# Patient Record
Sex: Female | Born: 1935
Health system: Southern US, Community
[De-identification: ages and names within clinical notes are randomized; demographics above are authoritative.]

## PROBLEM LIST (undated history)

## (undated) DIAGNOSIS — Z86018 Personal history of other benign neoplasm: Secondary | ICD-10-CM

## (undated) DIAGNOSIS — E785 Hyperlipidemia, unspecified: Secondary | ICD-10-CM

## (undated) DIAGNOSIS — M898X9 Other specified disorders of bone, unspecified site: Secondary | ICD-10-CM

## (undated) DIAGNOSIS — M899 Disorder of bone, unspecified: Secondary | ICD-10-CM

## (undated) DIAGNOSIS — S32020A Wedge compression fracture of second lumbar vertebra, initial encounter for closed fracture: Secondary | ICD-10-CM

## (undated) DIAGNOSIS — E7800 Pure hypercholesterolemia, unspecified: Secondary | ICD-10-CM

## (undated) DIAGNOSIS — M1711 Unilateral primary osteoarthritis, right knee: Secondary | ICD-10-CM

## (undated) DIAGNOSIS — I2510 Atherosclerotic heart disease of native coronary artery without angina pectoris: Secondary | ICD-10-CM

## (undated) DIAGNOSIS — D649 Anemia, unspecified: Secondary | ICD-10-CM

## (undated) DIAGNOSIS — I10 Essential (primary) hypertension: Secondary | ICD-10-CM

## (undated) DIAGNOSIS — M810 Age-related osteoporosis without current pathological fracture: Secondary | ICD-10-CM

## (undated) DIAGNOSIS — H269 Unspecified cataract: Secondary | ICD-10-CM

## (undated) DIAGNOSIS — S32010A Wedge compression fracture of first lumbar vertebra, initial encounter for closed fracture: Secondary | ICD-10-CM

## (undated) DIAGNOSIS — E119 Type 2 diabetes mellitus without complications: Secondary | ICD-10-CM

## (undated) HISTORY — DX: Unspecified cataract: H26.9

## (undated) HISTORY — DX: Hypercalcemia: E83.52

## (undated) HISTORY — DX: Wedge compression fracture of second lumbar vertebra, initial encounter for closed fracture: S32.020A

## (undated) HISTORY — DX: Multiple myeloma not having achieved remission: C90.00

## (undated) HISTORY — PX: ECTOPIC PREGNANCY SURGERY: SHX613

## (undated) HISTORY — DX: Unilateral primary osteoarthritis, right knee: M17.11

## (undated) HISTORY — DX: Pure hypercholesterolemia, unspecified: E78.00

## (undated) HISTORY — DX: Disorder of bone, unspecified: M89.9

## (undated) HISTORY — DX: Age-related osteoporosis without current pathological fracture: M81.0

## (undated) HISTORY — DX: Other specified disorders of bone, unspecified site: M89.8X9

## (undated) HISTORY — DX: Anemia, unspecified: D64.9

## (undated) HISTORY — DX: Personal history of other benign neoplasm: Z86.018

## (undated) HISTORY — DX: Atherosclerotic heart disease of native coronary artery without angina pectoris: I25.10

## (undated) HISTORY — DX: Essential (primary) hypertension: I10

## (undated) HISTORY — DX: Hyperlipidemia, unspecified: E78.5

## (undated) HISTORY — DX: Type 2 diabetes mellitus without complications: E11.9

## (undated) HISTORY — DX: Wedge compression fracture of first lumbar vertebra, initial encounter for closed fracture: S32.010A

## (undated) MED FILL — Iron Sucrose Inj 20 MG/ML (Fe Equiv): INTRAVENOUS | Qty: 10 | Status: AC

---

## 2008-01-14 ENCOUNTER — Ambulatory Visit: Payer: Self-pay | Admitting: Family Medicine

## 2008-01-14 IMAGING — CR DG CHEST 2V
1 series · 2 of 2 positions shown · non-contrast
Comparison: none

REASON FOR EXAM: Pneumonia
COMMENTS:

[Series 1: view not recorded · 0.17mm/px · 2 of 2 slices shown]
[im 1/2]
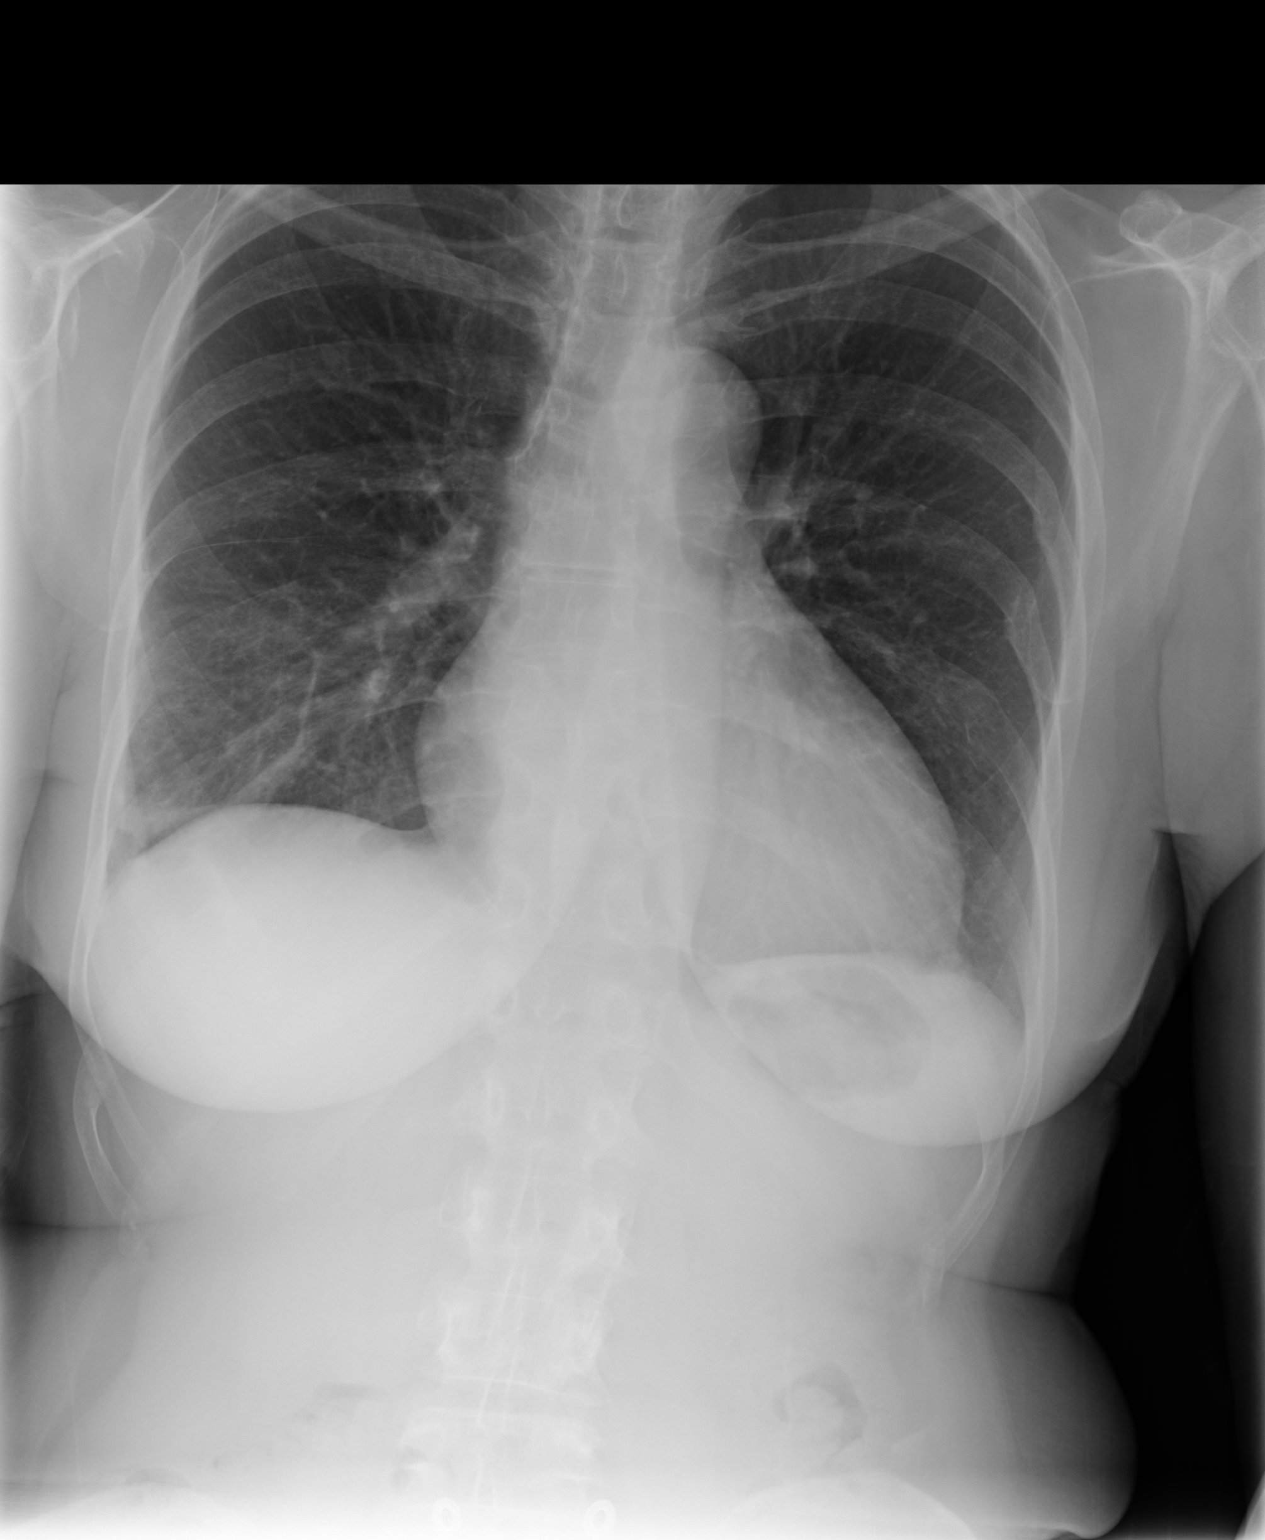
[im 2/2]
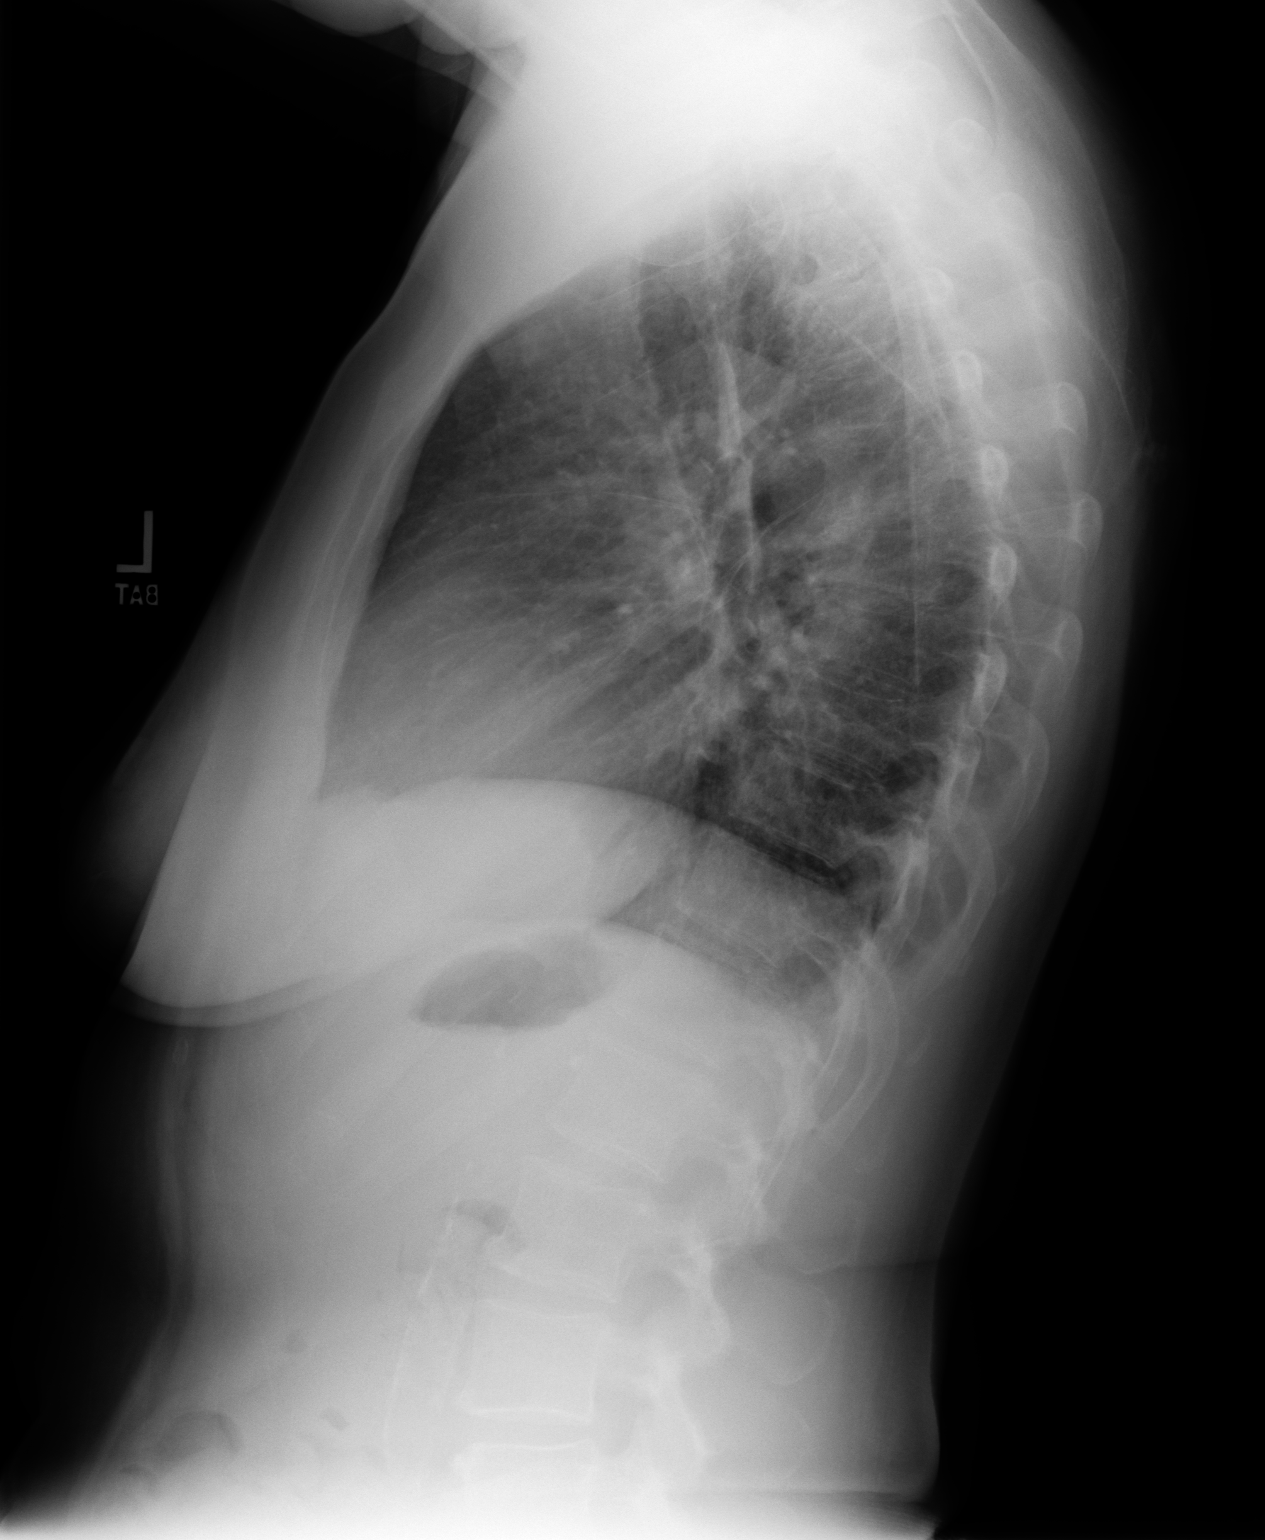

[2 of 2 positions shown; findings below may reference images not displayed]

PROCEDURE:     MDR - MDR CHEST PA(OR AP) AND LATERAL  - [DATE]  [DATE]

RESULT:     There is a minimal increase in density at the RIGHT base
compatible with atelectasis or minimal pneumonia. The LEFT lung field is
clear. The heart appears mildly enlarged. There is no interstitial or
pulmonary edema. Old appearing rib fracture deformities are noted on the
LEFT.
IMPRESSION: 1.  There is minimal atelectasis or infiltrate at the RIGHT base. The lung
fields otherwise are clear.
2.  Mild cardiomegaly.
3.  No pleural effusion or pulmonary edema is seen.
4.  There is deformity of the sixth and seventh, LEFT ribs compatible with
old rib fracture deformities.

## 2008-03-03 ENCOUNTER — Ambulatory Visit: Payer: Self-pay | Admitting: Family Medicine

## 2008-03-03 IMAGING — CR DG CHEST 2V
1 series · 2 of 2 positions shown · non-contrast
Comparison: none

REASON FOR EXAM: pneumonia
COMMENTS:

[Series 1: view not recorded · 0.17mm/px · 2 of 2 slices shown]
[im 1/2]
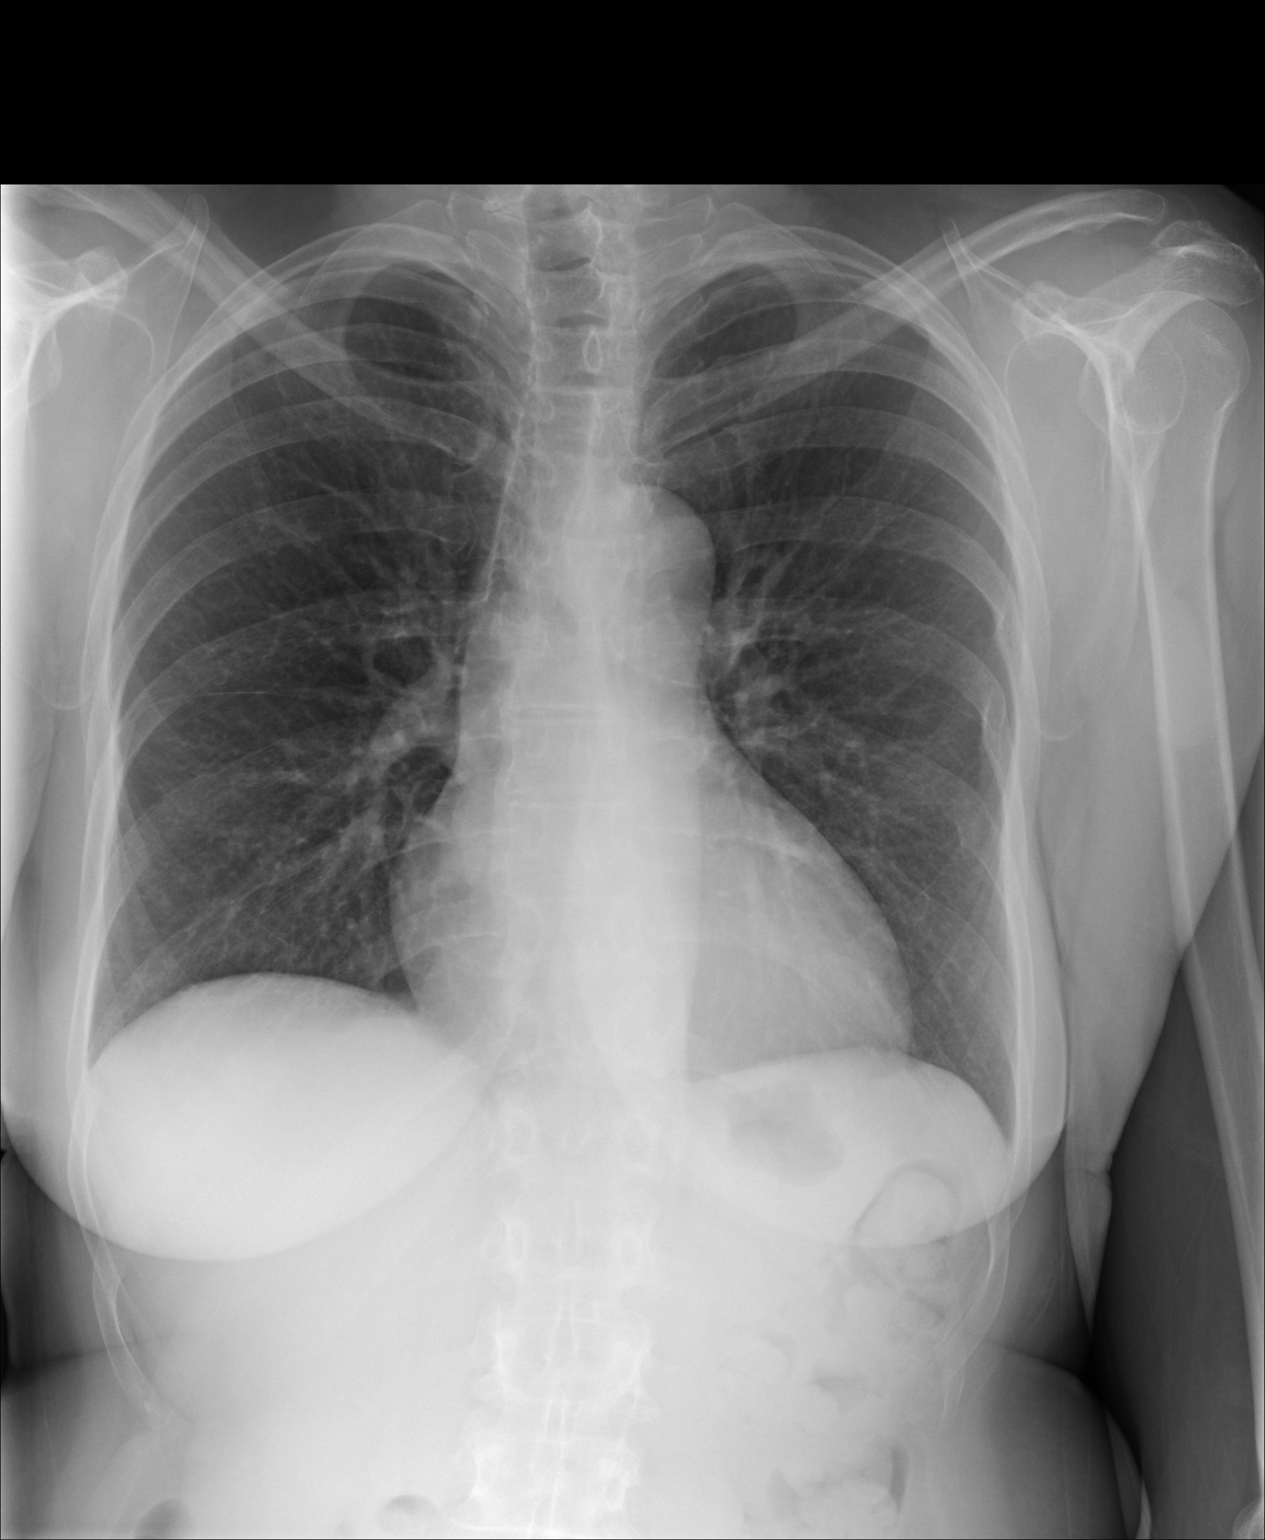
[im 2/2]
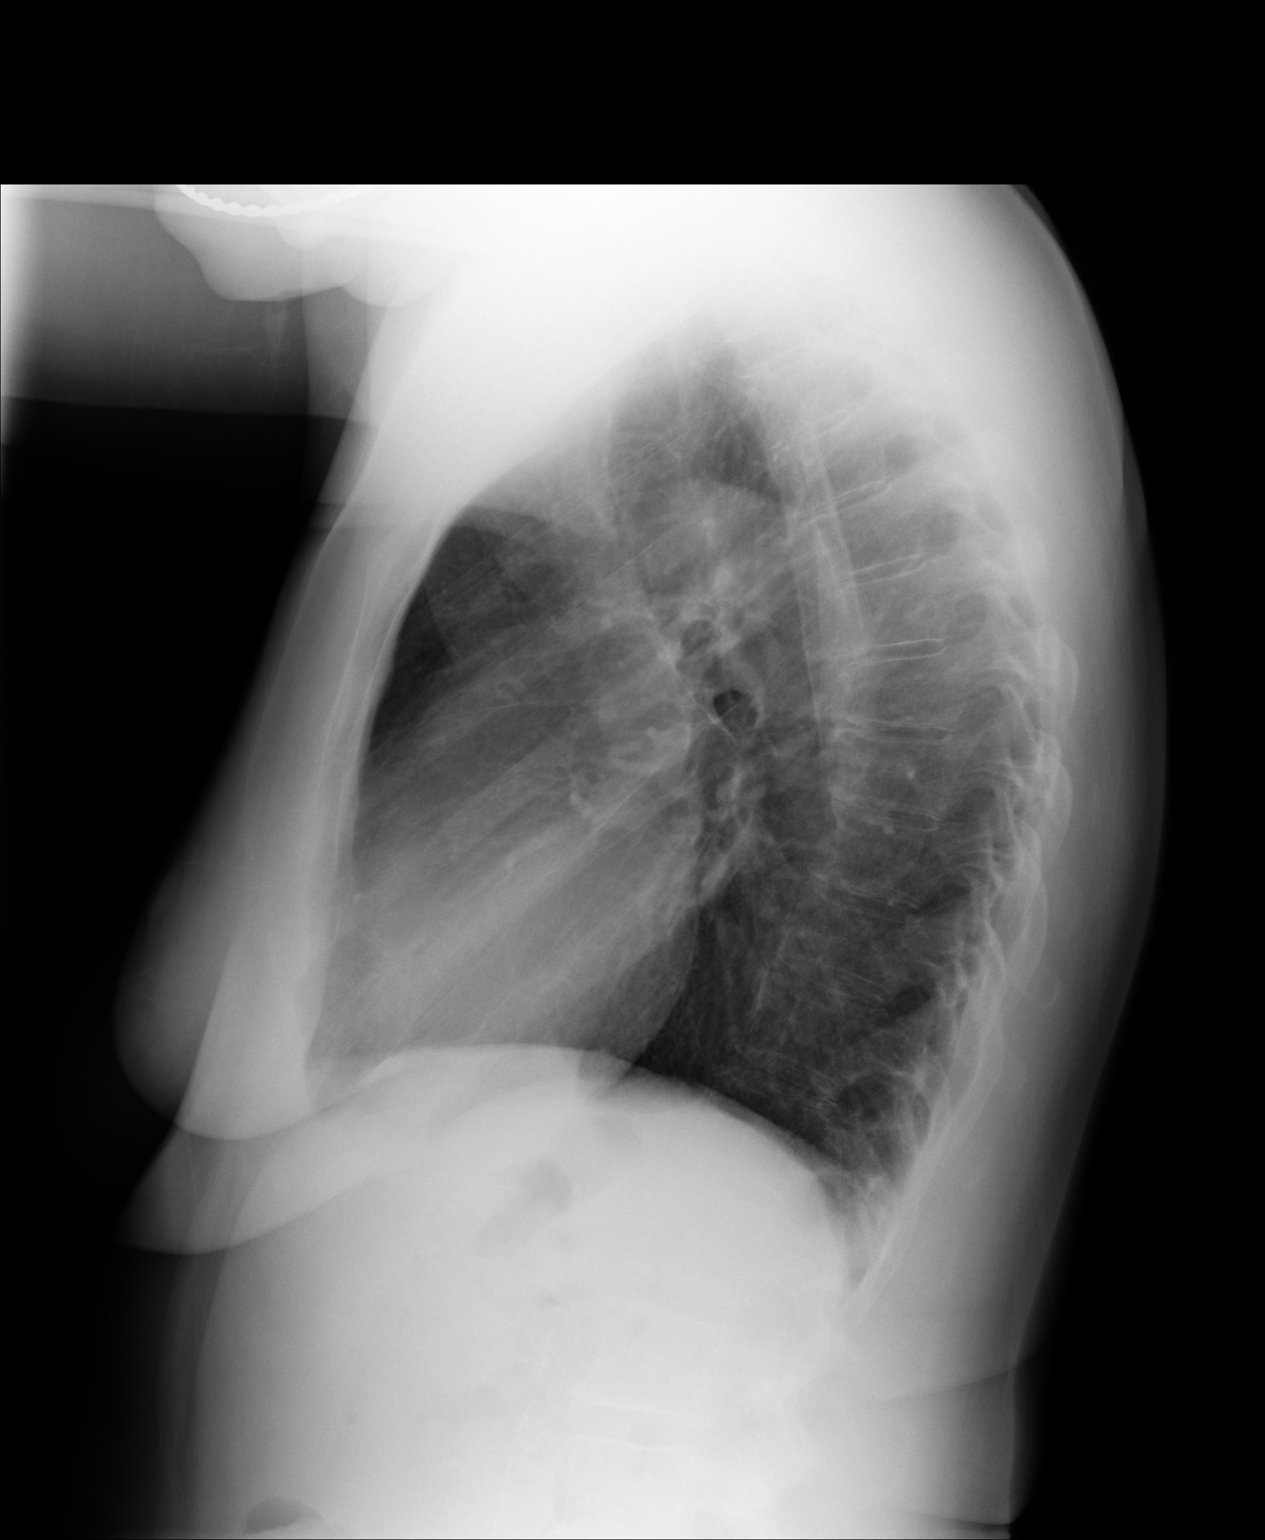

[2 of 2 positions shown; findings below may reference images not displayed]

PROCEDURE:     MDR - MDR CHEST PA(OR AP) AND LATERAL  - [DATE]  [DATE]

RESULT:     Comparison is made to the study [DATE].

The lungs are mildly hyperinflated. There is no focal infiltrate. Old rib
deformity on the LEFT is present. The heart is normal in size. The pulmonary
vascularity is not engorged.
IMPRESSION: I do not see evidence of pneumonia nor other acute cardiopulmonary
abnormality.

## 2008-03-17 DIAGNOSIS — Z8701 Personal history of pneumonia (recurrent): Secondary | ICD-10-CM

## 2008-03-17 HISTORY — DX: Personal history of pneumonia (recurrent): Z87.01

## 2009-03-17 HISTORY — PX: COLONOSCOPY: SHX174

## 2011-03-19 DIAGNOSIS — J209 Acute bronchitis, unspecified: Secondary | ICD-10-CM | POA: Diagnosis not present

## 2011-05-05 DIAGNOSIS — M7150 Other bursitis, not elsewhere classified, unspecified site: Secondary | ICD-10-CM | POA: Diagnosis not present

## 2011-06-25 DIAGNOSIS — M6281 Muscle weakness (generalized): Secondary | ICD-10-CM | POA: Diagnosis not present

## 2011-06-25 DIAGNOSIS — M25659 Stiffness of unspecified hip, not elsewhere classified: Secondary | ICD-10-CM | POA: Diagnosis not present

## 2011-06-25 DIAGNOSIS — M25559 Pain in unspecified hip: Secondary | ICD-10-CM | POA: Diagnosis not present

## 2011-06-25 DIAGNOSIS — M76899 Other specified enthesopathies of unspecified lower limb, excluding foot: Secondary | ICD-10-CM | POA: Diagnosis not present

## 2011-07-08 DIAGNOSIS — E785 Hyperlipidemia, unspecified: Secondary | ICD-10-CM | POA: Diagnosis not present

## 2011-07-08 DIAGNOSIS — I10 Essential (primary) hypertension: Secondary | ICD-10-CM | POA: Diagnosis not present

## 2011-07-08 DIAGNOSIS — E119 Type 2 diabetes mellitus without complications: Secondary | ICD-10-CM | POA: Diagnosis not present

## 2011-07-11 DIAGNOSIS — M6281 Muscle weakness (generalized): Secondary | ICD-10-CM | POA: Diagnosis not present

## 2011-07-11 DIAGNOSIS — M25659 Stiffness of unspecified hip, not elsewhere classified: Secondary | ICD-10-CM | POA: Diagnosis not present

## 2011-07-11 DIAGNOSIS — M76899 Other specified enthesopathies of unspecified lower limb, excluding foot: Secondary | ICD-10-CM | POA: Diagnosis not present

## 2011-07-11 DIAGNOSIS — M25559 Pain in unspecified hip: Secondary | ICD-10-CM | POA: Diagnosis not present

## 2011-07-14 DIAGNOSIS — M76899 Other specified enthesopathies of unspecified lower limb, excluding foot: Secondary | ICD-10-CM | POA: Diagnosis not present

## 2011-07-14 DIAGNOSIS — M25559 Pain in unspecified hip: Secondary | ICD-10-CM | POA: Diagnosis not present

## 2011-07-14 DIAGNOSIS — M6281 Muscle weakness (generalized): Secondary | ICD-10-CM | POA: Diagnosis not present

## 2011-07-14 DIAGNOSIS — M25659 Stiffness of unspecified hip, not elsewhere classified: Secondary | ICD-10-CM | POA: Diagnosis not present

## 2011-07-17 DIAGNOSIS — M25659 Stiffness of unspecified hip, not elsewhere classified: Secondary | ICD-10-CM | POA: Diagnosis not present

## 2011-07-17 DIAGNOSIS — M76899 Other specified enthesopathies of unspecified lower limb, excluding foot: Secondary | ICD-10-CM | POA: Diagnosis not present

## 2011-07-17 DIAGNOSIS — M6281 Muscle weakness (generalized): Secondary | ICD-10-CM | POA: Diagnosis not present

## 2011-07-21 DIAGNOSIS — M6281 Muscle weakness (generalized): Secondary | ICD-10-CM | POA: Diagnosis not present

## 2011-07-21 DIAGNOSIS — M76899 Other specified enthesopathies of unspecified lower limb, excluding foot: Secondary | ICD-10-CM | POA: Diagnosis not present

## 2011-07-21 DIAGNOSIS — M25659 Stiffness of unspecified hip, not elsewhere classified: Secondary | ICD-10-CM | POA: Diagnosis not present

## 2011-07-24 DIAGNOSIS — M25659 Stiffness of unspecified hip, not elsewhere classified: Secondary | ICD-10-CM | POA: Diagnosis not present

## 2011-07-24 DIAGNOSIS — M25559 Pain in unspecified hip: Secondary | ICD-10-CM | POA: Diagnosis not present

## 2011-07-24 DIAGNOSIS — M6281 Muscle weakness (generalized): Secondary | ICD-10-CM | POA: Diagnosis not present

## 2011-07-24 DIAGNOSIS — M76899 Other specified enthesopathies of unspecified lower limb, excluding foot: Secondary | ICD-10-CM | POA: Diagnosis not present

## 2011-07-29 DIAGNOSIS — M6281 Muscle weakness (generalized): Secondary | ICD-10-CM | POA: Diagnosis not present

## 2011-07-29 DIAGNOSIS — M25659 Stiffness of unspecified hip, not elsewhere classified: Secondary | ICD-10-CM | POA: Diagnosis not present

## 2011-07-29 DIAGNOSIS — M76899 Other specified enthesopathies of unspecified lower limb, excluding foot: Secondary | ICD-10-CM | POA: Diagnosis not present

## 2011-07-29 DIAGNOSIS — M25559 Pain in unspecified hip: Secondary | ICD-10-CM | POA: Diagnosis not present

## 2011-08-01 DIAGNOSIS — M6281 Muscle weakness (generalized): Secondary | ICD-10-CM | POA: Diagnosis not present

## 2011-08-01 DIAGNOSIS — M25559 Pain in unspecified hip: Secondary | ICD-10-CM | POA: Diagnosis not present

## 2011-08-01 DIAGNOSIS — M76899 Other specified enthesopathies of unspecified lower limb, excluding foot: Secondary | ICD-10-CM | POA: Diagnosis not present

## 2011-08-01 DIAGNOSIS — M25659 Stiffness of unspecified hip, not elsewhere classified: Secondary | ICD-10-CM | POA: Diagnosis not present

## 2011-08-06 DIAGNOSIS — M76899 Other specified enthesopathies of unspecified lower limb, excluding foot: Secondary | ICD-10-CM | POA: Diagnosis not present

## 2011-08-06 DIAGNOSIS — M6281 Muscle weakness (generalized): Secondary | ICD-10-CM | POA: Diagnosis not present

## 2012-02-19 DIAGNOSIS — Z1231 Encounter for screening mammogram for malignant neoplasm of breast: Secondary | ICD-10-CM | POA: Diagnosis not present

## 2012-02-19 DIAGNOSIS — Z803 Family history of malignant neoplasm of breast: Secondary | ICD-10-CM | POA: Diagnosis not present

## 2012-03-05 DIAGNOSIS — E785 Hyperlipidemia, unspecified: Secondary | ICD-10-CM | POA: Diagnosis not present

## 2012-03-05 DIAGNOSIS — E119 Type 2 diabetes mellitus without complications: Secondary | ICD-10-CM | POA: Diagnosis not present

## 2012-03-05 DIAGNOSIS — I10 Essential (primary) hypertension: Secondary | ICD-10-CM | POA: Diagnosis not present

## 2012-03-17 HISTORY — PX: CATARACT EXTRACTION: SUR2

## 2012-06-21 DIAGNOSIS — J209 Acute bronchitis, unspecified: Secondary | ICD-10-CM | POA: Diagnosis not present

## 2012-06-21 DIAGNOSIS — J329 Chronic sinusitis, unspecified: Secondary | ICD-10-CM | POA: Diagnosis not present

## 2012-08-25 DIAGNOSIS — I10 Essential (primary) hypertension: Secondary | ICD-10-CM | POA: Diagnosis not present

## 2012-08-25 DIAGNOSIS — E119 Type 2 diabetes mellitus without complications: Secondary | ICD-10-CM | POA: Diagnosis not present

## 2012-08-25 DIAGNOSIS — E785 Hyperlipidemia, unspecified: Secondary | ICD-10-CM | POA: Diagnosis not present

## 2012-08-25 DIAGNOSIS — R635 Abnormal weight gain: Secondary | ICD-10-CM | POA: Diagnosis not present

## 2013-04-14 DIAGNOSIS — E785 Hyperlipidemia, unspecified: Secondary | ICD-10-CM | POA: Diagnosis not present

## 2013-04-14 DIAGNOSIS — I10 Essential (primary) hypertension: Secondary | ICD-10-CM | POA: Diagnosis not present

## 2013-04-14 DIAGNOSIS — E119 Type 2 diabetes mellitus without complications: Secondary | ICD-10-CM | POA: Diagnosis not present

## 2013-04-18 DIAGNOSIS — E11329 Type 2 diabetes mellitus with mild nonproliferative diabetic retinopathy without macular edema: Secondary | ICD-10-CM | POA: Diagnosis not present

## 2013-04-18 DIAGNOSIS — IMO0001 Reserved for inherently not codable concepts without codable children: Secondary | ICD-10-CM | POA: Diagnosis not present

## 2013-07-22 DIAGNOSIS — H2510 Age-related nuclear cataract, unspecified eye: Secondary | ICD-10-CM | POA: Diagnosis not present

## 2013-07-22 DIAGNOSIS — H269 Unspecified cataract: Secondary | ICD-10-CM | POA: Diagnosis not present

## 2013-12-28 DIAGNOSIS — E119 Type 2 diabetes mellitus without complications: Secondary | ICD-10-CM | POA: Diagnosis not present

## 2013-12-28 DIAGNOSIS — I10 Essential (primary) hypertension: Secondary | ICD-10-CM | POA: Diagnosis not present

## 2013-12-28 DIAGNOSIS — E785 Hyperlipidemia, unspecified: Secondary | ICD-10-CM | POA: Diagnosis not present

## 2013-12-28 DIAGNOSIS — E784 Other hyperlipidemia: Secondary | ICD-10-CM | POA: Diagnosis not present

## 2013-12-28 DIAGNOSIS — Z23 Encounter for immunization: Secondary | ICD-10-CM | POA: Diagnosis not present

## 2014-03-20 DIAGNOSIS — I10 Essential (primary) hypertension: Secondary | ICD-10-CM | POA: Diagnosis not present

## 2014-03-20 DIAGNOSIS — E7800 Pure hypercholesterolemia, unspecified: Secondary | ICD-10-CM | POA: Insufficient documentation

## 2014-03-20 DIAGNOSIS — R079 Chest pain, unspecified: Secondary | ICD-10-CM | POA: Insufficient documentation

## 2014-03-20 DIAGNOSIS — Z7984 Long term (current) use of oral hypoglycemic drugs: Secondary | ICD-10-CM | POA: Insufficient documentation

## 2014-03-20 DIAGNOSIS — Z79899 Other long term (current) drug therapy: Secondary | ICD-10-CM | POA: Diagnosis not present

## 2014-03-20 DIAGNOSIS — E119 Type 2 diabetes mellitus without complications: Secondary | ICD-10-CM | POA: Diagnosis not present

## 2014-03-20 DIAGNOSIS — Z880 Allergy status to penicillin: Secondary | ICD-10-CM | POA: Diagnosis not present

## 2014-03-20 DIAGNOSIS — Z7982 Long term (current) use of aspirin: Secondary | ICD-10-CM | POA: Diagnosis not present

## 2014-03-21 DIAGNOSIS — R079 Chest pain, unspecified: Secondary | ICD-10-CM | POA: Diagnosis not present

## 2014-03-21 DIAGNOSIS — E119 Type 2 diabetes mellitus without complications: Secondary | ICD-10-CM | POA: Diagnosis not present

## 2014-03-21 DIAGNOSIS — I10 Essential (primary) hypertension: Secondary | ICD-10-CM | POA: Diagnosis not present

## 2014-03-21 DIAGNOSIS — E780 Pure hypercholesterolemia: Secondary | ICD-10-CM | POA: Diagnosis not present

## 2014-03-22 DIAGNOSIS — I200 Unstable angina: Secondary | ICD-10-CM | POA: Diagnosis not present

## 2014-03-22 DIAGNOSIS — E119 Type 2 diabetes mellitus without complications: Secondary | ICD-10-CM | POA: Diagnosis not present

## 2014-03-22 DIAGNOSIS — E780 Pure hypercholesterolemia: Secondary | ICD-10-CM | POA: Diagnosis not present

## 2014-03-22 DIAGNOSIS — E785 Hyperlipidemia, unspecified: Secondary | ICD-10-CM | POA: Diagnosis not present

## 2014-03-22 DIAGNOSIS — R079 Chest pain, unspecified: Secondary | ICD-10-CM | POA: Diagnosis not present

## 2014-03-22 DIAGNOSIS — Z8249 Family history of ischemic heart disease and other diseases of the circulatory system: Secondary | ICD-10-CM | POA: Diagnosis not present

## 2014-03-22 DIAGNOSIS — I10 Essential (primary) hypertension: Secondary | ICD-10-CM | POA: Diagnosis not present

## 2014-03-30 DIAGNOSIS — E784 Other hyperlipidemia: Secondary | ICD-10-CM | POA: Diagnosis not present

## 2014-03-30 DIAGNOSIS — E119 Type 2 diabetes mellitus without complications: Secondary | ICD-10-CM | POA: Diagnosis not present

## 2014-03-30 DIAGNOSIS — I10 Essential (primary) hypertension: Secondary | ICD-10-CM | POA: Diagnosis not present

## 2014-04-06 DIAGNOSIS — Z09 Encounter for follow-up examination after completed treatment for conditions other than malignant neoplasm: Secondary | ICD-10-CM | POA: Diagnosis not present

## 2014-11-06 ENCOUNTER — Ambulatory Visit: Payer: Self-pay | Admitting: Family Medicine

## 2014-12-30 ENCOUNTER — Other Ambulatory Visit: Payer: Self-pay | Admitting: Family Medicine

## 2015-01-03 ENCOUNTER — Ambulatory Visit (INDEPENDENT_AMBULATORY_CARE_PROVIDER_SITE_OTHER): Payer: Medicare Other | Admitting: Family Medicine

## 2015-01-03 ENCOUNTER — Encounter: Payer: Self-pay | Admitting: Family Medicine

## 2015-01-03 VITALS — BP 120/70 | HR 70 | Ht 66.0 in | Wt 160.0 lb

## 2015-01-03 DIAGNOSIS — I10 Essential (primary) hypertension: Secondary | ICD-10-CM | POA: Diagnosis not present

## 2015-01-03 DIAGNOSIS — E119 Type 2 diabetes mellitus without complications: Secondary | ICD-10-CM | POA: Diagnosis not present

## 2015-01-03 DIAGNOSIS — Z23 Encounter for immunization: Secondary | ICD-10-CM | POA: Diagnosis not present

## 2015-01-03 DIAGNOSIS — E785 Hyperlipidemia, unspecified: Secondary | ICD-10-CM | POA: Diagnosis not present

## 2015-01-03 MED ORDER — METFORMIN HCL 1000 MG PO TABS: 1000.0000 mg | ORAL_TABLET | Freq: Two times a day (BID) | ORAL | Status: DC

## 2015-01-03 MED ORDER — LISINOPRIL 5 MG PO TABS: 5.0000 mg | ORAL_TABLET | Freq: Every day | ORAL | Status: DC

## 2015-01-03 MED ORDER — LOVASTATIN 40 MG PO TABS
40.0000 mg | ORAL_TABLET | Freq: Every day | ORAL | Status: DC
Start: 2015-01-03 — End: 2015-08-09

## 2015-01-03 MED ORDER — GLIPIZIDE ER 5 MG PO TB24: 5.0000 mg | ORAL_TABLET | Freq: Every day | ORAL | Status: DC

## 2015-01-03 NOTE — Progress Notes (Signed)
Name: Cristina Morgan   MRN: [id number]    DOB: 07/14/1934   Date:01/03/2015       Progress Note  Subjective  Chief Complaint  Chief Complaint  Patient presents with  . Diabetes  . Hypertension  . Hyperlipidemia    Diabetes She presents for her follow-up diabetic visit. She has type 2 diabetes mellitus. Pertinent negatives for hypoglycemia include no confusion, dizziness, headaches, mood changes, nervousness/anxiousness, pallor, seizures, sleepiness, speech difficulty, sweats or tremors. Pertinent negatives for diabetes include no blurred vision, no chest pain, no fatigue, no foot paresthesias, no foot ulcerations, no polydipsia, no polyphagia, no polyuria, no visual change, no weakness and no weight loss. There are no hypoglycemic complications. Symptoms are stable. Pertinent negatives for diabetic complications include no autonomic neuropathy, CVA, heart disease, impotence, nephropathy, peripheral neuropathy, PVD or retinopathy. Risk factors for coronary artery disease include hypertension, dyslipidemia, diabetes mellitus and post-menopausal. Current diabetic treatment includes oral agent (dual therapy). She is compliant with treatment all of the time. Her weight is stable. She is following a generally healthy diet. She participates in exercise three times a week. Her breakfast blood glucose is taken between 8-9 am. Her breakfast blood glucose range is generally 110-130 mg/dl. An ACE inhibitor/angiotensin II receptor blocker is being taken. She does not see a podiatrist.Eye exam is current.  Hypertension This is a chronic problem. The current episode started more than 1 year ago. The problem is controlled. Pertinent negatives include no anxiety, blurred vision, chest pain, headaches, malaise/fatigue, neck pain, orthopnea, palpitations, peripheral edema, PND, shortness of breath or sweats. There are no associated agents to hypertension. Risk factors for coronary artery disease include dyslipidemia  and diabetes mellitus. Past treatments include ACE inhibitors. The current treatment provides mild improvement. There are no compliance problems.  There is no history of angina, kidney disease, CAD/MI, CVA, heart failure, left ventricular hypertrophy, PVD, renovascular disease or retinopathy. There is no history of chronic renal disease.  Hyperlipidemia This is a chronic problem. The problem is controlled. Recent lipid tests were reviewed and are normal. She has no history of chronic renal disease, diabetes, hypothyroidism, liver disease, obesity or nephrotic syndrome. There are no known factors aggravating her hyperlipidemia. Pertinent negatives include no chest pain or shortness of breath. Current antihyperlipidemic treatment includes statins. The current treatment provides mild improvement of lipids. There are no compliance problems.  Risk factors for coronary artery disease include hypertension.    No problem-specific assessment & plan notes found for this encounter.   Past Medical History  Diagnosis Date  . Diabetes mellitus without complication ([place])   . Hyperlipidemia   . Hypertension     Past Surgical History  Procedure Laterality Date  . Ectopic pregnancy surgery    . Colonoscopy  2011    cleared for 5 yrs- [facility]  . Cataract extraction Bilateral 2014    Family History  Problem Relation Age of Onset  . Cancer Mother   . Diabetes Father   . Heart disease Father     Social History   Social History  . Marital Status: Married    Spouse Name: N/A  . Number of Children: N/A  . Years of Education: N/A   Occupational History  . Not on file.   Social History Main Topics  . Smoking status: Never Smoker   . Smokeless tobacco: Not on file  . Alcohol Use: No  . Drug Use: No  . Sexual Activity: No   Other Topics Concern  . Not on  file   Social History Narrative  . No narrative on file    Allergies  Allergen Reactions  . Penicillins Other (See Comments)     Review  of Systems  Unable to perform ROS Constitutional: Negative for weight loss, malaise/fatigue and fatigue.  Eyes: Negative for blurred vision.  Respiratory: Negative for shortness of breath.   Cardiovascular: Negative for chest pain, palpitations, orthopnea and PND.  Genitourinary: Negative for impotence.  Musculoskeletal: Positive for joint pain. Negative for neck pain.  Skin: Negative for pallor.  Neurological: Negative for dizziness, tremors, seizures, speech difficulty, weakness and headaches.  Endo/Heme/Allergies: Negative for polydipsia and polyphagia.  Psychiatric/Behavioral: Negative for confusion. The patient is not nervous/anxious.   All other systems reviewed and are negative.    Objective  Filed Vitals:   01/03/15 0847  BP: 120/70  Pulse: 70  Height: 5\' 6"  (1.676 m)  Weight: 160 lb (72.576 kg)    Physical Exam  Constitutional: She is well-developed, well-nourished, and in no distress. No distress.  HENT:  Head: Normocephalic and atraumatic.  Right Ear: External ear normal.  Left Ear: External ear normal.  Nose: Nose normal.  Mouth/Throat: Oropharynx is clear and moist.  Eyes: Conjunctivae and EOM are normal. Pupils are equal, round, and reactive to light. Right eye exhibits no discharge. Left eye exhibits no discharge.  Neck: Normal range of motion. Neck supple. No JVD present. No thyromegaly present.  Cardiovascular: Normal rate, regular rhythm, normal heart sounds and intact distal pulses.  Exam reveals no gallop and no friction rub.   No murmur heard. Pulmonary/Chest: Effort normal and breath sounds normal.  Abdominal: Soft. Bowel sounds are normal. She exhibits no mass. There is no tenderness. There is no guarding.  Musculoskeletal: Normal range of motion. She exhibits no edema.  Lymphadenopathy:    She has no cervical adenopathy.  Neurological: She is alert. She has normal reflexes.  Skin: Skin is warm and dry. She is not diaphoretic.  Psychiatric: Mood and  affect normal.      Assessment & Plan  Problem List Items Addressed This Visit    None    Visit Diagnoses    Essential hypertension    -  Primary    Relevant Medications    aspirin 81 MG chewable tablet    lovastatin (MEVACOR) 40 MG tablet    lisinopril (PRINIVIL,ZESTRIL) 5 MG tablet    Other Relevant Orders    Renal Function Panel    Type 2 diabetes mellitus without complication, without long-term current use of insulin (HCC)        Relevant Medications    aspirin 81 MG chewable tablet    metFORMIN (GLUCOPHAGE) 1000 MG tablet    lovastatin (MEVACOR) 40 MG tablet    lisinopril (PRINIVIL,ZESTRIL) 5 MG tablet    glipiZIDE (GLUCOTROL XL) 5 MG 24 hr tablet    Other Relevant Orders    HgB A1c    Hyperlipidemia        Relevant Medications    aspirin 81 MG chewable tablet    lovastatin (MEVACOR) 40 MG tablet    lisinopril (PRINIVIL,ZESTRIL) 5 MG tablet    Other Relevant Orders    Lipid Profile    Hepatic function panel    Need for influenza vaccination        Relevant Orders    Flu Vaccine QUAD 36+ mos PF IM (Fluarix & Fluzone Quad PF) (Completed)         Dr. [facility]  01/03/2015

## 2015-01-04 LAB — RENAL FUNCTION PANEL
ALBUMIN: 4.1 g/dL (ref 3.5–4.8)
BUN/Creatinine Ratio: 18 (ref 11–26)
BUN: 13 mg/dL (ref 8–27)
CALCIUM: 9.3 mg/dL (ref 8.7–10.3)
CHLORIDE: 100 mmol/L (ref 97–106)
CO2: 25 mmol/L (ref 18–29)
Creatinine, Ser: 0.72 mg/dL (ref 0.57–1.00)
GFR calc non Af Amer: 80 mL/min/{1.73_m2} (ref >59)
GFR, EST AFRICAN AMERICAN: 93 mL/min/{1.73_m2} (ref >59)
GLUCOSE: 157 mg/dL — AB (ref 65–99)
PHOSPHORUS: 3.5 mg/dL (ref 2.5–4.5)
POTASSIUM: 4.6 mmol/L (ref 3.5–5.2)
Sodium: 139 mmol/L (ref 136–144)

## 2015-01-04 LAB — HEMOGLOBIN A1C
ESTIMATED AVERAGE GLUCOSE: 166 mg/dL
HEMOGLOBIN A1C: 7.4 % — AB (ref 4.8–5.6)

## 2015-01-04 LAB — LIPID PANEL
CHOLESTEROL TOTAL: 185 mg/dL (ref 100–199)
Chol/HDL Ratio: 2.5 ratio units (ref 0.0–4.4)
HDL: 73 mg/dL (ref >39)
LDL Calculated: 100 mg/dL — ABNORMAL HIGH (ref 0–99)
Triglycerides: 62 mg/dL (ref 0–149)
VLDL CHOLESTEROL CAL: 12 mg/dL (ref 5–40)

## 2015-01-04 LAB — HEPATIC FUNCTION PANEL
ALK PHOS: 59 IU/L (ref 39–117)
ALT: 9 IU/L (ref 0–32)
AST: 17 IU/L (ref 0–40)
BILIRUBIN TOTAL: 0.4 mg/dL (ref 0.0–1.2)
BILIRUBIN, DIRECT: 0.14 mg/dL (ref 0.00–0.40)
TOTAL PROTEIN: 6.9 g/dL (ref 6.0–8.5)

## 2015-03-21 ENCOUNTER — Ambulatory Visit (INDEPENDENT_AMBULATORY_CARE_PROVIDER_SITE_OTHER): Payer: Medicare Other | Admitting: Family Medicine

## 2015-03-21 ENCOUNTER — Encounter: Payer: Self-pay | Admitting: Family Medicine

## 2015-03-21 VITALS — BP 120/64 | HR 80 | Temp 98.2°F | Ht 66.0 in | Wt 166.0 lb

## 2015-03-21 DIAGNOSIS — J0100 Acute maxillary sinusitis, unspecified: Secondary | ICD-10-CM | POA: Diagnosis not present

## 2015-03-21 DIAGNOSIS — J40 Bronchitis, not specified as acute or chronic: Secondary | ICD-10-CM | POA: Diagnosis not present

## 2015-03-21 MED ORDER — GUAIFENESIN-CODEINE 100-10 MG/5ML PO SOLN: 5.0000 mL | Freq: Three times a day (TID) | ORAL | Status: DC | PRN

## 2015-03-21 MED ORDER — DOXYCYCLINE HYCLATE 100 MG PO TABS: 100.0000 mg | ORAL_TABLET | Freq: Two times a day (BID) | ORAL | Status: DC

## 2015-03-21 NOTE — Progress Notes (Signed)
Name: Cristina Morgan   MRN: [id number]    DOB: 12-06-1934   Date:03/21/2015       Progress Note  Subjective  Chief Complaint  Chief Complaint  Patient presents with  . Sinusitis    cough and cong, runny nose, draiange    Sinusitis This is a new problem. The current episode started in the past 7 days. The problem has been gradually worsening since onset. There has been no fever. Associated symptoms include congestion, coughing and sinus pressure. Pertinent negatives include no chills, diaphoresis, ear pain, headaches, hoarse voice, neck pain, shortness of breath, sneezing, sore throat or swollen glands. Past treatments include nothing. The treatment provided no relief.    No problem-specific assessment & plan notes found for this encounter.   Past Medical History  Diagnosis Date  . Diabetes mellitus without complication ([place])   . Hyperlipidemia   . Hypertension     Past Surgical History  Procedure Laterality Date  . Ectopic pregnancy surgery    . Colonoscopy  2011    cleared for 5 yrs- [facility]  . Cataract extraction Bilateral 2014    Family History  Problem Relation Age of Onset  . Cancer Mother   . Diabetes Father   . Heart disease Father     Social History   Social History  . Marital Status: Married    Spouse Name: N/A  . Number of Children: N/A  . Years of Education: N/A   Occupational History  . Not on file.   Social History Main Topics  . Smoking status: Never Smoker   . Smokeless tobacco: Not on file  . Alcohol Use: No  . Drug Use: No  . Sexual Activity: No   Other Topics Concern  . Not on file   Social History Narrative    Allergies  Allergen Reactions  . Penicillins Other (See Comments)     Review of Systems  Constitutional: Negative for fever, chills, weight loss, malaise/fatigue and diaphoresis.  HENT: Positive for congestion and sinus pressure. Negative for ear discharge, ear pain, hoarse voice, sneezing and sore throat.   Eyes: Negative  for blurred vision.  Respiratory: Positive for cough. Negative for sputum production, shortness of breath and wheezing.   Cardiovascular: Negative for chest pain, palpitations and leg swelling.  Gastrointestinal: Negative for heartburn, nausea, abdominal pain, diarrhea, constipation, blood in stool and melena.  Genitourinary: Negative for dysuria, urgency, frequency and hematuria.  Musculoskeletal: Negative for myalgias, back pain, joint pain and neck pain.  Skin: Negative for rash.  Neurological: Negative for dizziness, tingling, sensory change, focal weakness and headaches.  Endo/Heme/Allergies: Negative for environmental allergies and polydipsia. Does not bruise/bleed easily.  Psychiatric/Behavioral: Negative for depression and suicidal ideas. The patient is not nervous/anxious and does not have insomnia.      Objective  Filed Vitals:   03/21/15 1104  BP: 120/64  Pulse: 80  Temp: 98.2 F (36.8 C)  TempSrc: Oral  Height: 5\' 6"  (1.676 m)  Weight: 166 lb (75.297 kg)    Physical Exam  Constitutional: She is well-developed, well-nourished, and in no distress. No distress.  HENT:  Head: Normocephalic and atraumatic.  Right Ear: External ear normal.  Left Ear: External ear normal.  Nose: Nose normal.  Mouth/Throat: Oropharynx is clear and moist.  Eyes: Conjunctivae and EOM are normal. Pupils are equal, round, and reactive to light. Right eye exhibits no discharge. Left eye exhibits no discharge.  Neck: Normal range of motion. Neck supple. No JVD present. No  thyromegaly present.  Cardiovascular: Normal rate, regular rhythm, normal heart sounds and intact distal pulses.  Exam reveals no gallop and no friction rub.   No murmur heard. Pulmonary/Chest: Effort normal and breath sounds normal.  Abdominal: Soft. Bowel sounds are normal. She exhibits no mass. There is no tenderness. There is no guarding.  Musculoskeletal: Normal range of motion. She exhibits no edema.  Lymphadenopathy:     She has no cervical adenopathy.  Neurological: She is alert.  Skin: Skin is warm and dry. She is not diaphoretic.  Psychiatric: Mood and affect normal.  Nursing note and vitals reviewed.     Assessment & Plan  Problem List Items Addressed This Visit    None    Visit Diagnoses    Acute maxillary sinusitis, recurrence not specified    -  Primary    Relevant Medications    doxycycline (VIBRA-TABS) 100 MG tablet    guaiFENesin-codeine 100-10 MG/5ML syrup    Bronchitis        Relevant Medications    doxycycline (VIBRA-TABS) 100 MG tablet    guaiFENesin-codeine 100-10 MG/5ML syrup         Dr. Kili Gr[facility]  03/21/2015

## 2015-03-22 ENCOUNTER — Ambulatory Visit: Payer: Self-pay | Admitting: Family Medicine

## 2015-07-05 ENCOUNTER — Other Ambulatory Visit: Payer: Self-pay | Admitting: Family Medicine

## 2015-07-09 ENCOUNTER — Ambulatory Visit (INDEPENDENT_AMBULATORY_CARE_PROVIDER_SITE_OTHER): Payer: Medicare Other | Admitting: Family Medicine

## 2015-07-09 ENCOUNTER — Encounter: Payer: Self-pay | Admitting: Family Medicine

## 2015-07-09 VITALS — BP 138/70 | HR 80 | Ht 66.0 in | Wt 161.0 lb

## 2015-07-09 DIAGNOSIS — S8001XA Contusion of right knee, initial encounter: Secondary | ICD-10-CM

## 2015-07-09 DIAGNOSIS — J189 Pneumonia, unspecified organism: Secondary | ICD-10-CM | POA: Insufficient documentation

## 2015-07-09 NOTE — Progress Notes (Signed)
Name: Cristina Morgan   MRN: [id number]    DOB: Aug 02, 1934   Date:07/09/2015       Progress Note  Subjective  Chief Complaint  Chief Complaint  Patient presents with  . Leg Pain    soreness around R) knee s/p fall- "feels better but still sore"    Leg Pain  The incident occurred 2 days ago. Incident location: at daughter's house. The injury mechanism was a fall. The pain is present in the right knee. The quality of the pain is described as aching. The pain is at a severity of 0/10. The pain has been intermittent since onset. Pertinent negatives include no inability to bear weight, loss of motion, loss of sensation, muscle weakness, numbness or tingling. The symptoms are aggravated by movement. She has tried acetaminophen for the symptoms. The treatment provided moderate relief.    No problem-specific assessment & plan notes found for this encounter.   Past Medical History  Diagnosis Date  . Diabetes mellitus without complication ([place])   . Hyperlipidemia   . Hypertension     Past Surgical History  Procedure Laterality Date  . Ectopic pregnancy surgery    . Colonoscopy  2011    cleared for 5 yrs- [facility]  . Cataract extraction Bilateral 2014    Family History  Problem Relation Age of Onset  . Cancer Mother   . Diabetes Father   . Heart disease Father     Social History   Social History  . Marital Status: Married    Spouse Name: N/A  . Number of Children: N/A  . Years of Education: N/A   Occupational History  . Not on file.   Social History Main Topics  . Smoking status: Never Smoker   . Smokeless tobacco: Not on file  . Alcohol Use: No  . Drug Use: No  . Sexual Activity: No   Other Topics Concern  . Not on file   Social History Narrative    Allergies  Allergen Reactions  . Penicillins Other (See Comments)     Review of Systems  Constitutional: Negative for fever, chills, weight loss and malaise/fatigue.  HENT: Negative for ear discharge, ear pain and  sore throat.   Eyes: Negative for blurred vision.  Respiratory: Negative for cough, sputum production, shortness of breath and wheezing.   Cardiovascular: Negative for chest pain, palpitations and leg swelling.  Gastrointestinal: Negative for heartburn, nausea, abdominal pain, diarrhea, constipation, blood in stool and melena.  Genitourinary: Negative for dysuria, urgency, frequency and hematuria.  Musculoskeletal: Negative for myalgias, back pain, joint pain and neck pain.  Skin: Negative for rash.  Neurological: Negative for dizziness, tingling, sensory change, focal weakness, numbness and headaches.  Endo/Heme/Allergies: Negative for environmental allergies and polydipsia. Does not bruise/bleed easily.  Psychiatric/Behavioral: Negative for depression and suicidal ideas. The patient is not nervous/anxious and does not have insomnia.      Objective  Filed Vitals:   07/09/15 0944  BP: 138/70  Pulse: 80  Height: 5\' 6"  (1.676 m)  Weight: 161 lb (73.029 kg)    Physical Exam  Constitutional: She is well-developed, well-nourished, and in no distress. No distress.  HENT:  Head: Normocephalic and atraumatic.  Right Ear: External ear normal.  Left Ear: External ear normal.  Nose: Nose normal.  Mouth/Throat: Oropharynx is clear and moist.  Eyes: Conjunctivae and EOM are normal. Pupils are equal, round, and reactive to light. Right eye exhibits no discharge. Left eye exhibits no discharge.  Neck: Normal range  of motion. Neck supple. No JVD present. No thyromegaly present.  Cardiovascular: Normal rate, regular rhythm, normal heart sounds and intact distal pulses.  Exam reveals no gallop and no friction rub.   No murmur heard. Pulmonary/Chest: Effort normal and breath sounds normal.  Abdominal: Soft. Bowel sounds are normal. She exhibits no mass. There is no tenderness. There is no guarding.  Musculoskeletal: Normal range of motion. She exhibits no edema.       Right knee: She exhibits  normal range of motion, no swelling, no effusion, no ecchymosis and no deformity. No tenderness found. No medial joint line, no lateral joint line, no MCL, no LCL and no patellar tendon tenderness noted.  Lymphadenopathy:    She has no cervical adenopathy.  Neurological: She is alert. She has normal reflexes.  Skin: Skin is warm and dry. She is not diaphoretic.  Psychiatric: Mood and affect normal.  Nursing note and vitals reviewed.     Assessment & Plan  Problem List Items Addressed This Visit    None    Visit Diagnoses    Contusion of right patella, initial encounter    -  Primary    tylenol prn         Dr. Otilio Miu [facility] [place]  07/09/2015

## 2015-08-09 ENCOUNTER — Other Ambulatory Visit: Payer: Self-pay | Admitting: Family Medicine

## 2015-09-07 ENCOUNTER — Other Ambulatory Visit: Payer: Self-pay | Admitting: Family Medicine

## 2015-10-07 ENCOUNTER — Other Ambulatory Visit: Payer: Self-pay | Admitting: Family Medicine

## 2015-10-11 ENCOUNTER — Encounter: Payer: Self-pay | Admitting: Family Medicine

## 2015-10-11 ENCOUNTER — Ambulatory Visit (INDEPENDENT_AMBULATORY_CARE_PROVIDER_SITE_OTHER): Payer: Medicare Other | Admitting: Family Medicine

## 2015-10-11 VITALS — BP 120/70 | HR 70 | Ht 66.0 in | Wt 158.0 lb

## 2015-10-11 DIAGNOSIS — E7800 Pure hypercholesterolemia, unspecified: Secondary | ICD-10-CM | POA: Diagnosis not present

## 2015-10-11 DIAGNOSIS — E119 Type 2 diabetes mellitus without complications: Secondary | ICD-10-CM | POA: Diagnosis not present

## 2015-10-11 DIAGNOSIS — I10 Essential (primary) hypertension: Secondary | ICD-10-CM | POA: Diagnosis not present

## 2015-10-11 MED ORDER — LOVASTATIN 40 MG PO TABS: 40.0000 mg | ORAL_TABLET | Freq: Every day | ORAL | 6 refills | Status: DC

## 2015-10-11 MED ORDER — GLIPIZIDE ER 5 MG PO TB24: 5.0000 mg | ORAL_TABLET | Freq: Every day | ORAL | 6 refills | Status: DC

## 2015-10-11 MED ORDER — LISINOPRIL 5 MG PO TABS: 5.0000 mg | ORAL_TABLET | Freq: Every day | ORAL | 6 refills | Status: DC

## 2015-10-11 MED ORDER — FISH OIL 1000 MG PO CAPS: 1.0000 | ORAL_CAPSULE | Freq: Every day | ORAL | 3 refills | Status: DC

## 2015-10-11 MED ORDER — ASPIRIN 81 MG PO CHEW: 81.0000 mg | CHEWABLE_TABLET | Freq: Every day | ORAL | 3 refills | Status: DC

## 2015-10-11 MED ORDER — METFORMIN HCL 1000 MG PO TABS: 1000.0000 mg | ORAL_TABLET | Freq: Two times a day (BID) | ORAL | 6 refills | Status: DC

## 2015-10-11 NOTE — Progress Notes (Signed)
Name: Cristina Morgan   MRN: [id number]    DOB: 1934/03/28   Date:10/11/2015       Progress Note  Subjective  Chief Complaint  Chief Complaint  Patient presents with  . Diabetes  . Hypertension  . Hyperlipidemia    Diabetes  She presents for her follow-up diabetic visit. She has type 2 diabetes mellitus. Her disease course has been stable. Pertinent negatives for hypoglycemia include no confusion, dizziness, headaches, hunger, mood changes, nervousness/anxiousness, pallor, seizures, sleepiness, speech difficulty, sweats or tremors. Pertinent negatives for diabetes include no blurred vision, no chest pain, no fatigue, no foot paresthesias, no foot ulcerations, no polydipsia, no polyphagia, no polyuria, no visual change, no weakness and no weight loss. There are no hypoglycemic complications. Symptoms are improving. There are no diabetic complications. Pertinent negatives for diabetic complications include no autonomic neuropathy, CVA, heart disease, impotence, nephropathy, peripheral neuropathy, PVD or retinopathy. There are no known risk factors for coronary artery disease. Current diabetic treatment includes oral agent (dual therapy). She is compliant with treatment all of the time. She is following a generally healthy diet. When asked about meal planning, she reported none. She has not had a previous visit with a dietitian. She participates in exercise daily. There is no change in her home blood glucose trend. Her breakfast blood glucose is taken between 8-9 am. Her breakfast blood glucose range is generally 130-140 mg/dl. She does not see a podiatrist.Eye exam is not current.  Hypertension  This is a chronic problem. The current episode started more than 1 year ago. The problem has been gradually improving since onset. The problem is controlled. Pertinent negatives include no anxiety, blurred vision, chest pain, headaches, malaise/fatigue, neck pain, orthopnea, palpitations, peripheral edema, PND,  shortness of breath or sweats. There are no associated agents to hypertension. There are no known risk factors for coronary artery disease. Past treatments include ACE inhibitors. The current treatment provides moderate improvement. There are no compliance problems.  There is no history of angina, kidney disease, CAD/MI, CVA, heart failure, left ventricular hypertrophy, PVD, renovascular disease or retinopathy. There is no history of chronic renal disease or a hypertension causing med.  Hyperlipidemia  This is a chronic problem. The current episode started more than 1 year ago. The problem is controlled. She has no history of chronic renal disease, hypothyroidism or obesity. There are no known factors aggravating her hyperlipidemia. Pertinent negatives include no chest pain or shortness of breath. Current antihyperlipidemic treatment includes statins (omega 3). There are no compliance problems.     No problem-specific Assessment & Plan notes found for this encounter.   Past Medical History:  Diagnosis Date  . Diabetes mellitus without complication ([place])   . Hyperlipidemia   . Hypertension     Past Surgical History:  Procedure Laterality Date  . CATARACT EXTRACTION Bilateral 2014  . COLONOSCOPY  2011   cleared for 5 yrs- [facility]  . ECTOPIC PREGNANCY SURGERY      Family History  Problem Relation Age of Onset  . Cancer Mother   . Diabetes Father   . Heart disease Father     Social History   Social History  . Marital status: Married    Spouse name: N/A  . Number of children: N/A  . Years of education: N/A   Occupational History  . Not on file.   Social History Main Topics  . Smoking status: Never Smoker  . Smokeless tobacco: Never Used  . Alcohol use No  . Drug  use: No  . Sexual activity: No   Other Topics Concern  . Not on file   Social History Narrative  . No narrative on file    Allergies  Allergen Reactions  . Penicillins Other (See Comments)     Review of  Systems  Constitutional: Negative for fatigue, malaise/fatigue and weight loss.  Eyes: Negative for blurred vision.  Respiratory: Negative for shortness of breath.   Cardiovascular: Negative for chest pain, palpitations, orthopnea and PND.  Genitourinary: Negative for impotence.  Musculoskeletal: Negative for neck pain.  Skin: Negative for pallor.  Neurological: Negative for dizziness, tremors, seizures, speech difficulty, weakness and headaches.  Endo/Heme/Allergies: Negative for polydipsia and polyphagia.  Psychiatric/Behavioral: Negative for confusion. The patient is not nervous/anxious.      Objective  Vitals:   10/11/15 1033  BP: 120/70  Pulse: 70  Weight: 158 lb (71.7 kg)  Height: 5\' 6"  (1.676 m)    Physical Exam  Constitutional: She is well-developed, well-nourished, and in no distress. No distress.  HENT:  Head: Normocephalic and atraumatic.  Right Ear: External ear normal.  Left Ear: External ear normal.  Nose: Nose normal.  Mouth/Throat: Oropharynx is clear and moist.  Eyes: Conjunctivae and EOM are normal. Pupils are equal, round, and reactive to light. Right eye exhibits no discharge. Left eye exhibits no discharge.  Neck: Normal range of motion. Neck supple. No JVD present. No thyromegaly present.  Cardiovascular: Normal rate, regular rhythm, normal heart sounds and intact distal pulses.  Exam reveals no gallop and no friction rub.   No murmur heard. Pulmonary/Chest: Effort normal and breath sounds normal.  Abdominal: Soft. Bowel sounds are normal. She exhibits no mass. There is no tenderness. There is no guarding.  Musculoskeletal: Normal range of motion. She exhibits no edema.  Lymphadenopathy:    She has no cervical adenopathy.  Neurological: She is alert. She has normal reflexes.  Skin: Skin is warm and dry. She is not diaphoretic.  Psychiatric: Mood and affect normal.  Nursing note and vitals reviewed.     Assessment & Plan  Problem List Items  Addressed This Visit      Cardiovascular and Mediastinum   BP (high blood pressure) - Primary   Relevant Medications   lisinopril (PRINIVIL,ZESTRIL) 5 MG tablet   lovastatin (MEVACOR) 40 MG tablet   aspirin 81 MG chewable tablet   Other Relevant Orders   Renal Function Panel     Endocrine   Type 2 diabetes mellitus (HCC)   Relevant Medications   glipiZIDE (GLUCOTROL XL) 5 MG 24 hr tablet   lisinopril (PRINIVIL,ZESTRIL) 5 MG tablet   lovastatin (MEVACOR) 40 MG tablet   metFORMIN (GLUCOPHAGE) 1000 MG tablet   aspirin 81 MG chewable tablet   Other Relevant Orders   Hemoglobin A1c     Other   Hypercholesterolemia   Relevant Medications   lisinopril (PRINIVIL,ZESTRIL) 5 MG tablet   lovastatin (MEVACOR) 40 MG tablet   metFORMIN (GLUCOPHAGE) 1000 MG tablet   aspirin 81 MG chewable tablet   Other Relevant Orders   Lipid Profile    Other Visit Diagnoses   None.       Dr. Macon [facility]  10/11/15

## 2015-10-12 LAB — RENAL FUNCTION PANEL
Albumin: 4.3 g/dL (ref 3.5–4.8)
BUN/Creatinine Ratio: 18 (ref 12–28)
BUN: 12 mg/dL (ref 8–27)
CO2: 28 mmol/L (ref 18–29)
CREATININE: 0.67 mg/dL (ref 0.57–1.00)
Calcium: 9.8 mg/dL (ref 8.7–10.3)
Chloride: 101 mmol/L (ref 96–106)
GFR, EST AFRICAN AMERICAN: 97 mL/min/{1.73_m2} (ref >59)
GFR, EST NON AFRICAN AMERICAN: 84 mL/min/{1.73_m2} (ref >59)
GLUCOSE: 154 mg/dL — AB (ref 65–99)
PHOSPHORUS: 3.5 mg/dL (ref 2.5–4.5)
POTASSIUM: 4.8 mmol/L (ref 3.5–5.2)
SODIUM: 140 mmol/L (ref 134–144)

## 2015-10-12 LAB — LIPID PANEL
CHOL/HDL RATIO: 2.4 ratio (ref 0.0–4.4)
Cholesterol, Total: 173 mg/dL (ref 100–199)
HDL: 73 mg/dL (ref >39)
LDL CALC: 87 mg/dL (ref 0–99)
TRIGLYCERIDES: 63 mg/dL (ref 0–149)
VLDL Cholesterol Cal: 13 mg/dL (ref 5–40)

## 2015-10-12 LAB — HEMOGLOBIN A1C
Est. average glucose Bld gHb Est-mCnc: 163 mg/dL
HEMOGLOBIN A1C: 7.3 % — AB (ref 4.8–5.6)

## 2015-12-27 ENCOUNTER — Encounter: Payer: Self-pay | Admitting: Family Medicine

## 2015-12-27 ENCOUNTER — Ambulatory Visit (INDEPENDENT_AMBULATORY_CARE_PROVIDER_SITE_OTHER): Payer: Medicare Other | Admitting: Family Medicine

## 2015-12-27 VITALS — BP 130/72 | HR 71 | Ht 66.0 in | Wt 156.0 lb

## 2015-12-27 DIAGNOSIS — E7800 Pure hypercholesterolemia, unspecified: Secondary | ICD-10-CM | POA: Diagnosis not present

## 2015-12-27 DIAGNOSIS — Z23 Encounter for immunization: Secondary | ICD-10-CM

## 2015-12-27 DIAGNOSIS — E119 Type 2 diabetes mellitus without complications: Secondary | ICD-10-CM

## 2015-12-27 DIAGNOSIS — I10 Essential (primary) hypertension: Secondary | ICD-10-CM

## 2015-12-27 MED ORDER — FISH OIL 1000 MG PO CAPS: 1.0000 | ORAL_CAPSULE | Freq: Every day | ORAL | 3 refills | Status: DC

## 2015-12-27 MED ORDER — LISINOPRIL 5 MG PO TABS: 5.0000 mg | ORAL_TABLET | Freq: Every day | ORAL | 6 refills | Status: DC

## 2015-12-27 MED ORDER — LOVASTATIN 40 MG PO TABS: 40.0000 mg | ORAL_TABLET | Freq: Every day | ORAL | 6 refills | Status: DC

## 2015-12-27 MED ORDER — GLIPIZIDE ER 5 MG PO TB24
5.0000 mg | ORAL_TABLET | Freq: Every day | ORAL | 6 refills | Status: DC
Start: 2015-12-27 — End: 2016-11-06

## 2015-12-27 MED ORDER — METFORMIN HCL 1000 MG PO TABS: 1000.0000 mg | ORAL_TABLET | Freq: Two times a day (BID) | ORAL | 6 refills | Status: DC

## 2015-12-27 NOTE — Progress Notes (Signed)
Name: Cristina Morgan   MRN: [id number]    DOB: [birth year]   Date:12/27/2015       Progress Note  Subjective  Chief Complaint  Chief Complaint  Patient presents with  . Annual Exam    no pap- questionable mammo  . Hyperlipidemia  . Diabetes  . Hypertension    Hyperlipidemia  This is a chronic problem. The current episode started more than 1 year ago. The problem is controlled. Recent lipid tests were reviewed and are normal. Exacerbating diseases include diabetes. She has no history of chronic renal disease, hypothyroidism, liver disease, obesity or nephrotic syndrome. There are no known factors aggravating her hyperlipidemia. Pertinent negatives include no chest pain, focal sensory loss, focal weakness, leg pain, myalgias or shortness of breath. Current antihyperlipidemic treatment includes statins (omega 3). The current treatment provides mild improvement of lipids. There are no compliance problems.  Risk factors for coronary artery disease include dyslipidemia, diabetes mellitus and hypertension.  Diabetes  She presents for her follow-up diabetic visit. She has type 2 diabetes mellitus. Her disease course has been stable. Pertinent negatives for hypoglycemia include no confusion, dizziness, headaches, hunger, mood changes, nervousness/anxiousness, pallor, seizures, sleepiness, speech difficulty, sweats or tremors. Pertinent negatives for diabetes include no blurred vision, no chest pain, no fatigue, no foot paresthesias, no foot ulcerations, no polydipsia, no polyphagia, no polyuria, no visual change, no weakness and no weight loss. There are no hypoglycemic complications. Symptoms are stable. There are no diabetic complications. Pertinent negatives for diabetic complications include no autonomic neuropathy, CVA, heart disease, nephropathy, peripheral neuropathy, PVD or retinopathy. Current diabetic treatment includes oral agent (dual therapy). She is compliant with treatment all of the time.  Her weight is stable. She is following a generally healthy diet. She participates in exercise weekly. Her breakfast blood glucose is taken between 8-9 am. Her breakfast blood glucose range is generally 110-130 mg/dl. An ACE inhibitor/angiotensin II receptor blocker is being taken. She does not see a podiatrist.Eye exam is not current.  Hypertension  This is a recurrent problem. The current episode started in the past 7 days. The problem has been gradually improving since onset. The problem is controlled. Pertinent negatives include no anxiety, blurred vision, chest pain, headaches, malaise/fatigue, neck pain, orthopnea, palpitations, peripheral edema, PND, shortness of breath or sweats. There are no associated agents to hypertension. There are no known risk factors for coronary artery disease. Past treatments include ACE inhibitors. The current treatment provides mild improvement. There are no compliance problems.  There is no history of angina, kidney disease, CAD/MI, CVA, heart failure, left ventricular hypertrophy, PVD, renovascular disease or retinopathy. There is no history of chronic renal disease or a hypertension causing med.    No problem-specific Assessment & Plan notes found for this encounter.   Past Medical History:  Diagnosis Date  . Diabetes mellitus without complication ([place])   . Hyperlipidemia   . Hypertension     Past Surgical History:  Procedure Laterality Date  . CATARACT EXTRACTION Bilateral 2014  . COLONOSCOPY  2011   cleared for 5 yrs- [facility]  . ECTOPIC PREGNANCY SURGERY      Family History  Problem Relation Age of Onset  . Cancer Mother   . Diabetes Father   . Heart disease Father     Social History   Social History  . Marital status: Married    Spouse name: N/A  . Number of children: N/A  . Years of education: N/A   Occupational History  .  Not on file.   Social History Main Topics  . Smoking status: Never Smoker  . Smokeless tobacco: Never Used  .  Alcohol use No  . Drug use: No  . Sexual activity: No   Other Topics Concern  . Not on file   Social History Narrative  . No narrative on file    Allergies  Allergen Reactions  . Penicillins Other (See Comments)     Review of Systems  Constitutional: Negative for chills, fatigue, fever, malaise/fatigue and weight loss.  HENT: Negative for ear discharge, ear pain and sore throat.   Eyes: Negative for blurred vision.  Respiratory: Negative for cough, sputum production, shortness of breath and wheezing.   Cardiovascular: Negative for chest pain, palpitations, orthopnea, leg swelling and PND.  Gastrointestinal: Negative for abdominal pain, blood in stool, constipation, diarrhea, heartburn, melena and nausea.  Genitourinary: Negative for dysuria, frequency, hematuria and urgency.  Musculoskeletal: Negative for back pain, joint pain, myalgias and neck pain.  Skin: Negative for pallor and rash.  Neurological: Negative for dizziness, tingling, tremors, sensory change, focal weakness, seizures, speech difficulty, weakness and headaches.  Endo/Heme/Allergies: Negative for environmental allergies, polydipsia and polyphagia. Does not bruise/bleed easily.  Psychiatric/Behavioral: Negative for confusion, depression and suicidal ideas. The patient is not nervous/anxious and does not have insomnia.      Objective  Vitals:   12/27/15 0934  BP: 130/72  Pulse: 71  Weight: 156 lb (70.8 kg)  Height: 5\' 6"  (1.676 m)    Physical Exam  Constitutional: She is well-developed, well-nourished, and in no distress. No distress.  HENT:  Head: Normocephalic and atraumatic.  Right Ear: External ear normal.  Left Ear: External ear normal.  Nose: Nose normal.  Mouth/Throat: Oropharynx is clear and moist.  Eyes: Conjunctivae and EOM are normal. Pupils are equal, round, and reactive to light. Right eye exhibits no discharge. Left eye exhibits no discharge.  Neck: Normal range of motion. Neck supple.  No JVD present. No thyromegaly present.  Cardiovascular: Normal rate, regular rhythm, normal heart sounds and intact distal pulses.  Exam reveals no gallop and no friction rub.   No murmur heard. Pulmonary/Chest: Effort normal and breath sounds normal.  Abdominal: Soft. Bowel sounds are normal. She exhibits no mass. There is no tenderness. There is no guarding.  Musculoskeletal: Normal range of motion. She exhibits no edema.  Lymphadenopathy:    She has no cervical adenopathy.  Neurological: She is alert. She has normal reflexes.  Skin: Skin is warm and dry. She is not diaphoretic.  Psychiatric: Mood and affect normal.  Nursing note and vitals reviewed.     Assessment & Plan  Problem List Items Addressed This Visit      Cardiovascular and Mediastinum   Essential hypertension - Primary   Relevant Medications   lisinopril (PRINIVIL,ZESTRIL) 5 MG tablet   lovastatin (MEVACOR) 40 MG tablet   Other Relevant Orders   Renal Function Panel     Endocrine   Type 2 diabetes mellitus (HCC)   Relevant Medications   lisinopril (PRINIVIL,ZESTRIL) 5 MG tablet   lovastatin (MEVACOR) 40 MG tablet   metFORMIN (GLUCOPHAGE) 1000 MG tablet   glipiZIDE (GLUCOTROL XL) 5 MG 24 hr tablet   Other Relevant Orders   Hemoglobin A1c   Renal Function Panel     Other   Hypercholesterolemia   Relevant Medications   lisinopril (PRINIVIL,ZESTRIL) 5 MG tablet   lovastatin (MEVACOR) 40 MG tablet   metFORMIN (GLUCOPHAGE) 1000 MG tablet   Omega-3 Fatty  Acids (FISH OIL) 1000 MG CAPS   Other Relevant Orders   Lipid Profile    Other Visit Diagnoses    Immunization due       Relevant Orders   Flu Vaccine QUAD 36+ mos PF IM (Fluarix & Fluzone Quad PF) (Completed)        Dr. [facility]  12/27/15

## 2015-12-28 LAB — RENAL FUNCTION PANEL
Albumin: 3.8 g/dL (ref 3.5–4.8)
BUN / CREAT RATIO: 21 (ref 12–28)
BUN: 13 mg/dL (ref 8–27)
CHLORIDE: 101 mmol/L (ref 96–106)
CO2: 27 mmol/L (ref 18–29)
Calcium: 9.5 mg/dL (ref 8.7–10.3)
Creatinine, Ser: 0.63 mg/dL (ref 0.57–1.00)
GFR, EST AFRICAN AMERICAN: 99 mL/min/{1.73_m2} (ref >59)
GFR, EST NON AFRICAN AMERICAN: 86 mL/min/{1.73_m2} (ref >59)
GLUCOSE: 140 mg/dL — AB (ref 65–99)
POTASSIUM: 4.2 mmol/L (ref 3.5–5.2)
Phosphorus: 3.2 mg/dL (ref 2.5–4.5)
SODIUM: 141 mmol/L (ref 134–144)

## 2015-12-28 LAB — HEMOGLOBIN A1C
ESTIMATED AVERAGE GLUCOSE: 157 mg/dL
Hgb A1c MFr Bld: 7.1 % — ABNORMAL HIGH (ref 4.8–5.6)

## 2015-12-28 LAB — LIPID PANEL
CHOL/HDL RATIO: 2.7 ratio (ref 0.0–4.4)
Cholesterol, Total: 133 mg/dL (ref 100–199)
HDL: 49 mg/dL (ref >39)
LDL CALC: 74 mg/dL (ref 0–99)
Triglycerides: 52 mg/dL (ref 0–149)
VLDL CHOLESTEROL CAL: 10 mg/dL (ref 5–40)

## 2016-05-05 ENCOUNTER — Encounter: Payer: Self-pay | Admitting: Family Medicine

## 2016-05-05 ENCOUNTER — Ambulatory Visit (INDEPENDENT_AMBULATORY_CARE_PROVIDER_SITE_OTHER): Payer: Medicare Other | Admitting: Family Medicine

## 2016-05-05 VITALS — BP 110/80 | HR 80 | Ht 66.0 in | Wt 152.0 lb

## 2016-05-05 DIAGNOSIS — J0100 Acute maxillary sinusitis, unspecified: Secondary | ICD-10-CM | POA: Diagnosis not present

## 2016-05-05 DIAGNOSIS — N3090 Cystitis, unspecified without hematuria: Secondary | ICD-10-CM

## 2016-05-05 LAB — POCT URINALYSIS DIPSTICK
Bilirubin, UA: NEGATIVE
Glucose, UA: NEGATIVE
KETONES UA: NEGATIVE
Nitrite, UA: POSITIVE
PH UA: 5
SPEC GRAV UA: 1.02
UROBILINOGEN UA: 0.2

## 2016-05-05 MED ORDER — SULFAMETHOXAZOLE-TRIMETHOPRIM 800-160 MG PO TABS: 1.0000 | ORAL_TABLET | Freq: Two times a day (BID) | ORAL | 0 refills | Status: DC

## 2016-05-05 NOTE — Progress Notes (Signed)
Name: Cristina Morgan   MRN: [id number]    DOB: August 30, 1934   Date:05/05/2016       Progress Note  Subjective  Chief Complaint  Chief Complaint  Patient presents with  . Urinary Tract Infection    was recently "sick"- every time "I get sick, I have a bladder problem"    Urinary Tract Infection   This is a new problem. The current episode started in the past 7 days. The problem occurs every urination. The problem has been waxing and waning. The quality of the pain is described as burning. The pain is at a severity of 2/10. The pain is mild. There has been no fever. There is no history of pyelonephritis. Associated symptoms include frequency, hematuria and urgency. Pertinent negatives include no chills, discharge, flank pain, hesitancy, nausea, sweats or vomiting. She has tried nothing for the symptoms. There is no history of catheterization, kidney stones, recurrent UTIs, a single kidney, urinary stasis or a urological procedure.  Sinusitis  This is a chronic problem. The current episode started in the past 7 days. The problem has been waxing and waning since onset. There has been no fever. She is experiencing no pain. Associated symptoms include congestion, coughing and sinus pressure. Pertinent negatives include no chills, diaphoresis, ear pain, headaches, neck pain, shortness of breath or sore throat. Past treatments include oral decongestants. The treatment provided mild relief.    No problem-specific Assessment & Plan notes found for this encounter.   Past Medical History:  Diagnosis Date  . Diabetes mellitus without complication ([place])   . Hyperlipidemia   . Hypertension     Past Surgical History:  Procedure Laterality Date  . CATARACT EXTRACTION Bilateral 2014  . COLONOSCOPY  2011   cleared for 5 yrs- [facility]  . ECTOPIC PREGNANCY SURGERY      Family History  Problem Relation Age of Onset  . Cancer Mother   . Diabetes Father   . Heart disease Father     Social History    Social History  . Marital status: Married    Spouse name: N/A  . Number of children: N/A  . Years of education: N/A   Occupational History  . Not on file.   Social History Main Topics  . Smoking status: Never Smoker  . Smokeless tobacco: Never Used  . Alcohol use No  . Drug use: No  . Sexual activity: No   Other Topics Concern  . Not on file   Social History Narrative  . No narrative on file    Allergies  Allergen Reactions  . Penicillins Other (See Comments)     Review of Systems  Constitutional: Negative for chills, diaphoresis, fever, malaise/fatigue and weight loss.  HENT: Positive for congestion and sinus pressure. Negative for ear discharge, ear pain and sore throat.   Eyes: Negative for blurred vision.  Respiratory: Positive for cough. Negative for sputum production, shortness of breath and wheezing.   Cardiovascular: Negative for chest pain, palpitations and leg swelling.  Gastrointestinal: Negative for abdominal pain, blood in stool, constipation, diarrhea, heartburn, melena, nausea and vomiting.  Genitourinary: Positive for frequency, hematuria and urgency. Negative for dysuria, flank pain and hesitancy.  Musculoskeletal: Negative for back pain, joint pain, myalgias and neck pain.  Skin: Negative for rash.  Neurological: Negative for dizziness, tingling, sensory change, focal weakness and headaches.  Endo/Heme/Allergies: Negative for environmental allergies and polydipsia. Does not bruise/bleed easily.  Psychiatric/Behavioral: Negative for depression and suicidal ideas. The patient is not nervous/anxious  and does not have insomnia.      Objective  Vitals:   05/05/16 0917  BP: 110/80  Pulse: 80  Weight: 152 lb (68.9 kg)  Height: 5\' 6"  (1.676 m)    Physical Exam  Constitutional: She is well-developed, well-nourished, and in no distress. No distress.  HENT:  Head: Normocephalic and atraumatic.  Right Ear: External ear normal.  Left Ear: External  ear normal.  Nose: Nose normal.  Mouth/Throat: Oropharynx is clear and moist.  Eyes: Conjunctivae and EOM are normal. Pupils are equal, round, and reactive to light. Right eye exhibits no discharge. Left eye exhibits no discharge.  Neck: Normal range of motion. Neck supple. No JVD present. No thyromegaly present.  Cardiovascular: Normal rate, regular rhythm, normal heart sounds and intact distal pulses.  Exam reveals no gallop and no friction rub.   No murmur heard. Pulmonary/Chest: Effort normal and breath sounds normal. No respiratory distress. She has no wheezes. She has no rales. She exhibits no tenderness.  Abdominal: Soft. Bowel sounds are normal. She exhibits no mass. There is no tenderness. There is no guarding.  Musculoskeletal: Normal range of motion. She exhibits no edema.  Lymphadenopathy:    She has no cervical adenopathy.  Neurological: She is alert. She has normal reflexes.  Skin: Skin is warm and dry. She is not diaphoretic.  Psychiatric: Mood and affect normal.  Nursing note and vitals reviewed.     Assessment & Plan  Problem List Items Addressed This Visit    None    Visit Diagnoses    Cystitis    -  Primary   Relevant Medications   sulfamethoxazole-trimethoprim (BACTRIM DS,SEPTRA DS) 800-160 MG tablet   Other Relevant Orders   POCT urinalysis dipstick (Completed)   Acute maxillary sinusitis, recurrence not specified       Relevant Medications   sulfamethoxazole-trimethoprim (BACTRIM DS,SEPTRA DS) 800-160 MG tablet        Dr. Deanna Jones [facility]  05/05/16

## 2016-05-19 DIAGNOSIS — E113393 Type 2 diabetes mellitus with moderate nonproliferative diabetic retinopathy without macular edema, bilateral: Secondary | ICD-10-CM | POA: Diagnosis not present

## 2016-06-04 ENCOUNTER — Other Ambulatory Visit: Payer: Self-pay | Admitting: Family Medicine

## 2016-06-04 DIAGNOSIS — E119 Type 2 diabetes mellitus without complications: Secondary | ICD-10-CM

## 2016-06-04 DIAGNOSIS — I10 Essential (primary) hypertension: Secondary | ICD-10-CM

## 2016-06-04 DIAGNOSIS — E7800 Pure hypercholesterolemia, unspecified: Secondary | ICD-10-CM

## 2016-06-26 ENCOUNTER — Ambulatory Visit: Payer: Medicare Other | Admitting: Family Medicine

## 2016-07-07 ENCOUNTER — Other Ambulatory Visit: Payer: Self-pay | Admitting: Family Medicine

## 2016-07-07 DIAGNOSIS — E119 Type 2 diabetes mellitus without complications: Secondary | ICD-10-CM

## 2016-07-07 DIAGNOSIS — E7800 Pure hypercholesterolemia, unspecified: Secondary | ICD-10-CM

## 2016-07-07 DIAGNOSIS — I10 Essential (primary) hypertension: Secondary | ICD-10-CM

## 2016-08-06 ENCOUNTER — Other Ambulatory Visit: Payer: Self-pay | Admitting: Family Medicine

## 2016-08-06 DIAGNOSIS — I10 Essential (primary) hypertension: Secondary | ICD-10-CM

## 2016-08-06 DIAGNOSIS — E119 Type 2 diabetes mellitus without complications: Secondary | ICD-10-CM

## 2016-08-06 DIAGNOSIS — E7800 Pure hypercholesterolemia, unspecified: Secondary | ICD-10-CM

## 2016-08-18 ENCOUNTER — Encounter: Payer: Self-pay | Admitting: Family Medicine

## 2016-08-18 ENCOUNTER — Ambulatory Visit (INDEPENDENT_AMBULATORY_CARE_PROVIDER_SITE_OTHER): Payer: Medicare Other | Admitting: Family Medicine

## 2016-08-18 ENCOUNTER — Ambulatory Visit
Admission: RE | Admit: 2016-08-18 | Discharge: 2016-08-18 | Disposition: A | Discharge status: Home / Self Care | Payer: Medicare Other | Source: Ambulatory Visit | Attending: Family Medicine | Admitting: Family Medicine

## 2016-08-18 VITALS — BP 120/70 | HR 64 | Ht 66.0 in | Wt 155.0 lb

## 2016-08-18 DIAGNOSIS — S9781XA Crushing injury of right foot, initial encounter: Secondary | ICD-10-CM | POA: Insufficient documentation

## 2016-08-18 DIAGNOSIS — X58XXXA Exposure to other specified factors, initial encounter: Secondary | ICD-10-CM | POA: Insufficient documentation

## 2016-08-18 DIAGNOSIS — M7989 Other specified soft tissue disorders: Secondary | ICD-10-CM | POA: Diagnosis not present
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
## 2016-08-18 MED ORDER — TRAMADOL HCL 50 MG PO TABS: 50.0000 mg | ORAL_TABLET | Freq: Three times a day (TID) | ORAL | 0 refills | Status: DC | PRN

## 2016-08-18 NOTE — Progress Notes (Signed)
Name: Cristina Morgan   MRN: [id number]    DOB: 1935-01-06   Date:08/18/2016       Progress Note  Subjective  Chief Complaint  Chief Complaint  Patient presents with  . Foot Pain    R) foot- dropped a tray of wood of foot x 1 week ago- swelling, pain on top of foot    Foot Pain  This is a new problem. The current episode started 1 to 4 weeks ago (10 days ago). The problem occurs constantly. The problem has been waxing and waning. Associated symptoms include arthralgias, joint swelling and myalgias. Pertinent negatives include no abdominal pain, anorexia, change in bowel habit, chest pain, chills, congestion, coughing, diaphoresis, fatigue, fever, headaches, nausea, neck pain, numbness, rash, sore throat, swollen glands, urinary symptoms, vertigo, visual change, vomiting or weakness. The symptoms are aggravated by standing and walking. She has tried acetaminophen for the symptoms. The treatment provided moderate relief.    No problem-specific Assessment & Plan notes found for this encounter.   Past Medical History:  Diagnosis Date  . Diabetes mellitus without complication ([place])   . Hyperlipidemia   . Hypertension     Past Surgical History:  Procedure Laterality Date  . CATARACT EXTRACTION Bilateral 2014  . COLONOSCOPY  2011   cleared for 5 yrs- [facility]  . ECTOPIC PREGNANCY SURGERY      Family History  Problem Relation Age of Onset  . Cancer Mother   . Diabetes Father   . Heart disease Father     Social History   Social History  . Marital status: Married    Spouse name: N/A  . Number of children: N/A  . Years of education: N/A   Occupational History  . Not on file.   Social History Main Topics  . Smoking status: Never Smoker  . Smokeless tobacco: Never Used  . Alcohol use No  . Drug use: No  . Sexual activity: No   Other Topics Concern  . Not on file   Social History Narrative  . No narrative on file    Allergies  Allergen Reactions  . Penicillins Other  (See Comments)    Outpatient Medications Prior to Visit  Medication Sig Dispense Refill  . aspirin 81 MG chewable tablet Chew 1 tablet (81 mg total) by mouth daily. 100 tablet 3  . glipiZIDE (GLUCOTROL XL) 5 MG 24 hr tablet Take 1 tablet (5 mg total) by mouth daily. 30 tablet 6  . lisinopril (PRINIVIL,ZESTRIL) 5 MG tablet Take 1 tablet (5 mg total) by mouth daily. 30 tablet 6  . lovastatin (MEVACOR) 40 MG tablet Take 1 tablet (40 mg total) by mouth daily. 30 tablet 6  . metFORMIN (GLUCOPHAGE) 1000 MG tablet Take 1 tablet (1,000 mg total) by mouth 2 (two) times daily. 60 tablet 6  . Omega-3 Fatty Acids (FISH OIL) 1000 MG CAPS Take 1 capsule (1,000 mg total) by mouth daily. 100 capsule 3  . GLIPIZIDE XL 5 MG 24 hr tablet TAKE 1 TABLET BY MOUTH ONCE DAILY 30 tablet 0  . lisinopril (PRINIVIL,ZESTRIL) 5 MG tablet TAKE 1 TABLET BY MOUTH ONCE DAILY 30 tablet 0  . lovastatin (MEVACOR) 40 MG tablet TAKE 1 TABLET BY MOUTH ONCE DAILY NEEDS APPOINTMENT 30 tablet 0  . sulfamethoxazole-trimethoprim (BACTRIM DS,SEPTRA DS) 800-160 MG tablet Take 1 tablet by mouth 2 (two) times daily. 14 tablet 0   No facility-administered medications prior to visit.     Review of Systems  Constitutional: Negative for  chills, diaphoresis, fatigue, fever, malaise/fatigue and weight loss.  HENT: Negative for congestion, ear discharge, ear pain and sore throat.   Eyes: Negative for blurred vision.  Respiratory: Negative for cough, sputum production, shortness of breath and wheezing.   Cardiovascular: Negative for chest pain, palpitations and leg swelling.  Gastrointestinal: Negative for abdominal pain, anorexia, blood in stool, change in bowel habit, constipation, diarrhea, heartburn, melena, nausea and vomiting.  Genitourinary: Negative for dysuria, frequency, hematuria and urgency.  Musculoskeletal: Positive for arthralgias, joint pain, joint swelling and myalgias. Negative for back pain and neck pain.  Skin: Negative for  rash.  Neurological: Negative for dizziness, vertigo, tingling, sensory change, focal weakness, weakness, numbness and headaches.  Endo/Heme/Allergies: Negative for environmental allergies and polydipsia. Does not bruise/bleed easily.  Psychiatric/Behavioral: Negative for depression and suicidal ideas. The patient is not nervous/anxious and does not have insomnia.      Objective  Vitals:   08/18/16 1041  BP: 120/70  Pulse: 64  Weight: 155 lb (70.3 kg)  Height: 5\' 6"  (1.676 m)    Physical Exam  Constitutional: She is well-developed, well-nourished, and in no distress. No distress.  HENT:  Head: Normocephalic and atraumatic.  Right Ear: External ear normal.  Left Ear: External ear normal.  Nose: Nose normal.  Mouth/Throat: Oropharynx is clear and moist.  Eyes: Conjunctivae and EOM are normal. Pupils are equal, round, and reactive to light. Right eye exhibits no discharge. Left eye exhibits no discharge.  Neck: Normal range of motion. Neck supple. No JVD present. No thyromegaly present.  Cardiovascular: Normal rate, regular rhythm, normal heart sounds and intact distal pulses.  Exam reveals no gallop and no friction rub.   No murmur heard. Pulmonary/Chest: Effort normal and breath sounds normal. She has no wheezes. She has no rales.  Abdominal: Soft. Bowel sounds are normal. She exhibits no mass. There is no tenderness. There is no guarding.  Musculoskeletal: Normal range of motion. She exhibits no edema.       Right foot: There is tenderness, bony tenderness and swelling.  Ecchymosis right foot/dorsem/tender along 4th right metatarsal  Lymphadenopathy:    She has no cervical adenopathy.  Neurological: She is alert.  Skin: Skin is warm and dry. She is not diaphoretic.  Psychiatric: Mood and affect normal.  Nursing note and vitals reviewed.     Assessment & Plan  Problem List Items Addressed This Visit    None    Visit Diagnoses    Crushing injury of right foot, initial  encounter    -  Primary   Relevant Medications   traMADol (ULTRAM) 50 MG tablet   Other Relevant Orders   DG Foot Complete Right (Completed)      Meds ordered this encounter  Medications  . traMADol (ULTRAM) 50 MG tablet    Sig: Take 1 tablet (50 mg total) by mouth every 8 (eight) hours as needed.    Dispense:  30 tablet    Refill:  0      Dr. Florabel Faulks [facility]  08/18/16

## 2016-09-08 ENCOUNTER — Other Ambulatory Visit: Payer: Self-pay | Admitting: Family Medicine

## 2016-09-08 DIAGNOSIS — E119 Type 2 diabetes mellitus without complications: Secondary | ICD-10-CM

## 2016-09-08 DIAGNOSIS — E7800 Pure hypercholesterolemia, unspecified: Secondary | ICD-10-CM

## 2016-09-08 DIAGNOSIS — I10 Essential (primary) hypertension: Secondary | ICD-10-CM

## 2016-09-29 ENCOUNTER — Other Ambulatory Visit: Payer: Self-pay

## 2016-10-09 ENCOUNTER — Other Ambulatory Visit: Payer: Self-pay | Admitting: Family Medicine

## 2016-10-09 DIAGNOSIS — I10 Essential (primary) hypertension: Secondary | ICD-10-CM

## 2016-10-09 DIAGNOSIS — E119 Type 2 diabetes mellitus without complications: Secondary | ICD-10-CM

## 2016-10-09 DIAGNOSIS — E7800 Pure hypercholesterolemia, unspecified: Secondary | ICD-10-CM

## 2016-10-13 ENCOUNTER — Other Ambulatory Visit: Payer: Self-pay

## 2016-11-05 DIAGNOSIS — S8391XA Sprain of unspecified site of right knee, initial encounter: Secondary | ICD-10-CM | POA: Diagnosis not present

## 2016-11-05 DIAGNOSIS — H6123 Impacted cerumen, bilateral: Secondary | ICD-10-CM | POA: Diagnosis not present

## 2016-11-05 DIAGNOSIS — E113393 Type 2 diabetes mellitus with moderate nonproliferative diabetic retinopathy without macular edema, bilateral: Secondary | ICD-10-CM | POA: Diagnosis not present

## 2016-11-06 ENCOUNTER — Other Ambulatory Visit: Payer: Self-pay | Admitting: Family Medicine

## 2016-11-06 DIAGNOSIS — I10 Essential (primary) hypertension: Secondary | ICD-10-CM

## 2016-11-06 DIAGNOSIS — E119 Type 2 diabetes mellitus without complications: Secondary | ICD-10-CM

## 2016-11-06 DIAGNOSIS — E7800 Pure hypercholesterolemia, unspecified: Secondary | ICD-10-CM

## 2016-11-11 DIAGNOSIS — M1711 Unilateral primary osteoarthritis, right knee: Secondary | ICD-10-CM | POA: Diagnosis not present

## 2016-11-23 ENCOUNTER — Other Ambulatory Visit: Payer: Self-pay | Admitting: Family Medicine

## 2016-11-23 DIAGNOSIS — E119 Type 2 diabetes mellitus without complications: Secondary | ICD-10-CM

## 2016-11-23 DIAGNOSIS — I10 Essential (primary) hypertension: Secondary | ICD-10-CM

## 2016-12-08 DIAGNOSIS — M1711 Unilateral primary osteoarthritis, right knee: Secondary | ICD-10-CM | POA: Diagnosis not present

## 2016-12-23 ENCOUNTER — Other Ambulatory Visit: Payer: Self-pay | Admitting: Family Medicine

## 2016-12-23 DIAGNOSIS — E7800 Pure hypercholesterolemia, unspecified: Secondary | ICD-10-CM

## 2016-12-30 ENCOUNTER — Other Ambulatory Visit: Payer: Self-pay | Admitting: Family Medicine

## 2016-12-30 DIAGNOSIS — E7800 Pure hypercholesterolemia, unspecified: Secondary | ICD-10-CM

## 2017-01-07 ENCOUNTER — Ambulatory Visit (INDEPENDENT_AMBULATORY_CARE_PROVIDER_SITE_OTHER): Payer: Medicare Other | Admitting: Family Medicine

## 2017-01-07 ENCOUNTER — Encounter: Payer: Self-pay | Admitting: Family Medicine

## 2017-01-07 VITALS — BP 130/80 | HR 80 | Ht 66.0 in | Wt 157.0 lb

## 2017-01-07 DIAGNOSIS — I10 Essential (primary) hypertension: Secondary | ICD-10-CM

## 2017-01-07 DIAGNOSIS — E119 Type 2 diabetes mellitus without complications: Secondary | ICD-10-CM | POA: Diagnosis not present

## 2017-01-07 DIAGNOSIS — E7800 Pure hypercholesterolemia, unspecified: Secondary | ICD-10-CM | POA: Diagnosis not present

## 2017-01-07 DIAGNOSIS — Z23 Encounter for immunization: Secondary | ICD-10-CM | POA: Insufficient documentation

## 2017-01-07 MED ORDER — LOVASTATIN 40 MG PO TABS: 40.0000 mg | ORAL_TABLET | Freq: Every day | ORAL | 1 refills | Status: DC

## 2017-01-07 MED ORDER — LISINOPRIL 5 MG PO TABS: 5.0000 mg | ORAL_TABLET | Freq: Every day | ORAL | 1 refills | Status: DC

## 2017-01-07 MED ORDER — METFORMIN HCL 1000 MG PO TABS: 1000.0000 mg | ORAL_TABLET | Freq: Two times a day (BID) | ORAL | 1 refills | Status: DC

## 2017-01-07 MED ORDER — GLIPIZIDE ER 5 MG PO TB24: 5.0000 mg | ORAL_TABLET | Freq: Every day | ORAL | 1 refills | Status: DC

## 2017-01-07 NOTE — Progress Notes (Signed)
Name: Cristina Morgan   MRN: [id number]    DOB: 07-Jun-1934   Date:01/07/2017       Progress Note  Subjective  Chief Complaint  Chief Complaint  Patient presents with  . Hyperlipidemia  . Hypertension  . Diabetes    Hyperlipidemia  This is a chronic problem. The problem is controlled. Recent lipid tests were reviewed and are normal. Exacerbating diseases include diabetes. She has no history of chronic renal disease. There are no known factors aggravating her hyperlipidemia. Pertinent negatives include no chest pain, focal sensory loss, focal weakness, leg pain, myalgias or shortness of breath. Current antihyperlipidemic treatment includes statins. The current treatment provides moderate improvement of lipids. There are no compliance problems.  Risk factors for coronary artery disease include dyslipidemia.  Hypertension  This is a chronic problem. The current episode started more than 1 year ago. The problem is controlled. Pertinent negatives include no anxiety, blurred vision, chest pain, headaches, malaise/fatigue, neck pain, orthopnea, palpitations, peripheral edema, PND, shortness of breath or sweats. There are no associated agents to hypertension. Risk factors for coronary artery disease include diabetes mellitus and dyslipidemia. Past treatments include ACE inhibitors. The current treatment provides moderate improvement. There are no compliance problems.  There is no history of angina, kidney disease, CAD/MI, CVA, heart failure, left ventricular hypertrophy, PVD or retinopathy. There is no history of chronic renal disease, a hypertension causing med or renovascular disease.  Diabetes  She presents for her follow-up diabetic visit. She has type 2 diabetes mellitus. Her disease course has been stable. Pertinent negatives for hypoglycemia include no confusion, dizziness, headaches, nervousness/anxiousness, pallor or sweats. Pertinent negatives for diabetes include no blurred vision, no chest pain,  no fatigue, no foot paresthesias, no foot ulcerations, no polydipsia, no polyphagia, no polyuria, no visual change, no weakness and no weight loss. There are no hypoglycemic complications. Symptoms are stable. Pertinent negatives for diabetic complications include no CVA, nephropathy, peripheral neuropathy, PVD or retinopathy. Risk factors for coronary artery disease include diabetes mellitus. She is following a generally healthy diet. She participates in exercise intermittently. There is no change in her home blood glucose trend. Her breakfast blood glucose is taken between 8-9 am. Her breakfast blood glucose range is generally 90-110 mg/dl. An ACE inhibitor/angiotensin II receptor blocker is being taken. She does not see a podiatrist.Eye exam is current.    No problem-specific Assessment & Plan notes found for this encounter.   Past Medical History:  Diagnosis Date  . Diabetes mellitus without complication ([place])   . Hyperlipidemia   . Hypertension     Past Surgical History:  Procedure Laterality Date  . CATARACT EXTRACTION Bilateral 2014  . COLONOSCOPY  2011   cleared for 5 yrs- [facility]  . ECTOPIC PREGNANCY SURGERY      Family History  Problem Relation Age of Onset  . Cancer Mother   . Diabetes Father   . Heart disease Father     Social History   Social History  . Marital status: Married    Spouse name: N/A  . Number of children: N/A  . Years of education: N/A   Occupational History  . Not on file.   Social History Main Topics  . Smoking status: Never Smoker  . Smokeless tobacco: Never Used  . Alcohol use No  . Drug use: No  . Sexual activity: No   Other Topics Concern  . Not on file   Social History Narrative  . No narrative on file  Allergies  Allergen Reactions  . Penicillins Other (See Comments)    Outpatient Medications Prior to Visit  Medication Sig Dispense Refill  . aspirin 81 MG chewable tablet Chew 1 tablet (81 mg total) by mouth daily. 100  tablet 3  . Omega-3 Fatty Acids (FISH OIL) 1000 MG CAPS Take 1 capsule (1,000 mg total) by mouth daily. 100 capsule 3  . GLIPIZIDE XL 5 MG 24 hr tablet TAKE 1 TABLET BY MOUTH ONCE DAILY 90 tablet 0  . lisinopril (PRINIVIL,ZESTRIL) 5 MG tablet Take 1 tablet (5 mg total) by mouth daily. 30 tablet 6  . lovastatin (MEVACOR) 40 MG tablet Take 1 tablet (40 mg total) by mouth daily. 30 tablet 6  . metFORMIN (GLUCOPHAGE) 1000 MG tablet Take 1 tablet (1,000 mg total) by mouth 2 (two) times daily. 60 tablet 6  . GLIPIZIDE XL 5 MG 24 hr tablet TAKE 1 TABLET BY MOUTH ONCE DAILY 30 tablet 0  . lisinopril (PRINIVIL,ZESTRIL) 5 MG tablet TAKE 1 TABLET BY MOUTH ONCE DAILY 90 tablet 0  . lovastatin (MEVACOR) 40 MG tablet TAKE 1 TABLET BY MOUTH ONCE DAILY 30 tablet 0  . metFORMIN (GLUCOPHAGE) 1000 MG tablet TAKE 1 TABLET BY MOUTH TWICE DAILY SCHEDULE  APPT 14 tablet 0  . traMADol (ULTRAM) 50 MG tablet Take 1 tablet (50 mg total) by mouth every 8 (eight) hours as needed. 30 tablet 0   No facility-administered medications prior to visit.     Review of Systems  Constitutional: Negative for chills, fatigue, fever, malaise/fatigue and weight loss.  HENT: Negative for ear discharge, ear pain and sore throat.   Eyes: Negative for blurred vision.  Respiratory: Negative for cough, sputum production, shortness of breath and wheezing.   Cardiovascular: Negative for chest pain, palpitations, orthopnea, leg swelling and PND.  Gastrointestinal: Negative for abdominal pain, blood in stool, constipation, diarrhea, heartburn, melena and nausea.  Genitourinary: Negative for dysuria, frequency, hematuria and urgency.  Musculoskeletal: Negative for back pain, joint pain, myalgias and neck pain.  Skin: Negative for pallor and rash.  Neurological: Negative for dizziness, tingling, sensory change, focal weakness, weakness and headaches.  Endo/Heme/Allergies: Negative for environmental allergies, polydipsia and polyphagia. Does not  bruise/bleed easily.  Psychiatric/Behavioral: Negative for confusion, depression and suicidal ideas. The patient is not nervous/anxious and does not have insomnia.      Objective  Vitals:   01/07/17 1028  BP: 130/80  Pulse: 80  Weight: 157 lb (71.2 kg)  Height: 5\' 6"  (1.676 m)    Physical Exam  Constitutional: She is well-developed, well-nourished, and in no distress. No distress.  HENT:  Head: Normocephalic and atraumatic.  Right Ear: Tympanic membrane and external ear normal.  Left Ear: Tympanic membrane and external ear normal.  Nose: Nose normal.  Mouth/Throat: Oropharynx is clear and moist.  Eyes: Pupils are equal, round, and reactive to light. Conjunctivae and EOM are normal. Right eye exhibits no discharge. Left eye exhibits no discharge.  Neck: Normal range of motion. Neck supple. No JVD present. No thyromegaly present.  Cardiovascular: Normal rate, regular rhythm, S1 normal, S2 normal, normal heart sounds and intact distal pulses.  Exam reveals no gallop, no S3, no S4 and no friction rub.   No murmur heard. Pulmonary/Chest: Effort normal and breath sounds normal. She has no wheezes. She has no rales.  Abdominal: Soft. Bowel sounds are normal. She exhibits no mass. There is no hepatosplenomegaly. There is no tenderness. There is no guarding and no CVA tenderness.  Musculoskeletal:  Normal range of motion. She exhibits no edema.  Lymphadenopathy:    She has no cervical adenopathy.  Neurological: She is alert. She has normal reflexes.  Skin: Skin is warm and dry. She is not diaphoretic.  Psychiatric: Mood and affect normal.  Nursing note and vitals reviewed.     Assessment & Plan  Problem List Items Addressed This Visit      Cardiovascular and Mediastinum   Essential hypertension   Relevant Medications   lisinopril (PRINIVIL,ZESTRIL) 5 MG tablet   lovastatin (MEVACOR) 40 MG tablet   Other Relevant Orders   Renal Function Panel     Endocrine   Type 2 diabetes  mellitus (HCC) - Primary   Relevant Medications   lisinopril (PRINIVIL,ZESTRIL) 5 MG tablet   lovastatin (MEVACOR) 40 MG tablet   metFORMIN (GLUCOPHAGE) 1000 MG tablet   glipiZIDE (GLIPIZIDE XL) 5 MG 24 hr tablet   Other Relevant Orders   Renal Function Panel   Hemoglobin A1c   Microalbumin / creatinine urine ratio     Other   Hypercholesterolemia   Relevant Medications   lisinopril (PRINIVIL,ZESTRIL) 5 MG tablet   lovastatin (MEVACOR) 40 MG tablet   metFORMIN (GLUCOPHAGE) 1000 MG tablet   Other Relevant Orders   Lipid Profile   Need for vaccination against Streptococcus pneumoniae using pneumococcal conjugate vaccine 13    Other Visit Diagnoses    Influenza vaccine needed       Relevant Orders   Flu vaccine HIGH DOSE PF (Completed)      Meds ordered this encounter  Medications  . lisinopril (PRINIVIL,ZESTRIL) 5 MG tablet    Sig: Take 1 tablet (5 mg total) by mouth daily.    Dispense:  90 tablet    Refill:  1  . lovastatin (MEVACOR) 40 MG tablet    Sig: Take 1 tablet (40 mg total) by mouth daily.    Dispense:  90 tablet    Refill:  1  . metFORMIN (GLUCOPHAGE) 1000 MG tablet    Sig: Take 1 tablet (1,000 mg total) by mouth 2 (two) times daily.    Dispense:  180 tablet    Refill:  1    sched appt  . glipiZIDE (GLIPIZIDE XL) 5 MG 24 hr tablet    Sig: Take 1 tablet (5 mg total) by mouth daily.    Dispense:  90 tablet    Refill:  1    Please consider 90 day supplies to promote better adherence      Dr. Otilio Miu [facility]  01/07/17

## 2017-05-06 ENCOUNTER — Encounter: Payer: Self-pay | Admitting: Family Medicine

## 2017-05-06 ENCOUNTER — Ambulatory Visit (INDEPENDENT_AMBULATORY_CARE_PROVIDER_SITE_OTHER): Payer: Medicare Other | Admitting: Family Medicine

## 2017-05-06 VITALS — BP 120/62 | HR 64 | Ht 67.0 in | Wt 153.0 lb

## 2017-05-06 DIAGNOSIS — J40 Bronchitis, not specified as acute or chronic: Secondary | ICD-10-CM

## 2017-05-06 MED ORDER — AZITHROMYCIN 250 MG PO TABS: ORAL_TABLET | ORAL | 0 refills | Status: DC

## 2017-05-06 NOTE — Progress Notes (Signed)
Name: Cristina Morgan   MRN: [id number]    DOB: 09-Sep-1934   Date:05/06/2017       Progress Note  Subjective  Chief Complaint  Chief Complaint  Patient presents with  . Sinusitis    cough- little production/ gets worse at night. R) ear pops when she coughs    Sinusitis  This is a new problem. The current episode started 1 to 4 weeks ago (2 wks). The problem has been gradually worsening since onset. There has been no fever. The pain is mild. Associated symptoms include congestion, coughing, headaches and sinus pressure. Pertinent negatives include no chills, diaphoresis, ear pain, hoarse voice, neck pain, shortness of breath or sore throat. (Frontal headache) Treatments tried: garlic/corcedin. The treatment provided mild relief.  Cough  This is a new problem. The current episode started 1 to 4 weeks ago (2weeks). The problem has been gradually worsening. The problem occurs every few minutes. The cough is non-productive. Associated symptoms include headaches and nasal congestion. Pertinent negatives include no chest pain, chills, ear congestion, ear pain, fever, heartburn, myalgias, postnasal drip, rash, sore throat, shortness of breath, weight loss or wheezing. She has tried OTC cough suppressant for the symptoms. The treatment provided no relief. There is no history of asthma, bronchiectasis, bronchitis, COPD, emphysema, environmental allergies or pneumonia.    No problem-specific Assessment & Plan notes found for this encounter.   Past Medical History:  Diagnosis Date  . Diabetes mellitus without complication ([place])   . Hyperlipidemia   . Hypertension     Past Surgical History:  Procedure Laterality Date  . CATARACT EXTRACTION Bilateral 2014  . COLONOSCOPY  2011   cleared for 5 yrs- [facility]  . ECTOPIC PREGNANCY SURGERY      Family History  Problem Relation Age of Onset  . Cancer Mother   . Diabetes Father   . Heart disease Father     Social History   Socioeconomic History  .  Marital status: Married    Spouse name: Not on file  . Number of children: Not on file  . Years of education: Not on file  . Highest education level: Not on file  Social Needs  . Financial resource strain: Not on file  . Food insecurity - worry: Not on file  . Food insecurity - inability: Not on file  . Transportation needs - medical: Not on file  . Transportation needs - non-medical: Not on file  Occupational History  . Not on file  Tobacco Use  . Smoking status: Never Smoker  . Smokeless tobacco: Never Used  Substance and Sexual Activity  . Alcohol use: No    Alcohol/week: 0.0 oz  . Drug use: No  . Sexual activity: No  Other Topics Concern  . Not on file  Social History Narrative  . Not on file    Allergies  Allergen Reactions  . Penicillins Other (See Comments)    Outpatient Medications Prior to Visit  Medication Sig Dispense Refill  . aspirin 81 MG chewable tablet Chew 1 tablet (81 mg total) by mouth daily. 100 tablet 3  . glipiZIDE (GLIPIZIDE XL) 5 MG 24 hr tablet Take 1 tablet (5 mg total) by mouth daily. 90 tablet 1  . lisinopril (PRINIVIL,ZESTRIL) 5 MG tablet Take 1 tablet (5 mg total) by mouth daily. 90 tablet 1  . lovastatin (MEVACOR) 40 MG tablet Take 1 tablet (40 mg total) by mouth daily. 90 tablet 1  . metFORMIN (GLUCOPHAGE) 1000 MG tablet Take 1 tablet (  1,000 mg total) by mouth 2 (two) times daily. 180 tablet 1  . Omega-3 Fatty Acids (FISH OIL) 1000 MG CAPS Take 1 capsule (1,000 mg total) by mouth daily. 100 capsule 3   No facility-administered medications prior to visit.     Review of Systems  Constitutional: Negative for chills, diaphoresis, fever, malaise/fatigue and weight loss.  HENT: Positive for congestion and sinus pressure. Negative for ear discharge, ear pain, hoarse voice, postnasal drip and sore throat.   Eyes: Negative for blurred vision.  Respiratory: Positive for cough. Negative for sputum production, shortness of breath and wheezing.    Cardiovascular: Negative for chest pain, palpitations and leg swelling.  Gastrointestinal: Negative for abdominal pain, blood in stool, constipation, diarrhea, heartburn, melena and nausea.  Genitourinary: Negative for dysuria, frequency, hematuria and urgency.  Musculoskeletal: Negative for back pain, joint pain, myalgias and neck pain.  Skin: Negative for rash.  Neurological: Positive for headaches. Negative for dizziness, tingling, sensory change and focal weakness.  Endo/Heme/Allergies: Negative for environmental allergies and polydipsia. Does not bruise/bleed easily.  Psychiatric/Behavioral: Negative for depression and suicidal ideas. The patient is not nervous/anxious and does not have insomnia.      Objective  Vitals:   05/06/17 1041  BP: 120/62  Pulse: 64  Weight: 153 lb (69.4 kg)  Height: 5\' 7"  (1.702 m)    Physical Exam  Constitutional: She is well-developed, well-nourished, and in no distress. No distress.  HENT:  Head: Normocephalic and atraumatic.  Right Ear: External ear normal.  Left Ear: External ear normal.  Nose: Nose normal. Right sinus exhibits no frontal sinus tenderness. Left sinus exhibits no maxillary sinus tenderness and no frontal sinus tenderness.  Mouth/Throat: Posterior oropharyngeal erythema present. No oropharyngeal exudate or posterior oropharyngeal edema.  Eyes: Conjunctivae and EOM are normal. Pupils are equal, round, and reactive to light. Right eye exhibits no discharge. Left eye exhibits no discharge.  Neck: Normal range of motion. Neck supple. No JVD present. No thyromegaly present.  Cardiovascular: Normal rate, regular rhythm, normal heart sounds and intact distal pulses. Exam reveals no gallop and no friction rub.  No murmur heard. Pulmonary/Chest: Effort normal. She has wheezes. She has no rales.  Abdominal: Soft. Bowel sounds are normal. She exhibits no mass. There is no tenderness. There is no guarding.  Musculoskeletal: Normal range of  motion. She exhibits no edema.  Lymphadenopathy:       Head (left side): Submandibular adenopathy present.    She has no cervical adenopathy.  Neurological: She is alert. She has normal reflexes.  Skin: Skin is warm and dry. She is not diaphoretic.  Psychiatric: Mood and affect normal.  Nursing note and vitals reviewed.     Assessment & Plan  Problem List Items Addressed This Visit    None    Visit Diagnoses    Bronchitis    -  Primary   Relevant Medications   azithromycin (ZITHROMAX) 250 MG tablet      Meds ordered this encounter  Medications  . azithromycin (ZITHROMAX) 250 MG tablet    Sig: 2 today then 1 a day for 4 days    Dispense:  6 tablet    Refill:  0      Dr. [facility]  05/06/17

## 2017-06-23 ENCOUNTER — Other Ambulatory Visit: Payer: Self-pay | Admitting: Family Medicine

## 2017-06-23 DIAGNOSIS — I10 Essential (primary) hypertension: Secondary | ICD-10-CM

## 2017-06-24 DIAGNOSIS — E113393 Type 2 diabetes mellitus with moderate nonproliferative diabetic retinopathy without macular edema, bilateral: Secondary | ICD-10-CM | POA: Diagnosis not present

## 2017-07-11 ENCOUNTER — Other Ambulatory Visit: Payer: Self-pay | Admitting: Family Medicine

## 2017-07-11 DIAGNOSIS — E7800 Pure hypercholesterolemia, unspecified: Secondary | ICD-10-CM

## 2017-07-11 DIAGNOSIS — E119 Type 2 diabetes mellitus without complications: Secondary | ICD-10-CM

## 2017-07-15 LAB — HM DIABETES EYE EXAM

## 2017-07-19 ENCOUNTER — Other Ambulatory Visit: Payer: Self-pay | Admitting: Family Medicine

## 2017-07-19 DIAGNOSIS — E7800 Pure hypercholesterolemia, unspecified: Secondary | ICD-10-CM

## 2017-08-24 ENCOUNTER — Encounter: Payer: Self-pay | Admitting: Family Medicine

## 2017-08-24 ENCOUNTER — Ambulatory Visit (INDEPENDENT_AMBULATORY_CARE_PROVIDER_SITE_OTHER): Payer: Medicare Other | Admitting: Family Medicine

## 2017-08-24 VITALS — BP 120/70 | HR 76 | Ht 67.0 in | Wt 156.0 lb

## 2017-08-24 DIAGNOSIS — T464X5A Adverse effect of angiotensin-converting-enzyme inhibitors, initial encounter: Secondary | ICD-10-CM

## 2017-08-24 DIAGNOSIS — Z780 Asymptomatic menopausal state: Secondary | ICD-10-CM | POA: Diagnosis not present

## 2017-08-24 DIAGNOSIS — R05 Cough: Secondary | ICD-10-CM

## 2017-08-24 DIAGNOSIS — E119 Type 2 diabetes mellitus without complications: Secondary | ICD-10-CM | POA: Diagnosis not present

## 2017-08-24 DIAGNOSIS — E7800 Pure hypercholesterolemia, unspecified: Secondary | ICD-10-CM

## 2017-08-24 DIAGNOSIS — I10 Essential (primary) hypertension: Secondary | ICD-10-CM

## 2017-08-24 DIAGNOSIS — R058 Other specified cough: Secondary | ICD-10-CM

## 2017-08-24 MED ORDER — LOVASTATIN 40 MG PO TABS: 40.0000 mg | ORAL_TABLET | Freq: Every day | ORAL | 1 refills | Status: DC

## 2017-08-24 MED ORDER — FISH OIL 1000 MG PO CAPS: 1.0000 | ORAL_CAPSULE | Freq: Every day | ORAL | 3 refills | Status: DC

## 2017-08-24 MED ORDER — METFORMIN HCL 1000 MG PO TABS: 1000.0000 mg | ORAL_TABLET | Freq: Two times a day (BID) | ORAL | 1 refills | Status: DC

## 2017-08-24 MED ORDER — GLIPIZIDE ER 5 MG PO TB24: 5.0000 mg | ORAL_TABLET | Freq: Every day | ORAL | 1 refills | Status: DC

## 2017-08-24 MED ORDER — LOSARTAN POTASSIUM 25 MG PO TABS: 25.0000 mg | ORAL_TABLET | Freq: Every day | ORAL | 1 refills | Status: DC

## 2017-08-24 NOTE — Assessment & Plan Note (Signed)
No changes in medications glipizide 5 mg and metformen 1 gm bid.  will check A1C and renal panel

## 2017-08-24 NOTE — Progress Notes (Signed)
Name: Cristina Morgan   MRN: [id number]    DOB: May 16, 1934   Date:08/24/2017       Progress Note  Subjective  Chief Complaint  Chief Complaint  Patient presents with  . Diabetes  . Hyperlipidemia  . Hypertension    dry cough/ not productive    Diabetes  She presents for her follow-up diabetic visit. She has type 2 diabetes mellitus. Her disease course has been stable. There are no hypoglycemic associated symptoms. Pertinent negatives for hypoglycemia include no confusion, dizziness, headaches, nervousness/anxiousness, sleepiness or sweats. There are no diabetic associated symptoms. Pertinent negatives for diabetes include no blurred vision, no chest pain, no fatigue, no foot paresthesias, no foot ulcerations, no polydipsia, no polyphagia, no polyuria, no visual change, no weakness and no weight loss. There are no hypoglycemic complications. Symptoms are stable. Pertinent negatives for diabetic complications include no autonomic neuropathy, CVA, heart disease, impotence, nephropathy, peripheral neuropathy, PVD or retinopathy. Risk factors for coronary artery disease include dyslipidemia, diabetes mellitus, hypertension and female sex. Current diabetic treatment includes oral agent (dual therapy) (metformin and glipizide). She is compliant with treatment all of the time. She is following a generally healthy diet. Her home blood glucose trend is fluctuating minimally. Her breakfast blood glucose is taken between 8-9 am. Her breakfast blood glucose range is generally 110-130 mg/dl. An ACE inhibitor/angiotensin II receptor blocker is being taken. She does not see a podiatrist.Eye exam is current.  Hyperlipidemia  This is a chronic problem. The current episode started more than 1 year ago. The problem is controlled. Recent lipid tests were reviewed and are normal. She has no history of chronic renal disease, diabetes, hypothyroidism, liver disease, obesity or nephrotic syndrome. Pertinent negatives include  no chest pain, focal sensory loss, focal weakness, leg pain, myalgias or shortness of breath. Current antihyperlipidemic treatment includes statins (omega 3). The current treatment provides moderate improvement of lipids. There are no compliance problems.  Risk factors for coronary artery disease include diabetes mellitus, dyslipidemia and hypertension.  Hypertension  This is a chronic problem. The current episode started more than 1 year ago. The problem is controlled. Pertinent negatives include no anxiety, blurred vision, chest pain, headaches, malaise/fatigue, neck pain, orthopnea, palpitations, peripheral edema, PND, shortness of breath or sweats. Agents associated with hypertension include steroids. Risk factors for coronary artery disease include dyslipidemia, diabetes mellitus and post-menopausal state. Past treatments include ACE inhibitors. The current treatment provides moderate improvement. There are no compliance problems.  There is no history of angina, kidney disease, CAD/MI, CVA, heart failure, left ventricular hypertrophy, PVD or retinopathy. There is no history of chronic renal disease, a hypertension causing med or renovascular disease.  Cough  This is a recurrent problem. The current episode started more than 1 month ago. Progression since onset: intermitant. The problem occurs hourly. The cough is non-productive. Pertinent negatives include no chest pain, chills, ear congestion, ear pain, fever, headaches, heartburn, myalgias, nasal congestion, postnasal drip, rash, rhinorrhea, sore throat, shortness of breath, sweats, weight loss or wheezing. Associated symptoms comments: Did not have cough when out of lisinopril. Exacerbated by: ?ACE. She has tried nothing for the symptoms. There is no history of environmental allergies.    Hypercholesterolemia Controlled on Lovaststin 40 mg. Will check lipid panel.  Type 2 diabetes mellitus (HCC) No changes in medications glipizide 5 mg and  metformen 1 gm bid.  will check A1C and renal panel   Past Medical History:  Diagnosis Date  . Diabetes mellitus without complication ([place])   .  Hyperlipidemia   . Hypertension     Past Surgical History:  Procedure Laterality Date  . CATARACT EXTRACTION Bilateral 2014  . COLONOSCOPY  2011   cleared for 5 yrs- [facility]  . ECTOPIC PREGNANCY SURGERY      Family History  Problem Relation Age of Onset  . Cancer Mother   . Diabetes Father   . Heart disease Father     Social History   Socioeconomic History  . Marital status: Married    Spouse name: Not on file  . Number of children: Not on file  . Years of education: Not on file  . Highest education level: Not on file  Occupational History  . Not on file  Social Needs  . Financial resource strain: Not on file  . Food insecurity:    Worry: Not on file    Inability: Not on file  . Transportation needs:    Medical: Not on file    Non-medical: Not on file  Tobacco Use  . Smoking status: Never Smoker  . Smokeless tobacco: Never Used  Substance and Sexual Activity  . Alcohol use: No    Alcohol/week: 0.0 oz  . Drug use: No  . Sexual activity: Never  Lifestyle  . Physical activity:    Days per week: Not on file    Minutes per session: Not on file  . Stress: Not on file  Relationships  . Social connections:    Talks on phone: Not on file    Gets together: Not on file    Attends religious service: Not on file    Active member of club or organization: Not on file    Attends meetings of clubs or organizations: Not on file    Relationship status: Not on file  . Intimate partner violence:    Fear of current or ex partner: Not on file    Emotionally abused: Not on file    Physically abused: Not on file    Forced sexual activity: Not on file  Other Topics Concern  . Not on file  Social History Narrative  . Not on file    Allergies  Allergen Reactions  . Penicillins Other (See Comments)    Outpatient Medications  Prior to Visit  Medication Sig Dispense Refill  . aspirin 81 MG chewable tablet Chew 1 tablet (81 mg total) by mouth daily. 100 tablet 3  . glipiZIDE (GLIPIZIDE XL) 5 MG 24 hr tablet Take 1 tablet (5 mg total) by mouth daily. 90 tablet 1  . lisinopril (PRINIVIL,ZESTRIL) 5 MG tablet TAKE 1 TABLET BY MOUTH ONCE DAILY 90 tablet 0  . lovastatin (MEVACOR) 40 MG tablet TAKE 1 TABLET BY MOUTH ONCE DAILY 30 tablet 0  . metFORMIN (GLUCOPHAGE) 1000 MG tablet TAKE 1 TABLET BY MOUTH TWICE DAILY 180 tablet 1  . Omega-3 Fatty Acids (FISH OIL) 1000 MG CAPS Take 1 capsule (1,000 mg total) by mouth daily. 100 capsule 3  . azithromycin (ZITHROMAX) 250 MG tablet 2 today then 1 a day for 4 days 6 tablet 0   No facility-administered medications prior to visit.     Review of Systems  Constitutional: Negative for chills, fatigue, fever, malaise/fatigue and weight loss.  HENT: Negative for ear discharge, ear pain, postnasal drip, rhinorrhea and sore throat.   Eyes: Negative for blurred vision.  Respiratory: Positive for cough. Negative for sputum production, shortness of breath and wheezing.   Cardiovascular: Negative for chest pain, palpitations, orthopnea, leg swelling and PND.  Gastrointestinal:  Negative for abdominal pain, blood in stool, constipation, diarrhea, heartburn, melena and nausea.  Genitourinary: Negative for dysuria, frequency, hematuria, impotence and urgency.  Musculoskeletal: Negative for back pain, joint pain, myalgias and neck pain.  Skin: Negative for rash.  Neurological: Negative for dizziness, tingling, sensory change, focal weakness, weakness and headaches.  Endo/Heme/Allergies: Negative for environmental allergies, polydipsia and polyphagia. Does not bruise/bleed easily.  Psychiatric/Behavioral: Negative for confusion, depression and suicidal ideas. The patient is not nervous/anxious and does not have insomnia.      Objective  Vitals:   08/24/17 0925  BP: 120/70  Pulse: 76   Weight: 156 lb (70.8 kg)  Height: 5\' 7"  (1.702 m)    Physical Exam  Constitutional: No distress.  HENT:  Head: Normocephalic and atraumatic.  Right Ear: Hearing, tympanic membrane, external ear and ear canal normal.  Left Ear: Hearing, tympanic membrane, external ear and ear canal normal.  Nose: Nose normal. No mucosal edema.  Mouth/Throat: Uvula is midline and oropharynx is clear and moist. No oropharyngeal exudate, posterior oropharyngeal edema or posterior oropharyngeal erythema.  Eyes: Pupils are equal, round, and reactive to light. Conjunctivae, EOM and lids are normal. Right eye exhibits no discharge. Left eye exhibits no discharge.  Neck: Trachea normal and normal range of motion. Neck supple. Normal carotid pulses, no hepatojugular reflux and no JVD present. Carotid bruit is not present. No thyroid mass and no thyromegaly present.  Cardiovascular: Normal rate, regular rhythm, S1 normal, S2 normal, normal heart sounds and intact distal pulses. Exam reveals no gallop, no S3, no S4 and no friction rub.  No murmur heard.  No systolic murmur is present.  No diastolic murmur is present. Pulses:      Carotid pulses are 2+ on the right side, and 2+ on the left side.      Radial pulses are 2+ on the right side, and 2+ on the left side.       Femoral pulses are 2+ on the right side, and 2+ on the left side.      Popliteal pulses are 2+ on the right side, and 2+ on the left side.       Dorsalis pedis pulses are 2+ on the right side, and 2+ on the left side.       Posterior tibial pulses are 2+ on the right side, and 2+ on the left side.  Pulmonary/Chest: Effort normal and breath sounds normal. She has no decreased breath sounds.  Abdominal: Soft. Bowel sounds are normal. She exhibits no mass. There is no hepatosplenomegaly. There is no tenderness. There is no guarding.  Musculoskeletal: Normal range of motion. She exhibits no edema.  Lymphadenopathy:       Head (right side): No  submandibular adenopathy present.       Head (left side): No submandibular adenopathy present.    She has no cervical adenopathy.  Neurological: She is alert. She has normal reflexes.  Skin: Skin is warm and dry. She is not diaphoretic.  Nursing note and vitals reviewed.     Assessment & Plan  Problem List Items Addressed This Visit      Cardiovascular and Mediastinum   Essential hypertension   Relevant Medications   lovastatin (MEVACOR) 40 MG tablet   losartan (COZAAR) 25 MG tablet   Other Relevant Orders   Renal Function Panel     Endocrine   Type 2 diabetes mellitus ([place]) - Primary    No changes in medications glipizide 5 mg and metformen 1 gm  bid.  will check A1C and renal panel      Relevant Medications   lovastatin (MEVACOR) 40 MG tablet   metFORMIN (GLUCOPHAGE) 1000 MG tablet   glipiZIDE (GLIPIZIDE XL) 5 MG 24 hr tablet   losartan (COZAAR) 25 MG tablet   Other Relevant Orders   Renal Function Panel   Hemoglobin A1c     Other   Hypercholesterolemia    Controlled on Lovaststin 40 mg. Will check lipid panel.      Relevant Medications   lovastatin (MEVACOR) 40 MG tablet   Omega-3 Fatty Acids (FISH OIL) 1000 MG CAPS   metFORMIN (GLUCOPHAGE) 1000 MG tablet   losartan (COZAAR) 25 MG tablet   Other Relevant Orders   Lipid panel    Other Visit Diagnoses    Cough due to ACE inhibitor       will discontinue lisinopril and replace with losartin 25mg  daily   Relevant Medications   losartan (COZAAR) 25 MG tablet   Menopause       Will check Dexascan to evaluate bone density status.   Relevant Orders   DG Bone Density      Meds ordered this encounter  Medications  . lovastatin (MEVACOR) 40 MG tablet    Sig: Take 1 tablet (40 mg total) by mouth daily.    Dispense:  90 tablet    Refill:  1    Needs appt  . Omega-3 Fatty Acids (FISH OIL) 1000 MG CAPS    Sig: Take 1 capsule (1,000 mg total) by mouth daily.    Dispense:  100 capsule    Refill:  3  .  metFORMIN (GLUCOPHAGE) 1000 MG tablet    Sig: Take 1 tablet (1,000 mg total) by mouth 2 (two) times daily.    Dispense:  180 tablet    Refill:  1  . glipiZIDE (GLIPIZIDE XL) 5 MG 24 hr tablet    Sig: Take 1 tablet (5 mg total) by mouth daily.    Dispense:  90 tablet    Refill:  1    Please consider 90 day supplies to promote better adherence  . losartan (COZAAR) 25 MG tablet    Sig: Take 1 tablet (25 mg total) by mouth daily.    Dispense:  90 tablet    Refill:  1      Dr. Otilio Miu [facility]  08/24/17

## 2017-08-24 NOTE — Assessment & Plan Note (Signed)
Controlled on Lovaststin 40 mg. Will check lipid panel.

## 2017-08-25 LAB — RENAL FUNCTION PANEL
ALBUMIN: 4.5 g/dL (ref 3.5–4.7)
BUN / CREAT RATIO: 20 (ref 12–28)
BUN: 14 mg/dL (ref 8–27)
CHLORIDE: 102 mmol/L (ref 96–106)
CO2: 23 mmol/L (ref 20–29)
Calcium: 9.7 mg/dL (ref 8.7–10.3)
Creatinine, Ser: 0.71 mg/dL (ref 0.57–1.00)
GFR, EST AFRICAN AMERICAN: 92 mL/min/{1.73_m2} (ref >59)
GFR, EST NON AFRICAN AMERICAN: 80 mL/min/{1.73_m2} (ref >59)
GLUCOSE: 126 mg/dL — AB (ref 65–99)
POTASSIUM: 4.3 mmol/L (ref 3.5–5.2)
Phosphorus: 3.2 mg/dL (ref 2.5–4.5)
SODIUM: 140 mmol/L (ref 134–144)

## 2017-08-25 LAB — HEMOGLOBIN A1C
Est. average glucose Bld gHb Est-mCnc: 157 mg/dL
Hgb A1c MFr Bld: 7.1 % — ABNORMAL HIGH (ref 4.8–5.6)

## 2017-08-25 LAB — LIPID PANEL
CHOL/HDL RATIO: 2.4 ratio (ref 0.0–4.4)
Cholesterol, Total: 192 mg/dL (ref 100–199)
HDL: 80 mg/dL (ref >39)
LDL Calculated: 101 mg/dL — ABNORMAL HIGH (ref 0–99)
Triglycerides: 55 mg/dL (ref 0–149)
VLDL CHOLESTEROL CAL: 11 mg/dL (ref 5–40)

## 2017-08-31 ENCOUNTER — Inpatient Hospital Stay: Admission: RE | Admit: 2017-08-31 | Payer: 59 | Source: Ambulatory Visit

## 2017-09-07 ENCOUNTER — Ambulatory Visit (INDEPENDENT_AMBULATORY_CARE_PROVIDER_SITE_OTHER): Payer: Medicare Other

## 2017-09-07 VITALS — BP 130/72 | HR 67 | Temp 97.9°F | Resp 12 | Ht 67.0 in | Wt 156.4 lb

## 2017-09-07 DIAGNOSIS — Z0000 Encounter for general adult medical examination without abnormal findings: Secondary | ICD-10-CM | POA: Diagnosis not present

## 2017-09-07 NOTE — Progress Notes (Signed)
Subjective:   Cristina Morgan is a 83 y.o. female who presents for an Initial Medicare Annual Wellness Visit.  Review of Systems    N/A  Cardiac Risk Factors include: advanced age (>30men, >23 women);diabetes mellitus;dyslipidemia;hypertension;sedentary lifestyle;family history of premature cardiovascular disease     Objective:    Today's Vitals   09/07/17 0906  BP: 130/72  Pulse: 67  Resp: 12  Temp: 97.9 F (36.6 C)  TempSrc: Oral  SpO2: 95%  Weight: 156 lb 6.4 oz (70.9 kg)  Height: 5\' 7"  (1.702 m)   Body mass index is 24.5 kg/m.  Advanced Directives 09/07/2017 01/03/2015  Does Patient Have a Medical Advance Directive? No Yes  Type of Advance Directive - [place] in Chart? - No - copy requested  Would patient like information on creating a medical advance directive? Yes (MAU/Ambulatory/Procedural Areas - Information given) -    Current Medications (verified) Outpatient Encounter Medications as of 09/07/2017  Medication Sig  . aspirin 81 MG chewable tablet Chew 1 tablet (81 mg total) by mouth daily.  Marland Kitchen glipiZIDE (GLIPIZIDE XL) 5 MG 24 hr tablet Take 1 tablet (5 mg total) by mouth daily.  Marland Kitchen losartan (COZAAR) 25 MG tablet Take 1 tablet (25 mg total) by mouth daily.  Marland Kitchen lovastatin (MEVACOR) 40 MG tablet Take 1 tablet (40 mg total) by mouth daily.  . metFORMIN (GLUCOPHAGE) 1000 MG tablet Take 1 tablet (1,000 mg total) by mouth 2 (two) times daily.  . Omega-3 Fatty Acids (FISH OIL) 1000 MG CAPS Take 1 capsule (1,000 mg total) by mouth daily.   No facility-administered encounter medications on file as of 09/07/2017.     Allergies (verified) Penicillins   History: Past Medical History:  Diagnosis Date  . Diabetes mellitus without complication ([place])   . Hyperlipidemia   . Hypertension    Past Surgical History:  Procedure Laterality Date  . CATARACT EXTRACTION Bilateral 2014  . COLONOSCOPY  2011   cleared for  5 yrs- [facility]  . ECTOPIC PREGNANCY SURGERY     Family History  Problem Relation Age of Onset  . Cancer Mother        breast  . Heart disease Mother   . Diabetes Father   . Heart disease Father   . Heart attack Father   . Heart attack Brother   . Diabetes Brother    Social History   Socioeconomic History  . Marital status: Married    Spouse name: Not on file  . Number of children: 2  . Years of education: Not on file  . Highest education level: 12th grade  Occupational History  . Occupation: Retired  Scientific laboratory technician  . Financial resource strain: Not hard at all  . Food insecurity:    Worry: Never true    Inability: Never true  . Transportation needs:    Medical: No    Non-medical: No  Tobacco Use  . Smoking status: Never Smoker  . Smokeless tobacco: Never Used  . Tobacco comment: smoking cessation materials not required  Substance and Sexual Activity  . Alcohol use: No    Alcohol/week: 0.0 oz  . Drug use: No  . Sexual activity: Never  Lifestyle  . Physical activity:    Days per week: 0 days    Minutes per session: 0 min  . Stress: Not at all  Relationships  . Social connections:    Talks on phone: Patient refused  Gets together: Patient refused    Attends religious service: Patient refused    Active member of club or organization: Patient refused    Attends meetings of clubs or organizations: Patient refused    Relationship status: Married  Other Topics Concern  . Not on file  Social History Narrative  . Not on file    Tobacco Counseling Counseling given: No Comment: smoking cessation materials not required  Clinical Intake:  Pre-visit preparation completed: Yes  Pain : No/denies pain   BMI - recorded: 24.5 Nutritional Status: BMI of 19-24  Normal Nutritional Risks: None  Nutrition Risk Assessment: Has the patient had any N/V/D within the last 2 months?  No Does the patient have any non-healing wounds?  No Has the patient had any unintentional  weight loss or weight gain?  No  Is the patient diabetic?  Yes If diabetic, was a CBG obtained today?  No Did the patient bring in their glucometer from home?  No Comments: Pt monitors CBG's once per week. Denies any financial strains with the device or supplies.  Diabetic Exams: Diabetic Eye Exam: Completed 07/15/17. Diabetic Foot Exam: Completed 01/07/17.  How often do you need to have someone help you when you read instructions, pamphlets, or other written materials from your doctor or pharmacy?: 1 - Never  Interpreter Needed?: No  Information entered by :: AEversole, LPN  Activities of Daily Living In your present state of health, do you have any difficulty performing the following activities: 09/07/2017  Hearing? N  Comment Denies hearing aids  Vision? N  Comment wears eyeglasses  Difficulty concentrating or making decisions? N  Walking or climbing stairs? N  Dressing or bathing? N  Doing errands, shopping? N  Preparing Food and eating ? N  Comment full set upper and lower dentures  Using the Toilet? N  In the past six months, have you accidently leaked urine? N  Do you have problems with loss of bowel control? N  Managing your Medications? N  Managing your Finances? N  Housekeeping or managing your Housekeeping? N  Some recent data might be hidden     Immunizations and Health Maintenance Immunization History  Administered Date(s) Administered  . Influenza, High Dose Seasonal PF 01/07/2017  . Influenza,inj,Quad PF,6+ Mos 01/03/2015, 12/27/2015  . Pneumococcal Conjugate-13 01/07/2017   Health Maintenance Due  Topic Date Due  . DEXA SCAN  04/05/2001    Patient Care Team: Juline Patch, MD as PCP - General (Family Medicine) Golden Hurter, MD as Consulting Physician (Obstetrics and Gynecology)  Indicate any recent Medical Services you may have received from other than [facility] providers in the past year (date may be approximate).     Assessment:   This is a  routine wellness examination for Cristina Morgan.  Hearing/Vision screen Vision Screening Comments: Sees Dr. Gloriann Loan for annual eye exams  Dietary issues and exercise activities discussed: Current Exercise Habits: The patient does not participate in regular exercise at present, Exercise limited by: None identified  Goals    . DIET - INCREASE WATER INTAKE     Recommend to drink at least 6-8 8oz glasses of water per day.      Depression Screen PHQ 2/9 Scores 09/07/2017 08/24/2017 01/07/2017 05/05/2016 03/21/2015 01/03/2015  PHQ - 2 Score 0 0 0 0 0 0  PHQ- 9 Score 0 1 2 - - -    Fall Risk Fall Risk  09/07/2017 01/07/2017 05/05/2016 03/21/2015 01/03/2015  Falls in the past year? No No No No  Yes  Number falls in past yr: - - - - 1  Injury with Fall? - - - - No  Risk for fall due to : Impaired vision - - - -  Risk for fall due to: Comment wears eyeglasses - - - -  Follow up - - - - Falls evaluation completed    [place]: Is your home free of loose throw rugs in walkways, pet beds, electrical cords, etc? Yes Is there adequate lighting in your home to reduce risk of falls?  Yes Are there stairs in or around your home WITH handrails? Yes  ASSISTIVE DEVICES UTILIZED TO PREVENT FALLS: Use of a cane, walker or w/c? No Grab bars in the bathroom? No  Shower chair or a place to sit while bathing? No An elevated toilet seat or a handicapped toilet? Yes  Timed Get Up and Go Performed: Yes. Pt ambulated 10 feet within 8 sec. Gait stead-fast and without the use of an assistive device. No intervention required at this time. Fall risk prevention has been discussed.  Community Resource Referral:  Pt declined my offer to send [employer] Referral to Care Guide for installation of grab bars in the shower or a shower chair.  Cognitive Function:     6CIT Screen 09/07/2017  What Year? 0 points  What month? 0 points  What time? 0 points  Count back from 20 0 points  Months  in reverse 0 points  Repeat phrase 4 points  Total Score 4    Screening Tests Health Maintenance  Topic Date Due  . DEXA SCAN  04/05/2001  . TETANUS/TDAP  08/25/2018 (Originally 04/06/1953)  . INFLUENZA VACCINE  10/15/2017  . FOOT EXAM  01/07/2018  . PNA vac Low Risk Adult (2 of 2 - PPSV23) 01/07/2018  . HEMOGLOBIN A1C  02/23/2018  . OPHTHALMOLOGY EXAM  07/16/2018    Qualifies for Shingles Vaccine? Yes. Due for Shingrix. Education has been provided regarding the importance of this vaccine. Pt has been advised to call her insurance company to determine her out of pocket expense. Advised she may also receive this vaccine at her local pharmacy or Health Dept. Verbalized acceptance and understanding.  Due for Tdap vaccine. Education has been provided regarding the importance of this vaccine. Pt has been advised she may receive this vaccine at her local pharmacy or Health Dept. Also advised to provide a copy of her vaccination record if she chooses to receive this vaccine at her local pharmacy. Verbalized acceptance and understanding.  Cancer Screenings: Lung: Low Dose CT Chest recommended if Age 59-80 years, 30 pack-year currently smoking OR have quit w/in 15years. Patient does not qualify. Breast Screening: No longer required   Bone Density/Dexa: Ordered 08/24/17. Message sent to referral coordinator for scheduling purposes. Colorectal: No longer required  Additional Screenings: Hepatitis C Screening: Does not qualify   Plan:  I have personally reviewed and addressed the Medicare Annual Wellness questionnaire and have noted the following in the patient's chart:  A. Medical and social history B. Use of alcohol, tobacco or illicit drugs  C. Current medications and supplements D. Functional ability and status E.  Nutritional status F.  Physical activity G. Advance directives H. List of other physicians I.  Hospitalizations, surgeries, and ER visits in previous 12 months J.   [place] such as hearing and vision if needed, cognitive and depression L. Referrals and appointments  In addition, I have reviewed and discussed with patient certain preventive  protocols, quality metrics, and best practice recommendations. A written personalized care plan for preventive services as well as general preventive health recommendations were provided to patient.  Signed,  Aleatha Borer, LPN Nurse Health Advisor  MD Recommendations: Due for Shingrix. Education has been provided regarding the importance of this vaccine. Pt has been advised to call her insurance company to determine her out of pocket expense. Advised she may also receive this vaccine at her local pharmacy or Health Dept. Verbalized acceptance and understanding.  Due for Tdap vaccine. Education has been provided regarding the importance of this vaccine. Pt has been advised she may receive this vaccine at her local pharmacy or Health Dept. Also advised to provide a copy of her vaccination record if she chooses to receive this vaccine at her local pharmacy. Verbalized acceptance and understanding.  Bone Density/Dexa: Ordered 08/24/17. Message sent to referral coordinator for scheduling purposes.

## 2017-09-07 NOTE — Patient Instructions (Signed)
Cristina Morgan , Thank you for taking time to come for your Medicare Wellness Visit. I appreciate your ongoing commitment to your health goals. Please review the following plan we discussed and let me know if I can assist you in the future.   Screening recommendations/referrals: Colorectal Screening: No longer required Mammogram: No longer required Bone Density: You will receive a call from our office regarding your appointment  Vision and Dental Exams: Recommended annual ophthalmology exams for early detection of glaucoma and other disorders of the eye Recommended annual dental exams for proper oral hygiene  Diabetic Exams: Recommended annual diabetic eye exams for early detection of retinopathy Recommended annual diabetic foot exams for early detection of peripheral neuropathy.  Diabetic Eye Exam: Up to date Diabetic Foot Exam: Up to date  Vaccinations: Influenza vaccine: Up to date Pneumococcal vaccine: Up to date Tdap vaccine: Declined. Please call your insurance company to determine your out of pocket expense. You may also receive this vaccine at your local pharmacy or Health Dept. Shingles vaccine: Please call your insurance company to determine your out of pocket expense for the Shingrix vaccine. You may also receive this vaccine at your local pharmacy or Health Dept.  Advanced directives: Please bring a copy of your POA (Power of Attorney) and/or Living Will to your next appointment.  Goals: Recommend to drink at least 6-8 8oz glasses of water per day.  Next appointment: Please schedule your Annual Wellness Visit with your Nurse Health Advisor in one year.  Preventive Care 1 Years and Older, Female Preventive care refers to lifestyle choices and visits with your health care provider that can promote health and wellness. What does preventive care include?  A yearly physical exam. This is also called an annual well check.  Dental exams once or twice a year.  Routine eye exams.  Ask your health care provider how often you should have your eyes checked.  Personal lifestyle choices, including:  Daily care of your teeth and gums.  Regular physical activity.  Eating a healthy diet.  Avoiding tobacco and drug use.  Limiting alcohol use.  Practicing safe sex.  Taking low-dose aspirin every day.  Taking vitamin and mineral supplements as recommended by your health care provider. What happens during an annual well check? The services and screenings done by your health care provider during your annual well check will depend on your age, overall health, lifestyle risk factors, and family history of disease. Counseling  Your health care provider may ask you questions about your:  Alcohol use.  Tobacco use.  Drug use.  Emotional well-being.  Home and relationship well-being.  Sexual activity.  Eating habits.  History of falls.  Memory and ability to understand (cognition).  Work and work Statistician.  Reproductive health. Screening  You may have the following tests or measurements:  Height, weight, and BMI.  Blood pressure.  Lipid and cholesterol levels. These may be checked every 5 years, or more frequently if you are over 75 years old.  Skin check.  Lung cancer screening. You may have this screening every year starting at age 53 if you have a 30-pack-year history of smoking and currently smoke or have quit within the past 15 years.  Fecal occult blood test (FOBT) of the stool. You may have this test every year starting at age 74.  Flexible sigmoidoscopy or colonoscopy. You may have a sigmoidoscopy every 5 years or a colonoscopy every 10 years starting at age 23.  Hepatitis C blood test.  Hepatitis  B blood test.  Sexually transmitted disease (STD) testing.  Diabetes screening. This is done by checking your blood sugar (glucose) after you have not eaten for a while (fasting). You may have this done every 1-3 years.  Bone density  scan. This is done to screen for osteoporosis. You may have this done starting at age 32.  Mammogram. This may be done every 1-2 years. Talk to your health care provider about how often you should have regular mammograms. Talk with your health care provider about your test results, treatment options, and if necessary, the need for more tests. Vaccines  Your health care provider may recommend certain vaccines, such as:  Influenza vaccine. This is recommended every year.  Tetanus, diphtheria, and acellular pertussis (Tdap, Td) vaccine. You may need a Td booster every 10 years.  Zoster vaccine. You may need this after age 13.  Pneumococcal 13-valent conjugate (PCV13) vaccine. One dose is recommended after age 69.  Pneumococcal polysaccharide (PPSV23) vaccine. One dose is recommended after age 61. Talk to your health care provider about which screenings and vaccines you need and how often you need them. This information is not intended to replace advice given to you by your health care provider. Make sure you discuss any questions you have with your health care provider. Document Released: 03/30/2015 Document Revised: 11/21/2015 Document Reviewed: 01/02/2015 Elsevier Interactive Patient Education  2017 [place] Prevention in the Home Falls can cause injuries. They can happen to people of all ages. There are many things you can do to make your home safe and to help prevent falls. What can I do on the outside of my home?  Regularly fix the edges of walkways and driveways and fix any cracks.  Remove anything that might make you trip as you walk through a door, such as a raised step or threshold.  Trim any bushes or trees on the path to your home.  Use bright outdoor lighting.  Clear any walking paths of anything that might make someone trip, such as rocks or tools.  Regularly check to see if handrails are loose or broken. Make sure that both sides of any steps have  handrails.  Any raised decks and porches should have guardrails on the edges.  Have any leaves, snow, or ice cleared regularly.  Use sand or salt on walking paths during winter.  Clean up any spills in your garage right away. This includes oil or grease spills. What can I do in the bathroom?  Use night lights.  Install grab bars by the toilet and in the tub and shower. Do not use towel bars as grab bars.  Use non-skid mats or decals in the tub or shower.  If you need to sit down in the shower, use a plastic, non-slip stool.  Keep the floor dry. Clean up any water that spills on the floor as soon as it happens.  Remove soap buildup in the tub or shower regularly.  Attach bath mats securely with double-sided non-slip rug tape.  Do not have throw rugs and other things on the floor that can make you trip. What can I do in the bedroom?  Use night lights.  Make sure that you have a light by your bed that is easy to reach.  Do not use any sheets or blankets that are too big for your bed. They should not hang down onto the floor.  Have a firm chair that has side arms. You can use this for  support while you get dressed.  Do not have throw rugs and other things on the floor that can make you trip. What can I do in the kitchen?  Clean up any spills right away.  Avoid walking on wet floors.  Keep items that you use a lot in easy-to-reach places.  If you need to reach something above you, use a strong step stool that has a grab bar.  Keep electrical cords out of the way.  Do not use floor polish or wax that makes floors slippery. If you must use wax, use non-skid floor wax.  Do not have throw rugs and other things on the floor that can make you trip. What can I do with my stairs?  Do not leave any items on the stairs.  Make sure that there are handrails on both sides of the stairs and use them. Fix handrails that are broken or loose. Make sure that handrails are as long as  the stairways.  Check any carpeting to make sure that it is firmly attached to the stairs. Fix any carpet that is loose or worn.  Avoid having throw rugs at the top or bottom of the stairs. If you do have throw rugs, attach them to the floor with carpet tape.  Make sure that you have a light switch at the top of the stairs and the bottom of the stairs. If you do not have them, ask someone to add them for you. What else can I do to help prevent falls?  Wear shoes that:  Do not have high heels.  Have rubber bottoms.  Are comfortable and fit you well.  Are closed at the toe. Do not wear sandals.  If you use a stepladder:  Make sure that it is fully opened. Do not climb a closed stepladder.  Make sure that both sides of the stepladder are locked into place.  Ask someone to hold it for you, if possible.  Clearly mark and make sure that you can see:  Any grab bars or handrails.  First and last steps.  Where the edge of each step is.  Use tools that help you move around (mobility aids) if they are needed. These include:  Canes.  Walkers.  Scooters.  Crutches.  Turn on the lights when you go into a dark area. Replace any light bulbs as soon as they burn out.  Set up your furniture so you have a clear path. Avoid moving your furniture around.  If any of your floors are uneven, fix them.  If there are any pets around you, be aware of where they are.  Review your medicines with your doctor. Some medicines can make you feel dizzy. This can increase your chance of falling. Ask your doctor what other things that you can do to help prevent falls. This information is not intended to replace advice given to you by your health care provider. Make sure you discuss any questions you have with your health care provider. Document Released: 12/28/2008 Document Revised: 08/09/2015 Document Reviewed: 04/07/2014 Elsevier Interactive Patient Education  2017 [employer].

## 2017-09-16 ENCOUNTER — Other Ambulatory Visit: Payer: 59

## 2017-09-23 ENCOUNTER — Ambulatory Visit: Payer: Self-pay

## 2017-09-24 ENCOUNTER — Ambulatory Visit
Admission: RE | Admit: 2017-09-24 | Discharge: 2017-09-24 | Disposition: A | Discharge status: Home / Self Care | Payer: Medicare Other | Source: Ambulatory Visit | Attending: Family Medicine | Admitting: Family Medicine

## 2017-09-24 DIAGNOSIS — Z780 Asymptomatic menopausal state: Secondary | ICD-10-CM | POA: Diagnosis not present

## 2017-09-24 DIAGNOSIS — M8589 Other specified disorders of bone density and structure, multiple sites: Secondary | ICD-10-CM | POA: Diagnosis not present

## 2017-12-22 DIAGNOSIS — E113393 Type 2 diabetes mellitus with moderate nonproliferative diabetic retinopathy without macular edema, bilateral: Secondary | ICD-10-CM | POA: Diagnosis not present

## 2018-01-25 ENCOUNTER — Encounter: Payer: Self-pay | Admitting: Family Medicine

## 2018-01-25 ENCOUNTER — Ambulatory Visit (INDEPENDENT_AMBULATORY_CARE_PROVIDER_SITE_OTHER): Payer: Medicare Other | Admitting: Family Medicine

## 2018-01-25 VITALS — BP 138/70 | HR 84 | Ht 67.0 in | Wt 157.0 lb

## 2018-01-25 DIAGNOSIS — Z23 Encounter for immunization: Secondary | ICD-10-CM | POA: Diagnosis not present

## 2018-01-25 DIAGNOSIS — M7542 Impingement syndrome of left shoulder: Secondary | ICD-10-CM | POA: Diagnosis not present

## 2018-01-25 MED ORDER — MELOXICAM 15 MG PO TABS: 15.0000 mg | ORAL_TABLET | Freq: Every day | ORAL | 0 refills | Status: DC

## 2018-01-25 NOTE — Progress Notes (Signed)
Date:  01/25/2018   Name:  Cristina Morgan   DOB:  1935-02-23   MRN:  [id number]   Chief Complaint: Arm Pain (shoulder on L) side hurt/ limited ROM. started on Sat after lifting flower pots) and Flu Vaccine Arm Pain   The incident occurred 2 days ago. The incident occurred at home. Injury mechanism: lifting. The pain is present in the left shoulder. The quality of the pain is described as aching. The pain does not radiate. The pain is at a severity of 2/10. The pain is moderate. The pain has been fluctuating since the incident. Pertinent negatives include no chest pain, muscle weakness, numbness or tingling. Nothing aggravates the symptoms. She has tried acetaminophen for the symptoms. The treatment provided mild relief.     Review of Systems  Constitutional: Negative.  Negative for chills, fatigue, fever and unexpected weight change.  HENT: Negative for congestion, ear discharge, ear pain, rhinorrhea, sinus pressure, sneezing and sore throat.   Eyes: Negative for photophobia, pain, discharge, redness and itching.  Respiratory: Negative for cough, shortness of breath, wheezing and stridor.   Cardiovascular: Negative for chest pain.  Gastrointestinal: Negative for abdominal pain, blood in stool, constipation, diarrhea, nausea and vomiting.  Endocrine: Negative for cold intolerance, heat intolerance, polydipsia, polyphagia and polyuria.  Genitourinary: Negative for dysuria, flank pain, frequency, hematuria, menstrual problem, pelvic pain, urgency, vaginal bleeding and vaginal discharge.  Musculoskeletal: Negative for arthralgias, back pain and myalgias.  Skin: Negative for rash.  Allergic/Immunologic: Negative for environmental allergies and food allergies.  Neurological: Negative for dizziness, tingling, weakness, light-headedness, numbness and headaches.  Hematological: Negative for adenopathy. Does not bruise/bleed easily.  Psychiatric/Behavioral: Negative for dysphoric mood. The  patient is not nervous/anxious.     Patient Active Problem List   Diagnosis Date Noted  . Need for vaccination against Streptococcus pneumoniae using pneumococcal conjugate vaccine 13 01/07/2017  . PNA (pneumonia) 07/09/2015  . Acute chest pain 03/20/2014  . Type 2 diabetes mellitus ([place]) 03/20/2014  . Hypercholesterolemia 03/20/2014  . Essential hypertension 03/20/2014    Allergies  Allergen Reactions  . Penicillins Other (See Comments)    Past Surgical History:  Procedure Laterality Date  . CATARACT EXTRACTION Bilateral 2014  . COLONOSCOPY  2011   cleared for 5 yrs- [facility]  . ECTOPIC PREGNANCY SURGERY      Social History   Tobacco Use  . Smoking status: Never Smoker  . Smokeless tobacco: Never Used  . Tobacco comment: smoking cessation materials not required  Substance Use Topics  . Alcohol use: No    Alcohol/week: 0.0 standard drinks  . Drug use: No     Medication list has been reviewed and updated.  No outpatient medications have been marked as taking for the 01/25/18 encounter (Office Visit) with Juline Patch, MD.    [facility] 2/9 Scores 01/25/2018 09/07/2017 08/24/2017 01/07/2017  PHQ - 2 Score 0 0 0 0  PHQ- 9 Score 0 0 1 2    Physical Exam  Constitutional: She is oriented to person, place, and time. She appears well-developed and well-nourished.  HENT:  Head: Normocephalic.  Right Ear: External ear normal.  Left Ear: External ear normal.  Mouth/Throat: Oropharynx is clear and moist.  Eyes: Pupils are equal, round, and reactive to light. Conjunctivae and EOM are normal. Lids are everted and swept, no foreign bodies found. Left eye exhibits no hordeolum. No foreign body present in the left eye. Right conjunctiva is not injected. Left conjunctiva is not injected.  No scleral icterus.  Neck: Normal range of motion. Neck supple. No JVD present. No tracheal deviation present. No thyromegaly present.  Cardiovascular: Normal rate, regular rhythm, normal heart sounds  and intact distal pulses. Exam reveals no gallop and no friction rub.  No murmur heard. Pulmonary/Chest: Effort normal and breath sounds normal. No respiratory distress. She has no wheezes. She has no rales.  Abdominal: Soft. Bowel sounds are normal. She exhibits no mass. There is no hepatosplenomegaly. There is no tenderness. There is no rebound and no guarding.  Musculoskeletal: She exhibits no edema.       Left shoulder: She exhibits decreased range of motion, tenderness, effusion and pain. She exhibits no swelling and no deformity.  Lymphadenopathy:    She has no cervical adenopathy.  Neurological: She is alert and oriented to person, place, and time. She has normal strength. She displays normal reflexes. No cranial nerve deficit.  Skin: Skin is warm. No rash noted.  Psychiatric: She has a normal mood and affect. Her mood appears not anxious. She does not exhibit a depressed mood.  Nursing note and vitals reviewed.   BP 138/70   Pulse 84   Ht 5\' 7"  (1.702 m)   Wt 157 lb (71.2 kg)   BMI 24.59 kg/m   Assessment and Plan:  1. Impingement syndrome of left shoulder Acute Initiate meloxicam 15 mg daily. Next step if unresolved ortho consult.  - meloxicam (MOBIC) 15 MG tablet; Take 1 tablet (15 mg total) by mouth daily.  Dispense: 30 tablet; Refill: 0  2. Influenza vaccine needed Discussed and administered. - Flu vaccine HIGH DOSE PF   Dr. Otilio Miu [facility]  01/25/2018

## 2018-02-23 ENCOUNTER — Ambulatory Visit: Payer: Self-pay | Admitting: Family Medicine

## 2018-02-24 ENCOUNTER — Other Ambulatory Visit: Payer: Self-pay | Admitting: Family Medicine

## 2018-02-24 DIAGNOSIS — E119 Type 2 diabetes mellitus without complications: Secondary | ICD-10-CM

## 2018-03-01 ENCOUNTER — Ambulatory Visit (INDEPENDENT_AMBULATORY_CARE_PROVIDER_SITE_OTHER): Payer: Medicare Other | Admitting: Family Medicine

## 2018-03-01 ENCOUNTER — Ambulatory Visit: Payer: Self-pay | Admitting: Family Medicine

## 2018-03-01 ENCOUNTER — Ambulatory Visit
Admission: RE | Admit: 2018-03-01 | Discharge: 2018-03-01 | Disposition: A | Discharge status: Home / Self Care | Payer: Medicare Other | Attending: Family Medicine | Admitting: Family Medicine

## 2018-03-01 ENCOUNTER — Encounter: Payer: Self-pay | Admitting: Family Medicine

## 2018-03-01 ENCOUNTER — Ambulatory Visit
Admission: RE | Admit: 2018-03-01 | Discharge: 2018-03-01 | Disposition: A | Discharge status: Home / Self Care | Payer: Medicare Other | Source: Ambulatory Visit | Attending: Family Medicine | Admitting: Family Medicine

## 2018-03-01 VITALS — BP 138/82 | HR 60 | Ht 67.0 in | Wt 157.0 lb

## 2018-03-01 DIAGNOSIS — I10 Essential (primary) hypertension: Secondary | ICD-10-CM | POA: Diagnosis not present

## 2018-03-01 DIAGNOSIS — M79672 Pain in left foot: Secondary | ICD-10-CM | POA: Diagnosis not present

## 2018-03-01 DIAGNOSIS — E119 Type 2 diabetes mellitus without complications: Secondary | ICD-10-CM | POA: Diagnosis not present

## 2018-03-01 DIAGNOSIS — M7542 Impingement syndrome of left shoulder: Secondary | ICD-10-CM | POA: Diagnosis not present

## 2018-03-01 DIAGNOSIS — S99922D Unspecified injury of left foot, subsequent encounter: Secondary | ICD-10-CM

## 2018-03-01 DIAGNOSIS — R05 Cough: Secondary | ICD-10-CM

## 2018-03-01 DIAGNOSIS — T464X5A Adverse effect of angiotensin-converting-enzyme inhibitors, initial encounter: Secondary | ICD-10-CM

## 2018-03-01 DIAGNOSIS — E7800 Pure hypercholesterolemia, unspecified: Secondary | ICD-10-CM

## 2018-03-01 DIAGNOSIS — S46812D Strain of other muscles, fascia and tendons at shoulder and upper arm level, left arm, subsequent encounter: Secondary | ICD-10-CM | POA: Diagnosis not present

## 2018-03-01 IMAGING — CR DG FOOT COMPLETE 3+V*L*
3 series · 3 of 3 positions shown · non-contrast
Comparison: None.

CLINICAL DATA: 83 y/o F; blunt trauma to the foot 6-8 months ago.
Recurrent pain 2 months ago.

EXAM:
LEFT FOOT - COMPLETE 3+ VIEW

[foot ap]
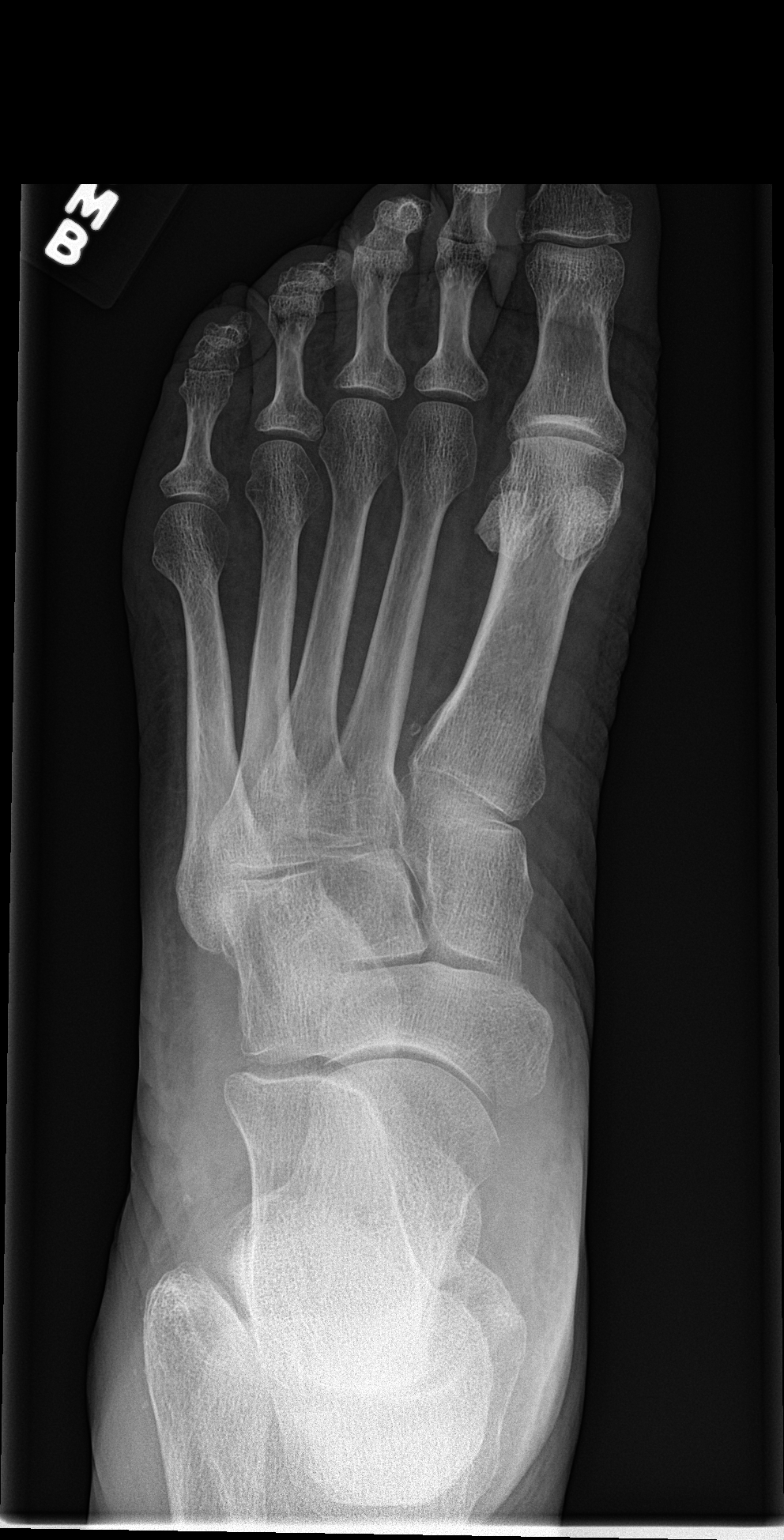

[foot obl]
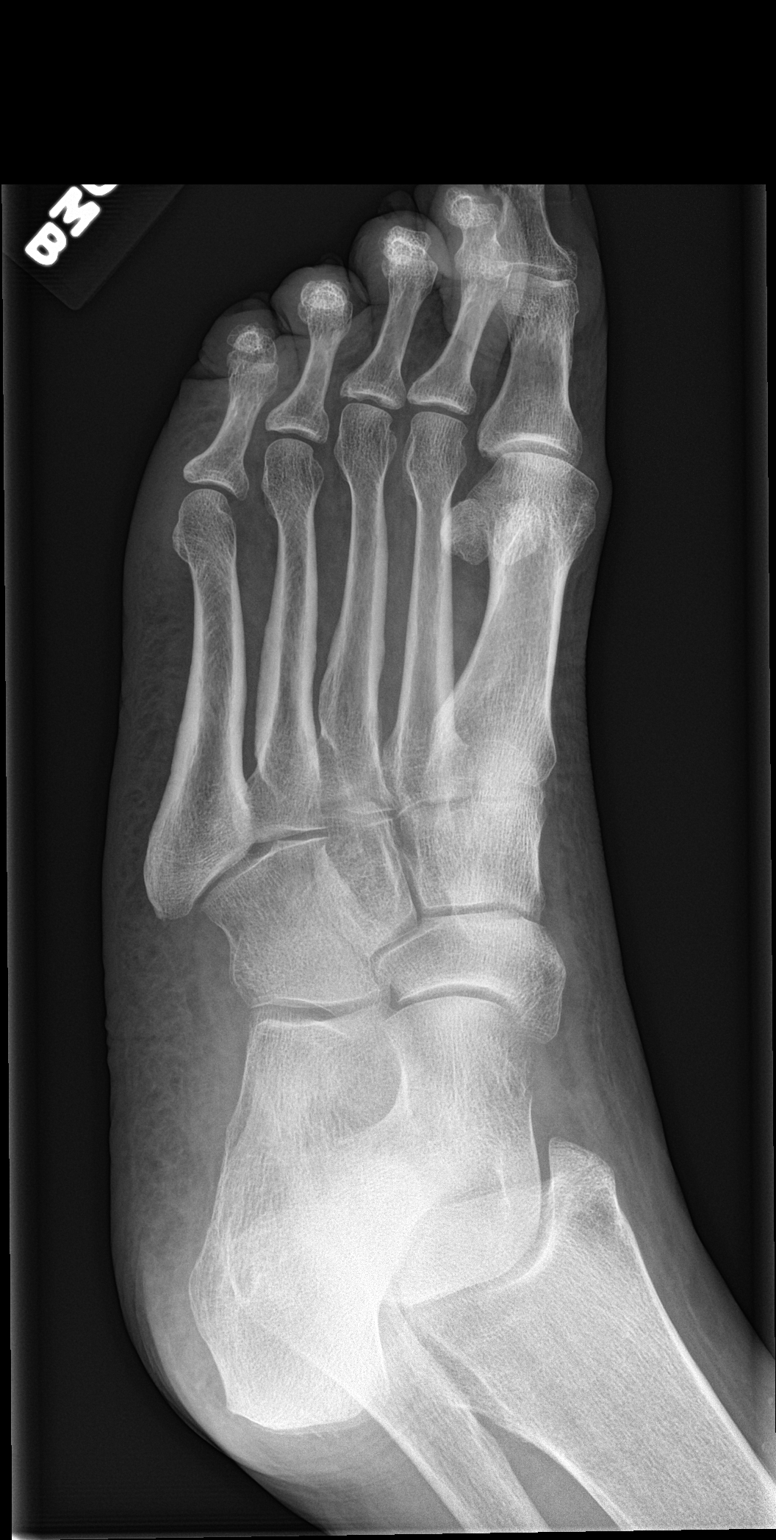

[foot lat]
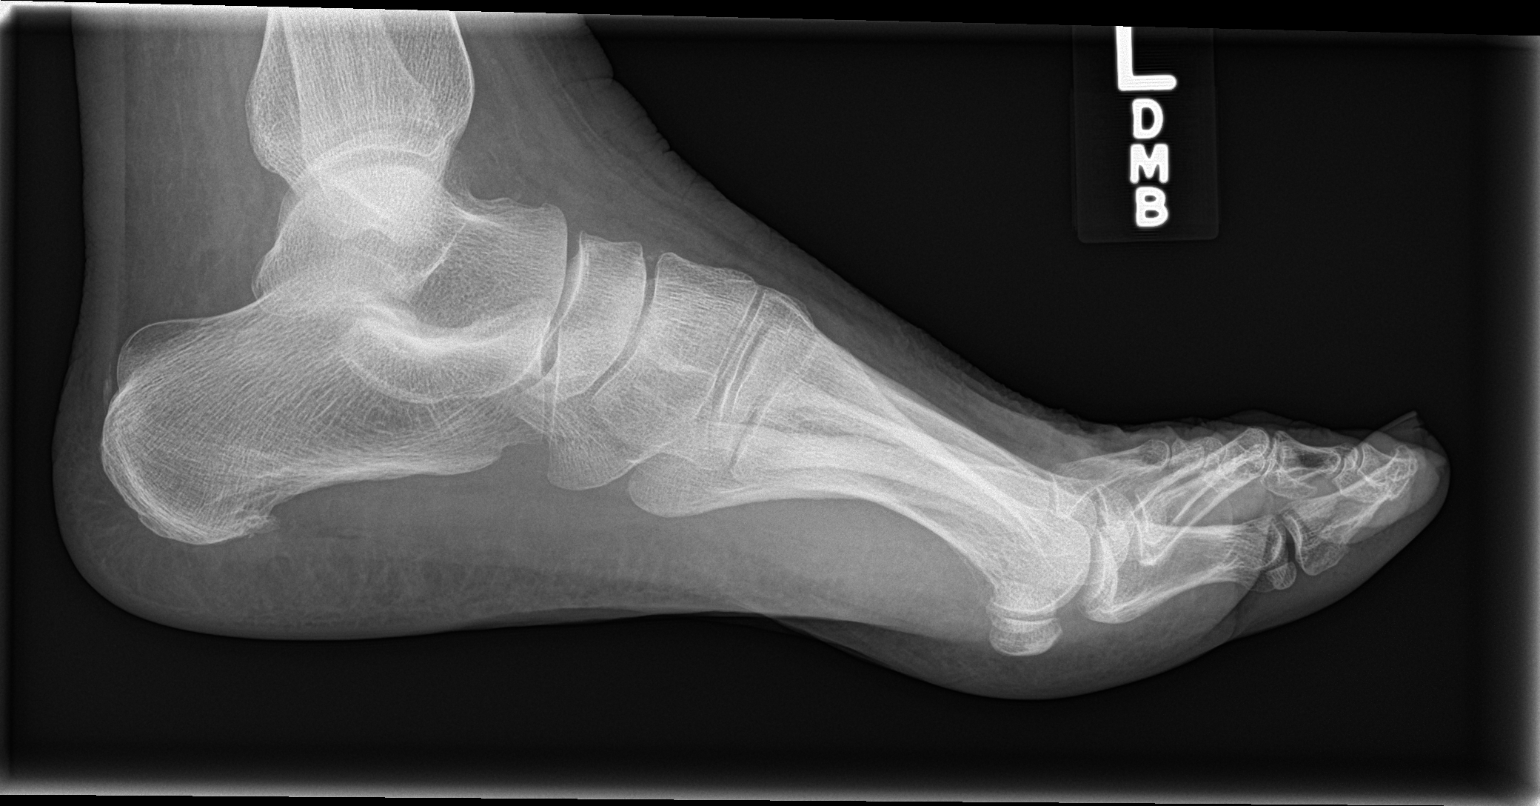

[3 of 3 positions shown; findings below may reference images not displayed]

FINDINGS: There is no evidence of fracture or dislocation. There is no
evidence of arthropathy or other focal bone abnormality. Soft
tissues are unremarkable. Small plantar calcaneal bone spur.
IMPRESSION: Negative.

## 2018-03-01 MED ORDER — GLIPIZIDE ER 5 MG PO TB24: 5.0000 mg | ORAL_TABLET | Freq: Every day | ORAL | 1 refills | Status: DC

## 2018-03-01 MED ORDER — LOSARTAN POTASSIUM 25 MG PO TABS: 25.0000 mg | ORAL_TABLET | Freq: Every day | ORAL | 1 refills | Status: DC

## 2018-03-01 MED ORDER — MELOXICAM 15 MG PO TABS: 15.0000 mg | ORAL_TABLET | Freq: Every day | ORAL | 3 refills | Status: DC

## 2018-03-01 MED ORDER — METFORMIN HCL 1000 MG PO TABS: 1000.0000 mg | ORAL_TABLET | Freq: Two times a day (BID) | ORAL | 1 refills | Status: DC

## 2018-03-01 MED ORDER — LOVASTATIN 40 MG PO TABS: 40.0000 mg | ORAL_TABLET | Freq: Every day | ORAL | 1 refills | Status: DC

## 2018-03-01 MED ORDER — FISH OIL 1000 MG PO CAPS: 1.0000 | ORAL_CAPSULE | Freq: Every day | ORAL | 3 refills | Status: DC

## 2018-03-01 NOTE — Progress Notes (Signed)
Date:  03/01/2018   Name:  Cristina Morgan   DOB:  10-08-1934   MRN:  [id number]   Chief Complaint: Diabetes (needs foot exam); Hypertension; Hyperlipidemia; Shoulder Pain (has tried meloxicam- wants to go to ortho for poss injection); and pneum vacc (pneum 23 needed)  Diabetes  She presents for her follow-up diabetic visit. She has type 2 diabetes mellitus. Her disease course has been stable. There are no hypoglycemic associated symptoms. Pertinent negatives for hypoglycemia include no dizziness, headaches, nervousness/anxiousness or sweats. Pertinent negatives for diabetes include no blurred vision, no chest pain, no fatigue, no foot paresthesias, no foot ulcerations, no polydipsia, no polyphagia, no polyuria, no visual change, no weakness and no weight loss. There are no hypoglycemic complications. Symptoms are stable. There are no diabetic complications. Pertinent negatives for diabetic complications include no CVA, PVD or retinopathy. There are no known risk factors for coronary artery disease. Current diabetic treatment includes oral agent (dual therapy). She is compliant with treatment all of the time. She is following a generally healthy diet. Meal planning includes avoidance of concentrated sweets. Her breakfast blood glucose is taken between 8-9 am. Her lunch blood glucose range is generally 110-130 mg/dl. An ACE inhibitor/angiotensin II receptor blocker is being taken. She does not see a podiatrist.Eye exam is current.  Hypertension  This is a chronic problem. The current episode started more than 1 year ago. The problem has been gradually improving since onset. The problem is controlled. Pertinent negatives include no anxiety, blurred vision, chest pain, headaches, malaise/fatigue, neck pain, orthopnea, palpitations, peripheral edema, PND, shortness of breath or sweats. There are no associated agents to hypertension. Risk factors for coronary artery disease include diabetes mellitus and  dyslipidemia. Past treatments include angiotensin blockers. There are no compliance problems.  There is no history of angina, kidney disease, CAD/MI, CVA, heart failure, left ventricular hypertrophy, PVD or retinopathy. There is no history of chronic renal disease, a hypertension causing med or renovascular disease.  Hyperlipidemia  This is a chronic problem. The current episode started more than 1 year ago. The problem is controlled. Recent lipid tests were reviewed and are normal. She has no history of chronic renal disease, diabetes, hypothyroidism, liver disease or obesity. Pertinent negatives include no chest pain, myalgias or shortness of breath. The current treatment provides moderate improvement of lipids. There are no compliance problems.  Risk factors for coronary artery disease include diabetes mellitus, dyslipidemia, hypertension and female sex.  Shoulder Pain   The pain is present in the left shoulder. This is a recurrent problem. The current episode started more than 1 year ago. The problem has been waxing and waning. The pain is moderate. Associated symptoms include a limited range of motion. Pertinent negatives include no fever, inability to bear weight or numbness. The treatment provided mild relief. There is no history of diabetes.  URI   This is a new problem. The current episode started in the past 7 days. The problem has been gradually improving. There has been no fever. Associated symptoms include congestion, a plugged ear sensation and rhinorrhea. Pertinent negatives include no abdominal pain, chest pain, coughing, diarrhea, dysuria, ear pain, headaches, nausea, neck pain, rash, sinus pain, sneezing, sore throat, vomiting or wheezing. She has tried antihistamine (corcedin) for the symptoms. The treatment provided mild relief.  Foot Injury   Incident onset: 1 year ago. The incident occurred at home. The injury mechanism was a direct blow (dropped a tray). The pain is present in the left  foot. The quality of the pain is described as aching. The pain is mild. The pain has been fluctuating since onset. Pertinent negatives include no inability to bear weight, loss of sensation or numbness.    Review of Systems  Constitutional: Negative.  Negative for chills, fatigue, fever, malaise/fatigue, unexpected weight change and weight loss.  HENT: Positive for congestion and rhinorrhea. Negative for ear discharge, ear pain, sinus pressure, sinus pain, sneezing and sore throat.   Eyes: Negative for blurred vision, photophobia, pain, discharge, redness and itching.  Respiratory: Negative for cough, shortness of breath, wheezing and stridor.   Cardiovascular: Negative for chest pain, palpitations, orthopnea and PND.  Gastrointestinal: Negative for abdominal pain, blood in stool, constipation, diarrhea, nausea and vomiting.  Endocrine: Negative for cold intolerance, heat intolerance, polydipsia, polyphagia and polyuria.  Genitourinary: Negative for dysuria, flank pain, frequency, hematuria, menstrual problem, pelvic pain, urgency, vaginal bleeding and vaginal discharge.  Musculoskeletal: Negative for arthralgias, back pain, myalgias and neck pain.  Skin: Negative for rash.  Allergic/Immunologic: Negative for environmental allergies and food allergies.  Neurological: Negative for dizziness, weakness, light-headedness, numbness and headaches.  Hematological: Negative for adenopathy. Does not bruise/bleed easily.  Psychiatric/Behavioral: Negative for dysphoric mood. The patient is not nervous/anxious.     Patient Active Problem List   Diagnosis Date Noted  . Need for vaccination against Streptococcus pneumoniae using pneumococcal conjugate vaccine 13 01/07/2017  . PNA (pneumonia) 07/09/2015  . Acute chest pain 03/20/2014  . Type 2 diabetes mellitus ([place]) 03/20/2014  . Hypercholesterolemia 03/20/2014  . Essential hypertension 03/20/2014    Allergies  Allergen Reactions  . Penicillins  Other (See Comments)    Past Surgical History:  Procedure Laterality Date  . CATARACT EXTRACTION Bilateral 2014  . COLONOSCOPY  2011   cleared for 5 yrs- [facility]  . ECTOPIC PREGNANCY SURGERY      Social History   Tobacco Use  . Smoking status: Never Smoker  . Smokeless tobacco: Never Used  . Tobacco comment: smoking cessation materials not required  Substance Use Topics  . Alcohol use: No    Alcohol/week: 0.0 standard drinks  . Drug use: No     Medication list has been reviewed and updated.  Current Meds  Medication Sig  . aspirin 81 MG chewable tablet Chew 1 tablet (81 mg total) by mouth daily.  Marland Kitchen glipiZIDE (GLUCOTROL XL) 5 MG 24 hr tablet TAKE 1 TABLET BY MOUTH ONCE DAILY  . losartan (COZAAR) 25 MG tablet Take 1 tablet (25 mg total) by mouth daily.  Marland Kitchen lovastatin (MEVACOR) 40 MG tablet Take 1 tablet (40 mg total) by mouth daily.  . meloxicam (MOBIC) 15 MG tablet Take 1 tablet (15 mg total) by mouth daily.  . metFORMIN (GLUCOPHAGE) 1000 MG tablet Take 1 tablet (1,000 mg total) by mouth 2 (two) times daily.  . Omega-3 Fatty Acids (FISH OIL) 1000 MG CAPS Take 1 capsule (1,000 mg total) by mouth daily.    PHQ 2/9 Scores 01/25/2018 09/07/2017 08/24/2017 01/07/2017  PHQ - 2 Score 0 0 0 0  PHQ- 9 Score 0 0 1 2    Physical Exam Vitals signs and nursing note reviewed.  Constitutional:      General: She is not in acute distress.    Appearance: She is not diaphoretic.  HENT:     Head: Normocephalic and atraumatic.     Right Ear: Hearing, tympanic membrane, ear canal and external ear normal.     Left Ear: Hearing, tympanic membrane, ear canal and  external ear normal.     Nose: Nose normal.     Right Nostril: No epistaxis.     Right Sinus: No maxillary sinus tenderness or frontal sinus tenderness.     Left Sinus: No maxillary sinus tenderness or frontal sinus tenderness.     Mouth/Throat:     Palate: No mass.  Eyes:     General:        Right eye: No discharge.        Left  eye: No discharge.     Conjunctiva/sclera: Conjunctivae normal.     Pupils: Pupils are equal, round, and reactive to light.  Neck:     Musculoskeletal: Normal range of motion and neck supple.     Thyroid: No thyromegaly.     Vascular: No carotid bruit or JVD.  Cardiovascular:     Rate and Rhythm: Normal rate and regular rhythm.     Pulses:          Dorsalis pedis pulses are 2+ on the right side and 2+ on the left side.       Posterior tibial pulses are 2+ on the right side and 2+ on the left side.     Heart sounds: S1 normal and S2 normal. Murmur present. Systolic murmur present with a grade of 1/6. No friction rub. No gallop. No S3 or S4 sounds.   Pulmonary:     Effort: Pulmonary effort is normal.     Breath sounds: Normal breath sounds.  Abdominal:     General: Bowel sounds are normal.     Palpations: Abdomen is soft. There is no mass.     Tenderness: There is no abdominal tenderness. There is no guarding.  Musculoskeletal:     Left shoulder: She exhibits decreased range of motion and tenderness.     Right lower leg: No edema.     Left lower leg: No edema.     Right foot: Normal range of motion. No deformity, bunion or foot drop.     Left foot: Normal range of motion. No deformity, bunion or foot drop.  Feet:     Right foot:     Protective Sensation: 10 sites tested. 10 sites sensed.     Skin integrity: Skin integrity normal. No ulcer, blister, skin breakdown, erythema, warmth, callus or dry skin.     Left foot:     Protective Sensation: 10 sites tested. 10 sites sensed.     Skin integrity: Skin integrity normal. No ulcer, blister, skin breakdown, erythema, warmth, callus or dry skin.  Lymphadenopathy:     Cervical: No cervical adenopathy.  Skin:    General: Skin is warm and dry.  Neurological:     Mental Status: She is alert.     Deep Tendon Reflexes: Reflexes are normal and symmetric.     BP 138/82   Pulse 60   Ht 5\' 7"  (1.702 m)   Wt 157 lb (71.2 kg)   BMI 24.59  kg/m   Assessment and Plan: 1. Type 2 diabetes mellitus without complication, without long-term current use of insulin (HCC) Chronic, controlled. Refill losartan, metformin, and glipizide/ draw A1C - losartan (COZAAR) 25 MG tablet; Take 1 tablet (25 mg total) by mouth daily.  Dispense: 90 tablet; Refill: 1 - metFORMIN (GLUCOPHAGE) 1000 MG tablet; Take 1 tablet (1,000 mg total) by mouth 2 (two) times daily.  Dispense: 180 tablet; Refill: 1 - glipiZIDE (GLUCOTROL XL) 5 MG 24 hr tablet; Take 1 tablet (5 mg total) by mouth  daily.  Dispense: 90 tablet; Refill: 1 - Hemoglobin A1c  2. Essential hypertension Chronic, controlled- refill losartan/ draw A1C and renal panel - losartan (COZAAR) 25 MG tablet; Take 1 tablet (25 mg total) by mouth daily.  Dispense: 90 tablet; Refill: 1 - Hemoglobin A1c - Renal Function Panel  3. Cough due to ACE inhibitor Discussed allergy to med/ will continue to monitor - losartan (COZAAR) 25 MG tablet; Take 1 tablet (25 mg total) by mouth daily.  Dispense: 90 tablet; Refill: 1  4. Hypercholesterolemia Chronic, controlled- refill lovastatin, cont omega 3 otc/ draw lipid panel - lovastatin (MEVACOR) 40 MG tablet; Take 1 tablet (40 mg total) by mouth daily.  Dispense: 90 tablet; Refill: 1 - Omega-3 Fatty Acids (FISH OIL) 1000 MG CAPS; Take 1 capsule (1,000 mg total) by mouth daily.  Dispense: 100 capsule; Refill: 3 - metFORMIN (GLUCOPHAGE) 1000 MG tablet; Take 1 tablet (1,000 mg total) by mouth 2 (two) times daily.  Dispense: 180 tablet; Refill: 1 - Lipid panel  5. Impingement syndrome of left shoulder Refill meloxicam and refer to ortho for further eval and possible injection - meloxicam (MOBIC) 15 MG tablet; Take 1 tablet (15 mg total) by mouth daily.  Dispense: 30 tablet; Refill: 3  6. Type 2 diabetes mellitus without complication, without long-term current use of insulin (HCC) Chronic, controlled- refill meds/ draw A1C - losartan (COZAAR) 25 MG tablet; Take 1  tablet (25 mg total) by mouth daily.  Dispense: 90 tablet; Refill: 1 - metFORMIN (GLUCOPHAGE) 1000 MG tablet; Take 1 tablet (1,000 mg total) by mouth 2 (two) times daily.  Dispense: 180 tablet; Refill: 1 - glipiZIDE (GLUCOTROL XL) 5 MG 24 hr tablet; Take 1 tablet (5 mg total) by mouth daily.  Dispense: 90 tablet; Refill: 1 - Hemoglobin A1c  7. Strain of supraspinatus muscle or tendon, left, subsequent encounter Referral to ortho for further eval and treatment/ possible injection - Ambulatory referral to Orthopedic Surgery  8. Foot injury, left, subsequent encounter Xray of foot - DG Foot Complete Left; Future

## 2018-03-02 LAB — LIPID PANEL
Chol/HDL Ratio: 2.7 ratio (ref 0.0–4.4)
Cholesterol, Total: 164 mg/dL (ref 100–199)
HDL: 61 mg/dL (ref >39)
LDL Calculated: 90 mg/dL (ref 0–99)
Triglycerides: 67 mg/dL (ref 0–149)
VLDL Cholesterol Cal: 13 mg/dL (ref 5–40)

## 2018-03-02 LAB — RENAL FUNCTION PANEL
ALBUMIN: 4.4 g/dL (ref 3.5–4.7)
BUN/Creatinine Ratio: 14 (ref 12–28)
BUN: 10 mg/dL (ref 8–27)
CHLORIDE: 101 mmol/L (ref 96–106)
CO2: 23 mmol/L (ref 20–29)
Calcium: 9.5 mg/dL (ref 8.7–10.3)
Creatinine, Ser: 0.71 mg/dL (ref 0.57–1.00)
GFR calc Af Amer: 92 mL/min/{1.73_m2} (ref >59)
GFR calc non Af Amer: 80 mL/min/{1.73_m2} (ref >59)
Glucose: 147 mg/dL — ABNORMAL HIGH (ref 65–99)
Phosphorus: 2.8 mg/dL (ref 2.5–4.5)
Potassium: 3.7 mmol/L (ref 3.5–5.2)
Sodium: 138 mmol/L (ref 134–144)

## 2018-03-02 LAB — HEMOGLOBIN A1C
Est. average glucose Bld gHb Est-mCnc: 163 mg/dL
Hgb A1c MFr Bld: 7.3 % — ABNORMAL HIGH (ref 4.8–5.6)

## 2018-03-23 DIAGNOSIS — M25512 Pain in left shoulder: Secondary | ICD-10-CM | POA: Diagnosis not present

## 2018-07-01 ENCOUNTER — Ambulatory Visit: Payer: Self-pay | Admitting: Family Medicine

## 2018-07-06 ENCOUNTER — Other Ambulatory Visit: Payer: Self-pay

## 2018-07-06 ENCOUNTER — Encounter: Payer: Self-pay | Admitting: Family Medicine

## 2018-07-06 ENCOUNTER — Ambulatory Visit (INDEPENDENT_AMBULATORY_CARE_PROVIDER_SITE_OTHER): Payer: Medicare Other | Admitting: Family Medicine

## 2018-07-06 VITALS — BP 130/64 | HR 72 | Ht 67.0 in | Wt 152.0 lb

## 2018-07-06 DIAGNOSIS — F5102 Adjustment insomnia: Secondary | ICD-10-CM | POA: Diagnosis not present

## 2018-07-06 DIAGNOSIS — E7800 Pure hypercholesterolemia, unspecified: Secondary | ICD-10-CM | POA: Diagnosis not present

## 2018-07-06 DIAGNOSIS — E119 Type 2 diabetes mellitus without complications: Secondary | ICD-10-CM | POA: Diagnosis not present

## 2018-07-06 DIAGNOSIS — I10 Essential (primary) hypertension: Secondary | ICD-10-CM | POA: Diagnosis not present

## 2018-07-06 DIAGNOSIS — R69 Illness, unspecified: Secondary | ICD-10-CM

## 2018-07-06 MED ORDER — LOSARTAN POTASSIUM 25 MG PO TABS: 25.0000 mg | ORAL_TABLET | Freq: Every day | ORAL | 1 refills | Status: DC

## 2018-07-06 MED ORDER — MELOXICAM 15 MG PO TABS: 15.0000 mg | ORAL_TABLET | Freq: Every day | ORAL | 3 refills | Status: DC

## 2018-07-06 MED ORDER — HYDROXYZINE HCL 10 MG PO TABS: 10.0000 mg | ORAL_TABLET | Freq: Every evening | ORAL | 5 refills | Status: DC | PRN

## 2018-07-06 MED ORDER — GLIPIZIDE ER 5 MG PO TB24: 5.0000 mg | ORAL_TABLET | Freq: Every day | ORAL | 1 refills | Status: DC

## 2018-07-06 MED ORDER — METFORMIN HCL 1000 MG PO TABS: 1000.0000 mg | ORAL_TABLET | Freq: Two times a day (BID) | ORAL | 1 refills | Status: DC

## 2018-07-06 MED ORDER — FISH OIL 1000 MG PO CAPS: 1.0000 | ORAL_CAPSULE | Freq: Every day | ORAL | 3 refills | Status: DC

## 2018-07-06 MED ORDER — ASPIRIN 81 MG PO CHEW: 81.0000 mg | CHEWABLE_TABLET | Freq: Every day | ORAL | 3 refills | Status: DC

## 2018-07-06 MED ORDER — LOVASTATIN 40 MG PO TABS: 40.0000 mg | ORAL_TABLET | Freq: Every day | ORAL | 1 refills | Status: DC

## 2018-07-06 NOTE — Progress Notes (Signed)
Date:  07/06/2018   Name:  Cristina Morgan   DOB:  Aug 09, 1934   MRN:  [id number]   Chief Complaint: Diabetes; Hypertension; Hyperlipidemia; and Depression (PHQ9=6 and GAD7=2/ brother died unexpectedly and nephew hung himself)  Diabetes  She presents for her follow-up diabetic visit. She has type 2 diabetes mellitus. Her disease course has been stable. There are no hypoglycemic associated symptoms. Pertinent negatives for hypoglycemia include no dizziness, headaches, nervousness/anxiousness or sweats. There are no diabetic associated symptoms. Pertinent negatives for diabetes include no blurred vision, no chest pain, no fatigue, no foot paresthesias, no foot ulcerations, no polydipsia, no polyphagia, no polyuria, no visual change, no weakness and no weight loss. There are no hypoglycemic complications. Symptoms are stable. There are no diabetic complications. Pertinent negatives for diabetic complications include no autonomic neuropathy, CVA, heart disease, impotence, nephropathy, peripheral neuropathy, PVD or retinopathy. There are no known risk factors for coronary artery disease. Current diabetic treatment includes oral agent (dual therapy). She is compliant with treatment all of the time. She is following a generally healthy diet. She participates in exercise intermittently. Her home blood glucose trend is fluctuating minimally. Her breakfast blood glucose is taken between 8-9 am. Her breakfast blood glucose range is generally 110-130 mg/dl. An ACE inhibitor/angiotensin II receptor blocker is being taken. Eye exam is current.  Hypertension  This is a chronic problem. The current episode started more than 1 year ago. The problem has been waxing and waning since onset. The problem is controlled. Pertinent negatives include no anxiety, blurred vision, chest pain, headaches, malaise/fatigue, neck pain, orthopnea, palpitations, peripheral edema, PND, shortness of breath or sweats. There are no associated  agents to hypertension. Risk factors for coronary artery disease include stress, post-menopausal state, diabetes mellitus and dyslipidemia. Past treatments include angiotensin blockers. There is no history of CVA, PVD or retinopathy. There is no history of chronic renal disease, a hypertension causing med or renovascular disease.  Hyperlipidemia  This is a chronic problem. The current episode started more than 1 year ago. The problem is controlled. Recent lipid tests were reviewed and are normal. She has no history of chronic renal disease, diabetes, hypothyroidism, liver disease, obesity or nephrotic syndrome. Pertinent negatives include no chest pain, focal sensory loss, focal weakness, leg pain, myalgias or shortness of breath. Current antihyperlipidemic treatment includes statins. The current treatment provides moderate improvement of lipids. There are no compliance problems.  Risk factors for coronary artery disease include diabetes mellitus, hypertension, post-menopausal and dyslipidemia.  Insomnia  Primary symptoms: fragmented sleep, no difficulty falling asleep, no malaise/fatigue.  The onset quality is gradual. The problem occurs nightly. PMH includes: depression.    Review of Systems  Constitutional: Negative.  Negative for chills, fatigue, fever, malaise/fatigue, unexpected weight change and weight loss.  HENT: Negative for congestion, ear discharge, ear pain, rhinorrhea, sinus pressure, sneezing and sore throat.   Eyes: Negative for blurred vision, photophobia, pain, discharge, redness and itching.  Respiratory: Negative for cough, shortness of breath, wheezing and stridor.   Cardiovascular: Negative for chest pain, palpitations, orthopnea and PND.  Gastrointestinal: Negative for abdominal pain, blood in stool, constipation, diarrhea, nausea and vomiting.  Endocrine: Negative for cold intolerance, heat intolerance, polydipsia, polyphagia and polyuria.  Genitourinary: Negative for dysuria,  flank pain, frequency, hematuria, impotence, menstrual problem, pelvic pain, urgency, vaginal bleeding and vaginal discharge.  Musculoskeletal: Negative for arthralgias, back pain, myalgias and neck pain.  Skin: Negative for rash.  Allergic/Immunologic: Negative for environmental allergies and food allergies.  Neurological: Negative for dizziness, focal weakness, weakness, light-headedness, numbness and headaches.  Hematological: Negative for adenopathy. Does not bruise/bleed easily.  Psychiatric/Behavioral: Positive for depression. Negative for dysphoric mood. The patient has insomnia. The patient is not nervous/anxious.     Patient Active Problem List   Diagnosis Date Noted  . Need for vaccination against Streptococcus pneumoniae using pneumococcal conjugate vaccine 13 01/07/2017  . PNA (pneumonia) 07/09/2015  . Acute chest pain 03/20/2014  . Type 2 diabetes mellitus ([place]) 03/20/2014  . Hypercholesterolemia 03/20/2014  . Essential hypertension 03/20/2014    Allergies  Allergen Reactions  . Penicillins Other (See Comments)    Past Surgical History:  Procedure Laterality Date  . CATARACT EXTRACTION Bilateral 2014  . COLONOSCOPY  2011   cleared for 5 yrs- [facility]  . ECTOPIC PREGNANCY SURGERY      Social History   Tobacco Use  . Smoking status: Never Smoker  . Smokeless tobacco: Never Used  . Tobacco comment: smoking cessation materials not required  Substance Use Topics  . Alcohol use: No    Alcohol/week: 0.0 standard drinks  . Drug use: No     Medication list has been reviewed and updated.  Current Meds  Medication Sig  . aspirin 81 MG chewable tablet Chew 1 tablet (81 mg total) by mouth daily.  Marland Kitchen glipiZIDE (GLUCOTROL XL) 5 MG 24 hr tablet Take 1 tablet (5 mg total) by mouth daily.  Marland Kitchen losartan (COZAAR) 25 MG tablet Take 1 tablet (25 mg total) by mouth daily.  Marland Kitchen lovastatin (MEVACOR) 40 MG tablet Take 1 tablet (40 mg total) by mouth daily.  . metFORMIN (GLUCOPHAGE)  1000 MG tablet Take 1 tablet (1,000 mg total) by mouth 2 (two) times daily.  . Omega-3 Fatty Acids (FISH OIL) 1000 MG CAPS Take 1 capsule (1,000 mg total) by mouth daily.    PHQ 2/9 Scores 07/06/2018 01/25/2018 09/07/2017 08/24/2017  PHQ - 2 Score 1 0 0 0  PHQ- 9 Score 6 0 0 1    BP Readings from Last 3 Encounters:  07/06/18 130/64  03/01/18 138/82  01/25/18 138/70    Physical Exam Vitals signs and nursing note reviewed.  Constitutional:      General: She is not in acute distress.    Appearance: She is not diaphoretic.  HENT:     Head: Normocephalic and atraumatic.     Right Ear: Tympanic membrane, ear canal and external ear normal.     Left Ear: Tympanic membrane, ear canal and external ear normal.     Nose: Nose normal.  Eyes:     General:        Right eye: No discharge.        Left eye: No discharge.     Conjunctiva/sclera: Conjunctivae normal.     Pupils: Pupils are equal, round, and reactive to light.  Neck:     Musculoskeletal: Normal range of motion and neck supple. No neck rigidity or muscular tenderness.     Thyroid: No thyromegaly.     Vascular: No carotid bruit or JVD.  Cardiovascular:     Rate and Rhythm: Normal rate and regular rhythm.     Pulses:          Dorsalis pedis pulses are 2+ on the right side and 2+ on the left side.       Posterior tibial pulses are 2+ on the right side and 2+ on the left side.     Heart sounds: Normal heart sounds. No murmur. No  friction rub. No gallop.   Pulmonary:     Effort: Pulmonary effort is normal.     Breath sounds: Normal breath sounds.  Abdominal:     General: Bowel sounds are normal.     Palpations: Abdomen is soft. There is no mass.     Tenderness: There is no abdominal tenderness. There is no guarding.  Musculoskeletal: Normal range of motion.  Feet:     Right foot:     Protective Sensation: 10 sites tested. 10 sites sensed.     Skin integrity: Dry skin present. No ulcer, blister, skin breakdown, erythema, warmth,  callus or fissure.     Toenail Condition: Right toenails are normal.     Left foot:     Protective Sensation: 10 sites tested. 10 sites sensed.     Skin integrity: Dry skin present. No ulcer, blister, skin breakdown, erythema, warmth, callus or fissure.     Toenail Condition: Left toenails are normal.  Lymphadenopathy:     Cervical: No cervical adenopathy.  Skin:    General: Skin is warm and dry.  Neurological:     Mental Status: She is alert.     Deep Tendon Reflexes: Reflexes are normal and symmetric.     Wt Readings from Last 3 Encounters:  07/06/18 152 lb (68.9 kg)  03/01/18 157 lb (71.2 kg)  01/25/18 157 lb (71.2 kg)    BP 130/64   Pulse 72   Ht 5\' 7"  (1.702 m)   Wt 152 lb (68.9 kg)   BMI 23.81 kg/m   Assessment and Plan:   1. Type 2 diabetes mellitus without complication, without long-term current use of insulin (HCC) Chronic.  Controlled.  Continue metformin 1 g twice a day and glipizide XL 5 mg once a day.  Will check A1c and microalbuminuria.  Foot exam was noted to be unremarkable. - Microalbumin, urine - HgB A1c - losartan (COZAAR) 25 MG tablet; Take 1 tablet (25 mg total) by mouth daily.  Dispense: 90 tablet; Refill: 1 - metFORMIN (GLUCOPHAGE) 1000 MG tablet; Take 1 tablet (1,000 mg total) by mouth 2 (two) times daily.  Dispense: 180 tablet; Refill: 1 - aspirin 81 MG chewable tablet; Chew 1 tablet (81 mg total) by mouth daily.  Dispense: 100 tablet; Refill: 3 - glipiZIDE (GLUCOTROL XL) 5 MG 24 hr tablet; Take 1 tablet (5 mg total) by mouth daily.  Dispense: 90 tablet; Refill: 1  2. Essential hypertension Chronic.  Controlled.  Continue losartan 25 mg once a day. - losartan (COZAAR) 25 MG tablet; Take 1 tablet (25 mg total) by mouth daily.  Dispense: 90 tablet; Refill: 1  3. Hypercholesterolemia Chronic.  Controlled.  Continue lovastatin 40 mg once a day as well as over-the-counter omega-3.  Repeat panel was reviewed. - lovastatin (MEVACOR) 40 MG tablet; Take  1 tablet (40 mg total) by mouth daily.  Dispense: 90 tablet; Refill: 1 - Omega-3 Fatty Acids (FISH OIL) 1000 MG CAPS; Take 1 capsule (1,000 mg total) by mouth daily.  Dispense: 100 capsule; Refill: 3 - metFORMIN (GLUCOPHAGE) 1000 MG tablet; Take 1 tablet (1,000 mg total) by mouth 2 (two) times daily.  Dispense: 180 tablet; Refill: 1  4. Taking medication for chronic disease Patient will have hepatic function panel to evaluate for hepatotoxicity while on statin. - Hepatic function panel  5. Adjustment insomnia Patient has recent death in the family for which she is having difficulty maintaining sleep will initiate hydroxyzine 10 mg nightly. - hydrOXYzine (ATARAX/VISTARIL) 10 MG tablet;  Take 1 tablet (10 mg total) by mouth at bedtime as needed.  Dispense: 30 tablet; Refill: 5

## 2018-07-07 LAB — HEPATIC FUNCTION PANEL
ALT: 9 IU/L (ref 0–32)
AST: 19 IU/L (ref 0–40)
Albumin: 4.3 g/dL (ref 3.6–4.6)
Alkaline Phosphatase: 56 IU/L (ref 39–117)
Bilirubin Total: 0.4 mg/dL (ref 0.0–1.2)
Bilirubin, Direct: 0.14 mg/dL (ref 0.00–0.40)
Total Protein: 7.1 g/dL (ref 6.0–8.5)

## 2018-07-07 LAB — HEMOGLOBIN A1C
Est. average glucose Bld gHb Est-mCnc: 154 mg/dL
Hgb A1c MFr Bld: 7 % — ABNORMAL HIGH (ref 4.8–5.6)

## 2018-07-07 LAB — MICROALBUMIN, URINE: Microalbumin, Urine: 4.6 ug/mL

## 2018-09-28 DIAGNOSIS — E113393 Type 2 diabetes mellitus with moderate nonproliferative diabetic retinopathy without macular edema, bilateral: Secondary | ICD-10-CM | POA: Diagnosis not present

## 2018-11-05 ENCOUNTER — Ambulatory Visit: Payer: Self-pay | Admitting: Family Medicine

## 2019-01-25 ENCOUNTER — Ambulatory Visit (INDEPENDENT_AMBULATORY_CARE_PROVIDER_SITE_OTHER): Payer: Medicare Other | Admitting: Family Medicine

## 2019-01-25 ENCOUNTER — Encounter: Payer: Self-pay | Admitting: Family Medicine

## 2019-01-25 ENCOUNTER — Other Ambulatory Visit: Payer: Self-pay

## 2019-01-25 VITALS — BP 130/62 | HR 68 | Ht 67.0 in | Wt 147.0 lb

## 2019-01-25 DIAGNOSIS — Z23 Encounter for immunization: Secondary | ICD-10-CM | POA: Diagnosis not present

## 2019-01-25 DIAGNOSIS — F329 Major depressive disorder, single episode, unspecified: Secondary | ICD-10-CM

## 2019-01-25 DIAGNOSIS — E119 Type 2 diabetes mellitus without complications: Secondary | ICD-10-CM | POA: Diagnosis not present

## 2019-01-25 DIAGNOSIS — I10 Essential (primary) hypertension: Secondary | ICD-10-CM

## 2019-01-25 DIAGNOSIS — F5102 Adjustment insomnia: Secondary | ICD-10-CM

## 2019-01-25 DIAGNOSIS — E7800 Pure hypercholesterolemia, unspecified: Secondary | ICD-10-CM | POA: Diagnosis not present

## 2019-01-25 MED ORDER — HYDROXYZINE HCL 10 MG PO TABS: 10.0000 mg | ORAL_TABLET | Freq: Every evening | ORAL | 5 refills | Status: DC | PRN

## 2019-01-25 MED ORDER — GLIPIZIDE ER 5 MG PO TB24: 5.0000 mg | ORAL_TABLET | Freq: Every day | ORAL | 1 refills | Status: DC

## 2019-01-25 MED ORDER — METFORMIN HCL 1000 MG PO TABS: 1000.0000 mg | ORAL_TABLET | Freq: Two times a day (BID) | ORAL | 1 refills | Status: DC

## 2019-01-25 MED ORDER — FISH OIL 1000 MG PO CAPS: 1.0000 | ORAL_CAPSULE | Freq: Every day | ORAL | 3 refills | Status: DC

## 2019-01-25 MED ORDER — LOVASTATIN 40 MG PO TABS: 40.0000 mg | ORAL_TABLET | Freq: Every day | ORAL | 1 refills | Status: DC

## 2019-01-25 MED ORDER — LOSARTAN POTASSIUM 25 MG PO TABS: 25.0000 mg | ORAL_TABLET | Freq: Every day | ORAL | 1 refills | Status: DC

## 2019-01-25 MED ORDER — SERTRALINE HCL 25 MG PO TABS: 25.0000 mg | ORAL_TABLET | Freq: Every day | ORAL | 1 refills | Status: DC

## 2019-01-25 NOTE — Progress Notes (Signed)
Date:  01/25/2019   Name:  Cristina Morgan   DOB:  March 12, 1935   MRN:  [id number]   Chief Complaint: Diabetes, Depression (PHQ9=2 and GAD7=1), Hypertension, Hyperlipidemia, Flu Vaccine, and pneum vacc need (23)  Diabetes She presents for her follow-up diabetic visit. She has type 2 diabetes mellitus. Her disease course has been stable. There are no hypoglycemic associated symptoms. Pertinent negatives for hypoglycemia include no dizziness, headaches, nervousness/anxiousness or sweats. Pertinent negatives for diabetes include no blurred vision, no chest pain, no fatigue, no foot paresthesias, no foot ulcerations, no polydipsia, no polyphagia, no polyuria, no visual change, no weakness and no weight loss. There are no hypoglycemic complications. Symptoms are stable. There are no diabetic complications. Pertinent negatives for diabetic complications include no autonomic neuropathy, CVA, heart disease, impotence, nephropathy, peripheral neuropathy, PVD or retinopathy. Current diabetic treatment includes oral agent (dual therapy). Her breakfast blood glucose is taken between 8-9 am. Her breakfast blood glucose range is generally 130-140 mg/dl. An ACE inhibitor/angiotensin II receptor blocker is being taken.  Depression        This is a new problem.  The current episode started more than 1 month ago.   The onset quality is gradual.   The problem occurs daily.  The problem has been waxing and waning since onset.  Associated symptoms include insomnia and sad.  Associated symptoms include no decreased concentration, no fatigue, no helplessness, no hopelessness, not irritable, no restlessness, no decreased interest, no appetite change, no body aches, no myalgias, no headaches, no indigestion and no suicidal ideas.  Compliance with treatment is good.   Pertinent negatives include no hypothyroidism and no anxiety. Hypertension This is a chronic problem. The current episode started more than 1 year ago. The  problem is unchanged. The problem is controlled. Pertinent negatives include no anxiety, blurred vision, chest pain, headaches, malaise/fatigue, neck pain, orthopnea, palpitations, peripheral edema, PND, shortness of breath or sweats. There are no associated agents to hypertension. There are no known risk factors for coronary artery disease. Past treatments include angiotensin blockers. The current treatment provides moderate improvement. There are no compliance problems.  There is no history of angina, kidney disease, CAD/MI, CVA, heart failure, left ventricular hypertrophy, PVD or retinopathy. There is no history of chronic renal disease, a hypertension causing med or renovascular disease.  Hyperlipidemia This is a chronic problem. The current episode started more than 1 year ago. The problem is controlled. Recent lipid tests were reviewed and are normal. She has no history of chronic renal disease, diabetes, hypothyroidism, liver disease, obesity or nephrotic syndrome. Factors aggravating her hyperlipidemia include thiazides. Pertinent negatives include no chest pain, focal sensory loss, focal weakness, leg pain, myalgias or shortness of breath. Current antihyperlipidemic treatment includes statins. The current treatment provides moderate improvement of lipids. There are no compliance problems.  Risk factors for coronary artery disease include dyslipidemia and hypertension.    Lab Results  Component Value Date   CREATININE 0.71 03/01/2018   BUN 10 03/01/2018   NA 138 03/01/2018   K 3.7 03/01/2018   CL 101 03/01/2018   CO2 23 03/01/2018   Lab Results  Component Value Date   CHOL 164 03/01/2018   HDL 61 03/01/2018   LDLCALC 90 03/01/2018   TRIG 67 03/01/2018   CHOLHDL 2.7 03/01/2018   No results found for: TSH Lab Results  Component Value Date   HGBA1C 7.0 (H) 07/06/2018     Review of Systems  Constitutional: Negative for appetite change,  chills, fatigue, fever, malaise/fatigue and  weight loss.  HENT: Negative for drooling, ear discharge, ear pain and sore throat.   Eyes: Negative for blurred vision.  Respiratory: Negative for cough, shortness of breath and wheezing.   Cardiovascular: Negative for chest pain, palpitations, orthopnea, leg swelling and PND.  Gastrointestinal: Negative for abdominal pain, blood in stool, constipation, diarrhea and nausea.  Endocrine: Negative for polydipsia, polyphagia and polyuria.  Genitourinary: Negative for dysuria, frequency, hematuria, impotence and urgency.  Musculoskeletal: Negative for back pain, myalgias and neck pain.  Skin: Negative for rash.  Allergic/Immunologic: Negative for environmental allergies.  Neurological: Negative for dizziness, focal weakness, weakness and headaches.  Hematological: Does not bruise/bleed easily.  Psychiatric/Behavioral: Positive for depression. Negative for decreased concentration and suicidal ideas. The patient has insomnia. The patient is not nervous/anxious.     Patient Active Problem List   Diagnosis Date Noted  . Need for vaccination against Streptococcus pneumoniae using pneumococcal conjugate vaccine 13 01/07/2017  . PNA (pneumonia) 07/09/2015  . Acute chest pain 03/20/2014  . Type 2 diabetes mellitus ([place]) 03/20/2014  . Hypercholesterolemia 03/20/2014  . Essential hypertension 03/20/2014    Allergies  Allergen Reactions  . Penicillins Other (See Comments)    Past Surgical History:  Procedure Laterality Date  . CATARACT EXTRACTION Bilateral 2014  . COLONOSCOPY  2011   cleared for 5 yrs- [facility]  . ECTOPIC PREGNANCY SURGERY      Social History   Tobacco Use  . Smoking status: Never Smoker  . Smokeless tobacco: Never Used  . Tobacco comment: smoking cessation materials not required  Substance Use Topics  . Alcohol use: No    Alcohol/week: 0.0 standard drinks  . Drug use: No     Medication list has been reviewed and updated.  Current Meds  Medication Sig  . aspirin  81 MG chewable tablet Chew 1 tablet (81 mg total) by mouth daily.  Marland Kitchen glipiZIDE (GLUCOTROL XL) 5 MG 24 hr tablet Take 1 tablet (5 mg total) by mouth daily.  . hydrOXYzine (ATARAX/VISTARIL) 10 MG tablet Take 1 tablet (10 mg total) by mouth at bedtime as needed.  Marland Kitchen losartan (COZAAR) 25 MG tablet Take 1 tablet (25 mg total) by mouth daily.  Marland Kitchen lovastatin (MEVACOR) 40 MG tablet Take 1 tablet (40 mg total) by mouth daily.  . meloxicam (MOBIC) 15 MG tablet Take 1 tablet (15 mg total) by mouth daily.  . metFORMIN (GLUCOPHAGE) 1000 MG tablet Take 1 tablet (1,000 mg total) by mouth 2 (two) times daily.  . Omega-3 Fatty Acids (FISH OIL) 1000 MG CAPS Take 1 capsule (1,000 mg total) by mouth daily.    PHQ 2/9 Scores 01/25/2019 07/06/2018 01/25/2018 09/07/2017  PHQ - 2 Score 0 1 0 0  PHQ- 9 Score 2 6 0 0    BP Readings from Last 3 Encounters:  01/25/19 130/62  07/06/18 130/64  03/01/18 138/82    Physical Exam Vitals signs and nursing note reviewed.  Constitutional:      General: She is not irritable.    Appearance: She is well-developed.  HENT:     Head: Normocephalic.     Right Ear: Tympanic membrane and external ear normal.     Left Ear: Tympanic membrane and external ear normal.     Nose: Nose normal.  Eyes:     General: Lids are everted, no foreign bodies appreciated. No scleral icterus.       Left eye: No foreign body or hordeolum.     Conjunctiva/sclera:  Conjunctivae normal.     Right eye: Right conjunctiva is not injected.     Left eye: Left conjunctiva is not injected.     Pupils: Pupils are equal, round, and reactive to light.  Neck:     Musculoskeletal: Normal range of motion and neck supple.     Thyroid: No thyromegaly.     Vascular: No JVD.     Trachea: No tracheal deviation.  Cardiovascular:     Rate and Rhythm: Normal rate and regular rhythm.     Heart sounds: Normal heart sounds. No murmur. No friction rub. No gallop.   Pulmonary:     Effort: Pulmonary effort is normal.  No respiratory distress.     Breath sounds: Normal breath sounds. No wheezing or rales.  Abdominal:     General: Bowel sounds are normal.     Palpations: Abdomen is soft. There is no mass.     Tenderness: There is no abdominal tenderness. There is no guarding or rebound.  Musculoskeletal: Normal range of motion.        General: No tenderness.  Lymphadenopathy:     Cervical: No cervical adenopathy.  Skin:    General: Skin is warm.     Findings: No rash.  Neurological:     Mental Status: She is alert and oriented to person, place, and time.     Cranial Nerves: No cranial nerve deficit.     Deep Tendon Reflexes: Reflexes normal.  Psychiatric:        Mood and Affect: Mood is not anxious or depressed.     Wt Readings from Last 3 Encounters:  01/25/19 147 lb (66.7 kg)  07/06/18 152 lb (68.9 kg)  03/01/18 157 lb (71.2 kg)    BP 130/62   Pulse 68   Ht 5\' 7"  (1.702 m)   Wt 147 lb (66.7 kg)   BMI 23.02 kg/m   Assessment and Plan: 1. Adjustment insomnia Patient has difficulty initiating sleep.  We will continue on hydroxyzine 10 mg once a day and we have also started sertraline 25 mg because of recent life events that have caused a reactive depression. - hydrOXYzine (ATARAX/VISTARIL) 10 MG tablet; Take 1 tablet (10 mg total) by mouth at bedtime as needed.  Dispense: 30 tablet; Refill: 5 - sertraline (ZOLOFT) 25 MG tablet; Take 1 tablet (25 mg total) by mouth daily.  Dispense: 30 tablet; Refill: 1  2. Type 2 diabetes mellitus without complication, without long-term current use of insulin (HCC) Chronic.  Controlled.  Continue Metformin 1 g twice a day and glipizide XL 5 mg once a day.  Will check A1c and complete metabolic panel. - HgB 123456 - COMPLETE METABOLIC PANEL WITH GFR - losartan (COZAAR) 25 MG tablet; Take 1 tablet (25 mg total) by mouth daily.  Dispense: 90 tablet; Refill: 1 - metFORMIN (GLUCOPHAGE) 1000 MG tablet; Take 1 tablet (1,000 mg total) by mouth 2 (two) times daily.   Dispense: 180 tablet; Refill: 1 - glipiZIDE (GLUCOTROL XL) 5 MG 24 hr tablet; Take 1 tablet (5 mg total) by mouth daily.  Dispense: 90 tablet; Refill: 1  3. Essential hypertension Chronic.  Controlled.  Stable.  Continue Cozaar 25 mg once a day.  Will check complete metabolic panel.  Will recheck in 6 months. - COMPLETE METABOLIC PANEL WITH GFR - losartan (COZAAR) 25 MG tablet; Take 1 tablet (25 mg total) by mouth daily.  Dispense: 90 tablet; Refill: 1  4. Hypercholesterolemia Chronic.  Controlled.  Stable.  Continue lovastatin  40 mg once a day.  Will check lipid panel.  Recheck patient in 6 months. - lovastatin (MEVACOR) 40 MG tablet; Take 1 tablet (40 mg total) by mouth daily.  Dispense: 90 tablet; Refill: 1 - Omega-3 Fatty Acids (FISH OIL) 1000 MG CAPS; Take 1 capsule (1,000 mg total) by mouth daily.  Dispense: 100 capsule; Refill: 3 - metFORMIN (GLUCOPHAGE) 1000 MG tablet; Take 1 tablet (1,000 mg total) by mouth 2 (two) times daily.  Dispense: 180 tablet; Refill: 1  5. Reactive depression Gad score is was 1 with a PHQ of 3 although I suspect that is prior higher than this patient is having some despondency due to some life events and recent deaths in the family.  We will initiate some sertraline 25 mg once a day and will recheck in 6 weeks. - sertraline (ZOLOFT) 25 MG tablet; Take 1 tablet (25 mg total) by mouth daily.  Dispense: 30 tablet; Refill: 1  6. Need for pneumococcal vaccination Discussed and administered - Pneumococcal polysaccharide vaccine 23-valent greater than or equal to 2yo subcutaneous/IM  7. Influenza vaccine needed Discussed and administered - Flu Vaccine QUAD High Dose(Fluad)

## 2019-01-26 LAB — CMP14+EGFR
ALT: 12 IU/L (ref 0–32)
AST: 21 IU/L (ref 0–40)
Albumin/Globulin Ratio: 1.6 (ref 1.2–2.2)
Albumin: 4.5 g/dL (ref 3.6–4.6)
Alkaline Phosphatase: 74 IU/L (ref 39–117)
BUN/Creatinine Ratio: 26 (ref 12–28)
BUN: 18 mg/dL (ref 8–27)
Bilirubin Total: 0.5 mg/dL (ref 0.0–1.2)
CO2: 24 mmol/L (ref 20–29)
Calcium: 9.7 mg/dL (ref 8.7–10.3)
Chloride: 102 mmol/L (ref 96–106)
Creatinine, Ser: 0.7 mg/dL (ref 0.57–1.00)
GFR calc Af Amer: 93 mL/min/{1.73_m2} (ref >59)
GFR calc non Af Amer: 81 mL/min/{1.73_m2} (ref >59)
Globulin, Total: 2.9 g/dL (ref 1.5–4.5)
Glucose: 136 mg/dL — ABNORMAL HIGH (ref 65–99)
Potassium: 4.4 mmol/L (ref 3.5–5.2)
Sodium: 138 mmol/L (ref 134–144)
Total Protein: 7.4 g/dL (ref 6.0–8.5)

## 2019-01-26 LAB — HEMOGLOBIN A1C
Est. average glucose Bld gHb Est-mCnc: 146 mg/dL
Hgb A1c MFr Bld: 6.7 % — ABNORMAL HIGH (ref 4.8–5.6)

## 2019-04-19 ENCOUNTER — Ambulatory Visit (INDEPENDENT_AMBULATORY_CARE_PROVIDER_SITE_OTHER): Payer: Medicare Other | Admitting: Family Medicine

## 2019-04-19 ENCOUNTER — Other Ambulatory Visit: Payer: Self-pay

## 2019-04-19 ENCOUNTER — Encounter: Payer: Self-pay | Admitting: Family Medicine

## 2019-04-19 DIAGNOSIS — E119 Type 2 diabetes mellitus without complications: Secondary | ICD-10-CM

## 2019-04-19 DIAGNOSIS — E7800 Pure hypercholesterolemia, unspecified: Secondary | ICD-10-CM

## 2019-04-19 DIAGNOSIS — I10 Essential (primary) hypertension: Secondary | ICD-10-CM | POA: Diagnosis not present

## 2019-04-19 DIAGNOSIS — F329 Major depressive disorder, single episode, unspecified: Secondary | ICD-10-CM

## 2019-04-19 DIAGNOSIS — F5102 Adjustment insomnia: Secondary | ICD-10-CM

## 2019-04-19 MED ORDER — LOVASTATIN 40 MG PO TABS: 40.0000 mg | ORAL_TABLET | Freq: Every day | ORAL | 1 refills | Status: DC

## 2019-04-19 MED ORDER — METFORMIN HCL 1000 MG PO TABS: 1000.0000 mg | ORAL_TABLET | Freq: Two times a day (BID) | ORAL | 1 refills | Status: DC

## 2019-04-19 MED ORDER — HYDROXYZINE HCL 10 MG PO TABS: 10.0000 mg | ORAL_TABLET | Freq: Every evening | ORAL | 1 refills | Status: DC | PRN

## 2019-04-19 MED ORDER — SERTRALINE HCL 25 MG PO TABS: 25.0000 mg | ORAL_TABLET | Freq: Every day | ORAL | 1 refills | Status: DC

## 2019-04-19 MED ORDER — GLIPIZIDE ER 5 MG PO TB24: 5.0000 mg | ORAL_TABLET | Freq: Every day | ORAL | 1 refills | Status: DC

## 2019-04-19 MED ORDER — LOSARTAN POTASSIUM 25 MG PO TABS: 25.0000 mg | ORAL_TABLET | Freq: Every day | ORAL | 1 refills | Status: DC

## 2019-04-19 MED ORDER — FISH OIL 1000 MG PO CAPS: 1.0000 | ORAL_CAPSULE | Freq: Every day | ORAL | 3 refills | Status: DC

## 2019-04-19 NOTE — Progress Notes (Signed)
Date:  04/19/2019   Name:  Cristina Morgan   DOB:  1934-07-29   MRN:  [id number]   Chief Complaint: Diabetes, Hyperlipidemia, Hypertension, and Depression  Diabetes She presents for her follow-up diabetic visit. She has type 2 diabetes mellitus. Her disease course has been stable. There are no hypoglycemic associated symptoms. Pertinent negatives for hypoglycemia include no dizziness, headaches or nervousness/anxiousness. Associated symptoms include fatigue. Pertinent negatives for diabetes include no chest pain, no polydipsia, no polyphagia, no polyuria and no weakness. There are no hypoglycemic complications. Symptoms are stable. There are no diabetic complications. Pertinent negatives for diabetic complications include no CVA, PVD or retinopathy. Risk factors for coronary artery disease include diabetes mellitus, dyslipidemia and hypertension. Current diabetic treatment includes oral agent (dual therapy). She is compliant with treatment all of the time. Her weight is stable. She is following a generally healthy diet. Meal planning includes avoidance of concentrated sweets and carbohydrate counting. Her home blood glucose trend is decreasing steadily. Her breakfast blood glucose is taken between 8-9 am. Her breakfast blood glucose range is generally 110-130 mg/dl. An ACE inhibitor/angiotensin II receptor blocker is being taken.  Hyperlipidemia This is a chronic problem. The current episode started more than 1 year ago. The problem is controlled. Recent lipid tests were reviewed and are normal. She has no history of chronic renal disease, diabetes, hypothyroidism, liver disease, obesity or nephrotic syndrome. Pertinent negatives include no chest pain, focal sensory loss, focal weakness, leg pain, myalgias or shortness of breath. Current antihyperlipidemic treatment includes statins. The current treatment provides moderate improvement of lipids. Risk factors for coronary artery disease include  dyslipidemia.  Hypertension This is a chronic problem. The current episode started more than 1 year ago. The problem has been gradually improving since onset. The problem is controlled. Pertinent negatives include no chest pain, headaches, malaise/fatigue, orthopnea, palpitations, PND or shortness of breath. There are no associated agents to hypertension. Risk factors for coronary artery disease include dyslipidemia and diabetes mellitus. Past treatments include angiotensin blockers. The current treatment provides moderate improvement. Hypertensive end-organ damage includes angina. There is no history of kidney disease, CAD/MI, CVA, heart failure, left ventricular hypertrophy, PVD or retinopathy. There is no history of chronic renal disease, a hypertension causing med or renovascular disease.  Depression        This is a chronic problem.  The problem occurs intermittently.  The problem has been gradually improving since onset.  Associated symptoms include fatigue, restlessness and sad.  Associated symptoms include no decreased interest, no myalgias, no headaches and no suicidal ideas.  Past treatments include SSRIs - Selective serotonin reuptake inhibitors.   Pertinent negatives include no hypothyroidism.   Lab Results  Component Value Date   CREATININE 0.70 01/25/2019   BUN 18 01/25/2019   NA 138 01/25/2019   K 4.4 01/25/2019   CL 102 01/25/2019   CO2 24 01/25/2019   Lab Results  Component Value Date   CHOL 164 03/01/2018   HDL 61 03/01/2018   LDLCALC 90 03/01/2018   TRIG 67 03/01/2018   CHOLHDL 2.7 03/01/2018   No results found for: TSH Lab Results  Component Value Date   HGBA1C 6.7 (H) 01/25/2019     Review of Systems  Constitutional: Positive for fatigue. Negative for chills, fever, malaise/fatigue and unexpected weight change.  HENT: Negative for congestion, ear discharge, ear pain, rhinorrhea, sinus pressure, sneezing and sore throat.   Eyes: Negative for photophobia, pain,  discharge, redness and itching.  Respiratory:  Negative for cough, shortness of breath, wheezing and stridor.   Cardiovascular: Negative for chest pain, palpitations, orthopnea and PND.  Gastrointestinal: Negative for abdominal pain, blood in stool, constipation, diarrhea, nausea and vomiting.  Endocrine: Negative for cold intolerance, heat intolerance, polydipsia, polyphagia and polyuria.  Genitourinary: Negative for dysuria, flank pain, frequency, hematuria, menstrual problem, pelvic pain, urgency, vaginal bleeding and vaginal discharge.  Musculoskeletal: Negative for arthralgias, back pain and myalgias.  Skin: Negative for rash.  Allergic/Immunologic: Negative for environmental allergies and food allergies.  Neurological: Negative for dizziness, focal weakness, weakness, light-headedness, numbness and headaches.  Hematological: Negative for adenopathy. Does not bruise/bleed easily.  Psychiatric/Behavioral: Positive for depression. Negative for dysphoric mood and suicidal ideas. The patient is not nervous/anxious.     Patient Active Problem List   Diagnosis Date Noted  . Need for vaccination against Streptococcus pneumoniae using pneumococcal conjugate vaccine 13 01/07/2017  . PNA (pneumonia) 07/09/2015  . Acute chest pain 03/20/2014  . Type 2 diabetes mellitus ([place]) 03/20/2014  . Hypercholesterolemia 03/20/2014  . Essential hypertension 03/20/2014    Allergies  Allergen Reactions  . Penicillins Other (See Comments)    Past Surgical History:  Procedure Laterality Date  . CATARACT EXTRACTION Bilateral 2014  . COLONOSCOPY  2011   cleared for 5 yrs- [facility]  . ECTOPIC PREGNANCY SURGERY      Social History   Tobacco Use  . Smoking status: Never Smoker  . Smokeless tobacco: Never Used  . Tobacco comment: smoking cessation materials not required  Substance Use Topics  . Alcohol use: No    Alcohol/week: 0.0 standard drinks  . Drug use: No     Medication list has been reviewed  and updated.  Current Meds  Medication Sig  . aspirin 81 MG chewable tablet Chew 1 tablet (81 mg total) by mouth daily.  Marland Kitchen glipiZIDE (GLUCOTROL XL) 5 MG 24 hr tablet Take 1 tablet (5 mg total) by mouth daily.  . hydrOXYzine (ATARAX/VISTARIL) 10 MG tablet Take 1 tablet (10 mg total) by mouth at bedtime as needed.  Marland Kitchen losartan (COZAAR) 25 MG tablet Take 1 tablet (25 mg total) by mouth daily.  Marland Kitchen lovastatin (MEVACOR) 40 MG tablet Take 1 tablet (40 mg total) by mouth daily.  . meloxicam (MOBIC) 15 MG tablet Take 1 tablet (15 mg total) by mouth daily.  . metFORMIN (GLUCOPHAGE) 1000 MG tablet Take 1 tablet (1,000 mg total) by mouth 2 (two) times daily.  . Omega-3 Fatty Acids (FISH OIL) 1000 MG CAPS Take 1 capsule (1,000 mg total) by mouth daily.  . sertraline (ZOLOFT) 25 MG tablet Take 1 tablet (25 mg total) by mouth daily.    PHQ 2/9 Scores 04/19/2019 01/25/2019 07/06/2018 01/25/2018  PHQ - 2 Score 0 0 1 0  PHQ- 9 Score 3 2 6  0    BP Readings from Last 3 Encounters:  04/19/19 130/60  01/25/19 130/62  07/06/18 130/64    Physical Exam Vitals and nursing note reviewed.  Constitutional:      General: She is not in acute distress.    Appearance: She is not diaphoretic.  HENT:     Head: Normocephalic and atraumatic.     Right Ear: External ear normal.     Left Ear: External ear normal.     Nose: Nose normal.  Eyes:     General:        Right eye: No discharge.        Left eye: No discharge.     Conjunctiva/sclera:  Conjunctivae normal.     Pupils: Pupils are equal, round, and reactive to light.  Neck:     Thyroid: No thyromegaly.     Vascular: No JVD.  Cardiovascular:     Rate and Rhythm: Normal rate and regular rhythm.     Heart sounds: Normal heart sounds. No murmur. No friction rub. No gallop.   Pulmonary:     Effort: Pulmonary effort is normal.     Breath sounds: Normal breath sounds.  Abdominal:     General: Bowel sounds are normal.     Palpations: Abdomen is soft. There is no  mass.     Tenderness: There is no abdominal tenderness. There is no guarding.  Musculoskeletal:        General: Normal range of motion.     Cervical back: Normal range of motion and neck supple.  Lymphadenopathy:     Cervical: No cervical adenopathy.  Skin:    General: Skin is warm and dry.  Neurological:     Mental Status: She is alert.     Deep Tendon Reflexes: Reflexes are normal and symmetric.     Wt Readings from Last 3 Encounters:  04/19/19 148 lb (67.1 kg)  01/25/19 147 lb (66.7 kg)  07/06/18 152 lb (68.9 kg)    BP 130/60   Pulse 60   Ht 5\' 7"  (1.702 m)   Wt 148 lb (67.1 kg)   BMI 23.18 kg/m   Assessment and Plan: 1. Adjustment insomnia Chronic.  Persistent.  Controlled.  Continue Atarax/hydroxyzine 10 mg nightly.  Patient will also continue sertraline 25 mg daily as well. - hydrOXYzine (ATARAX/VISTARIL) 10 MG tablet; Take 1 tablet (10 mg total) by mouth at bedtime as needed.  Dispense: 90 tablet; Refill: 1 - sertraline (ZOLOFT) 25 MG tablet; Take 1 tablet (25 mg total) by mouth daily.  Dispense: 90 tablet; Refill: 1  2. Reactive depression Chronic.  Controlled.  Stable.  PHQ 3.  This is an improvement.  We will continue sertraline at 25 mg once a day.  Given age we will not go up to 24 yet but that is an option. - sertraline (ZOLOFT) 25 MG tablet; Take 1 tablet (25 mg total) by mouth daily.  Dispense: 90 tablet; Refill: 1  3. Type 2 diabetes mellitus without complication, without long-term current use of insulin (HCC) Chronic.  Controlled.  Stable.   We will also continue Metformin 1 g twice a day and glipizide XL 5 mg once a day. - losartan (COZAAR) 25 MG tablet; Take 1 tablet (25 mg total) by mouth daily.  Dispense: 90 tablet; Refill: 1 - metFORMIN (GLUCOPHAGE) 1000 MG tablet; Take 1 tablet (1,000 mg total) by mouth 2 (two) times daily.  Dispense: 180 tablet; Refill: 1 - glipiZIDE (GLUCOTROL XL) 5 MG 24 hr tablet; Take 1 tablet (5 mg total) by mouth daily.   Dispense: 90 tablet; Refill: 1  4. Essential hypertension Chronic.  Controlled.  Stable.  Continue losartan 25 mg once a day. - losartan (COZAAR) 25 MG tablet; Take 1 tablet (25 mg total) by mouth daily.  Dispense: 90 tablet; Refill: 1  5. Hypercholesterolemia Chronic.  Controlled.  Stable.  Patient will continue lovastatin 40 mg once a day.  As well as continue over-the-counter omega-3 1 g daily. - Omega-3 Fatty Acids (FISH OIL) 1000 MG CAPS; Take 1 capsule (1,000 mg total) by mouth daily.  Dispense: 100 capsule; Refill: 3 - lovastatin (MEVACOR) 40 MG tablet; Take 1 tablet (40 mg total) by  mouth daily.  Dispense: 90 tablet; Refill: 1 - metFORMIN (GLUCOPHAGE) 1000 MG tablet; Take 1 tablet (1,000 mg total) by mouth 2 (two) times daily.  Dispense: 180 tablet; Refill: 1

## 2019-04-28 DIAGNOSIS — E113393 Type 2 diabetes mellitus with moderate nonproliferative diabetic retinopathy without macular edema, bilateral: Secondary | ICD-10-CM | POA: Diagnosis not present

## 2019-05-16 LAB — HM DIABETES EYE EXAM

## 2019-07-29 DIAGNOSIS — H6123 Impacted cerumen, bilateral: Secondary | ICD-10-CM | POA: Diagnosis not present

## 2019-08-01 ENCOUNTER — Other Ambulatory Visit: Payer: Self-pay | Admitting: Family Medicine

## 2019-08-01 DIAGNOSIS — H6123 Impacted cerumen, bilateral: Secondary | ICD-10-CM | POA: Diagnosis not present

## 2019-08-01 NOTE — Telephone Encounter (Signed)
Requested medication (s) are due for refill today:Yes  Requested medication (s) are on the active medication list:Yes  Last refill: 07/06/18  Future visit scheduled:Yes  Notes to clinic:  Expired Rx, unable to refill per protocol     Requested Prescriptions  Pending Prescriptions Disp Refills   meloxicam (MOBIC) 15 MG tablet [Pharmacy Med Name: Meloxicam 15 MG Oral Tablet] 30 tablet 0    Sig: Take 1 tablet by mouth once daily      Analgesics:  COX2 Inhibitors Failed - 08/01/2019 10:44 AM      Failed - HGB in normal range and within 360 days    No results found for: HGB, HGBKUC, HGBPOCKUC, HGBOTHER, TOTHGB, HGBPLASMA        Passed - Cr in normal range and within 360 days    Creatinine, Ser  Date Value Ref Range Status  01/25/2019 0.70 0.57 - 1.00 mg/dL Final          Passed - Patient is not pregnant      Passed - Valid encounter within last 12 months    Recent Outpatient Visits           3 months ago Adjustment insomnia   [facility] Juline Patch, MD   6 months ago Type 2 diabetes mellitus without complication, without long-term current use of insulin ([place])   [facility] Juline Patch, MD   1 year ago Type 2 diabetes mellitus without complication, without long-term current use of insulin ([place])   [facility] Juline Patch, MD   1 year ago Strain of supraspinatus muscle or tendon, left, subsequent encounter   [facility] Juline Patch, MD   1 year ago Impingement syndrome of left shoulder   [facility] Juline Patch, MD

## 2019-08-03 ENCOUNTER — Ambulatory Visit (INDEPENDENT_AMBULATORY_CARE_PROVIDER_SITE_OTHER): Payer: Medicare Other

## 2019-08-03 VITALS — Ht 67.0 in | Wt 145.0 lb

## 2019-08-03 DIAGNOSIS — Z0000 Encounter for general adult medical examination without abnormal findings: Secondary | ICD-10-CM | POA: Diagnosis not present

## 2019-08-03 NOTE — Patient Instructions (Signed)
Cristina Morgan , Thank you for taking time to come for your Medicare Wellness Visit. I appreciate your ongoing commitment to your health goals. Please review the following plan we discussed and let me know if I can assist you in the future.   Screening recommendations/referrals: Colonoscopy: no longer required Mammogram: no longer require Bone Density: done 09/24/17 Recommended yearly ophthalmology/optometry visit for glaucoma screening and checkup Recommended yearly dental visit for hygiene and checkup  Vaccinations: Influenza vaccine: done 01/25/19 Pneumococcal vaccine: done 01/25/19 Tdap vaccine: due Shingles vaccine: Shingrix discussed. Please contact your pharmacy for coverage information.  Covid-19: done 06/04/19 & 07/05/19  Advanced directives: Advance directive discussed with you today. I have provided a copy for you to complete at home and have notarized. Once this is complete please bring a copy in to our office so we can scan it into your chart.  Conditions/risks identified: Recommend increasing physical activity  Next appointment: Please follow up in one year for your Medicare Annual Wellness visit.     Preventive Care 60 Years and Older, Female Preventive care refers to lifestyle choices and visits with your health care provider that can promote health and wellness. What does preventive care include?  A yearly physical exam. This is also called an annual well check.  Dental exams once or twice a year.  Routine eye exams. Ask your health care provider how often you should have your eyes checked.  Personal lifestyle choices, including:  Daily care of your teeth and gums.  Regular physical activity.  Eating a healthy diet.  Avoiding tobacco and drug use.  Limiting alcohol use.  Practicing safe sex.  Taking low-dose aspirin every day.  Taking vitamin and mineral supplements as recommended by your health care provider. What happens during an annual well check? The  services and screenings done by your health care provider during your annual well check will depend on your age, overall health, lifestyle risk factors, and family history of disease. Counseling  Your health care provider may ask you questions about your:  Alcohol use.  Tobacco use.  Drug use.  Emotional well-being.  Home and relationship well-being.  Sexual activity.  Eating habits.  History of falls.  Memory and ability to understand (cognition).  Work and work Statistician.  Reproductive health. Screening  You may have the following tests or measurements:  Height, weight, and BMI.  Blood pressure.  Lipid and cholesterol levels. These may be checked every 5 years, or more frequently if you are over 36 years old.  Skin check.  Lung cancer screening. You may have this screening every year starting at age 42 if you have a 30-pack-year history of smoking and currently smoke or have quit within the past 15 years.  Fecal occult blood test (FOBT) of the stool. You may have this test every year starting at age 50.  Flexible sigmoidoscopy or colonoscopy. You may have a sigmoidoscopy every 5 years or a colonoscopy every 10 years starting at age 74.  Hepatitis C blood test.  Hepatitis B blood test.  Sexually transmitted disease (STD) testing.  Diabetes screening. This is done by checking your blood sugar (glucose) after you have not eaten for a while (fasting). You may have this done every 1-3 years.  Bone density scan. This is done to screen for osteoporosis. You may have this done starting at age 41.  Mammogram. This may be done every 1-2 years. Talk to your health care provider about how often you should have regular mammograms. Talk  with your health care provider about your test results, treatment options, and if necessary, the need for more tests. Vaccines  Your health care provider may recommend certain vaccines, such as:  Influenza vaccine. This is recommended  every year.  Tetanus, diphtheria, and acellular pertussis (Tdap, Td) vaccine. You may need a Td booster every 10 years.  Zoster vaccine. You may need this after age 11.  Pneumococcal 13-valent conjugate (PCV13) vaccine. One dose is recommended after age 34.  Pneumococcal polysaccharide (PPSV23) vaccine. One dose is recommended after age 83. Talk to your health care provider about which screenings and vaccines you need and how often you need them. This information is not intended to replace advice given to you by your health care provider. Make sure you discuss any questions you have with your health care provider. Document Released: 03/30/2015 Document Revised: 11/21/2015 Document Reviewed: 01/02/2015 Elsevier Interactive Patient Education  2017 [place] Prevention in the Home Falls can cause injuries. They can happen to people of all ages. There are many things you can do to make your home safe and to help prevent falls. What can I do on the outside of my home?  Regularly fix the edges of walkways and driveways and fix any cracks.  Remove anything that might make you trip as you walk through a door, such as a raised step or threshold.  Trim any bushes or trees on the path to your home.  Use bright outdoor lighting.  Clear any walking paths of anything that might make someone trip, such as rocks or tools.  Regularly check to see if handrails are loose or broken. Make sure that both sides of any steps have handrails.  Any raised decks and porches should have guardrails on the edges.  Have any leaves, snow, or ice cleared regularly.  Use sand or salt on walking paths during winter.  Clean up any spills in your garage right away. This includes oil or grease spills. What can I do in the bathroom?  Use night lights.  Install grab bars by the toilet and in the tub and shower. Do not use towel bars as grab bars.  Use non-skid mats or decals in the tub or shower.  If  you need to sit down in the shower, use a plastic, non-slip stool.  Keep the floor dry. Clean up any water that spills on the floor as soon as it happens.  Remove soap buildup in the tub or shower regularly.  Attach bath mats securely with double-sided non-slip rug tape.  Do not have throw rugs and other things on the floor that can make you trip. What can I do in the bedroom?  Use night lights.  Make sure that you have a light by your bed that is easy to reach.  Do not use any sheets or blankets that are too big for your bed. They should not hang down onto the floor.  Have a firm chair that has side arms. You can use this for support while you get dressed.  Do not have throw rugs and other things on the floor that can make you trip. What can I do in the kitchen?  Clean up any spills right away.  Avoid walking on wet floors.  Keep items that you use a lot in easy-to-reach places.  If you need to reach something above you, use a strong step stool that has a grab bar.  Keep electrical cords out of the way.  Do  not use floor polish or wax that makes floors slippery. If you must use wax, use non-skid floor wax.  Do not have throw rugs and other things on the floor that can make you trip. What can I do with my stairs?  Do not leave any items on the stairs.  Make sure that there are handrails on both sides of the stairs and use them. Fix handrails that are broken or loose. Make sure that handrails are as long as the stairways.  Check any carpeting to make sure that it is firmly attached to the stairs. Fix any carpet that is loose or worn.  Avoid having throw rugs at the top or bottom of the stairs. If you do have throw rugs, attach them to the floor with carpet tape.  Make sure that you have a light switch at the top of the stairs and the bottom of the stairs. If you do not have them, ask someone to add them for you. What else can I do to help prevent falls?  Wear shoes  that:  Do not have high heels.  Have rubber bottoms.  Are comfortable and fit you well.  Are closed at the toe. Do not wear sandals.  If you use a stepladder:  Make sure that it is fully opened. Do not climb a closed stepladder.  Make sure that both sides of the stepladder are locked into place.  Ask someone to hold it for you, if possible.  Clearly mark and make sure that you can see:  Any grab bars or handrails.  First and last steps.  Where the edge of each step is.  Use tools that help you move around (mobility aids) if they are needed. These include:  Canes.  Walkers.  Scooters.  Crutches.  Turn on the lights when you go into a dark area. Replace any light bulbs as soon as they burn out.  Set up your furniture so you have a clear path. Avoid moving your furniture around.  If any of your floors are uneven, fix them.  If there are any pets around you, be aware of where they are.  Review your medicines with your doctor. Some medicines can make you feel dizzy. This can increase your chance of falling. Ask your doctor what other things that you can do to help prevent falls. This information is not intended to replace advice given to you by your health care provider. Make sure you discuss any questions you have with your health care provider. Document Released: 12/28/2008 Document Revised: 08/09/2015 Document Reviewed: 04/07/2014 Elsevier Interactive Patient Education  2017 [employer].

## 2019-08-03 NOTE — Progress Notes (Signed)
Subjective:   Cristina Morgan is a 85 y.o. female who presents for Medicare Annual (Subsequent) preventive examination.  Virtual Visit via Telephone Note  I connected with  Cristina Morgan on 08/03/19 at  2:40 PM EDT by telephone and verified that I am speaking with the correct person using two identifiers.  Medicare Annual Wellness visit completed telephonically due to Covid-19 pandemic.   Location: Patient: home Provider: office   I discussed the limitations, risks, security and privacy concerns of performing an evaluation and management service by telephone and the availability of in person appointments. The patient expressed understanding and agreed to proceed.  Unable to perform video visit due to patient does not have video capability.   Some vital signs may be absent or patient reported.   Cristina Marker, LPN    Review of Systems:   Cardiac Risk Factors include: advanced age (>55men, >81 women);diabetes mellitus;dyslipidemia;hypertension;sedentary lifestyle;family history of premature cardiovascular disease     Objective:     Vitals: Ht 5\' 7"  (1.702 m)   Wt 145 lb (65.8 kg)   BMI 22.71 kg/m   Body mass index is 22.71 kg/m.  Advanced Directives 08/03/2019 09/07/2017 01/03/2015  Does Patient Have a Medical Advance Directive? No No Yes  Type of Advance Directive - - [place]  Does patient want to make changes to medical advance directive? Yes (MAU/Ambulatory/Procedural Areas - Information given) - -  Copy of [place] in Chart? - - No - copy requested  Would patient like information on creating a medical advance directive? - Yes (MAU/Ambulatory/Procedural Areas - Information given) -    Tobacco Social History   Tobacco Use  Smoking Status Never Smoker  Smokeless Tobacco Never Used  Tobacco Comment   smoking cessation materials not required     Counseling given: Not Answered Comment: smoking cessation materials not  required   Clinical Intake:  Pre-visit preparation completed: Yes  Pain : No/denies pain     BMI - recorded: 22.71 Nutritional Status: BMI of 19-24  Normal Nutritional Risks: None Diabetes: Yes CBG done?: No Did pt. bring in CBG monitor from home?: No   Nutrition Risk Assessment:  Has the patient had any N/V/D within the last 2 months?  No  Does the patient have any non-healing wounds?  No  Has the patient had any unintentional weight loss or weight gain?  No   Diabetes:  Is the patient diabetic?  Yes  If diabetic, was a CBG obtained today?  No  Did the patient bring in their glucometer from home?  No  How often do you monitor your CBG's? Every so often per patient.   Financial Strains and Diabetes Management:  Are you having any financial strains with the device, your supplies or your medication? No .  Does the patient want to be seen by Chronic Care Management for management of their diabetes?  No  Would the patient like to be referred to a Nutritionist or for Diabetic Management?  No   Diabetic Exams:  Diabetic Eye Exam: Completed 09/28/18.   Diabetic Foot Exam: Completed 07/06/18. Pt has been advised about the importance in completing this exam.   How often do you need to have someone help you when you read instructions, pamphlets, or other written materials from your doctor or pharmacy?: 1 - Never  Interpreter Needed?: No  Information entered by :: Cristina Marker LPN  Past Medical History:  Diagnosis Date  . Diabetes mellitus without complication (  [place])   . Hyperlipidemia   . Hypertension    Past Surgical History:  Procedure Laterality Date  . CATARACT EXTRACTION Bilateral 2014  . COLONOSCOPY  2011   cleared for 5 yrs- [facility]  . ECTOPIC PREGNANCY SURGERY     Family History  Problem Relation Age of Onset  . Cancer Mother        breast  . Heart disease Mother   . Diabetes Father   . Heart disease Father   . Heart attack Father   . Heart attack Brother    . Diabetes Brother    Social History   Socioeconomic History  . Marital status: Married    Spouse name: Not on file  . Number of children: 2  . Years of education: Not on file  . Highest education level: 12th grade  Occupational History  . Occupation: Retired  Tobacco Use  . Smoking status: Never Smoker  . Smokeless tobacco: Never Used  . Tobacco comment: smoking cessation materials not required  Substance and Sexual Activity  . Alcohol use: No    Alcohol/week: 0.0 standard drinks  . Drug use: No  . Sexual activity: Never  Other Topics Concern  . Not on file  Social History Narrative  . Not on file   Social Determinants of Health   Financial Resource Strain: Low Risk   . Difficulty of Paying Living Expenses: Not hard at all  Food Insecurity: No Food Insecurity  . Worried About Charity fundraiser in the Last Year: Never true  . Ran Out of Food in the Last Year: Never true  Transportation Needs: No Transportation Needs  . Lack of Transportation (Medical): No  . Lack of Transportation (Non-Medical): No  Physical Activity: Inactive  . Days of Exercise per Week: 0 days  . Minutes of Exercise per Session: 0 min  Stress: No Stress Concern Present  . Feeling of Stress : Not at all  Social Connections: Unknown  . Frequency of Communication with Friends and Family: Patient refused  . Frequency of Social Gatherings with Friends and Family: Patient refused  . Attends Religious Services: Patient refused  . Active Member of Clubs or Organizations: Patient refused  . Attends Archivist Meetings: Patient refused  . Marital Status: Married    Outpatient Encounter Medications as of 08/03/2019  Medication Sig  . aspirin 81 MG chewable tablet Chew 1 tablet (81 mg total) by mouth daily.  Marland Kitchen glipiZIDE (GLUCOTROL XL) 5 MG 24 hr tablet Take 1 tablet (5 mg total) by mouth daily.  . hydrOXYzine (ATARAX/VISTARIL) 10 MG tablet Take 1 tablet (10 mg total) by mouth at bedtime as  needed.  Marland Kitchen losartan (COZAAR) 25 MG tablet Take 1 tablet (25 mg total) by mouth daily.  Marland Kitchen lovastatin (MEVACOR) 40 MG tablet Take 1 tablet (40 mg total) by mouth daily.  . metFORMIN (GLUCOPHAGE) 1000 MG tablet Take 1 tablet (1,000 mg total) by mouth 2 (two) times daily.  . Multiple Vitamin (MULTIVITAMIN ADULT PO) Take by mouth.  . Omega-3 Fatty Acids (FISH OIL) 1000 MG CAPS Take 1 capsule (1,000 mg total) by mouth daily.  . sertraline (ZOLOFT) 25 MG tablet Take 1 tablet (25 mg total) by mouth daily.  . meloxicam (MOBIC) 15 MG tablet Take 1 tablet by mouth once daily (Patient not taking: Reported on 08/03/2019)   No facility-administered encounter medications on file as of 08/03/2019.    Activities of Daily Living In your present state of health, do you  have any difficulty performing the following activities: 08/03/2019  Hearing? N  Comment declines hearing aids  Vision? N  Difficulty concentrating or making decisions? N  Walking or climbing stairs? N  Dressing or bathing? N  Doing errands, shopping? N  Preparing Food and eating ? N  Using the Toilet? N  In the past six months, have you accidently leaked urine? N  Do you have problems with loss of bowel control? N  Managing your Medications? N  Managing your Finances? N  Housekeeping or managing your Housekeeping? N  Some recent data might be hidden    Patient Care Team: Juline Patch, MD as PCP - General (Family Medicine) Golden Hurter, MD as Consulting Physician (Obstetrics and Gynecology)    Assessment:   This is a routine wellness examination for Kaybree.  Exercise Activities and Dietary recommendations Current Exercise Habits: The patient does not participate in regular exercise at present, Exercise limited by: None identified  Goals    . DIET - INCREASE WATER INTAKE     Recommend to drink at least 6-8 8oz glasses of water per day.       Fall Risk Fall Risk  08/03/2019 04/19/2019 07/06/2018 01/25/2018 09/07/2017    Falls in the past year? 0 0 0 0 No  Number falls in past yr: 0 - - - -  Injury with Fall? 0 - - - -  Risk for fall due to : No Fall Risks - - - Impaired vision  Risk for fall due to: Comment - - - - wears eyeglasses  Follow up Falls prevention discussed Falls evaluation completed - Falls evaluation completed -   FALL RISK PREVENTION PERTAINING TO THE HOME:  Any stairs in or around the home? Yes  If so, do they handrails? Yes   Home free of loose throw rugs in walkways, pet beds, electrical cords, etc? Yes  Adequate lighting in your home to reduce risk of falls? Yes   ASSISTIVE DEVICES UTILIZED TO PREVENT FALLS:  Life alert? No  Use of a cane, walker or w/c? No  Grab bars in the bathroom? No  Shower chair or bench in shower? No  Elevated toilet seat or a handicapped toilet? No   DME ORDERS:  DME order needed?  No   TIMED UP AND GO:  Was the test performed? No . Telephonic visit.   Education: Fall risk prevention has been discussed.  Intervention(s) required? No    Depression Screen PHQ 2/9 Scores 08/03/2019 04/19/2019 01/25/2019 07/06/2018  PHQ - 2 Score 0 0 0 1  PHQ- 9 Score - 3 2 6      Cognitive Function     6CIT Screen 08/03/2019 09/07/2017  What Year? 0 points 0 points  What month? 0 points 0 points  What time? 0 points 0 points  Count back from 20 0 points 0 points  Months in reverse 0 points 0 points  Repeat phrase 4 points 4 points  Total Score 4 4    Immunization History  Administered Date(s) Administered  . Fluad Quad(high Dose 65+) 01/25/2019  . Influenza, High Dose Seasonal PF 01/07/2017, 01/25/2018  . Influenza,inj,Quad PF,6+ Mos 01/03/2015, 12/27/2015  . Moderna SARS-COVID-2 Vaccination 06/04/2019, 07/05/2019  . Pneumococcal Conjugate-13 01/07/2017  . Pneumococcal Polysaccharide-23 01/25/2019    Qualifies for Shingles Vaccine? Yes . Due for Shingrix. Education has been provided regarding the importance of this vaccine. Pt has been advised to  call insurance company to determine out of pocket expense. Advised may also  receive vaccine at local pharmacy or Health Dept. Verbalized acceptance and understanding.  Tdap: Although this vaccine is not a covered service during a Wellness Exam, does the patient still wish to receive this vaccine today?  No .  Education has been provided regarding the importance of this vaccine. Advised may receive this vaccine at local pharmacy or Health Dept. Aware to provide a copy of the vaccination record if obtained from local pharmacy or Health Dept. Verbalized acceptance and understanding.  Flu Vaccine: Up to date  Pneumococcal Vaccine: Up to date  Covid-19 Vaccine: Up to date   Screening Tests Health Maintenance  Topic Date Due  . FOOT EXAM  07/06/2019  . HEMOGLOBIN A1C  07/25/2019  . TETANUS/TDAP  01/25/2020 (Originally 04/06/1953)  . OPHTHALMOLOGY EXAM  09/28/2019  . INFLUENZA VACCINE  10/16/2019  . DEXA SCAN  Completed  . COVID-19 Vaccine  Completed  . PNA vac Low Risk Adult  Completed    Cancer Screenings:  Colorectal Screening:  No longer required.   Mammogram:  No longer required.   Bone Density: Completed 09/24/17. Results reflect  OSTEOPENIA. Repeat every 2 years. Pt declines repeat screening at this time.   Lung Cancer Screening: (Low Dose CT Chest recommended if Age 63-80 years, 30 pack-year currently smoking OR have quit w/in 15years.) does not qualify.    Additional Screening:  Hepatitis C Screening: no longer required  Vision Screening: Recommended annual ophthalmology exams for early detection of glaucoma and other disorders of the eye. Is the patient up to date with their annual eye exam?  Yes  Who is the provider or what is the name of the office in which the pt attends annual eye exams? Dr. Gloriann Loan  Dental Screening: Recommended annual dental exams for proper oral hygiene  Community Resource Referral:  CRR required this visit?  No      Plan:    I have  personally reviewed and addressed the Medicare Annual Wellness questionnaire and have noted the following in the patient's chart:  A. Medical and social history B. Use of alcohol, tobacco or illicit drugs  C. Current medications and supplements D. Functional ability and status E.  Nutritional status F.  Physical activity G. Advance directives H. List of other physicians I.  Hospitalizations, surgeries, and ER visits in previous 12 months J.  [place] such as hearing and vision if needed, cognitive and depression L. Referrals and appointments   In addition, I have reviewed and discussed with patient certain preventive protocols, quality metrics, and best practice recommendations. A written personalized care plan for preventive services as well as general preventive health recommendations were provided to patient.   Signed,  Cristina Marker, LPN Nurse Health Advisor   Nurse Notes: pt advised due to schedule 4 month follow up

## 2019-08-08 DIAGNOSIS — H6121 Impacted cerumen, right ear: Secondary | ICD-10-CM | POA: Diagnosis not present

## 2019-10-05 DIAGNOSIS — E113393 Type 2 diabetes mellitus with moderate nonproliferative diabetic retinopathy without macular edema, bilateral: Secondary | ICD-10-CM | POA: Diagnosis not present

## 2019-11-27 ENCOUNTER — Other Ambulatory Visit: Payer: Self-pay | Admitting: Family Medicine

## 2019-11-27 DIAGNOSIS — E119 Type 2 diabetes mellitus without complications: Secondary | ICD-10-CM

## 2019-11-27 DIAGNOSIS — E7800 Pure hypercholesterolemia, unspecified: Secondary | ICD-10-CM

## 2019-11-27 DIAGNOSIS — I10 Essential (primary) hypertension: Secondary | ICD-10-CM

## 2019-11-27 NOTE — Telephone Encounter (Signed)
Requested medication (s) are due for refill today: yes  Requested medication (s) are on the active medication list: yes  Last refill:  04/19/19  Future visit scheduled: no  Notes to clinic:  Called pt and LM on VM to call office to make f/u appt- overdue blood work   Requested Prescriptions  Pending Prescriptions Disp Refills   lovastatin (MEVACOR) 40 MG tablet [Pharmacy Med Name: Lovastatin 40 MG Oral Tablet] 90 tablet     Sig: Take 1 tablet by mouth once daily      Cardiovascular:  Antilipid - Statins Failed - 11/27/2019  1:27 PM      Failed - Total Cholesterol in normal range and within 360 days    Cholesterol, Total  Date Value Ref Range Status  03/01/2018 164 100 - 199 mg/dL Final          Failed - LDL in normal range and within 360 days    LDL Calculated  Date Value Ref Range Status  03/01/2018 90 0 - 99 mg/dL Final          Failed - HDL in normal range and within 360 days    HDL  Date Value Ref Range Status  03/01/2018 61 >39 mg/dL Final          Failed - Triglycerides in normal range and within 360 days    Triglycerides  Date Value Ref Range Status  03/01/2018 67 0 - 149 mg/dL Final          Passed - Patient is not pregnant      Passed - Valid encounter within last 12 months    Recent Outpatient Visits           7 months ago Adjustment insomnia   [facility] Juline Patch, MD   10 months ago Type 2 diabetes mellitus without complication, without long-term current use of insulin ([place])   [facility] Juline Patch, MD   1 year ago Type 2 diabetes mellitus without complication, without long-term current use of insulin ([place])   [facility] Juline Patch, MD   1 year ago Strain of supraspinatus muscle or tendon, left, subsequent encounter   [facility] Juline Patch, MD   1 year ago Impingement syndrome of left shoulder   [facility] Juline Patch, MD               Signed Prescriptions Disp  Refills   losartan (COZAAR) 25 MG tablet 30 tablet 0    Sig: Take 1 tablet by mouth once daily      Cardiovascular:  Angiotensin Receptor Blockers Failed - 11/27/2019  1:27 PM      Failed - Cr in normal range and within 180 days    Creatinine, Ser  Date Value Ref Range Status  01/25/2019 0.70 0.57 - 1.00 mg/dL Final          Failed - K in normal range and within 180 days    Potassium  Date Value Ref Range Status  01/25/2019 4.4 3.5 - 5.2 mmol/L Final          Failed - Valid encounter within last 6 months    Recent Outpatient Visits           7 months ago Adjustment insomnia   [facility] Juline Patch, MD   10 months ago Type 2 diabetes mellitus without complication, without long-term current use of insulin ([place])   [facility] Jul[facility], [facility]   1 year ago Type 2 diabetes mellitus without complication, without long-term current use of insulin ([place])   [facility] Juline Patch, MD   1 year ago Strain of supraspinatus muscle or tendon, left, subsequent encounter   [facility], MD   1 year ago Impingement syndrome of left shoulder   [facility] Juline Patch, MD              Passed - Patient is not pregnant      Passed - Last BP in normal range    BP Readings from Last 1 Encounters:  04/19/19 130/60

## 2019-11-27 NOTE — Telephone Encounter (Signed)
Requested Prescriptions  Pending Prescriptions Disp Refills  . lovastatin (MEVACOR) 40 MG tablet [Pharmacy Med Name: Lovastatin 40 MG Oral Tablet] 90 tablet     Sig: Take 1 tablet by mouth once daily     Cardiovascular:  Antilipid - Statins Failed - 11/27/2019  1:27 PM      Failed - Total Cholesterol in normal range and within 360 days    Cholesterol, Total  Date Value Ref Range Status  03/01/2018 164 100 - 199 mg/dL Final         Failed - LDL in normal range and within 360 days    LDL Calculated  Date Value Ref Range Status  03/01/2018 90 0 - 99 mg/dL Final         Failed - HDL in normal range and within 360 days    HDL  Date Value Ref Range Status  03/01/2018 61 >39 mg/dL Final         Failed - Triglycerides in normal range and within 360 days    Triglycerides  Date Value Ref Range Status  03/01/2018 67 0 - 149 mg/dL Final         Passed - Patient is not pregnant      Passed - Valid encounter within last 12 months    Recent Outpatient Visits          7 months ago Adjustment insomnia   [facility] Jul[facility] Patch, MD   10 months ago Type 2 diabetes mellitus without complication, without long-term current use of insulin ([place])   [facility] Juline Patch, MD   1 year ago Type 2 diabetes mellitus without complication, without long-term current use of insulin ([place])   [facility] Juline Patch, MD   1 year ago Strain of supraspinatus muscle or tendon, left, subsequent encounter   [facility] Juline Patch, MD   1 year ago Impingement syndrome of left shoulder   [facility] Juline Patch, MD             . losartan (COZAAR) 25 MG tablet [Pharmacy Med Name: Losartan Potassium 25 MG Oral Tablet] 30 tablet 0    Sig: Take 1 tablet by mouth once daily     Cardiovascular:  Angiotensin Receptor Blockers Failed - 11/27/2019  1:27 PM      Failed - Cr in normal range and within 180 days    Creatinine, Ser  Date Value  Ref Range Status  01/25/2019 0.70 0.57 - 1.00 mg/dL Final         Failed - K in normal range and within 180 days    Potassium  Date Value Ref Range Status  01/25/2019 4.4 3.5 - 5.2 mmol/L Final         Failed - Valid encounter within last 6 months    Recent Outpatient Visits          7 months ago Adjustment insomnia   [facility] Juline Patch, MD   10 months ago Type 2 diabetes mellitus without complication, without long-term current use of insulin ([place])   [facility] Jul[facility] Patch, MD   1 year ago Type 2 diabetes mellitus without complication, without long-term current use of insulin ([place])   [facility] Jul[facility] Patch, MD   1 year ago Strain of supraspinatus muscle or tendon, left, subsequent encounter   [facility] Jul[facility], [facility]   1 year ago Impingement  syndrome of left shoulder   [facility] Jul[facility], MD             Passed - Patient is not pregnant      Passed - Last BP in normal range    BP Readings from Last 1 Encounters:  04/19/19 130/60         '

## 2019-12-02 ENCOUNTER — Ambulatory Visit (INDEPENDENT_AMBULATORY_CARE_PROVIDER_SITE_OTHER): Payer: Medicare Other | Admitting: Family Medicine

## 2019-12-02 ENCOUNTER — Other Ambulatory Visit: Payer: Self-pay

## 2019-12-02 ENCOUNTER — Encounter: Payer: Self-pay | Admitting: Family Medicine

## 2019-12-02 VITALS — BP 130/80 | HR 72 | Ht 67.0 in | Wt 149.0 lb

## 2019-12-02 DIAGNOSIS — E119 Type 2 diabetes mellitus without complications: Secondary | ICD-10-CM | POA: Diagnosis not present

## 2019-12-02 DIAGNOSIS — F5102 Adjustment insomnia: Secondary | ICD-10-CM | POA: Diagnosis not present

## 2019-12-02 DIAGNOSIS — Z23 Encounter for immunization: Secondary | ICD-10-CM

## 2019-12-02 DIAGNOSIS — I10 Essential (primary) hypertension: Secondary | ICD-10-CM | POA: Diagnosis not present

## 2019-12-02 DIAGNOSIS — F329 Major depressive disorder, single episode, unspecified: Secondary | ICD-10-CM

## 2019-12-02 DIAGNOSIS — E7800 Pure hypercholesterolemia, unspecified: Secondary | ICD-10-CM

## 2019-12-02 MED ORDER — METFORMIN HCL 1000 MG PO TABS: 1000.0000 mg | ORAL_TABLET | Freq: Two times a day (BID) | ORAL | 1 refills | Status: DC

## 2019-12-02 MED ORDER — HYDROXYZINE HCL 10 MG PO TABS: 10.0000 mg | ORAL_TABLET | Freq: Every evening | ORAL | 1 refills | Status: DC | PRN

## 2019-12-02 MED ORDER — LOVASTATIN 40 MG PO TABS: 40.0000 mg | ORAL_TABLET | Freq: Every day | ORAL | 1 refills | Status: DC

## 2019-12-02 MED ORDER — SERTRALINE HCL 25 MG PO TABS: 25.0000 mg | ORAL_TABLET | Freq: Every day | ORAL | 1 refills | Status: DC

## 2019-12-02 MED ORDER — GLIPIZIDE ER 5 MG PO TB24: 5.0000 mg | ORAL_TABLET | Freq: Every day | ORAL | 1 refills | Status: DC

## 2019-12-02 MED ORDER — LOSARTAN POTASSIUM 25 MG PO TABS: 25.0000 mg | ORAL_TABLET | Freq: Every day | ORAL | 1 refills | Status: DC

## 2019-12-02 NOTE — Progress Notes (Signed)
Date:  12/02/2019   Name:  Cristina Morgan   DOB:  Jun 25, 1934   MRN:  [id number]   Chief Complaint: Diabetes, Hyperlipidemia, Hypertension, Depression, Knee Pain (refill meloxicam), Insomnia (hydroxyzine refill), and Flu Vaccine  Diabetes She presents for her follow-up diabetic visit. She has type 2 diabetes mellitus. Her disease course has been stable. Pertinent negatives for hypoglycemia include no dizziness, headaches, nervousness/anxiousness or sweats. Pertinent negatives for diabetes include no blurred vision, no chest pain, no fatigue, no foot paresthesias, no foot ulcerations, no polydipsia, no polyphagia, no polyuria, no visual change, no weakness and no weight loss. There are no hypoglycemic complications. Symptoms are stable. There are no diabetic complications. Risk factors for coronary artery disease include dyslipidemia, diabetes mellitus and hypertension. Current diabetic treatment includes oral agent (dual therapy). She is compliant with treatment all of the time. She is following a generally healthy diet. She participates in exercise weekly. Her breakfast blood glucose is taken between 8-9 am. Her breakfast blood glucose range is generally 110-130 mg/dl. An ACE inhibitor/angiotensin II receptor blocker is being taken.  Hyperlipidemia This is a chronic problem. The current episode started more than 1 year ago. The problem is controlled. Recent lipid tests were reviewed and are normal. Exacerbating diseases include diabetes. She has no history of chronic renal disease. Pertinent negatives include no chest pain, myalgias or shortness of breath. Current antihyperlipidemic treatment includes statins (omega 3). The current treatment provides moderate improvement of lipids. There are no compliance problems.  Risk factors for coronary artery disease include hypertension, diabetes mellitus, female sex, dyslipidemia and post-menopausal.  Hypertension This is a chronic problem. The current episode  started more than 1 year ago. The problem has been gradually improving since onset. The problem is controlled. Associated symptoms include anxiety. Pertinent negatives include no blurred vision, chest pain, headaches, malaise/fatigue, neck pain, orthopnea, palpitations, peripheral edema, PND, shortness of breath or sweats. There are no associated agents to hypertension. Risk factors for coronary artery disease include dyslipidemia. Past treatments include angiotensin blockers. The current treatment provides moderate improvement. There is no history of chronic renal disease.  Depression        This is a chronic problem.  The current episode started more than 1 year ago.   Associated symptoms include insomnia.  Associated symptoms include no decreased concentration, no fatigue, no helplessness, no hopelessness, not irritable, no restlessness, no decreased interest, no appetite change, no body aches, no myalgias, no headaches, no indigestion, not sad and no suicidal ideas.     The symptoms are aggravated by nothing.  Past medical history includes anxiety.   Knee Pain  The pain is present in the right knee and left knee. The pain has been intermittent since onset. Pertinent negatives include no inability to bear weight or numbness. She has tried NSAIDs for the symptoms.  Insomnia Primary symptoms: no malaise/fatigue.  The problem occurs intermittently. The problem has been gradually improving since onset. Past treatments include medication (hydroxyzine). PMH includes: depression.    Lab Results  Component Value Date   CREATININE 0.70 01/25/2019   BUN 18 01/25/2019   NA 138 01/25/2019   K 4.4 01/25/2019   CL 102 01/25/2019   CO2 24 01/25/2019   Lab Results  Component Value Date   CHOL 164 03/01/2018   HDL 61 03/01/2018   LDLCALC 90 03/01/2018   TRIG 67 03/01/2018   CHOLHDL 2.7 03/01/2018   No results found for: TSH Lab Results  Component Value Date  HGBA1C 6.7 (H) 01/25/2019   No results  found for: WBC, HGB, HCT, MCV, PLT Lab Results  Component Value Date   ALT 12 01/25/2019   AST 21 01/25/2019   ALKPHOS 74 01/25/2019   BILITOT 0.5 01/25/2019     Review of Systems  Constitutional: Negative.  Negative for appetite change, chills, fatigue, fever, malaise/fatigue, unexpected weight change and weight loss.  HENT: Negative for congestion, ear discharge, ear pain, rhinorrhea, sinus pressure, sneezing and sore throat.   Eyes: Negative for blurred vision, photophobia, pain, discharge, redness and itching.  Respiratory: Negative for cough, shortness of breath, wheezing and stridor.   Cardiovascular: Negative for chest pain, palpitations, orthopnea and PND.  Gastrointestinal: Negative for abdominal pain, blood in stool, constipation, diarrhea, nausea and vomiting.  Endocrine: Negative for cold intolerance, heat intolerance, polydipsia, polyphagia and polyuria.  Genitourinary: Negative for dysuria, flank pain, frequency, hematuria, menstrual problem, pelvic pain, urgency, vaginal bleeding and vaginal discharge.  Musculoskeletal: Negative for arthralgias, back pain, myalgias and neck pain.  Skin: Negative for rash.  Allergic/Immunologic: Negative for environmental allergies and food allergies.  Neurological: Negative for dizziness, weakness, light-headedness, numbness and headaches.  Hematological: Negative for adenopathy. Does not bruise/bleed easily.  Psychiatric/Behavioral: Positive for depression. Negative for decreased concentration, dysphoric mood and suicidal ideas. The patient has insomnia. The patient is not nervous/anxious.     Patient Active Problem List   Diagnosis Date Noted  . Need for vaccination against Streptococcus pneumoniae using pneumococcal conjugate vaccine 13 01/07/2017  . Primary osteoarthritis of right knee 12/08/2016  . PNA (pneumonia) 07/09/2015  . Acute chest pain 03/20/2014  . Type 2 diabetes mellitus ([place]) 03/20/2014  . Hypercholesterolemia  03/20/2014  . Essential hypertension 03/20/2014    Allergies  Allergen Reactions  . Penicillins Other (See Comments)    Past Surgical History:  Procedure Laterality Date  . CATARACT EXTRACTION Bilateral 2014  . COLONOSCOPY  2011   cleared for 5 yrs- [facility]  . ECTOPIC PREGNANCY SURGERY      Social History   Tobacco Use  . Smoking status: Never Smoker  . Smokeless tobacco: Never Used  . Tobacco comment: smoking cessation materials not required  Vaping Use  . Vaping Use: Never used  Substance Use Topics  . Alcohol use: No    Alcohol/week: 0.0 standard drinks  . Drug use: No     Medication list has been reviewed and updated.  Current Meds  Medication Sig  . aspirin 81 MG chewable tablet Chew 1 tablet (81 mg total) by mouth daily.  Marland Kitchen glipiZIDE (GLUCOTROL XL) 5 MG 24 hr tablet Take 1 tablet (5 mg total) by mouth daily.  . hydrOXYzine (ATARAX/VISTARIL) 10 MG tablet Take 1 tablet (10 mg total) by mouth at bedtime as needed.  Marland Kitchen losartan (COZAAR) 25 MG tablet Take 1 tablet by mouth once daily  . lovastatin (MEVACOR) 40 MG tablet Take 1 tablet by mouth once daily  . meloxicam (MOBIC) 15 MG tablet Take 1 tablet by mouth once daily  . metFORMIN (GLUCOPHAGE) 1000 MG tablet Take 1 tablet (1,000 mg total) by mouth 2 (two) times daily.  . Multiple Vitamin (MULTIVITAMIN ADULT PO) Take by mouth.  . Omega-3 Fatty Acids (FISH OIL) 1000 MG CAPS Take 1 capsule (1,000 mg total) by mouth daily.  . sertraline (ZOLOFT) 25 MG tablet Take 1 tablet (25 mg total) by mouth daily.    PHQ 2/9 Scores 12/02/2019 08/03/2019 04/19/2019 01/25/2019  PHQ - 2 Score 0 0 0 0  PHQ-  9 Score 2 - 3 2    GAD 7 : Generalized Anxiety Score 12/02/2019 04/19/2019 01/25/2019 07/06/2018  Nervous, Anxious, on Edge 0 0 0 0  Control/stop worrying 0 0 0 0  Worry too much - different things 0 0 0 2  Trouble relaxing 0 0 0 0  Restless 0 0 0 0  Easily annoyed or irritable 0 0 0 0  Afraid - awful might happen 0 0 1 0  Total GAD  7 Score 0 0 1 2  Anxiety Difficulty - Not difficult at all Not difficult at all Not difficult at all    BP Readings from Last 3 Encounters:  12/02/19 130/80  04/19/19 130/60  01/25/19 130/62    Physical Exam Vitals and nursing note reviewed.  Constitutional:      General: She is not irritable.    Appearance: She is well-developed.  HENT:     Head: Normocephalic.     Right Ear: External ear normal. There is impacted cerumen.     Left Ear: Tympanic membrane and external ear normal.     Nose: Nose normal.     Mouth/Throat:     Mouth: Mucous membranes are moist.  Eyes:     General: Lids are everted, no foreign bodies appreciated. No scleral icterus.       Left eye: No foreign body or hordeolum.     Conjunctiva/sclera: Conjunctivae normal.     Right eye: Right conjunctiva is not injected.     Left eye: Left conjunctiva is not injected.     Pupils: Pupils are equal, round, and reactive to light.  Neck:     Thyroid: No thyromegaly.     Vascular: No JVD.     Trachea: No tracheal deviation.  Cardiovascular:     Rate and Rhythm: Normal rate and regular rhythm.     Pulses:          Dorsalis pedis pulses are 2+ on the right side and 2+ on the left side.       Posterior tibial pulses are 2+ on the right side and 2+ on the left side.     Heart sounds: Normal heart sounds, S1 normal and S2 normal. No murmur heard.  No systolic murmur is present.  No diastolic murmur is present.  No friction rub. No gallop. No S3 or S4 sounds.   Pulmonary:     Effort: Pulmonary effort is normal. No respiratory distress.     Breath sounds: Normal breath sounds. No wheezing, rhonchi or rales.  Abdominal:     General: Bowel sounds are normal.     Palpations: Abdomen is soft. There is no mass.     Tenderness: There is no abdominal tenderness. There is no guarding or rebound.  Musculoskeletal:        General: No tenderness. Normal range of motion.     Cervical back: Normal range of motion and neck  supple.     Right lower leg: No edema.     Left lower leg: No edema.     Right foot: Normal range of motion. No deformity.     Left foot: Normal range of motion. No deformity or Charcot foot.  Feet:     Right foot:     Skin integrity: Dry skin present. No ulcer, blister, skin breakdown, erythema, warmth or callus.     Toenail Condition: Right toenails are normal.     Left foot:     Skin integrity: Dry skin present. No  ulcer, blister, skin breakdown, erythema, warmth or callus.     Toenail Condition: Left toenails are normal.  Lymphadenopathy:     Cervical: No cervical adenopathy.  Skin:    General: Skin is warm.     Findings: No rash.  Neurological:     Mental Status: She is alert and oriented to person, place, and time.     Cranial Nerves: No cranial nerve deficit.     Deep Tendon Reflexes: Reflexes normal.  Psychiatric:        Mood and Affect: Mood is not anxious or depressed.     Wt Readings from Last 3 Encounters:  12/02/19 149 lb (67.6 kg)  08/03/19 145 lb (65.8 kg)  04/19/19 148 lb (67.1 kg)    BP 130/80   Pulse 72   Ht 5\' 7"  (1.702 m)   Wt 149 lb (67.6 kg)   BMI 23.34 kg/m   Assessment and Plan: 1. Type 2 diabetes mellitus without complication, without long-term current use of insulin (HCC) Chronic.  Controlled.  Stable.  Continue glipizide XL 5 mg 1 a day and Metformin 1 g twice a day.  Will check CMP for electrolytes and GFR and A1c and microalbuminuria. - Comprehensive Metabolic Panel (CMET) - HgB A1c - Microalbumin, urine - glipiZIDE (GLUCOTROL XL) 5 MG 24 hr tablet; Take 1 tablet (5 mg total) by mouth daily.  Dispense: 90 tablet; Refill: 1 - losartan (COZAAR) 25 MG tablet; Take 1 tablet (25 mg total) by mouth daily.  Dispense: 90 tablet; Refill: 1 - metFORMIN (GLUCOPHAGE) 1000 MG tablet; Take 1 tablet (1,000 mg total) by mouth 2 (two) times daily.  Dispense: 180 tablet; Refill: 1 - Lipid Panel With LDL/HDL Ratio  2. Adjustment insomnia .  Controlled.   Stable.  Continue hydroxyzine 10 mg nightly as well as continuance of sertraline 25 mg daily. - hydrOXYzine (ATARAX/VISTARIL) 10 MG tablet; Take 1 tablet (10 mg total) by mouth at bedtime as needed.  Dispense: 90 tablet; Refill: 1 - sertraline (ZOLOFT) 25 MG tablet; Take 1 tablet (25 mg total) by mouth daily.  Dispense: 90 tablet; Refill: 1  3. Essential hypertension Chronic.  Controlled.  Stable.  Blood pressure 130/80 continue losartan 25 mg once a day.  Will check CMP for GFR. - Comprehensive Metabolic Panel (CMET) - losartan (COZAAR) 25 MG tablet; Take 1 tablet (25 mg total) by mouth daily.  Dispense: 90 tablet; Refill: 1  4. Hypercholesterolemia Panic.  Controlled.  Stable.  Continue lovastatin 40 mg once a day.  Will check lipid panel. - lovastatin (MEVACOR) 40 MG tablet; Take 1 tablet (40 mg total) by mouth daily.  Dispense: 90 tablet; Refill: 1 - metFORMIN (GLUCOPHAGE) 1000 MG tablet; Take 1 tablet (1,000 mg total) by mouth 2 (two) times daily.  Dispense: 180 tablet; Refill: 1 - Lipid Panel With LDL/HDL Ratio  5. Reactive depression Chronic.  Controlled.  Stable.  PHQ 2.  We will continue sertraline 25 mg once a day. - sertraline (ZOLOFT) 25 MG tablet; Take 1 tablet (25 mg total) by mouth daily.  Dispense: 90 tablet; Refill: 1  6. Need for immunization against influenza Discussed and administered - Flu Vaccine QUAD High Dose(Fluad)

## 2019-12-03 LAB — COMPREHENSIVE METABOLIC PANEL
ALT: 11 IU/L (ref 0–32)
AST: 18 IU/L (ref 0–40)
Albumin/Globulin Ratio: 1.4 (ref 1.2–2.2)
Albumin: 4.6 g/dL (ref 3.6–4.6)
Alkaline Phosphatase: 69 IU/L (ref 44–121)
BUN/Creatinine Ratio: 21 (ref 12–28)
BUN: 15 mg/dL (ref 8–27)
Bilirubin Total: 0.6 mg/dL (ref 0.0–1.2)
CO2: 26 mmol/L (ref 20–29)
Calcium: 9.9 mg/dL (ref 8.7–10.3)
Chloride: 101 mmol/L (ref 96–106)
Creatinine, Ser: 0.72 mg/dL (ref 0.57–1.00)
GFR calc Af Amer: 90 mL/min/{1.73_m2} (ref >59)
GFR calc non Af Amer: 78 mL/min/{1.73_m2} (ref >59)
Globulin, Total: 3.2 g/dL (ref 1.5–4.5)
Glucose: 163 mg/dL — ABNORMAL HIGH (ref 65–99)
Potassium: 4.6 mmol/L (ref 3.5–5.2)
Sodium: 137 mmol/L (ref 134–144)
Total Protein: 7.8 g/dL (ref 6.0–8.5)

## 2019-12-03 LAB — HEMOGLOBIN A1C
Est. average glucose Bld gHb Est-mCnc: 174 mg/dL
Hgb A1c MFr Bld: 7.7 % — ABNORMAL HIGH (ref 4.8–5.6)

## 2019-12-03 LAB — LIPID PANEL WITH LDL/HDL RATIO
Cholesterol, Total: 207 mg/dL — ABNORMAL HIGH (ref 100–199)
HDL: 73 mg/dL (ref >39)
LDL Chol Calc (NIH): 121 mg/dL — ABNORMAL HIGH (ref 0–99)
LDL/HDL Ratio: 1.7 ratio (ref 0.0–3.2)
Triglycerides: 71 mg/dL (ref 0–149)
VLDL Cholesterol Cal: 13 mg/dL (ref 5–40)

## 2019-12-03 LAB — MICROALBUMIN, URINE: Microalbumin, Urine: 4 ug/mL

## 2019-12-05 ENCOUNTER — Other Ambulatory Visit: Payer: Self-pay

## 2019-12-05 DIAGNOSIS — E119 Type 2 diabetes mellitus without complications: Secondary | ICD-10-CM

## 2019-12-05 MED ORDER — GLIPIZIDE ER 5 MG PO TB24: 10.0000 mg | ORAL_TABLET | Freq: Every day | ORAL | 1 refills | Status: DC

## 2019-12-07 ENCOUNTER — Telehealth: Payer: Self-pay

## 2019-12-07 NOTE — Chronic Care Management (AMB) (Signed)
  Chronic Care Management   Outreach Note  12/07/2019 Name: [name] MRN: [id number] DOB: 30-Aug-1934  Cristina Morgan is a 85 y.o. year old female who is a primary care patient of Juline Patch, MD. I reached out to [name] by phone today in response to a referral sent by Ms. Ranae Plumber Servello's health plan.     An unsuccessful telephone outreach was attempted today. The patient was referred to the case management team for assistance with care management and care coordination.   Follow Up Plan: The care management team will reach out to the patient again over the next 7 days.  If patient returns call to provider office, please advise to call [facility]  at [place], [place], [place], [place] [address] Direct Dial: [phone] [email or web address]: [email or web address]

## 2019-12-12 NOTE — Chronic Care Management (AMB) (Signed)
  Chronic Care Management   Note  12/12/2019 Name: VICKEY BOAK MRN: [id number] DOB: 02/27/1935  KAYTELYN GLORE is a 85 y.o. year old female who is a primary care patient of Juline Patch, MD. I reached out to [name] by phone today in response to a referral sent by Ms. Ranae Plumber Grabe's health plan.     Ms. Halpin was given information about Chronic Care Management services today including:  1. CCM service includes personalized support from designated clinical staff supervised by her physician, including individualized plan of care and coordination with other care providers 2. 24/7 contact phone numbers for assistance for urgent and routine care needs. 3. Service will only be billed when office clinical staff spend 20 minutes or more in a month to coordinate care. 4. Only one practitioner may furnish and bill the service in a calendar month. 5. The patient may stop CCM services at any time (effective at the end of the month) by phone call to the office staff. 6. The patient will be responsible for cost sharing (co-pay) of up to 20% of the service fee (after annual deductible is met).  Patient agreed to services and verbal consent obtained.   Follow up plan: Telephone appointment with care management team member scheduled for:12/15/2019  Noreene Larsson, [place], [place], [place] [address] Direct Dial: [phone] [email or web address]

## 2019-12-15 ENCOUNTER — Telehealth: Payer: Self-pay | Admitting: Family Medicine

## 2019-12-15 ENCOUNTER — Ambulatory Visit: Payer: Medicare Other | Admitting: Pharmacist

## 2019-12-15 DIAGNOSIS — E119 Type 2 diabetes mellitus without complications: Secondary | ICD-10-CM

## 2019-12-15 DIAGNOSIS — E7800 Pure hypercholesterolemia, unspecified: Secondary | ICD-10-CM

## 2019-12-15 NOTE — Telephone Encounter (Signed)
CPA called Cristina Morgan and rescheduled to tomorrow to discuss pharmacy packaging and delivery options to assist with adherence.

## 2019-12-15 NOTE — Telephone Encounter (Signed)
Copied from [place] [phone]. Topic: General - Other >> Dec 15, 2019  3:09 PM Keene Breath wrote: Reason for CRM: Patient' daughter [name]) called to reschedule a call with the pharmacy which she said was scheduled for today at 3:30.  Did not see it on schedule.  Please call to reschedule and discuss at [phone]

## 2019-12-15 NOTE — Patient Instructions (Addendum)
Visit Information  It was a pleasure speaking with you today! Thank you for letting me be a part of your care team. Please call with any questions or concerns.  Goals Addressed            This Visit's Progress   . PharmD       CARE PLAN ENTRY (see longitudinal plan of care for additional care plan information)  Current Barriers:  . Chronic Disease Management support, education, and care coordination needs related to Hypertension, Hyperlipidemia, and Diabetes   Hypertension BP Readings from Last 3 Encounters:  12/02/19 130/80  04/19/19 130/60  01/25/19 130/62   . Pharmacist Clinical Goal(s): o Over the next 120 days, patient will work with PharmD and providers to maintain BP goal <130/80 . Current regimen:  o Losartan 25 mg daily . Interventions: . Comprehensive medication review performed, medication list updated in electronic medical record . Inter-disciplinary care team collaboration (see longitudinal plan of care)  . Patient self care activities - Over the next 120 days, patient will: o Check BP 3 times weekly document, and provide at future appointments o Ensure daily salt intake < 2300 mg/day o Resume walking regimen with goal of 30 minutes/day 5 days/week.  Hyperlipidemia Lab Results  Component Value Date/Time   LDLCALC 121 (H) 12/02/2019 09:54 AM   . Pharmacist Clinical Goal(s): o Over the next 120 days, patient will work with PharmD and providers to achieve LDL goal < 100 . Current regimen:  . Lovastatin 40 mg q pm . Omega 3 2000 mg daily . Aspirin 81 mg daily . Interventions: . Comprehensive medication review performed, medication list updated in electronic medical record . Inter-disciplinary care team collaboration (see longitudinal plan of care) o Will discuss risk/benefit of aspirin therapy with PCP given patient age >73 and reflux-like symptoms. . Patient self care activities - Over the next 120 days, patient will: o Maintain healthy diet  o Resume  walking regimen with goal of 30 minutes/day 5 days/week.    Diabetes Lab Results  Component Value Date/Time   HGBA1C 7.7 (H) 12/02/2019 09:54 AM   HGBA1C 6.7 (H) 01/25/2019 10:06 AM   . Pharmacist Clinical Goal(s): o Over the next 120 days, patient will work with PharmD and providers to achieve A1c goal <7% . Current regimen:  Marland Kitchen Glipizide xl 10 mg qd (recent increase) . Metformin 1000mg  bid . Interventions: . Comprehensive medication review performed, medication list updated in electronic medical record . Inter-disciplinary care team collaboration (see longitudinal plan of care) o Discuss checking B12 level with PCP . Patient self care activities - Over the next 120 days, patient will: o Check blood sugar once daily, document, and provide at future appointments o Contact provider with any episodes of hypoglycemia o Report for follow up A1C  Medication management . Pharmacist Clinical Goal(s): o Over the next 120 days, patient will work with PharmD and providers to achieve optimal medication adherence . Current pharmacy: [facility] . Interventions o Comprehensive medication review performed. o Utilize [facility] for medication synchronization, packaging and delivery . Patient self care activities - Over the next 120 days, patient will: o Focus on medication adherence by utilizing packaging and delivery o Take medications as prescribed o Report any questions or concerns to PharmD and/or provider(s)  Initial goal documentation        Cristina Morgan was given information about Chronic Care Management services today including:  1. CCM service includes personalized support from designated clinical staff supervised by her physician,  including individualized plan of care and coordination with other care providers 2. 24/7 contact phone numbers for assistance for urgent and routine care needs. 3. Standard insurance, coinsurance, copays and deductibles apply for chronic care  management only during months in which we provide at least 20 minutes of these services. Most insurances cover these services at 100%, however patients may be responsible for any copay, coinsurance and/or deductible if applicable. This service may help you avoid the need for more expensive face-to-face services. 4. Only one practitioner may furnish and bill the service in a calendar month. 5. The patient may stop CCM services at any time (effective at the end of the month) by phone call to the office staff.  Patient agreed to services and verbal consent obtained.   The patient verbalized understanding of instructions provided today and agreed to receive a mailed copy of patient instruction and/or educational materials. Telephone follow up appointment with pharmacy team member scheduled for:  Junita Push. Carnella Fryman PharmD, BCPS Clinical Pharmacist [phone]  DASH Eating Plan DASH stands for "Dietary Approaches to Stop Hypertension." The DASH eating plan is a healthy eating plan that has been shown to reduce high blood pressure (hypertension). It may also reduce your risk for type 2 diabetes, heart disease, and stroke. The DASH eating plan may also help with weight loss. What are tips for following this plan?  General guidelines  Avoid eating more than 2,300 mg (milligrams) of salt (sodium) a day. If you have hypertension, you may need to reduce your sodium intake to 1,500 mg a day.  Limit alcohol intake to no more than 1 drink a day for nonpregnant women and 2 drinks a day for men. One drink equals 12 oz of beer, 5 oz of wine, or 1 oz of hard liquor.  Work with your health care provider to maintain a healthy body weight or to lose weight. Ask what an ideal weight is for you.  Get at least 30 minutes of exercise that causes your heart to beat faster (aerobic exercise) most days of the week. Activities may include walking, swimming, or biking.  Work with your health care provider or diet and  nutrition specialist (dietitian) to adjust your eating plan to your individual calorie needs. Reading food labels   Check food labels for the amount of sodium per serving. Choose foods with less than 5 percent of the Daily Value of sodium. Generally, foods with less than 300 mg of sodium per serving fit into this eating plan.  To find whole grains, look for the word "whole" as the first word in the ingredient list. Shopping  Buy products labeled as "low-sodium" or "no salt added."  Buy fresh foods. Avoid canned foods and premade or frozen meals. Cooking  Avoid adding salt when cooking. Use salt-free seasonings or herbs instead of table salt or sea salt. Check with your health care provider or pharmacist before using salt substitutes.  Do not fry foods. Cook foods using healthy methods such as baking, boiling, grilling, and broiling instead.  Cook with heart-healthy oils, such as olive, canola, soybean, or sunflower oil. Meal planning  Eat a balanced diet that includes: ? 5 or more servings of fruits and vegetables each day. At each meal, try to fill half of your plate with fruits and vegetables. ? Up to 6-8 servings of whole grains each day. ? Less than 6 oz of lean meat, poultry, or fish each day. A 3-oz serving of meat is about the same size as  a deck of cards. One egg equals 1 oz. ? 2 servings of low-fat dairy each day. ? A serving of nuts, seeds, or beans 5 times each week. ? Heart-healthy fats. Healthy fats called Omega-3 fatty acids are found in foods such as flaxseeds and coldwater fish, like sardines, salmon, and mackerel.  Limit how much you eat of the following: ? Canned or prepackaged foods. ? Food that is high in trans fat, such as fried foods. ? Food that is high in saturated fat, such as fatty meat. ? Sweets, desserts, sugary drinks, and other foods with added sugar. ? Full-fat dairy products.  Do not salt foods before eating.  Try to eat at least 2 vegetarian  meals each week.  Eat more home-cooked food and less restaurant, buffet, and fast food.  When eating at a restaurant, ask that your food be prepared with less salt or no salt, if possible. What foods are recommended? The items listed may not be a complete list. Talk with your dietitian about what dietary choices are best for you. Grains Whole-grain or whole-wheat bread. Whole-grain or whole-wheat pasta. Robak rice. [place]row. Bulgur. Whole-grain and low-sodium cereals. Pita bread. Low-fat, low-sodium crackers. Whole-wheat flour tortillas. Vegetables Fresh or frozen vegetables (raw, steamed, roasted, or grilled). Low-sodium or reduced-sodium tomato and vegetable juice. Low-sodium or reduced-sodium tomato sauce and tomato paste. Low-sodium or reduced-sodium canned vegetables. Fruits All fresh, dried, or frozen fruit. Canned fruit in natural juice (without added sugar). Meat and other protein foods Skinless chicken or [place]. Ground chicken or [place]. Pork with fat trimmed off. Fish and seafood. Egg whites. Dried beans, peas, or lentils. Unsalted nuts, nut butters, and seeds. Unsalted canned beans. Lean cuts of beef with fat trimmed off. Low-sodium, lean deli meat. Dairy Low-fat (1%) or fat-free (skim) milk. Fat-free, low-fat, or reduced-fat cheeses. Nonfat, low-sodium ricotta or cottage cheese. Low-fat or nonfat yogurt. Low-fat, low-sodium cheese. Fats and oils Soft margarine without trans fats. Vegetable oil. Low-fat, reduced-fat, or light mayonnaise and salad dressings (reduced-sodium). Canola, safflower, olive, soybean, and sunflower oils. Avocado. Seasoning and other foods Herbs. Spices. Seasoning mixes without salt. Unsalted popcorn and pretzels. Fat-free sweets. What foods are not recommended? The items listed may not be a complete list. Talk with your dietitian about what dietary choices are best for you. Grains Baked goods made with fat, such as croissants, muffins, or some  breads. Dry pasta or rice meal packs. Vegetables Creamed or fried vegetables. Vegetables in a cheese sauce. Regular canned vegetables (not low-sodium or reduced-sodium). Regular canned tomato sauce and paste (not low-sodium or reduced-sodium). Regular tomato and vegetable juice (not low-sodium or reduced-sodium). Angie Fava. Olives. Fruits Canned fruit in a light or heavy syrup. Fried fruit. Fruit in cream or butter sauce. Meat and other protein foods Fatty cuts of meat. Ribs. Fried meat. Berniece Salines. Sausage. Bologna and other processed lunch meats. Salami. Fatback. Hotdogs. Bratwurst. Salted nuts and seeds. Canned beans with added salt. Canned or smoked fish. Whole eggs or egg yolks. Chicken or [place] with skin. Dairy Whole or 2% milk, cream, and half-and-half. Whole or full-fat cream cheese. Whole-fat or sweetened yogurt. Full-fat cheese. Nondairy creamers. Whipped toppings. Processed cheese and cheese spreads. Fats and oils Butter. Stick margarine. Lard. Shortening. Ghee. Bacon fat. Tropical oils, such as coconut, palm kernel, or palm oil. Seasoning and other foods Salted popcorn and pretzels. Onion salt, garlic salt, seasoned salt, table salt, and sea salt. Worcestershire sauce. Tartar sauce. Barbecue sauce. Teriyaki sauce. Soy sauce, including reduced-sodium. Steak sauce.  Canned and packaged gravies. Fish sauce. Oyster sauce. Cocktail sauce. Horseradish that you find on the shelf. Ketchup. Mustard. Meat flavorings and tenderizers. Bouillon cubes. Hot sauce and Tabasco sauce. Premade or packaged marinades. Premade or packaged taco seasonings. Relishes. Regular salad dressings. Where to find more information:  National Heart, Lung, and [place]: https://wilson-eaton.com/  American Heart Association: www.heart.org Summary  The DASH eating plan is a healthy eating plan that has been shown to reduce high blood pressure (hypertension). It may also reduce your risk for type 2 diabetes, heart disease, and  stroke.  With the DASH eating plan, you should limit salt (sodium) intake to 2,300 mg a day. If you have hypertension, you may need to reduce your sodium intake to 1,500 mg a day.  When on the DASH eating plan, aim to eat more fresh fruits and vegetables, whole grains, lean proteins, low-fat dairy, and heart-healthy fats.  Work with your health care provider or diet and nutrition specialist (dietitian) to adjust your eating plan to your individual calorie needs. This information is not intended to replace advice given to you by your health care provider. Make sure you discuss any questions you have with your health care provider. Document Revised: 02/13/2017 Document Reviewed: 02/25/2016 Elsevier Patient Education  2020 [employer].

## 2019-12-15 NOTE — Chronic Care Management (AMB) (Signed)
Chronic Care Management Pharmacy  Name: Cristina Morgan  MRN: [id number] DOB: Jan 23, 1935   Chief Complaint/ HPI  Cristina Morgan,  85 y.o. , female presents for their Initial CCM visit with the clinical pharmacist via telephone due to COVID-19 Pandemic.  PCP : Juline Patch, MD Patient Care Team: Juline Patch, MD as PCP - General (Family Medicine) Golden Hurter, MD as Consulting Physician (Obstetrics and Gynecology) Vladimir Faster, [facility] (Pharmacist)  Their chronic conditions include: Hypertension, Hyperlipidemia, Diabetes and Depression   Office Visits: 12/02/19- Dr. Ronnald Ramp - blood work, increase glipizide xl to 10 mg     Allergies  Allergen Reactions  . Penicillins Other (See Comments)    Medications: Outpatient Encounter Medications as of 12/15/2019  Medication Sig  . acetaminophen (TYLENOL) 325 MG tablet Take 650 mg by mouth every 6 (six) hours as needed.  Marland Kitchen aspirin 81 MG chewable tablet Chew 1 tablet (81 mg total) by mouth daily.  . famotidine (PEPCID) 20 MG tablet Take 20 mg by mouth daily as needed for heartburn or indigestion.  Marland Kitchen glipiZIDE (GLUCOTROL XL) 5 MG 24 hr tablet Take 2 tablets (10 mg total) by mouth daily.  . hydrOXYzine (ATARAX/VISTARIL) 10 MG tablet Take 1 tablet (10 mg total) by mouth at bedtime as needed.  Marland Kitchen losartan (COZAAR) 25 MG tablet Take 1 tablet (25 mg total) by mouth daily. (Patient taking differently: Take 25 mg by mouth every evening. )  . lovastatin (MEVACOR) 40 MG tablet Take 1 tablet (40 mg total) by mouth daily. (Patient taking differently: Take 40 mg by mouth every evening. )  . metFORMIN (GLUCOPHAGE) 1000 MG tablet Take 1 tablet (1,000 mg total) by mouth 2 (two) times daily.  . Multiple Vitamin (MULTIVITAMIN ADULT PO) Take by mouth.  . Omega-3 Fatty Acids (FISH OIL) 1000 MG CAPS Take 1 capsule (1,000 mg total) by mouth daily.  . sertraline (ZOLOFT) 25 MG tablet Take 1 tablet (25 mg total) by mouth daily.  . meloxicam (MOBIC) 15  MG tablet Take 1 tablet by mouth once daily (Patient not taking: Reported on 12/15/2019)   No facility-administered encounter medications on file as of 12/15/2019.    Wt Readings from Last 3 Encounters:  12/02/19 149 lb (67.6 kg)  08/03/19 145 lb (65.8 kg)  04/19/19 148 lb (67.1 kg)    Current Diagnosis/Assessment:    Goals Addressed            This Visit's Progress   . PharmD       CARE PLAN ENTRY (see longitudinal plan of care for additional care plan information)  Current Barriers:  . Chronic Disease Management support, education, and care coordination needs related to Hypertension, Hyperlipidemia, and Diabetes   Hypertension BP Readings from Last 3 Encounters:  12/02/19 130/80  04/19/19 130/60  01/25/19 130/62   . Pharmacist Clinical Goal(s): o Over the next 120 days, patient will work with PharmD and providers to maintain BP goal <130/80 . Current regimen:  o Losartan 25 mg daily . Interventions: . Comprehensive medication review performed, medication list updated in electronic medical record . Inter-disciplinary care team collaboration (see longitudinal plan of care)  . Patient self care activities - Over the next 120 days, patient will: o Check BP 3 times weekly document, and provide at future appointments o Ensure daily salt intake < 2300 mg/day o Resume walking regimen with goal of 30 minutes/day 5 days/week.  Hyperlipidemia Lab Results  Component Value Date/Time   LDLCALC 121 (H)  12/02/2019 09:54 AM   . Pharmacist Clinical Goal(s): o Over the next 120 days, patient will work with PharmD and providers to achieve LDL goal < 100 . Current regimen:  . Lovastatin 40 mg q pm . Omega 3 2000 mg daily . Aspirin 81 mg daily . Interventions: . Comprehensive medication review performed, medication list updated in electronic medical record . Inter-disciplinary care team collaboration (see longitudinal plan of care) o Will discuss risk/benefit of aspirin therapy  with PCP given patient age >74 and reflux-like symptoms. . Patient self care activities - Over the next 120 days, patient will: o Maintain healthy diet  o Resume walking regimen with goal of 30 minutes/day 5 days/week.    Diabetes Lab Results  Component Value Date/Time   HGBA1C 7.7 (H) 12/02/2019 09:54 AM   HGBA1C 6.7 (H) 01/25/2019 10:06 AM   . Pharmacist Clinical Goal(s): o Over the next 120 days, patient will work with PharmD and providers to achieve A1c goal <7% . Current regimen:  Marland Kitchen Glipizide xl 10 mg qd (recent increase) . Metformin 1067m bid . Interventions: . Comprehensive medication review performed, medication list updated in electronic medical record . Inter-disciplinary care team collaboration (see longitudinal plan of care) o Discuss checking B12 level with PCP . Patient self care activities - Over the next 120 days, patient will: o Check blood sugar once daily, document, and provide at future appointments o Contact provider with any episodes of hypoglycemia o Report for follow up A1C  Medication management . Pharmacist Clinical Goal(s): o Over the next 120 days, patient will work with PharmD and providers to achieve optimal medication adherence . Current pharmacy: [facility] . Interventions o Comprehensive medication review performed. o Utilize [facility] for medication synchronization, packaging and delivery . Patient self care activities - Over the next 120 days, patient will: o Focus on medication adherence by utilizing packaging and delivery o Take medications as prescribed o Report any questions or concerns to PharmD and/or provider(s)  Initial goal documentation           Diabetes   A1c goal <7%  Recent Relevant Labs: Lab Results  Component Value Date/Time   HGBA1C 7.7 (H) 12/02/2019 09:54 AM   HGBA1C 6.7 (H) 01/25/2019 10:06 AM    Last diabetic Eye exam:  Lab Results  Component Value Date/Time   HMDIABEYEEXA No Retinopathy  07/15/2017 12:00 AM   BMP Latest Ref Rng & Units 12/02/2019 01/25/2019 03/01/2018  Glucose 65 - 99 mg/dL 163(H) 136(H) 147(H)  BUN 8 - 27 mg/dL _0 Creatinine 0.57 - 1.00 mg/dL 0.72 0.70 0.71  BUN/Creat Ratio 12 - _1 Sodium 134 - 144 mmol/L 137 138 138  Potassium 3.5 - 5.2 mmol/L 4.6 4.4 3.7  Chloride 96 - 106 mmol/L 101 102 101  CO2 20 - 29 mmol/L _2 Calcium 8.7 - 10.3 mg/dL 9.9 9.7 9.5     Last diabetic Foot exam: No results found for: HMDIABFOOTEX   Checking BG: Daily, each morning before breakfast  Recent FBG Readings: 120s-130s  Patient has failed these meds in past: N/A Patient is currently uncontrolled on the following medications: .Marland KitchenGlipizide xl 10 mg qd (recent increase) . Metformin 10042mbid  We discussed: diet and exercise extensively. Patient had been drinking one - two Ensure daily  prior to last lab work.  She now understands these high sugar/carbohydrate count and no longer drinks. She is very active looking after 3 69ear old twin  grandchildren daily. She cooks daily. Last night she made a pot roast with vegetables for her family. She often eats salad. She denies any symptoms of hypoglycemia with increased glipizide dose. She will resume walking for exercise 2- 3 times weekly.  Plan Consider checking  B12 level with next lab work given Metformin therapy,  Continue current medications. Could consider changing glipizide to GLP-RA or SGLT2 in the future to decrease risk of hypoglycemia   Hypertension   BP goal is:  <130/80  Office blood pressures are  BP Readings from Last 3 Encounters:  12/02/19 130/80  04/19/19 130/60  01/25/19 130/62   Patient checks BP at home 3-5x per week  Patient home BP readings are ranging: <130/80  Patient has failed these meds in the past: lisinopril Patient is currently controlled on the following medications:   Cozaar 25 mg qd  We discussed diet and exercise extensively. Patient's husband encourages her  to keep a check on her BP as he checks his regularly. She does not add salt to food.  Plan  Continue current medications     Hyperlipidemia   LDL goal < 100  Lipid Panel     Component Value Date/Time   CHOL 207 (H) 12/02/2019 0954   TRIG 71 12/02/2019 0954   HDL 73 12/02/2019 0954   LDLCALC 121 (H) 12/02/2019 0954    Hepatic Function Latest Ref Rng & Units 12/02/2019 01/25/2019 07/06/2018  Total Protein 6.0 - 8.5 g/dL 7.8 7.4 7.1  Albumin 3.6 - 4.6 g/dL 4.6 4.5 4.3  AST 0 - 40 IU/L _0 ALT 0 - 32 IU/L _1 Alk Phosphatase 44 - 121 IU/L 69 74 56  Total Bilirubin 0.0 - 1.2 mg/dL 0.6 0.5 0.4  Bilirubin, Direct 0.00 - 0.40 mg/dL - - 0.14     The ASCVD Risk score ([place]., et al., 2013) failed to calculate for the following reasons:   The 2013 ASCVD risk score is only valid for ages 34 to 37   Patient has failed these meds in past: unavailable in chart Patient is currently uncontrolled on the following medications:  . Lovastatin 40 mg q pm . Omega 3 2000 mg daily . Aspirin 81 mg daily  We discussed:  Cholesterol slightly elevated. We reviewed the difference between HDL and LDL. Mrs. Winebarger will start walking regularly with daughter.   Plan  Recommend discontinuing aspirin for primary prevention given patient age > 42 and addition of famotidine otc by patient for reflux symptoms and  nausea/vomiting after eating. I advised patient to  discuss these symptoms with Dr. Ronnald Ramp at next appointment or call if symptoms recur or worsen. Consider rechecking lipid panel at next appointment.  Depression / Anxiety/Insomnia   PHQ9 Score:  PHQ9 SCORE ONLY 12/02/2019 08/03/2019 04/19/2019  PHQ-9 Total Score 2 0 3   GAD7 Score: GAD 7 : Generalized Anxiety Score 12/02/2019 04/19/2019 01/25/2019 07/06/2018  Nervous, Anxious, on Edge 0 0 0 0  Control/stop worrying 0 0 0 0  Worry too much - different things 0 0 0 2  Trouble relaxing 0 0 0 0  Restless 0 0 0 0  Easily annoyed or  irritable 0 0 0 0  Afraid - awful might happen 0 0 1 0  Total GAD 7 Score 0 0 1 2  Anxiety Difficulty - Not difficult at all Not difficult at all Not difficult at all    Patient has failed these meds in past: N/A Patient  is currently controlled on the following medications:  . Sertraline 25 mg qd . Hydroxyzine 10 mg qhs prn   We discussed:  Patient states she has not had trouble sleeping recently and does not take hydroxyzine every night.She is satisfied with this regimen and states her mood and restlessness have improved.  S  Plan  Continue current medications   Osteopenia ?   Last DEXA Scan: 09/24/17  T-Score femoral neck: -1.5  T-Score lumbar spine: excluded due to degenerative changes  T-Score forearm radius: -1.2  10-year probability of major osteoporotic fracture: 9.2%  10-year probability of hip fracture: 2 %  No results found for: VD25OH    Patient has failed these meds in past: N/A Patient is currently uncontrolled on the following medications:  . Patient is not taking calcium or vitamin d  We discussed:  Recommend [phone] units of vitamin D daily. Recommend 1200 mg of calcium daily from dietary and supplemental sources. Recommend weight-bearing and muscle strengthening exercises for building and maintaining bone density.   Note from Dr. Ronnald Ramp in 2019 recommending Vitd 800iu/Calcium 1263m daily. Patient does not recall when she stopped taking it but did not have a bottle with the rest of her meds. She questions if her Centrum Silver could be replacing this? I advised her that Centrum Silver only has approximately ~ 300 mg of calcium but does have adequate Vitamin D in the Womens 50+ formula.   Plan Recommend patient resume calcium supplementation 500-600 mg bid if not discontinued for medical reasons.  Health Maintenance   Patient is currently taking:  . Centrum Silver MVI daily . Famotidine 20 mg qd prn  . Acetaminophen 650 mg daily prn pain  We discussed:   Patient started taking famotidine OTC due to some reports of "when I would eat it would come back up".  She feels this was a "reflux". Reports she did not discuss with Dr.Jones at the time and symptoms have improved with dietary adjustment and occassional famotidine (less than once weekly). Encouraged her discuss with PCP at next appointment.    Plan  Continue current regimen.  Vaccines   Reviewed and discussed patient's vaccination history.    Immunization History  Administered Date(s) Administered  . Fluad Quad(high Dose 65+) 01/25/2019, 12/02/2019  . Influenza, High Dose Seasonal PF 01/07/2017, 01/25/2018  . Influenza,inj,Quad PF,6+ Mos 01/03/2015, 12/27/2015  . Moderna SARS-COVID-2 Vaccination 06/08/2019, 07/05/2019  . Pneumococcal Conjugate-13 01/07/2017  . Pneumococcal Polysaccharide-23 01/25/2019    Plan  Recommended patient receive Shingrix vaccine if not cost prohibitive.  Medication Management   [facility] Uses pill box? Yes. Daughter, CVito Backersfills box weekly and picks up meds   We discussed: Discussed benefits of medication synchronization, packaging and delivery as well as enhanced pharmacist oversight with [facility].  Will discuss with Cassandra as well as she manages patient medications.  Plan  Utilize [facility] pharmacy for medication synchronization, packaging and delivery    Follow up: 3 month phone visit  JJunita Push HKenton KingfisherPharmD, [phone]

## 2019-12-23 ENCOUNTER — Telehealth: Payer: Self-pay | Admitting: Pharmacist

## 2019-12-23 NOTE — Chronic Care Management (AMB) (Signed)
° ° °  Chronic Care Management Pharmacy Assistant   Name: Cristina Morgan  MRN: [id number] DOB: 1935-01-28  Reason for Encounter: [place]     PCP : Cristina Patch, MD  Allergies:   Allergies  Allergen Reactions   Penicillins Other (See Comments)    Medications: Outpatient Encounter Medications as of 12/23/2019  Medication Sig   acetaminophen (TYLENOL) 325 MG tablet Take 650 mg by mouth every 6 (six) hours as needed.   aspirin 81 MG chewable tablet Chew 1 tablet (81 mg total) by mouth daily.   famotidine (PEPCID) 20 MG tablet Take 20 mg by mouth daily as needed for heartburn or indigestion.   glipiZIDE (GLUCOTROL XL) 5 MG 24 hr tablet Take 2 tablets (10 mg total) by mouth daily.   hydrOXYzine (ATARAX/VISTARIL) 10 MG tablet Take 1 tablet (10 mg total) by mouth at bedtime as needed.   losartan (COZAAR) 25 MG tablet Take 1 tablet (25 mg total) by mouth daily. (Patient taking differently: Take 25 mg by mouth every evening. )   lovastatin (MEVACOR) 40 MG tablet Take 1 tablet (40 mg total) by mouth daily. (Patient taking differently: Take 40 mg by mouth every evening. )   meloxicam (MOBIC) 15 MG tablet Take 1 tablet by mouth once daily (Patient not taking: Reported on 12/15/2019)   metFORMIN (GLUCOPHAGE) 1000 MG tablet Take 1 tablet (1,000 mg total) by mouth 2 (two) times daily.   Multiple Vitamin (MULTIVITAMIN ADULT PO) Take by mouth.   Omega-3 Fatty Acids (FISH OIL) 1000 MG CAPS Take 1 capsule (1,000 mg total) by mouth daily.   sertraline (ZOLOFT) 25 MG tablet Take 1 tablet (25 mg total) by mouth daily.   No facility-administered encounter medications on file as of 12/23/2019.    Current Diagnosis: Patient Active Problem List   Diagnosis Date Noted   Need for vaccination against Streptococcus pneumoniae using pneumococcal conjugate vaccine 13 01/07/2017   Primary osteoarthritis of right knee 12/08/2016   PNA (pneumonia)  07/09/2015   Acute chest pain 03/20/2014   Type 2 diabetes mellitus ([place]) 03/20/2014   Hypercholesterolemia 03/20/2014   Essential hypertension 03/20/2014    Goals Addressed   None     12-20-19: Attempted to call patient's daughter Cristina Morgan for the second time today to coordinate care for enhanced pharmacy services, but again no answer or way to leave voicemail.  I called the second contact number on file and spoke directly to Cristina Morgan. Cristina Morgan was able to provide me the needed information for medication coordination with [facility]. Patient verbalized understanding of coordination. Made CPP aware that we are needing new prescriptions for patients medication sent to [facility].  Patient now is enrolled with [facility] enhanced pharmacy services.

## 2019-12-23 NOTE — Progress Notes (Signed)
12/23/2019: Patient received a delivery from enhanced pharmacy services [facility] on the evening of 12/22/2019. Patient explained she must have counted her Metformin incorrectly during the coordination of pharmacy services because she only has 7 tablets left. Coordinated an acute delivery with [place] for a scheduled delivery of patients Metformin 12-26-19. Patient made aware of scheduled delivery date and verbalized understanding.

## 2020-01-30 ENCOUNTER — Other Ambulatory Visit: Payer: Self-pay

## 2020-01-30 DIAGNOSIS — E119 Type 2 diabetes mellitus without complications: Secondary | ICD-10-CM

## 2020-01-30 MED ORDER — GLIPIZIDE ER 5 MG PO TB24: 10.0000 mg | ORAL_TABLET | Freq: Every day | ORAL | 0 refills | Status: DC

## 2020-02-17 DIAGNOSIS — Z23 Encounter for immunization: Secondary | ICD-10-CM | POA: Diagnosis not present

## 2020-02-29 ENCOUNTER — Telehealth: Payer: Self-pay | Admitting: Pharmacist

## 2020-02-29 NOTE — Progress Notes (Addendum)
Chronic Care Management Pharmacy Assistant   Name: Cristina Morgan  MRN: [id number] DOB: [birth year]  Reason for Encounter: Coordination of delivery of medications from enhanced pharmacy services.   Patient Questions:  1.  Have you seen any other providers since your last visit? No  2.  Any changes in your medicines or health? No    PCP : Juline Patch, MD  Allergies:   Allergies  Allergen Reactions   Penicillins Other (See Comments)    Medications: Outpatient Encounter Medications as of 02/29/2020  Medication Sig   acetaminophen (TYLENOL) 325 MG tablet Take 650 mg by mouth every 6 (six) hours as needed.   aspirin 81 MG chewable tablet Chew 1 tablet (81 mg total) by mouth daily.   famotidine (PEPCID) 20 MG tablet Take 20 mg by mouth daily as needed for heartburn or indigestion.   glipiZIDE (GLUCOTROL XL) 5 MG 24 hr tablet Take 2 tablets (10 mg total) by mouth daily.   hydrOXYzine (ATARAX/VISTARIL) 10 MG tablet Take 1 tablet (10 mg total) by mouth at bedtime as needed.   losartan (COZAAR) 25 MG tablet Take 1 tablet (25 mg total) by mouth daily. (Patient taking differently: Take 25 mg by mouth every evening. )   lovastatin (MEVACOR) 40 MG tablet Take 1 tablet (40 mg total) by mouth daily. (Patient taking differently: Take 40 mg by mouth every evening. )   meloxicam (MOBIC) 15 MG tablet Take 1 tablet by mouth once daily (Patient not taking: Reported on 12/15/2019)   metFORMIN (GLUCOPHAGE) 1000 MG tablet Take 1 tablet (1,000 mg total) by mouth 2 (two) times daily.   Multiple Vitamin (MULTIVITAMIN ADULT PO) Take by mouth.   Omega-3 Fatty Acids (FISH OIL) 1000 MG CAPS Take 1 capsule (1,000 mg total) by mouth daily.   sertraline (ZOLOFT) 25 MG tablet Take 1 tablet (25 mg total) by mouth daily.   No facility-administered encounter medications on file as of 02/29/2020.    Current Diagnosis: Patient Active Problem List   Diagnosis Date Noted   Need for vaccination against  Streptococcus pneumoniae using pneumococcal conjugate vaccine 13 01/07/2017   Primary osteoarthritis of right knee 12/08/2016   PNA (pneumonia) 07/09/2015   Acute chest pain 03/20/2014   Type 2 diabetes mellitus ([place]) 03/20/2014   Hypercholesterolemia 03/20/2014   Essential hypertension 03/20/2014   BP Readings from Last 3 Encounters:  12/02/19 130/80  04/19/19 130/60  01/25/19 130/62    Lab Results  Component Value Date   HGBA1C 7.7 (H) 12/02/2019     Reviewed chart for medication changes ahead of medication coordination call.  No OVs, Consults, or hospital visits since last care coordination call/Pharmacist visit.   Patient obtains medications through [facility]. The patient was enrolled into services on 12-21-2019. The patient has requested 90 day supply into prefilled packing.   Last delivery included: 12-22-19  Patient is due for delivery on 03-02-2020   Called patient and reviewed medications and coordinated delivery.  This delivery to include:  Glipizide Er 5mg : Take 2 tablets by mouth at breakfast.. Losartan 25mg : Take 1 tablet by mouth at evening meal. Lovastatin 30mg : Take 1 tablet by mouth at evening meal.  Metformin Take 1 tablet by mouth at breakfast and 1 tablet by mouth at evening meal.  Sertraline 25mg : Take 1 tablet by mouth at breakfast.    Patient needs refills for Glipizide Er 5mg : Take 2 tablets by mouth at breakfast. PharmD Birdena Crandall made aware of need.   Confirmed  delivery date of 03-02-20, advised patient that pharmacy will contact them the morning of delivery.  Cloretta Ned, LPN Clinical Pharmacist Assistant  [phone]   Follow-Up:  Pharmacist Review/ Requesting refill of Glipizide Er 5 mg for 90 DS.   I have reviewed the care management and care coordination activities outlined in this encounter and I am certifying that I agree with the content of this note.  Junita Push. Kenton Kingfisher PharmD, [facility] [phone]

## 2020-03-01 ENCOUNTER — Other Ambulatory Visit: Payer: Self-pay | Admitting: Family Medicine

## 2020-03-01 ENCOUNTER — Other Ambulatory Visit: Payer: Self-pay

## 2020-03-01 DIAGNOSIS — E119 Type 2 diabetes mellitus without complications: Secondary | ICD-10-CM

## 2020-03-01 DIAGNOSIS — E7800 Pure hypercholesterolemia, unspecified: Secondary | ICD-10-CM

## 2020-03-01 MED ORDER — METFORMIN HCL 1000 MG PO TABS: 1000.0000 mg | ORAL_TABLET | Freq: Two times a day (BID) | ORAL | 0 refills | Status: DC

## 2020-03-01 MED ORDER — GLIPIZIDE ER 5 MG PO TB24: 10.0000 mg | ORAL_TABLET | Freq: Every day | ORAL | 0 refills | Status: DC

## 2020-03-01 NOTE — Telephone Encounter (Signed)
Medication:  glipiZIDE (GLUCOTROL XL) 5 MG 24 hr tablet [[id number]]   Has the patient contacted their pharmacy? pharmacy call    Preferred Pharmacy (with phone number or street name):  [facility] - [place], [place] - [address]  [address], [place] [address]  Phone:  [phone] Fax:  [phone]  Agent: Please be advised that RX refills may take up to 3 business days. We ask that you follow-up with your pharmacy.

## 2020-03-08 DIAGNOSIS — E7800 Pure hypercholesterolemia, unspecified: Secondary | ICD-10-CM | POA: Diagnosis not present

## 2020-03-08 DIAGNOSIS — Z1379 Encounter for other screening for genetic and chromosomal anomalies: Secondary | ICD-10-CM | POA: Diagnosis not present

## 2020-03-08 DIAGNOSIS — E138 Other specified diabetes mellitus with unspecified complications: Secondary | ICD-10-CM | POA: Diagnosis not present

## 2020-03-08 DIAGNOSIS — I10 Essential (primary) hypertension: Secondary | ICD-10-CM | POA: Diagnosis not present

## 2020-03-08 DIAGNOSIS — Z8249 Family history of ischemic heart disease and other diseases of the circulatory system: Secondary | ICD-10-CM | POA: Diagnosis not present

## 2020-03-08 DIAGNOSIS — Z950 Presence of cardiac pacemaker: Secondary | ICD-10-CM | POA: Diagnosis not present

## 2020-03-13 ENCOUNTER — Ambulatory Visit: Payer: Medicare Other | Admitting: Pharmacist

## 2020-03-13 DIAGNOSIS — I10 Essential (primary) hypertension: Secondary | ICD-10-CM

## 2020-03-13 DIAGNOSIS — E119 Type 2 diabetes mellitus without complications: Secondary | ICD-10-CM

## 2020-03-13 NOTE — Chronic Care Management (AMB) (Signed)
Chronic Care Management Pharmacy  Name: Cristina Morgan  MRN: [id number] DOB: 1934/04/01   Chief Complaint/ HPI  Cristina Morgan,  85 y.o. , female presents for their Follow-Up CCM visit with the clinical pharmacist via telephone.  PCP : Juline Patch, MD Patient Care Team: Juline Patch, MD as PCP - General (Family Medicine) Golden Hurter, MD as Consulting Physician (Obstetrics and Gynecology) Vladimir Faster, [facility] (Pharmacist)  Their chronic conditions include: Hypertension, Hyperlipidemia, Diabetes and Depression   Office Visits: 12/02/19- Dr. Ronnald Ramp - blood work, increase glipizide xl to 10 mg     Allergies  Allergen Reactions  . Penicillins Other (See Comments)    Medications: Outpatient Encounter Medications as of 03/13/2020  Medication Sig  . acetaminophen (TYLENOL) 325 MG tablet Take 650 mg by mouth every 6 (six) hours as needed.  Marland Kitchen aspirin 81 MG chewable tablet Chew 1 tablet (81 mg total) by mouth daily.  . famotidine (PEPCID) 20 MG tablet Take 20 mg by mouth daily as needed for heartburn or indigestion.  Marland Kitchen glipiZIDE (GLUCOTROL XL) 5 MG 24 hr tablet Take 2 tablets (10 mg total) by mouth daily.  . hydrOXYzine (ATARAX/VISTARIL) 10 MG tablet Take 1 tablet (10 mg total) by mouth at bedtime as needed.  Marland Kitchen losartan (COZAAR) 25 MG tablet Take 1 tablet (25 mg total) by mouth daily. (Patient taking differently: Take 25 mg by mouth every evening.)  . lovastatin (MEVACOR) 40 MG tablet Take 1 tablet (40 mg total) by mouth daily. (Patient taking differently: Take 40 mg by mouth every evening.)  . metFORMIN (GLUCOPHAGE) 1000 MG tablet Take 1 tablet (1,000 mg total) by mouth 2 (two) times daily.  . Multiple Vitamin (MULTIVITAMIN ADULT PO) Take by mouth.  . Omega-3 Fatty Acids (FISH OIL) 1000 MG CAPS Take 1 capsule (1,000 mg total) by mouth daily.  . sertraline (ZOLOFT) 25 MG tablet Take 1 tablet (25 mg total) by mouth daily.  . meloxicam (MOBIC) 15 MG tablet Take 1 tablet  by mouth once daily (Patient not taking: No sig reported)   No facility-administered encounter medications on file as of 03/13/2020.    Wt Readings from Last 3 Encounters:  12/02/19 149 lb (67.6 kg)  08/03/19 145 lb (65.8 kg)  04/19/19 148 lb (67.1 kg)    Current Diagnosis/Assessment:    Goals Addressed            This Visit's Progress   . Pharmacy Care Plan       CARE PLAN ENTRY (see longitudinal plan of care for additional care plan information)  Current Barriers:  . Chronic Disease Management support, education, and care coordination needs related to Hypertension, Hyperlipidemia, and Diabetes   Hypertension BP Readings from Last 3 Encounters:  12/02/19 130/80  04/19/19 130/60  01/25/19 130/62   . Pharmacist Clinical Goal(s): o Over the next 120 days, patient will work with PharmD and providers to maintain BP goal <130/80 . Current regimen:  o Losartan 25 mg daily . Interventions: . Comprehensive medication review performed, medication list updated in electronic medical record . Reviewed proper BP technique . Provided diet and exercise counseling. . Patient self care activities - Over the next 120 days, patient will: o Check BP 3 times weekly document, and provide at future appointments o Ensure daily salt intake < 2300 mg/day o Continue walking regimen with goal of 30 minutes/day 5 days/week.    Diabetes Lab Results  Component Value Date/Time   HGBA1C 7.7 (H) 12/02/2019 09:54  AM   HGBA1C 6.7 (H) 01/25/2019 10:06 AM   . Pharmacist Clinical Goal(s): o Over the next 120 days, patient will work with PharmD and providers to achieve A1c goal <7% . Current regimen:  Marland Kitchen Glipizide xl 10 mg qd (recent increase) . Metformin 1000mg  bid . Interventions: . Comprehensive medication review performed, medication list updated in electronic medical record . Inter-disciplinary care team collaboration (see longitudinal plan of care) o Discuss checking B12 level with  PCP . Patient self care activities - Over the next 120 days, patient will: o Check blood sugar once daily, document, and provide at future appointments o Contact provider with any episodes of hypoglycemia o Report for follow up A1C  Medication management . Pharmacist Clinical Goal(s): o Over the next 120 days, patient will work with PharmD and providers to maintain optimal medication adherence . Current pharmacy: [facility] . Interventions o Comprehensive medication review performed. o Utilize [facility] pharmacy for medication synchronization, packaging and delivery . Patient self care activities - Over the next 60 days, patient will: o Focus on medication adherence by utilizing packaging and delivery o Take medications as prescribed o Report any questions or concerns to PharmD and/or provider(s)  Please see past updates related to this goal by clicking on the "Past Updates" button in the selected goal            Diabetes   A1c goal <7%  Recent Relevant Labs: Lab Results  Component Value Date/Time   HGBA1C 7.7 (H) 12/02/2019 09:54 AM   HGBA1C 6.7 (H) 01/25/2019 10:06 AM    Last diabetic Eye exam:  Lab Results  Component Value Date/Time   HMDIABEYEEXA No Retinopathy 07/15/2017 12:00 AM   BMP Latest Ref Rng & Units 12/02/2019 01/25/2019 03/01/2018  Glucose 65 - 99 mg/dL 163(H) 136(H) 147(H)  BUN 8 - 27 mg/dL 15 18 10   Creatinine 0.57 - 1.00 mg/dL 0.72 0.70 0.71  BUN/Creat Ratio 12 - 28 21 26 14   Sodium 134 - 144 mmol/L 137 138 138  Potassium 3.5 - 5.2 mmol/L 4.6 4.4 3.7  Chloride 96 - 106 mmol/L 101 102 101  CO2 20 - 29 mmol/L 26 24 23   Calcium 8.7 - 10.3 mg/dL 9.9 9.7 9.5     Last diabetic Foot exam: No results found for: HMDIABFOOTEX   Checking BG: 1-2 times weekly, each morning before breakfast  Recent FBG Readings: <125   Patient has failed these meds in past: N/A Patient is currently uncontrolled on the following medications: Marland Kitchen Glipizide xl 10 mg qd   . Metformin 1000mg  bid  We discussed: diet and exercise extensively. She is very active looking after 76 year old twin grandchildren daily. She cooks daily. After she She often eats salad. We discussed signs and symptoms of hypoglycemia. Cristina Morgan and her daughter  walk in the evenings as schedules and weather permit. Next PCP appointment 03/24/19.  Plan Continue current medications. Consider checking  B12 level with next lab work given Metformin therapy. Could consider changing glipizide to GLP-RA or SGLT2 in the future to decrease risk of hypoglycemia   Hypertension   BP goal is:  <130/80  Office blood pressures are  BP Readings from Last 3 Encounters:  12/02/19 130/80  04/19/19 130/60  01/25/19 130/62   Patient checks BP at home weekly  Patient home BP readings are ranging: <130/80  Patient has failed these meds in the past: lisinopril Patient is currently controlled on the following medications:   Cozaar 25 mg qd  We  discussed diet and exercise extensively. Patient's husband encourages her to keep a check on her BP as he checks his regularly. She does not add salt to food.  Plan  Continue current medications       Depression / Anxiety/Insomnia   PHQ9 Score:  PHQ9 SCORE ONLY 12/02/2019 08/03/2019 04/19/2019  PHQ-9 Total Score 2 0 3   GAD7 Score: GAD 7 : Generalized Anxiety Score 12/02/2019 04/19/2019 01/25/2019 07/06/2018  Nervous, Anxious, on Edge 0 0 0 0  Control/stop worrying 0 0 0 0  Worry too much - different things 0 0 0 2  Trouble relaxing 0 0 0 0  Restless 0 0 0 0  Easily annoyed or irritable 0 0 0 0  Afraid - awful might happen 0 0 1 0  Total GAD 7 Score 0 0 1 2  Anxiety Difficulty - Not difficult at all Not difficult at all Not difficult at all    Patient has failed these meds in past: N/A Patient is currently controlled on the following medications:  . Sertraline 25 mg qd . Hydroxyzine 10 mg qhs prn   We discussed:  Patient states she has not had  trouble sleeping recently and does not take hydroxyzine every night.She notes improved mood and decreased anxiety with sertraline.  Plan  Continue current medications      Vaccines   Reviewed and discussed patient's vaccination history.    Immunization History  Administered Date(s) Administered  . Fluad Quad(high Dose 65+) 01/25/2019, 12/02/2019  . Influenza, High Dose Seasonal PF 01/07/2017, 01/25/2018  . Influenza,inj,Quad PF,6+ Mos 01/03/2015, 12/27/2015  . Moderna Sars-Covid-2 Vaccination 06/08/2019, 07/05/2019  . Pneumococcal Conjugate-13 01/07/2017  . Pneumococcal Polysaccharide-23 01/25/2019    Plan  Recommended patient call her insurance for copay on Shingrix. Can initiate patient assistance for vaccine if not covered    Follow up: 2 month phone visit  Mercer Pod. Tiburcio Pea PharmD, BCPS Clinical Pharmacist [facility] [phone]

## 2020-03-16 NOTE — Patient Instructions (Addendum)
Visit Information  It was a pleasure speaking with you today. Thank you for letting me be part of your clinical team. Please call with any questions or concerns.   Goals Addressed            This Visit's Progress   . Pharmacy Care Plan       CARE PLAN ENTRY (see longitudinal plan of care for additional care plan information)  Current Barriers:  . Chronic Disease Management support, education, and care coordination needs related to Hypertension, Hyperlipidemia, and Diabetes   Hypertension BP Readings from Last 3 Encounters:  12/02/19 130/80  04/19/19 130/60  01/25/19 130/62   . Pharmacist Clinical Goal(s): o Over the next 120 days, patient will work with PharmD and providers to maintain BP goal <130/80 . Current regimen:  o Losartan 25 mg daily . Interventions: . Comprehensive medication review performed, medication list updated in electronic medical record . Reviewed proper BP technique . Provided diet and exercise counseling. . Patient self care activities - Over the next 120 days, patient will: o Check BP 3 times weekly document, and provide at future appointments o Ensure daily salt intake < 2300 mg/day o Continue walking regimen with goal of 30 minutes/day 5 days/week.    Diabetes Lab Results  Component Value Date/Time   HGBA1C 7.7 (H) 12/02/2019 09:54 AM   HGBA1C 6.7 (H) 01/25/2019 10:06 AM   . Pharmacist Clinical Goal(s): o Over the next 120 days, patient will work with PharmD and providers to achieve A1c goal <7% . Current regimen:  Marland Kitchen Glipizide xl 10 mg qd (recent increase) . Metformin 1000mg  bid . Interventions: . Comprehensive medication review performed, medication list updated in electronic medical record . Inter-disciplinary care team collaboration (see longitudinal plan of care) o Discuss checking B12 level with PCP . Patient self care activities - Over the next 120 days, patient will: o Check blood sugar once daily, document, and provide at future  appointments o Contact provider with any episodes of hypoglycemia o Report for follow up A1C  Medication management . Pharmacist Clinical Goal(s): o Over the next 120 days, patient will work with PharmD and providers to maintain optimal medication adherence . Current pharmacy: [facility] . Interventions o Comprehensive medication review performed. o Utilize [facility] pharmacy for medication synchronization, packaging and delivery . Patient self care activities - Over the next 60 days, patient will: o Focus on medication adherence by utilizing packaging and delivery o Take medications as prescribed o Report any questions or concerns to PharmD and/or provider(s)  Please see past updates related to this goal by clicking on the "Past Updates" button in the selected goal         The patient verbalized understanding of instructions, educational materials, and care plan provided today and agreed to receive a mailed copy of patient instructions, educational materials, and care plan.   Telephone follow up appointment with pharmacy team member scheduled for: 2 months  Junita Push. Kenton Kingfisher PharmD, BCPS Clinical Pharmacist [phone]  Managing Your Hypertension Hypertension is commonly called high blood pressure. This is when the force of your blood pressing against the walls of your arteries is too strong. Arteries are blood vessels that carry blood from your heart throughout your body. Hypertension forces the heart to work harder to pump blood, and may cause the arteries to become narrow or stiff. Having untreated or uncontrolled hypertension can cause heart attack, stroke, kidney disease, and other problems. What are blood pressure readings? A blood pressure reading consists of  a higher number over a lower number. Ideally, your blood pressure should be below 120/80. The first ("top") number is called the systolic pressure. It is a measure of the pressure in your arteries as your heart beats. The  second ("bottom") number is called the diastolic pressure. It is a measure of the pressure in your arteries as the heart relaxes. What does my blood pressure reading mean? Blood pressure is classified into four stages. Based on your blood pressure reading, your health care provider may use the following stages to determine what type of treatment you need, if any. Systolic pressure and diastolic pressure are measured in a unit called mm Hg. Normal  Systolic pressure: below 120.  Diastolic pressure: below 80. Elevated  Systolic pressure: 120-129.  Diastolic pressure: below 80. Hypertension stage 1  Systolic pressure: 130-139.  Diastolic pressure: 80-89. Hypertension stage 2  Systolic pressure: 140 or above.  Diastolic pressure: 90 or above. What health risks are associated with hypertension? Managing your hypertension is an important responsibility. Uncontrolled hypertension can lead to:  A heart attack.  A stroke.  A weakened blood vessel (aneurysm).  Heart failure.  Kidney damage.  Eye damage.  Metabolic syndrome.  Memory and concentration problems. What changes can I make to manage my hypertension? Hypertension can be managed by making lifestyle changes and possibly by taking medicines. Your health care provider will help you make a plan to bring your blood pressure within a normal range. Eating and drinking   Eat a diet that is high in fiber and potassium, and low in salt (sodium), added sugar, and fat. An example eating plan is called the DASH (Dietary Approaches to Stop Hypertension) diet. To eat this way: ? Eat plenty of fresh fruits and vegetables. Try to fill half of your plate at each meal with fruits and vegetables. ? Eat whole grains, such as whole wheat pasta, Zandi rice, or whole grain bread. Fill about one quarter of your plate with whole grains. ? Eat low-fat diary products. ? Avoid fatty cuts of meat, processed or cured meats, and poultry with skin.  Fill about one quarter of your plate with lean proteins such as fish, chicken without skin, beans, eggs, and tofu. ? Avoid premade and processed foods. These tend to be higher in sodium, added sugar, and fat.  Reduce your daily sodium intake. Most people with hypertension should eat less than 1,500 mg of sodium a day.  Limit alcohol intake to no more than 1 drink a day for nonpregnant women and 2 drinks a day for men. One drink equals 12 oz of beer, 5 oz of wine, or 1 oz of hard liquor. Lifestyle  Work with your health care provider to maintain a healthy body weight, or to lose weight. Ask what an ideal weight is for you.  Get at least 30 minutes of exercise that causes your heart to beat faster (aerobic exercise) most days of the week. Activities may include walking, swimming, or biking.  Include exercise to strengthen your muscles (resistance exercise), such as weight lifting, as part of your weekly exercise routine. Try to do these types of exercises for 30 minutes at least 3 days a week.  Do not use any products that contain nicotine or tobacco, such as cigarettes and e-cigarettes. If you need help quitting, ask your health care provider.  Control any long-term (chronic) conditions you have, such as high cholesterol or diabetes. Monitoring  Monitor your blood pressure at home as told by your health  care provider. Your personal target blood pressure may vary depending on your medical conditions, your age, and other factors.  Have your blood pressure checked regularly, as often as told by your health care provider. Working with your health care provider  Review all the medicines you take with your health care provider because there may be side effects or interactions.  Talk with your health care provider about your diet, exercise habits, and other lifestyle factors that may be contributing to hypertension.  Visit your health care provider regularly. Your health care provider can help  you create and adjust your plan for managing hypertension. Will I need medicine to control my blood pressure? Your health care provider may prescribe medicine if lifestyle changes are not enough to get your blood pressure under control, and if:  Your systolic blood pressure is 130 or higher.  Your diastolic blood pressure is 80 or higher. Take medicines only as told by your health care provider. Follow the directions carefully. Blood pressure medicines must be taken as prescribed. The medicine does not work as well when you skip doses. Skipping doses also puts you at risk for problems. Contact a health care provider if:  You think you are having a reaction to medicines you have taken.  You have repeated (recurrent) headaches.  You feel dizzy.  You have swelling in your ankles.  You have trouble with your vision. Get help right away if:  You develop a severe headache or confusion.  You have unusual weakness or numbness, or you feel faint.  You have severe pain in your chest or abdomen.  You vomit repeatedly.  You have trouble breathing. Summary  Hypertension is when the force of blood pumping through your arteries is too strong. If this condition is not controlled, it may put you at risk for serious complications.  Your personal target blood pressure may vary depending on your medical conditions, your age, and other factors. For most people, a normal blood pressure is less than 120/80.  Hypertension is managed by lifestyle changes, medicines, or both. Lifestyle changes include weight loss, eating a healthy, low-sodium diet, exercising more, and limiting alcohol. This information is not intended to replace advice given to you by your health care provider. Make sure you discuss any questions you have with your health care provider. Document Revised: 06/25/2018 Document Reviewed: 01/30/2016 Elsevier Patient Education  [employer].

## 2020-03-19 NOTE — Progress Notes (Signed)
I, Elizabeth Sauer, MD, have reviewed all documentation for this visit. The documentation on 03/19/20 for the exam, diagnosis, procedures, and orders are all accurate and complete.

## 2020-03-23 ENCOUNTER — Ambulatory Visit: Payer: Medicare Other | Admitting: Family Medicine

## 2020-03-27 ENCOUNTER — Ambulatory Visit: Payer: Self-pay | Admitting: Pharmacist

## 2020-03-27 ENCOUNTER — Other Ambulatory Visit: Payer: Self-pay

## 2020-03-27 DIAGNOSIS — E7800 Pure hypercholesterolemia, unspecified: Secondary | ICD-10-CM

## 2020-03-27 DIAGNOSIS — E119 Type 2 diabetes mellitus without complications: Secondary | ICD-10-CM

## 2020-03-27 MED ORDER — METFORMIN HCL 1000 MG PO TABS: 1000.0000 mg | ORAL_TABLET | Freq: Two times a day (BID) | ORAL | 0 refills | Status: DC

## 2020-03-27 NOTE — Chronic Care Management (AMB) (Signed)
Chronic Care Management   Follow Up Note   03/27/2020 Name: Cristina Morgan MRN: [id number] DOB: Aug 14, 1934  Referred by: Juline Patch, MD Reason for referral : No chief complaint on file.   Cristina Morgan is a 86 y.o. year old female who is a primary care patient of Juline Patch, MD. The CCM team was consulted for assistance with chronic disease management and care coordination needs.    Review of patient status, including review of consultants reports, relevant laboratory and other test results, and collaboration with appropriate care team members and the patient's provider was performed as part of comprehensive patient evaluation and provision of chronic care management services.    SDOH (Social Determinants of Health) assessments performed: No See Care Plan activities for detailed interventions related to [facility])     Outpatient Encounter Medications as of 03/27/2020  Medication Sig  . acetaminophen (TYLENOL) 325 MG tablet Take 650 mg by mouth every 6 (six) hours as needed.  Marland Kitchen aspirin 81 MG chewable tablet Chew 1 tablet (81 mg total) by mouth daily.  . famotidine (PEPCID) 20 MG tablet Take 20 mg by mouth daily as needed for heartburn or indigestion.  Marland Kitchen glipiZIDE (GLUCOTROL XL) 5 MG 24 hr tablet Take 2 tablets (10 mg total) by mouth daily.  . hydrOXYzine (ATARAX/VISTARIL) 10 MG tablet Take 1 tablet (10 mg total) by mouth at bedtime as needed.  Marland Kitchen losartan (COZAAR) 25 MG tablet Take 1 tablet (25 mg total) by mouth daily. (Patient taking differently: Take 25 mg by mouth every evening.)  . lovastatin (MEVACOR) 40 MG tablet Take 1 tablet (40 mg total) by mouth daily. (Patient taking differently: Take 40 mg by mouth every evening.)  . meloxicam (MOBIC) 15 MG tablet Take 1 tablet by mouth once daily (Patient not taking: No sig reported)  . metFORMIN (GLUCOPHAGE) 1000 MG tablet Take 1 tablet (1,000 mg total) by mouth 2 (two) times daily.  . Multiple Vitamin (MULTIVITAMIN ADULT PO) Take by  mouth.  . Omega-3 Fatty Acids (FISH OIL) 1000 MG CAPS Take 1 capsule (1,000 mg total) by mouth daily.  . sertraline (ZOLOFT) 25 MG tablet Take 1 tablet (25 mg total) by mouth daily.   No facility-administered encounter medications on file as of 03/27/2020.     Reviewed chart for medication changes ahead of medication coordination call.  No OVs, Consults, or hospital visits since last care coordination call/Pharmacist visit.  No medication changes indicated.  BP Readings from Last 3 Encounters:  12/02/19 130/80  04/19/19 130/60  01/25/19 130/62    Lab Results  Component Value Date   HGBA1C 7.7 (H) 12/02/2019     Patient obtains medications through Adherence Packaging  90 Days   Last adherence delivery included: Glipizide Er 5mg : Take 2 tablets by mouth at breakfast.. Losartan 25mg : Take 1 tablet by mouth at evening meal. Lovastatin 30mg : Take 1 tablet by mouth at evening meal.  Metformin Take 1 tablet by mouth at breakfast and 1 tablet by mouth at evening meal.  Sertraline 25mg : Take 1 tablet by mouth at breakfast.   Patient did not decline any medications last month.   Patient is due for next adherence delivery on: 03/27/2020. Called patient and reviewed medications and coordinated delivery.  This delivery to include: Metformin 1000mg  twice daily (VIALS)  The patient has enough medication to get her to next week, will set up the delivery for this coming Friday 03/31/19.  Patient declined the following medications due to receiving 90  days supply on 02/29/20. Glipizide Er 5mg : Take 2 tablets by mouth at breakfast.. Losartan 25mg : Take 1 tablet by mouth at evening meal. Lovastatin 30mg : Take 1 tablet by mouth at evening meal.  Sertraline 25mg : Take 1 tablet by mouth at breakfast  Patient needs refills for Metformin to be sent to [facility] pharmacy for upcoming delivery of 60 days supply.  A 60 days supply this month with synchronize the metformin to other 4 medications so  patient can start adherence packaging in March.  Confirmed delivery date of 03/30/2020, advised patient that pharmacy will contact them today to discuss delivery time and any other details.  Beverly Milch, PharmD Clinical Pharmacist [place] [phone]

## 2020-04-05 ENCOUNTER — Ambulatory Visit: Payer: Medicare Other | Admitting: Family Medicine

## 2020-04-09 ENCOUNTER — Ambulatory Visit (INDEPENDENT_AMBULATORY_CARE_PROVIDER_SITE_OTHER): Payer: Medicare Other | Admitting: Family Medicine

## 2020-04-09 ENCOUNTER — Other Ambulatory Visit: Payer: Self-pay

## 2020-04-09 ENCOUNTER — Encounter: Payer: Self-pay | Admitting: Family Medicine

## 2020-04-09 VITALS — BP 138/72 | HR 81 | Ht 67.0 in | Wt 154.0 lb

## 2020-04-09 DIAGNOSIS — E7800 Pure hypercholesterolemia, unspecified: Secondary | ICD-10-CM | POA: Diagnosis not present

## 2020-04-09 DIAGNOSIS — E119 Type 2 diabetes mellitus without complications: Secondary | ICD-10-CM

## 2020-04-09 MED ORDER — METFORMIN HCL 1000 MG PO TABS: 1000.0000 mg | ORAL_TABLET | Freq: Two times a day (BID) | ORAL | 1 refills | Status: DC

## 2020-04-09 MED ORDER — GLIPIZIDE ER 10 MG PO TB24: 10.0000 mg | ORAL_TABLET | Freq: Every day | ORAL | 1 refills | Status: DC

## 2020-04-09 NOTE — Progress Notes (Signed)
Date:  04/09/2020   Name:  Cristina Morgan   DOB:  10-17-1934   MRN:  [id number]   Chief Complaint: Diabetes  Diabetes She presents for her follow-up diabetic visit. She has type 2 diabetes mellitus. Her disease course has been stable. There are no hypoglycemic associated symptoms. Pertinent negatives for hypoglycemia include no dizziness, headaches or nervousness/anxiousness. (One episode of 60 mg%) There are no diabetic associated symptoms. Pertinent negatives for diabetes include no fatigue, no polydipsia, no polyphagia, no polyuria and no weakness. There are no hypoglycemic complications. There are no diabetic complications. Current diabetic treatment includes oral agent (dual therapy). She is compliant with treatment all of the time. She is following a generally unhealthy (sometimes "don't half eat") diet. Meal planning includes avoidance of concentrated sweets and carbohydrate counting. She participates in exercise intermittently. Her home blood glucose trend is fluctuating minimally. Her breakfast blood glucose is taken between 8-9 am. Her breakfast blood glucose range is generally 110-130 mg/dl. An ACE inhibitor/angiotensin II receptor blocker is being taken. She does not see a podiatrist.Eye exam is not current.    Lab Results  Component Value Date   CREATININE 0.72 12/02/2019   BUN 15 12/02/2019   NA 137 12/02/2019   K 4.6 12/02/2019   CL 101 12/02/2019   CO2 26 12/02/2019   Lab Results  Component Value Date   CHOL 207 (H) 12/02/2019   HDL 73 12/02/2019   LDLCALC 121 (H) 12/02/2019   TRIG 71 12/02/2019   CHOLHDL 2.7 03/01/2018   No results found for: TSH Lab Results  Component Value Date   HGBA1C 7.7 (H) 12/02/2019   No results found for: WBC, HGB, HCT, MCV, PLT Lab Results  Component Value Date   ALT 11 12/02/2019   AST 18 12/02/2019   ALKPHOS 69 12/02/2019   BILITOT 0.6 12/02/2019     Review of Systems  Constitutional: Negative.  Negative for chills,  fatigue, fever and unexpected weight change.  HENT: Negative for congestion, ear discharge, ear pain, rhinorrhea, sinus pressure, sneezing and sore throat.   Eyes: Negative for double vision, photophobia, pain, discharge, redness and itching.  Respiratory: Negative for cough, shortness of breath, wheezing and stridor.   Gastrointestinal: Negative for abdominal pain, blood in stool, constipation, diarrhea, nausea and vomiting.  Endocrine: Negative for cold intolerance, heat intolerance, polydipsia, polyphagia and polyuria.  Genitourinary: Negative for dysuria, flank pain, frequency, hematuria, menstrual problem, pelvic pain, urgency, vaginal bleeding and vaginal discharge.  Musculoskeletal: Negative for arthralgias, back pain and myalgias.  Skin: Negative for rash.  Allergic/Immunologic: Negative for environmental allergies and food allergies.  Neurological: Negative for dizziness, weakness, light-headedness, numbness and headaches.  Hematological: Negative for adenopathy. Does not bruise/bleed easily.  Psychiatric/Behavioral: Negative for dysphoric mood. The patient is not nervous/anxious.     Patient Active Problem List   Diagnosis Date Noted  . Need for vaccination against Streptococcus pneumoniae using pneumococcal conjugate vaccine 13 01/07/2017  . Primary osteoarthritis of right knee 12/08/2016  . PNA (pneumonia) 07/09/2015  . Acute chest pain 03/20/2014  . Type 2 diabetes mellitus ([place]) 03/20/2014  . Hypercholesterolemia 03/20/2014  . Essential hypertension 03/20/2014    Allergies  Allergen Reactions  . Penicillins Other (See Comments)    Past Surgical History:  Procedure Laterality Date  . CATARACT EXTRACTION Bilateral 2014  . COLONOSCOPY  2011   cleared for 5 yrs- [facility]  . ECTOPIC PREGNANCY SURGERY      Social History   Tobacco Use  .  Smoking status: Never Smoker  . Smokeless tobacco: Never Used  . Tobacco comment: smoking cessation materials not required  Vaping  Use  . Vaping Use: Never used  Substance Use Topics  . Alcohol use: No    Alcohol/week: 0.0 standard drinks  . Drug use: No     Medication list has been reviewed and updated.  Current Meds  Medication Sig  . acetaminophen (TYLENOL) 325 MG tablet Take 650 mg by mouth every 6 (six) hours as needed.  Marland Kitchen aspirin 81 MG chewable tablet Chew 1 tablet (81 mg total) by mouth daily.  . famotidine (PEPCID) 20 MG tablet Take 20 mg by mouth daily as needed for heartburn or indigestion.  Marland Kitchen glipiZIDE (GLUCOTROL XL) 5 MG 24 hr tablet Take 2 tablets (10 mg total) by mouth daily.  . hydrOXYzine (ATARAX/VISTARIL) 10 MG tablet Take 1 tablet (10 mg total) by mouth at bedtime as needed.  Marland Kitchen losartan (COZAAR) 25 MG tablet Take 1 tablet (25 mg total) by mouth daily. (Patient taking differently: Take 25 mg by mouth every evening.)  . lovastatin (MEVACOR) 40 MG tablet Take 1 tablet (40 mg total) by mouth daily. (Patient taking differently: Take 40 mg by mouth every evening.)  . meloxicam (MOBIC) 15 MG tablet Take 1 tablet by mouth once daily  . metFORMIN (GLUCOPHAGE) 1000 MG tablet Take 1 tablet (1,000 mg total) by mouth 2 (two) times daily.  . Multiple Vitamin (MULTIVITAMIN ADULT PO) Take by mouth.  . Omega-3 Fatty Acids (FISH OIL) 1000 MG CAPS Take 1 capsule (1,000 mg total) by mouth daily.  . sertraline (ZOLOFT) 25 MG tablet Take 1 tablet (25 mg total) by mouth daily.    PHQ 2/9 Scores 04/09/2020 12/02/2019 08/03/2019 04/19/2019  PHQ - 2 Score 0 0 0 0  PHQ- 9 Score 0 2 - 3    GAD 7 : Generalized Anxiety Score 04/09/2020 12/02/2019 04/19/2019 01/25/2019  Nervous, Anxious, on Edge 0 0 0 0  Control/stop worrying 0 0 0 0  Worry too much - different things 0 0 0 0  Trouble relaxing 0 0 0 0  Restless 0 0 0 0  Easily annoyed or irritable 0 0 0 0  Afraid - awful might happen 0 0 0 1  Total GAD 7 Score 0 0 0 1  Anxiety Difficulty Not difficult at all - Not difficult at all Not difficult at all    BP Readings from  Last 3 Encounters:  04/09/20 138/72  12/02/19 130/80  04/19/19 130/60    Physical Exam Vitals and nursing note reviewed.  Constitutional:      Appearance: She is well-developed and well-nourished.  HENT:     Head: Normocephalic.     Right Ear: Tympanic membrane, ear canal and external ear normal.     Left Ear: Tympanic membrane, ear canal and external ear normal.     Nose: Nose normal.     Mouth/Throat:     Mouth: Oropharynx is clear and moist. Mucous membranes are moist.  Eyes:     General: Lids are everted, no foreign bodies appreciated. No scleral icterus.       Left eye: No foreign body or hordeolum.     Extraocular Movements: EOM normal.     Conjunctiva/sclera: Conjunctivae normal.     Right eye: Right conjunctiva is not injected.     Left eye: Left conjunctiva is not injected.     Pupils: Pupils are equal, round, and reactive to light.  Neck:  Thyroid: No thyromegaly.     Vascular: No JVD.     Trachea: No tracheal deviation.  Cardiovascular:     Rate and Rhythm: Normal rate and regular rhythm.     Pulses: Intact distal pulses.     Heart sounds: Normal heart sounds. No murmur heard. No friction rub. No gallop.   Pulmonary:     Effort: Pulmonary effort is normal. No respiratory distress.     Breath sounds: Normal breath sounds. No wheezing or rales.  Abdominal:     General: Bowel sounds are normal.     Palpations: Abdomen is soft. There is no hepatosplenomegaly or mass.     Tenderness: There is no abdominal tenderness. There is no guarding or rebound.  Musculoskeletal:        General: No tenderness or edema. Normal range of motion.     Cervical back: Normal range of motion and neck supple.  Lymphadenopathy:     Cervical: No cervical adenopathy.  Skin:    General: Skin is warm.     Findings: No rash.  Neurological:     Mental Status: She is alert and oriented to person, place, and time.     Cranial Nerves: No cranial nerve deficit.     Deep Tendon Reflexes:  Strength normal. Reflexes normal.  Psychiatric:        Mood and Affect: Mood and affect normal. Mood is not anxious or depressed.     Wt Readings from Last 3 Encounters:  04/09/20 154 lb (69.9 kg)  12/02/19 149 lb (67.6 kg)  08/03/19 145 lb (65.8 kg)    BP 138/72   Pulse 81   Ht 5\' 7"  (1.702 m)   Wt 154 lb (69.9 kg)   SpO2 96%   BMI 24.12 kg/m   Assessment and Plan: 1. Type 2 diabetes mellitus without complication, without long-term current use of insulin (HCC) Chronic.  Controlled.  Stable.  Patient will continue glipizide XL 10 mg once a day XL.  Also patient will continue Metformin 1 g twice a day.  We will check an A1c to see what current level of control is and adjust accordingly. - glipiZIDE (GLUCOTROL XL) 10 MG 24 hr tablet; Take 1 tablet (10 mg total) by mouth daily with breakfast.  Dispense: 90 tablet; Refill: 1 - metFORMIN (GLUCOPHAGE) 1000 MG tablet; Take 1 tablet (1,000 mg total) by mouth 2 (two) times daily.  Dispense: 180 tablet; Refill: 1 - Hemoglobin A1c  2. Hypercholesterolemia Chronic.  Controlled.  Stable.  Diet controlled.  Patient was given lipid clinic diet sheets guidelines for control of cholesterol and triglycerides.

## 2020-04-10 LAB — HEMOGLOBIN A1C
Est. average glucose Bld gHb Est-mCnc: 146 mg/dL
Hgb A1c MFr Bld: 6.7 % — ABNORMAL HIGH (ref 4.8–5.6)

## 2020-04-20 ENCOUNTER — Telehealth: Payer: Self-pay | Admitting: Pharmacist

## 2020-04-20 NOTE — Progress Notes (Addendum)
Chronic Care Management Pharmacy Assistant   Name: RUARI DUGGAN  MRN: [id number] DOB: 06-May-1934  Reason for Encounter: Coordination of Delivery of Medications   Patient Questions:  1.  Have you seen any other providers since your last visit? Yes, saw PCP Otilio Miu, MD on 04-09-2020.   2.  Any changes in your medicines or health? No   PCP : Juline Patch, MD  Allergies:   Allergies  Allergen Reactions   Penicillins Other (See Comments)    Medications: Outpatient Encounter Medications as of 04/20/2020  Medication Sig   acetaminophen (TYLENOL) 325 MG tablet Take 650 mg by mouth every 6 (six) hours as needed.   aspirin 81 MG chewable tablet Chew 1 tablet (81 mg total) by mouth daily.   famotidine (PEPCID) 20 MG tablet Take 20 mg by mouth daily as needed for heartburn or indigestion.   glipiZIDE (GLUCOTROL XL) 10 MG 24 hr tablet Take 1 tablet (10 mg total) by mouth daily with breakfast.   hydrOXYzine (ATARAX/VISTARIL) 10 MG tablet Take 1 tablet (10 mg total) by mouth at bedtime as needed.   losartan (COZAAR) 25 MG tablet Take 1 tablet (25 mg total) by mouth daily. (Patient taking differently: Take 25 mg by mouth every evening.)   lovastatin (MEVACOR) 40 MG tablet Take 1 tablet (40 mg total) by mouth daily. (Patient taking differently: Take 40 mg by mouth every evening.)   meloxicam (MOBIC) 15 MG tablet Take 1 tablet by mouth once daily   metFORMIN (GLUCOPHAGE) 1000 MG tablet Take 1 tablet (1,000 mg total) by mouth 2 (two) times daily.   Multiple Vitamin (MULTIVITAMIN ADULT PO) Take by mouth.   Omega-3 Fatty Acids (FISH OIL) 1000 MG CAPS Take 1 capsule (1,000 mg total) by mouth daily.   sertraline (ZOLOFT) 25 MG tablet Take 1 tablet (25 mg total) by mouth daily.   No facility-administered encounter medications on file as of 04/20/2020.    Current Diagnosis: Patient Active Problem List   Diagnosis Date Noted   Need for vaccination against Streptococcus pneumoniae using  pneumococcal conjugate vaccine 13 01/07/2017   Primary osteoarthritis of right knee 12/08/2016   PNA (pneumonia) 07/09/2015   Acute chest pain 03/20/2014   Type 2 diabetes mellitus ([place]) 03/20/2014   Hypercholesterolemia 03/20/2014   Essential hypertension 03/20/2014    BP Readings from Last 3 Encounters:  04/09/20 138/72  12/02/19 130/80  04/19/19 130/60    Lab Results  Component Value Date   HGBA1C 6.7 (H) 04/09/2020    04-20-2020   Reviewed chart for medication changes ahead of medication coordination call.  Office Visit noted with Otilio Miu, MD on 04-09-20. No changes were noted.   Patient obtains medications through Adherence Packaging  90 Days   Last delivery included:  Metformin 1000 mg twice daily (vials) on 03-30-2020   Patient declined the following medications last month due to receiving 90 days supply on 02-29-2020. Losartan 25mg : Take 1 tablet by mouth at evening meal. Lovastatin 30mg : Take 1 tablet by mouth at evening meal.  Sertraline 25mg : Take 1 tablet by mouth at breakfast   This delivery to include: Glipizide Er 5mg : Take 1 tablets by mouth at breakfast. Metformin 1000 mg: Take 1 tablet by mouth twice daily.   The above the medications will be filled to synchronize her other medications so the patient can start adherence packing in March 2022. The next delivery for the other 4 medications is set for 05-29-2020.   Confirmed delivery  date of 04-26-2020, advised patient that pharmacy will contact them the morning of delivery.  Cloretta Ned, LPN Clinical Pharmacist Assistant  [phone]  Follow-Up:  Pharmacist to review.   I have reviewed the care management and care coordination activities outlined in this encounter and I am certifying that I agree with the content of this note.  Junita Push. Kenton Kingfisher PharmD, [facility] [phone]

## 2020-05-28 ENCOUNTER — Telehealth: Payer: Self-pay | Admitting: Pharmacist

## 2020-05-28 NOTE — Progress Notes (Addendum)
° ° °  Chronic Care Management Pharmacy Assistant   Name: Cristina Morgan  MRN: [id number] DOB: Jun 19, 1934  Reason for Encounter: Medication Review   Medications: Outpatient Encounter Medications as of 05/28/2020  Medication Sig   acetaminophen (TYLENOL) 325 MG tablet Take 650 mg by mouth every 6 (six) hours as needed.   aspirin 81 MG chewable tablet Chew 1 tablet (81 mg total) by mouth daily.   famotidine (PEPCID) 20 MG tablet Take 20 mg by mouth daily as needed for heartburn or indigestion.   glipiZIDE (GLUCOTROL XL) 10 MG 24 hr tablet Take 1 tablet (10 mg total) by mouth daily with breakfast.   hydrOXYzine (ATARAX/VISTARIL) 10 MG tablet Take 1 tablet (10 mg total) by mouth at bedtime as needed.   losartan (COZAAR) 25 MG tablet Take 1 tablet (25 mg total) by mouth daily. (Patient taking differently: Take 25 mg by mouth every evening.)   lovastatin (MEVACOR) 40 MG tablet Take 1 tablet (40 mg total) by mouth daily. (Patient taking differently: Take 40 mg by mouth every evening.)   meloxicam (MOBIC) 15 MG tablet Take 1 tablet by mouth once daily   metFORMIN (GLUCOPHAGE) 1000 MG tablet Take 1 tablet (1,000 mg total) by mouth 2 (two) times daily.   Multiple Vitamin (MULTIVITAMIN ADULT PO) Take by mouth.   Omega-3 Fatty Acids (FISH OIL) 1000 MG CAPS Take 1 capsule (1,000 mg total) by mouth daily.   sertraline (ZOLOFT) 25 MG tablet Take 1 tablet (25 mg total) by mouth daily.   No facility-administered encounter medications on file as of 05/28/2020.    Reviewed chart for medication changes ahead of medication coordination call.  No OVs, Consults, or hospital visits since last care coordination call/Pharmacist visit. (If appropriate, list visit date, provider name)  No medication changes indicated OR if recent visit, treatment plan here.  BP Readings from Last 3 Encounters:  04/09/20 138/72  12/02/19 130/80  04/19/19 130/60    Lab Results  Component Value Date   HGBA1C 6.7 (H) 04/09/2020      Patient obtains medications through Adherence Packaging  90 Days   Last adherence delivery included:  Glipizide Er 10 mg Take one tab every morning with breakfast Metformin 1000 mg Tab- Take one tab twice daily  Patient is due for next adherence delivery on: 06-01-20 Called patient and reviewed medications and coordinated delivery.  This delivery to include: Sertraline 25 mg Take one tab every morning Lovastatin 40 mg Take one tab every eveining Losartan 25 mg Take one tab every evening Glipizide Er 10 mg Take one tab every morning with breakfast Metformin 1000 mg Take one tab twice daily  Patient needs refills for Lovastatin 40 mg Take one tab every eveining - CPP to request  Losartan 25 mg Take one tab every evening - CPP to request   Confirmed delivery date of 06-01-20, advised patient that pharmacy will contact them the morning of delivery.  Patient spoke with CPP and confirmed delivery of medications.    Georgiana Shore ,[place] Pharmacist Assistant [phone]  I have reviewed the care management and care coordination activities outlined in this encounter and I am certifying that I agree with the content of this note.  Junita Push. Kenton Kingfisher PharmD, [facility] [phone]

## 2020-05-31 ENCOUNTER — Other Ambulatory Visit: Payer: Self-pay

## 2020-05-31 DIAGNOSIS — I10 Essential (primary) hypertension: Secondary | ICD-10-CM

## 2020-05-31 DIAGNOSIS — E7800 Pure hypercholesterolemia, unspecified: Secondary | ICD-10-CM

## 2020-05-31 DIAGNOSIS — E119 Type 2 diabetes mellitus without complications: Secondary | ICD-10-CM

## 2020-05-31 MED ORDER — LOVASTATIN 40 MG PO TABS: 40.0000 mg | ORAL_TABLET | Freq: Every day | ORAL | 1 refills | Status: DC

## 2020-05-31 MED ORDER — LOSARTAN POTASSIUM 25 MG PO TABS: 25.0000 mg | ORAL_TABLET | Freq: Every day | ORAL | 1 refills | Status: DC

## 2020-06-21 ENCOUNTER — Telehealth: Payer: Self-pay

## 2020-06-21 NOTE — Progress Notes (Signed)
    Chronic Care Management Pharmacy Assistant   Name: ADYN HOES  MRN: [id number] DOB: Feb 16, 1935  Reason for Encounter: Medication Review- Monthly Dispensing Review   Recent office visits:  No visits noted   Recent consult visits:  No visits noted   Hospital visits:  None in previous 6 months  Medications: Outpatient Encounter Medications as of 06/21/2020  Medication Sig  . acetaminophen (TYLENOL) 325 MG tablet Take 650 mg by mouth every 6 (six) hours as needed.  Marland Kitchen aspirin 81 MG chewable tablet Chew 1 tablet (81 mg total) by mouth daily.  . famotidine (PEPCID) 20 MG tablet Take 20 mg by mouth daily as needed for heartburn or indigestion.  Marland Kitchen glipiZIDE (GLUCOTROL XL) 10 MG 24 hr tablet Take 1 tablet (10 mg total) by mouth daily with breakfast.  . hydrOXYzine (ATARAX/VISTARIL) 10 MG tablet Take 1 tablet (10 mg total) by mouth at bedtime as needed.  Marland Kitchen losartan (COZAAR) 25 MG tablet Take 1 tablet (25 mg total) by mouth daily.  Marland Kitchen lovastatin (MEVACOR) 40 MG tablet Take 1 tablet (40 mg total) by mouth daily.  . meloxicam (MOBIC) 15 MG tablet Take 1 tablet by mouth once daily  . metFORMIN (GLUCOPHAGE) 1000 MG tablet Take 1 tablet (1,000 mg total) by mouth 2 (two) times daily.  . Multiple Vitamin (MULTIVITAMIN ADULT PO) Take by mouth.  . Omega-3 Fatty Acids (FISH OIL) 1000 MG CAPS Take 1 capsule (1,000 mg total) by mouth daily.  . sertraline (ZOLOFT) 25 MG tablet Take 1 tablet (25 mg total) by mouth daily.   No facility-administered encounter medications on file as of 06/21/2020.   Reviewed chart for medication changes ahead of medication coordination call.  No OVs, Consults, or hospital visits since last care coordination call/Pharmacist visit. (If appropriate, list visit date, provider name)  No medication changes indicated OR if recent visit, treatment plan here.  BP Readings from Last 3 Encounters:  04/09/20 138/72  12/02/19 130/80  04/19/19 130/60    Lab Results  Component  Value Date   HGBA1C 6.7 (H) 04/09/2020     Patient obtains medications through Adherence Packaging  90 Days   Last adherence delivery included:  Sertraline 25 mg Take one tab every morning Lovastatin 40 mg Take one tab every eveining Losartan 25 mg Take one tab every evening Glipizide Er 10 mg Take one tab every morning with breakfast Metformin 1000 mg Take one tab twice daily  Called patient and reviewed medications. Confirmed that patient would not be needing medications delivered this month. Ms. Vassey stated she was doing well and had no concerns about her medications at this time.   [place] Sports CPA, CMA

## 2020-07-24 ENCOUNTER — Ambulatory Visit (INDEPENDENT_AMBULATORY_CARE_PROVIDER_SITE_OTHER): Payer: Medicare Other | Admitting: Family Medicine

## 2020-07-24 ENCOUNTER — Encounter: Payer: Self-pay | Admitting: Family Medicine

## 2020-07-24 ENCOUNTER — Other Ambulatory Visit: Payer: Self-pay

## 2020-07-24 VITALS — BP 120/76 | HR 64 | Ht 67.0 in | Wt 149.0 lb

## 2020-07-24 DIAGNOSIS — E119 Type 2 diabetes mellitus without complications: Secondary | ICD-10-CM

## 2020-07-24 DIAGNOSIS — F5102 Adjustment insomnia: Secondary | ICD-10-CM

## 2020-07-24 DIAGNOSIS — M1711 Unilateral primary osteoarthritis, right knee: Secondary | ICD-10-CM | POA: Diagnosis not present

## 2020-07-24 DIAGNOSIS — E7800 Pure hypercholesterolemia, unspecified: Secondary | ICD-10-CM

## 2020-07-24 DIAGNOSIS — F329 Major depressive disorder, single episode, unspecified: Secondary | ICD-10-CM | POA: Diagnosis not present

## 2020-07-24 DIAGNOSIS — I10 Essential (primary) hypertension: Secondary | ICD-10-CM

## 2020-07-24 MED ORDER — SERTRALINE HCL 25 MG PO TABS: 25.0000 mg | ORAL_TABLET | Freq: Every day | ORAL | 1 refills | Status: DC

## 2020-07-24 MED ORDER — MELOXICAM 15 MG PO TABS: 1.0000 | ORAL_TABLET | Freq: Every day | ORAL | 5 refills | Status: DC

## 2020-07-24 NOTE — Progress Notes (Signed)
Date:  07/24/2020   Name:  Cristina Morgan   DOB:  04/13/1934   MRN:  [id number]   Chief Complaint: Depression (0 and 1), Diabetes, Hyperlipidemia, Hypertension, Insomnia, and Arthritis  Depression        This is a chronic problem.  The current episode started more than 1 year ago.   The onset quality is gradual.   The problem has been gradually improving since onset.  Associated symptoms include insomnia.  Associated symptoms include no decreased concentration, no fatigue, no helplessness, no hopelessness, not irritable, no restlessness, no decreased interest, no appetite change, no body aches, no myalgias, no headaches, no indigestion, not sad and no suicidal ideas.     The symptoms are aggravated by family issues.  Past treatments include SSRIs - Selective serotonin reuptake inhibitors.  Compliance with treatment is good.  Previous treatment provided moderate relief. Diabetes She presents for her follow-up diabetic visit. She has type 2 diabetes mellitus. There are no hypoglycemic associated symptoms. Pertinent negatives for hypoglycemia include no dizziness, headaches or nervousness/anxiousness. Pertinent negatives for diabetes include no blurred vision, no chest pain, no fatigue, no polydipsia, no polyphagia, no polyuria and no weakness. There are no hypoglycemic complications. There are no diabetic complications. Pertinent negatives for diabetic complications include no CVA, PVD or retinopathy. Risk factors for coronary artery disease include dyslipidemia and hypertension. Current diabetic treatment includes oral agent (dual therapy). Her weight is fluctuating minimally. She is following a generally healthy diet. Meal planning includes avoidance of concentrated sweets and carbohydrate counting. She participates in exercise intermittently. An ACE inhibitor/angiotensin II receptor blocker is being taken.  Hyperlipidemia This is a chronic problem. The current episode started more than 1 year ago.  The problem is controlled. Recent lipid tests were reviewed and are normal. She has no history of chronic renal disease. Pertinent negatives include no chest pain, myalgias or shortness of breath. Current antihyperlipidemic treatment includes statins.  Hypertension This is a chronic problem. The current episode started more than 1 year ago. The problem has been gradually improving since onset. The problem is controlled. Pertinent negatives include no blurred vision, chest pain, headaches or shortness of breath. Risk factors for coronary artery disease include dyslipidemia. Past treatments include angiotensin blockers. The current treatment provides moderate improvement. There are no compliance problems.  There is no history of angina, kidney disease, CAD/MI, CVA, heart failure, left ventricular hypertrophy, PVD or retinopathy. There is no history of chronic renal disease, a hypertension causing med or renovascular disease.  Insomnia The onset quality is gradual. The problem has been waxing and waning since onset. The treatment provided moderate relief. PMH includes: depression.  Arthritis Presents for follow-up visit. She reports no pain, joint swelling or joint warmth. The symptoms have been stable. Pertinent negatives include no diarrhea, dysuria, fatigue, fever or rash.    Lab Results  Component Value Date   CREATININE 0.72 12/02/2019   BUN 15 12/02/2019   NA 137 12/02/2019   K 4.6 12/02/2019   CL 101 12/02/2019   CO2 26 12/02/2019   Lab Results  Component Value Date   CHOL 207 (H) 12/02/2019   HDL 73 12/02/2019   LDLCALC 121 (H) 12/02/2019   TRIG 71 12/02/2019   CHOLHDL 2.7 03/01/2018   No results found for: TSH Lab Results  Component Value Date   HGBA1C 6.7 (H) 04/09/2020   No results found for: WBC, HGB, HCT, MCV, PLT Lab Results  Component Value Date   ALT 11  12/02/2019   AST 18 12/02/2019   ALKPHOS 69 12/02/2019   BILITOT 0.6 12/02/2019     Review of Systems   Constitutional: Negative.  Negative for appetite change, chills, fatigue, fever and unexpected weight change.  HENT: Negative for congestion, ear discharge, ear pain, rhinorrhea, sinus pressure, sneezing and sore throat.   Eyes: Negative for blurred vision, photophobia, pain, discharge, redness and itching.  Respiratory: Negative for cough, shortness of breath, wheezing and stridor.   Cardiovascular: Negative for chest pain.  Gastrointestinal: Negative for abdominal pain, blood in stool, constipation, diarrhea, nausea and vomiting.  Endocrine: Negative for cold intolerance, heat intolerance, polydipsia, polyphagia and polyuria.  Genitourinary: Negative for dysuria, flank pain, frequency, hematuria, menstrual problem, pelvic pain, urgency, vaginal bleeding and vaginal discharge.  Musculoskeletal: Positive for arthritis. Negative for arthralgias, back pain, joint swelling and myalgias.  Skin: Negative for rash.  Allergic/Immunologic: Negative for environmental allergies and food allergies.  Neurological: Negative for dizziness, weakness, light-headedness, numbness and headaches.  Hematological: Negative for adenopathy. Does not bruise/bleed easily.  Psychiatric/Behavioral: Positive for depression. Negative for decreased concentration, dysphoric mood and suicidal ideas. The patient has insomnia. The patient is not nervous/anxious.     Patient Active Problem List   Diagnosis Date Noted  . Need for vaccination against Streptococcus pneumoniae using pneumococcal conjugate vaccine 13 01/07/2017  . Primary osteoarthritis of right knee 12/08/2016  . PNA (pneumonia) 07/09/2015  . Acute chest pain 03/20/2014  . Type 2 diabetes mellitus ([place]) 03/20/2014  . Hypercholesterolemia 03/20/2014  . Essential hypertension 03/20/2014    Allergies  Allergen Reactions  . Penicillins Other (See Comments)    Past Surgical History:  Procedure Laterality Date  . CATARACT EXTRACTION Bilateral 2014  .  COLONOSCOPY  2011   cleared for 5 yrs- [facility]  . ECTOPIC PREGNANCY SURGERY      Social History   Tobacco Use  . Smoking status: Never Smoker  . Smokeless tobacco: Never Used  . Tobacco comment: smoking cessation materials not required  Vaping Use  . Vaping Use: Never used  Substance Use Topics  . Alcohol use: No    Alcohol/week: 0.0 standard drinks  . Drug use: No     Medication list has been reviewed and updated.  Current Meds  Medication Sig  . acetaminophen (TYLENOL) 325 MG tablet Take 650 mg by mouth every 6 (six) hours as needed.  Marland Kitchen aspirin 81 MG chewable tablet Chew 1 tablet (81 mg total) by mouth daily.  . famotidine (PEPCID) 20 MG tablet Take 20 mg by mouth daily as needed for heartburn or indigestion.  Marland Kitchen glipiZIDE (GLUCOTROL XL) 10 MG 24 hr tablet Take 1 tablet (10 mg total) by mouth daily with breakfast.  . hydrOXYzine (ATARAX/VISTARIL) 10 MG tablet Take 1 tablet (10 mg total) by mouth at bedtime as needed.  Marland Kitchen losartan (COZAAR) 25 MG tablet Take 1 tablet (25 mg total) by mouth daily.  Marland Kitchen lovastatin (MEVACOR) 40 MG tablet Take 1 tablet (40 mg total) by mouth daily.  . meloxicam (MOBIC) 15 MG tablet Take 1 tablet by mouth once daily  . metFORMIN (GLUCOPHAGE) 1000 MG tablet Take 1 tablet (1,000 mg total) by mouth 2 (two) times daily.  . Multiple Vitamin (MULTIVITAMIN ADULT PO) Take by mouth.  . Omega-3 Fatty Acids (FISH OIL) 1000 MG CAPS Take 1 capsule (1,000 mg total) by mouth daily.  . sertraline (ZOLOFT) 25 MG tablet Take 1 tablet (25 mg total) by mouth daily.    PHQ 2/9 Scores 07/24/2020  04/09/2020 12/02/2019 08/03/2019  PHQ - 2 Score 0 0 0 0  PHQ- 9 Score 0 0 2 -    GAD 7 : Generalized Anxiety Score 07/24/2020 04/09/2020 12/02/2019 04/19/2019  Nervous, Anxious, on Edge 1 0 0 0  Control/stop worrying 0 0 0 0  Worry too much - different things 0 0 0 0  Trouble relaxing 0 0 0 0  Restless 0 0 0 0  Easily annoyed or irritable 0 0 0 0  Afraid - awful might happen 0 0 0 0   Total GAD 7 Score 1 0 0 0  Anxiety Difficulty Not difficult at all Not difficult at all - Not difficult at all    BP Readings from Last 3 Encounters:  07/24/20 120/76  04/09/20 138/72  12/02/19 130/80    Physical Exam Vitals and nursing note reviewed.  Constitutional:      General: She is not irritable.She is not in acute distress.    Appearance: She is not diaphoretic.  HENT:     Head: Normocephalic and atraumatic.     Right Ear: Tympanic membrane, ear canal and external ear normal.     Left Ear: Tympanic membrane, ear canal and external ear normal.     Nose: Nose normal. No congestion or rhinorrhea.     Mouth/Throat:     Mouth: Mucous membranes are moist.  Eyes:     General:        Right eye: No discharge.        Left eye: No discharge.     Conjunctiva/sclera: Conjunctivae normal.     Pupils: Pupils are equal, round, and reactive to light.  Neck:     Thyroid: No thyromegaly.     Vascular: No JVD.  Cardiovascular:     Rate and Rhythm: Normal rate and regular rhythm.     Heart sounds: Normal heart sounds. No murmur heard. No friction rub. No gallop.   Pulmonary:     Effort: Pulmonary effort is normal.     Breath sounds: Normal breath sounds. No wheezing or rhonchi.  Abdominal:     General: Bowel sounds are normal.     Palpations: Abdomen is soft. There is no mass.     Tenderness: There is no abdominal tenderness. There is no guarding.  Musculoskeletal:        General: Normal range of motion.     Cervical back: Normal range of motion and neck supple.  Lymphadenopathy:     Cervical: No cervical adenopathy.  Skin:    General: Skin is warm and dry.  Neurological:     Mental Status: She is alert.     Deep Tendon Reflexes: Reflexes are normal and symmetric.     Wt Readings from Last 3 Encounters:  07/24/20 149 lb (67.6 kg)  04/09/20 154 lb (69.9 kg)  12/02/19 149 lb (67.6 kg)    BP 120/76   Pulse 64   Ht 5\' 7"  (1.702 m)   Wt 149 lb (67.6 kg)   BMI 23.34  kg/m   Assessment and Plan:  1. Type 2 diabetes mellitus without complication, without long-term current use of insulin (HCC) Chronic.  Controlled.  Stable.  Continue glipizide XL 10 mg and metformin 1 g twice a day. - HgB A1c - Comprehensive Metabolic Panel (CMET)  2. Adjustment insomnia Chronic.  Controlled.  Stable.  We will continue sertraline 25 mg once a day. - sertraline (ZOLOFT) 25 MG tablet; Take 1 tablet (25 mg total) by mouth daily.  Dispense: 90 tablet; Refill: 1  3. Reactive depression Chronic.  Controlled.  Stable.  PHQ is 1.  Gad score is 0.  Continue sertraline 25 mg daily. - sertraline (ZOLOFT) 25 MG tablet; Take 1 tablet (25 mg total) by mouth daily.  Dispense: 90 tablet; Refill: 1  4. Essential hypertension Chronic.  Controlled.  Stable.  Blood pressure is 120/76.  Chronic.  Controlled.  Stable.  Continue losartan 25 mg once a day. - Comprehensive Metabolic Panel (CMET)  5. Primary osteoarthritis of right knee Chronic.  Controlled.  Stable.  Patient been encouraged to use Tylenol more on a daily basis with meloxicam on a as needed episodic basis - meloxicam (MOBIC) 15 MG tablet; Take 1 tablet (15 mg total) by mouth daily.  Dispense: 30 tablet; Refill: 5  6. Hypercholesterolemia Chronic.  Controlled.  Stable.  Will check lipid panel today in the meantime we will continue lovastatin 40 mg once a day and omega-3 1000 mg daily. - Lipid Panel With LDL/HDL Ratio

## 2020-07-25 LAB — LIPID PANEL WITH LDL/HDL RATIO
Cholesterol, Total: 176 mg/dL (ref 100–199)
HDL: 74 mg/dL (ref >39)
LDL Chol Calc (NIH): 90 mg/dL (ref 0–99)
LDL/HDL Ratio: 1.2 ratio (ref 0.0–3.2)
Triglycerides: 63 mg/dL (ref 0–149)
VLDL Cholesterol Cal: 12 mg/dL (ref 5–40)

## 2020-07-25 LAB — HEMOGLOBIN A1C
Est. average glucose Bld gHb Est-mCnc: 148 mg/dL
Hgb A1c MFr Bld: 6.8 % — ABNORMAL HIGH (ref 4.8–5.6)

## 2020-07-25 LAB — COMPREHENSIVE METABOLIC PANEL
ALT: 14 IU/L (ref 0–32)
AST: 19 IU/L (ref 0–40)
Albumin/Globulin Ratio: 1.4 (ref 1.2–2.2)
Albumin: 4.4 g/dL (ref 3.6–4.6)
Alkaline Phosphatase: 68 IU/L (ref 44–121)
BUN/Creatinine Ratio: 20 (ref 12–28)
BUN: 15 mg/dL (ref 8–27)
Bilirubin Total: 0.5 mg/dL (ref 0.0–1.2)
CO2: 22 mmol/L (ref 20–29)
Calcium: 9.9 mg/dL (ref 8.7–10.3)
Chloride: 102 mmol/L (ref 96–106)
Creatinine, Ser: 0.74 mg/dL (ref 0.57–1.00)
Globulin, Total: 3.2 g/dL (ref 1.5–4.5)
Glucose: 105 mg/dL — ABNORMAL HIGH (ref 65–99)
Potassium: 4.1 mmol/L (ref 3.5–5.2)
Sodium: 139 mmol/L (ref 134–144)
Total Protein: 7.6 g/dL (ref 6.0–8.5)
eGFR: 80 mL/min/{1.73_m2} (ref >59)

## 2020-08-06 ENCOUNTER — Ambulatory Visit: Payer: Medicare Other

## 2020-08-08 ENCOUNTER — Ambulatory Visit: Payer: Medicare Other | Admitting: Family Medicine

## 2020-08-20 ENCOUNTER — Other Ambulatory Visit: Payer: Self-pay

## 2020-08-20 DIAGNOSIS — E119 Type 2 diabetes mellitus without complications: Secondary | ICD-10-CM

## 2020-08-20 MED ORDER — GLIPIZIDE ER 10 MG PO TB24: 10.0000 mg | ORAL_TABLET | Freq: Every day | ORAL | 0 refills | Status: DC

## 2020-08-20 MED ORDER — METFORMIN HCL 1000 MG PO TABS: 1000.0000 mg | ORAL_TABLET | Freq: Two times a day (BID) | ORAL | 0 refills | Status: DC

## 2020-08-21 ENCOUNTER — Telehealth: Payer: Self-pay

## 2020-08-21 NOTE — Progress Notes (Signed)
error 

## 2020-08-21 NOTE — Chronic Care Management (AMB) (Signed)
    Chronic Care Management Pharmacy Assistant   Name: Cristina Morgan  MRN: [id number] DOB: September 02, 1934    Reason for Encounter: Medication Review Medication Coordination Call     Medications: Outpatient Encounter Medications as of 08/21/2020  Medication Sig   acetaminophen (TYLENOL) 325 MG tablet Take 650 mg by mouth every 6 (six) hours as needed.   aspirin 81 MG chewable tablet Chew 1 tablet (81 mg total) by mouth daily.   famotidine (PEPCID) 20 MG tablet Take 20 mg by mouth daily as needed for heartburn or indigestion.   glipiZIDE (GLUCOTROL XL) 10 MG 24 hr tablet Take 1 tablet (10 mg total) by mouth daily with breakfast.   hydrOXYzine (ATARAX/VISTARIL) 10 MG tablet Take 1 tablet (10 mg total) by mouth at bedtime as needed.   losartan (COZAAR) 25 MG tablet Take 1 tablet (25 mg total) by mouth daily.   lovastatin (MEVACOR) 40 MG tablet Take 1 tablet (40 mg total) by mouth daily.   meloxicam (MOBIC) 15 MG tablet Take 1 tablet (15 mg total) by mouth daily.   metFORMIN (GLUCOPHAGE) 1000 MG tablet Take 1 tablet (1,000 mg total) by mouth 2 (two) times daily.   Multiple Vitamin (MULTIVITAMIN ADULT PO) Take by mouth.   Omega-3 Fatty Acids (FISH OIL) 1000 MG CAPS Take 1 capsule (1,000 mg total) by mouth daily.   sertraline (ZOLOFT) 25 MG tablet Take 1 tablet (25 mg total) by mouth daily.   No facility-administered encounter medications on file as of 08/21/2020.    Reviewed chart for medication changes ahead of medication coordination call.  No OVs, Consults, or hospital visits since last care coordination call/Pharmacist visit. (If appropriate, list visit date, provider name)  No medication changes indicated OR if recent visit, treatment plan here.  BP Readings from Last 3 Encounters:  07/24/20 120/76  04/09/20 138/72  12/02/19 130/80    Lab Results  Component Value Date   HGBA1C 6.8 (H) 07/24/2020     Patient obtains medications through    Last adherence delivery included:  Patient did not have medications delivered to her  in the last adherence delivery.  Patient declined (meds) last month due to PRN use/additional supply on hand. Explanation of abundance on hand (ie #30 due to overlapping fills or previous adherence issues etc)  Patient is due for next adherence delivery on: 08/29/2020 Called patient and reviewed medications and coordinated delivery.  This delivery to include: Patient will need a short fill of (med), prior to adherence delivery. (To align with sync date or if PRN med)  Coordinated acute fill for (med) to be delivered (date).  Patient declined the following medications (meds) due to (reason)  Patient needs refills for   Confirmed delivery date of 08/29/2020, advised patient that pharmacy will contact them the morning of delivery.  Unable to reach patient for her Medication Coordination call.   Corrie Mckusick, [place]

## 2020-09-03 ENCOUNTER — Ambulatory Visit: Payer: Medicare Other

## 2020-09-13 ENCOUNTER — Telehealth: Payer: Self-pay | Admitting: Pharmacist

## 2020-09-13 NOTE — Chronic Care Management (AMB) (Signed)
    Chronic Care Management Pharmacy Assistant   Name: Cristina Morgan  MRN: [id number] DOB: 1934-07-31   Reason for Encounter: chart review    Medications: Outpatient Encounter Medications as of 09/13/2020  Medication Sig   acetaminophen (TYLENOL) 325 MG tablet Take 650 mg by mouth every 6 (six) hours as needed.   aspirin 81 MG chewable tablet Chew 1 tablet (81 mg total) by mouth daily.   famotidine (PEPCID) 20 MG tablet Take 20 mg by mouth daily as needed for heartburn or indigestion.   glipiZIDE (GLUCOTROL XL) 10 MG 24 hr tablet Take 1 tablet (10 mg total) by mouth daily with breakfast.   hydrOXYzine (ATARAX/VISTARIL) 10 MG tablet Take 1 tablet (10 mg total) by mouth at bedtime as needed.   losartan (COZAAR) 25 MG tablet Take 1 tablet (25 mg total) by mouth daily.   lovastatin (MEVACOR) 40 MG tablet Take 1 tablet (40 mg total) by mouth daily.   meloxicam (MOBIC) 15 MG tablet Take 1 tablet (15 mg total) by mouth daily.   metFORMIN (GLUCOPHAGE) 1000 MG tablet Take 1 tablet (1,000 mg total) by mouth 2 (two) times daily.   Multiple Vitamin (MULTIVITAMIN ADULT PO) Take by mouth.   Omega-3 Fatty Acids (FISH OIL) 1000 MG CAPS Take 1 capsule (1,000 mg total) by mouth daily.   sertraline (ZOLOFT) 25 MG tablet Take 1 tablet (25 mg total) by mouth daily.   No facility-administered encounter medications on file as of 09/13/2020.  Reviewed chart for medication changes and adherence.  No OVs, Consults, or hospital visits since last care coordination call / Pharmacist visit. No medication changes indicated  No gaps in adherence identified. Patient has follow up scheduled with pharmacy team. No further action required.   Lizbeth Bark Clinical Pharmacist Assistant [phone]

## 2020-09-19 DIAGNOSIS — Z20822 Contact with and (suspected) exposure to covid-19: Secondary | ICD-10-CM | POA: Diagnosis not present

## 2020-10-12 DIAGNOSIS — M5136 Other intervertebral disc degeneration, lumbar region: Secondary | ICD-10-CM | POA: Diagnosis not present

## 2020-10-12 DIAGNOSIS — M545 Low back pain, unspecified: Secondary | ICD-10-CM | POA: Diagnosis not present

## 2020-10-12 DIAGNOSIS — H6121 Impacted cerumen, right ear: Secondary | ICD-10-CM | POA: Diagnosis not present

## 2020-10-12 DIAGNOSIS — M4856XA Collapsed vertebra, not elsewhere classified, lumbar region, initial encounter for fracture: Secondary | ICD-10-CM | POA: Diagnosis not present

## 2020-10-12 DIAGNOSIS — M549 Dorsalgia, unspecified: Secondary | ICD-10-CM | POA: Diagnosis not present

## 2020-10-26 DIAGNOSIS — S32020A Wedge compression fracture of second lumbar vertebra, initial encounter for closed fracture: Secondary | ICD-10-CM | POA: Diagnosis not present

## 2020-10-26 DIAGNOSIS — M5136 Other intervertebral disc degeneration, lumbar region: Secondary | ICD-10-CM | POA: Diagnosis not present

## 2020-10-26 DIAGNOSIS — Y92019 Unspecified place in single-family (private) house as the place of occurrence of the external cause: Secondary | ICD-10-CM | POA: Diagnosis not present

## 2020-10-26 DIAGNOSIS — X509XXA Other and unspecified overexertion or strenuous movements or postures, initial encounter: Secondary | ICD-10-CM | POA: Diagnosis not present

## 2020-10-26 DIAGNOSIS — S32010A Wedge compression fracture of first lumbar vertebra, initial encounter for closed fracture: Secondary | ICD-10-CM | POA: Diagnosis not present

## 2020-10-28 DIAGNOSIS — S32020A Wedge compression fracture of second lumbar vertebra, initial encounter for closed fracture: Secondary | ICD-10-CM | POA: Insufficient documentation

## 2020-10-30 ENCOUNTER — Ambulatory Visit (INDEPENDENT_AMBULATORY_CARE_PROVIDER_SITE_OTHER): Payer: Medicare Other | Admitting: Family Medicine

## 2020-10-30 ENCOUNTER — Other Ambulatory Visit: Payer: Self-pay

## 2020-10-30 ENCOUNTER — Encounter: Payer: Self-pay | Admitting: Family Medicine

## 2020-10-30 VITALS — BP 152/78 | HR 74 | Ht 67.0 in | Wt 148.0 lb

## 2020-10-30 DIAGNOSIS — M545 Low back pain, unspecified: Secondary | ICD-10-CM | POA: Diagnosis not present

## 2020-10-30 DIAGNOSIS — S32000D Wedge compression fracture of unspecified lumbar vertebra, subsequent encounter for fracture with routine healing: Secondary | ICD-10-CM | POA: Diagnosis not present

## 2020-10-30 DIAGNOSIS — M8588 Other specified disorders of bone density and structure, other site: Secondary | ICD-10-CM

## 2020-10-30 MED ORDER — BACLOFEN 20 MG PO TABS: 20.0000 mg | ORAL_TABLET | Freq: Three times a day (TID) | ORAL | 1 refills | Status: DC

## 2020-10-30 NOTE — Progress Notes (Signed)
Date:  10/30/2020   Name:  Cristina Morgan   DOB:  Apr 25, 1934   MRN:  [id number]   Chief Complaint: No chief complaint on file.  Back Pain This is a new problem. The current episode started 1 to 4 weeks ago. The problem occurs constantly. The problem has been waxing and waning since onset. The pain is present in the lumbar spine. The quality of the pain is described as stabbing and shooting. The pain is at a severity of 10/10. The pain is moderate. The pain is The same all the time. The symptoms are aggravated by bending and sitting. Pertinent negatives include no bladder incontinence, bowel incontinence, numbness, paresthesias, tingling or weakness.   Lab Results  Component Value Date   CREATININE 0.74 07/24/2020   BUN 15 07/24/2020   NA 139 07/24/2020   K 4.1 07/24/2020   CL 102 07/24/2020   CO2 22 07/24/2020   Lab Results  Component Value Date   CHOL 176 07/24/2020   HDL 74 07/24/2020   LDLCALC 90 07/24/2020   TRIG 63 07/24/2020   CHOLHDL 2.7 03/01/2018   No results found for: TSH Lab Results  Component Value Date   HGBA1C 6.8 (H) 07/24/2020   No results found for: WBC, HGB, HCT, MCV, PLT Lab Results  Component Value Date   ALT 14 07/24/2020   AST 19 07/24/2020   ALKPHOS 68 07/24/2020   BILITOT 0.5 07/24/2020     Review of Systems  Gastrointestinal:  Negative for bowel incontinence.  Genitourinary:  Negative for bladder incontinence.  Musculoskeletal:  Positive for back pain.  Neurological:  Negative for tingling, weakness, numbness and paresthesias.   Patient Active Problem List   Diagnosis Date Noted   Need for vaccination against Streptococcus pneumoniae using pneumococcal conjugate vaccine 13 01/07/2017   Primary osteoarthritis of right knee 12/08/2016   PNA (pneumonia) 07/09/2015   Acute chest pain 03/20/2014   Type 2 diabetes mellitus ([place]) 03/20/2014   Hypercholesterolemia 03/20/2014   Essential hypertension 03/20/2014    Allergies  Allergen  Reactions   Penicillins Other (See Comments)    Past Surgical History:  Procedure Laterality Date   CATARACT EXTRACTION Bilateral 2014   COLONOSCOPY  2011   cleared for 5 yrs- [facility]   ECTOPIC PREGNANCY SURGERY      Social History   Tobacco Use   Smoking status: Never   Smokeless tobacco: Never   Tobacco comments:    smoking cessation materials not required  Vaping Use   Vaping Use: Never used  Substance Use Topics   Alcohol use: No    Alcohol/week: 0.0 standard drinks   Drug use: No     Medication list has been reviewed and updated.  No outpatient medications have been marked as taking for the 10/30/20 encounter (Appointment) with Juline Patch, MD.    [facility] 2/9 Scores 07/24/2020 04/09/2020 12/02/2019 08/03/2019  PHQ - 2 Score 0 0 0 0  PHQ- 9 Score 0 0 2 -    GAD 7 : Generalized Anxiety Score 07/24/2020 04/09/2020 12/02/2019 04/19/2019  Nervous, Anxious, on Edge 1 0 0 0  Control/stop worrying 0 0 0 0  Worry too much - different things 0 0 0 0  Trouble relaxing 0 0 0 0  Restless 0 0 0 0  Easily annoyed or irritable 0 0 0 0  Afraid - awful might happen 0 0 0 0  Total GAD 7 Score 1 0 0 0  Anxiety Difficulty Not difficult  at all Not difficult at all - Not difficult at all    BP Readings from Last 3 Encounters:  07/24/20 120/76  04/09/20 138/72  12/02/19 130/80    Physical Exam  Wt Readings from Last 3 Encounters:  07/24/20 149 lb (67.6 kg)  04/09/20 154 lb (69.9 kg)  12/02/19 149 lb (67.6 kg)    There were no vitals taken for this visit.  Assessment and Plan:  1. Acute low back pain without sciatica, unspecified back pain laterality This is a set telehealth uninvited visit that the patients are running to her health that for some reason other she went in after it and then when the lids return around and looked at her she ran into the wall and then ran into a second wall and hurt her back.  Upon evaluation it was noted that patient had a compression fracture and  she was treated with baclofen and nonsteroidal anti-inflammatories.  Baclofen was refilled 1 tablet every 8 hours as needed with a refill. - baclofen (LIORESAL) 20 MG tablet; Take 1 tablet (20 mg total) by mouth 3 (three) times daily.  Dispense: 30 each; Refill: 1  2. Compression fracture of lumbar vertebra with routine healing, unspecified lumbar vertebral level, subsequent encounter Is noted that she has compression fractures of L1 and possibly 2 and 3 which were not there in 2016.  They are conducting another test to see if this is of a recent nature and if this can need to be further stabilization of these compression fractures. - baclofen (LIORESAL) 20 MG tablet; Take 1 tablet (20 mg total) by mouth 3 (three) times daily.  Dispense: 30 each; Refill: 1  3. Osteopenia of lumbar spine Patient had in 2019 a DEXA scan that noted osteopenia for which she is taking vitamin D and calcium.  At this point in time we will likely repeat her DEXA scan but after she is stabilized from the pain of her lower back from the compression fractures.

## 2020-10-30 NOTE — Patient Instructions (Signed)
Osteopenia  Osteopenia is a loss of thickness (density) inside the bones. Another name for osteopenia is low bone mass. Mild osteopenia is a normal part of aging. It is not a disease, and it does notcause symptoms. However, if you have osteopenia and continue to lose bone mass, you could develop a condition that causes the bones to become thin and break more easily (osteoporosis). Osteoporosis can cause you to lose some height, have back pain, and have a stooped posture. Although osteopenia is not a disease, making changes to your lifestyle and diet can help to prevent osteopenia from developing intoosteoporosis. What are the causes? Osteopenia is caused by loss of calcium in the bones. Bones are constantly changing. Old bone cells are continually being replaced with new bone cells.This process builds new bone. The mineral calcium is needed to build new bone and maintain bone density. Bone density is usually highest around age 43. After that, most people's bodiescannot replace all the bone they have lost with new bone. What increases the risk? You are more likely to develop this condition if: You are older than age 28. You are a woman who went through menopause early. You have a long illness that keeps you in bed. You do not get enough exercise. You lack certain nutrients (malnutrition). You have an overactive thyroid gland (hyperthyroidism). You use products that contain nicotine or tobacco, such as cigarettes, e-cigarettes and chewing tobacco, or you drink a lot of alcohol. You are taking medicines that weaken the bones, such as steroids. What are the signs or symptoms? This condition does not cause any symptoms. You may have a slightly higher risk for bone breaks (fractures), so getting fractures more easily than normal may be an indication ofosteopenia. How is this diagnosed? This condition may be diagnosed based on an X-ray exam that measures bone density (dual-energy X-ray absorptiometry,  or DEXA). This test can measure bone density in your hips, spine, and wrists. Osteopenia has no symptoms, so this condition is usually diagnosed after a routine bone density screening test is done for osteoporosis. This routine screening is usually done for: Women who are age 45 or older. Men who are age 87 or older. If you have risk factors for osteopenia, you may have the screening test at anearlier age. How is this treated? Making dietary and lifestyle changes can lower your risk for osteoporosis. If you have severe osteopenia that is close to becoming osteoporosis, this condition can be treated with medicines and dietary supplements such as calciumand vitamin D. These supplements help to rebuild bone density. Follow these instructions at home: Eating and drinking Eat a diet that is high in calcium and vitamin D. Calcium is found in dairy products, beans, salmon, and leafy green vegetables like spinach and broccoli. Look for foods that have vitamin D and calcium added to them (fortified foods), such as orange juice, cereal, and bread.  Lifestyle Do 30 minutes or more of a weight-bearing exercise every day, such as walking, jogging, or playing a sport. These types of exercises strengthen the bones. Do not use any products that contain nicotine or tobacco, such as cigarettes, e-cigarettes, and chewing tobacco. If you need help quitting, ask your health care provider. Do not drink alcohol if: Your health care provider tells you not to drink. You are pregnant, may be pregnant, or are planning to become pregnant. If you drink alcohol: Limit how much you use to: 0-1 drink a day for women. 0-2 drinks a day for men. Be aware  of how much alcohol is in your drink. In the [place] one drink equals one 12 oz bottle of beer (355 mL), one 5 oz glass of wine (148 mL), or one 1 oz glass of hard liquor (44 mL). General instructions Take over-the-counter and prescription medicines only as told by your  health care provider. These include vitamins and supplements. Take precautions at home to lower your risk of falling, such as: Keeping rooms well-lit and free of clutter, such as cords. Installing safety rails on stairs. Using rubber mats in the bathroom or other areas that are often wet or slippery. Keep all follow-up visits. This is important. Contact a health care provider if: You have not had a bone density screening for osteoporosis and you are: A woman who is age 40 or older. A man who is age 36 or older. You are a postmenopausal woman who has not had a bone density screening for osteoporosis. You are older than age 82 and you want to know if you should have bone density screening for osteoporosis. Summary Osteopenia is a loss of thickness (density) inside the bones. Another name for osteopenia is low bone mass. Osteopenia is not a disease, but it may increase your risk for a condition that causes the bones to become thin and break more easily (osteoporosis). You may be at risk for osteopenia if you are older than age 18 or if you are a woman who went through early menopause. Osteopenia does not cause any symptoms, but it can be diagnosed with a bone density screening test. Dietary and lifestyle changes are the first treatment for osteopenia. These may lower your risk for osteoporosis. This information is not intended to replace advice given to you by your health care provider. Make sure you discuss any questions you have with your healthcare provider. Document Revised: 08/18/2019 Document Reviewed: 08/18/2019 Elsevier Patient Education  [place].

## 2020-11-02 DIAGNOSIS — S32020A Wedge compression fracture of second lumbar vertebra, initial encounter for closed fracture: Secondary | ICD-10-CM | POA: Diagnosis not present

## 2020-11-02 DIAGNOSIS — M48061 Spinal stenosis, lumbar region without neurogenic claudication: Secondary | ICD-10-CM | POA: Diagnosis not present

## 2020-11-02 DIAGNOSIS — M5136 Other intervertebral disc degeneration, lumbar region: Secondary | ICD-10-CM | POA: Diagnosis not present

## 2020-11-02 DIAGNOSIS — R600 Localized edema: Secondary | ICD-10-CM | POA: Diagnosis not present

## 2020-11-02 DIAGNOSIS — S32010A Wedge compression fracture of first lumbar vertebra, initial encounter for closed fracture: Secondary | ICD-10-CM | POA: Diagnosis not present

## 2020-11-07 ENCOUNTER — Ambulatory Visit (INDEPENDENT_AMBULATORY_CARE_PROVIDER_SITE_OTHER): Payer: Medicare Other

## 2020-11-07 DIAGNOSIS — Z0000 Encounter for general adult medical examination without abnormal findings: Secondary | ICD-10-CM

## 2020-11-07 NOTE — Patient Instructions (Signed)
Cristina Morgan , Thank you for taking time to come for your Medicare Wellness Visit. I appreciate your ongoing commitment to your health goals. Please review the following plan we discussed and let me know if I can assist you in the future.   Screening recommendations/referrals: Colonoscopy: no longer required Mammogram: no longer required Bone Density: no longer required Recommended yearly ophthalmology/optometry visit for glaucoma screening and checkup Recommended yearly dental visit for hygiene and checkup  Vaccinations: Influenza vaccine: done 12/02/19 Pneumococcal vaccine: done 01/07/17 Tdap vaccine: due Shingles vaccine: Shingrix discussed. Please contact your pharmacy for coverage information.  Covid-19: done 06/08/19, 07/05/19 & 01/16/20  Advanced directives: Please bring a copy of your health care power of attorney and living will to the office at your convenience.   Conditions/risks identified: Recommend physical activity as tolerated  Next appointment: Follow up in one year for your annual wellness visit    Preventive Care 65 Years and Older, Female Preventive care refers to lifestyle choices and visits with your health care provider that can promote health and wellness. What does preventive care include? A yearly physical exam. This is also called an annual well check. Dental exams once or twice a year. Routine eye exams. Ask your health care provider how often you should have your eyes checked. Personal lifestyle choices, including: Daily care of your teeth and gums. Regular physical activity. Eating a healthy diet. Avoiding tobacco and drug use. Limiting alcohol use. Practicing safe sex. Taking low-dose aspirin every day. Taking vitamin and mineral supplements as recommended by your health care provider. What happens during an annual well check? The services and screenings done by your health care provider during your annual well check will depend on your age, overall  health, lifestyle risk factors, and family history of disease. Counseling  Your health care provider may ask you questions about your: Alcohol use. Tobacco use. Drug use. Emotional well-being. Home and relationship well-being. Sexual activity. Eating habits. History of falls. Memory and ability to understand (cognition). Work and work Statistician. Reproductive health. Screening  You may have the following tests or measurements: Height, weight, and BMI. Blood pressure. Lipid and cholesterol levels. These may be checked every 5 years, or more frequently if you are over 53 years old. Skin check. Lung cancer screening. You may have this screening every year starting at age 59 if you have a 30-pack-year history of smoking and currently smoke or have quit within the past 15 years. Fecal occult blood test (FOBT) of the stool. You may have this test every year starting at age 59. Flexible sigmoidoscopy or colonoscopy. You may have a sigmoidoscopy every 5 years or a colonoscopy every 10 years starting at age 61. Hepatitis C blood test. Hepatitis B blood test. Sexually transmitted disease (STD) testing. Diabetes screening. This is done by checking your blood sugar (glucose) after you have not eaten for a while (fasting). You may have this done every 1-3 years. Bone density scan. This is done to screen for osteoporosis. You may have this done starting at age 40. Mammogram. This may be done every 1-2 years. Talk to your health care provider about how often you should have regular mammograms. Talk with your health care provider about your test results, treatment options, and if necessary, the need for more tests. Vaccines  Your health care provider may recommend certain vaccines, such as: Influenza vaccine. This is recommended every year. Tetanus, diphtheria, and acellular pertussis (Tdap, Td) vaccine. You may need a Td booster every 10 years.  Zoster vaccine. You may need this after age  32. Pneumococcal 13-valent conjugate (PCV13) vaccine. One dose is recommended after age 71. Pneumococcal polysaccharide (PPSV23) vaccine. One dose is recommended after age 57. Talk to your health care provider about which screenings and vaccines you need and how often you need them. This information is not intended to replace advice given to you by your health care provider. Make sure you discuss any questions you have with your health care provider. Document Released: 03/30/2015 Document Revised: 11/21/2015 Document Reviewed: 01/02/2015 Elsevier Interactive Patient Education  2017 [place] Prevention in the Home Falls can cause injuries. They can happen to people of all ages. There are many things you can do to make your home safe and to help prevent falls. What can I do on the outside of my home? Regularly fix the edges of walkways and driveways and fix any cracks. Remove anything that might make you trip as you walk through a door, such as a raised step or threshold. Trim any bushes or trees on the path to your home. Use bright outdoor lighting. Clear any walking paths of anything that might make someone trip, such as rocks or tools. Regularly check to see if handrails are loose or broken. Make sure that both sides of any steps have handrails. Any raised decks and porches should have guardrails on the edges. Have any leaves, snow, or ice cleared regularly. Use sand or salt on walking paths during winter. Clean up any spills in your garage right away. This includes oil or grease spills. What can I do in the bathroom? Use night lights. Install grab bars by the toilet and in the tub and shower. Do not use towel bars as grab bars. Use non-skid mats or decals in the tub or shower. If you need to sit down in the shower, use a plastic, non-slip stool. Keep the floor dry. Clean up any water that spills on the floor as soon as it happens. Remove soap buildup in the tub or shower  regularly. Attach bath mats securely with double-sided non-slip rug tape. Do not have throw rugs and other things on the floor that can make you trip. What can I do in the bedroom? Use night lights. Make sure that you have a light by your bed that is easy to reach. Do not use any sheets or blankets that are too big for your bed. They should not hang down onto the floor. Have a firm chair that has side arms. You can use this for support while you get dressed. Do not have throw rugs and other things on the floor that can make you trip. What can I do in the kitchen? Clean up any spills right away. Avoid walking on wet floors. Keep items that you use a lot in easy-to-reach places. If you need to reach something above you, use a strong step stool that has a grab bar. Keep electrical cords out of the way. Do not use floor polish or wax that makes floors slippery. If you must use wax, use non-skid floor wax. Do not have throw rugs and other things on the floor that can make you trip. What can I do with my stairs? Do not leave any items on the stairs. Make sure that there are handrails on both sides of the stairs and use them. Fix handrails that are broken or loose. Make sure that handrails are as long as the stairways. Check any carpeting to make sure that  it is firmly attached to the stairs. Fix any carpet that is loose or worn. Avoid having throw rugs at the top or bottom of the stairs. If you do have throw rugs, attach them to the floor with carpet tape. Make sure that you have a light switch at the top of the stairs and the bottom of the stairs. If you do not have them, ask someone to add them for you. What else can I do to help prevent falls? Wear shoes that: Do not have high heels. Have rubber bottoms. Are comfortable and fit you well. Are closed at the toe. Do not wear sandals. If you use a stepladder: Make sure that it is fully opened. Do not climb a closed stepladder. Make sure that  both sides of the stepladder are locked into place. Ask someone to hold it for you, if possible. Clearly mark and make sure that you can see: Any grab bars or handrails. First and last steps. Where the edge of each step is. Use tools that help you move around (mobility aids) if they are needed. These include: Canes. Walkers. Scooters. Crutches. Turn on the lights when you go into a dark area. Replace any light bulbs as soon as they burn out. Set up your furniture so you have a clear path. Avoid moving your furniture around. If any of your floors are uneven, fix them. If there are any pets around you, be aware of where they are. Review your medicines with your doctor. Some medicines can make you feel dizzy. This can increase your chance of falling. Ask your doctor what other things that you can do to help prevent falls. This information is not intended to replace advice given to you by your health care provider. Make sure you discuss any questions you have with your health care provider. Document Released: 12/28/2008 Document Revised: 08/09/2015 Document Reviewed: 04/07/2014 Elsevier Interactive Patient Education  2017 [employer].

## 2020-11-07 NOTE — Progress Notes (Signed)
Subjective:   Cristina Morgan is a 86 y.o. female who presents for Medicare Annual (Subsequent) preventive examination.  Virtual Visit via Telephone Note  I connected with  Cristina Morgan on 11/07/20 at 11:20 AM EDT by telephone and verified that I am speaking with the correct person using two identifiers.  Location: Patient: home Provider: [facility] Persons participating in the virtual visit: [place]   I discussed the limitations, risks, security and privacy concerns of performing an evaluation and management service by telephone and the availability of in person appointments. The patient expressed understanding and agreed to proceed.  Interactive audio and video telecommunications were attempted between this nurse and patient, however failed, due to patient having technical difficulties OR patient did not have access to video capability.  We continued and completed visit with audio only.  Some vital signs may be absent or patient reported.   [name], LPN   Review of Systems     Cardiac Risk Factors include: advanced age (>78mn, >>55women);diabetes mellitus;dyslipidemia;hypertension;sedentary lifestyle;family history of premature cardiovascular disease     Objective:    Today's Vitals   11/07/20 1128  PainSc: 7    There is no height or weight on file to calculate BMI.  Advanced Directives 11/07/2020 08/03/2019 09/07/2017 01/03/2015  Does Patient Have a Medical Advance Directive? Yes No No Yes  [place] Does patient want to make changes to medical advance directive? - Yes (MAU/Ambulatory/Procedural Areas - Information given) - -  [place] of [place]? No - copy requested - - No - copy requested  Would patient like information on creating a medical advance directive? - - Yes (MAU/Ambulatory/Procedural Areas - Information given) -     Current Medications (verified) Outpatient Encounter Medications as of 11/07/2020  Medication Sig   acetaminophen (TYLENOL) 325 MG tablet Take 650 mg by mouth every 6 (six) hours as needed.   aspirin 81 MG chewable tablet Chew 1 tablet (81 mg total) by mouth daily.   baclofen (LIORESAL) 20 MG tablet Take 1 tablet (20 mg total) by mouth 3 (three) times daily.   Calcium Carbonate-Vit D-Min (CALCIUM 1200 PO) Take 1 capsule by mouth daily.   cholecalciferol (VITAMIN D3) 25 MCG (1000 UNIT) tablet Take 1,000 Units by mouth daily.   famotidine (PEPCID) 20 MG tablet Take 20 mg by mouth daily as needed for heartburn or indigestion.   glipiZIDE (GLUCOTROL XL) 10 MG 24 hr tablet Take 1 tablet (10 mg total) by mouth daily with breakfast.   hydrOXYzine (ATARAX/VISTARIL) 10 MG tablet Take 1 tablet (10 mg total) by mouth at bedtime as needed.   losartan (COZAAR) 25 MG tablet Take 1 tablet (25 mg total) by mouth daily.   lovastatin (MEVACOR) 40 MG tablet Take 1 tablet (40 mg total) by mouth daily.   meloxicam (MOBIC) 15 MG tablet Take 1 tablet (15 mg total) by mouth daily.   metFORMIN (GLUCOPHAGE) 1000 MG tablet Take 1 tablet (1,000 mg total) by mouth 2 (two) times daily.   Multiple Vitamin (MULTIVITAMIN ADULT PO) Take by mouth.   Omega-3 Fatty Acids (FISH OIL) 1000 MG CAPS Take 1 capsule (1,000 mg total) by mouth daily.   sertraline (ZOLOFT) 25 MG tablet Take 1 tablet (25 mg total) by mouth daily.   No facility-administered encounter medications on file as of 11/07/2020.    Allergies (verified) Penicillins   History: Past  Medical History:  Diagnosis Date   Diabetes mellitus without complication ([place])    Hyperlipidemia    Hypertension    Past Surgical History:  Procedure Laterality Date   CATARACT EXTRACTION Bilateral 2014   COLONOSCOPY  2011   cleared for 5 yrs- [facility]   ECTOPIC PREGNANCY SURGERY     Family History  Problem Relation Age of Onset   Cancer Mother        breast   Heart  disease Mother    Diabetes Father    Heart disease Father    Heart attack Father    Heart attack Brother    Diabetes Brother    Social History   Socioeconomic History   Marital status: Married    Spouse name: Not on file   Number of children: 2   Years of education: Not on file   Highest education level: 12th grade  Occupational History   Occupation: Retired  Tobacco Use   Smoking status: Never   Smokeless tobacco: Never   Tobacco comments:    smoking cessation materials not required  Vaping Use   Vaping Use: Never used  Substance and Sexual Activity   Alcohol use: No    Alcohol/week: 0.0 standard drinks   Drug use: No   Sexual activity: Never  Other Topics Concern   Not on file  Social History Narrative   Not on file   Social Determinants of Health   Financial Resource Strain: Low Risk    Difficulty of Paying Living Expenses: Not hard at all  Food Insecurity: No Food Insecurity   Worried About Charity fundraiser in the Last Year: Never true   Ran Out of Food in the Last Year: Never true  Transportation Needs: No Transportation Needs   Lack of Transportation (Medical): No   Lack of Transportation (Non-Medical): No  Physical Activity: Inactive   Days of Exercise per Week: 0 days   Minutes of Exercise per Session: 0 min  Stress: No Stress Concern Present   Feeling of Stress : Not at all  Social Connections: Moderately Integrated   Frequency of Communication with Friends and Family: More than three times a week   Frequency of Social Gatherings with Friends and Family: More than three times a week   Attends Religious Services: More than 4 times per year   Active Member of [employer] or Organizations: No   Attends Music therapist: Never   Marital Status: Married    Tobacco Counseling Counseling given: Not Answered Tobacco comments: smoking cessation materials not required   Clinical Intake:  Pre-visit preparation completed: Yes  Pain :  0-10 Pain Score: 7  Pain Type: Chronic pain Pain Location: Back Pain Orientation: Lower Pain Descriptors / Indicators: Aching, Sore Pain Onset: More than a month ago Pain Frequency: Constant     Nutritional Risks: None Diabetes: Yes CBG done?: No Did pt. bring in CBG monitor from home?: No  How often do you need to have someone help you when you read instructions, pamphlets, or other written materials from your doctor or pharmacy?: 1 - Never  Nutrition Risk Assessment:  Has the patient had any N/V/D within the last 2 months?  No  Does the patient have any non-healing wounds?  No  Has the patient had any unintentional weight loss or weight gain?  No   Diabetes:  Is the patient diabetic?  Yes  If diabetic, was a CBG obtained today?  No  Did the patient bring in  their glucometer from home?  No  How often do you monitor your CBG's? A few times per week   Financial Strains and Diabetes Management:  Are you having any financial strains with the device, your supplies or your medication? No .  Does the patient want to be seen by Chronic Care Management for management of their diabetes?  No  Would the patient like to be referred to a Nutritionist or for Diabetic Management?  No   Diabetic Exams:  Diabetic Eye Exam: Completed 10/05/19; pt states she has upcoming appt.   Diabetic Foot Exam: Completed 12/02/19.   Interpreter Needed?: No  Information entered by :: [name] LPN   Activities of Daily Living In your present state of health, do you have any difficulty performing the following activities: 11/07/2020  Hearing? N  Vision? N  Difficulty concentrating or making decisions? N  Walking or climbing stairs? N  Dressing or bathing? N  Doing errands, shopping? N  Preparing Food and eating ? N  Using the Toilet? N  In the past six months, have you accidently leaked urine? N  Do you have problems with loss of bowel control? N  Managing your Medications? N  Managing your  Finances? N  Housekeeping or managing your Housekeeping? N  Some recent data might be hidden    Patient Care Team: Juline Patch, MD as PCP - General (Family Medicine) Vladimir Faster, [facility] (Pharmacist)  Indicate any recent Medical Services you may have received from other than [facility] providers in the past year (date may be approximate).     Assessment:   This is a routine wellness examination for Cristina Morgan.  Hearing/Vision screen Hearing Screening - Comments:: Pt denies hearing difficulty Vision Screening - Comments:: Sees Dr. Gloriann Loan for annual eye exams  Dietary issues and exercise activities discussed: Current Exercise Habits: The patient does not participate in regular exercise at present, Exercise limited by: orthopedic condition(s)   Goals Addressed             This Visit's Progress    DIET - INCREASE WATER INTAKE   On track    Recommend to drink at least 6-8 8oz glasses of water per day.       Depression Screen PHQ 2/9 Scores 11/07/2020 07/24/2020 04/09/2020 12/02/2019 08/03/2019 04/19/2019 01/25/2019  PHQ - 2 Score 0 0 0 0 0 0 0  PHQ- 9 Score - 0 0 2 - 3 2    Fall Risk Fall Risk  11/07/2020 04/09/2020 12/02/2019 08/03/2019 04/19/2019  Falls in the past year? 0 0 0 0 0  Number falls in past yr: 0 0 - 0 -  Injury with Fall? 0 0 - 0 -  Risk for fall due to : No Fall Risks No Fall Risks - No Fall Risks -  Risk for fall due to: Comment - - - - -  Follow up Falls prevention discussed Falls prevention discussed Falls evaluation completed Falls prevention discussed Falls evaluation completed    FALL RISK PREVENTION PERTAINING TO THE HOME:  Any stairs in or around the home? Yes  If so, are there any without handrails? No  Home free of loose throw rugs in walkways, pet beds, electrical cords, etc? Yes  Adequate lighting in your home to reduce risk of falls? Yes   ASSISTIVE DEVICES UTILIZED TO PREVENT FALLS:  Life alert? No  Use of a cane, walker or w/c? No  Grab bars in the  bathroom? Yes  Shower chair or bench  in shower? No  Elevated toilet seat or a handicapped toilet? No   TIMED UP AND GO:  Was the test performed? No . Telephonic visit.   Cognitive Function: Normal cognitive status assessed by direct observation by this Nurse Health Advisor. No abnormalities found.       6CIT Screen 08/03/2019 09/07/2017  What Year? 0 points 0 points  What month? 0 points 0 points  What time? 0 points 0 points  Count back from 20 0 points 0 points  Months in reverse 0 points 0 points  Repeat phrase 4 points 4 points  Total Score 4 4    Immunizations Immunization History  Administered Date(s) Administered   Fluad Quad(high Dose 65+) 01/25/2019, 12/02/2019   Influenza, High Dose Seasonal PF 01/07/2017, 01/25/2018   Influenza,inj,Quad PF,6+ Mos 01/03/2015, 12/27/2015   Moderna Sars-Covid-2 Vaccination 06/08/2019, 07/05/2019, 01/16/2020   Pneumococcal Conjugate-13 01/07/2017   Pneumococcal Polysaccharide-23 01/25/2019    TDAP status: Due, Education has been provided regarding the importance of this vaccine. Advised may receive this vaccine at local pharmacy or Health Dept. Aware to provide a copy of the vaccination record if obtained from local pharmacy or Health Dept. Verbalized acceptance and understanding.  Flu Vaccine status: Up to date  Pneumococcal vaccine status: Up to date  Covid-19 vaccine status: Completed vaccines  Qualifies for Shingles Vaccine? Yes   Zostavax completed No   Shingrix Completed?: No.    Education has been provided regarding the importance of this vaccine. Patient has been advised to call insurance company to determine out of pocket expense if they have not yet received this vaccine. Advised may also receive vaccine at local pharmacy or Health Dept. Verbalized acceptance and understanding.  Screening Tests Health Maintenance  Topic Date Due   Zoster Vaccines- Shingrix (1 of 2) Never done   OPHTHALMOLOGY EXAM  05/15/2020   COVID-19  Vaccine (4 - Booster for Moderna series) 05/15/2020   INFLUENZA VACCINE  10/15/2020   TETANUS/TDAP  05/07/2021 (Originally 04/06/1953)   FOOT EXAM  12/01/2020   HEMOGLOBIN A1C  01/24/2021   DEXA SCAN  Completed   PNA vac Low Risk Adult  Completed   HPV VACCINES  Aged Out    Health Maintenance  Health Maintenance Due  Topic Date Due   Zoster Vaccines- Shingrix (1 of 2) Never done   OPHTHALMOLOGY EXAM  05/15/2020   COVID-19 Vaccine (4 - Booster for Moderna series) 05/15/2020   INFLUENZA VACCINE  10/15/2020    Colorectal cancer screening: No longer required.   Mammogram status: No longer required due to age.  Bone density status: no longer required due to age  Lung Cancer Screening: (Low Dose CT Chest recommended if Age 60-80 years, 30 pack-year currently smoking OR have quit w/in 15years.) does not qualify.   Additional Screening:  Hepatitis C Screening: does not qualify  Vision Screening: Recommended annual ophthalmology exams for early detection of glaucoma and other disorders of the eye. Is the patient up to date with their annual eye exam?  No  Who is the provider or what is the name of the office in which the patient attends annual eye exams? Dr. Gloriann Loan    Dental Screening: Recommended annual dental exams for proper oral hygiene  Community Resource Referral / Chronic Care Management: CRR required this visit?  No   CCM required this visit?  No      Plan:     I have personally reviewed and noted the following in the patient's chart:  Medical and social history Use of alcohol, tobacco or illicit drugs  Current medications and supplements including opioid prescriptions.  Functional ability and status Nutritional status Physical activity Advanced directives List of other physicians Hospitalizations, surgeries, and ER visits in previous 12 months Vitals Screenings to include cognitive, depression, and falls Referrals and appointments  In addition, I have  reviewed and discussed with patient certain preventive protocols, quality metrics, and best practice recommendations. A written personalized care plan for preventive services as well as general preventive health recommendations were provided to patient.     [name], LPN   [id number]   Nurse Notes: none

## 2020-11-12 DIAGNOSIS — S32020A Wedge compression fracture of second lumbar vertebra, initial encounter for closed fracture: Secondary | ICD-10-CM | POA: Diagnosis not present

## 2020-11-12 DIAGNOSIS — X509XXA Other and unspecified overexertion or strenuous movements or postures, initial encounter: Secondary | ICD-10-CM | POA: Diagnosis not present

## 2020-11-12 DIAGNOSIS — Z23 Encounter for immunization: Secondary | ICD-10-CM | POA: Diagnosis not present

## 2020-11-12 DIAGNOSIS — S32010A Wedge compression fracture of first lumbar vertebra, initial encounter for closed fracture: Secondary | ICD-10-CM | POA: Diagnosis not present

## 2020-11-12 DIAGNOSIS — Y92009 Unspecified place in unspecified non-institutional (private) residence as the place of occurrence of the external cause: Secondary | ICD-10-CM | POA: Diagnosis not present

## 2020-11-16 ENCOUNTER — Other Ambulatory Visit: Payer: Self-pay

## 2020-11-16 DIAGNOSIS — E119 Type 2 diabetes mellitus without complications: Secondary | ICD-10-CM

## 2020-11-16 DIAGNOSIS — E7800 Pure hypercholesterolemia, unspecified: Secondary | ICD-10-CM

## 2020-11-16 DIAGNOSIS — I10 Essential (primary) hypertension: Secondary | ICD-10-CM

## 2020-11-16 DIAGNOSIS — M1711 Unilateral primary osteoarthritis, right knee: Secondary | ICD-10-CM

## 2020-11-16 MED ORDER — LOSARTAN POTASSIUM 25 MG PO TABS: 25.0000 mg | ORAL_TABLET | Freq: Every day | ORAL | 0 refills | Status: DC

## 2020-11-16 MED ORDER — METFORMIN HCL 1000 MG PO TABS: 1000.0000 mg | ORAL_TABLET | Freq: Two times a day (BID) | ORAL | 0 refills | Status: DC

## 2020-11-16 MED ORDER — MELOXICAM 15 MG PO TABS: 15.0000 mg | ORAL_TABLET | Freq: Every day | ORAL | 5 refills | Status: DC

## 2020-11-16 MED ORDER — LOVASTATIN 40 MG PO TABS: 40.0000 mg | ORAL_TABLET | Freq: Every day | ORAL | 0 refills | Status: DC

## 2020-11-16 NOTE — Progress Notes (Signed)
Sent in losartan, lovastatin, meloxicam and metformin [facility]

## 2020-11-20 ENCOUNTER — Telehealth: Payer: Self-pay

## 2020-11-20 DIAGNOSIS — E119 Type 2 diabetes mellitus without complications: Secondary | ICD-10-CM

## 2020-11-20 NOTE — Progress Notes (Signed)
    Chronic Care Management Pharmacy Assistant   Name: Cristina Morgan  MRN: [id number] DOB: 04/07/1934   Reason for Encounter: Mediation coordination call    Unable to reach patient, Called and left voicemail 11/20/20, and 11/21/20  Medications: Outpatient Encounter Medications as of 11/20/2020  Medication Sig   acetaminophen (TYLENOL) 325 MG tablet Take 650 mg by mouth every 6 (six) hours as needed.   aspirin 81 MG chewable tablet Chew 1 tablet (81 mg total) by mouth daily.   baclofen (LIORESAL) 20 MG tablet Take 1 tablet (20 mg total) by mouth 3 (three) times daily.   Calcium Carbonate-Vit D-Min (CALCIUM 1200 PO) Take 1 capsule by mouth daily.   cholecalciferol (VITAMIN D3) 25 MCG (1000 UNIT) tablet Take 1,000 Units by mouth daily.   famotidine (PEPCID) 20 MG tablet Take 20 mg by mouth daily as needed for heartburn or indigestion.   glipiZIDE (GLUCOTROL XL) 10 MG 24 hr tablet Take 1 tablet (10 mg total) by mouth daily with breakfast.   hydrOXYzine (ATARAX/VISTARIL) 10 MG tablet Take 1 tablet (10 mg total) by mouth at bedtime as needed.   losartan (COZAAR) 25 MG tablet Take 1 tablet (25 mg total) by mouth daily.   lovastatin (MEVACOR) 40 MG tablet Take 1 tablet (40 mg total) by mouth daily.   meloxicam (MOBIC) 15 MG tablet Take 1 tablet (15 mg total) by mouth daily.   metFORMIN (GLUCOPHAGE) 1000 MG tablet Take 1 tablet (1,000 mg total) by mouth 2 (two) times daily.   Multiple Vitamin (MULTIVITAMIN ADULT PO) Take by mouth.   Omega-3 Fatty Acids (FISH OIL) 1000 MG CAPS Take 1 capsule (1,000 mg total) by mouth daily.   sertraline (ZOLOFT) 25 MG tablet Take 1 tablet (25 mg total) by mouth daily.   No facility-administered encounter medications on file as of 11/20/2020.   Reviewed chart for medication changes ahead of medication coordination call.  No OVs, Consults, or hospital visits since last care coordination call/Pharmacist visit. (If appropriate, list visit date, provider name)  No  medication changes indicated OR if recent visit, treatment plan here.  BP Readings from Last 3 Encounters:  10/30/20 (!) 152/78  07/24/20 120/76  04/09/20 138/72    Lab Results  Component Value Date   HGBA1C 6.8 (H) 07/24/2020    Unable to reach patient, Called and left voicemail 11/20/20, and 11/21/20  Patient obtains medications through Adherence Packaging  90 Days   Last adherence delivery included:  Sertraline 25 mg Take one tab every morning Lovastatin 40 mg Take one tab every eveining Losartan 25 mg Take one tab every evening Glipizide Er 10 mg Take one tab every morning with breakfast Metformin 1000 mg Take one tab twice daily   Patient is due for next adherence delivery on: 11/27/20. Called patient and reviewed medications and coordinated delivery.  This delivery to include: Meloxicam 15 mg  sertraline (ZOLOFT) 25 MG tablet losartan (COZAAR) 25 MG tablet lovastatin (MEVACOR) 40 MG tablet metFORMIN (GLUCOPHAGE) 1000 MG tablet glipiZIDE (GLUCOTROL XL) 10 MG 24 hr tablet    Patient declined the following medications (meds) due to (reason)  Patient needs refills for Meloxicam 15 mg  sertraline (ZOLOFT) 25 MG tablet losartan (COZAAR) 25 MG tablet lovastatin (MEVACOR) 40 MG tablet metFORMIN (GLUCOPHAGE) 1000 MG tablet glipiZIDE (GLUCOTROL XL) 10 MG 24 hr tablet.     Andee Poles, CMA

## 2020-11-27 MED ORDER — GLIPIZIDE ER 10 MG PO TB24: 10.0000 mg | ORAL_TABLET | Freq: Every day | ORAL | 0 refills | Status: DC

## 2020-11-27 NOTE — Addendum Note (Signed)
Addended by: Madelin Rear on: 11/27/2020 11:35 AM   Modules accepted: Orders

## 2020-12-05 ENCOUNTER — Ambulatory Visit: Payer: Medicare Other | Admitting: Family Medicine

## 2020-12-12 ENCOUNTER — Encounter: Payer: Self-pay | Admitting: Family Medicine

## 2020-12-12 ENCOUNTER — Other Ambulatory Visit: Payer: Self-pay

## 2020-12-12 ENCOUNTER — Ambulatory Visit (INDEPENDENT_AMBULATORY_CARE_PROVIDER_SITE_OTHER): Payer: Medicare Other | Admitting: Family Medicine

## 2020-12-12 VITALS — BP 130/62 | HR 80 | Ht 67.0 in | Wt 141.0 lb

## 2020-12-12 DIAGNOSIS — Z23 Encounter for immunization: Secondary | ICD-10-CM | POA: Diagnosis not present

## 2020-12-12 DIAGNOSIS — M8588 Other specified disorders of bone density and structure, other site: Secondary | ICD-10-CM | POA: Diagnosis not present

## 2020-12-12 DIAGNOSIS — S32000D Wedge compression fracture of unspecified lumbar vertebra, subsequent encounter for fracture with routine healing: Secondary | ICD-10-CM

## 2020-12-12 DIAGNOSIS — Z780 Asymptomatic menopausal state: Secondary | ICD-10-CM

## 2020-12-12 DIAGNOSIS — E119 Type 2 diabetes mellitus without complications: Secondary | ICD-10-CM

## 2020-12-12 NOTE — Progress Notes (Signed)
Date:  12/12/2020   Name:  Cristina Morgan   DOB:  04-18-1934   MRN:  [id number]   Chief Complaint: Diabetes and Flu Vaccine  Diabetes She presents for her follow-up diabetic visit. She has type 2 diabetes mellitus. No MedicAlert identification noted. Her disease course has been stable. Pertinent negatives for hypoglycemia include no confusion, dizziness, headaches, hunger, mood changes, nervousness/anxiousness, pallor, seizures, sleepiness, speech difficulty, sweats or tremors. Pertinent negatives for diabetes include no blurred vision, no chest pain, no fatigue, no foot paresthesias, no foot ulcerations, no polydipsia, no polyphagia, no polyuria, no visual change, no weakness and no weight loss. There are no hypoglycemic complications. Symptoms are stable. There are no diabetic complications. Risk factors for coronary artery disease include diabetes mellitus, hypertension and dyslipidemia. Current diabetic treatment includes oral agent (dual therapy). She is compliant with treatment all of the time. She is following a generally healthy diet. Meal planning includes avoidance of concentrated sweets and carbohydrate counting. She has not had a previous visit with a dietitian. She rarely participates in exercise. Her breakfast blood glucose is taken between 6-7 am. Her overall blood glucose range is 130-140 mg/dl. An ACE inhibitor/angiotensin II receptor blocker is being taken. She does not see a podiatrist.Eye exam is not current.   Lab Results  Component Value Date   CREATININE 0.74 07/24/2020   BUN 15 07/24/2020   NA 139 07/24/2020   K 4.1 07/24/2020   CL 102 07/24/2020   CO2 22 07/24/2020   Lab Results  Component Value Date   CHOL 176 07/24/2020   HDL 74 07/24/2020   LDLCALC 90 07/24/2020   TRIG 63 07/24/2020   CHOLHDL 2.7 03/01/2018   No results found for: TSH Lab Results  Component Value Date   HGBA1C 6.8 (H) 07/24/2020   No results found for: WBC, HGB, HCT, MCV, PLT Lab  Results  Component Value Date   ALT 14 07/24/2020   AST 19 07/24/2020   ALKPHOS 68 07/24/2020   BILITOT 0.5 07/24/2020     Review of Systems  Constitutional:  Negative for chills, fatigue, fever and weight loss.  HENT:  Negative for drooling, ear discharge, ear pain and sore throat.   Eyes:  Negative for blurred vision.  Respiratory:  Negative for cough, shortness of breath and wheezing.   Cardiovascular:  Negative for chest pain, palpitations and leg swelling.  Gastrointestinal:  Negative for abdominal pain, blood in stool, constipation, diarrhea and nausea.  Endocrine: Negative for polydipsia, polyphagia and polyuria.  Genitourinary:  Negative for dysuria, frequency, hematuria and urgency.  Musculoskeletal:  Negative for back pain, myalgias and neck pain.  Skin:  Negative for pallor and rash.  Allergic/Immunologic: Negative for environmental allergies.  Neurological:  Negative for dizziness, tremors, seizures, speech difficulty, weakness and headaches.  Hematological:  Does not bruise/bleed easily.  Psychiatric/Behavioral:  Negative for confusion and suicidal ideas. The patient is not nervous/anxious.    Patient Active Problem List   Diagnosis Date Noted   Closed compression fracture of second lumbar vertebra ([place]) 10/28/2020   Need for vaccination against Streptococcus pneumoniae using pneumococcal conjugate vaccine 13 01/07/2017   Primary osteoarthritis of right knee 12/08/2016   PNA (pneumonia) 07/09/2015   Acute chest pain 03/20/2014   Type 2 diabetes mellitus ([place]) 03/20/2014   Hypercholesterolemia 03/20/2014   Essential hypertension 03/20/2014    Allergies  Allergen Reactions   Penicillins Other (See Comments)    Past Surgical History:  Procedure Laterality Date  CATARACT EXTRACTION Bilateral 2014   COLONOSCOPY  2011   cleared for 5 yrs- [facility]   ECTOPIC PREGNANCY SURGERY      Social History   Tobacco Use   Smoking status: Never   Smokeless tobacco: Never    Tobacco comments:    smoking cessation materials not required  Vaping Use   Vaping Use: Never used  Substance Use Topics   Alcohol use: No    Alcohol/week: 0.0 standard drinks   Drug use: No     Medication list has been reviewed and updated.  Current Meds  Medication Sig   acetaminophen (TYLENOL) 325 MG tablet Take 650 mg by mouth every 6 (six) hours as needed.   aspirin 81 MG chewable tablet Chew 1 tablet (81 mg total) by mouth daily.   baclofen (LIORESAL) 20 MG tablet Take 1 tablet (20 mg total) by mouth 3 (three) times daily.   Calcium Carbonate-Vit D-Min (CALCIUM 1200 PO) Take 1 capsule by mouth daily.   cholecalciferol (VITAMIN D3) 25 MCG (1000 UNIT) tablet Take 1,000 Units by mouth daily.   famotidine (PEPCID) 20 MG tablet Take 20 mg by mouth daily as needed for heartburn or indigestion.   glipiZIDE (GLUCOTROL XL) 10 MG 24 hr tablet Take 1 tablet (10 mg total) by mouth daily with breakfast.   hydrOXYzine (ATARAX/VISTARIL) 10 MG tablet Take 1 tablet (10 mg total) by mouth at bedtime as needed.   losartan (COZAAR) 25 MG tablet Take 1 tablet (25 mg total) by mouth daily.   lovastatin (MEVACOR) 40 MG tablet Take 1 tablet (40 mg total) by mouth daily.   meloxicam (MOBIC) 15 MG tablet Take 1 tablet (15 mg total) by mouth daily.   metFORMIN (GLUCOPHAGE) 1000 MG tablet Take 1 tablet (1,000 mg total) by mouth 2 (two) times daily.   Multiple Vitamin (MULTIVITAMIN ADULT PO) Take by mouth.   Omega-3 Fatty Acids (FISH OIL) 1000 MG CAPS Take 1 capsule (1,000 mg total) by mouth daily.   sertraline (ZOLOFT) 25 MG tablet Take 1 tablet (25 mg total) by mouth daily.    PHQ 2/9 Scores 12/12/2020 11/07/2020 07/24/2020 04/09/2020  PHQ - 2 Score 0 0 0 0  PHQ- 9 Score 4 - 0 0    GAD 7 : Generalized Anxiety Score 12/12/2020 07/24/2020 04/09/2020 12/02/2019  Nervous, Anxious, on Edge 0 1 0 0  Control/stop worrying 0 0 0 0  Worry too much - different things 0 0 0 0  Trouble relaxing 0 0 0 0  Restless  0 0 0 0  Easily annoyed or irritable 0 0 0 0  Afraid - awful might happen 0 0 0 0  Total GAD 7 Score 0 1 0 0  Anxiety Difficulty - Not difficult at all Not difficult at all -    BP Readings from Last 3 Encounters:  10/30/20 (!) 152/78  07/24/20 120/76  04/09/20 138/72    Physical Exam Vitals and nursing note reviewed.  Constitutional:      Appearance: She is well-developed.  HENT:     Head: Normocephalic.     Right Ear: Tympanic membrane and external ear normal.     Left Ear: Tympanic membrane and external ear normal.  Eyes:     General: Lids are everted, no foreign bodies appreciated. No scleral icterus.       Left eye: No foreign body or hordeolum.     Conjunctiva/sclera: Conjunctivae normal.     Right eye: Right conjunctiva is not injected.  Left eye: Left conjunctiva is not injected.     Pupils: Pupils are equal, round, and reactive to light.  Neck:     Thyroid: No thyromegaly.     Vascular: No JVD.     Trachea: No tracheal deviation.  Cardiovascular:     Rate and Rhythm: Normal rate and regular rhythm.     Heart sounds: Normal heart sounds. No murmur heard.   No friction rub. No gallop.  Pulmonary:     Effort: Pulmonary effort is normal. No respiratory distress.     Breath sounds: Normal breath sounds. No wheezing, rhonchi or rales.  Abdominal:     General: Bowel sounds are normal.     Palpations: Abdomen is soft. There is no mass.     Tenderness: There is no abdominal tenderness. There is no guarding or rebound.  Musculoskeletal:        General: No tenderness. Normal range of motion.     Cervical back: Normal range of motion and neck supple.  Lymphadenopathy:     Cervical: No cervical adenopathy.  Skin:    General: Skin is warm.     Findings: No rash.  Neurological:     Mental Status: She is alert and oriented to person, place, and time.     Cranial Nerves: No cranial nerve deficit.     Deep Tendon Reflexes: Reflexes normal.  Psychiatric:        Mood  and Affect: Mood is not anxious or depressed.    Wt Readings from Last 3 Encounters:  12/12/20 141 lb (64 kg)  10/30/20 148 lb (67.1 kg)  07/24/20 149 lb (67.6 kg)    Ht 5\' 7"  (1.702 m)   Wt 141 lb (64 kg)   BMI 22.08 kg/m   Assessment and Plan:  1. Type 2 diabetes mellitus without complication, without long-term current use of insulin (HCC) Chronic.  Controlled.  Stable.  Repeat A1c to see current level of control and we will repeat microalbuminuria at this time. - HgB A1c - Microalbumin, urine  2. Osteopenia of lumbar spine Chronic.  Persistent.  Currently treating with vitamin D 800 units and 1200 units calcium.  Patient had a twisting injury which caused a compression fracture for which she is followed at [facility] for a compression fracture.  He has been almost 3 years and we will repeat a DEXA scan at this time to see if we need to approach with further treatment options. - DG Bone Density; Future  3. Need for immunization against influenza Discussed and admit - Flu Vaccine QUAD High Dose(Fluad)  4. Menopause Noted. - DG Bone Density; Future  5. Compression fracture of lumbar vertebra with routine healing, unspecified lumbar vertebral level, subsequent encounter New onset.  Currently symptomatic with pain in the thoracic area which is currently being treated by [facility]. - DG Bone Density; Future

## 2020-12-13 LAB — HEMOGLOBIN A1C
Est. average glucose Bld gHb Est-mCnc: 131 mg/dL
Hgb A1c MFr Bld: 6.2 % — ABNORMAL HIGH (ref 4.8–5.6)

## 2020-12-13 LAB — MICROALBUMIN, URINE: Microalbumin, Urine: 8.5 ug/mL

## 2020-12-14 ENCOUNTER — Other Ambulatory Visit: Payer: Self-pay

## 2020-12-14 DIAGNOSIS — I10 Essential (primary) hypertension: Secondary | ICD-10-CM

## 2020-12-14 DIAGNOSIS — E119 Type 2 diabetes mellitus without complications: Secondary | ICD-10-CM

## 2020-12-14 MED ORDER — LOSARTAN POTASSIUM 50 MG PO TABS: 50.0000 mg | ORAL_TABLET | Freq: Every day | ORAL | 0 refills | Status: DC

## 2020-12-20 ENCOUNTER — Other Ambulatory Visit: Payer: Self-pay | Admitting: Family Medicine

## 2020-12-20 DIAGNOSIS — M545 Low back pain, unspecified: Secondary | ICD-10-CM

## 2020-12-20 DIAGNOSIS — S32000D Wedge compression fracture of unspecified lumbar vertebra, subsequent encounter for fracture with routine healing: Secondary | ICD-10-CM

## 2020-12-20 NOTE — Telephone Encounter (Signed)
Requested medication (s) are due for refill today - no  Requested medication (s) are on the active medication list -yes  Future visit scheduled -yes  Last refill: 12/20/20  Notes to clinic: Request RF: non delegated Rx- future fill request  Requested Prescriptions  Pending Prescriptions Disp Refills   baclofen (LIORESAL) 20 MG tablet [Pharmacy Med Name: baclofen 20 mg tablet] 30 tablet 1    Sig: TAKE ONE TABLET BY MOUTH THREE TIMES DAILY     Not Delegated - Analgesics:  Muscle Relaxants Failed - 12/20/2020 10:58 AM      Failed - This refill cannot be delegated      Passed - Valid encounter within last 6 months    Recent Outpatient Visits           1 week ago Type 2 diabetes mellitus without complication, without long-term current use of insulin ([place])   [facility] Juline Patch, MD   1 month ago Acute low back pain without sciatica, unspecified back pain laterality   [facility] Juline Patch, MD   4 months ago Type 2 diabetes mellitus without complication, without long-term current use of insulin ([place])   [facility] Juline Patch, MD   8 months ago Type 2 diabetes mellitus without complication, without long-term current use of insulin ([place])   [facility] Juline Patch, MD   1 year ago Type 2 diabetes mellitus without complication, without long-term current use of insulin ([place])   [facility] Juline Patch, MD       Future Appointments             In 2 months Juline Patch, MD [facility], [place]   In 3 months Juline Patch, MD [facility], [place]               Requested Prescriptions  Pending Prescriptions Disp Refills   baclofen (LIORESAL) 20 MG tablet [Pharmacy Med Name: baclofen 20 mg tablet] 30 tablet 1    Sig: TAKE ONE TABLET BY MOUTH THREE TIMES DAILY     Not Delegated - Analgesics:  Muscle Relaxants Failed - 12/20/2020 10:58 AM      Failed - This refill cannot be delegated       Passed - Valid encounter within last 6 months    Recent Outpatient Visits           1 week ago Type 2 diabetes mellitus without complication, without long-term current use of insulin ([place])   [facility] Juline Patch, MD   1 month ago Acute low back pain without sciatica, unspecified back pain laterality   [facility] Juline Patch, MD   4 months ago Type 2 diabetes mellitus without complication, without long-term current use of insulin ([place])   [facility] Juline Patch, MD   8 months ago Type 2 diabetes mellitus without complication, without long-term current use of insulin ([place])   [facility] Juline Patch, MD   1 year ago Type 2 diabetes mellitus without complication, without long-term current use of insulin ([place])   [facility] Juline Patch, MD       Future Appointments             In 2 months Juline Patch, MD [facility], [place]   In 3 months Juline Patch, MD [facility], [facility]

## 2020-12-21 ENCOUNTER — Other Ambulatory Visit: Payer: Self-pay | Admitting: Family Medicine

## 2020-12-21 DIAGNOSIS — S32020A Wedge compression fracture of second lumbar vertebra, initial encounter for closed fracture: Secondary | ICD-10-CM | POA: Diagnosis not present

## 2020-12-21 DIAGNOSIS — M8588 Other specified disorders of bone density and structure, other site: Secondary | ICD-10-CM | POA: Diagnosis not present

## 2020-12-21 DIAGNOSIS — S32010A Wedge compression fracture of first lumbar vertebra, initial encounter for closed fracture: Secondary | ICD-10-CM | POA: Diagnosis not present

## 2020-12-21 DIAGNOSIS — Y92009 Unspecified place in unspecified non-institutional (private) residence as the place of occurrence of the external cause: Secondary | ICD-10-CM | POA: Diagnosis not present

## 2020-12-21 DIAGNOSIS — S32010D Wedge compression fracture of first lumbar vertebra, subsequent encounter for fracture with routine healing: Secondary | ICD-10-CM | POA: Diagnosis not present

## 2020-12-21 DIAGNOSIS — X500XXA Overexertion from strenuous movement or load, initial encounter: Secondary | ICD-10-CM | POA: Diagnosis not present

## 2020-12-21 DIAGNOSIS — E119 Type 2 diabetes mellitus without complications: Secondary | ICD-10-CM

## 2020-12-21 DIAGNOSIS — M47816 Spondylosis without myelopathy or radiculopathy, lumbar region: Secondary | ICD-10-CM | POA: Diagnosis not present

## 2021-01-15 DIAGNOSIS — Z8507 Personal history of malignant neoplasm of pancreas: Secondary | ICD-10-CM | POA: Diagnosis not present

## 2021-01-15 DIAGNOSIS — C189 Malignant neoplasm of colon, unspecified: Secondary | ICD-10-CM | POA: Diagnosis not present

## 2021-01-15 DIAGNOSIS — Z800 Family history of malignant neoplasm of digestive organs: Secondary | ICD-10-CM | POA: Diagnosis not present

## 2021-01-15 DIAGNOSIS — C259 Malignant neoplasm of pancreas, unspecified: Secondary | ICD-10-CM | POA: Diagnosis not present

## 2021-01-18 DIAGNOSIS — S32010D Wedge compression fracture of first lumbar vertebra, subsequent encounter for fracture with routine healing: Secondary | ICD-10-CM | POA: Diagnosis not present

## 2021-01-18 DIAGNOSIS — M419 Scoliosis, unspecified: Secondary | ICD-10-CM | POA: Diagnosis not present

## 2021-01-18 DIAGNOSIS — S32040A Wedge compression fracture of fourth lumbar vertebra, initial encounter for closed fracture: Secondary | ICD-10-CM | POA: Diagnosis not present

## 2021-01-18 DIAGNOSIS — M5136 Other intervertebral disc degeneration, lumbar region: Secondary | ICD-10-CM | POA: Diagnosis not present

## 2021-01-18 DIAGNOSIS — X500XXD Overexertion from strenuous movement or load, subsequent encounter: Secondary | ICD-10-CM | POA: Diagnosis not present

## 2021-01-18 DIAGNOSIS — M4856XA Collapsed vertebra, not elsewhere classified, lumbar region, initial encounter for fracture: Secondary | ICD-10-CM | POA: Diagnosis not present

## 2021-01-18 DIAGNOSIS — S32020G Wedge compression fracture of second lumbar vertebra, subsequent encounter for fracture with delayed healing: Secondary | ICD-10-CM | POA: Diagnosis not present

## 2021-01-24 ENCOUNTER — Ambulatory Visit: Payer: Medicare Other | Admitting: Family Medicine

## 2021-01-29 ENCOUNTER — Ambulatory Visit: Payer: Self-pay

## 2021-01-29 NOTE — Telephone Encounter (Signed)
Pts daughter calling stating that the pt has a compression fracture. She states that the past month the pt has had a hard time keeping food down and tends to vomit 2-3x a week and she is concerned. She is requesting to have someone give a call back to discuss this further. Please advise.   Daughter reports pt. Has compression fractures and has had a lot of pain. 1 month ago started having decreased appetite and vomiting 2-3 x a week. Seems weaker. Has dizziness that comes and goes. Requests virtual visit Friday. Appointment made. Call daughter's phone for virtual.    Reason for Disposition  [1] MILD vomiting with diarrhea AND [2] present > 5 days  Answer Assessment - Initial Assessment Questions 1. VOMITING SEVERITY: "How many times have you vomited in the past 24 hours?"     - MILD:  1 - 2 times/day    - MODERATE: 3 - 5 times/day, decreased oral intake without significant weight loss or symptoms of dehydration    - SEVERE: 6 or more times/day, vomits everything or nearly everything, with significant weight loss, symptoms of dehydration      2-3 x week 2. ONSET: "When did the vomiting begin?"      1 month ago 3. FLUIDS: "What fluids or food have you vomited up today?" "Have you been able to keep any fluids down?"     Yes 4. ABDOMINAL PAIN: "Are your having any abdominal pain?" If yes : "How bad is it and what does it feel like?" (e.g., crampy, dull, intermittent, constant)      No 5. DIARRHEA: "Is there any diarrhea?" If Yes, ask: "How many times today?"      No 6. CONTACTS: "Is there anyone else in the family with the same symptoms?"      No 7. CAUSE: "What do you think is causing your vomiting?"     Unsure 8. HYDRATION STATUS: "Any signs of dehydration?" (e.g., dry mouth [not only dry lips], too weak to stand) "When did you last urinate?"     No 9. OTHER SYMPTOMS: "Do you have any other symptoms?" (e.g., fever, headache, vertigo, vomiting blood or coffee grounds, recent head injury)      No 10. PREGNANCY: "Is there any chance you are pregnant?" "When was your last menstrual period?"       No  Protocols used: Vomiting-A-AH

## 2021-02-01 ENCOUNTER — Ambulatory Visit: Payer: Medicare Other | Admitting: Family Medicine

## 2021-02-01 ENCOUNTER — Encounter: Payer: Self-pay | Admitting: Family Medicine

## 2021-02-01 ENCOUNTER — Other Ambulatory Visit: Payer: Self-pay

## 2021-02-01 ENCOUNTER — Ambulatory Visit (INDEPENDENT_AMBULATORY_CARE_PROVIDER_SITE_OTHER): Payer: Medicare Other | Admitting: Family Medicine

## 2021-02-01 VITALS — BP 130/70 | HR 97 | Temp 97.4°F | Wt 135.8 lb

## 2021-02-01 DIAGNOSIS — E119 Type 2 diabetes mellitus without complications: Secondary | ICD-10-CM | POA: Diagnosis not present

## 2021-02-01 DIAGNOSIS — R634 Abnormal weight loss: Secondary | ICD-10-CM | POA: Diagnosis not present

## 2021-02-01 DIAGNOSIS — M4856XA Collapsed vertebra, not elsewhere classified, lumbar region, initial encounter for fracture: Secondary | ICD-10-CM | POA: Diagnosis not present

## 2021-02-01 DIAGNOSIS — S32020G Wedge compression fracture of second lumbar vertebra, subsequent encounter for fracture with delayed healing: Secondary | ICD-10-CM | POA: Diagnosis not present

## 2021-02-01 DIAGNOSIS — M9973 Connective tissue and disc stenosis of intervertebral foramina of lumbar region: Secondary | ICD-10-CM | POA: Diagnosis not present

## 2021-02-01 DIAGNOSIS — E860 Dehydration: Secondary | ICD-10-CM | POA: Diagnosis not present

## 2021-02-01 DIAGNOSIS — R112 Nausea with vomiting, unspecified: Secondary | ICD-10-CM | POA: Diagnosis not present

## 2021-02-01 MED ORDER — PANTOPRAZOLE SODIUM 40 MG PO TBEC
40.0000 mg | DELAYED_RELEASE_TABLET | Freq: Every day | ORAL | 3 refills | Status: DC
Start: 2021-02-01 — End: 2021-02-22

## 2021-02-01 NOTE — Progress Notes (Signed)
Weight loss   Date:  02/01/2021   Name:  Cristina Morgan   DOB:  03-Sep-1934   MRN:  [id number]   Chief Complaint: No chief complaint on file.  Emesis  This is a new problem. The current episode started more than 1 month ago. Episode frequency: 1-2/week. The problem has been waxing and waning (usually fruit). Emesis appearance: no blood. Associated symptoms include weight loss. Pertinent negatives include no abdominal pain, chest pain, chills, coughing, diarrhea, dizziness, fever, headaches or myalgias. Risk factors: constant pain.   Lab Results  Component Value Date   CREATININE 0.74 07/24/2020   BUN 15 07/24/2020   NA 139 07/24/2020   K 4.1 07/24/2020   CL 102 07/24/2020   CO2 22 07/24/2020   Lab Results  Component Value Date   CHOL 176 07/24/2020   HDL 74 07/24/2020   LDLCALC 90 07/24/2020   TRIG 63 07/24/2020   CHOLHDL 2.7 03/01/2018   No results found for: TSH Lab Results  Component Value Date   HGBA1C 6.2 (H) 12/12/2020   No results found for: WBC, HGB, HCT, MCV, PLT Lab Results  Component Value Date   ALT 14 07/24/2020   AST 19 07/24/2020   ALKPHOS 68 07/24/2020   BILITOT 0.5 07/24/2020   No components found for: VITD  Review of Systems  Constitutional:  Positive for weight loss. Negative for chills and fever.  HENT:  Negative for drooling, ear discharge, ear pain and sore throat.   Respiratory:  Negative for cough, shortness of breath and wheezing.   Cardiovascular:  Negative for chest pain, palpitations and leg swelling.  Gastrointestinal:  Positive for vomiting. Negative for abdominal pain, blood in stool, constipation, diarrhea and nausea.  Endocrine: Negative for polydipsia.  Genitourinary:  Negative for dysuria, frequency, hematuria and urgency.  Musculoskeletal:  Negative for back pain, myalgias and neck pain.  Skin:  Negative for rash.  Allergic/Immunologic: Negative for environmental allergies.  Neurological:  Negative for dizziness and headaches.   Hematological:  Does not bruise/bleed easily.  Psychiatric/Behavioral:  Negative for suicidal ideas. The patient is not nervous/anxious.    Patient Active Problem List   Diagnosis Date Noted   Closed compression fracture of second lumbar vertebra ([place]) 10/28/2020   Need for vaccination against Streptococcus pneumoniae using pneumococcal conjugate vaccine 13 01/07/2017   Primary osteoarthritis of right knee 12/08/2016   PNA (pneumonia) 07/09/2015   Acute chest pain 03/20/2014   Type 2 diabetes mellitus ([place]) 03/20/2014   Hypercholesterolemia 03/20/2014   Essential hypertension 03/20/2014    Allergies  Allergen Reactions   Penicillins Other (See Comments)    Past Surgical History:  Procedure Laterality Date   CATARACT EXTRACTION Bilateral 2014   COLONOSCOPY  2011   cleared for 5 yrs- [facility]   ECTOPIC PREGNANCY SURGERY      Social History   Tobacco Use   Smoking status: Never   Smokeless tobacco: Never   Tobacco comments:    smoking cessation materials not required  Vaping Use   Vaping Use: Never used  Substance Use Topics   Alcohol use: No    Alcohol/week: 0.0 standard drinks   Drug use: No     Medication list has been reviewed and updated.  Current Meds  Medication Sig   acetaminophen (TYLENOL) 325 MG tablet Take 650 mg by mouth every 6 (six) hours as needed.   aspirin 81 MG chewable tablet Chew 1 tablet (81 mg total) by mouth daily.   baclofen (LIORESAL) 20 MG  tablet TAKE ONE TABLET BY MOUTH THREE TIMES DAILY   Calcium Carbonate-Vit D-Min (CALCIUM 1200 PO) Take 1 capsule by mouth daily.   cholecalciferol (VITAMIN D3) 25 MCG (1000 UNIT) tablet Take 1,000 Units by mouth daily.   famotidine (PEPCID) 20 MG tablet Take 20 mg by mouth daily as needed for heartburn or indigestion.   glipiZIDE (GLUCOTROL XL) 10 MG 24 hr tablet TAKE ONE TABLET BY MOUTH EVERY MORNING   hydrOXYzine (ATARAX/VISTARIL) 10 MG tablet Take 1 tablet (10 mg total) by mouth at bedtime as needed.    losartan (COZAAR) 50 MG tablet Take 1 tablet (50 mg total) by mouth daily.   lovastatin (MEVACOR) 40 MG tablet Take 1 tablet (40 mg total) by mouth daily.   meloxicam (MOBIC) 15 MG tablet Take 1 tablet (15 mg total) by mouth daily.   metFORMIN (GLUCOPHAGE) 1000 MG tablet Take 1 tablet (1,000 mg total) by mouth 2 (two) times daily.   Multiple Vitamin (MULTIVITAMIN ADULT PO) Take by mouth.   Omega-3 Fatty Acids (FISH OIL) 1000 MG CAPS Take 1 capsule (1,000 mg total) by mouth daily.   sertraline (ZOLOFT) 25 MG tablet Take 1 tablet (25 mg total) by mouth daily.    PHQ 2/9 Scores 02/01/2021 12/12/2020 11/07/2020 07/24/2020  PHQ - 2 Score 0 0 0 0  PHQ- 9 Score 2 4 - 0    GAD 7 : Generalized Anxiety Score 02/01/2021 12/12/2020 07/24/2020 04/09/2020  Nervous, Anxious, on Edge 0 0 1 0  Control/stop worrying 0 0 0 0  Worry too much - different things 0 0 0 0  Trouble relaxing 1 0 0 0  Restless - 0 0 0  Easily annoyed or irritable 0 0 0 0  Afraid - awful might happen 0 0 0 0  Total GAD 7 Score - 0 1 0  Anxiety Difficulty - - Not difficult at all Not difficult at all    BP Readings from Last 3 Encounters:  02/01/21 130/70  12/12/20 130/62  10/30/20 (!) 152/78    Physical Exam Vitals and nursing note reviewed.  Constitutional:      General: She is not in acute distress.    Appearance: She is not diaphoretic.  HENT:     Head: Normocephalic and atraumatic.     Right Ear: External ear normal.     Left Ear: External ear normal.     Nose: Nose normal.  Eyes:     General:        Right eye: No discharge.        Left eye: No discharge.     Conjunctiva/sclera: Conjunctivae normal.     Pupils: Pupils are equal, round, and reactive to light.  Neck:     Thyroid: No thyromegaly.     Vascular: No JVD.  Cardiovascular:     Rate and Rhythm: Normal rate and regular rhythm.     Heart sounds: Normal heart sounds. No murmur heard.   No friction rub. No gallop.  Pulmonary:     Effort: Pulmonary  effort is normal.     Breath sounds: Normal breath sounds.  Abdominal:     General: Bowel sounds are normal.     Palpations: Abdomen is soft. There is no mass.     Tenderness: There is no abdominal tenderness. There is no guarding.  Musculoskeletal:        General: Normal range of motion.     Cervical back: Normal range of motion and neck supple.  Lymphadenopathy:  Cervical: No cervical adenopathy.  Skin:    General: Skin is warm and dry.  Neurological:     Mental Status: She is alert.    Wt Readings from Last 3 Encounters:  02/01/21 135 lb 12.8 oz (61.6 kg)  12/12/20 141 lb (64 kg)  10/30/20 148 lb (67.1 kg)    BP 130/70   Pulse 97   Temp (!) 97.4 F (36.3 C)   Wt 135 lb 12.8 oz (61.6 kg)   BMI 21.27 kg/m   Assessment and Plan:  1. Type 2 diabetes mellitus without complication, without long-term current use of insulin (HCC) Chronic.  Controlled.  Stable.  Patient has had decreased oral intake in general over the past 6 weeks.  She has currently been on glipizide and metformin.  We will check an A1c I have requested that she discontinue her sulfonylurea/glipizide. - Hemoglobin A1c  2. Nausea and vomiting, unspecified vomiting type Chronic.  Controlled.  Stable.  We will switch out her Pepcid with Protonix 40 mg once a day.  We will referral to gastroenterology because of patient having increased nausea and vomiting with unintentional weight loss and history of diabetes. - Ambulatory referral to Gastroenterology - pantoprazole (PROTONIX) 40 MG tablet; Take 1 tablet (40 mg total) by mouth daily.  Dispense: 30 tablet; Refill: 3  3. Weight loss Patient's had about a 10 to 12 pound weight loss over the past 3 months.  In the meantime we will go ahead and eval 0 Gatorade.  Renal function panel panel hepatic function panel and lipase.  It is mention that for that we are referring to gastroenterology for evaluation of nausea and vomiting and unintentional weight loss. -  Ambulatory referral to Gastroenterology - Renal Function Panel - Hepatic Function Panel (6) - Lipase  4. Dehydration Examination notes mucous membranes to be somewhat dry and there is a level of dehydration I have encouraged to treat with 0 Gatorade

## 2021-02-02 LAB — HEPATIC FUNCTION PANEL (6)
ALT: 9 IU/L (ref 0–32)
AST: 16 IU/L (ref 0–40)
Alkaline Phosphatase: 102 IU/L (ref 44–121)
Bilirubin Total: 0.3 mg/dL (ref 0.0–1.2)
Bilirubin, Direct: 0.12 mg/dL (ref 0.00–0.40)

## 2021-02-02 LAB — RENAL FUNCTION PANEL
Albumin: 4.3 g/dL (ref 3.6–4.6)
BUN/Creatinine Ratio: 23 (ref 12–28)
BUN: 19 mg/dL (ref 8–27)
CO2: 23 mmol/L (ref 20–29)
Calcium: 10.7 mg/dL — ABNORMAL HIGH (ref 8.7–10.3)
Chloride: 101 mmol/L (ref 96–106)
Creatinine, Ser: 0.84 mg/dL (ref 0.57–1.00)
Glucose: 178 mg/dL — ABNORMAL HIGH (ref 70–99)
Phosphorus: 2.5 mg/dL — ABNORMAL LOW (ref 3.0–4.3)
Potassium: 4.3 mmol/L (ref 3.5–5.2)
Sodium: 138 mmol/L (ref 134–144)
eGFR: 68 mL/min/{1.73_m2} (ref >59)

## 2021-02-02 LAB — HEMOGLOBIN A1C
Est. average glucose Bld gHb Est-mCnc: 123 mg/dL
Hgb A1c MFr Bld: 5.9 % — ABNORMAL HIGH (ref 4.8–5.6)

## 2021-02-02 LAB — LIPASE: Lipase: 133 U/L — ABNORMAL HIGH (ref 14–85)

## 2021-02-09 LAB — PARATHYROID HORMONE, INTACT (NO CA)

## 2021-02-09 LAB — SPECIMEN STATUS REPORT

## 2021-02-10 ENCOUNTER — Other Ambulatory Visit: Payer: Self-pay | Admitting: Family Medicine

## 2021-02-10 DIAGNOSIS — F329 Major depressive disorder, single episode, unspecified: Secondary | ICD-10-CM

## 2021-02-10 DIAGNOSIS — F5102 Adjustment insomnia: Secondary | ICD-10-CM

## 2021-02-18 ENCOUNTER — Ambulatory Visit
Admission: RE | Admit: 2021-02-18 | Discharge: 2021-02-18 | Disposition: A | Discharge status: Home / Self Care | Payer: Medicare Other | Source: Ambulatory Visit | Attending: Family Medicine | Admitting: Family Medicine

## 2021-02-18 ENCOUNTER — Ambulatory Visit
Admission: RE | Admit: 2021-02-18 | Discharge: 2021-02-18 | Disposition: A | Discharge status: Home / Self Care | Payer: Medicare Other | Attending: Family Medicine | Admitting: Family Medicine

## 2021-02-18 ENCOUNTER — Other Ambulatory Visit: Payer: Self-pay

## 2021-02-18 ENCOUNTER — Ambulatory Visit (INDEPENDENT_AMBULATORY_CARE_PROVIDER_SITE_OTHER): Payer: Medicare Other | Admitting: Family Medicine

## 2021-02-18 ENCOUNTER — Encounter: Payer: Self-pay | Admitting: Family Medicine

## 2021-02-18 VITALS — BP 130/70 | HR 88 | Ht 67.0 in | Wt 131.0 lb

## 2021-02-18 DIAGNOSIS — R634 Abnormal weight loss: Secondary | ICD-10-CM | POA: Insufficient documentation

## 2021-02-18 DIAGNOSIS — R9389 Abnormal findings on diagnostic imaging of other specified body structures: Secondary | ICD-10-CM | POA: Diagnosis not present

## 2021-02-18 DIAGNOSIS — R911 Solitary pulmonary nodule: Secondary | ICD-10-CM | POA: Insufficient documentation

## 2021-02-18 DIAGNOSIS — M545 Low back pain, unspecified: Secondary | ICD-10-CM

## 2021-02-18 DIAGNOSIS — R935 Abnormal findings on diagnostic imaging of other abdominal regions, including retroperitoneum: Secondary | ICD-10-CM

## 2021-02-18 DIAGNOSIS — R0609 Other forms of dyspnea: Secondary | ICD-10-CM

## 2021-02-18 IMAGING — CR DG CHEST 2V
2 series · 2 of 2 positions shown · non-contrast
Comparison: [DATE].  [DATE].

CLINICAL DATA: Weight loss. History of right middle lobe nodule.
Hypercalcemia.

EXAM:
CHEST - 2 VIEW

[chest pa]
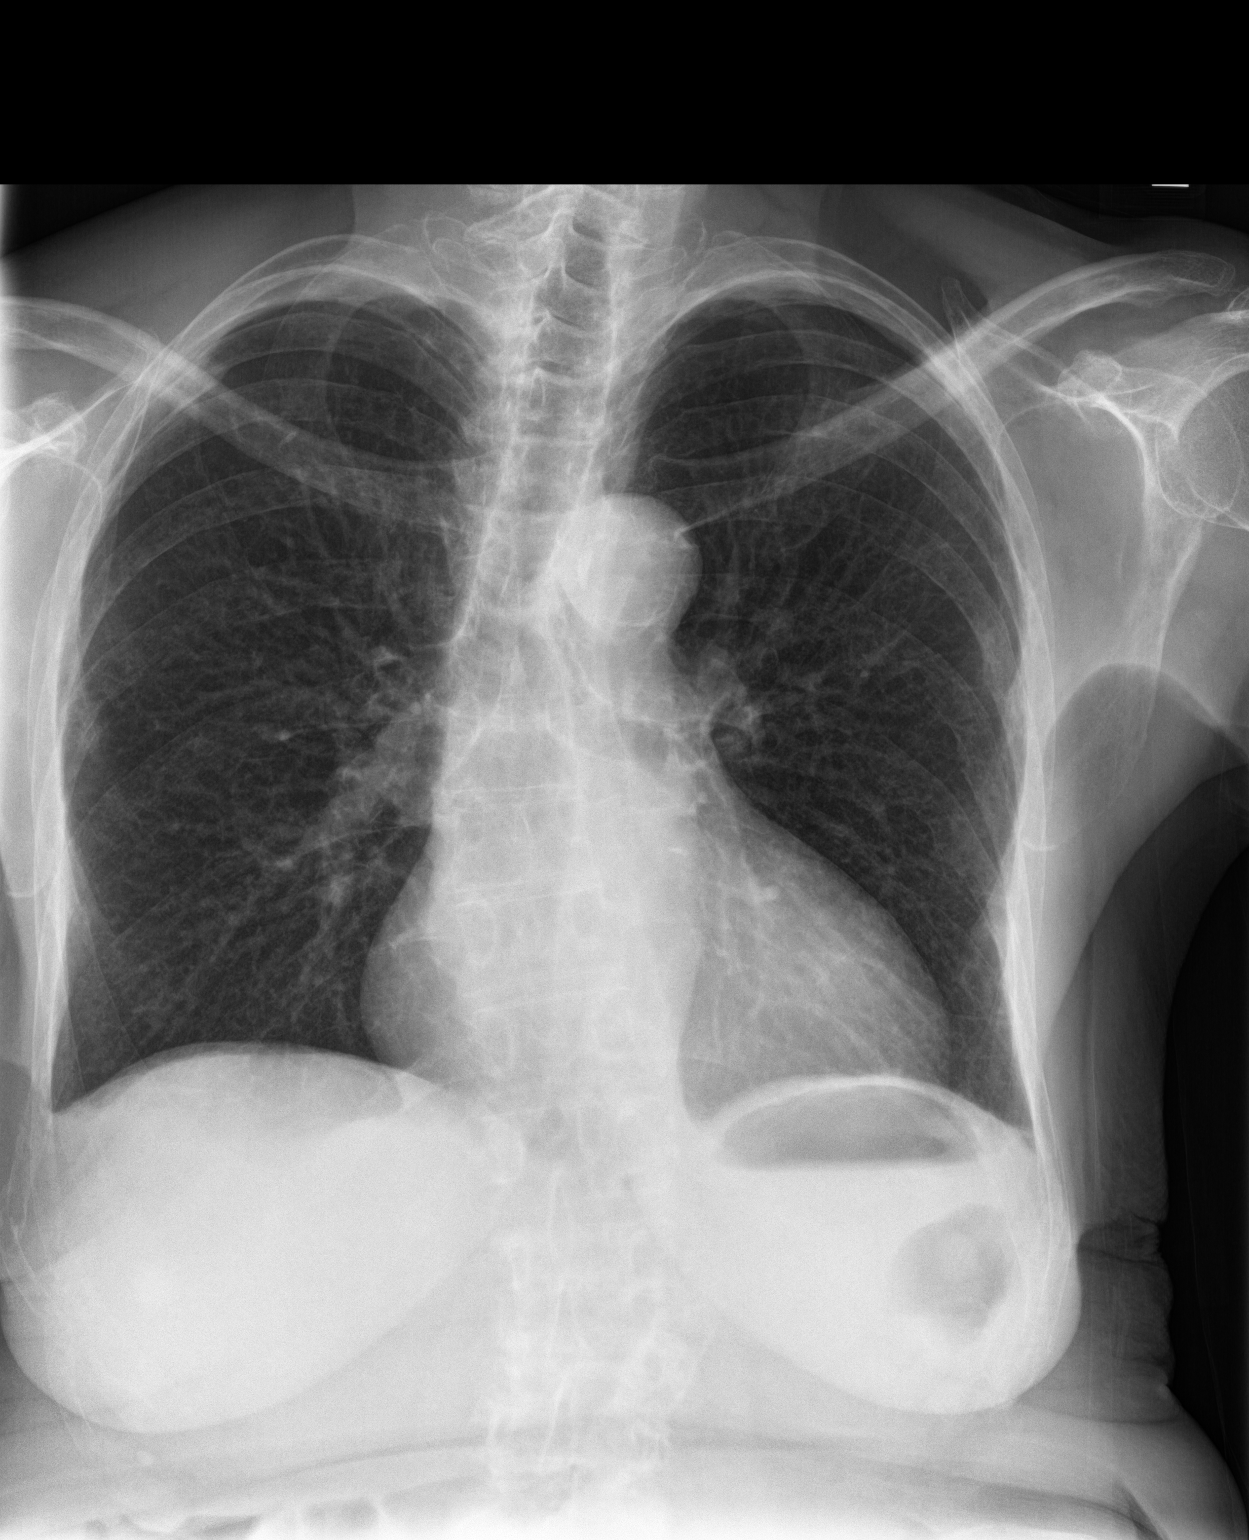

[chest lat]
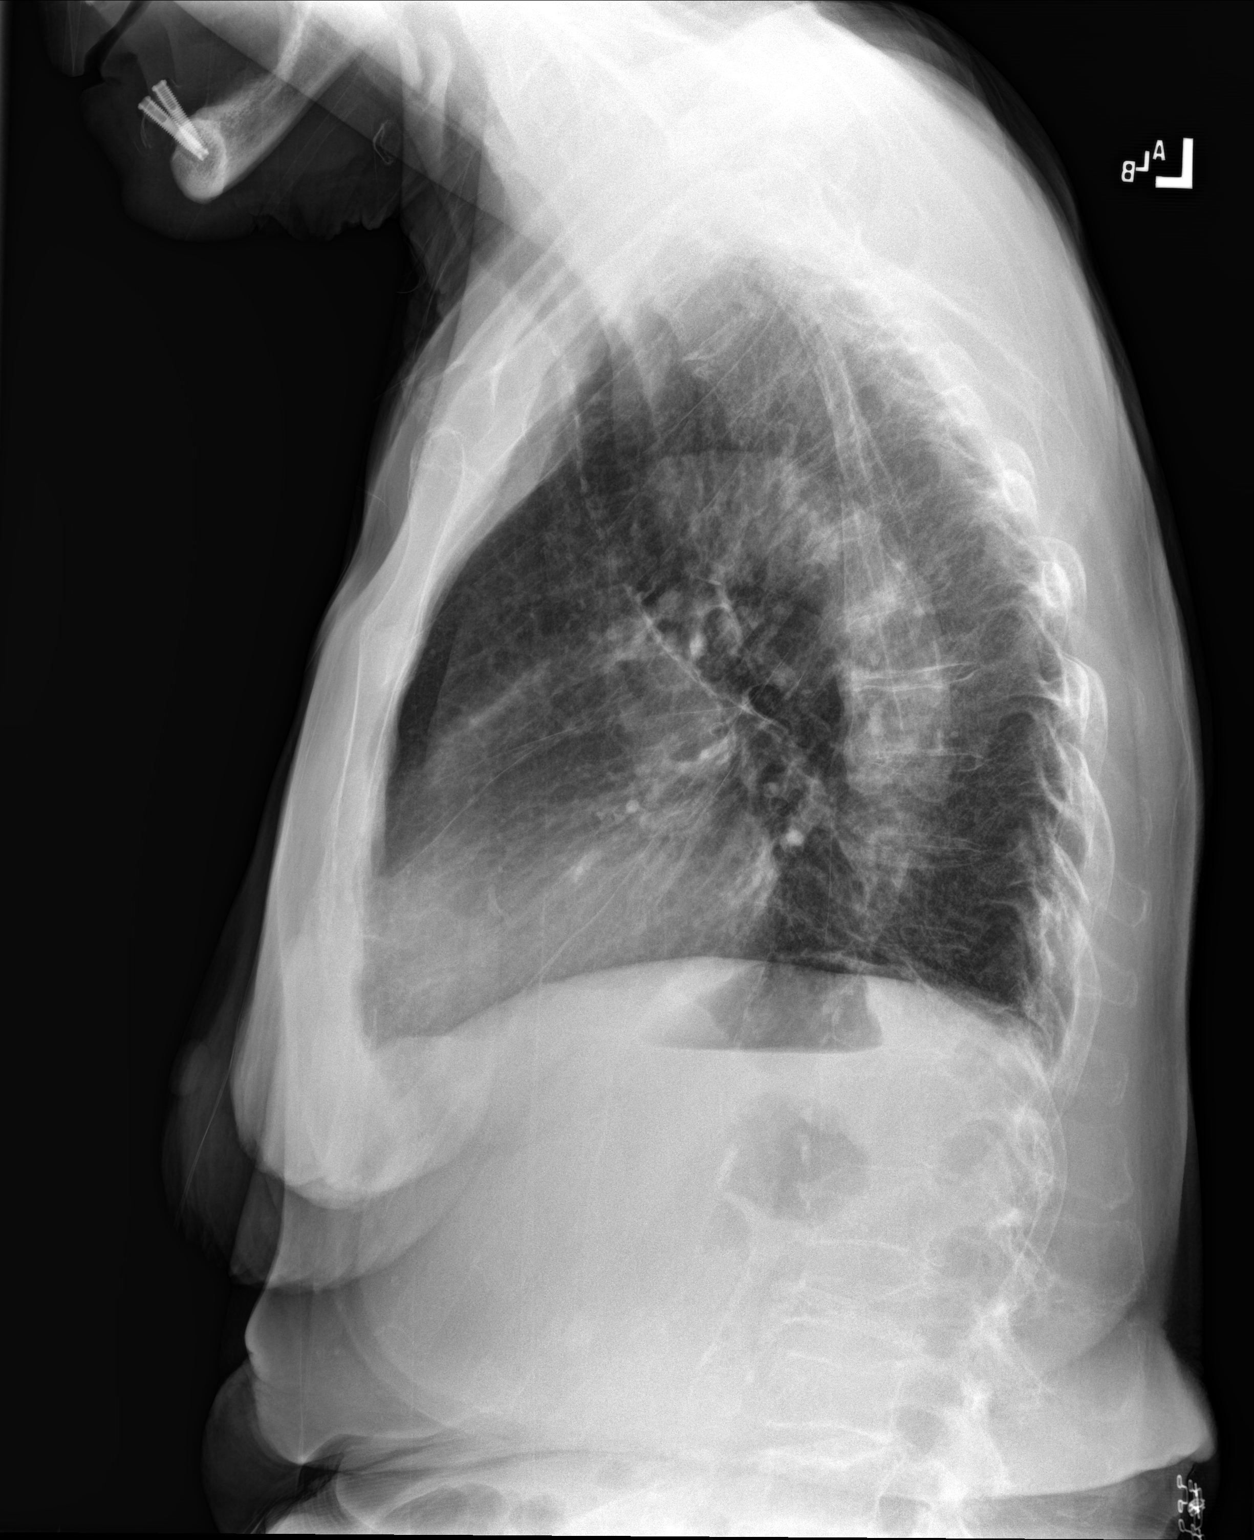

[2 of 2 positions shown; findings below may reference images not displayed]

FINDINGS: Heart size is upper limits of normal. Aortic atherosclerotic
calcification is noted. The lungs are clear. No pulmonary nodule is
seen. There appear to be old healed bilateral rib fractures. No
effusions. Mild chronic scoliotic curvature of the spine.
IMPRESSION: No active disease. Aortic atherosclerosis. Old healed bilateral rib
fractures.

## 2021-02-18 NOTE — Progress Notes (Signed)
Date:  02/18/2021   Name:  Cristina Morgan   DOB:  September 06, 1934   MRN:  [id number]   Chief Complaint: Follow-up (Check PTH and calcium)  Patient is a 86 year old female who presents for a abnormal weight loss exam. The patient reports the following problems: weight loss. Health maintenance has been reviewed up to date.     Lab Results  Component Value Date   NA 138 02/01/2021   K 4.3 02/01/2021   CO2 23 02/01/2021   GLUCOSE 178 (H) 02/01/2021   BUN 19 02/01/2021   CREATININE 0.84 02/01/2021   CALCIUM 10.7 (H) 02/01/2021   EGFR 68 02/01/2021   GFRNONAA 78 12/02/2019   Lab Results  Component Value Date   CHOL 176 07/24/2020   HDL 74 07/24/2020   LDLCALC 90 07/24/2020   TRIG 63 07/24/2020   CHOLHDL 2.7 03/01/2018   No results found for: TSH Lab Results  Component Value Date   HGBA1C 5.9 (H) 02/01/2021   No results found for: WBC, HGB, HCT, MCV, PLT Lab Results  Component Value Date   ALT 9 02/01/2021   AST 16 02/01/2021   ALKPHOS 102 02/01/2021   BILITOT 0.3 02/01/2021   No results found for: 25OHVITD2, 25OHVITD3, VD25OH   Review of Systems  Patient Active Problem List   Diagnosis Date Noted   Closed compression fracture of second lumbar vertebra ([place]) 10/28/2020   Need for vaccination against Streptococcus pneumoniae using pneumococcal conjugate vaccine 13 01/07/2017   Primary osteoarthritis of right knee 12/08/2016   PNA (pneumonia) 07/09/2015   Acute chest pain 03/20/2014   Type 2 diabetes mellitus ([place]) 03/20/2014   Hypercholesterolemia 03/20/2014   Essential hypertension 03/20/2014    Allergies  Allergen Reactions   Penicillins Other (See Comments)    Past Surgical History:  Procedure Laterality Date   CATARACT EXTRACTION Bilateral 2014   COLONOSCOPY  2011   cleared for 5 yrs- [facility]   ECTOPIC PREGNANCY SURGERY      Social History   Tobacco Use   Smoking status: Never   Smokeless tobacco: Never   Tobacco comments:    smoking cessation  materials not required  Vaping Use   Vaping Use: Never used  Substance Use Topics   Alcohol use: No    Alcohol/week: 0.0 standard drinks   Drug use: No     Medication list has been reviewed and updated.  Current Meds  Medication Sig   acetaminophen (TYLENOL) 325 MG tablet Take 650 mg by mouth every 6 (six) hours as needed.   aspirin 81 MG chewable tablet Chew 1 tablet (81 mg total) by mouth daily.   baclofen (LIORESAL) 20 MG tablet TAKE ONE TABLET BY MOUTH THREE TIMES DAILY   Calcium Carbonate-Vit D-Min (CALCIUM 1200 PO) Take 1 capsule by mouth daily.   cholecalciferol (VITAMIN D3) 25 MCG (1000 UNIT) tablet Take 1,000 Units by mouth daily.   hydrOXYzine (ATARAX/VISTARIL) 10 MG tablet Take 1 tablet (10 mg total) by mouth at bedtime as needed.   losartan (COZAAR) 50 MG tablet Take 1 tablet (50 mg total) by mouth daily.   lovastatin (MEVACOR) 40 MG tablet Take 1 tablet (40 mg total) by mouth daily.   meloxicam (MOBIC) 15 MG tablet Take 1 tablet (15 mg total) by mouth daily.   metFORMIN (GLUCOPHAGE) 1000 MG tablet Take 1 tablet (1,000 mg total) by mouth 2 (two) times daily.   Multiple Vitamin (MULTIVITAMIN ADULT PO) Take by mouth.   Omega-3  Fatty Acids (FISH OIL) 1000 MG CAPS Take 1 capsule (1,000 mg total) by mouth daily.   pantoprazole (PROTONIX) 40 MG tablet Take 1 tablet (40 mg total) by mouth daily.   sertraline (ZOLOFT) 25 MG tablet TAKE ONE TABLET BY MOUTH ONCE DAILY    PHQ 2/9 Scores 02/01/2021 12/12/2020 11/07/2020 07/24/2020  PHQ - 2 Score 0 0 0 0  PHQ- 9 Score 2 4 - 0    GAD 7 : Generalized Anxiety Score 02/01/2021 12/12/2020 07/24/2020 04/09/2020  Nervous, Anxious, on Edge 0 0 1 0  Control/stop worrying 0 0 0 0  Worry too much - different things 0 0 0 0  Trouble relaxing 1 0 0 0  Restless - 0 0 0  Easily annoyed or irritable 0 0 0 0  Afraid - awful might happen 0 0 0 0  Total GAD 7 Score - 0 1 0  Anxiety Difficulty - - Not difficult at all Not difficult at all    BP  Readings from Last 3 Encounters:  02/18/21 130/70  02/01/21 130/70  12/12/20 130/62    Physical Exam  Wt Readings from Last 3 Encounters:  02/18/21 131 lb (59.4 kg)  02/01/21 135 lb 12.8 oz (61.6 kg)  12/12/20 141 lb (64 kg)    BP 130/70   Pulse 88   Ht '5\' 7"'  (1.702 m)   Wt 131 lb (59.4 kg)   BMI 20.52 kg/m   Assessment and Plan:  1. Weight loss Patient continues with unintentional weight loss with further review of the following concerns below. - DG Chest 2 View; Future - US Abdomen Complete; Future - US Pelvis Complete; Future - CBC w/Diff/Platelet - US Pelvic Complete With Transvaginal; Future  2. Acute low back pain without sciatica, unspecified back pain laterality Patient is followed at [facility] with an acute back pain of which I have reviewed the MRI and did not see any evidence of any osteolytic lesions to suggest metastases.  3. Dyspnea on exertion Patient has had some dyspnea on exertion and review of a CT of the chest noted there is an abnormality of a pulmonary nodule and an area that is laying on top of the diaphragm not associated with the pericardium that needs further evaluation.  We will start with a chest x-ray to see if there is any pulmonary issues or adenopathy at this time. - DG Chest 2 View; Future  4. Hypercalcemia Patient does have an elevated calcium as well as a decreased phosphorus.  We will obtain a parathyroid hormone to evaluate for hyperparathyroidism. - DG Chest 2 View; Future - US Abdomen Complete; Future - US Pelvis Complete; Future  5. Pulmonary nodule, right On review of the CT scan that was done at [facility] several years ago there is a 0.4 nodule that was noted right anterior hemidiaphragm which is described as a "fluid attenuation elongated structure" we will obtain an ultrasound of the complete abdomen and a transvaginal ultrasound of the pelvis for further evaluation. - DG Chest 2 View; Future - PTH, Intact and Calcium  6. Abnormal  findings on diagnostic imaging of other abdominal regions, including retroperitoneum As noted above there is a abnormal CT of the chest which includes adjacent hemidiaphragm.  Patient also has an elevated lipase that we will further evaluate with ultrasound of the pelvis and abdomen. - US Pelvis Complete; Future - US Pelvic Complete With Transvaginal; Future  7. Abnormal chest CT As noted above

## 2021-02-19 LAB — PTH, INTACT AND CALCIUM
Calcium: 11.1 mg/dL — ABNORMAL HIGH (ref 8.7–10.3)
PTH: 24 pg/mL (ref 15–65)

## 2021-02-19 LAB — CBC WITH DIFFERENTIAL/PLATELET
Basophils Absolute: 0 10*3/uL (ref 0.0–0.2)
Basos: 0 %
EOS (ABSOLUTE): 0 10*3/uL (ref 0.0–0.4)
Eos: 0 %
Hematocrit: 31.8 % — ABNORMAL LOW (ref 34.0–46.6)
Hemoglobin: 10.3 g/dL — ABNORMAL LOW (ref 11.1–15.9)
Immature Grans (Abs): 0 10*3/uL (ref 0.0–0.1)
Immature Granulocytes: 0 %
Lymphocytes Absolute: 2 10*3/uL (ref 0.7–3.1)
Lymphs: 40 %
MCH: 31.7 pg (ref 26.6–33.0)
MCHC: 32.4 g/dL (ref 31.5–35.7)
MCV: 98 fL — ABNORMAL HIGH (ref 79–97)
Monocytes Absolute: 0.5 10*3/uL (ref 0.1–0.9)
Monocytes: 10 %
Neutrophils Absolute: 2.5 10*3/uL (ref 1.4–7.0)
Neutrophils: 50 %
Platelets: 313 10*3/uL (ref 150–450)
RBC: 3.25 x10E6/uL — ABNORMAL LOW (ref 3.77–5.28)
RDW: 12.4 % (ref 11.7–15.4)
WBC: 5 10*3/uL (ref 3.4–10.8)

## 2021-02-20 ENCOUNTER — Other Ambulatory Visit: Payer: Self-pay | Admitting: Family Medicine

## 2021-02-20 DIAGNOSIS — M545 Low back pain, unspecified: Secondary | ICD-10-CM

## 2021-02-20 DIAGNOSIS — E7800 Pure hypercholesterolemia, unspecified: Secondary | ICD-10-CM

## 2021-02-20 DIAGNOSIS — I10 Essential (primary) hypertension: Secondary | ICD-10-CM

## 2021-02-20 DIAGNOSIS — S32000D Wedge compression fracture of unspecified lumbar vertebra, subsequent encounter for fracture with routine healing: Secondary | ICD-10-CM

## 2021-02-20 DIAGNOSIS — E119 Type 2 diabetes mellitus without complications: Secondary | ICD-10-CM

## 2021-02-20 NOTE — Telephone Encounter (Signed)
Requested Prescriptions  Pending Prescriptions Disp Refills  . baclofen (LIORESAL) 20 MG tablet [Pharmacy Med Name: baclofen 20 mg tablet] 30 tablet 0    Sig: TAKE ONE TABLET BY MOUTH THREE times daily     Not Delegated - Analgesics:  Muscle Relaxants Failed - 02/20/2021 11:27 AM      Failed - This refill cannot be delegated      Passed - Valid encounter within last 6 months    Recent Outpatient Visits          2 days ago Weight loss   [facility] Juline Patch, [facility]   2 weeks ago Type 2 diabetes mellitus without complication, without long-term current use of insulin ([place])   [facility] Juline Patch, MD   2 months ago Type 2 diabetes mellitus without complication, without long-term current use of insulin ([place])   [facility] Juline Patch, MD   3 months ago Acute low back pain without sciatica, unspecified back pain laterality   [facility], [facility]   7 months ago Type 2 diabetes mellitus without complication, without long-term current use of insulin ([place])   [facility] Juline Patch, MD      Future Appointments            In 2 days Juline Patch, MD [facility], [place]   In 1 month Juline Patch, MD [facility]           . losartan (COZAAR) 25 MG tablet [Pharmacy Med Name: losartan 25 mg tablet] 90 tablet 0    Sig: TAKE ONE TABLET BY MOUTH EVERY EVENING     Cardiovascular:  Angiotensin Receptor Blockers Passed - 02/20/2021 11:27 AM      Passed - Cr in normal range and within 180 days    Creatinine, Ser  Date Value Ref Range Status  02/01/2021 0.84 0.57 - 1.00 mg/dL Final         Passed - K in normal range and within 180 days    Potassium  Date Value Ref Range Status  02/01/2021 4.3 3.5 - 5.2 mmol/L Final         Passed - Patient is not pregnant      Passed - Last BP in normal range    BP Readings from Last 1 Encounters:  02/18/21 130/70         Passed - Valid encounter  within last 6 months    Recent Outpatient Visits          2 days ago Weight loss   [facility] Juline [facility], MD   2 weeks ago Type 2 diabetes mellitus without complication, without long-term current use of insulin ([place])   [facility]ine [facility], MD   2 months ago Type 2 diabetes mellitus without complication, without long-term current use of insulin ([place])   [facility] Juline Patch, MD   3 months ago Acute low back pain without sciatica, unspecified back pain laterality   [facility] Juline Patch, MD   7 months ago Type 2 diabetes mellitus without complication, without long-term current use of insulin ([place])   [facility] Juline Patch, MD      Future Appointments            In 2 days Juline Patch, MD [facility], [place]   In 1 month Ronnald Ramp, Deanna C,  MD [facility], [facility]           . lovastatin (MEVACOR) 40 MG tablet [Pharmacy Med Name: lovastatin 40 mg tablet] 90 tablet 0    Sig: TAKE ONE TABLET BY MOUTH EVERY EVENING     Cardiovascular:  Antilipid - Statins Passed - 02/20/2021 11:27 AM      Passed - Total Cholesterol in normal range and within 360 days    Cholesterol, Total  Date Value Ref Range Status  07/24/2020 176 100 - 199 mg/dL Final         Passed - LDL in normal range and within 360 days    LDL Chol Calc (NIH)  Date Value Ref Range Status  07/24/2020 90 0 - 99 mg/dL Final         Passed - HDL in normal range and within 360 days    HDL  Date Value Ref Range Status  07/24/2020 74 >39 mg/dL Final         Passed - Triglycerides in normal range and within 360 days    Triglycerides  Date Value Ref Range Status  07/24/2020 63 0 - 149 mg/dL Final         Passed - Patient is not pregnant      Passed - Valid encounter within last 12 months    Recent Outpatient Visits          2 days ago Weight loss   [facility], [facility]   2 weeks ago Type 2 diabetes  mellitus without complication, without long-term current use of insulin ([place])   [facility], MD   2 months ago Type 2 diabetes mellitus without complication, without long-term current use of insulin ([place])   [facility] Juline Patch, MD   3 months ago Acute low back pain without sciatica, unspecified back pain laterality   [facility] Juline [facility], MD   7 months ago Type 2 diabetes mellitus without complication, without long-term current use of insulin ([place])   [facility] Juline Patch, MD      Future Appointments            In 2 days Juline Patch, MD [facility], [place]   In 1 month Juline Patch, MD [facility]           . metFORMIN (GLUCOPHAGE) 1000 MG tablet [Pharmacy Med Name: metformin 1,000 mg tablet] 180 tablet 0    Sig: TAKE ONE TABLET BY MOUTH TWICE DAILY     Endocrinology:  Diabetes - Biguanides Failed - 02/20/2021 11:27 AM      Failed - AA eGFR in normal range and within 360 days    GFR calc Af Amer  Date Value Ref Range Status  12/02/2019 90 >59 mL/min/1.73 Final    Comment:    **[facility] currently reports eGFR in compliance with the current**   recommendations of the [employer]. [facility] will   update reporting as new guidelines are published from the NKF-ASN   Task force.    GFR calc non Af Amer  Date Value Ref Range Status  12/02/2019 78 >59 mL/min/1.73 Final   eGFR  Date Value Ref Range Status  02/01/2021 68 >59 mL/min/1.73 Final         Passed - Cr in normal range and within 360 days    Creatinine, Ser  Date Value Ref Range Status  02/01/2021 0.84 0.57 -  1.00 mg/dL Final         Passed - HBA1C is between 0 and 7.9 and within 180 days    Hgb A1c MFr Bld  Date Value Ref Range Status  02/01/2021 5.9 (H) 4.8 - 5.6 % Final    Comment:             Prediabetes: 5.7 - 6.4          Diabetes: >6.4          Glycemic control for adults with diabetes:  <7.0          Passed - Valid encounter within last 6 months    Recent Outpatient Visits          2 days ago Weight loss   [facility], [facility]   2 weeks ago Type 2 diabetes mellitus without complication, without long-term current use of insulin ([place])   [facility], MD   2 months ago Type 2 diabetes mellitus without complication, without long-term current use of insulin ([place])   [facility] Juline Patch, MD   3 months ago Acute low back pain without sciatica, unspecified back pain laterality   [facility] Juline Patch, MD   7 months ago Type 2 diabetes mellitus without complication, without long-term current use of insulin ([place])   [facility] Juline Patch, MD      Future Appointments            In 2 days Juline Patch, MD [facility], [place]   In 1 month Juline Patch, MD [facility], [facility]

## 2021-02-20 NOTE — Telephone Encounter (Signed)
Requested medication (s) are due for refill today: losartan too soon , baclofen yes   Requested medication (s) are on the active medication list: yes   Last refill:  baclofen-12/20/20 #30 0 refills , losartan- 12/14/20 #30 0 refills   Future visit scheduled: yes in 2 days   Notes to clinic:  not delegated per protocol- balofen. Unable to refuse losartan.      Requested Prescriptions  Pending Prescriptions Disp Refills   baclofen (LIORESAL) 20 MG tablet [Pharmacy Med Name: baclofen 20 mg tablet] 30 tablet 0    Sig: TAKE ONE TABLET BY MOUTH THREE times daily     Not Delegated - Analgesics:  Muscle Relaxants Failed - 02/20/2021 11:27 AM      Failed - This refill cannot be delegated      Passed - Valid encounter within last 6 months    Recent Outpatient Visits           2 days ago Weight loss   [facility], [facility]   2 weeks ago Type 2 diabetes mellitus without complication, without long-term current use of insulin ([place])   [facility], [facility]   2 months ago Type 2 diabetes mellitus without complication, without long-term current use of insulin ([place])   [facility] Jul[facility], MD   3 months ago Acute low back pain without sciatica, unspecified back pain laterality   [facility] Juline Patch, MD   7 months ago Type 2 diabetes mellitus without complication, without long-term current use of insulin ([place])   [facility] Juline Patch, MD       Future Appointments             In 2 days Juline Patch, MD [facility], [place]   In 1 month Juline Patch, MD [facility]             losartan (COZAAR) 25 MG tablet [Pharmacy Med Name: losartan 25 mg tablet] 90 tablet 0    Sig: TAKE ONE TABLET BY MOUTH EVERY EVENING     Cardiovascular:  Angiotensin Receptor Blockers Passed - 02/20/2021 11:27 AM      Passed - Cr in normal range and within 180 days    Creatinine, Ser  Date Value  Ref Range Status  02/01/2021 0.84 0.57 - 1.00 mg/dL Final          Passed - K in normal range and within 180 days    Potassium  Date Value Ref Range Status  02/01/2021 4.3 3.5 - 5.2 mmol/L Final          Passed - Patient is not pregnant      Passed - Last BP in normal range    BP Readings from Last 1 Encounters:  02/18/21 130/70          Passed - Valid encounter within last 6 months    Recent Outpatient Visits           2 days ago Weight loss   [facility], [facility]   2 weeks ago Type 2 diabetes mellitus without complication, without long-term current use of insulin ([place])   [facility] Juline Patch, MD   2 months ago Type 2 diabetes mellitus without complication, without long-term current use of insulin ([place])   [facility] Juline Patch, MD   3 months ago Acute low back pain without  sciatica, unspecified back pain laterality   [facility] Juline [facility], MD   7 months ago Type 2 diabetes mellitus without complication, without long-term current use of insulin ([place])   [facility] Juline [facility], MD       Future Appointments             In 2 days Juline Patch, MD [facility], [place]   In 1 month Juline Patch, MD [facility]            Signed Prescriptions Disp Refills   lovastatin (MEVACOR) 40 MG tablet 90 tablet 0    Sig: TAKE ONE TABLET BY MOUTH EVERY EVENING     Cardiovascular:  Antilipid - Statins Passed - 02/20/2021 11:27 AM      Passed - Total Cholesterol in normal range and within 360 days    Cholesterol, Total  Date Value Ref Range Status  07/24/2020 176 100 - 199 mg/dL Final          Passed - LDL in normal range and within 360 days    LDL Chol Calc (NIH)  Date Value Ref Range Status  07/24/2020 90 0 - 99 mg/dL Final          Passed - HDL in normal range and within 360 days    HDL  Date Value Ref Range Status  07/24/2020 74 >39 mg/dL Final           Passed - Triglycerides in normal range and within 360 days    Triglycerides  Date Value Ref Range Status  07/24/2020 63 0 - 149 mg/dL Final          Passed - Patient is not pregnant      Passed - Valid encounter within last 12 months    Recent Outpatient Visits           2 days ago Weight loss   [facility], [facility]   2 weeks ago Type 2 diabetes mellitus without complication, without long-term current use of insulin ([place])   [facility]   2 months ago Type 2 diabetes mellitus without complication, without long-term current use of insulin ([place])   [facility] Juline [facility], MD   3 months ago Acute low back pain without sciatica, unspecified back pain laterality   [facility] Juline Patch, MD   7 months ago Type 2 diabetes mellitus without complication, without long-term current use of insulin ([place])   [facility] Juline Patch, MD       Future Appointments             In 2 days Juline Patch, MD [facility], [place]   In 1 month Juline Patch, MD [facility]             metFORMIN (GLUCOPHAGE) 1000 MG tablet 180 tablet 0    Sig: TAKE ONE TABLET BY MOUTH TWICE DAILY     Endocrinology:  Diabetes - Biguanides Failed - 02/20/2021 11:27 AM      Failed - AA eGFR in normal range and within 360 days    GFR calc Af Amer  Date Value Ref Range Status  12/02/2019 90 >59 mL/min/1.73 Final    Comment:    **[facility] currently reports eGFR in compliance with the current**   recommendations of the [employer]. [facility] will   update reporting  as new guidelines are published from the NKF-ASN   Task force.    GFR calc non Af Amer  Date Value Ref Range Status  12/02/2019 78 >59 mL/min/1.73 Final   eGFR  Date Value Ref Range Status  02/01/2021 68 >59 mL/min/1.73 Final          Passed - Cr in normal range and within 360 days    Creatinine, Ser  Date  Value Ref Range Status  02/01/2021 0.84 0.57 - 1.00 mg/dL Final          Passed - HBA1C is between 0 and 7.9 and within 180 days    Hgb A1c MFr Bld  Date Value Ref Range Status  02/01/2021 5.9 (H) 4.8 - 5.6 % Final    Comment:             Prediabetes: 5.7 - 6.4          Diabetes: >6.4          Glycemic control for adults with diabetes: <7.0           Passed - Valid encounter within last 6 months    Recent Outpatient Visits           2 days ago Weight loss   [facility] Jul[facility] Patch, MD   2 weeks ago Type 2 diabetes mellitus without complication, without long-term current use of insulin ([place])   [facility] Juline Patch, MD   2 months ago Type 2 diabetes mellitus without complication, without long-term current use of insulin ([place])   [facility] Juline Patch, MD   3 months ago Acute low back pain without sciatica, unspecified back pain laterality   [facility] Juline Patch, MD   7 months ago Type 2 diabetes mellitus without complication, without long-term current use of insulin ([place])   [facility] Juline Patch, MD       Future Appointments             In 2 days Juline Patch, MD [facility], [place]   In 1 month Juline Patch, MD [facility], [facility]

## 2021-02-21 ENCOUNTER — Encounter: Payer: Self-pay | Admitting: Gastroenterology

## 2021-02-21 ENCOUNTER — Other Ambulatory Visit: Payer: Self-pay

## 2021-02-21 ENCOUNTER — Ambulatory Visit (INDEPENDENT_AMBULATORY_CARE_PROVIDER_SITE_OTHER): Payer: Medicare Other | Admitting: Gastroenterology

## 2021-02-21 ENCOUNTER — Ambulatory Visit
Admission: RE | Admit: 2021-02-21 | Discharge: 2021-02-21 | Disposition: A | Discharge status: Home / Self Care | Payer: Medicare Other | Source: Ambulatory Visit | Attending: Family Medicine | Admitting: Family Medicine

## 2021-02-21 VITALS — BP 142/61 | HR 105 | Temp 97.7°F | Ht 67.0 in | Wt 135.0 lb

## 2021-02-21 DIAGNOSIS — R634 Abnormal weight loss: Secondary | ICD-10-CM | POA: Insufficient documentation

## 2021-02-21 DIAGNOSIS — R935 Abnormal findings on diagnostic imaging of other abdominal regions, including retroperitoneum: Secondary | ICD-10-CM | POA: Diagnosis not present

## 2021-02-21 DIAGNOSIS — N83202 Unspecified ovarian cyst, left side: Secondary | ICD-10-CM | POA: Diagnosis not present

## 2021-02-21 DIAGNOSIS — R748 Abnormal levels of other serum enzymes: Secondary | ICD-10-CM | POA: Diagnosis not present

## 2021-02-21 DIAGNOSIS — K219 Gastro-esophageal reflux disease without esophagitis: Secondary | ICD-10-CM | POA: Diagnosis not present

## 2021-02-21 DIAGNOSIS — N1330 Unspecified hydronephrosis: Secondary | ICD-10-CM | POA: Diagnosis not present

## 2021-02-21 DIAGNOSIS — D251 Intramural leiomyoma of uterus: Secondary | ICD-10-CM | POA: Diagnosis not present

## 2021-02-21 IMAGING — US US PELVIS COMPLETE WITH TRANSVAGINAL
1 series · 14 of 25 positions shown · non-contrast
Comparison: None available.

CLINICAL DATA: Initial evaluation for weight loss, abnormal CT.



[Series 1: us abdomen complete · 59 acquisitions, 14 frames shown]
[im 1/59]
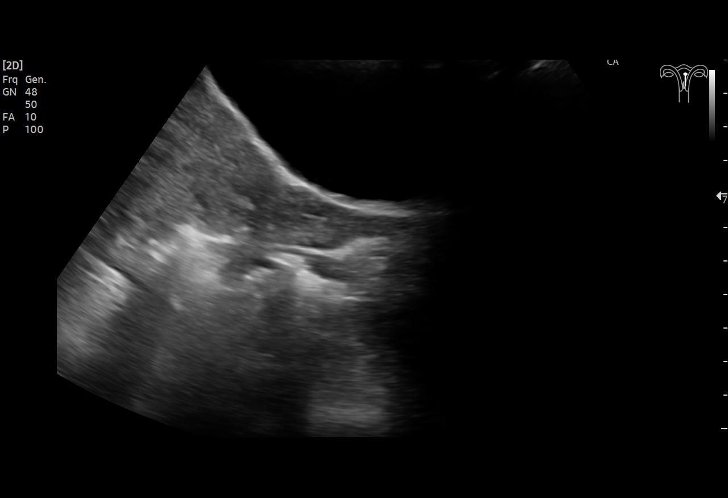
[im 5/59]
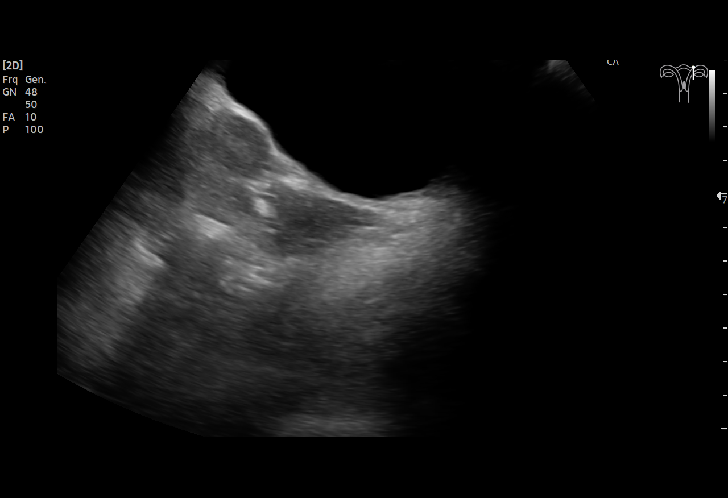
[im 10/59]
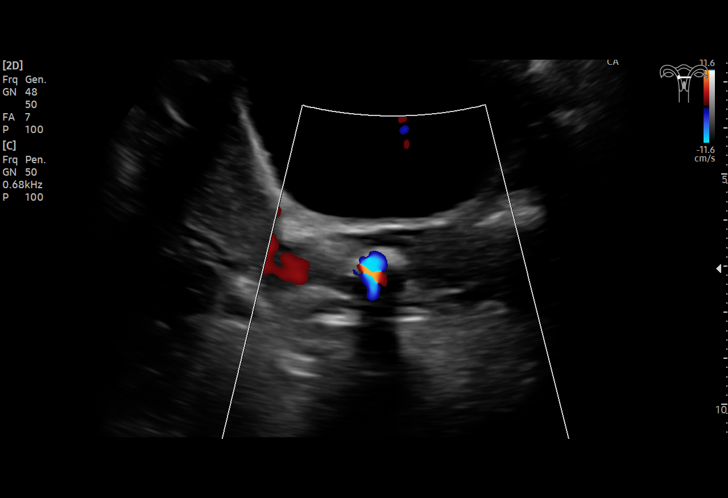
[im 15/59]
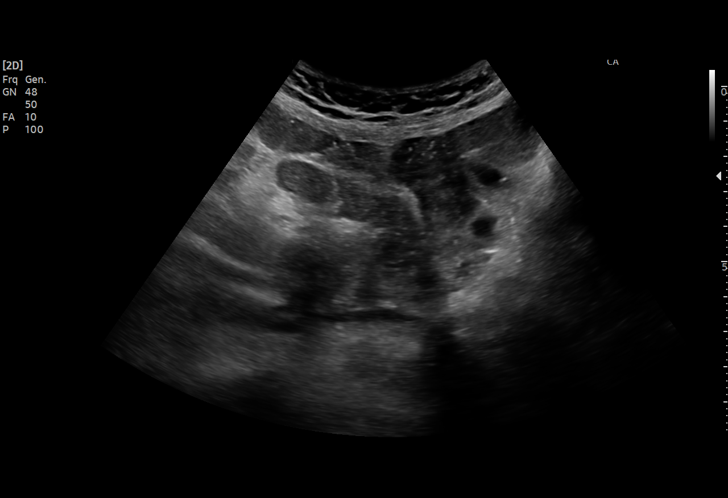
[im 20/59]
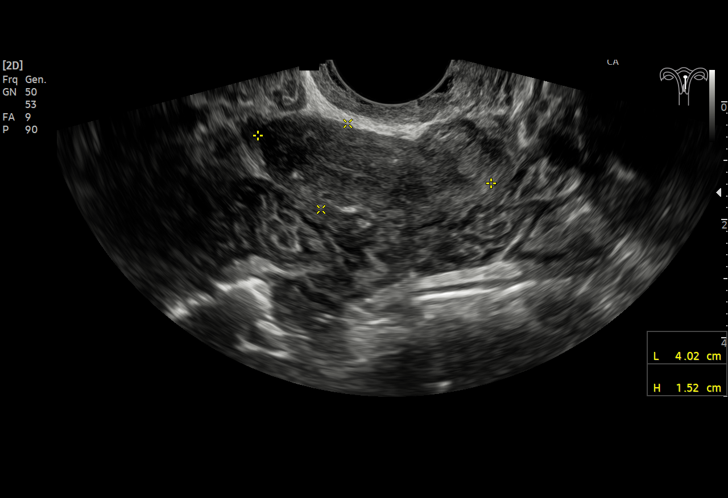
[im 22/59]
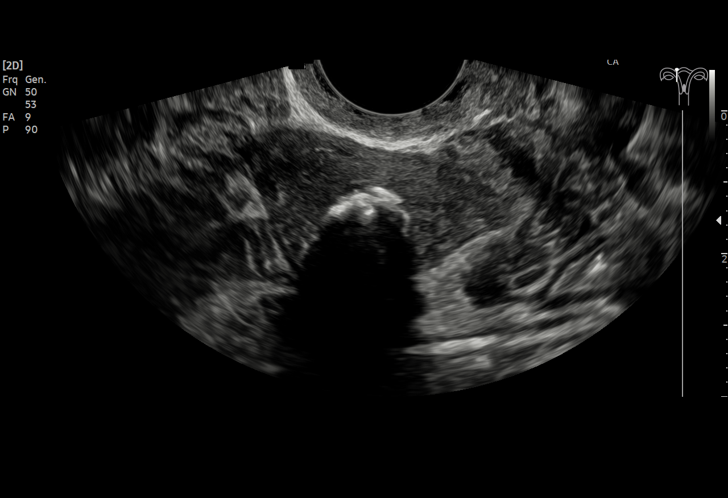
[im 27/59]
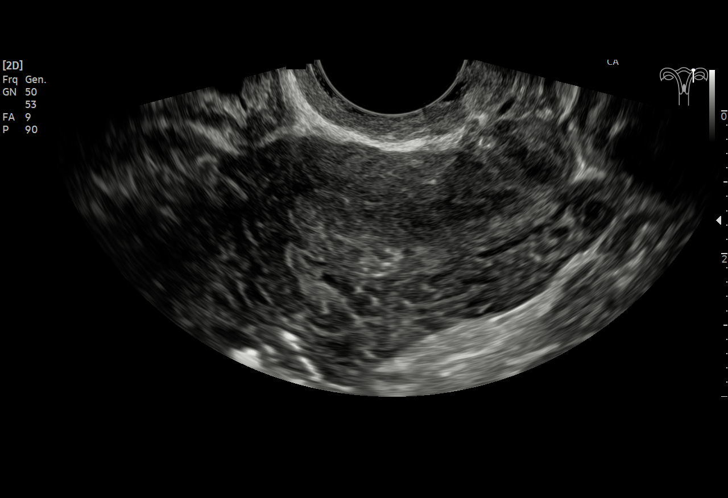
[im 32/59]
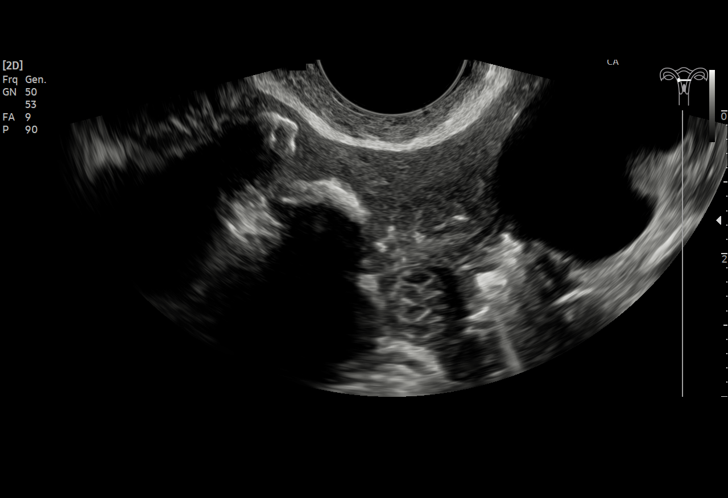
[im 37/59]
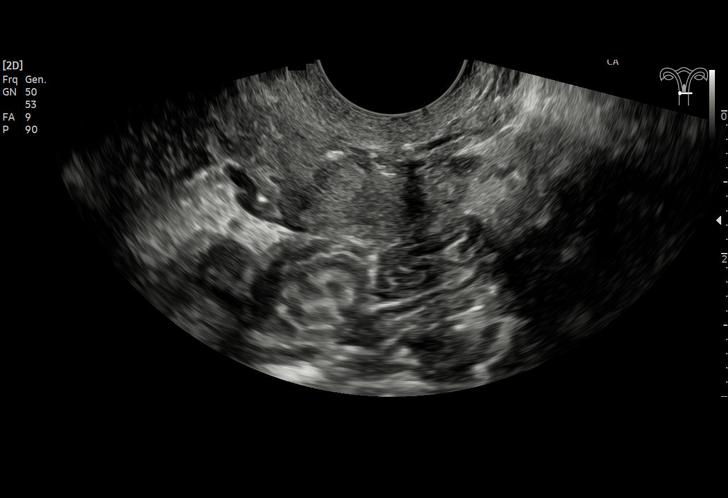
[im 39/59]
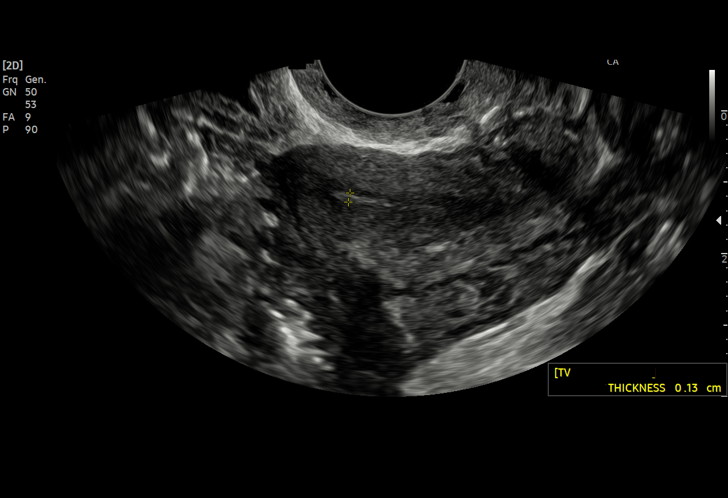
[im 44/59]
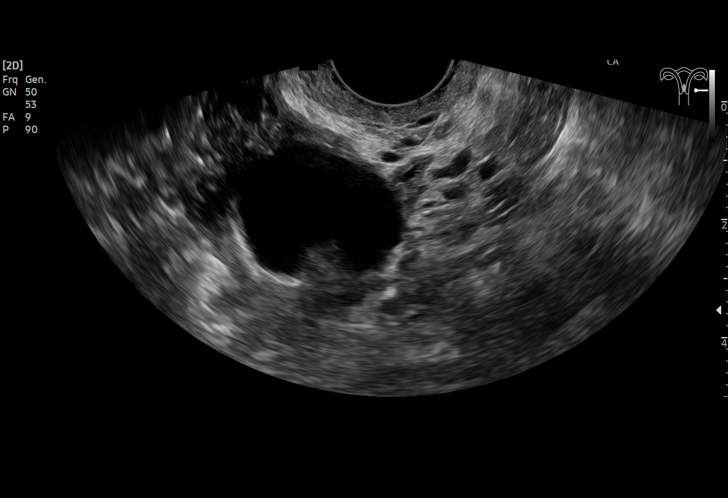
[im 49/59]
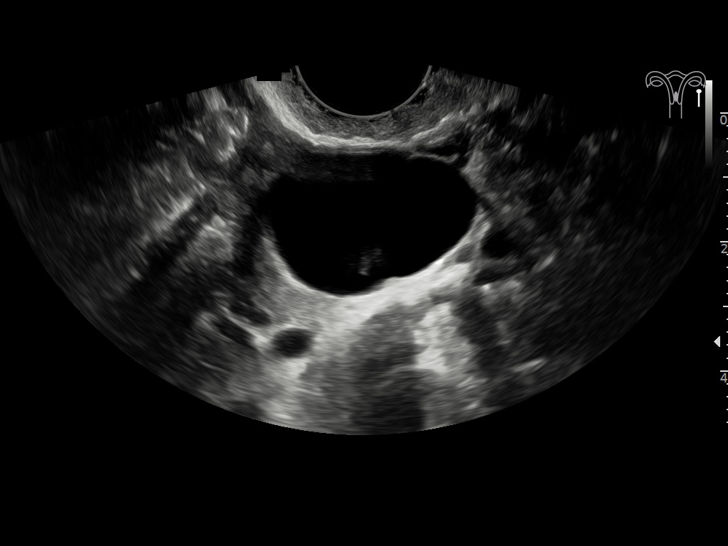
[im 54/59]
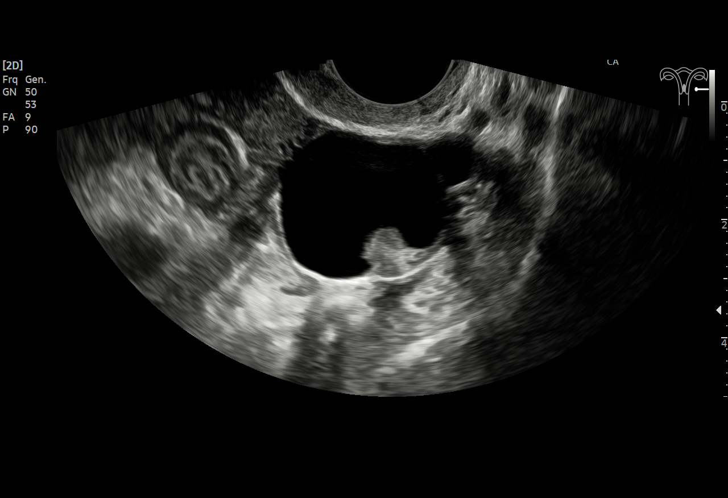
[im 59/59]
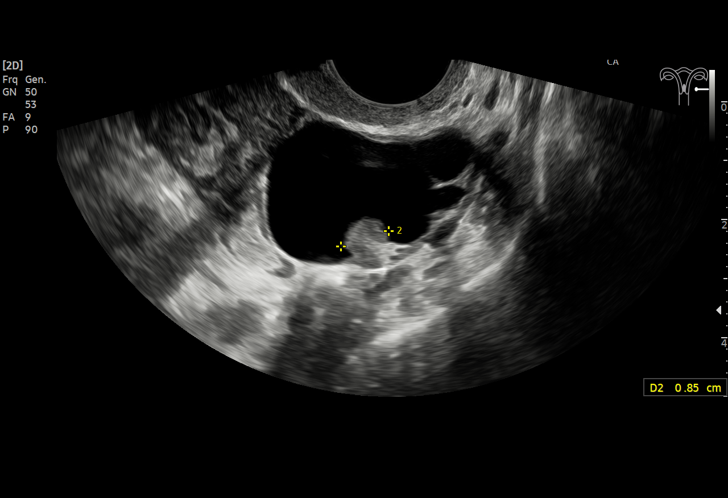

[14 of 25 positions shown; findings below may reference images not displayed]

FINDINGS: Uterus

Measurements: 4.0 x 1.5 x 3.0 cm = volume: 9.8 mL. Uterus is
anteverted. 1.6 x 1.0 x 1.4 cm partially calcified intramural
fibroid present at the right uterine body. An additional calcified
intramural fibroid measuring 0.6 x 0.8 x 0.8 cm present at the right
uterine fundus.

Endometrium

Thickness: 1.3 mm.  No focal abnormality visualized.

Right ovary

Not visualized.  No adnexal mass.

Left ovary

Measurements: 3.4 x 2.5 x 3.5 cm = volume: 15.6 mL. 3.2 x 2.2 x
cm complex cyst seen arising from the left ovary. Internal mural
solid nodule measures 0.8 x 0.6 x 0.9 cm. Mild mural irregularity
noted elsewhere about this lesion. No significant vascularity on
cine Doppler imaging. Cyst otherwise demonstrates a predominantly
anechoic echotexture.

Other findings

No abnormal free fluid.
IMPRESSION: 1. 3.2 x 2.2 x 3.4 cm complex left ovarian cyst with internal solid
mural nodule, concerning for a possible cystic ovarian neoplasm.
Gynecologic referral for further workup and surgical consultation
recommended. Further evaluation with dedicated pelvic MRI, with and
without contrast, may be helpful for further evaluation as
clinically warranted.
2. No free fluid.
3. Nonvisualization of the right ovary. No other adnexal mass.
4. Two calcified intramural fibroids measuring up to 1.6 cm as
above.
5. Normal postmenopausal endometrium.

## 2021-02-21 IMAGING — US US ABDOMEN COMPLETE
1 series · 15 of 25 positions shown · non-contrast
Comparison: None available.

CLINICAL DATA: Initial evaluation for weight loss, elevated lipase,
hypercalcemia.

EXAM:
ABDOMEN ULTRASOUND COMPLETE

[Series 1: us abdomen complete · 15 of 89 slices shown]
[im 1/89]
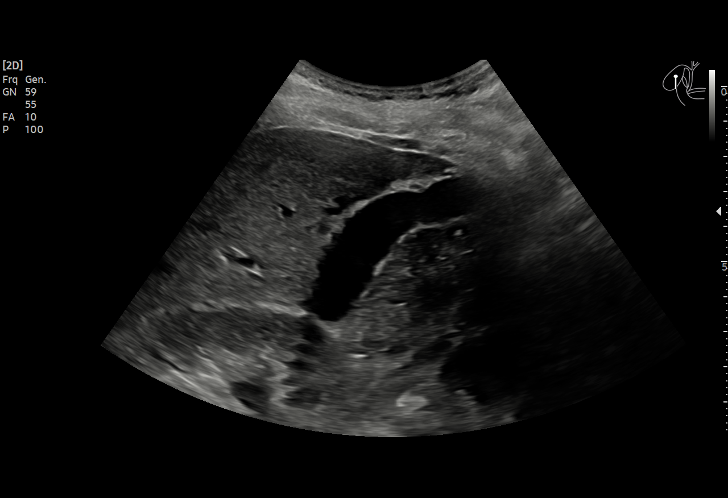
[im 8/89]
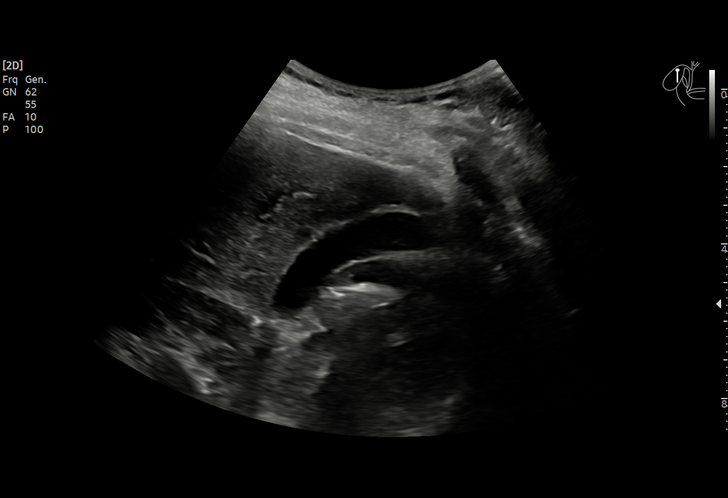
[im 15/89]
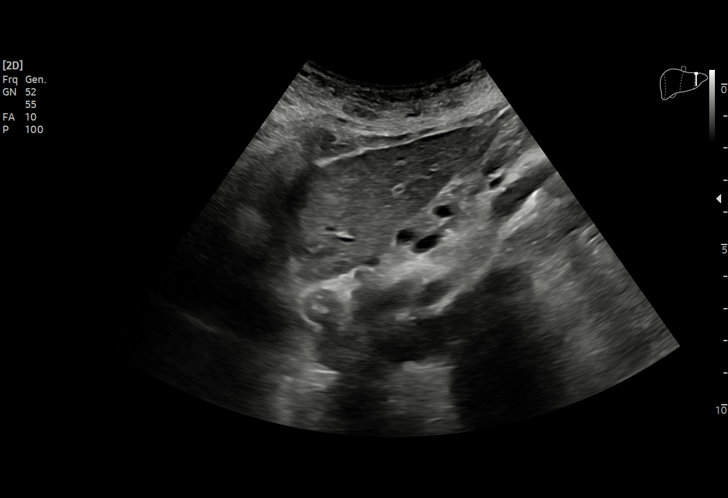
[im 19/89]
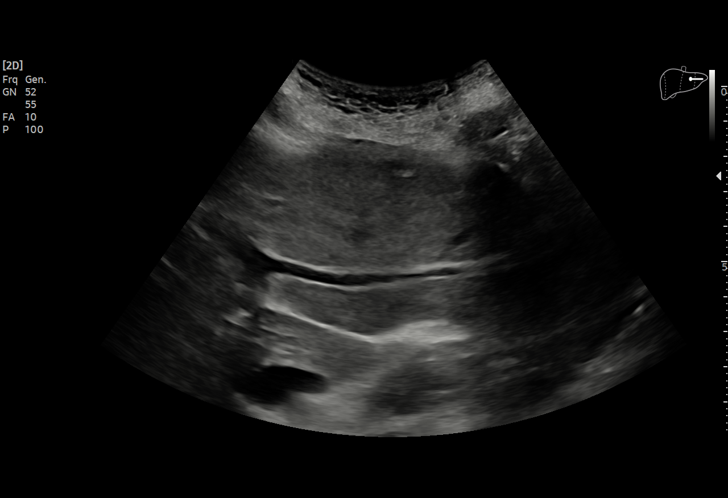
[im 26/89]
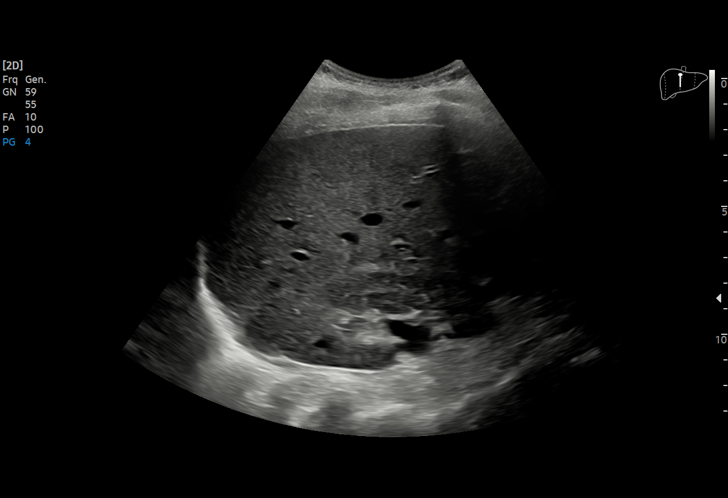
[im 34/89]
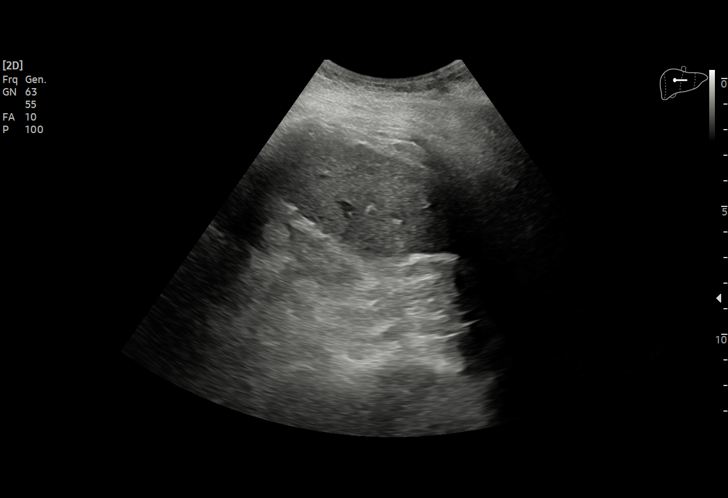
[im 37/89]
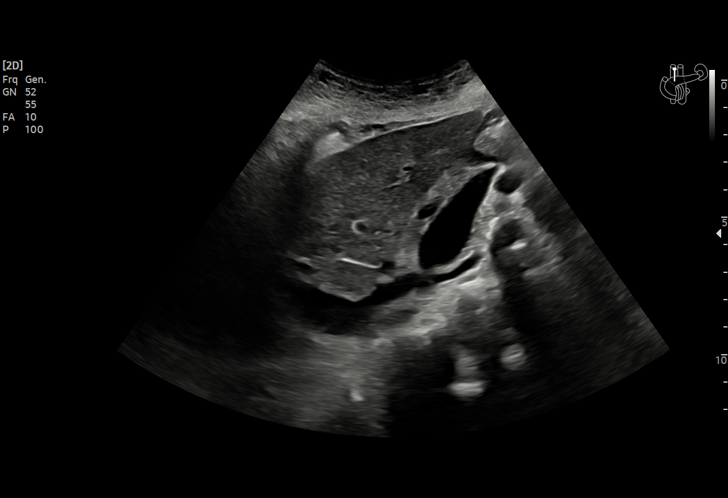
[im 45/89]
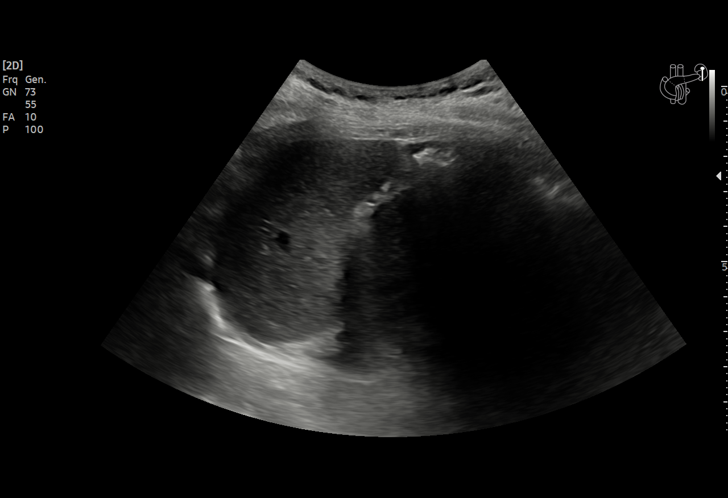
[im 52/89]
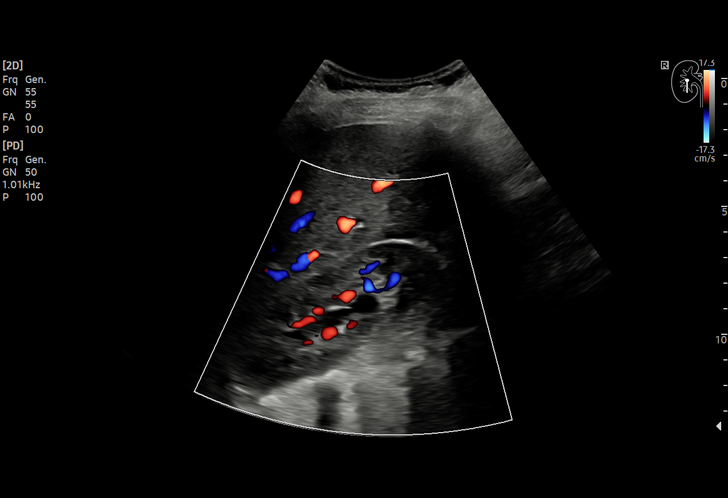
[im 56/89]
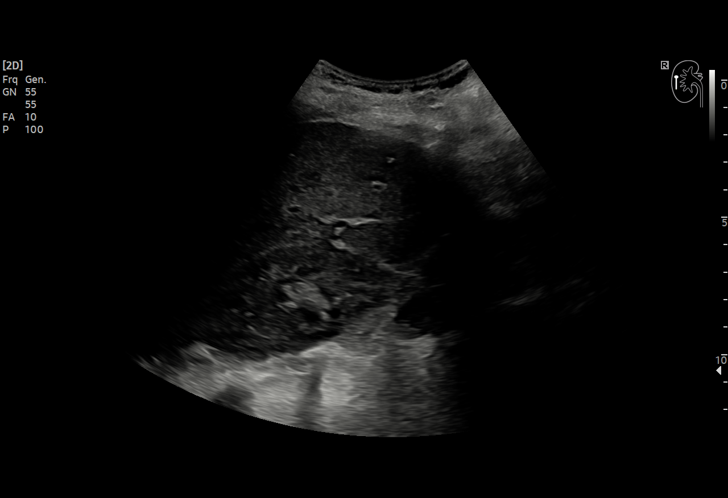
[im 63/89]
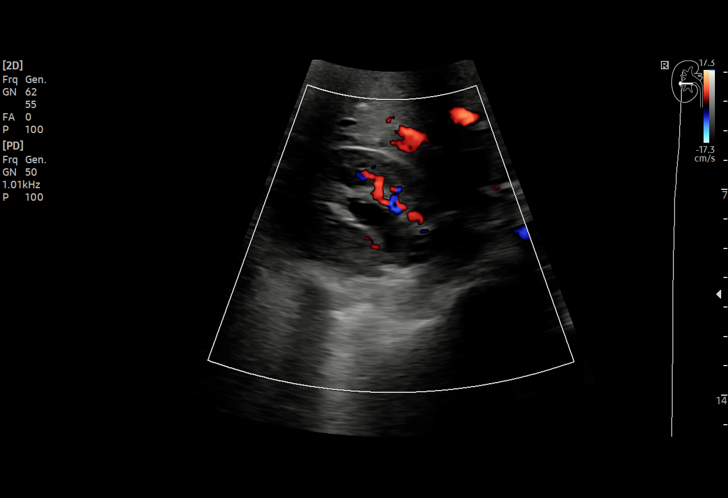
[im 70/89]
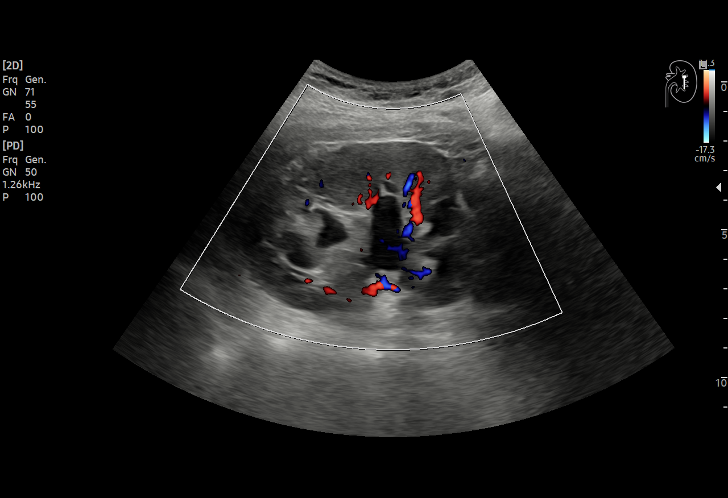
[im 74/89]
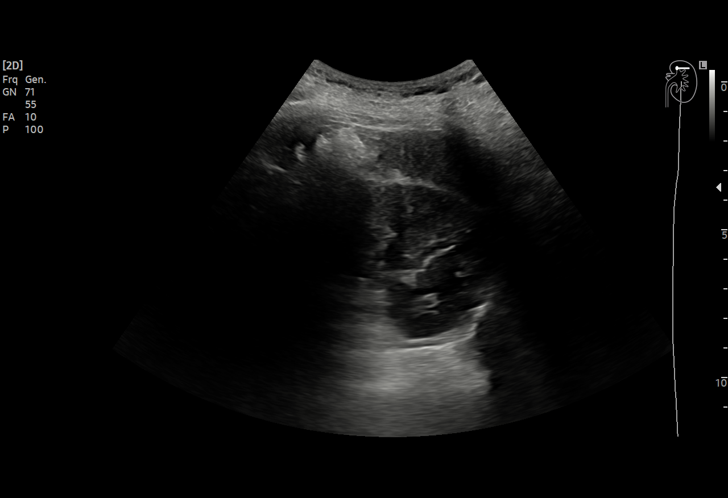
[im 81/89]
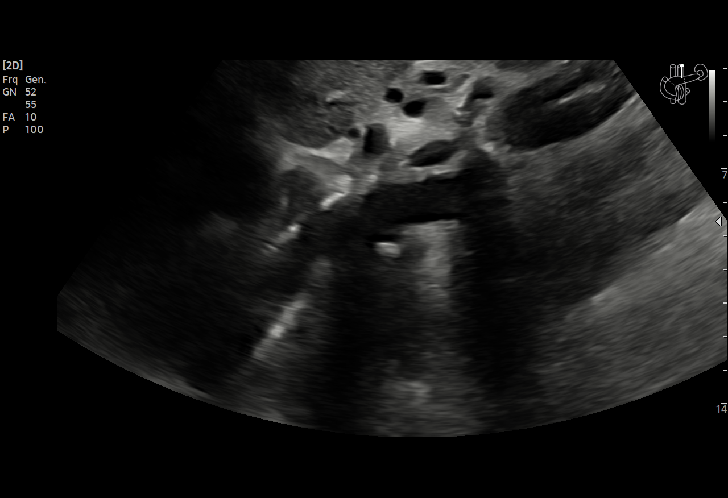
[im 89/89]
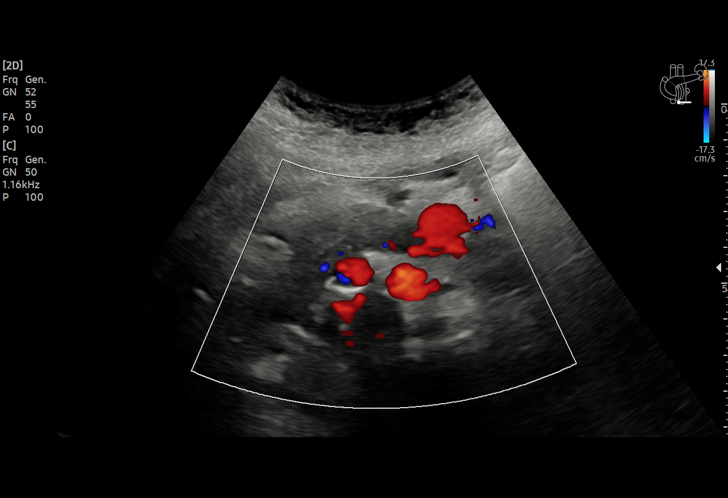

[15 of 25 positions shown; findings below may reference images not displayed]

FINDINGS: Gallbladder: No gallstones or wall thickening visualized. No
sonographic Murphy sign noted by sonographer.

Common bile duct: Diameter: 4.9 mm

Liver: No focal lesion identified. Within normal limits in
parenchymal echogenicity. Portal vein is patent on color Doppler
imaging with normal direction of blood flow towards the liver.

IVC: No abnormality visualized.

Pancreas: Pancreatic duct dilated up to 3.7 mm. No visible
obstructive lesion evident by sonography.

Spleen: Size and appearance within normal limits.

Right Kidney: Length: 9.5 cm. Renal echogenicity within normal
limits. No nephrolithiasis. Mild hydronephrosis. No focal renal
mass.

Left Kidney: Length: 9.4 cm. Renal echogenicity within normal
limits. No nephrolithiasis. Mild hydronephrosis. No focal renal
mass.

Abdominal aorta: Atherosclerotic irregularity without aneurysm.

Other findings: No free fluid seen within the abdomen.
IMPRESSION: 1. Prominent and dilated pancreatic duct measuring up to 3.7 mm. No
visible obstructive lesion by sonography. No biliary dilatation,
cholelithiasis, or choledocholithiasis. Correlation with serum
lipase recommended. Additionally, further evaluation with dedicated
MRI/MRCP suggested for further evaluation.
2. Mild bilateral hydronephrosis, of uncertain etiology. No visible
nephrolithiasis by sonography.
3.  Aortic Atherosclerosis ([1H]-[1H]).
4. Otherwise unremarkable abdominal ultrasound.

## 2021-02-21 NOTE — Progress Notes (Signed)
Gastroenterology Consultation  Referring Provider:     Juline Patch, MD Primary Care Physician:  Juline Patch, MD Primary Gastroenterologist:  Dr. Allen Norris     Reason for Consultation:     weight loss of 20 lbs with GERD        HPI:   Cristina Morgan is a 86 y.o. y/o female referred for consultation & management of Weight loss and GERD. by Dr. Ronnald Ramp, Iven Finn, MD.  This patient reports that she was having significant symptoms of GERD with burning in her esophagus from think she was eating including drinking juice. She states that after she was drinking the juice she would have nausea with burning in her esophagus which she states made her not want to eat.  After not eating she then reports she had lost all the weight.  She now avoids those foods for the last 2 weeks and states that she has not lost any further weight although she denies gaining any weight.  Past Medical History:  Diagnosis Date   Diabetes mellitus without complication ([place])    Hyperlipidemia    Hypertension     Past Surgical History:  Procedure Laterality Date   CATARACT EXTRACTION Bilateral 2014   COLONOSCOPY  2011   cleared for 5 yrs- [facility]   ECTOPIC PREGNANCY SURGERY      Prior to Admission medications   Medication Sig Start Date End Date Taking? Authorizing Provider  acetaminophen (TYLENOL) 325 MG tablet Take 650 mg by mouth every 6 (six) hours as needed.   Yes [provider]  aspirin 81 MG chewable tablet Chew 1 tablet (81 mg total) by mouth daily. 07/06/18  Yes Juline Patch, MD  baclofen (LIORESAL) 20 MG tablet TAKE ONE TABLET BY MOUTH THREE times daily 02/21/21  Yes Juline Patch, MD  Calcium Carbonate-Vit D-Min (CALCIUM 1200 PO) Take 1 capsule by mouth daily.   Yes [provider]  cholecalciferol (VITAMIN D3) 25 MCG (1000 UNIT) tablet Take 1,000 Units by mouth daily.   Yes [provider]  glipiZIDE (GLUCOTROL XL) 10 MG 24 hr tablet Take 10 mg by mouth daily. 02/20/21  Yes  [provider]  hydrOXYzine (ATARAX/VISTARIL) 10 MG tablet Take 1 tablet (10 mg total) by mouth at bedtime as needed. 12/02/19  Yes Juline Patch, MD  losartan (COZAAR) 25 MG tablet TAKE ONE TABLET BY MOUTH EVERY EVENING 02/21/21  Yes Juline Patch, MD  losartan (COZAAR) 50 MG tablet Take 1 tablet (50 mg total) by mouth daily. 12/14/20  Yes Juline Patch, MD  lovastatin (MEVACOR) 40 MG tablet TAKE ONE TABLET BY MOUTH EVERY EVENING 02/20/21  Yes Juline Patch, MD  meloxicam (MOBIC) 15 MG tablet Take 1 tablet (15 mg total) by mouth daily. 11/16/20  Yes Juline Patch, MD  metFORMIN (GLUCOPHAGE) 1000 MG tablet TAKE ONE TABLET BY MOUTH TWICE DAILY 02/20/21  Yes Juline Patch, MD  Multiple Vitamin (MULTIVITAMIN ADULT PO) Take by mouth.   Yes [provider]  Omega-3 Fatty Acids (FISH OIL) 1000 MG CAPS Take 1 capsule (1,000 mg total) by mouth daily. 04/19/19  Yes Juline Patch, MD  pantoprazole (PROTONIX) 40 MG tablet Take 1 tablet (40 mg total) by mouth daily. 02/01/21  Yes Juline Patch, MD  sertraline (ZOLOFT) 25 MG tablet TAKE ONE TABLET BY MOUTH ONCE DAILY 02/11/21  Yes Juline Patch, MD    Family History  Problem Relation Age of Onset  Cancer Mother        breast   Heart disease Mother    Diabetes Father    Heart disease Father    Heart attack Father    Heart attack Brother    Diabetes Brother      Social History   Tobacco Use   Smoking status: Never   Smokeless tobacco: Never   Tobacco comments:    smoking cessation materials not required  Vaping Use   Vaping Use: Never used  Substance Use Topics   Alcohol use: No    Alcohol/week: 0.0 standard drinks   Drug use: No    Allergies as of 02/21/2021 - Review Complete 02/21/2021  Allergen Reaction Noted   Latex Rash 11/12/2020   Penicillins Other (See Comments) 01/03/2015    Review of Systems:    All systems reviewed and negative except where noted in HPI.   Physical Exam:  BP (!) 142/61 (BP  Location: Left Arm, Patient Position: Sitting, Cuff Size: Normal)   Pulse (!) 105   Temp 97.7 F (36.5 C) (Temporal)   Ht 5\' 7"  (1.702 m)   Wt 135 lb (61.2 kg)   BMI 21.14 kg/m  No LMP recorded. Patient is postmenopausal. General:   Alert,  Well-developed, well-nourished, pleasant and cooperative in NAD, The patient is in a back brace due to an injury. Head:  Normocephalic and atraumatic. Eyes:  Sclera clear, no icterus.   Conjunctiva pink. Ears:  Normal auditory acuity. Neck:  Supple; no masses or thyromegaly. Lungs:  Respirations even and unlabored.  Clear throughout to auscultation.   No wheezes, crackles, or rhonchi. No acute distress. Heart:  Regular rate and rhythm; no murmurs, clicks, rubs, or gallops. Abdomen:  Normal bowel sounds.  No bruits.  Soft, non-tender and non-distended without masses, hepatosplenomegaly or hernias noted.  No guarding or rebound tenderness.  Negative Carnett sign.   Rectal:  Deferred.  Pulses:  Normal pulses noted. Extremities:  No clubbing or edema.  No cyanosis. Neurologic:  Alert and oriented x3;  grossly normal neurologically. Skin:  Intact without significant lesions or rashes.  No jaundice. Lymph Nodes:  No significant cervical adenopathy. Psych:  Alert and cooperative. Normal mood and affect.  Imaging Studies: DG Chest 2 View  Result Date: 02/19/2021 CLINICAL DATA:  Weight loss. History of right middle lobe nodule. Hypercalcemia. EXAM: CHEST - 2 VIEW COMPARISON:  03/03/2008.  01/14/2008. FINDINGS: Heart size is upper limits of normal. Aortic atherosclerotic calcification is noted. The lungs are clear. No pulmonary nodule is seen. There appear to be old healed bilateral rib fractures. No effusions. Mild chronic scoliotic curvature of the spine. IMPRESSION: No active disease. Aortic atherosclerosis. Old healed bilateral rib fractures. Electronically Signed   By: Nelson Chimes M.D.   On: 02/19/2021 15:45    Assessment and Plan:   Cristina Morgan is  a 86 y.o. y/o female Who comes in today with a history of weight loss.  The patient denies any change in bowel habits nausea vomiting fevers chills black stools or bloody stools.  She also states that she believes the weight loss is because she had nausea from significant reflux.  The patient reports that she did not take the PPI she was prescribed but has been encouraged to start taking the PPI.  The patient will wait 2 more weeks and then her family will contact me if she continues to lose weight or she has not gained weight thereby meaning that there may's be something else going  on.  If that should happen the patient may need to undergo a CT scan of the abdomen and pelvis or an upper endoscopy and colonoscopy.  We will decide together at that time.    Lucilla Lame, MD. Marval Regal    Note: This dictation was prepared with Dragon dictation along with smaller phrase technology. Any transcriptional errors that result from this process are unintentional.

## 2021-02-22 ENCOUNTER — Encounter: Payer: Self-pay | Admitting: Family Medicine

## 2021-02-22 ENCOUNTER — Ambulatory Visit (INDEPENDENT_AMBULATORY_CARE_PROVIDER_SITE_OTHER): Payer: Medicare Other | Admitting: Family Medicine

## 2021-02-22 VITALS — BP 124/60 | HR 72 | Ht 67.0 in | Wt 135.0 lb

## 2021-02-22 DIAGNOSIS — R935 Abnormal findings on diagnostic imaging of other abdominal regions, including retroperitoneum: Secondary | ICD-10-CM | POA: Diagnosis not present

## 2021-02-22 DIAGNOSIS — I10 Essential (primary) hypertension: Secondary | ICD-10-CM | POA: Diagnosis not present

## 2021-02-22 DIAGNOSIS — R1900 Intra-abdominal and pelvic swelling, mass and lump, unspecified site: Secondary | ICD-10-CM

## 2021-02-22 DIAGNOSIS — R112 Nausea with vomiting, unspecified: Secondary | ICD-10-CM | POA: Diagnosis not present

## 2021-02-22 DIAGNOSIS — E119 Type 2 diabetes mellitus without complications: Secondary | ICD-10-CM

## 2021-02-22 DIAGNOSIS — F5102 Adjustment insomnia: Secondary | ICD-10-CM | POA: Diagnosis not present

## 2021-02-22 DIAGNOSIS — F329 Major depressive disorder, single episode, unspecified: Secondary | ICD-10-CM | POA: Diagnosis not present

## 2021-02-22 DIAGNOSIS — E7800 Pure hypercholesterolemia, unspecified: Secondary | ICD-10-CM | POA: Diagnosis not present

## 2021-02-22 MED ORDER — LOVASTATIN 40 MG PO TABS: 40.0000 mg | ORAL_TABLET | Freq: Every evening | ORAL | 1 refills | Status: DC

## 2021-02-22 MED ORDER — SERTRALINE HCL 25 MG PO TABS: 25.0000 mg | ORAL_TABLET | Freq: Every day | ORAL | 1 refills | Status: DC

## 2021-02-22 MED ORDER — HYDROXYZINE HCL 10 MG PO TABS: 10.0000 mg | ORAL_TABLET | Freq: Every evening | ORAL | 1 refills | Status: DC | PRN

## 2021-02-22 MED ORDER — LOSARTAN POTASSIUM 50 MG PO TABS: 50.0000 mg | ORAL_TABLET | Freq: Every day | ORAL | 1 refills | Status: DC

## 2021-02-22 MED ORDER — PANTOPRAZOLE SODIUM 40 MG PO TBEC: 40.0000 mg | DELAYED_RELEASE_TABLET | Freq: Every day | ORAL | 1 refills | Status: DC

## 2021-02-22 NOTE — Progress Notes (Addendum)
Date:  02/22/2021   Name:  Cristina Morgan   DOB:  1935/03/12   MRN:  [id number]   Chief Complaint: Depression, Gastroesophageal Reflux, Hyperlipidemia, and Hypertension  Depression        This is a chronic problem.  The current episode started more than 1 year ago.   The problem occurs intermittently.  The problem has been gradually improving since onset.  Associated symptoms include no decreased concentration, no fatigue, no helplessness, does not have insomnia, not irritable, no restlessness, no decreased interest, no body aches, no myalgias, no headaches, no indigestion and no suicidal ideas.     The symptoms are aggravated by nothing.  Past treatments include SSRIs - Selective serotonin reuptake inhibitors.  Compliance with treatment is variable. Gastroesophageal Reflux She reports no abdominal pain, no belching, no chest pain, no choking, no coughing, no dysphagia, no early satiety, no globus sensation, no heartburn, no hoarse voice, no nausea, no sore throat, no stridor, no tooth decay, no water brash or no wheezing. This is a chronic problem. The current episode started more than 1 year ago. The problem has been gradually improving. The symptoms are aggravated by certain foods. Pertinent negatives include no fatigue. She has tried nothing for the symptoms.  Hyperlipidemia This is a chronic problem. The current episode started more than 1 year ago. Recent lipid tests were reviewed and are normal. She has no history of chronic renal disease. Pertinent negatives include no chest pain, myalgias or shortness of breath. Current antihyperlipidemic treatment includes statins. The current treatment provides moderate improvement of lipids. There are no compliance problems.   Hypertension This is a chronic problem. The current episode started more than 1 year ago. The problem has been gradually improving since onset. The problem is controlled. Pertinent negatives include no chest pain, headaches, neck  pain, palpitations or shortness of breath. Past treatments include angiotensin blockers. The current treatment provides moderate improvement. There are no compliance problems.  There is no history of angina, kidney disease, CAD/MI, CVA, heart failure, left ventricular hypertrophy, PVD or retinopathy. There is no history of chronic renal disease, a hypertension causing med or renovascular disease.   Lab Results  Component Value Date   NA 138 02/01/2021   K 4.3 02/01/2021   CO2 23 02/01/2021   GLUCOSE 178 (H) 02/01/2021   BUN 19 02/01/2021   CREATININE 0.84 02/01/2021   CALCIUM 11.1 (H) 02/18/2021   EGFR 68 02/01/2021   GFRNONAA 78 12/02/2019   Lab Results  Component Value Date   CHOL 176 07/24/2020   HDL 74 07/24/2020   LDLCALC 90 07/24/2020   TRIG 63 07/24/2020   CHOLHDL 2.7 03/01/2018   No results found for: TSH Lab Results  Component Value Date   HGBA1C 5.9 (H) 02/01/2021   Lab Results  Component Value Date   WBC 5.0 02/18/2021   HGB 10.3 (L) 02/18/2021   HCT 31.8 (L) 02/18/2021   MCV 98 (H) 02/18/2021   PLT 313 02/18/2021   Lab Results  Component Value Date   ALT 9 02/01/2021   AST 16 02/01/2021   ALKPHOS 102 02/01/2021   BILITOT 0.3 02/01/2021   No results found for: 25OHVITD2, 25OHVITD3, VD25OH   Review of Systems  Constitutional:  Negative for chills, fatigue and fever.  HENT:  Negative for drooling, ear discharge, ear pain, hoarse voice and sore throat.   Respiratory:  Negative for cough, choking, shortness of breath and wheezing.   Cardiovascular:  Negative for chest  pain, palpitations and leg swelling.  Gastrointestinal:  Negative for abdominal pain, blood in stool, constipation, diarrhea, dysphagia, heartburn and nausea.  Endocrine: Negative for polydipsia.  Genitourinary:  Negative for dysuria, frequency, hematuria and urgency.  Musculoskeletal:  Negative for back pain, myalgias and neck pain.  Skin:  Negative for rash.  Allergic/Immunologic: Negative  for environmental allergies.  Neurological:  Negative for dizziness and headaches.  Hematological:  Does not bruise/bleed easily.  Psychiatric/Behavioral:  Positive for depression. Negative for decreased concentration and suicidal ideas. The patient is not nervous/anxious and does not have insomnia.    Patient Active Problem List   Diagnosis Date Noted   Closed compression fracture of second lumbar vertebra ([place]) 10/28/2020   Need for vaccination against Streptococcus pneumoniae using pneumococcal conjugate vaccine 13 01/07/2017   Primary osteoarthritis of right knee 12/08/2016   PNA (pneumonia) 07/09/2015   Acute chest pain 03/20/2014   Type 2 diabetes mellitus ([place]) 03/20/2014   Hypercholesterolemia 03/20/2014   Essential hypertension 03/20/2014    Allergies  Allergen Reactions   Latex Rash   Penicillins Other (See Comments)    Past Surgical History:  Procedure Laterality Date   CATARACT EXTRACTION Bilateral 2014   COLONOSCOPY  2011   cleared for 5 yrs- [facility]   ECTOPIC PREGNANCY SURGERY      Social History   Tobacco Use   Smoking status: Never   Smokeless tobacco: Never   Tobacco comments:    smoking cessation materials not required  Vaping Use   Vaping Use: Never used  Substance Use Topics   Alcohol use: No    Alcohol/week: 0.0 standard drinks   Drug use: No     Medication list has been reviewed and updated.  Current Meds  Medication Sig   acetaminophen (TYLENOL) 325 MG tablet Take 650 mg by mouth every 6 (six) hours as needed.   aspirin 81 MG chewable tablet Chew 1 tablet (81 mg total) by mouth daily.   baclofen (LIORESAL) 20 MG tablet TAKE ONE TABLET BY MOUTH THREE times daily   Calcium Carbonate-Vit D-Min (CALCIUM 1200 PO) Take 1 capsule by mouth daily.   cholecalciferol (VITAMIN D3) 25 MCG (1000 UNIT) tablet Take 1,000 Units by mouth daily.   glipiZIDE (GLUCOTROL XL) 10 MG 24 hr tablet Take 10 mg by mouth daily.   hydrOXYzine (ATARAX/VISTARIL) 10 MG  tablet Take 1 tablet (10 mg total) by mouth at bedtime as needed.   losartan (COZAAR) 50 MG tablet Take 1 tablet (50 mg total) by mouth daily.   lovastatin (MEVACOR) 40 MG tablet TAKE ONE TABLET BY MOUTH EVERY EVENING   meloxicam (MOBIC) 15 MG tablet Take 1 tablet (15 mg total) by mouth daily.   metFORMIN (GLUCOPHAGE) 1000 MG tablet TAKE ONE TABLET BY MOUTH TWICE DAILY   Multiple Vitamin (MULTIVITAMIN ADULT PO) Take by mouth.   Omega-3 Fatty Acids (FISH OIL) 1000 MG CAPS Take 1 capsule (1,000 mg total) by mouth daily.   pantoprazole (PROTONIX) 40 MG tablet Take 1 tablet (40 mg total) by mouth daily.   sertraline (ZOLOFT) 25 MG tablet TAKE ONE TABLET BY MOUTH ONCE DAILY   [DISCONTINUED] losartan (COZAAR) 25 MG tablet TAKE ONE TABLET BY MOUTH EVERY EVENING    PHQ 2/9 Scores 02/01/2021 12/12/2020 11/07/2020 07/24/2020  PHQ - 2 Score 0 0 0 0  PHQ- 9 Score 2 4 - 0    GAD 7 : Generalized Anxiety Score 02/01/2021 12/12/2020 07/24/2020 04/09/2020  Nervous, Anxious, on Edge 0 0 1 0  Control/stop worrying 0 0 0 0  Worry too much - different things 0 0 0 0  Trouble relaxing 1 0 0 0  Restless - 0 0 0  Easily annoyed or irritable 0 0 0 0  Afraid - awful might happen 0 0 0 0  Total GAD 7 Score - 0 1 0  Anxiety Difficulty - - Not difficult at all Not difficult at all    BP Readings from Last 3 Encounters:  02/22/21 124/60  02/21/21 (!) 142/61  02/18/21 130/70    Physical Exam Vitals and nursing note reviewed.  Constitutional:      General: She is not irritable.She is not in acute distress.    Appearance: She is not diaphoretic.  HENT:     Head: Normocephalic and atraumatic.     Right Ear: Tympanic membrane and external ear normal.     Left Ear: Tympanic membrane and external ear normal.     Nose: Nose normal.  Eyes:     General:        Right eye: No discharge.        Left eye: No discharge.     Conjunctiva/sclera: Conjunctivae normal.     Pupils: Pupils are equal, round, and reactive to  light.  Neck:     Thyroid: No thyromegaly.     Vascular: No JVD.  Cardiovascular:     Rate and Rhythm: Normal rate and regular rhythm.     Heart sounds: Normal heart sounds. No murmur heard.   No friction rub. No gallop.  Pulmonary:     Effort: Pulmonary effort is normal.     Breath sounds: Normal breath sounds. No wheezing or rhonchi.  Abdominal:     General: Bowel sounds are normal.     Palpations: Abdomen is soft. There is no mass.     Tenderness: There is no abdominal tenderness. There is no guarding.  Musculoskeletal:        General: Normal range of motion.     Cervical back: Normal range of motion and neck supple.  Lymphadenopathy:     Cervical: No cervical adenopathy.  Skin:    General: Skin is warm and dry.  Neurological:     Mental Status: She is alert.     Deep Tendon Reflexes: Reflexes are normal and symmetric.    Wt Readings from Last 3 Encounters:  02/22/21 135 lb (61.2 kg)  02/21/21 135 lb (61.2 kg)  02/18/21 131 lb (59.4 kg)    BP 124/60   Pulse 72   Ht '5\' 7"'  (1.702 m)   Wt 135 lb (61.2 kg)   BMI 21.14 kg/m   Assessment and Plan: 1. Adjustment insomnia Chronic.  Controlled.  Stable.  Continue hydroxyzine 10 mg nightly and sertraline 25 mg 1 a day. - hydrOXYzine (ATARAX) 10 MG tablet; Take 1 tablet (10 mg total) by mouth at bedtime as needed.  Dispense: 90 tablet; Refill: 1 - sertraline (ZOLOFT) 25 MG tablet; Take 1 tablet (25 mg total) by mouth daily.  Dispense: 90 tablet; Refill: 1  2. Type 2 diabetes mellitus without complication, without long-term current use of insulin (HCC) Chronic.  Controlled.  Stable.  This is diet control and is currently doing well with A1c at 5.93 weeks ago.  Can continue with dietary approach and we will continue losartan to be used for current surveillance of diabetic nephropathy. - losartan (COZAAR) 50 MG tablet; Take 1 tablet (50 mg total) by mouth daily.  Dispense: 90 tablet; Refill: 1  3. Essential  hypertension Chronic.  Controlled.  Stable.  Blood pressure today is 124/60.  Continue losartan 50 mg once a day. - losartan (COZAAR) 50 MG tablet; Take 1 tablet (50 mg total) by mouth daily.  Dispense: 90 tablet; Refill: 1  4. Hypercholesterolemia Chronic.  Controlled.  Stable.  Continue lovastatin 40 mg once a day. - lovastatin (MEVACOR) 40 MG tablet; Take 1 tablet (40 mg total) by mouth every evening.  Dispense: 90 tablet; Refill: 1  5. Nausea and vomiting, unspecified vomiting type Patient continues to have nausea and vomiting but has not been taking pantoprazole and this will be initiated to decrease nausea.  Patient has seen GI and is followed by Dr. Allen Norris and as noted below that we have them.  That were going to examined closely with MRI involving the hepatic pancreatic ducts and pancreas. - pantoprazole (PROTONIX) 40 MG tablet; Take 1 tablet (40 mg total) by mouth daily.  Dispense: 90 tablet; Refill: 1  6. Reactive depression Chronic.  Controlled.  Stable.  PHQ is2.  Continue sertraline 25 mg once a day. - sertraline (ZOLOFT) 25 MG tablet; Take 1 tablet (25 mg total) by mouth daily.  Dispense: 90 tablet; Refill: 1  7. Abnormal ultrasound of abdomen Noted on abdominal ultrasound that patient has a dilated pancreatic duct with no visible obstructive lesion by sonography.  Lipase was mildly elevated so we will proceed with MRI/MR CP for further evaluation. - MR ABDOMEN MRCP W WO CONTAST  8. Intra-abdominal and pelvic swelling, mass and lump, unspecified site Noted on ultrasound of pelvic via transvaginal a complex left ovarian cyst with internal solid mural nodule concerning for possible cystic ovarian neoplasm.  We will refer to GYN for further evaluation.  Approach - MR ABDOMEN MRCP W WO CONTAST

## 2021-03-02 ENCOUNTER — Other Ambulatory Visit: Payer: Self-pay

## 2021-03-02 ENCOUNTER — Ambulatory Visit
Admission: RE | Admit: 2021-03-02 | Discharge: 2021-03-02 | Disposition: A | Discharge status: Home / Self Care | Payer: Medicare Other | Source: Ambulatory Visit | Attending: Family Medicine | Admitting: Family Medicine

## 2021-03-02 DIAGNOSIS — N281 Cyst of kidney, acquired: Secondary | ICD-10-CM | POA: Diagnosis not present

## 2021-03-02 DIAGNOSIS — R935 Abnormal findings on diagnostic imaging of other abdominal regions, including retroperitoneum: Secondary | ICD-10-CM | POA: Insufficient documentation

## 2021-03-02 DIAGNOSIS — R1900 Intra-abdominal and pelvic swelling, mass and lump, unspecified site: Secondary | ICD-10-CM | POA: Insufficient documentation

## 2021-03-02 DIAGNOSIS — K8689 Other specified diseases of pancreas: Secondary | ICD-10-CM | POA: Diagnosis not present

## 2021-03-02 DIAGNOSIS — R634 Abnormal weight loss: Secondary | ICD-10-CM | POA: Diagnosis not present

## 2021-03-02 IMAGING — MR MR ABDOMEN WO/W CM MRCP
20 of 21 series · 45 of 48 positions shown · IV contrast (gadavist)
Comparison: Ultrasound on [DATE]

CLINICAL DATA: Unintentional weight loss. Mild pancreatic ductal
dilatation on recent ultrasound.

EXAM:
MRI ABDOMEN WITHOUT AND WITH CONTRAST (INCLUDING MRCP)
TECHNIQUE: Multiplanar multisequence MR imaging of the abdomen was performed
both before and after the administration of intravenous contrast.
Heavily T2-weighted images of the biliary and pancreatic ducts were
obtained, and three-dimensional MRCP images were rendered by post
processing.
CONTRAST:  7.5mL GADAVIST GADOBUTROL 1 MMOL/ML IV SOLN

[Series 3: T2 · coronal · 6.0mm · 1.19mm/px · 1 of 28 slices shown (1 of 2)]
[im 1/28]
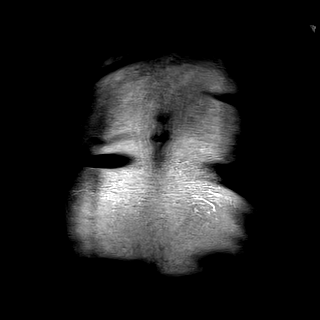

[Series 4: T2 · axial · 6.0mm · 1.19mm/px · 1 of 26 slices shown (2 of 2)]
[im 1/26]
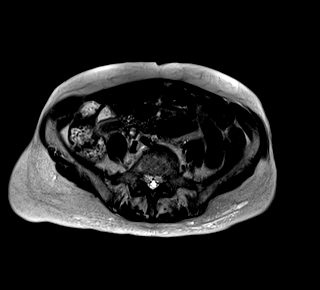

[Series 5: T1 · axial · 3.0mm · 1.19mm/px · z∈[-56,+157]mm · 2 of 72 slices shown (1 of 2)]
[im 1/72]
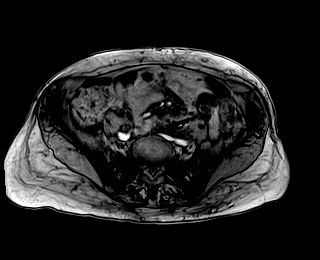
[im 72/72]
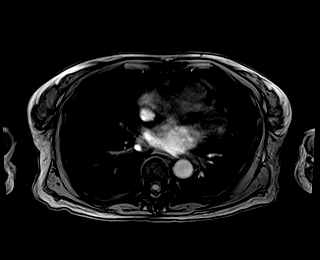

[Series 6: T1 · axial · 3.0mm · 1.19mm/px · z∈[-56,+157]mm · 3 of 72 slices shown (2 of 2)]
[im 1/72]
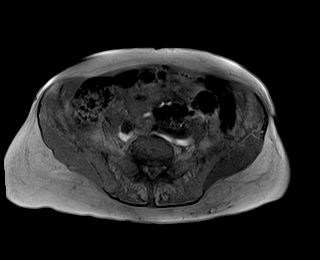
[im 36/72]
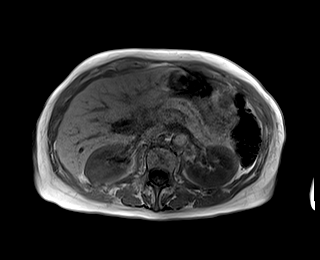
[im 72/72]
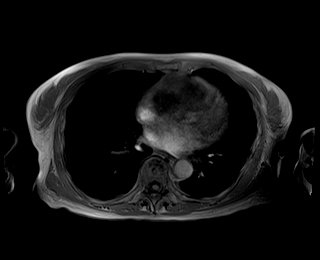

[Series 9: T2 fat-sat · axial · 6.0mm · 1.19mm/px · 1 of 28 slices shown]
[im 1/28]
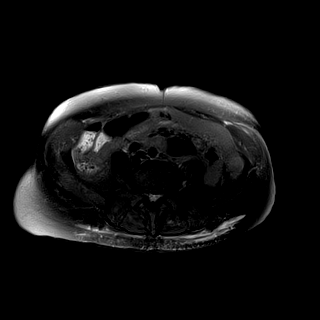

[Series 10: ax dwi_tracew · axial · 6.0mm · 1.42mm/px · z∈[-30,+164]mm · 3 of 84 slices shown]
[im 1/84]
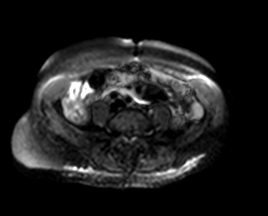
[im 42/84]
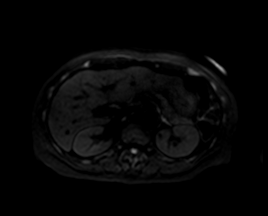
[im 84/84]
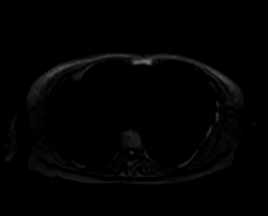

[Series 11: ax dwi_adc · axial · 6.0mm · 1.42mm/px · 1 of 28 slices shown]
[im 1/28]
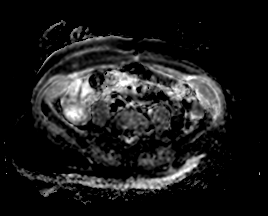

[Series 15: MRCP · coronal · 3.0mm · 1.12mm/px · 1 of 20 slices shown (1 of 2)]
[im 1/20]
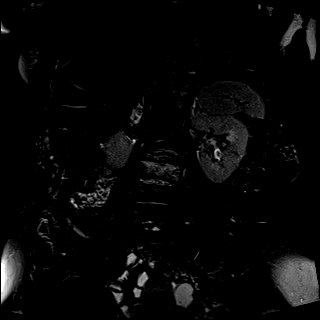

[Series 16: radials · coronal · 50.0mm · 0.78mm/px · 1 of 5 slices shown]
[im 1/5]
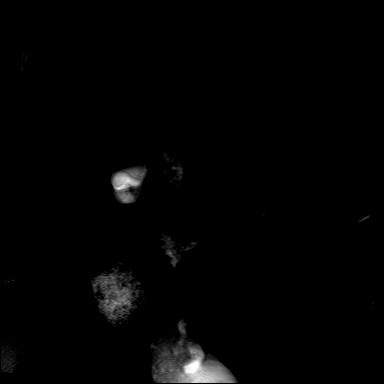

[Series 17: T1 dynamic fat-sat · axial · non-contrast · 3.0mm · 1.19mm/px · z∈[-56,+157]mm · 3 of 72 slices shown (1 of 5)]
[im 1/72]
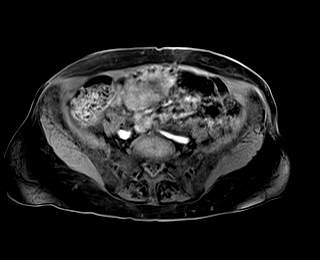
[im 36/72]
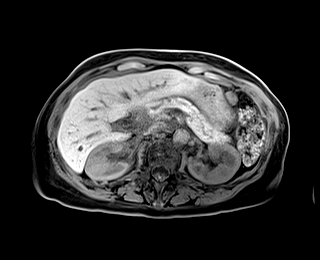
[im 72/72]
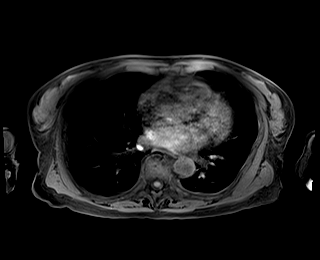

[Series 18: T1 dynamic fat-sat post-contrast · axial · 3.0mm · 1.19mm/px · z∈[-56,+157]mm · 3 of 72 slices shown (1 of 4)]
[im 1/72]
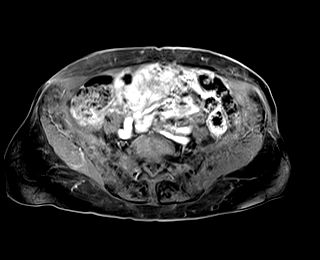
[im 36/72]
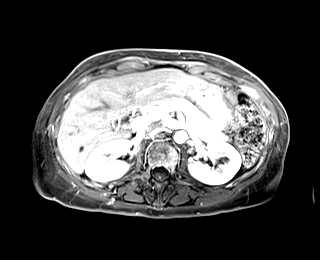
[im 72/72]
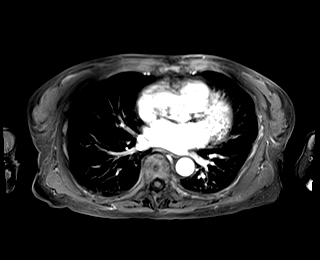

[Series 19: T1 dynamic fat-sat · axial · 3.0mm · 1.19mm/px · z∈[-56,+157]mm · 3 of 72 slices shown (2 of 5)]
[im 1/72]
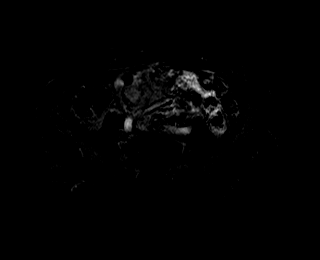
[im 36/72]
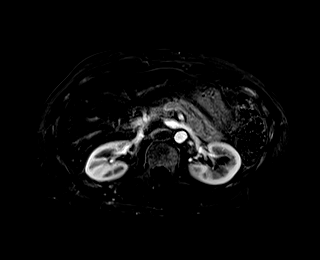
[im 72/72]
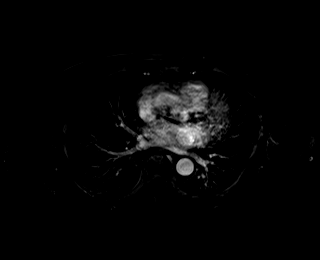

[Series 20: T1 dynamic fat-sat post-contrast · axial · 3.0mm · 1.19mm/px · z∈[-56,+157]mm · 3 of 72 slices shown (2 of 4)]
[im 1/72]
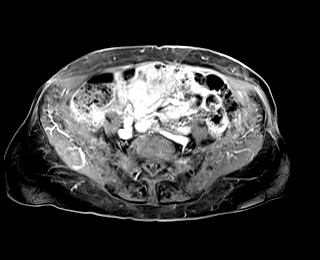
[im 36/72]
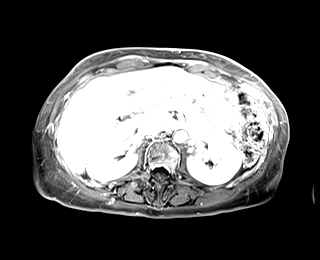
[im 72/72]
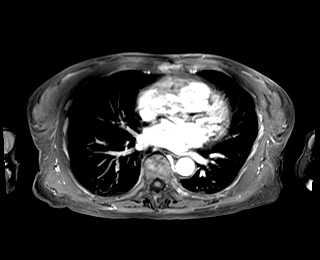

[Series 21: T1 dynamic fat-sat · axial · 3.0mm · 1.19mm/px · z∈[-56,+157]mm · 3 of 72 slices shown (3 of 5)]
[im 1/72]
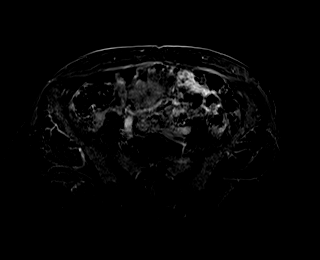
[im 36/72]
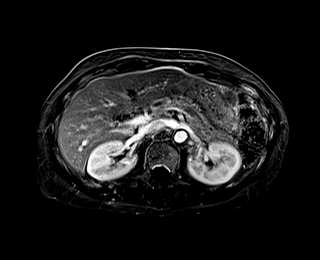
[im 72/72]
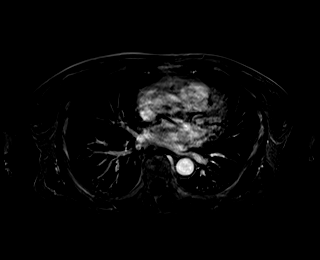

[Series 22: T1 dynamic fat-sat post-contrast · axial · 3.0mm · 1.19mm/px · z∈[-56,+157]mm · 3 of 72 slices shown (3 of 4)]
[im 1/72]
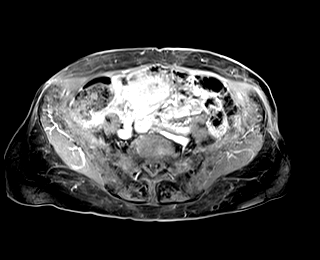
[im 36/72]
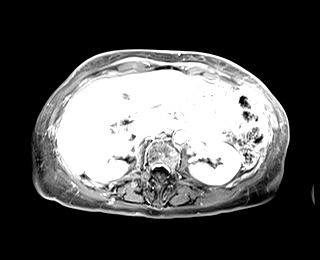
[im 72/72]
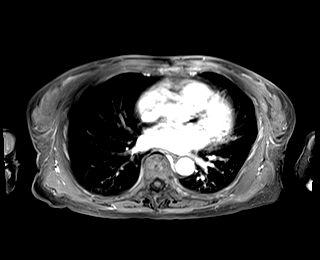

[Series 23: T1 dynamic fat-sat · axial · 3.0mm · 1.19mm/px · z∈[-56,+157]mm · 3 of 72 slices shown (4 of 5)]
[im 1/72]
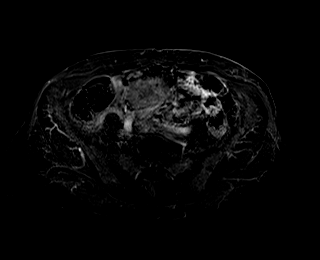
[im 36/72]
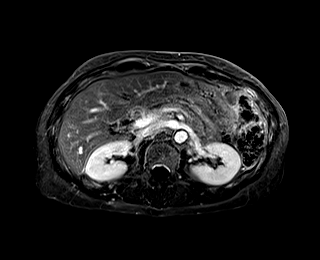
[im 72/72]
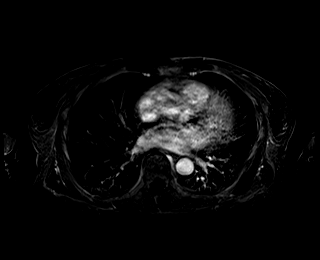

[Series 24: T1 dynamic post-contrast · coronal · 3.0mm · 1.19mm/px · 3 of 72 slices shown]
[im 1/72]
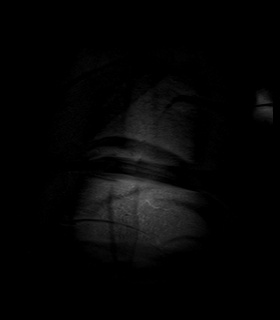
[im 36/72]
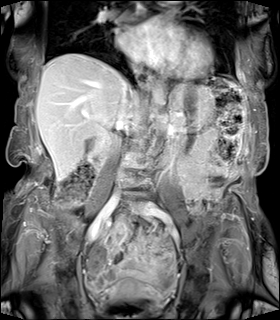
[im 72/72]
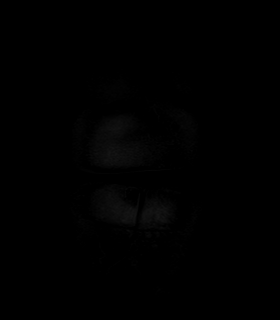

[Series 25: T1 dynamic fat-sat post-contrast · axial · 3.0mm · 1.19mm/px · z∈[-56,+157]mm · 3 of 72 slices shown (4 of 4)]
[im 1/72]
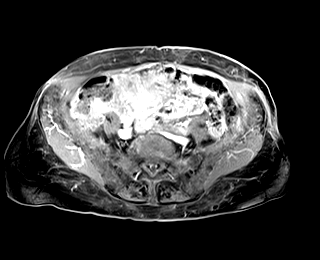
[im 36/72]
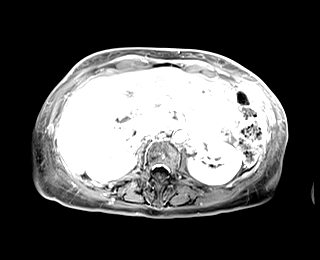
[im 72/72]
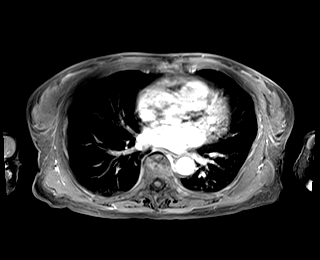

[Series 26: T1 dynamic fat-sat · axial · 3.0mm · 1.19mm/px · z∈[-56,+157]mm · 3 of 72 slices shown (5 of 5)]
[im 1/72]
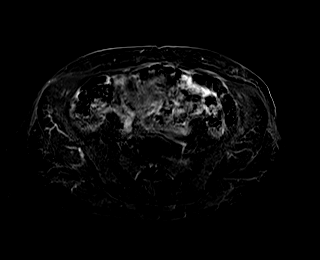
[im 36/72]
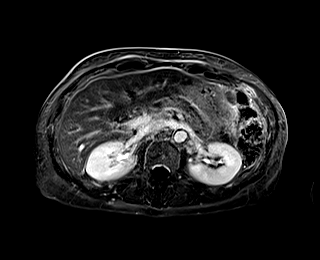
[im 72/72]
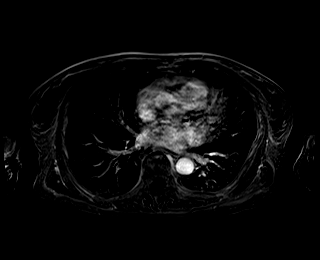

[Series 1030: MRCP · sagittal · 0.5mm · 0.47mm/px · 1 of 4 slices shown (2 of 2)]
[im 1/4]
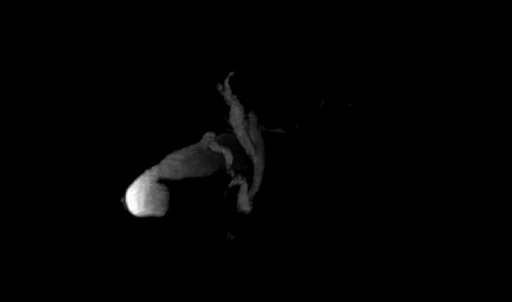

[45 of 48 positions shown; findings below may reference images not displayed]

FINDINGS: Lower chest: No acute findings.

Hepatobiliary: No hepatic masses identified. Gallbladder is
unremarkable. No evidence of biliary ductal dilatation or
choledocholithiasis.

Pancreas: Diffuse pancreatic ductal dilatation is seen measuring up
to 6 mm. The pancreatic duct also has an irregular contour, and
these findings are highly suspicious for chronic pancreatitis. No
evidence of pancreas divisum, pancreatic edema or acute
peripancreatic inflammatory changes. No evidence of pancreatic mass.

Spleen:  Within normal limits in size and appearance.

Adrenals/Urinary Tract: No masses identified. A few tiny sub-cm
bilateral renal cysts noted. No evidence of hydronephrosis.

Stomach/Bowel: Visualized portion unremarkable.

Vascular/Lymphatic: No pathologically enlarged lymph nodes
identified. No acute vascular findings.

Other:  None.

Musculoskeletal:  No suspicious bone lesions identified.
IMPRESSION: Diffuse pancreatic ductal dilatation measuring up to 6 mm, with
irregular contour. These findings are highly suspicious for chronic
pancreatitis.

No evidence of pancreatic mass or signs of acute pancreatitis.

No evidence of biliary ductal dilatation or choledocholithiasis.

## 2021-03-02 MED ORDER — GADOBUTROL 1 MMOL/ML IV SOLN
6.0000 mL | Freq: Once | INTRAVENOUS | Status: AC | PRN
  Administered 2021-03-02: 7.5 mL via INTRAVENOUS

## 2021-03-13 DIAGNOSIS — Z20822 Contact with and (suspected) exposure to covid-19: Secondary | ICD-10-CM | POA: Diagnosis not present

## 2021-03-20 ENCOUNTER — Encounter: Payer: Self-pay | Admitting: Obstetrics and Gynecology

## 2021-03-21 ENCOUNTER — Other Ambulatory Visit: Payer: Self-pay | Admitting: Family Medicine

## 2021-03-21 DIAGNOSIS — S32000D Wedge compression fracture of unspecified lumbar vertebra, subsequent encounter for fracture with routine healing: Secondary | ICD-10-CM

## 2021-03-21 DIAGNOSIS — E119 Type 2 diabetes mellitus without complications: Secondary | ICD-10-CM

## 2021-03-21 DIAGNOSIS — M545 Low back pain, unspecified: Secondary | ICD-10-CM

## 2021-03-21 NOTE — Telephone Encounter (Signed)
Requested medication (s) are due for refill today: yes  Requested medication (s) are on the active medication list: yes  Last refill:  02/20/21  Future visit scheduled: yes  Notes to clinic:  Unable to refill per protocol, cannot delegate. Glipizide is a historical med     Requested Prescriptions  Pending Prescriptions Disp Refills   baclofen (LIORESAL) 20 MG tablet [Pharmacy Med Name: baclofen 20 mg tablet] 30 tablet 0    Sig: TAKE ONE TABLET BY MOUTH THREE times daily     Not Delegated - Analgesics:  Muscle Relaxants Failed - 03/21/2021  9:23 AM      Failed - This refill cannot be delegated      Passed - Valid encounter within last 6 months    Recent Outpatient Visits           3 weeks ago Type 2 diabetes mellitus without complication, without long-term current use of insulin ([place])   [facility] Juline Patch, MD   1 month ago Weight loss   [facility] Juline Patch, MD   1 month ago Type 2 diabetes mellitus without complication, without long-term current use of insulin ([place])   [facility] Juline Patch, MD   3 months ago Type 2 diabetes mellitus without complication, without long-term current use of insulin ([place])   [facility] Juline Patch, MD   4 months ago Acute low back pain without sciatica, unspecified back pain laterality   [facility] Juline Patch, MD       Future Appointments             In 3 weeks Juline Patch, MD [facility]             glipiZIDE (GLUCOTROL XL) 10 MG 24 hr tablet [Pharmacy Med Name: glipizide ER 10 mg tablet, extended release 24 hr] 90 tablet 0    Sig: TAKE ONE TABLET BY MOUTH EVERY MORNING     Endocrinology:  Diabetes - Sulfonylureas Passed - 03/21/2021  9:23 AM      Passed - HBA1C is between 0 and 7.9 and within 180 days    Hgb A1c MFr Bld  Date Value Ref Range Status  02/01/2021 5.9 (H) 4.8 - 5.6 % Final    Comment:             Prediabetes: 5.7 - 6.4           Diabetes: >6.4          Glycemic control for adults with diabetes: <7.0           Passed - Valid encounter within last 6 months    Recent Outpatient Visits           3 weeks ago Type 2 diabetes mellitus without complication, without long-term current use of insulin ([place])   [facility] Juline Patch, MD   1 month ago Weight loss   [facility] Juline Patch, MD   1 month ago Type 2 diabetes mellitus without complication, without long-term current use of insulin ([place])   [facility] Juline Patch, MD   3 months ago Type 2 diabetes mellitus without complication, without long-term current use of insulin ([name]   [facility] Juline Patch, MD   4 months ago Acute low back pain without sciatica, unspecified back pain laterality   [facility] Juline Patch, MD  Future Appointments             In 3 weeks Juline Patch, MD [facility] [place]

## 2021-03-22 ENCOUNTER — Telehealth: Payer: Self-pay

## 2021-03-22 NOTE — Telephone Encounter (Signed)
I called Shelanda after talking to Ginger. Pt has an upcoming appt with GYN on 17 and 18th. She is to see them first. But go ahead and call Dr Dorothey Baseman office and get in line for after that to discuss the chronic pancreatitis. Shelanda voiced understanding

## 2021-04-02 DIAGNOSIS — S32050D Wedge compression fracture of fifth lumbar vertebra, subsequent encounter for fracture with routine healing: Secondary | ICD-10-CM | POA: Diagnosis not present

## 2021-04-02 DIAGNOSIS — M25552 Pain in left hip: Secondary | ICD-10-CM | POA: Diagnosis not present

## 2021-04-02 DIAGNOSIS — M25551 Pain in right hip: Secondary | ICD-10-CM | POA: Diagnosis not present

## 2021-04-02 DIAGNOSIS — S32010D Wedge compression fracture of first lumbar vertebra, subsequent encounter for fracture with routine healing: Secondary | ICD-10-CM | POA: Diagnosis not present

## 2021-04-02 DIAGNOSIS — Z7982 Long term (current) use of aspirin: Secondary | ICD-10-CM | POA: Diagnosis not present

## 2021-04-02 DIAGNOSIS — S32050A Wedge compression fracture of fifth lumbar vertebra, initial encounter for closed fracture: Secondary | ICD-10-CM | POA: Diagnosis not present

## 2021-04-02 DIAGNOSIS — X500XXD Overexertion from strenuous movement or load, subsequent encounter: Secondary | ICD-10-CM | POA: Diagnosis not present

## 2021-04-02 DIAGNOSIS — S32020G Wedge compression fracture of second lumbar vertebra, subsequent encounter for fracture with delayed healing: Secondary | ICD-10-CM | POA: Diagnosis not present

## 2021-04-02 DIAGNOSIS — S32030G Wedge compression fracture of third lumbar vertebra, subsequent encounter for fracture with delayed healing: Secondary | ICD-10-CM | POA: Diagnosis not present

## 2021-04-02 DIAGNOSIS — Z79899 Other long term (current) drug therapy: Secondary | ICD-10-CM | POA: Diagnosis not present

## 2021-04-02 DIAGNOSIS — S32020D Wedge compression fracture of second lumbar vertebra, subsequent encounter for fracture with routine healing: Secondary | ICD-10-CM | POA: Diagnosis not present

## 2021-04-08 ENCOUNTER — Ambulatory Visit
Admission: RE | Admit: 2021-04-08 | Discharge: 2021-04-08 | Disposition: A | Discharge status: Home / Self Care | Payer: Medicare Other | Source: Ambulatory Visit | Attending: Family Medicine | Admitting: Family Medicine

## 2021-04-08 ENCOUNTER — Other Ambulatory Visit: Payer: Self-pay

## 2021-04-08 DIAGNOSIS — M810 Age-related osteoporosis without current pathological fracture: Secondary | ICD-10-CM | POA: Diagnosis not present

## 2021-04-08 DIAGNOSIS — Z780 Asymptomatic menopausal state: Secondary | ICD-10-CM | POA: Insufficient documentation

## 2021-04-08 DIAGNOSIS — M8588 Other specified disorders of bone density and structure, other site: Secondary | ICD-10-CM | POA: Insufficient documentation

## 2021-04-08 DIAGNOSIS — S32000D Wedge compression fracture of unspecified lumbar vertebra, subsequent encounter for fracture with routine healing: Secondary | ICD-10-CM | POA: Insufficient documentation

## 2021-04-08 DIAGNOSIS — M85832 Other specified disorders of bone density and structure, left forearm: Secondary | ICD-10-CM | POA: Diagnosis not present

## 2021-04-09 ENCOUNTER — Other Ambulatory Visit: Payer: Self-pay

## 2021-04-09 DIAGNOSIS — Z20822 Contact with and (suspected) exposure to covid-19: Secondary | ICD-10-CM | POA: Diagnosis not present

## 2021-04-09 DIAGNOSIS — M810 Age-related osteoporosis without current pathological fracture: Secondary | ICD-10-CM

## 2021-04-09 MED ORDER — ALENDRONATE SODIUM 70 MG PO TABS: 70.0000 mg | ORAL_TABLET | ORAL | 0 refills | Status: DC

## 2021-04-09 NOTE — Progress Notes (Signed)
Sent in fosamax

## 2021-04-15 ENCOUNTER — Ambulatory Visit (INDEPENDENT_AMBULATORY_CARE_PROVIDER_SITE_OTHER): Payer: Medicare Other | Admitting: Family Medicine

## 2021-04-15 ENCOUNTER — Other Ambulatory Visit: Payer: Self-pay

## 2021-04-15 ENCOUNTER — Encounter: Payer: Self-pay | Admitting: Family Medicine

## 2021-04-15 ENCOUNTER — Ambulatory Visit: Payer: Medicare Other | Admitting: Family Medicine

## 2021-04-15 VITALS — BP 138/62 | HR 84 | Ht 67.0 in | Wt 129.0 lb

## 2021-04-15 DIAGNOSIS — I10 Essential (primary) hypertension: Secondary | ICD-10-CM | POA: Diagnosis not present

## 2021-04-15 DIAGNOSIS — E119 Type 2 diabetes mellitus without complications: Secondary | ICD-10-CM | POA: Diagnosis not present

## 2021-04-15 DIAGNOSIS — E7800 Pure hypercholesterolemia, unspecified: Secondary | ICD-10-CM | POA: Diagnosis not present

## 2021-04-15 DIAGNOSIS — R112 Nausea with vomiting, unspecified: Secondary | ICD-10-CM

## 2021-04-15 DIAGNOSIS — F5102 Adjustment insomnia: Secondary | ICD-10-CM | POA: Diagnosis not present

## 2021-04-15 DIAGNOSIS — N83202 Unspecified ovarian cyst, left side: Secondary | ICD-10-CM | POA: Diagnosis not present

## 2021-04-15 DIAGNOSIS — D649 Anemia, unspecified: Secondary | ICD-10-CM

## 2021-04-15 DIAGNOSIS — K861 Other chronic pancreatitis: Secondary | ICD-10-CM

## 2021-04-15 DIAGNOSIS — M810 Age-related osteoporosis without current pathological fracture: Secondary | ICD-10-CM

## 2021-04-15 DIAGNOSIS — F329 Major depressive disorder, single episode, unspecified: Secondary | ICD-10-CM

## 2021-04-15 MED ORDER — LOVASTATIN 40 MG PO TABS: 40.0000 mg | ORAL_TABLET | Freq: Every evening | ORAL | 1 refills | Status: DC

## 2021-04-15 MED ORDER — HYDROXYZINE HCL 10 MG PO TABS: 10.0000 mg | ORAL_TABLET | Freq: Every evening | ORAL | 1 refills | Status: DC | PRN

## 2021-04-15 MED ORDER — PANTOPRAZOLE SODIUM 40 MG PO TBEC: 40.0000 mg | DELAYED_RELEASE_TABLET | Freq: Every day | ORAL | 1 refills | Status: DC

## 2021-04-15 MED ORDER — SERTRALINE HCL 25 MG PO TABS: 25.0000 mg | ORAL_TABLET | Freq: Every day | ORAL | 1 refills | Status: DC

## 2021-04-15 MED ORDER — LOSARTAN POTASSIUM 50 MG PO TABS: 50.0000 mg | ORAL_TABLET | Freq: Every day | ORAL | 1 refills | Status: DC

## 2021-04-15 MED ORDER — ALENDRONATE SODIUM 70 MG PO TABS: 70.0000 mg | ORAL_TABLET | ORAL | 0 refills | Status: DC

## 2021-04-15 MED ORDER — FISH OIL 1000 MG PO CAPS: 1.0000 | ORAL_CAPSULE | Freq: Every day | ORAL | 3 refills | Status: DC

## 2021-04-15 NOTE — Progress Notes (Addendum)
Date:  04/15/2021   Name:  Cristina Morgan   DOB:  04-Nov-1934   MRN:  [id number]   Chief Complaint: Osteoporosis  Patient is a 87year old female who presents for a abnormal abdominal MRI/abnormal pelvic ultrasound/unexpected weight loss exam. The patient reports the following problems: Nonintentional weight loss with abnormal pelvic MRI suggesting chronic pancreatitis, and pelvic ultrasound suggesting a complex ovarian cyst on the left may be suspicious for neoplasm.[facility] maintenance has been reviewed osteoporosis noted and patient has been started on Fosamax     Lab Results  Component Value Date   NA 138 02/01/2021   K 4.3 02/01/2021   CO2 23 02/01/2021   GLUCOSE 178 (H) 02/01/2021   BUN 19 02/01/2021   CREATININE 0.84 02/01/2021   CALCIUM 11.1 (H) 02/18/2021   EGFR 68 02/01/2021   GFRNONAA 78 12/02/2019   Lab Results  Component Value Date   CHOL 176 07/24/2020   HDL 74 07/24/2020   LDLCALC 90 07/24/2020   TRIG 63 07/24/2020   CHOLHDL 2.7 03/01/2018   No results found for: TSH Lab Results  Component Value Date   HGBA1C 5.9 (H) 02/01/2021   Lab Results  Component Value Date   WBC 5.0 02/18/2021   HGB 10.3 (L) 02/18/2021   HCT 31.8 (L) 02/18/2021   MCV 98 (H) 02/18/2021   PLT 313 02/18/2021   Lab Results  Component Value Date   ALT 9 02/01/2021   AST 16 02/01/2021   ALKPHOS 102 02/01/2021   BILITOT 0.3 02/01/2021   No results found for: 25OHVITD2, 25OHVITD3, VD25OH   Review of Systems  Constitutional:  Positive for fatigue and unexpected weight change. Negative for chills and fever.  HENT:  Negative for drooling, ear discharge, ear pain and sore throat.   Respiratory:  Negative for cough, shortness of breath and wheezing.   Cardiovascular:  Negative for chest pain, palpitations and leg swelling.  Gastrointestinal:  Negative for abdominal pain, blood in stool, constipation, diarrhea and nausea.  Endocrine: Negative for polydipsia.  Genitourinary:   Negative for dysuria, frequency, hematuria and urgency.  Musculoskeletal:  Negative for back pain, myalgias and neck pain.  Skin:  Negative for rash.  Allergic/Immunologic: Negative for environmental allergies.  Neurological:  Negative for dizziness and headaches.  Hematological:  Does not bruise/bleed easily.  Psychiatric/Behavioral:  Negative for suicidal ideas. The patient is not nervous/anxious.    Patient Active Problem List   Diagnosis Date Noted   Closed compression fracture of second lumbar vertebra ([place]) 10/28/2020   Need for vaccination against Streptococcus pneumoniae using pneumococcal conjugate vaccine 13 01/07/2017   Primary osteoarthritis of right knee 12/08/2016   PNA (pneumonia) 07/09/2015   Acute chest pain 03/20/2014   Type 2 diabetes mellitus ([place]) 03/20/2014   Hypercholesterolemia 03/20/2014   Essential hypertension 03/20/2014    Allergies  Allergen Reactions   Latex Rash   Penicillins Other (See Comments)    Past Surgical History:  Procedure Laterality Date   CATARACT EXTRACTION Bilateral 2014   COLONOSCOPY  2011   cleared for 5 yrs- [facility]   ECTOPIC PREGNANCY SURGERY      Social History   Tobacco Use   Smoking status: Never   Smokeless tobacco: Never   Tobacco comments:    smoking cessation materials not required  Vaping Use   Vaping Use: Never used  Substance Use Topics   Alcohol use: No    Alcohol/week: 0.0 standard drinks   Drug use: No  Medication list has been reviewed and updated.  No outpatient medications have been marked as taking for the 04/15/21 encounter (Office Visit) with Juline Patch, MD.    [facility] 2/9 Scores 02/01/2021 12/12/2020 11/07/2020 07/24/2020  PHQ - 2 Score 0 0 0 0  PHQ- 9 Score 2 4 - 0    GAD 7 : Generalized Anxiety Score 02/01/2021 12/12/2020 07/24/2020 04/09/2020  Nervous, Anxious, on Edge 0 0 1 0  Control/stop worrying 0 0 0 0  Worry too much - different things 0 0 0 0  Trouble relaxing 1 0 0 0  Restless -  0 0 0  Easily annoyed or irritable 0 0 0 0  Afraid - awful might happen 0 0 0 0  Total GAD 7 Score - 0 1 0  Anxiety Difficulty - - Not difficult at all Not difficult at all    BP Readings from Last 3 Encounters:  04/15/21 (!) 158/62  02/22/21 124/60  02/21/21 (!) 142/61    Physical Exam Vitals and nursing note reviewed. Exam conducted with a chaperone present.  Constitutional:      General: She is not in acute distress.    Appearance: She is not diaphoretic.  HENT:     Head: Normocephalic and atraumatic.     Right Ear: External ear normal.     Left Ear: External ear normal.     Nose: Nose normal. No congestion or rhinorrhea.  Eyes:     General:        Right eye: No discharge.        Left eye: No discharge.     Conjunctiva/sclera: Conjunctivae normal.     Pupils: Pupils are equal, round, and reactive to light.  Neck:     Thyroid: No thyromegaly.     Vascular: No JVD.  Cardiovascular:     Rate and Rhythm: Normal rate and regular rhythm.     Heart sounds: Normal heart sounds. No murmur heard.   No friction rub. No gallop.  Pulmonary:     Effort: Pulmonary effort is normal.     Breath sounds: Normal breath sounds. No wheezing or rhonchi.  Abdominal:     General: Bowel sounds are normal.     Palpations: Abdomen is soft. There is no hepatomegaly, splenomegaly or mass.     Tenderness: There is no abdominal tenderness. There is no guarding.  Musculoskeletal:        General: Normal range of motion.     Cervical back: Normal range of motion and neck supple.  Lymphadenopathy:     Cervical: No cervical adenopathy.  Skin:    General: Skin is warm and dry.  Neurological:     Mental Status: She is alert.     Deep Tendon Reflexes: Reflexes are normal and symmetric.    Wt Readings from Last 3 Encounters:  04/15/21 129 lb (58.5 kg)  02/22/21 135 lb (61.2 kg)  02/21/21 135 lb (61.2 kg)    BP (!) 158/62    Pulse 84    Ht '5\' 7"'  (1.702 m)    Wt 129 lb (58.5 kg)    BMI 20.20  kg/m   Assessment and Plan:  1. Age related osteoporosis, unspecified pathological fracture presence Chronic.  Controlled.  Stable.  We will initiate Fosamax 70 mg once weekly. - alendronate (FOSAMAX) 70 MG tablet; Take 1 tablet (70 mg total) by mouth every 7 (seven) days. Take with a full glass of water on an empty stomach.  Dispense: 4  tablet; Refill: 0  2. Essential hypertension Chronic.  Controlled.  Stable.  Blood pressure today is 138/62.  Continue losartan 50 mg once a day - losartan (COZAAR) 50 MG tablet; Take 1 tablet (50 mg total) by mouth daily.  Dispense: 90 tablet; Refill: 1  3. Type 2 diabetes mellitus without complication, without long-term current use of insulin (HCC) Continue losartan 50 mg once a day for protection from nephro toxicity of diabetes. - losartan (COZAAR) 50 MG tablet; Take 1 tablet (50 mg total) by mouth daily.  Dispense: 90 tablet; Refill: 1  4. Adjustment insomnia Chronic we will check CBC lipase and hepatic panel for current status.  Continue hydroxyzine 10 mg nightly as well as sertraline 25 mg daily. - hydrOXYzine (ATARAX) 10 MG tablet; Take 1 tablet (10 mg total) by mouth at bedtime as needed.  Dispense: 90 tablet; Refill: 1 - sertraline (ZOLOFT) 25 MG tablet; Take 1 tablet (25 mg total) by mouth daily.  Dispense: 90 tablet; Refill: 1  5. Anemia, unspecified type Noted decrease color of mucous membranes and nailbeds.  Will check CBC for current status of CBC. - CBC with Differential/Platelet  6. Left ovarian cyst Left ovarian cyst noted on ultrasound of the pelvis which is complex in nature and may have characteristics of a neoplasm.  Further evaluation with MRI of the pelvis with and without contrast has been suggested by radiology and we will proceed. - MR Pelvis W Wo Contrast  7. Idiopathic chronic pancreatitis (HCC) Chronic.  Controlled.  Stable.  Will check lipase. - CBC with Differential/Platelet - Lipase - Hepatic Function Panel (6)  8.  Reactive depression Continue sertraline 25 mg daily. - sertraline (ZOLOFT) 25 MG tablet; Take 1 tablet (25 mg total) by mouth daily.  Dispense: 90 tablet; Refill: 1  9. Hypercholesterolemia Chronic.  Controlled.  Stable.  Continue lovastatin 40 mg once a day and omega-3. - lovastatin (MEVACOR) 40 MG tablet; Take 1 tablet (40 mg total) by mouth every evening.  Dispense: 90 tablet; Refill: 1 - Omega-3 Fatty Acids (FISH OIL) 1000 MG CAPS; Take 1 capsule (1,000 mg total) by mouth daily.  Dispense: 100 capsule; Refill: 3  10. Nausea and vomiting, unspecified vomiting type Chronic.  Controlled.  Stable.  Continue pantoprazole 40 mg once a day - pantoprazole (PROTONIX) 40 MG tablet; Take 1 tablet (40 mg total) by mouth daily.  Dispense: 90 tablet; Refill: 1

## 2021-04-15 NOTE — Addendum Note (Signed)
Addended by: Juline Patch on: 04/15/2021 03:43 PM   Modules accepted: Orders

## 2021-04-16 LAB — CBC WITH DIFFERENTIAL/PLATELET
Basophils Absolute: 0 10*3/uL (ref 0.0–0.2)
Basos: 1 %
EOS (ABSOLUTE): 0.1 10*3/uL (ref 0.0–0.4)
Eos: 1 %
Hematocrit: 29.8 % — ABNORMAL LOW (ref 34.0–46.6)
Hemoglobin: 9.5 g/dL — ABNORMAL LOW (ref 11.1–15.9)
Immature Grans (Abs): 0 10*3/uL (ref 0.0–0.1)
Immature Granulocytes: 0 %
Lymphocytes Absolute: 2.6 10*3/uL (ref 0.7–3.1)
Lymphs: 36 %
MCH: 31.4 pg (ref 26.6–33.0)
MCHC: 31.9 g/dL (ref 31.5–35.7)
MCV: 98 fL — ABNORMAL HIGH (ref 79–97)
Monocytes Absolute: 0.7 10*3/uL (ref 0.1–0.9)
Monocytes: 10 %
Neutrophils Absolute: 3.7 10*3/uL (ref 1.4–7.0)
Neutrophils: 52 %
Platelets: 291 10*3/uL (ref 150–450)
RBC: 3.03 x10E6/uL — ABNORMAL LOW (ref 3.77–5.28)
RDW: 11.9 % (ref 11.7–15.4)
WBC: 7.1 10*3/uL (ref 3.4–10.8)

## 2021-04-16 LAB — HEPATIC FUNCTION PANEL (6)
ALT: 8 IU/L (ref 0–32)
AST: 17 IU/L (ref 0–40)
Albumin: 4.4 g/dL (ref 3.6–4.6)
Alkaline Phosphatase: 108 IU/L (ref 44–121)
Bilirubin Total: 0.3 mg/dL (ref 0.0–1.2)
Bilirubin, Direct: 0.1 mg/dL (ref 0.00–0.40)

## 2021-04-16 LAB — LIPASE: Lipase: 114 U/L — ABNORMAL HIGH (ref 14–85)

## 2021-04-17 LAB — B12 AND FOLATE PANEL
Folate: >20 ng/mL (ref >3.0)
Vitamin B-12: 195 pg/mL — ABNORMAL LOW (ref 232–1245)

## 2021-04-17 LAB — SPECIMEN STATUS REPORT

## 2021-04-17 LAB — FERRITIN: Ferritin: 115 ng/mL (ref 15–150)

## 2021-04-18 DIAGNOSIS — S2242XA Multiple fractures of ribs, left side, initial encounter for closed fracture: Secondary | ICD-10-CM | POA: Diagnosis not present

## 2021-04-18 DIAGNOSIS — R0789 Other chest pain: Secondary | ICD-10-CM | POA: Diagnosis not present

## 2021-04-18 DIAGNOSIS — I509 Heart failure, unspecified: Secondary | ICD-10-CM | POA: Diagnosis not present

## 2021-04-18 DIAGNOSIS — R918 Other nonspecific abnormal finding of lung field: Secondary | ICD-10-CM | POA: Diagnosis not present

## 2021-04-22 ENCOUNTER — Other Ambulatory Visit: Payer: Self-pay | Admitting: Family Medicine

## 2021-04-22 DIAGNOSIS — E119 Type 2 diabetes mellitus without complications: Secondary | ICD-10-CM

## 2021-04-22 DIAGNOSIS — I10 Essential (primary) hypertension: Secondary | ICD-10-CM

## 2021-04-22 DIAGNOSIS — M1711 Unilateral primary osteoarthritis, right knee: Secondary | ICD-10-CM

## 2021-04-26 ENCOUNTER — Ambulatory Visit
Admission: RE | Admit: 2021-04-26 | Discharge: 2021-04-26 | Disposition: A | Discharge status: Home / Self Care | Payer: Medicare Other | Source: Ambulatory Visit | Attending: Family Medicine | Admitting: Family Medicine

## 2021-04-26 ENCOUNTER — Other Ambulatory Visit: Payer: Self-pay

## 2021-04-26 DIAGNOSIS — R634 Abnormal weight loss: Secondary | ICD-10-CM | POA: Diagnosis not present

## 2021-04-26 DIAGNOSIS — N83202 Unspecified ovarian cyst, left side: Secondary | ICD-10-CM | POA: Diagnosis not present

## 2021-04-26 DIAGNOSIS — N83201 Unspecified ovarian cyst, right side: Secondary | ICD-10-CM | POA: Diagnosis not present

## 2021-04-26 DIAGNOSIS — D259 Leiomyoma of uterus, unspecified: Secondary | ICD-10-CM | POA: Diagnosis not present

## 2021-04-26 IMAGING — MR MR PELVIS WO/W CM
13 of 17 series · 34 of 48 positions shown · IV contrast (gadavist)
Comparison: Ultrasound on [DATE]

CLINICAL DATA: Cystic left adnexal lesion on recent ultrasound.
Fibroids. Weight loss.

EXAM:
MRI PELVIS WITHOUT AND WITH CONTRAST
TECHNIQUE: Multiplanar multisequence MR imaging of the pelvis was performed
both before and after administration of intravenous contrast.
CONTRAST:  6mL GADAVIST GADOBUTROL 1 MMOL/ML IV SOLN

[Series 2: T2 · coronal · 5.0mm · 1.41mm/px · 1 of 30 slices shown (1 of 3)]
[im 1/30]
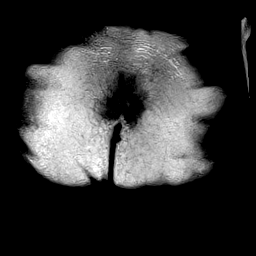

[Series 3: T2 · coronal · 5.0mm · 0.69mm/px · 1 of 31 slices shown (2 of 3)]
[im 1/31]
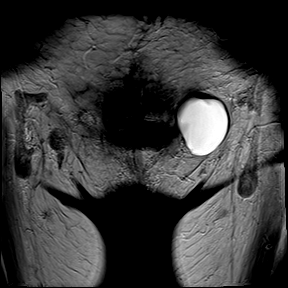

[Series 4: T2 · axial · 5.0mm · 0.75mm/px · 1 of 37 slices shown (3 of 3)]
[im 1/37]
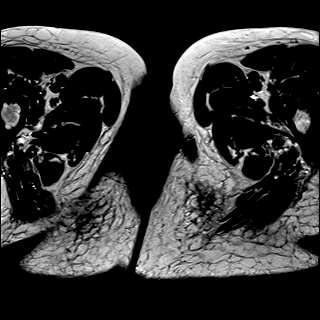

[Series 5: T2 fat-sat · axial · 5.0mm · 0.90mm/px · z∈[-95,+121]mm · 2 of 37 slices shown]
[im 1/37]
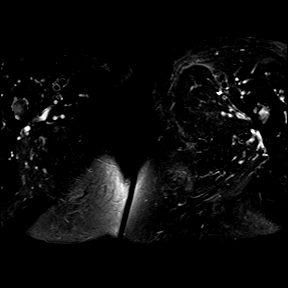
[im 37/37]
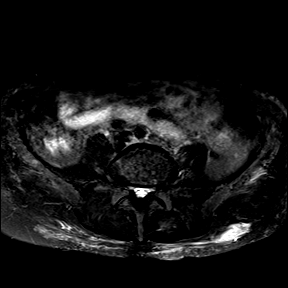

[Series 6: sag tse · sagittal · 5.0mm · 0.75mm/px · 2 of 33 slices shown]
[im 1/33]
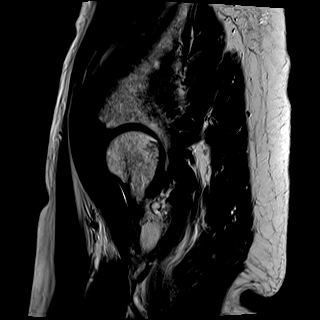
[im 33/33]
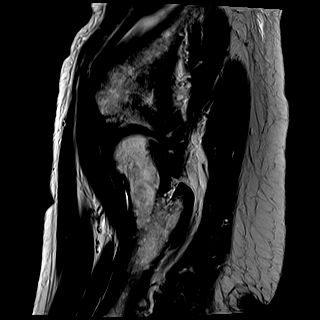

[Series 7: ax dwi_tracew · axial · 6.0mm · 1.14mm/px · z∈[-106,+132]mm · 5 of 102 slices shown]
[im 1/102]
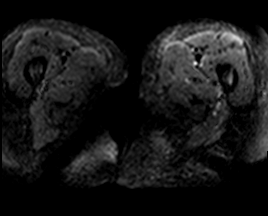
[im 26/102]
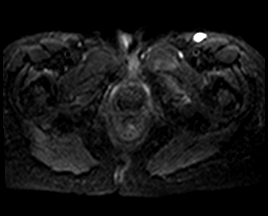
[im 51/102]
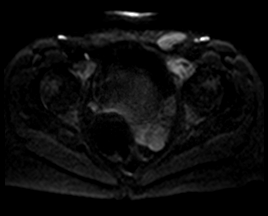
[im 76/102]
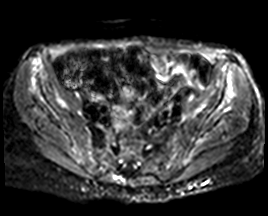
[im 102/102]
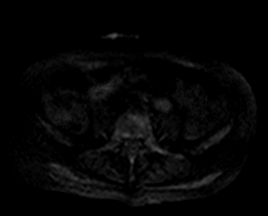

[Series 8: ax dwi_adc · axial · 6.0mm · 1.14mm/px · z∈[-106,+132]mm · 2 of 34 slices shown]
[im 1/34]
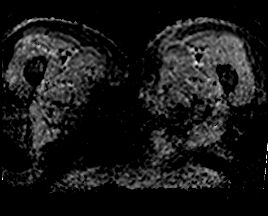
[im 34/34]
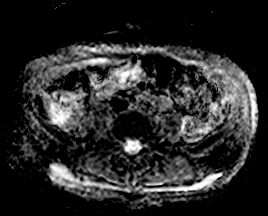

[Series 9: DIXON · axial · 3.0mm · 1.19mm/px · z∈[-94,+119]mm · 3 of 72 slices shown (1 of 2)]
[im 1/72]
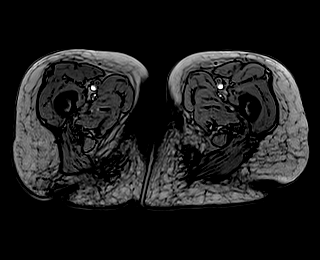
[im 36/72]
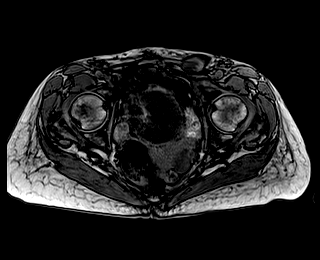
[im 72/72]
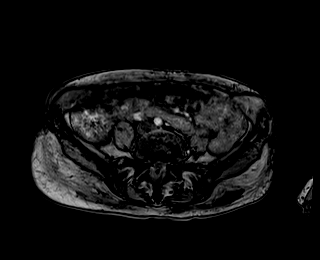

[Series 10: DIXON · axial · 3.0mm · 1.19mm/px · z∈[-94,+119]mm · 3 of 72 slices shown (2 of 2)]
[im 1/72]
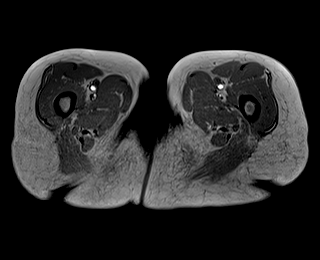
[im 36/72]
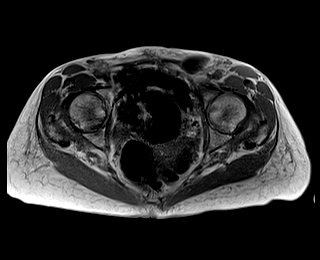
[im 72/72]
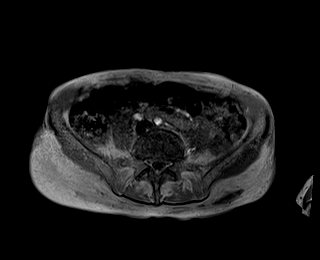

[Series 12: T1 dynamic fat-sat · axial · 3.0mm · 0.47mm/px · z∈[-106,+131]mm · 4 of 80 slices shown]
[im 1/80]
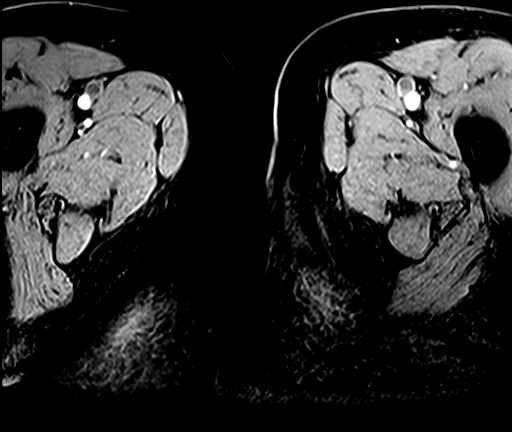
[im 27/80]
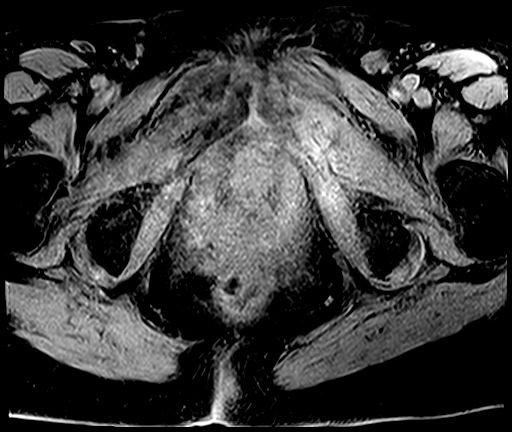
[im 53/80]
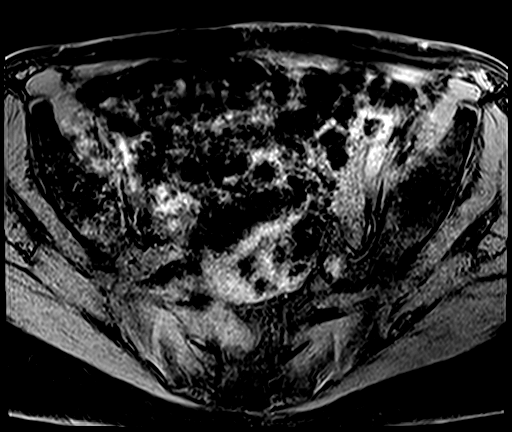
[im 80/80]
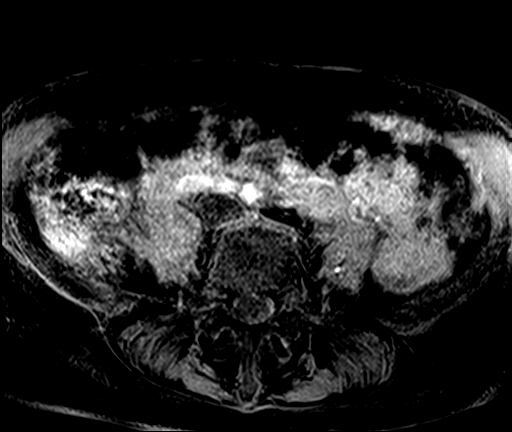

[Series 16: T1 dynamic post-contrast · axial · 3.0mm · 0.47mm/px · z∈[-106,+131]mm · 4 of 80 slices shown (1 of 3)]
[im 1/80]
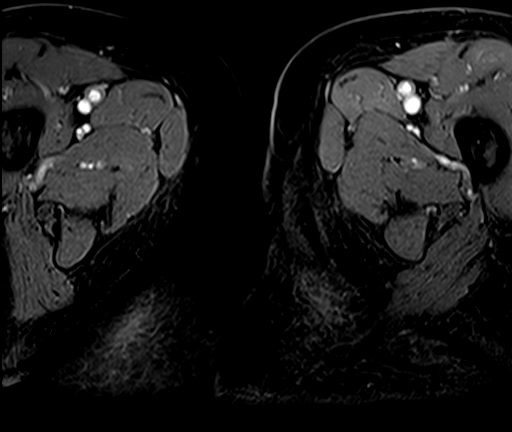
[im 27/80]
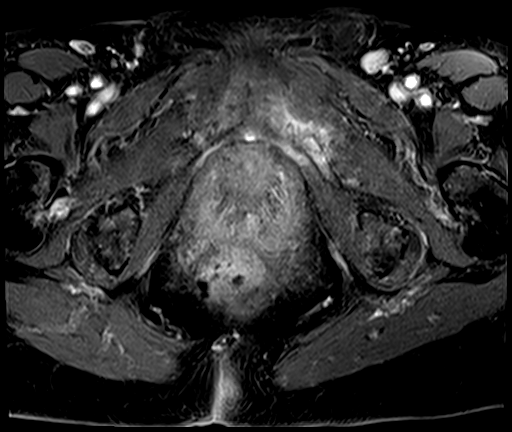
[im 53/80]
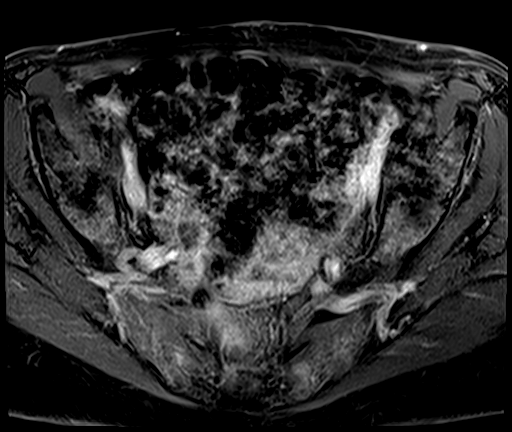
[im 80/80]
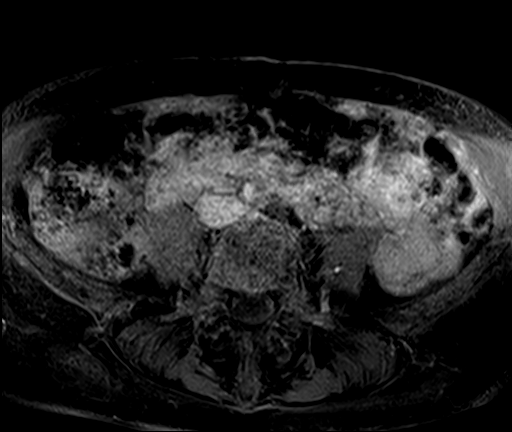

[Series 18: T1 dynamic post-contrast · axial · 3.0mm · 0.47mm/px · z∈[-106,+131]mm · 4 of 80 slices shown (2 of 3)]
[im 1/80]
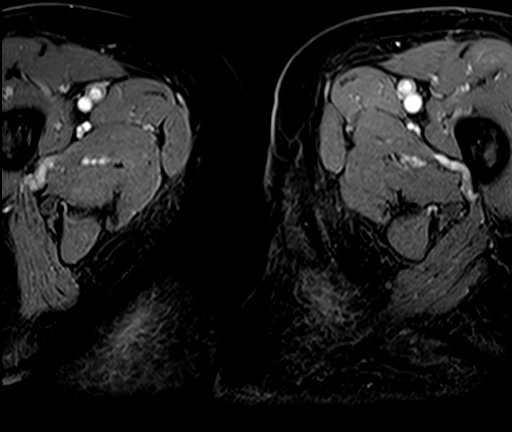
[im 27/80]
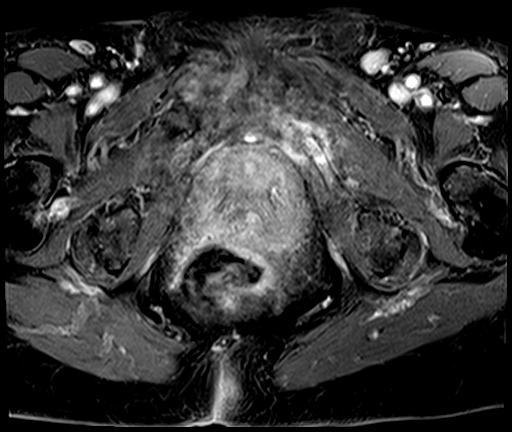
[im 53/80]
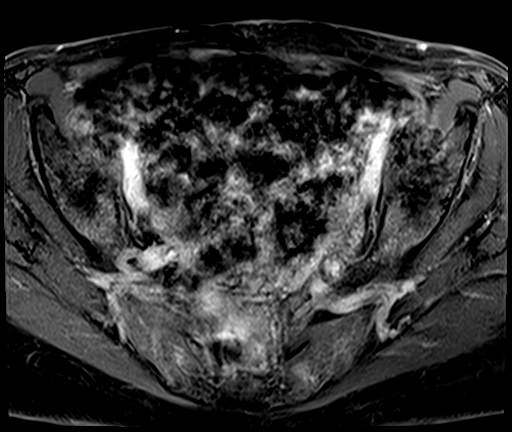
[im 80/80]
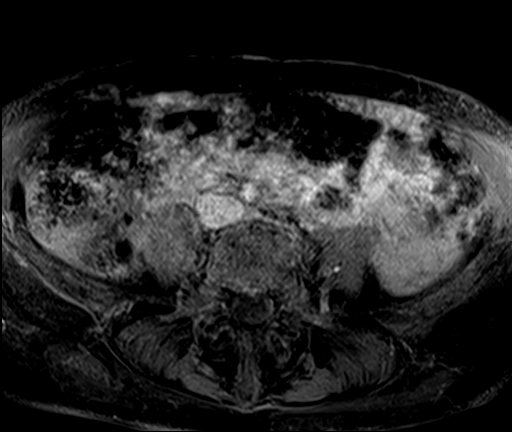

[Series 19: T1 dynamic post-contrast · axial · 3.0mm · 0.47mm/px · z∈[-106,-28]mm · 2 of 80 slices shown (3 of 3)]
[im 1/80]
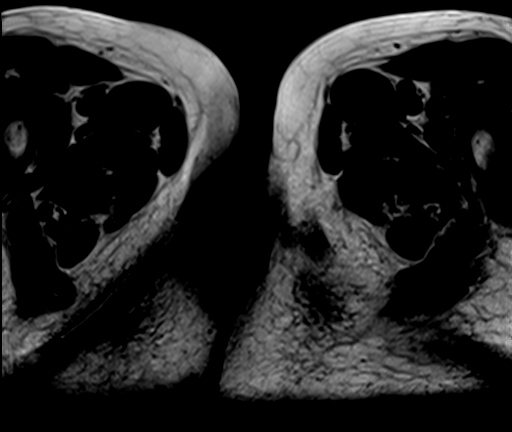
[im 27/80]
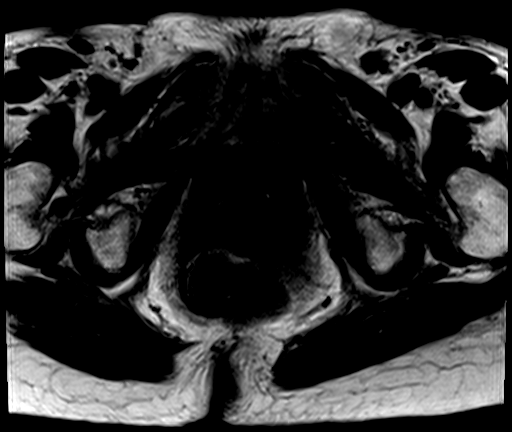

[34 of 48 positions shown; findings below may reference images not displayed]

FINDINGS: Lower Urinary Tract: Incompletely distended urinary bladder, but
otherwise unremarkable.

Bowel: Unremarkable pelvic bowel loops.

Vascular/Lymphatic: Unremarkable. No pathologically enlarged pelvic
lymph nodes identified.

Reproductive:

-- Uterus: Measures 4.5 x 2.8 by 3.9 cm (volume = 26 cm^3). A few
small uterine fibroids are seen, largest measuring 1.4 cm in maximum
diameter. No abnormal endometrial thickening seen. Cervix and vagina
are unremarkable.

-- Right ovary: A 1.5 cm simple cyst is seen in the right ovary. No
other cystic or solid masses are identified.

-- Left ovary: A complex cystic lesion is seen in the left ovary
which measures 3.6 x 3.0 by 2.3 cm. This contains several thin
internal septations, and a 8 mm solid mural nodule with probable
mild contrast enhancement. This is consistent with a cystic ovarian
neoplasm.

Other: No peritoneal thickening or abnormal free fluid. A loculated
fluid collection is noted within the left inguinal canal which
measures 3.6 x 1.6 cm, consistent with a hydrocele of the canal of
[place].

Musculoskeletal:  Unremarkable.
IMPRESSION: 3.6 cm complex cystic lesion in the left ovary, with thin septations
and small solid mural nodule, consistent with cystic ovarian
neoplasm. Surgical evaluation should be considered.

1.5 cm benign-appearing cyst in the right ovary.

A few small uterine fibroids, largest measuring 1.4 cm.

Incidentally noted small left-sided hydrocele of the canal of [place].

## 2021-04-26 MED ORDER — GADOBUTROL 1 MMOL/ML IV SOLN
6.0000 mL | Freq: Once | INTRAVENOUS | Status: AC | PRN
  Administered 2021-04-26: 6 mL via INTRAVENOUS

## 2021-05-01 ENCOUNTER — Inpatient Hospital Stay
Admission: EM | Admit: 2021-05-01 | Discharge: 2021-05-07 | DRG: 840 | Disposition: A | Discharge status: Home Health | Payer: Medicare Other | Attending: Internal Medicine | Admitting: Internal Medicine

## 2021-05-01 ENCOUNTER — Emergency Department: Payer: Medicare Other

## 2021-05-01 ENCOUNTER — Other Ambulatory Visit: Payer: Self-pay

## 2021-05-01 DIAGNOSIS — I471 Supraventricular tachycardia, unspecified: Secondary | ICD-10-CM | POA: Clinically undetermined

## 2021-05-01 DIAGNOSIS — E1165 Type 2 diabetes mellitus with hyperglycemia: Secondary | ICD-10-CM | POA: Diagnosis not present

## 2021-05-01 DIAGNOSIS — G9341 Metabolic encephalopathy: Secondary | ICD-10-CM

## 2021-05-01 DIAGNOSIS — R1111 Vomiting without nausea: Secondary | ICD-10-CM | POA: Diagnosis not present

## 2021-05-01 DIAGNOSIS — I700 Atherosclerosis of aorta: Secondary | ICD-10-CM | POA: Diagnosis not present

## 2021-05-01 DIAGNOSIS — E860 Dehydration: Secondary | ICD-10-CM | POA: Diagnosis present

## 2021-05-01 DIAGNOSIS — Z7984 Long term (current) use of oral hypoglycemic drugs: Secondary | ICD-10-CM | POA: Diagnosis not present

## 2021-05-01 DIAGNOSIS — D649 Anemia, unspecified: Secondary | ICD-10-CM | POA: Diagnosis not present

## 2021-05-01 DIAGNOSIS — R4182 Altered mental status, unspecified: Secondary | ICD-10-CM | POA: Insufficient documentation

## 2021-05-01 DIAGNOSIS — I517 Cardiomegaly: Secondary | ICD-10-CM | POA: Diagnosis not present

## 2021-05-01 DIAGNOSIS — L89152 Pressure ulcer of sacral region, stage 2: Secondary | ICD-10-CM | POA: Diagnosis present

## 2021-05-01 DIAGNOSIS — Z91040 Latex allergy status: Secondary | ICD-10-CM | POA: Diagnosis not present

## 2021-05-01 DIAGNOSIS — E119 Type 2 diabetes mellitus without complications: Secondary | ICD-10-CM | POA: Diagnosis not present

## 2021-05-01 DIAGNOSIS — M4854XA Collapsed vertebra, not elsewhere classified, thoracic region, initial encounter for fracture: Secondary | ICD-10-CM | POA: Diagnosis present

## 2021-05-01 DIAGNOSIS — Z20822 Contact with and (suspected) exposure to covid-19: Secondary | ICD-10-CM | POA: Diagnosis present

## 2021-05-01 DIAGNOSIS — E538 Deficiency of other specified B group vitamins: Secondary | ICD-10-CM | POA: Diagnosis not present

## 2021-05-01 DIAGNOSIS — Z7982 Long term (current) use of aspirin: Secondary | ICD-10-CM

## 2021-05-01 DIAGNOSIS — N83292 Other ovarian cyst, left side: Secondary | ICD-10-CM | POA: Diagnosis present

## 2021-05-01 DIAGNOSIS — T502X5A Adverse effect of carbonic-anhydrase inhibitors, benzothiadiazides and other diuretics, initial encounter: Secondary | ICD-10-CM | POA: Diagnosis not present

## 2021-05-01 DIAGNOSIS — R5381 Other malaise: Secondary | ICD-10-CM | POA: Diagnosis not present

## 2021-05-01 DIAGNOSIS — E7800 Pure hypercholesterolemia, unspecified: Secondary | ICD-10-CM | POA: Diagnosis present

## 2021-05-01 DIAGNOSIS — Z880 Allergy status to penicillin: Secondary | ICD-10-CM

## 2021-05-01 DIAGNOSIS — Z7983 Long term (current) use of bisphosphonates: Secondary | ICD-10-CM

## 2021-05-01 DIAGNOSIS — R000 Tachycardia, unspecified: Secondary | ICD-10-CM | POA: Diagnosis not present

## 2021-05-01 DIAGNOSIS — E871 Hypo-osmolality and hyponatremia: Secondary | ICD-10-CM | POA: Clinically undetermined

## 2021-05-01 DIAGNOSIS — C9000 Multiple myeloma not having achieved remission: Secondary | ICD-10-CM | POA: Diagnosis present

## 2021-05-01 DIAGNOSIS — J9811 Atelectasis: Secondary | ICD-10-CM | POA: Diagnosis not present

## 2021-05-01 DIAGNOSIS — Z833 Family history of diabetes mellitus: Secondary | ICD-10-CM | POA: Diagnosis not present

## 2021-05-01 DIAGNOSIS — S2243XA Multiple fractures of ribs, bilateral, initial encounter for closed fracture: Secondary | ICD-10-CM | POA: Diagnosis present

## 2021-05-01 DIAGNOSIS — S2242XA Multiple fractures of ribs, left side, initial encounter for closed fracture: Secondary | ICD-10-CM | POA: Diagnosis not present

## 2021-05-01 DIAGNOSIS — M4856XA Collapsed vertebra, not elsewhere classified, lumbar region, initial encounter for fracture: Secondary | ICD-10-CM | POA: Diagnosis present

## 2021-05-01 DIAGNOSIS — I10 Essential (primary) hypertension: Secondary | ICD-10-CM | POA: Diagnosis not present

## 2021-05-01 DIAGNOSIS — Z79899 Other long term (current) drug therapy: Secondary | ICD-10-CM

## 2021-05-01 DIAGNOSIS — Z0389 Encounter for observation for other suspected diseases and conditions ruled out: Secondary | ICD-10-CM | POA: Diagnosis not present

## 2021-05-01 DIAGNOSIS — R918 Other nonspecific abnormal finding of lung field: Secondary | ICD-10-CM | POA: Diagnosis not present

## 2021-05-01 DIAGNOSIS — R911 Solitary pulmonary nodule: Secondary | ICD-10-CM | POA: Diagnosis not present

## 2021-05-01 DIAGNOSIS — R531 Weakness: Secondary | ICD-10-CM | POA: Diagnosis not present

## 2021-05-01 DIAGNOSIS — L8990 Pressure ulcer of unspecified site, unspecified stage: Secondary | ICD-10-CM | POA: Insufficient documentation

## 2021-05-01 LAB — COMPREHENSIVE METABOLIC PANEL
ALT: 12 U/L (ref 0–44)
AST: 18 U/L (ref 15–41)
Albumin: 3.8 g/dL (ref 3.5–5.0)
Alkaline Phosphatase: 88 U/L (ref 38–126)
Anion gap: 6 (ref 5–15)
BUN: 24 mg/dL — ABNORMAL HIGH (ref 8–23)
CO2: 24 mmol/L (ref 22–32)
Calcium: 11.4 mg/dL — ABNORMAL HIGH (ref 8.9–10.3)
Chloride: 105 mmol/L (ref 98–111)
Creatinine, Ser: 0.85 mg/dL (ref 0.44–1.00)
GFR, Estimated: >60 mL/min (ref >60)
Glucose, Bld: 253 mg/dL — ABNORMAL HIGH (ref 70–99)
Potassium: 3.7 mmol/L (ref 3.5–5.1)
Sodium: 135 mmol/L (ref 135–145)
Total Bilirubin: 0.7 mg/dL (ref 0.3–1.2)
Total Protein: 7.8 g/dL (ref 6.5–8.1)

## 2021-05-01 LAB — GLUCOSE, CAPILLARY
Glucose-Capillary: 248 mg/dL — ABNORMAL HIGH (ref 70–99)
Glucose-Capillary: 180 mg/dL — ABNORMAL HIGH (ref 70–99)
Glucose-Capillary: 166 mg/dL — ABNORMAL HIGH (ref 70–99)

## 2021-05-01 LAB — URINALYSIS, ROUTINE W REFLEX MICROSCOPIC
Bilirubin Urine: NEGATIVE
Glucose, UA: 150 mg/dL — AB
Hgb urine dipstick: NEGATIVE
Ketones, ur: 20 mg/dL — AB
Leukocytes,Ua: NEGATIVE
Nitrite: NEGATIVE
Protein, ur: 30 mg/dL — AB
Specific Gravity, Urine: 1.016 (ref 1.005–1.030)
pH: 6 (ref 5.0–8.0)

## 2021-05-01 LAB — CBG MONITORING, ED: Glucose-Capillary: 218 mg/dL — ABNORMAL HIGH (ref 70–99)

## 2021-05-01 LAB — RESP PANEL BY RT-PCR (FLU A&B, COVID) ARPGX2
Influenza A by PCR: NEGATIVE
Influenza B by PCR: NEGATIVE
SARS Coronavirus 2 by RT PCR: NEGATIVE

## 2021-05-01 LAB — CBC WITH DIFFERENTIAL/PLATELET
Abs Immature Granulocytes: 0.05 10*3/uL (ref 0.00–0.07)
Basophils Absolute: 0 10*3/uL (ref 0.0–0.1)
Basophils Relative: 0 %
Eosinophils Absolute: 0 10*3/uL (ref 0.0–0.5)
Eosinophils Relative: 0 %
HCT: 33.3 % — ABNORMAL LOW (ref 36.0–46.0)
Hemoglobin: 10.5 g/dL — ABNORMAL LOW (ref 12.0–15.0)
Immature Granulocytes: 1 %
Lymphocytes Relative: 26 %
Lymphs Abs: 1.7 10*3/uL (ref 0.7–4.0)
MCH: 31.3 pg (ref 26.0–34.0)
MCHC: 31.5 g/dL (ref 30.0–36.0)
MCV: 99.1 fL (ref 80.0–100.0)
Monocytes Absolute: 0.6 10*3/uL (ref 0.1–1.0)
Monocytes Relative: 9 %
Neutro Abs: 4.3 10*3/uL (ref 1.7–7.7)
Neutrophils Relative %: 64 %
Platelets: 314 10*3/uL (ref 150–400)
RBC: 3.36 MIL/uL — ABNORMAL LOW (ref 3.87–5.11)
RDW: 13.5 % (ref 11.5–15.5)
WBC: 6.7 10*3/uL (ref 4.0–10.5)
nRBC: 0 % (ref 0.0–0.2)

## 2021-05-01 LAB — LACTATE DEHYDROGENASE: LDH: 143 U/L (ref 98–192)

## 2021-05-01 LAB — LACTIC ACID, PLASMA: Lactic Acid, Venous: 1.5 mmol/L (ref 0.5–1.9)

## 2021-05-01 LAB — LIPASE, BLOOD: Lipase: 46 U/L (ref 11–51)

## 2021-05-01 IMAGING — CT CT CHEST W/O CM
2 of 4 series · 13 of 36 positions shown, 16 images · non-contrast
Comparison: None.

CLINICAL DATA: Evaluate for pneumonia.



[Series 2: thorax · axial · 0.65mm/px · z∈[-70,+142]mm · 10 of 126 slices shown, 13 images]
[im 10/126  mediastinal]
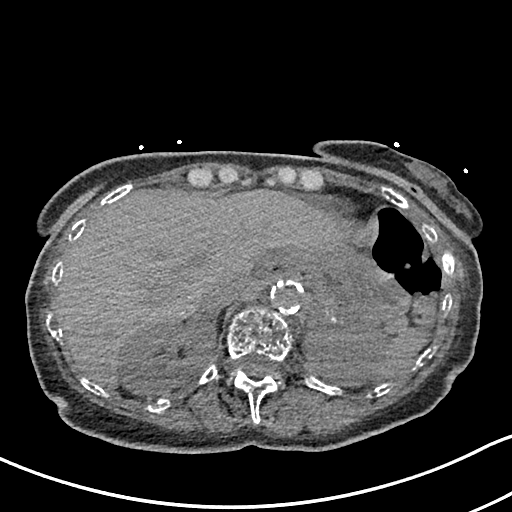
[im 10/126  lung]
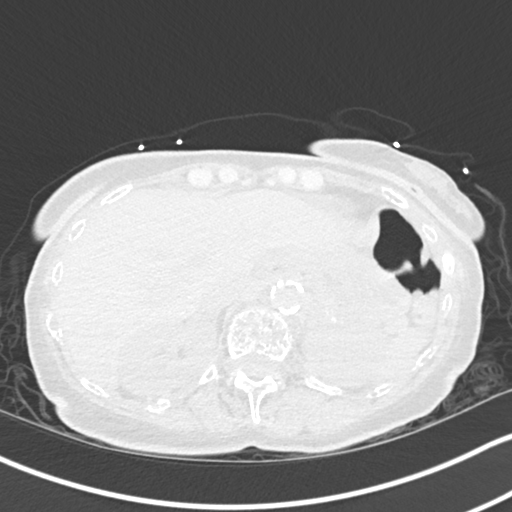
[im 20/126  lung]
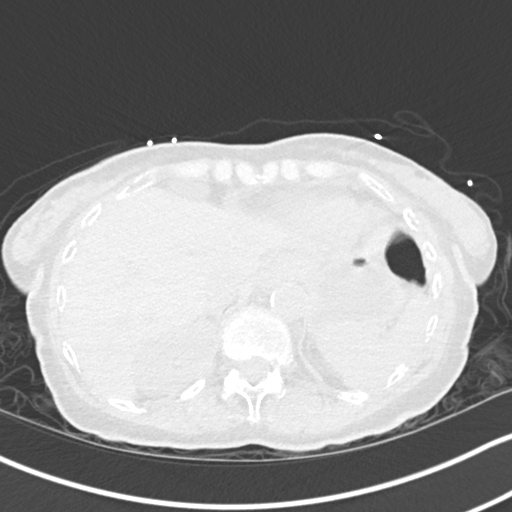
[im 39/126  lung]
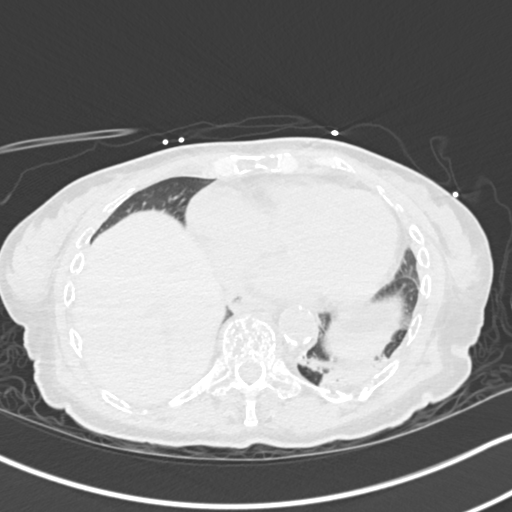
[im 49/126  lung]
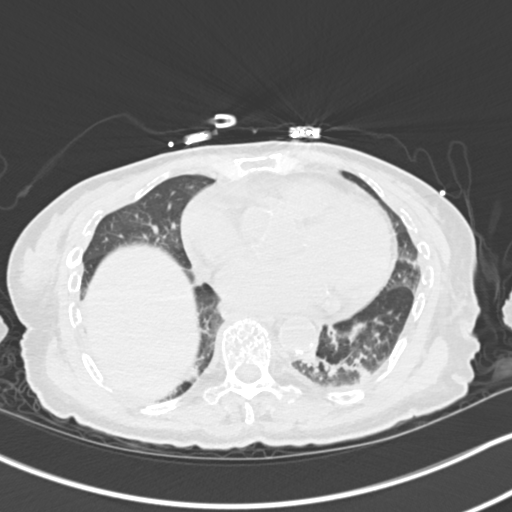
[im 58/126  mediastinal]
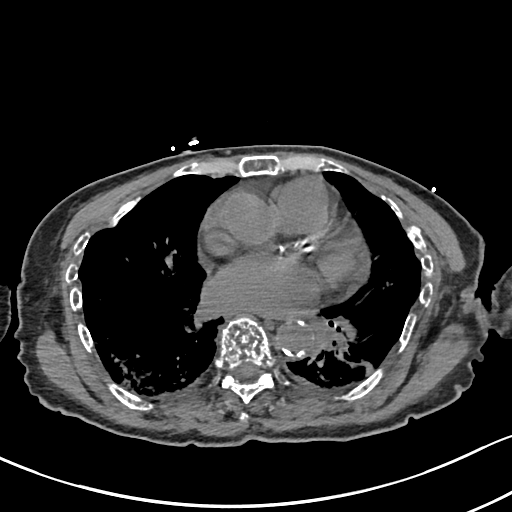
[im 58/126  lung]
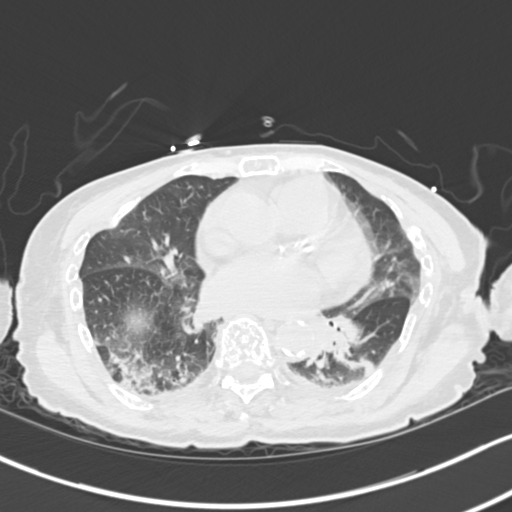
[im 68/126  lung]
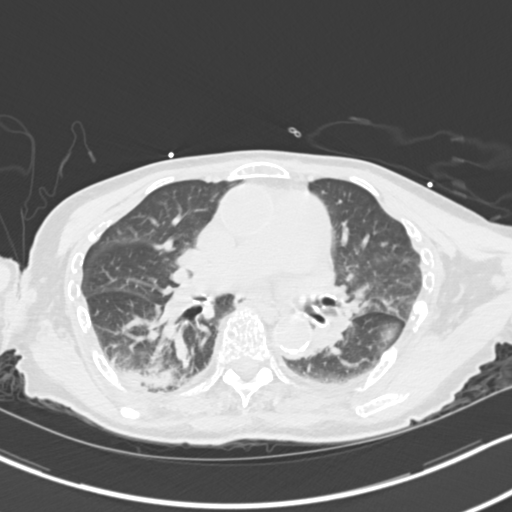
[im 77/126  lung]
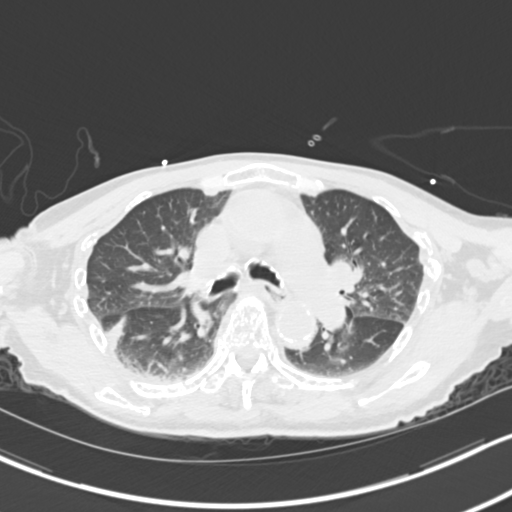
[im 97/126  lung]
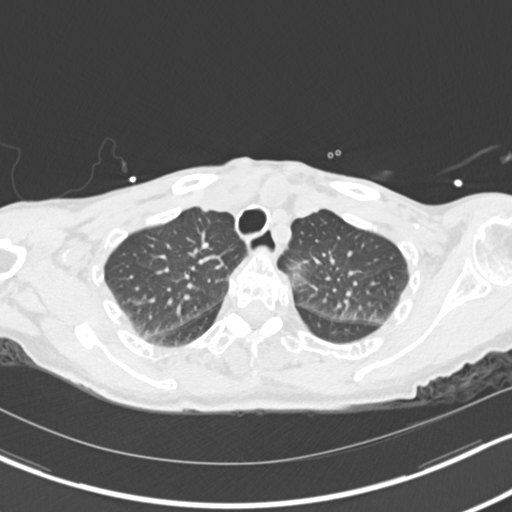
[im 106/126  mediastinal]
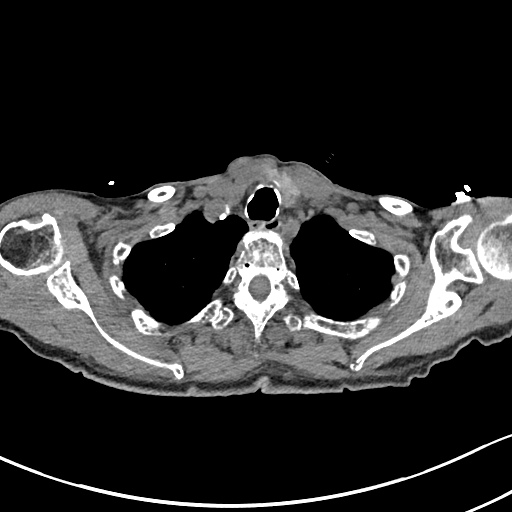
[im 106/126  lung]
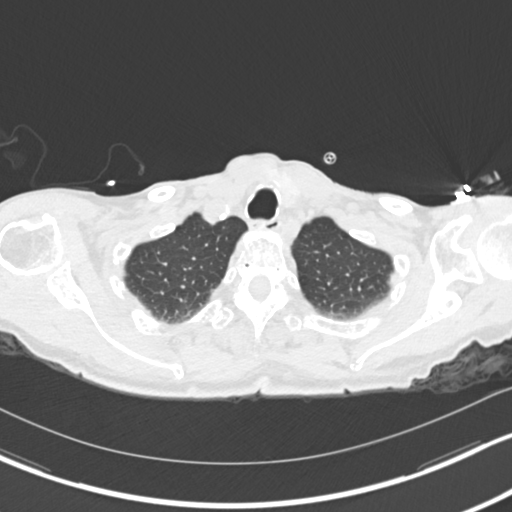
[im 116/126  lung]
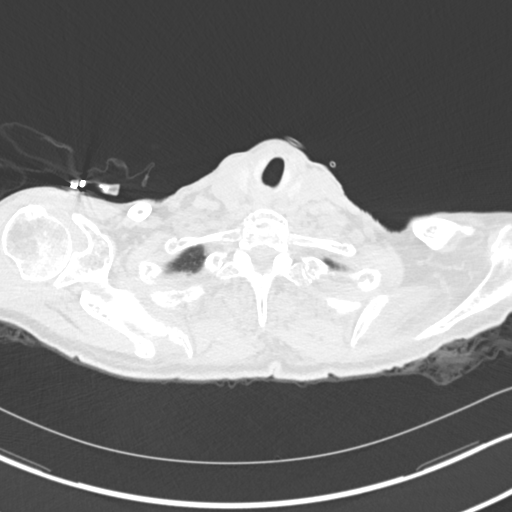

[Series 5: coronal · coronal · 0.56mm/px · 3 of 97 slices shown]
[im 20/97  lung]
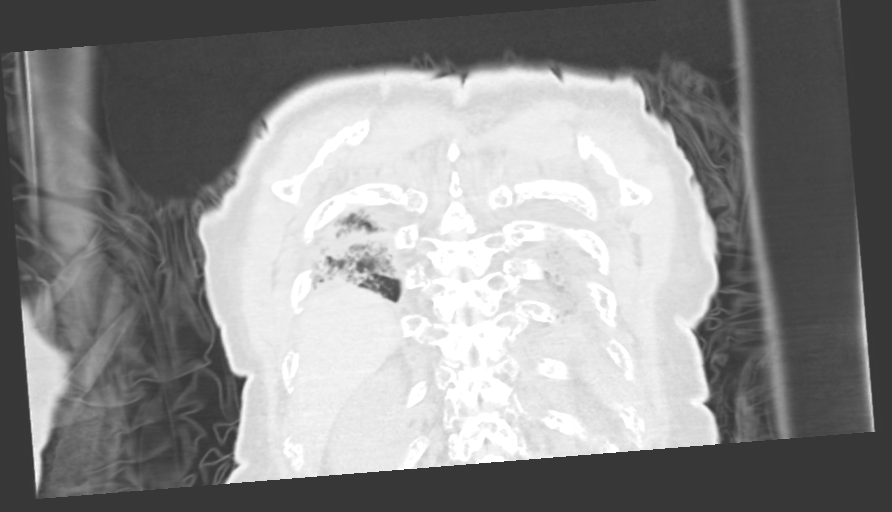
[im 39/97  lung]
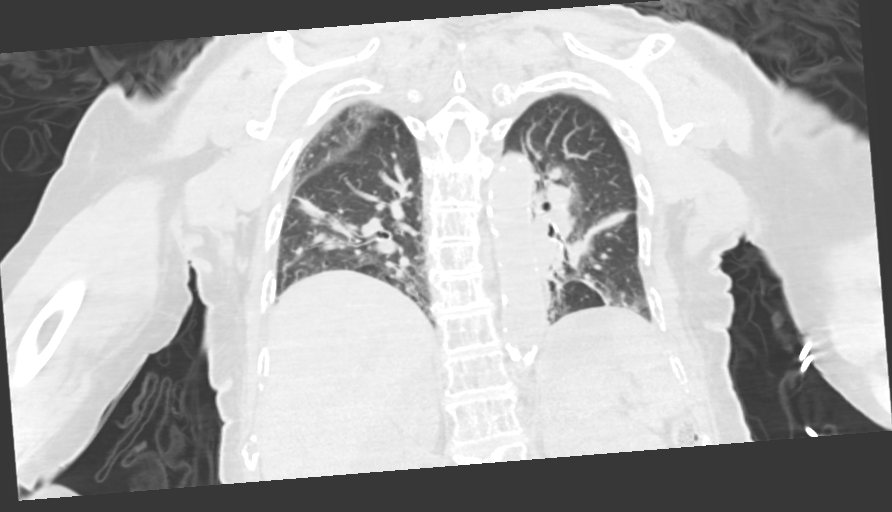
[im 58/97  lung]
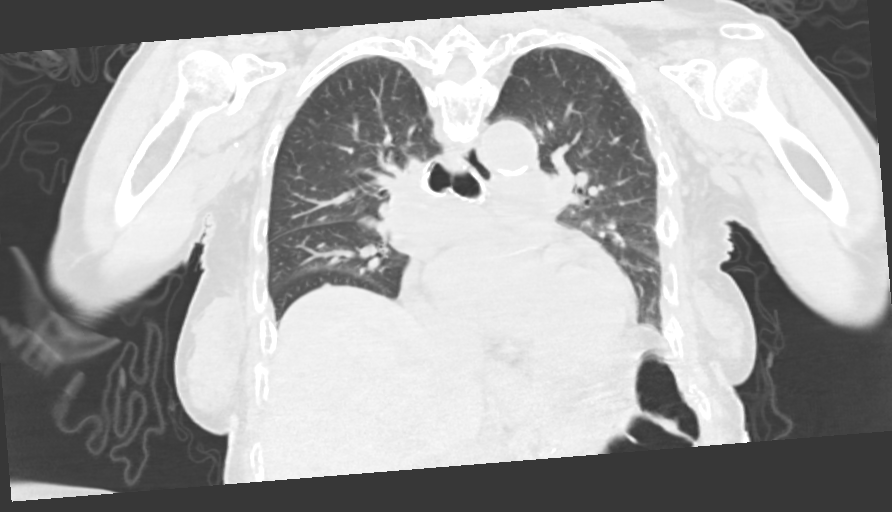

[13 of 36 positions shown; findings below may reference images not displayed]

FINDINGS: Cardiovascular: Mild cardiac enlargement. No pericardial effusion.
Aortic atherosclerosis and coronary artery atherosclerotic
calcifications. Increase caliber of the main pulmonary artery
compatible with PA hypertension.

Mediastinum/Nodes: No enlarged mediastinal or axillary lymph nodes.
Thyroid gland, trachea, and esophagus demonstrate no significant
findings.

Lungs/Pleura: No pleural effusion. No airspace consolidation. Areas
of subsegmental atelectasis noted within both lower lung zones and
lingula. 4 mm perifissural nodule in the right middle lobe is
identified, image 61/3. No suspicious nodule or mass identified.

Upper Abdomen: No acute findings within the imaged portions of the
upper abdomen.

Musculoskeletal: The bones are markedly osteopenic. There are
innumerable cortical lucencies throughout the ribs, thoracic spine
and sternum concerning for metastatic disease., image 76/6. The
thoracic vertebral body heights are well preserved. There are
innumerable bilateral rib fractures. Some of these appear chronic or
subacute in nature where as others are more acute in appearance
IMPRESSION: 1. No evidence for pneumonia.
2. Bilateral areas of lower lung zone subsegmental atelectasis. No
signs of pneumonia.
3. Innumerable cortical lucencies throughout the ribs, thoracic
spine and sternum. Imaging findings are worrisome for diffuse
osseous metastasis, lesions of multiple myeloma or diffuse metabolic
bone disorder.
4. Innumerable bilateral rib fractures. Some of these appear chronic
or subacute in nature and others are more acute in appearance.
5. Increase caliber of the main pulmonary artery compatible with PA
hypertension.
6. Coronary artery atherosclerotic calcifications.
7. 4 mm perifissural nodule in the right middle lobe is nonspecific.
No follow-up needed if patient is low-risk. Non-contrast chest CT
can be considered in 12 months if patient is high-risk. This
recommendation follows the consensus statement: Guidelines for
Management of Incidental Pulmonary Nodules Detected on CT Images:
8. Aortic Atherosclerosis ([8J]-[8J]).

## 2021-05-01 IMAGING — CR DG CHEST 1V PORT
1 series · 1 of 1 positions shown · non-contrast
Comparison: [DATE]

CLINICAL DATA: malaise, poorly responsive

EXAM:
PORTABLE CHEST 1 VIEW

[dg chest port 1 view]
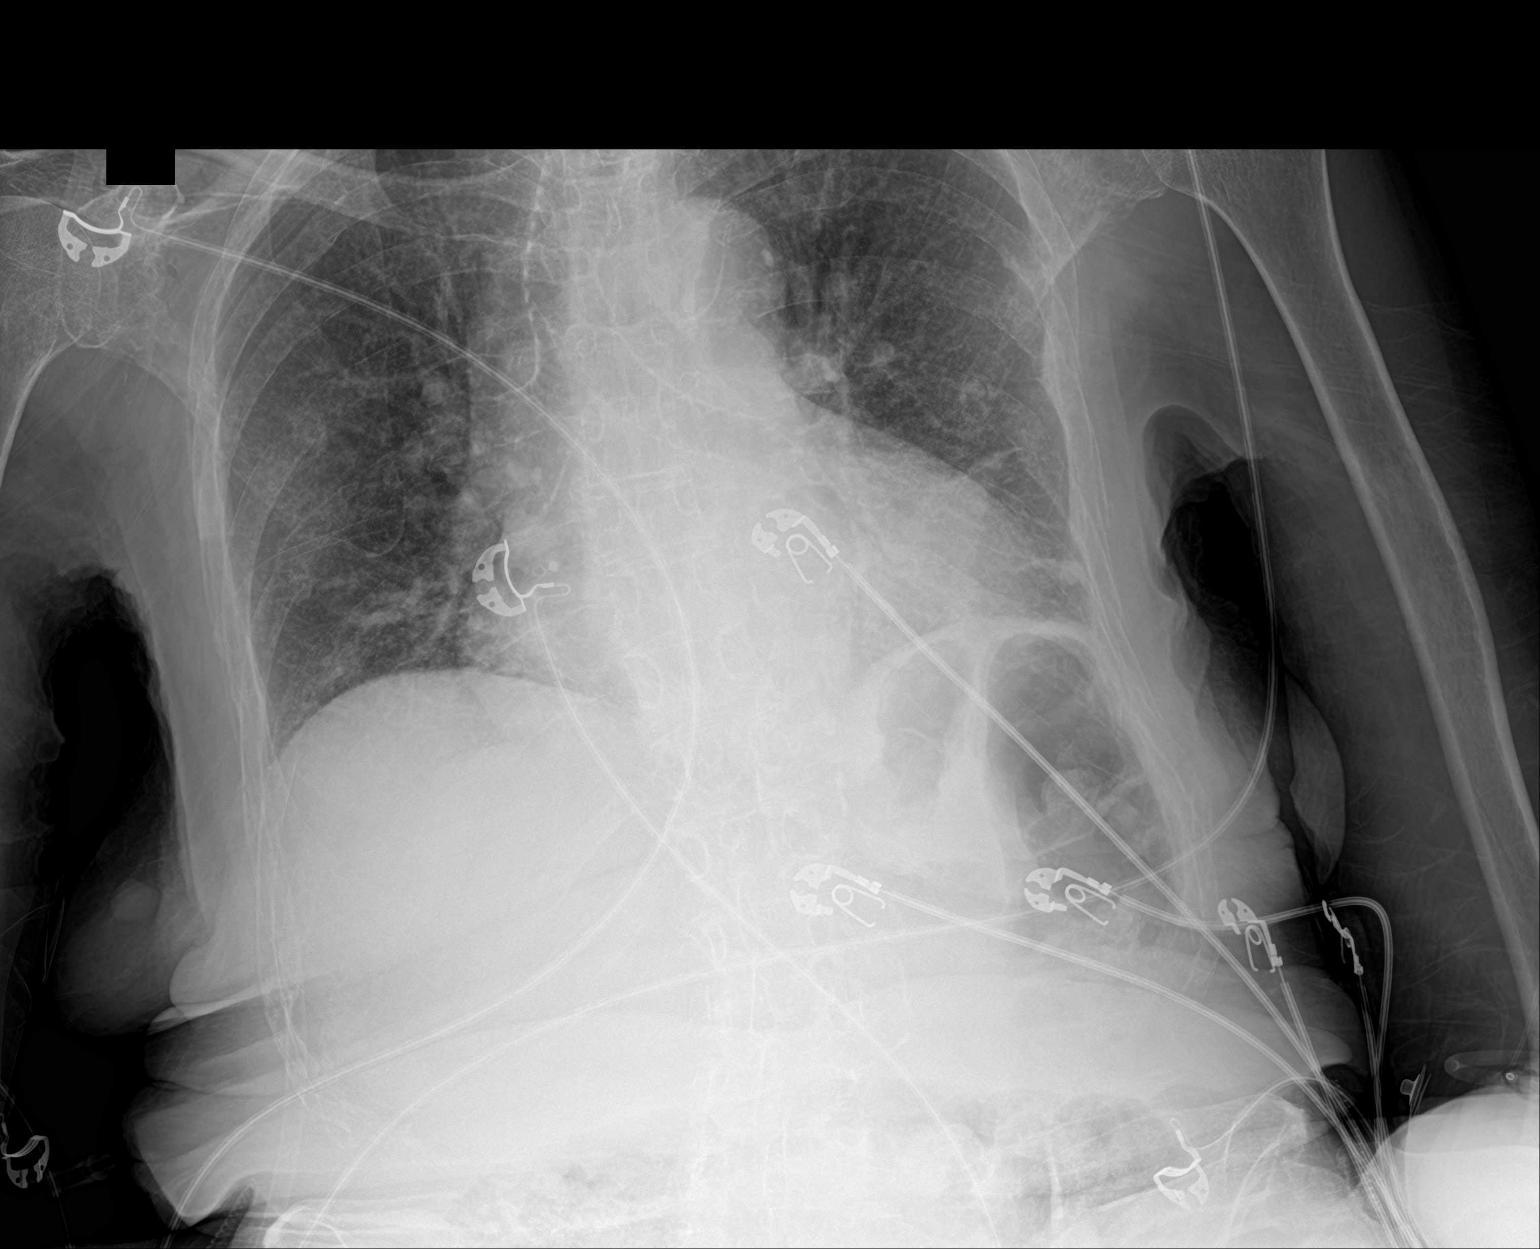

[1 of 1 positions shown; findings below may reference images not displayed]

FINDINGS: Cardiomegaly, vascular congestion. No overt edema. Bibasilar
atelectasis. No effusions or pneumothorax. No acute bony
abnormality.
IMPRESSION: Cardiomegaly, vascular congestion.

## 2021-05-01 IMAGING — CT CT HEAD W/O CM
4 series · 16 of 47 positions shown, 18 images · non-contrast
Comparison: None.

CLINICAL DATA: Mental status change, unknown cause



[Series 2: head wo · axial · 0.42mm/px · z∈[+525,+640]mm · 7 of 31 slices shown, 9 images]
[im 4/31  brain]
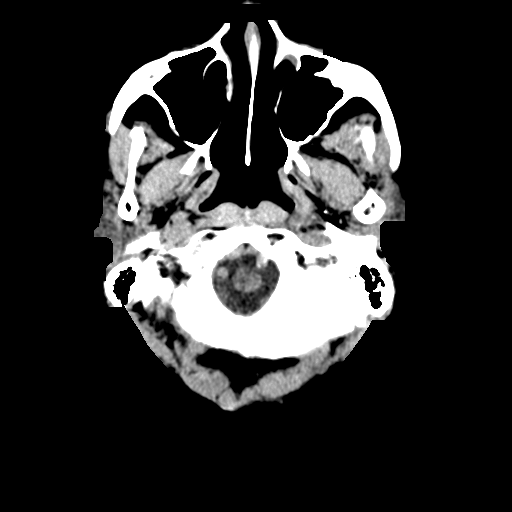
[im 4/31  bone]
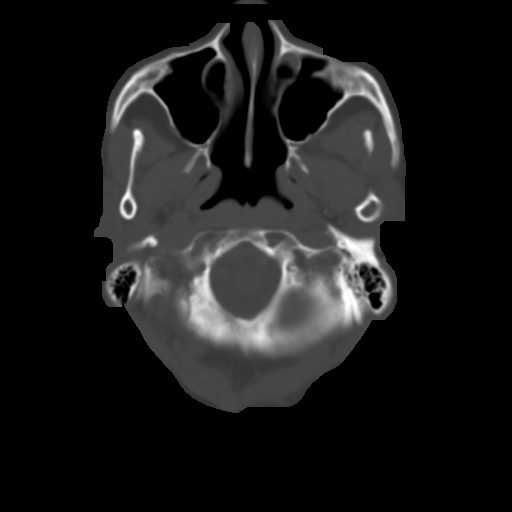
[im 8/31  brain]
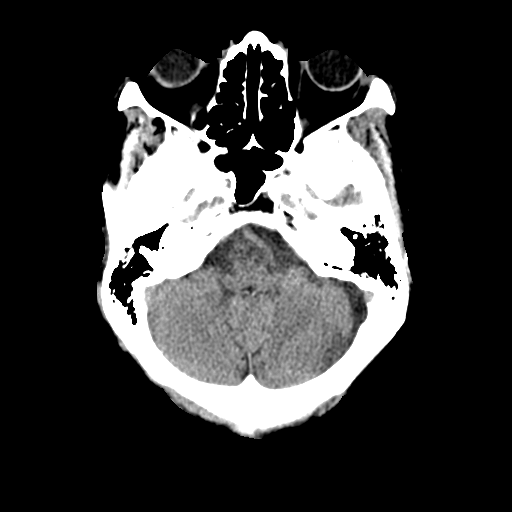
[im 12/31  brain]
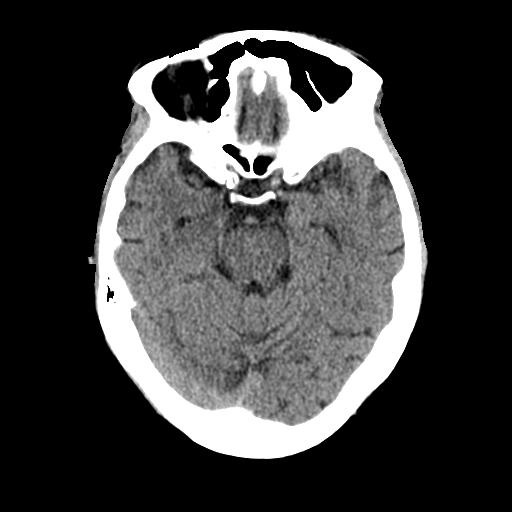
[im 16/31  brain]
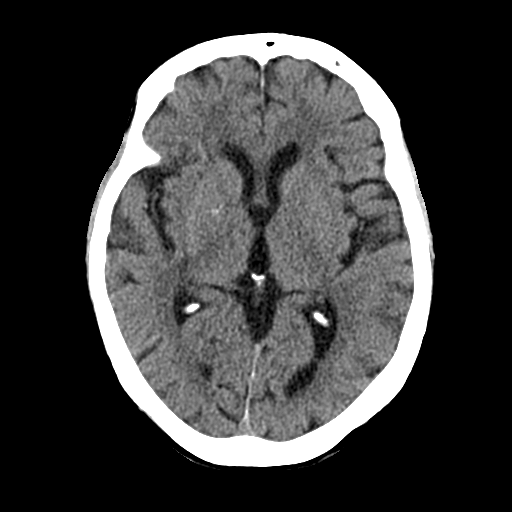
[im 19/31  brain]
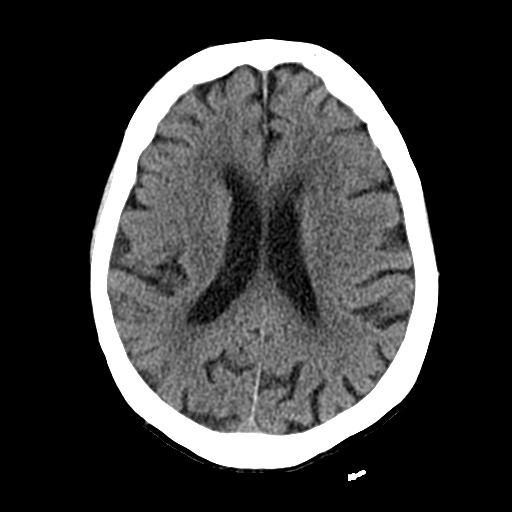
[im 19/31  bone]
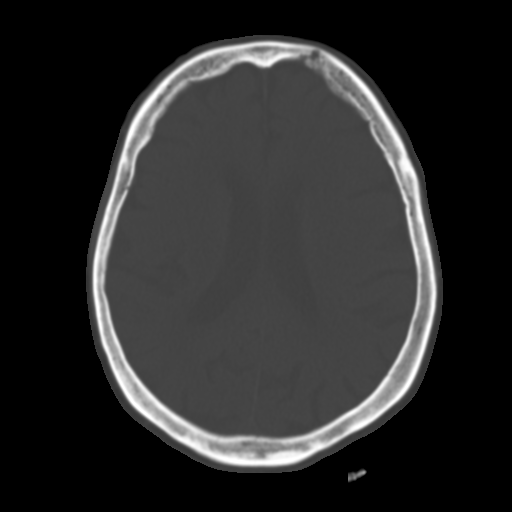
[im 23/31  brain]
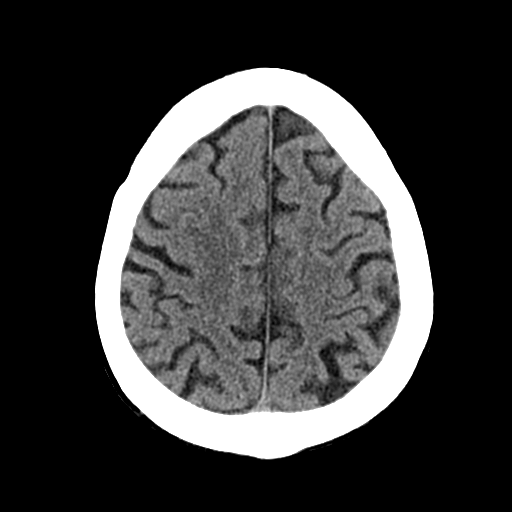
[im 27/31  brain]
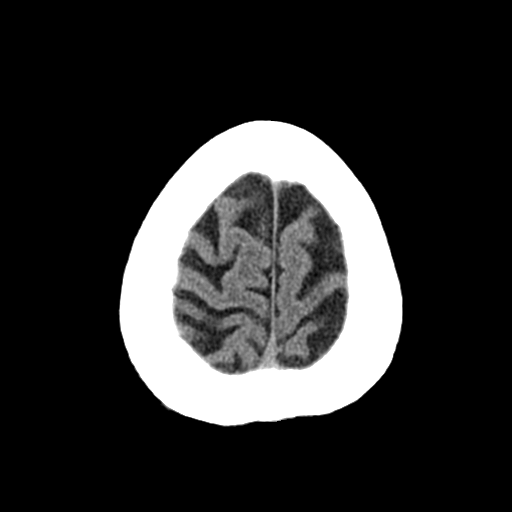

[Series 3: head bone · axial · 0.42mm/px · z∈[+526,+556]mm · 3 of 76 slices shown]
[im 8/76  bone]
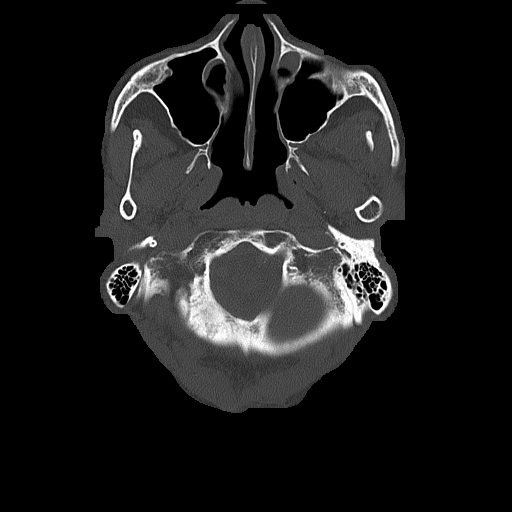
[im 16/76  bone]
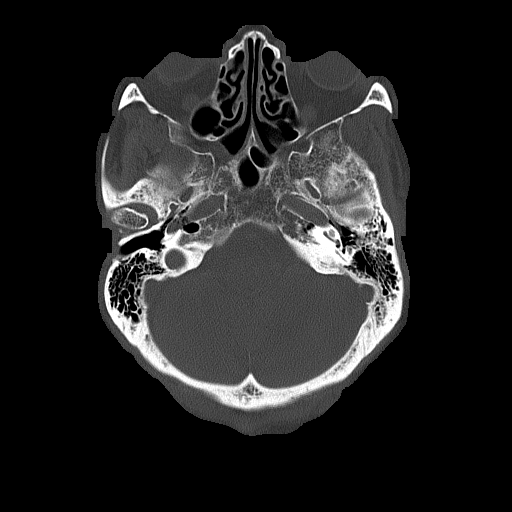
[im 23/76  bone]
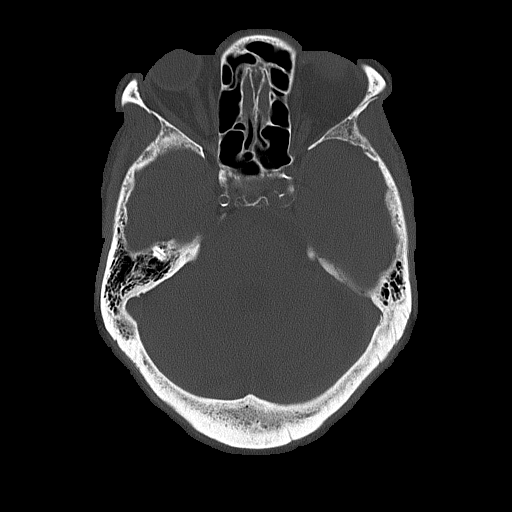

[Series 4: coronal soft tissue · coronal · 0.29mm/px · 3 of 64 slices shown]
[im 22/64  brain]
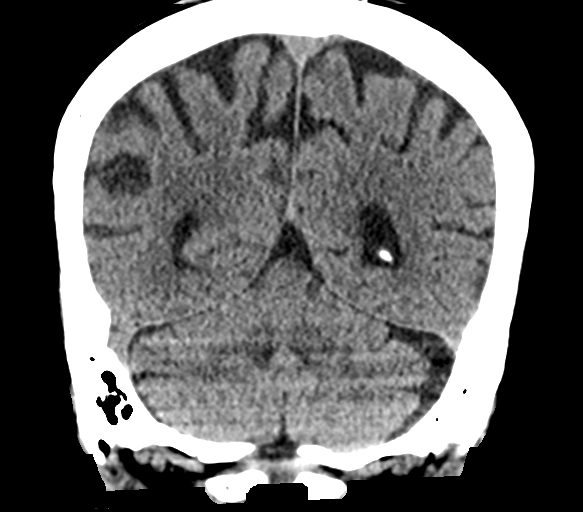
[im 29/64  brain]
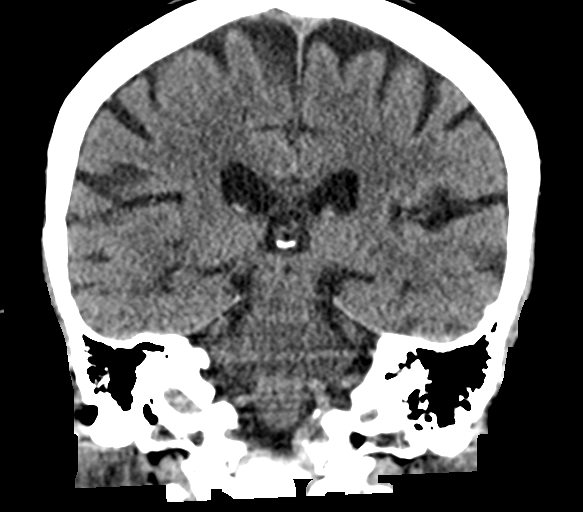
[im 36/64  brain]
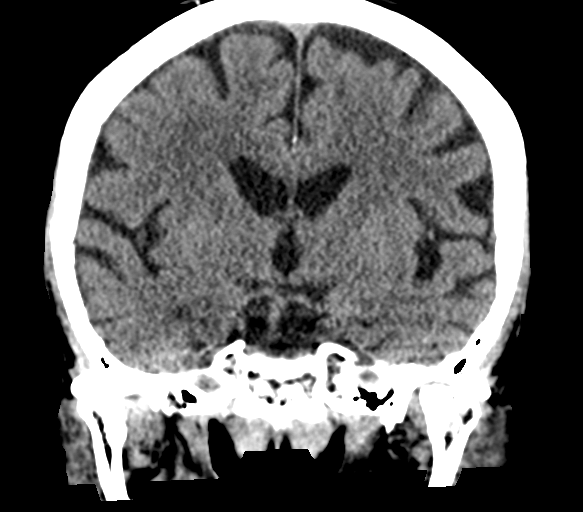

[Series 5: sagittal soft tissue · sagittal · 0.29mm/px · 3 of 54 slices shown]
[im 18/54  brain]
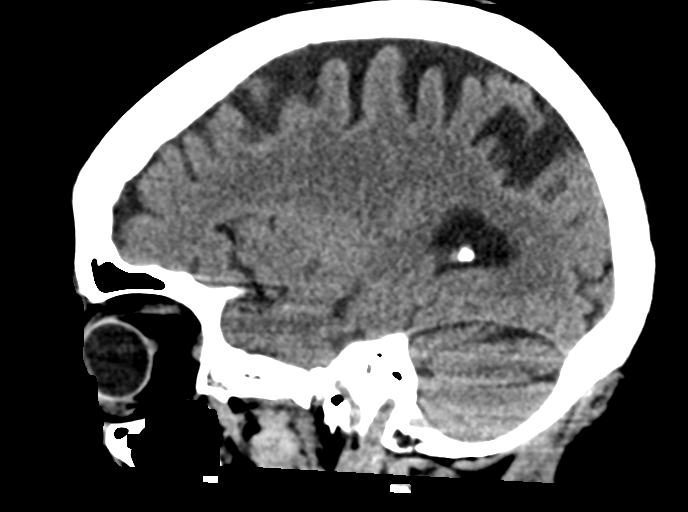
[im 27/54  brain]
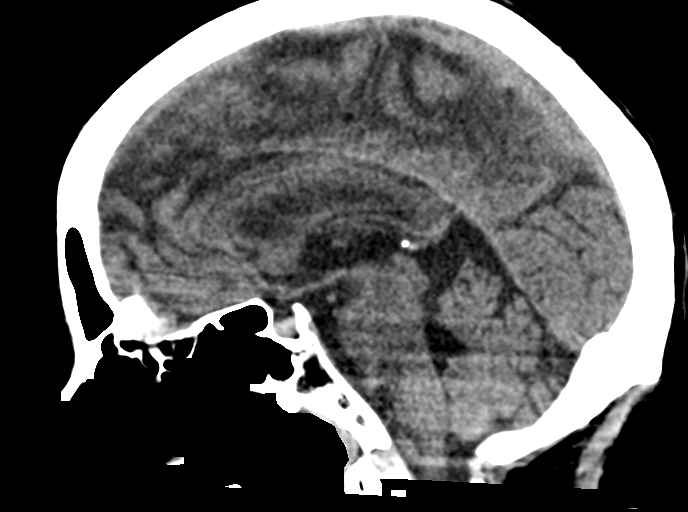
[im 36/54  brain]
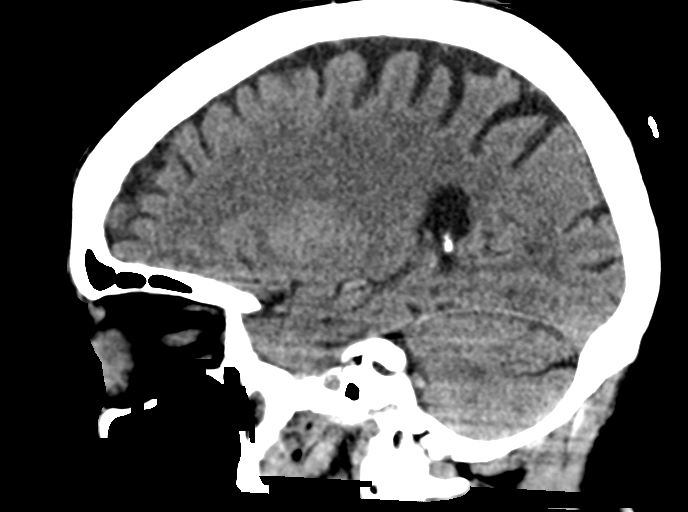

[16 of 47 positions shown; findings below may reference images not displayed]

FINDINGS: Brain: No acute intracranial abnormality. Specifically, no
hemorrhage, hydrocephalus, mass lesion, acute infarction, or
significant intracranial injury.

Vascular: No hyperdense vessel or unexpected calcification.

Skull: No acute calvarial abnormality.

Sinuses/Orbits: No acute findings

Other: None
IMPRESSION: No acute intracranial abnormality.

## 2021-05-01 MED ORDER — ADULT MULTIVITAMIN W/MINERALS CH
1.0000 | ORAL_TABLET | Freq: Every day | ORAL | Status: DC
  Administered 2021-05-02 – 2021-05-07 (×6): 1 via ORAL
  Filled 2021-05-01 (×6): qty 1

## 2021-05-01 MED ORDER — FUROSEMIDE 10 MG/ML IJ SOLN
40.0000 mg | Freq: Every day | INTRAMUSCULAR | Status: DC
  Administered 2021-05-01 – 2021-05-03 (×3): 40 mg via INTRAVENOUS
  Filled 2021-05-01 (×3): qty 4

## 2021-05-01 MED ORDER — PANTOPRAZOLE SODIUM 40 MG PO TBEC
40.0000 mg | DELAYED_RELEASE_TABLET | Freq: Every day | ORAL | Status: DC
  Administered 2021-05-02 – 2021-05-07 (×6): 40 mg via ORAL
  Filled 2021-05-01 (×6): qty 1

## 2021-05-01 MED ORDER — PRAVASTATIN SODIUM 20 MG PO TABS
40.0000 mg | ORAL_TABLET | Freq: Every day | ORAL | Status: DC
  Administered 2021-05-02 – 2021-05-06 (×5): 40 mg via ORAL
  Filled 2021-05-01 (×5): qty 2

## 2021-05-01 MED ORDER — LISINOPRIL 10 MG PO TABS: 10.0000 mg | ORAL_TABLET | Freq: Every day | ORAL | Status: DC

## 2021-05-01 MED ORDER — ACETAMINOPHEN 325 MG PO TABS
650.0000 mg | ORAL_TABLET | Freq: Four times a day (QID) | ORAL | Status: DC | PRN
  Administered 2021-05-04 – 2021-05-07 (×4): 650 mg via ORAL
  Filled 2021-05-01 (×4): qty 2

## 2021-05-01 MED ORDER — ENOXAPARIN SODIUM 40 MG/0.4ML IJ SOSY
40.0000 mg | PREFILLED_SYRINGE | INTRAMUSCULAR | Status: DC
  Administered 2021-05-02 – 2021-05-07 (×5): 40 mg via SUBCUTANEOUS
  Filled 2021-05-01 (×6): qty 0.4

## 2021-05-01 MED ORDER — SODIUM CHLORIDE 0.9 % IV SOLN: INTRAVENOUS | Status: DC

## 2021-05-01 MED ORDER — HYDRALAZINE HCL 20 MG/ML IJ SOLN
10.0000 mg | Freq: Four times a day (QID) | INTRAMUSCULAR | Status: DC | PRN
Start: 2021-05-01 — End: 2021-05-07
  Administered 2021-05-01 – 2021-05-02 (×2): 10 mg via INTRAVENOUS
  Filled 2021-05-01 (×2): qty 1

## 2021-05-01 MED ORDER — LACTATED RINGERS IV BOLUS
1000.0000 mL | Freq: Once | INTRAVENOUS | Status: AC
  Administered 2021-05-01: 1000 mL via INTRAVENOUS

## 2021-05-01 MED ORDER — ASPIRIN 81 MG PO CHEW
81.0000 mg | CHEWABLE_TABLET | Freq: Every day | ORAL | Status: DC
  Administered 2021-05-02 – 2021-05-07 (×5): 81 mg via ORAL
  Filled 2021-05-01 (×6): qty 1

## 2021-05-01 MED ORDER — OYSTER SHELL CALCIUM/D3 500-5 MG-MCG PO TABS: 2.0000 | ORAL_TABLET | Freq: Every day | ORAL | Status: DC

## 2021-05-01 MED ORDER — ONDANSETRON HCL 4 MG/2ML IJ SOLN: 4.0000 mg | Freq: Four times a day (QID) | INTRAMUSCULAR | Status: DC | PRN

## 2021-05-01 MED ORDER — CALCITONIN (SALMON) 200 UNIT/ML IJ SOLN
4.0000 [IU]/kg | Freq: Two times a day (BID) | INTRAMUSCULAR | Status: DC
  Administered 2021-05-02 (×3): 220 [IU] via INTRAMUSCULAR
  Filled 2021-05-01 (×7): qty 1.1

## 2021-05-01 MED ORDER — ZOLEDRONIC ACID 4 MG/5ML IV CONC
4.0000 mg | Freq: Once | INTRAVENOUS | Status: AC
  Administered 2021-05-01: 4 mg via INTRAVENOUS
  Filled 2021-05-01: qty 5

## 2021-05-01 MED ORDER — ONDANSETRON HCL 4 MG/2ML IJ SOLN
4.0000 mg | Freq: Once | INTRAMUSCULAR | Status: AC
  Administered 2021-05-01: 4 mg via INTRAVENOUS
  Filled 2021-05-01: qty 2

## 2021-05-01 MED ORDER — SERTRALINE HCL 50 MG PO TABS
25.0000 mg | ORAL_TABLET | Freq: Every day | ORAL | Status: DC
  Administered 2021-05-02 – 2021-05-07 (×6): 25 mg via ORAL
  Filled 2021-05-01 (×6): qty 1

## 2021-05-01 MED ORDER — ONDANSETRON HCL 4 MG PO TABS: 4.0000 mg | ORAL_TABLET | Freq: Four times a day (QID) | ORAL | Status: DC | PRN

## 2021-05-01 MED ORDER — OMEGA-3-ACID ETHYL ESTERS 1 G PO CAPS
1.0000 | ORAL_CAPSULE | Freq: Every day | ORAL | Status: DC
  Administered 2021-05-02 – 2021-05-07 (×6): 1 g via ORAL
  Filled 2021-05-01 (×6): qty 1

## 2021-05-01 MED ORDER — INSULIN ASPART 100 UNIT/ML IJ SOLN
0.0000 [IU] | INTRAMUSCULAR | Status: DC
  Administered 2021-05-01: 3 [IU] via SUBCUTANEOUS
  Administered 2021-05-01: 5 [IU] via SUBCUTANEOUS
  Administered 2021-05-02: 2 [IU] via SUBCUTANEOUS
  Administered 2021-05-02: 5 [IU] via SUBCUTANEOUS
  Administered 2021-05-02: 8 [IU] via SUBCUTANEOUS
  Administered 2021-05-02: 11 [IU] via SUBCUTANEOUS
  Administered 2021-05-03 (×2): 3 [IU] via SUBCUTANEOUS
  Administered 2021-05-03: 8 [IU] via SUBCUTANEOUS
  Administered 2021-05-03: 3 [IU] via SUBCUTANEOUS
  Administered 2021-05-03: 5 [IU] via SUBCUTANEOUS
  Administered 2021-05-03 – 2021-05-04 (×2): 3 [IU] via SUBCUTANEOUS
  Administered 2021-05-04: 5 [IU] via SUBCUTANEOUS
  Administered 2021-05-04 – 2021-05-05 (×4): 3 [IU] via SUBCUTANEOUS
  Administered 2021-05-05: 2 [IU] via SUBCUTANEOUS
  Administered 2021-05-05: 11 [IU] via SUBCUTANEOUS
  Administered 2021-05-06 (×3): 3 [IU] via SUBCUTANEOUS
  Administered 2021-05-06 – 2021-05-07 (×4): 2 [IU] via SUBCUTANEOUS
  Administered 2021-05-07: 3 [IU] via SUBCUTANEOUS
  Filled 2021-05-01 (×28): qty 1

## 2021-05-01 NOTE — ED Notes (Signed)
Linen/diaper changed and repositioned in bed with tech.

## 2021-05-01 NOTE — ED Triage Notes (Signed)
Pt to ED by EMS from home with c/o increasing weakness and decreased PO intake over the past couple of weeks. EMS reports pt did have an episode of N+V this morning. Arrives extremely lethargic, BP elevated, all other VSS.

## 2021-05-01 NOTE — Consult Note (Signed)
[facility] NOTE  Patient Care Team: Juline Patch, MD as PCP - General (Family Medicine) Vladimir Faster, [facility] (Inactive) (Pharmacist)  CHIEF COMPLAINTS/PURPOSE OF CONSULTATION: Multiple lytic lesions in the bone/hypercalcemia  HISTORY OF PRESENTING ILLNESS:  Cristina Morgan 87 y.o.  female with a history of diabetes hypertension-is currently admitted to hospital for mental status changes/generalized weakness/poor appetite weight loss.  Patient of note has been fairly active for age-noted to have multiple thoracic/lumbar fractures in the last 6 months or so-however she has been evaluated by Dr. Lacinda Axon neurosurgery.  She is currently being treated conservatively with brace.  However, progressively patient noted to have worsening appetite/lethargy-in general on the day of admission noted to have difficulty getting up and moving around which led to evaluation emergency room.  In the emergency room labs remarkable for hemoglobin 10.5.  White count/platelets normal.  Calcium 11.4.  Normal albumin.  CT scan chest -showed no evidence of pneumonia.  However showed innumerable cortical lucencies throughout the ribs thoracic spine and sternum/multiple rib fractures-concerning for myeloma-like process.   Patient received IV fluids in the emergency room.  As per the family bedside patient is slightly less confused.  However not anywhere close to baseline.  Review of Systems  Unable to perform ROS: Mental status change    MEDICAL HISTORY:  Past Medical History:  Diagnosis Date   Diabetes mellitus without complication ([place])    Hyperlipidemia    Hypertension     SURGICAL HISTORY: Past Surgical History:  Procedure Laterality Date   CATARACT EXTRACTION Bilateral 2014   COLONOSCOPY  2011   cleared for 5 yrs- [facility]   ECTOPIC PREGNANCY SURGERY      SOCIAL HISTORY: Social History   Socioeconomic History   Marital status: Married    Spouse name: Not on file   Number of  children: 2   Years of education: Not on file   Highest education level: 12th grade  Occupational History   Occupation: Retired  Tobacco Use   Smoking status: Never   Smokeless tobacco: Never   Tobacco comments:    smoking cessation materials not required  Vaping Use   Vaping Use: Never used  Substance and Sexual Activity   Alcohol use: No    Alcohol/week: 0.0 standard drinks   Drug use: No   Sexual activity: Never  Other Topics Concern   Not on file  Social History Narrative   [place] with husband; drives. Never smoked; no alcohol. Daughters live close.    Social Determinants of Health   Financial Resource Strain: Low Risk    Difficulty of Paying Living Expenses: Not hard at all  Food Insecurity: No Food Insecurity   Worried About Charity fundraiser in the Last Year: Never true   [place] in the Last Year: Never true  Transportation Needs: No Transportation Needs   Lack of Transportation (Medical): No   Lack of Transportation (Non-Medical): No  Physical Activity: Inactive   Days of Exercise per Week: 0 days   Minutes of Exercise per Session: 0 min  Stress: No Stress Concern Present   Feeling of Stress : Not at all  Social Connections: Moderately Integrated   Frequency of Communication with Friends and Family: More than three times a week   Frequency of Social Gatherings with Friends and Family: More than three times a week   Attends Religious Services: More than 4 times per year   Active Member of [employer] or Organizations:  No   Attends Archivist Meetings: Never   Marital Status: Married  Human resources officer Violence: Not At Risk   Fear of Current or Ex-Partner: No   Emotionally Abused: No   Physically Abused: No   Sexually Abused: No    FAMILY HISTORY: Family History  Problem Relation Age of Onset   Cancer Mother        breast   Heart disease Mother    Diabetes Father    Heart disease Father    Heart attack Father    Heart attack  Brother    Diabetes Brother     ALLERGIES:  is allergic to latex and penicillins.  MEDICATIONS:  Current Facility-Administered Medications  Medication Dose Route Frequency Provider Last Rate Last Admin   0.9 %  sodium chloride infusion   Intravenous Continuous Agbata, Tochukwu, MD 125 mL/hr at 05/01/21 1029 New Bag at 05/01/21 1029   acetaminophen (TYLENOL) tablet 650 mg  650 mg Oral Q6H PRN Agbata, Tochukwu, MD       aspirin chewable tablet 81 mg  81 mg Oral Daily Agbata, Tochukwu, MD       calcitonin (MIACALCIN) injection 220 Units  4 Units/kg Intramuscular BID Demond Shallenberger, Elisha Headland, MD       calcium-vitamin D (OSCAL WITH D) 500-5 MG-MCG per tablet 2 tablet  2 tablet Oral Daily Agbata, Tochukwu, MD       enoxaparin (LOVENOX) injection 40 mg  40 mg Subcutaneous Q24H Agbata, Tochukwu, MD       furosemide (LASIX) injection 40 mg  40 mg Intravenous Daily Agbata, Tochukwu, MD   40 mg at 05/01/21 1029   hydrALAZINE (APRESOLINE) injection 10 mg  10 mg Intravenous Q6H PRN Agbata, Tochukwu, MD   10 mg at 05/01/21 1310   insulin aspart (novoLOG) injection 0-15 Units  0-15 Units Subcutaneous Q4H Agbata, Tochukwu, MD   5 Units at 05/01/21 1230   lisinopril (ZESTRIL) tablet 10 mg  10 mg Oral Daily Agbata, Tochukwu, MD       multivitamin with minerals tablet 1 tablet  1 tablet Oral Daily Agbata, Tochukwu, MD       omega-3 acid ethyl esters (LOVAZA) capsule 1 g  1 capsule Oral Daily Agbata, Tochukwu, MD       ondansetron (ZOFRAN) tablet 4 mg  4 mg Oral Q6H PRN Agbata, Tochukwu, MD       Or   ondansetron (ZOFRAN) injection 4 mg  4 mg Intravenous Q6H PRN Agbata, Tochukwu, MD       pantoprazole (PROTONIX) EC tablet 40 mg  40 mg Oral Daily Agbata, Tochukwu, MD       pravastatin (PRAVACHOL) tablet 40 mg  40 mg Oral q1800 Agbata, Tochukwu, MD       sertraline (ZOLOFT) tablet 25 mg  25 mg Oral Daily Agbata, Tochukwu, MD        Patient resting comfortably.  Drowsy easily arousable.  Oriented x0-1. PHYSICAL  EXAMINATION:  Vitals:   05/01/21 1438 05/01/21 1440  BP: (!) 169/73   Pulse: 100   Resp: 18   Temp: 97.8 F (36.6 C)   SpO2:  97%   Filed Weights   05/01/21 0305  Weight: 121 lb 4.1 oz (55 kg)    Physical Exam Vitals and nursing note reviewed.  HENT:     Head: Normocephalic and atraumatic.     Mouth/Throat:     Pharynx: Oropharynx is clear.  Eyes:     Extraocular Movements: Extraocular movements intact.  Pupils: Pupils are equal, round, and reactive to light.  Cardiovascular:     Rate and Rhythm: Normal rate and regular rhythm.  Pulmonary:     Comments: Decreased breath sounds bilaterally.  Abdominal:     Palpations: Abdomen is soft.  Musculoskeletal:        General: Normal range of motion.     Cervical back: Normal range of motion.  Skin:    General: Skin is warm.  Neurological:     General: No focal deficit present.     Mental Status: She is alert and oriented to person, place, and time.     LABORATORY DATA:  I have reviewed the data as listed Lab Results  Component Value Date   WBC 6.7 05/01/2021   HGB 10.5 (L) 05/01/2021   HCT 33.3 (L) 05/01/2021   MCV 99.1 05/01/2021   PLT 314 05/01/2021   Recent Labs    07/24/20 1104 02/01/21 1502 02/18/21 1615 04/15/21 1549 05/01/21 0320  NA 139 138  --   --  135  K 4.1 4.3  --   --  3.7  CL 102 101  --   --  105  CO2 22 23  --   --  24  GLUCOSE 105* 178*  --   --  253*  BUN 15 19  --   --  24*  CREATININE 0.74 0.84  --   --  0.85  CALCIUM 9.9 10.7* 11.1*  --  11.4*  GFRNONAA  --   --   --   --  >60  PROT 7.6  --   --   --  7.8  ALBUMIN 4.4 4.3  --  4.4 3.8  AST 19 16  --  17 18  ALT 14 9  --  8 12  ALKPHOS 68 102  --  108 88  BILITOT 0.5 0.3  --  0.3 0.7  BILIDIR  --  0.12  --  0.10  --     RADIOGRAPHIC STUDIES: I have personally reviewed the radiological images as listed and agreed with the findings in the report. CT HEAD WO CONTRAST (5MM)  Result Date: 05/01/2021 CLINICAL DATA:  Mental  status change, unknown cause EXAM: CT HEAD WITHOUT CONTRAST TECHNIQUE: Contiguous axial images were obtained from the base of the skull through the vertex without intravenous contrast. RADIATION DOSE REDUCTION: This exam was performed according to the departmental dose-optimization program which includes automated exposure control, adjustment of the mA and/or kV according to patient size and/or use of iterative reconstruction technique. COMPARISON:  None. FINDINGS: Brain: No acute intracranial abnormality. Specifically, no hemorrhage, hydrocephalus, mass lesion, acute infarction, or significant intracranial injury. Vascular: No hyperdense vessel or unexpected calcification. Skull: No acute calvarial abnormality. Sinuses/Orbits: No acute findings Other: None IMPRESSION: No acute intracranial abnormality. Electronically Signed   By: Rolm Baptise M.D.   On: 05/01/2021 03:39   CT Chest Wo Contrast  Result Date: 05/01/2021 CLINICAL DATA:  Evaluate for pneumonia. EXAM: CT CHEST WITHOUT CONTRAST TECHNIQUE: Multidetector CT imaging of the chest was performed following the standard protocol without IV contrast. RADIATION DOSE REDUCTION: This exam was performed according to the departmental dose-optimization program which includes automated exposure control, adjustment of the mA and/or kV according to patient size and/or use of iterative reconstruction technique. COMPARISON:  None. FINDINGS: Cardiovascular: Mild cardiac enlargement. No pericardial effusion. Aortic atherosclerosis and coronary artery atherosclerotic calcifications. Increase caliber of the main pulmonary artery compatible with PA hypertension. Mediastinum/Nodes: No enlarged mediastinal or  axillary lymph nodes. Thyroid gland, trachea, and esophagus demonstrate no significant findings. Lungs/Pleura: No pleural effusion. No airspace consolidation. Areas of subsegmental atelectasis noted within both lower lung zones and lingula. 4 mm perifissural nodule in the  right middle lobe is identified, image 61/3. No suspicious nodule or mass identified. Upper Abdomen: No acute findings within the imaged portions of the upper abdomen. Musculoskeletal: The bones are markedly osteopenic. There are innumerable cortical lucencies throughout the ribs, thoracic spine and sternum concerning for metastatic disease., image 76/6. The thoracic vertebral body heights are well preserved. There are innumerable bilateral rib fractures. Some of these appear chronic or subacute in nature where as others are more acute in appearance IMPRESSION: 1. No evidence for pneumonia. 2. Bilateral areas of lower lung zone subsegmental atelectasis. No signs of pneumonia. 3. Innumerable cortical lucencies throughout the ribs, thoracic spine and sternum. Imaging findings are worrisome for diffuse osseous metastasis, lesions of multiple myeloma or diffuse metabolic bone disorder. 4. Innumerable bilateral rib fractures. Some of these appear chronic or subacute in nature and others are more acute in appearance. 5. Increase caliber of the main pulmonary artery compatible with PA hypertension. 6. Coronary artery atherosclerotic calcifications. 7. 4 mm perifissural nodule in the right middle lobe is nonspecific. No follow-up needed if patient is low-risk. Non-contrast chest CT can be considered in 12 months if patient is high-risk. This recommendation follows the consensus statement: Guidelines for Management of Incidental Pulmonary Nodules Detected on CT Images: From the Fleischner Society 2017; Radiology 2017; 284:228-243. 8. Aortic Atherosclerosis (ICD10-I70.0). Electronically Signed   By: Kerby Moors M.D.   On: 05/01/2021 06:05   MR Pelvis W Wo Contrast  Result Date: 04/26/2021 CLINICAL DATA:  Cystic left adnexal lesion on recent ultrasound. Fibroids. Weight loss. EXAM: MRI PELVIS WITHOUT AND WITH CONTRAST TECHNIQUE: Multiplanar multisequence MR imaging of the pelvis was performed both before and after  administration of intravenous contrast. CONTRAST:  81m GADAVIST GADOBUTROL 1 MMOL/ML IV SOLN COMPARISON:  Ultrasound on 02/21/2021 FINDINGS: Lower Urinary Tract: Incompletely distended urinary bladder, but otherwise unremarkable. Bowel: Unremarkable pelvic bowel loops. Vascular/Lymphatic: Unremarkable. No pathologically enlarged pelvic lymph nodes identified. Reproductive: -- Uterus: Measures 4.5 x 2.8 by 3.9 cm (volume = 26 cm^3). A few small uterine fibroids are seen, largest measuring 1.4 cm in maximum diameter. No abnormal endometrial thickening seen. Cervix and vagina are unremarkable. -- Right ovary: A 1.5 cm simple cyst is seen in the right ovary. No other cystic or solid masses are identified. -- Left ovary: A complex cystic lesion is seen in the left ovary which measures 3.6 x 3.0 by 2.3 cm. This contains several thin internal septations, and a 8 mm solid mural nodule with probable mild contrast enhancement. This is consistent with a cystic ovarian neoplasm. Other: No peritoneal thickening or abnormal free fluid. A loculated fluid collection is noted within the left inguinal canal which measures 3.6 x 1.6 cm, consistent with a hydrocele of the canal of Nuck. Musculoskeletal:  Unremarkable. IMPRESSION: 3.6 cm complex cystic lesion in the left ovary, with thin septations and small solid mural nodule, consistent with cystic ovarian neoplasm. Surgical evaluation should be considered. 1.5 cm benign-appearing cyst in the right ovary. A few small uterine fibroids, largest measuring 1.4 cm. Incidentally noted small left-sided hydrocele of the canal of Nuck. Electronically Signed   By: JMarlaine HindM.D.   On: 04/26/2021 18:44   DG Bone Density  Result Date: 04/08/2021 EXAM: DUAL X-RAY ABSORPTIOMETRY (DXA) FOR BONE MINERAL DENSITY IMPRESSION:  crr Your patient Zeta Bucy completed a BMD test on 04/08/2021 using the [employer] (analysis version: 14.10) manufactured by [employer]. The  following summarizes the results of our evaluation. PATIENT BIOGRAPHICAL: Name: Zhane, Donlan Patient ID: [id number] Birth Date: [birth year] Height: 62.5 in. Gender: Female Exam Date: 04/08/2021 Weight: 132.8 lbs. Indications: Advanced Age, arthritis, diabetic, Early Menopause, Height Loss, History of Fracture (Adult), Low Body Weight, POSTmenopausal Fractures: Humerus, Spine Treatments: 81 MG ASPIRIN, calcium /vit d, glipizide, metformin, multivitamin, Vitamin D ASSESSMENT: The BMD measured at Femur Neck Left is 0.603 g/cm2 with a T-score of -3.1. This patient is considered osteoporotic according to [facility]) criteria. Lumbar spine was not utilized due to advanced degenerative changes/scoliosis, etc.The scan quality is limited by exclusion of L-spine. Site Region Measured Measured WHO Young Adult BMD Date       Age      Classification T-score DualFemur Neck Left 04/08/2023 85.0 Osteoporosis -3.1 0.603 g/cm2 DualFemur Neck Left 09/24/2017 81.4 Osteopenia -1.5 0.829 g/cm2 DualFemur Total Mean 04/08/2021 85.0 Osteoporosis -2.9 0.643 g/cm2 DualFemur Total Mean 09/24/2017 81.4 Osteopenia -1.2 0.857 g/cm2 Left Forearm Radius 33% 04/08/2021 85.0 Osteopenia -1.8 0.723 g/cm2 Left Forearm Radius 33% 09/24/2017 81.4 Osteopenia -1.2 0.771 g/cm2 World Health Organization [facility]) criteria for post-menopausal, Caucasian Women: Normal:       T-score at or above -1 SD Osteopenia:   T-score between -1 and -2.5 SD Osteoporosis: T-score at or below -2.5 SD RECOMMENDATIONS: 1. All patients should optimize calcium and vitamin D intake. 2. Consider FDA-approved medical therapies in postmenopausal women and men aged 69 years and older, based on the following: a. A hip or vertebral (clinical or morphometric) fracture b. T-score < -2.5 at the femoral neck or spine after appropriate evaluation to exclude secondary causes c. Low bone mass (T-score between -1.0 and -2.5 at the femoral neck or spine) and a 10-year  probability of a hip fracture > 3% or a 10-year probability of a major osteoporosis-related fracture > 20% based on the [place]-adapted WHO algorithm d. Clinician judgment and/or patient preferences may indicate treatment for people with 10-year fracture probabilities above or below these levels FOLLOW-UP: People with diagnosed cases of osteoporosis or at high risk for fracture should have regular bone mineral density tests. For patients eligible for Medicare, routine testing is allowed once every 2 years. The testing frequency can be increased to one year for patients who have rapidly progressing disease, those who are receiving or discontinuing medical therapy to restore bone mass, or have additional risk factors. I have reviewed this report, and agree with the above findings. [facility] Radiology Electronically Signed   By: Elmer Picker M.D.   On: 04/08/2021 16:32   DG Chest Portable 1 View  Result Date: 05/01/2021 CLINICAL DATA:  malaise, poorly responsive EXAM: PORTABLE CHEST 1 VIEW COMPARISON:  02/18/2021 FINDINGS: Cardiomegaly, vascular congestion. No overt edema. Bibasilar atelectasis. No effusions or pneumothorax. No acute bony abnormality. IMPRESSION: Cardiomegaly, vascular congestion. Electronically Signed   By: Rolm Baptise M.D.   On: 05/01/2021 03:47    Hypercalcemia #87 year old female patient with no prior history of malignancy-is currently admitted to hospital for mental status changes/hypercalcemia calcium 11.1; incidentally on CT scan.  To have multiple lytic lesions in the bone.   #Hypercalcemia-calcium 11 point; multiple bone lesions-lytic in the bone; mild anemia; however normal renal function-concerning for underlying malignancy like multiple myeloma versus others.   #Multiple thoracic fractures [followed by Dr.Cook at [place]  #Mental status changes  likely secondary to hypercalcemia; CT head noncontrast negative.  Recommendations/plan:  #Recommend Zometa infusion 4 mg for  hypercalcemia.  Also start calcitonin twice daily.   #Check multiple myeloma panel; kappa lambda light chain ratio.  Check parathyroid hormone levels.  # Thank you Dr.Agbata for allowing me to participate in the care of your pleasant patient. Please do not hesitate to contact me with questions or concerns in the interim.  The above plan of care was discussed with the patient's 2 daughters by the bedside in detail.  # I reviewed the blood work- with the patient in detail; also reviewed the imaging independently [as summarized above]; and with the patient in detail.    All questions were answered. The patient's family knows to call the clinic with any problems, questions or concerns.    Cammie Sickle, MD 05/01/2021 5:07 PM

## 2021-05-01 NOTE — Assessment & Plan Note (Addendum)
#  87 year old female patient with no prior history of malignancy-is currently admitted to hospital for mental status changes/hypercalcemia calcium 11.1; incidentally on CT scan.  To have multiple lytic lesions in the bone.   #Hypercalcemia-calcium 11 point; multiple bone lesions-lytic in the bone; mild anemia; however normal renal function-concerning for underlying malignancy like multiple myeloma versus others.   #Multiple thoracic fractures [followed by Dr.Cook at [place]  #Mental status changes likely secondary to hypercalcemia; CT head noncontrast negative.  Recommendations/plan:  #Recommend Zometa infusion 4 mg for hypercalcemia.  Also start calcitonin twice daily.   #Check multiple myeloma panel; kappa lambda light chain ratio.  Check parathyroid hormone levels.  # Thank you Dr.Agbata for allowing me to participate in the care of your pleasant patient. Please do not hesitate to contact me with questions or concerns in the interim.  The above plan of care was discussed with the patient's 2 daughters by the bedside in detail.  # I reviewed the blood work- with the patient in detail; also reviewed the imaging independently [as summarized above]; and with the patient in detail.

## 2021-05-01 NOTE — Progress Notes (Signed)
Pt admitted to room 136 from ED. Pt alert to self. Daughters at bedside. NS infusing, tele placed. Bed in lowest position call bell in reach and bed alarm on.

## 2021-05-01 NOTE — ED Provider Notes (Signed)
[facility] Provider Note    Event Date/Time   First MD Initiated Contact with Patient 05/01/21 [phone]     (approximate)   History   Fatigue   HPI  Cristina Morgan is a 87 y.o. female who presents to the ED for evaluation of Fatigue   I review urgent care visit from 2/2 where patient was diagnosed with 3 closed rib fractures on the left. Otherwise a history of HTN, HLD, DM  Patient is brought to the ED by EMS for evaluation of increasing fatigue, altered mentation and generalized weakness for the past few days.  She lives at home with her husband.  Patient does not provide any relevant history due to her disorientation and weakness.  Denies pain anywhere.  Majority of history is provided by the daughter as to later arrived at the bedside.  They report seeing her last night and she was not quite right, seem more tired and "off."  They report that she is typically independently ambulatory, conversational and sharp.  Her current presentation is very abnormal and unusual.  Physical Exam   Triage Vital Signs: ED Triage Vitals  Enc Vitals Group     BP 05/01/21 0311 (!) 176/82     Pulse Rate 05/01/21 0311 (!) 105     Resp 05/01/21 0311 16     Temp --      Temp src --      SpO2 05/01/21 0311 95 %     Weight 05/01/21 0305 121 lb 4.1 oz (55 kg)     Height 05/01/21 0305 _0  (1.702 m)     Head Circumference --      Peak Flow --      Pain Score 05/01/21 0305 0     Pain Loc --      Pain Edu? --      Excl. in [place]? --     Most recent vital signs: Vitals:   05/01/21 0400 05/01/21 0600  BP: (!) 157/79 (!) 171/91  Pulse: 95 92  Resp: 16 18  Temp:    SpO2: 100% 99%    General: Somnolent.  Arouses to vocal stimulation, opens her eyes and looks at me, provides a couple one-word answers.  Does follow simple commands such as sticking out her tongue, opening her mouth and moving hands/feet.  CV:  Good peripheral perfusion.  Tachycardic and  regular. Resp:  Normal effort.  CTA B Abd:  No distention.  Soft and benign MSK:  No deformity noted.  No signs of trauma or injuries Neuro:  No focal deficits appreciated.  Follows commands with all 4 extremities.  No signs of deficits Other:     ED Results / Procedures / Treatments   Labs (all labs ordered are listed, but only abnormal results are displayed) Labs Reviewed  COMPREHENSIVE METABOLIC PANEL - Abnormal; Notable for the following components:      Result Value   Glucose, Bld 253 (*)    BUN 24 (*)    Calcium 11.4 (*)    All other components within normal limits  CBC WITH DIFFERENTIAL/PLATELET - Abnormal; Notable for the following components:   RBC 3.36 (*)    Hemoglobin 10.5 (*)    HCT 33.3 (*)    All other components within normal limits  URINALYSIS, ROUTINE W REFLEX MICROSCOPIC - Abnormal; Notable for the following components:   Color, Urine YELLOW (*)    APPearance CLOUDY (*)    Glucose, UA 150 (*)  Ketones, ur 20 (*)    Protein, ur 30 (*)    Bacteria, UA RARE (*)    All other components within normal limits  CBG MONITORING, ED - Abnormal; Notable for the following components:   Glucose-Capillary 218 (*)    All other components within normal limits  RESP PANEL BY RT-PCR (FLU A&B, COVID) ARPGX2  LIPASE, BLOOD  LACTIC ACID, PLASMA  CALCIUM, IONIZED    EKG Sinus rhythm, rate of 97 bpm.  Normal axis and intervals.  No STEMI.  Poor R wave progression.  RADIOLOGY CT head reviewed by me without evidence of acute intracranial pathology.  CT chest reviewed by me with no pneumonia, but multitude of bony lesions concerning for mets CXR reviewed by me without lobar infiltrate  Official radiology report(s): CT HEAD WO CONTRAST (5MM)  Result Date: 05/01/2021 CLINICAL DATA:  Mental status change, unknown cause EXAM: CT HEAD WITHOUT CONTRAST TECHNIQUE: Contiguous axial images were obtained from the base of the skull through the vertex without intravenous contrast.  RADIATION DOSE REDUCTION: This exam was performed according to the departmental dose-optimization program which includes automated exposure control, adjustment of the mA and/or kV according to patient size and/or use of iterative reconstruction technique. COMPARISON:  None. FINDINGS: Brain: No acute intracranial abnormality. Specifically, no hemorrhage, hydrocephalus, mass lesion, acute infarction, or significant intracranial injury. Vascular: No hyperdense vessel or unexpected calcification. Skull: No acute calvarial abnormality. Sinuses/Orbits: No acute findings Other: None IMPRESSION: No acute intracranial abnormality. Electronically Signed   By: Rolm Baptise M.D.   On: 05/01/2021 03:39   CT Chest Wo Contrast  Result Date: 05/01/2021 CLINICAL DATA:  Evaluate for pneumonia. EXAM: CT CHEST WITHOUT CONTRAST TECHNIQUE: Multidetector CT imaging of the chest was performed following the standard protocol without IV contrast. RADIATION DOSE REDUCTION: This exam was performed according to the departmental dose-optimization program which includes automated exposure control, adjustment of the mA and/or kV according to patient size and/or use of iterative reconstruction technique. COMPARISON:  None. FINDINGS: Cardiovascular: Mild cardiac enlargement. No pericardial effusion. Aortic atherosclerosis and coronary artery atherosclerotic calcifications. Increase caliber of the main pulmonary artery compatible with PA hypertension. Mediastinum/Nodes: No enlarged mediastinal or axillary lymph nodes. Thyroid gland, trachea, and esophagus demonstrate no significant findings. Lungs/Pleura: No pleural effusion. No airspace consolidation. Areas of subsegmental atelectasis noted within both lower lung zones and lingula. 4 mm perifissural nodule in the right middle lobe is identified, image 61/3. No suspicious nodule or mass identified. Upper Abdomen: No acute findings within the imaged portions of the upper abdomen. Musculoskeletal:  The bones are markedly osteopenic. There are innumerable cortical lucencies throughout the ribs, thoracic spine and sternum concerning for metastatic disease., image 76/6. The thoracic vertebral body heights are well preserved. There are innumerable bilateral rib fractures. Some of these appear chronic or subacute in nature where as others are more acute in appearance IMPRESSION: 1. No evidence for pneumonia. 2. Bilateral areas of lower lung zone subsegmental atelectasis. No signs of pneumonia. 3. Innumerable cortical lucencies throughout the ribs, thoracic spine and sternum. Imaging findings are worrisome for diffuse osseous metastasis, lesions of multiple myeloma or diffuse metabolic bone disorder. 4. Innumerable bilateral rib fractures. Some of these appear chronic or subacute in nature and others are more acute in appearance. 5. Increase caliber of the main pulmonary artery compatible with PA hypertension. 6. Coronary artery atherosclerotic calcifications. 7. 4 mm perifissural nodule in the right middle lobe is nonspecific. No follow-up needed if patient is low-risk. Non-contrast chest CT can  be considered in 12 months if patient is high-risk. This recommendation follows the consensus statement: Guidelines for Management of Incidental Pulmonary Nodules Detected on CT Images: From the Fleischner Society 2017; Radiology 2017; 284:228-243. 8. Aortic Atherosclerosis (ICD10-I70.0). Electronically Signed   By: Kerby Moors M.D.   On: 05/01/2021 06:05   DG Chest Portable 1 View  Result Date: 05/01/2021 CLINICAL DATA:  malaise, poorly responsive EXAM: PORTABLE CHEST 1 VIEW COMPARISON:  02/18/2021 FINDINGS: Cardiomegaly, vascular congestion. No overt edema. Bibasilar atelectasis. No effusions or pneumothorax. No acute bony abnormality. IMPRESSION: Cardiomegaly, vascular congestion. Electronically Signed   By: Rolm Baptise M.D.   On: 05/01/2021 03:47    PROCEDURES and INTERVENTIONS:  .1-3 Lead EKG  Interpretation Performed by: Vladimir Crofts, MD Authorized by: Vladimir Crofts, MD     Interpretation: abnormal     ECG rate:  106   ECG rate assessment: tachycardic     Rhythm: sinus tachycardia     Ectopy: none     Conduction: normal    Medications  lactated ringers bolus 1,000 mL (1,000 mLs Intravenous New Bag/Given 05/01/21 0413)  ondansetron (ZOFRAN) injection 4 mg (4 mg Intravenous Given 05/01/21 0350)     IMPRESSION / MDM / ASSESSMENT AND PLAN / ED COURSE  I reviewed the triage vital signs and the nursing notes.  87 year old female presents to the ED with altered mentation of uncertain etiology requiring medical admission.  Certainly could represent polypharmacy considering she is on baclofen for her recent rib fractures diagnosed as an outpatient.  Initially had a higher suspicion for acute cystitis or pneumonia considering her decreased mobility in the setting of her rib fracture, but she has no evidence of these infections.  Blood work with mild hypercalcemia and hyperglycemia, but largely reassuring CMP.  CBC without leukocytosis or signs of sepsis.  No signs of pancreatitis or lactic acidosis.  CT head without evidence of ICH or stroke.  She has no laterality or signs of stroke on examination.  Due to high suspicion for pneumonia considering her recent rib fractures, CT chest without contrast obtained and demonstrates no infectious process, but a multitude of bony lesions concerning for bony metastases from some sort of primary malignancy.  Uncertain if this is related to her presenting altered mentation.  We will consult with medicine for admission.  Clinical Course as of 05/01/21 5859  Wed May 01, 2021  0353 Daughters of the patient now at the bedside. Provide supplemental history [DS]  0451 Reassessed.  Clinically similar.  Daughters remain at the bedside.  I discussed with them signs of dehydration but fairly reassuring work-up so far.  We discussed suspicion for pneumonia and CT  chest without contrast to assess for this.  They are in agreement. [DS]  2924 Reassessed and discussed CT chest results.  We discussed the possibility of underlying malignancy.  We discussed medical admission due to her altered mentation. [DS]    Clinical Course User Index [DS] Vladimir Crofts, MD     FINAL CLINICAL IMPRESSION(S) / ED DIAGNOSES   Final diagnoses:  Altered mental status, unspecified altered mental status type     Rx / DC Orders   ED Discharge Orders     None        Note:  This document was prepared using Dragon voice recognition software and may include unintentional dictation errors.   Vladimir Crofts, MD 05/01/21 [phone]

## 2021-05-01 NOTE — H&P (Signed)
Follow-up possible History and Physical    Patient: Cristina Morgan ZOX:[id number] DOB: Jul 25, 1934 DOA: 05/01/2021 DOS: the patient was seen and examined on 05/01/2021 PCP: Juline Patch, MD  Patient coming from: Home  Chief Complaint:  Chief Complaint  Patient presents with   Fatigue   Most of the history was obtained from patient's daughter at the bedside. HPI: Cristina Morgan is a 87 y.o. female with medical history significant for diabetes mellitus, hypertension, nontraumatic compression fractures who was brought into the ER by EMS for evaluation of mental status changes, increased lethargy, increased weakness and poor oral intake. Most of the history was obtained from the patient's daughter who was at the bedside and she states that her mother was in her usual state of health until 3 days ago when she developed weakness which has progressively worsened and on the day of admission became very lethargic.  She has had multiple episodes of nausea, vomiting and poor oral intake but has not complained of any pain. I am unable to do review of systems on this patient  Review of Systems: unable to review all systems due to the inability of the patient to answer questions. Past Medical History:  Diagnosis Date   Diabetes mellitus without complication ([place])    Hyperlipidemia    Hypertension    Past Surgical History:  Procedure Laterality Date   CATARACT EXTRACTION Bilateral 2014   COLONOSCOPY  2011   cleared for 5 yrs- [facility]   ECTOPIC PREGNANCY SURGERY     Social History:  reports that she has never smoked. She has never used smokeless tobacco. She reports that she does not drink alcohol and does not use drugs.  Allergies  Allergen Reactions   Latex Rash   Penicillins Other (See Comments)    Family History  Problem Relation Age of Onset   Cancer Mother        breast   Heart disease Mother    Diabetes Father    Heart disease Father    Heart attack Father    Heart attack  Brother    Diabetes Brother     Prior to Admission medications   Medication Sig Start Date End Date Taking? Authorizing Provider  acetaminophen (TYLENOL) 325 MG tablet Take 650 mg by mouth every 6 (six) hours as needed.   Yes [provider]  aspirin 81 MG chewable tablet Chew 1 tablet (81 mg total) by mouth daily. 07/06/18  Yes Juline Patch, MD  baclofen (LIORESAL) 20 MG tablet TAKE ONE TABLET BY MOUTH THREE times daily 02/21/21  Yes Juline Patch, MD  Calcium Carbonate-Vit D-Min (CALCIUM 1200 PO) Take 1 capsule by mouth daily.   Yes [provider]  cholecalciferol (VITAMIN D3) 25 MCG (1000 UNIT) tablet Take 1,000 Units by mouth daily.   Yes [provider]  glipiZIDE (GLUCOTROL XL) 10 MG 24 hr tablet Take 10 mg by mouth daily. 02/20/21  Yes [provider]  hydrOXYzine (ATARAX) 10 MG tablet Take 1 tablet (10 mg total) by mouth at bedtime as needed. 04/15/21  Yes Juline Patch, MD  lisinopril (ZESTRIL) 10 MG tablet Take 10 mg by mouth daily.   Yes [provider]  losartan (COZAAR) 25 MG tablet Take 1 tablet by mouth daily. 04/19/21  Yes [provider]  lovastatin (MEVACOR) 40 MG tablet Take 1 tablet (40 mg total) by mouth every evening. 04/15/21  Yes Juline Patch, MD  meloxicam (MOBIC) 15 MG tablet Take 1  tablet (15 mg total) by mouth daily. 11/16/20  Yes Juline Patch, MD  metFORMIN (GLUCOPHAGE) 1000 MG tablet TAKE ONE TABLET BY MOUTH TWICE DAILY 02/20/21  Yes Juline Patch, MD  Multiple Vitamin (MULTIVITAMIN ADULT PO) Take by mouth.   Yes [provider]  Omega-3 Fatty Acids (FISH OIL) 1000 MG CAPS Take 1 capsule (1,000 mg total) by mouth daily. 04/15/21  Yes Juline Patch, MD  pantoprazole (PROTONIX) 40 MG tablet Take 1 tablet (40 mg total) by mouth daily. 04/15/21  Yes Juline Patch, MD  sertraline (ZOLOFT) 25 MG tablet Take 1 tablet (25 mg total) by mouth daily. 04/15/21  Yes Juline Patch, MD  alendronate (FOSAMAX)  70 MG tablet Take 1 tablet (70 mg total) by mouth every 7 (seven) days. Take with a full glass of water on an empty stomach. 04/15/21   Juline Patch, MD  losartan (COZAAR) 50 MG tablet Take 1 tablet (50 mg total) by mouth daily. Patient not taking: Reported on 05/01/2021 04/15/21   Juline Patch, MD    Physical Exam: Vitals:   05/01/21 0400 05/01/21 0600 05/01/21 0630 05/01/21 0830  BP: (!) 157/79 (!) 171/91 (!) 176/65 (!) 176/70  Pulse: 95 92 66 65  Resp: '16 18 18 ' (!) 22  Temp:      SpO2: 100% 99% 100% 98%  Weight:      Height:       Physical Exam Vitals and nursing note reviewed.  Constitutional:      Appearance: She is normal weight.     Comments: Lethargic. Arouses to loud verbal stimuli. Unable to stay awake to provide a history.  HENT:     Head: Normocephalic and atraumatic.     Mouth/Throat:     Mouth: Mucous membranes are moist.  Eyes:     Pupils: Pupils are equal, round, and reactive to light.  Cardiovascular:     Rate and Rhythm: Normal rate and regular rhythm.  Pulmonary:     Effort: Pulmonary effort is normal.     Breath sounds: Normal breath sounds.  Abdominal:     General: Abdomen is flat. Bowel sounds are normal.     Palpations: Abdomen is soft.  Musculoskeletal:     Cervical back: Neck supple.  Skin:    General: Skin is warm and dry.  Neurological:     Comments: Unable to assess  Psychiatric:     Comments: Unable to assess     Data Reviewed: Notes from primary care and specialist visits, past discharge summaries. Prior diagnostic testing as applicable to current admission diagnoses Updated medications and problem lists for reconciliation ED course, including vitals, labs, imaging, treatment and response to treatment Triage notes and ED providers notes Glucose is 253, calcium 11.4 with an albumin of 3.8 CT scan of the head without contrast shows no acute findings CT scan of the chest without contrast shows   innumerable cortical lucencies  throughout the ribs, thoracic spine and sternum. Imaging findings are worrisome for diffuse osseous metastasis, lesions of multiple myeloma or diffuse metabolic bone disorder. Innumerable bilateral rib fractures. Some of these appear chronic or subacute in nature and others are more acute in appearance. There are no new results to review at this time.  Assessment and Plan: Principal Problem:   Hypercalcemia Active Problems:   Type 2 diabetes mellitus (HCC)   Essential hypertension   Acute metabolic encephalopathy    Hypercalcemia Most likely secondary to malignancy Rule out possible multiple  myeloma vs osseous metastasis CT scan of the chest without contrast shows innumerable cortical lucencies throughout the ribs, thoracic spine and sternum. Imaging findings are worrisome for diffuse osseous metastasis, lesions of multiple myeloma or diffuse metabolic bone disorder. Innumerable bilateral rib fractures. Some of these appear chronic or subacute in nature and others are more acute in appearance. Patient was recently found to have a complex left ovarian cyst suspected to be malignant and is supposed to have further evaluation for this. Aggressive IV fluid resuscitation Place patient on Lasix 40 mg IV daily Repeat calcium levels in a.m. We will consult oncology   Acute metabolic encephalopathy Patient is very lethargic and arouses only to loud verbal stimuli Mental status change appears to be secondary to hypercalcemia Expect improvement in patient's mental status following improvement in serum calcium levels.    Diabetes mellitus Hold oral hypoglycemic agents Place patient on sliding scale coverage and Accu-Cheks every 4 hours    Hypertension Continue lisinopril  Advance Care Planning:   Code Status: Full Code   Consults: Oncology  Family Communication: Greater than 50% of time was spent discussing patient's condition and plan of care with her daughter [name] at the  bedside.  All questions and concerns have been addressed. She verbalizes understanding and agrees with the plan  Severity of Illness: The appropriate patient status for this patient is INPATIENT. Inpatient status is judged to be reasonable and necessary in order to provide the required intensity of service to ensure the patient's safety. The patient's presenting symptoms, physical exam findings, and initial radiographic and laboratory data in the context of their chronic comorbidities is felt to place them at high risk for further clinical deterioration. Furthermore, it is not anticipated that the patient will be medically stable for discharge from the hospital within 2 midnights of admission.    * I certify that at the point of admission it is my clinical judgment that the patient will require inpatient hospital care spanning beyond 2 midnights from the point of admission due to high intensity of service, high risk for further deterioration and high frequency of surveillance required.*  Author: Collier Bullock, MD 05/01/2021 10:04 AM  For on call review [email or web address].

## 2021-05-02 ENCOUNTER — Inpatient Hospital Stay: Payer: Medicare Other

## 2021-05-02 DIAGNOSIS — I10 Essential (primary) hypertension: Secondary | ICD-10-CM

## 2021-05-02 DIAGNOSIS — E7800 Pure hypercholesterolemia, unspecified: Secondary | ICD-10-CM

## 2021-05-02 DIAGNOSIS — E119 Type 2 diabetes mellitus without complications: Secondary | ICD-10-CM

## 2021-05-02 DIAGNOSIS — L8990 Pressure ulcer of unspecified site, unspecified stage: Secondary | ICD-10-CM | POA: Insufficient documentation

## 2021-05-02 DIAGNOSIS — E538 Deficiency of other specified B group vitamins: Secondary | ICD-10-CM | POA: Diagnosis present

## 2021-05-02 DIAGNOSIS — I471 Supraventricular tachycardia, unspecified: Secondary | ICD-10-CM | POA: Clinically undetermined

## 2021-05-02 DIAGNOSIS — G9341 Metabolic encephalopathy: Secondary | ICD-10-CM

## 2021-05-02 HISTORY — DX: Supraventricular tachycardia, unspecified: I47.10

## 2021-05-02 LAB — BASIC METABOLIC PANEL
Anion gap: 8 (ref 5–15)
BUN: 14 mg/dL (ref 8–23)
CO2: 24 mmol/L (ref 22–32)
Calcium: 10.8 mg/dL — ABNORMAL HIGH (ref 8.9–10.3)
Chloride: 106 mmol/L (ref 98–111)
Creatinine, Ser: 0.79 mg/dL (ref 0.44–1.00)
GFR, Estimated: >60 mL/min (ref >60)
Glucose, Bld: 205 mg/dL — ABNORMAL HIGH (ref 70–99)
Potassium: 3.8 mmol/L (ref 3.5–5.1)
Sodium: 138 mmol/L (ref 135–145)

## 2021-05-02 LAB — CBC
HCT: 32.9 % — ABNORMAL LOW (ref 36.0–46.0)
Hemoglobin: 10.5 g/dL — ABNORMAL LOW (ref 12.0–15.0)
MCH: 31.4 pg (ref 26.0–34.0)
MCHC: 31.9 g/dL (ref 30.0–36.0)
MCV: 98.5 fL (ref 80.0–100.0)
Platelets: 288 10*3/uL (ref 150–400)
RBC: 3.34 MIL/uL — ABNORMAL LOW (ref 3.87–5.11)
RDW: 13.6 % (ref 11.5–15.5)
WBC: 12.2 10*3/uL — ABNORMAL HIGH (ref 4.0–10.5)
nRBC: 0 % (ref 0.0–0.2)

## 2021-05-02 LAB — MAGNESIUM: Magnesium: 1.1 mg/dL — ABNORMAL LOW (ref 1.7–2.4)

## 2021-05-02 LAB — GLUCOSE, CAPILLARY
Glucose-Capillary: 161 mg/dL — ABNORMAL HIGH (ref 70–99)
Glucose-Capillary: 204 mg/dL — ABNORMAL HIGH (ref 70–99)
Glucose-Capillary: 159 mg/dL — ABNORMAL HIGH (ref 70–99)
Glucose-Capillary: 126 mg/dL — ABNORMAL HIGH (ref 70–99)
Glucose-Capillary: 118 mg/dL — ABNORMAL HIGH (ref 70–99)
Glucose-Capillary: 281 mg/dL — ABNORMAL HIGH (ref 70–99)
Glucose-Capillary: 313 mg/dL — ABNORMAL HIGH (ref 70–99)

## 2021-05-02 LAB — KAPPA/LAMBDA LIGHT CHAINS
Kappa free light chain: 102.9 mg/L — ABNORMAL HIGH (ref 3.3–19.4)
Kappa, lambda light chain ratio: 13.03 — ABNORMAL HIGH (ref 0.26–1.65)
Lambda free light chains: 7.9 mg/L (ref 5.7–26.3)

## 2021-05-02 LAB — BETA 2 MICROGLOBULIN, SERUM: Beta-2 Microglobulin: 2.6 mg/L — ABNORMAL HIGH (ref 0.6–2.4)

## 2021-05-02 LAB — PHOSPHORUS: Phosphorus: 1.8 mg/dL — ABNORMAL LOW (ref 2.5–4.6)

## 2021-05-02 IMAGING — CR DG BONE SURVEY MET
1 series · 10 of 10 positions shown · non-contrast
Comparison: None.

CLINICAL DATA: Inpatient.  Hypercalcemia.

EXAM:
METASTATIC BONE SURVEY

[Series 1: dg bone survey met · 0.14mm/px · 10 of 22 slices shown]
[im 1/22]
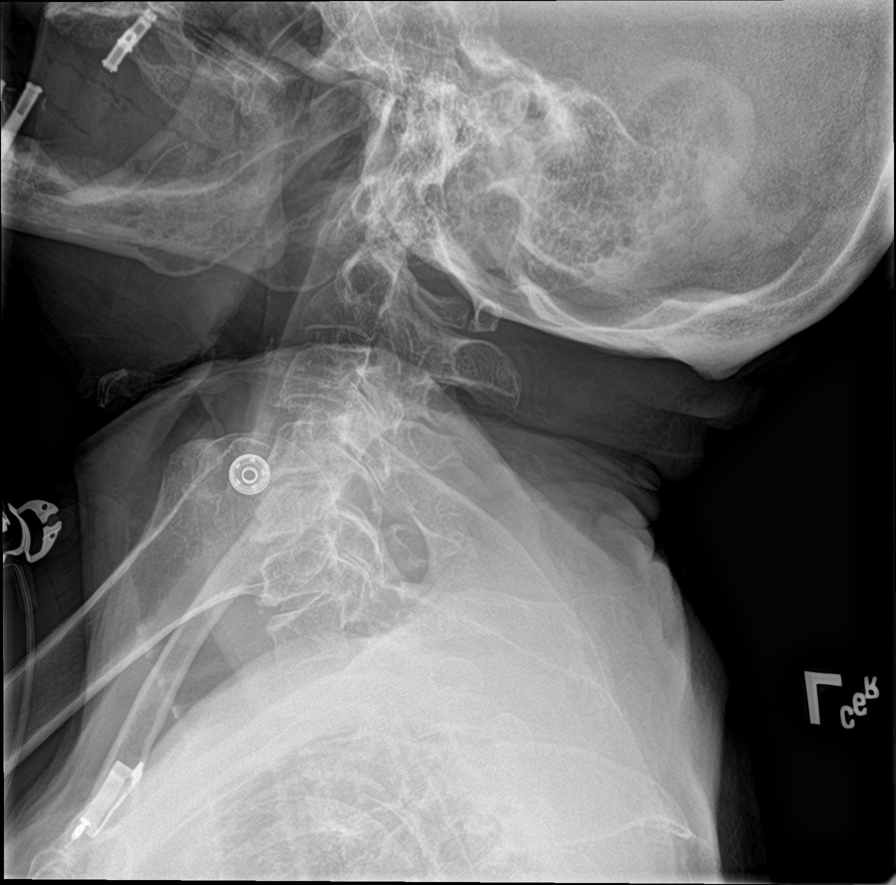
[im 3/22]
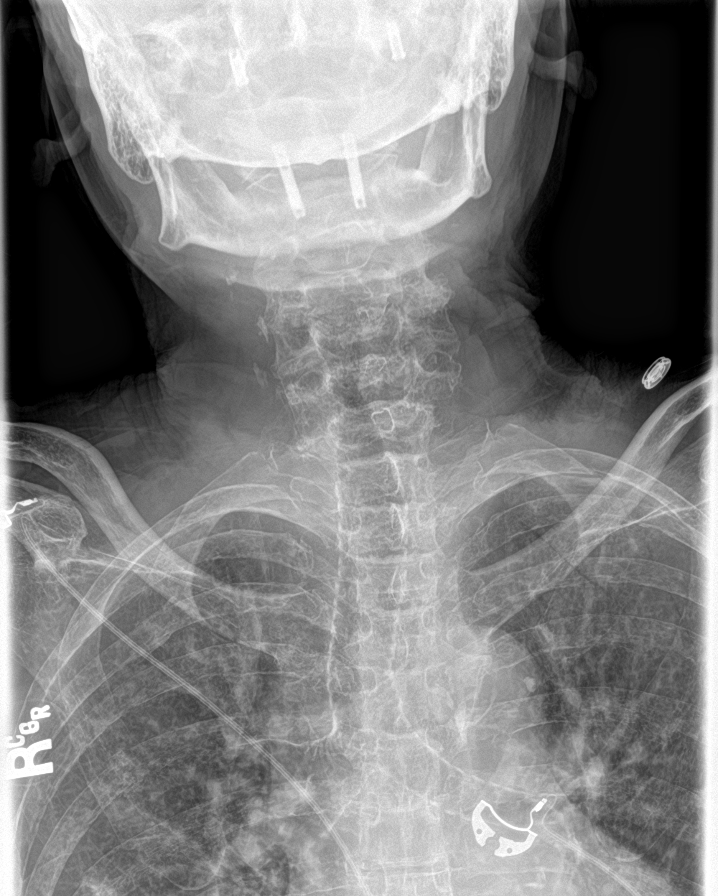
[im 5/22]
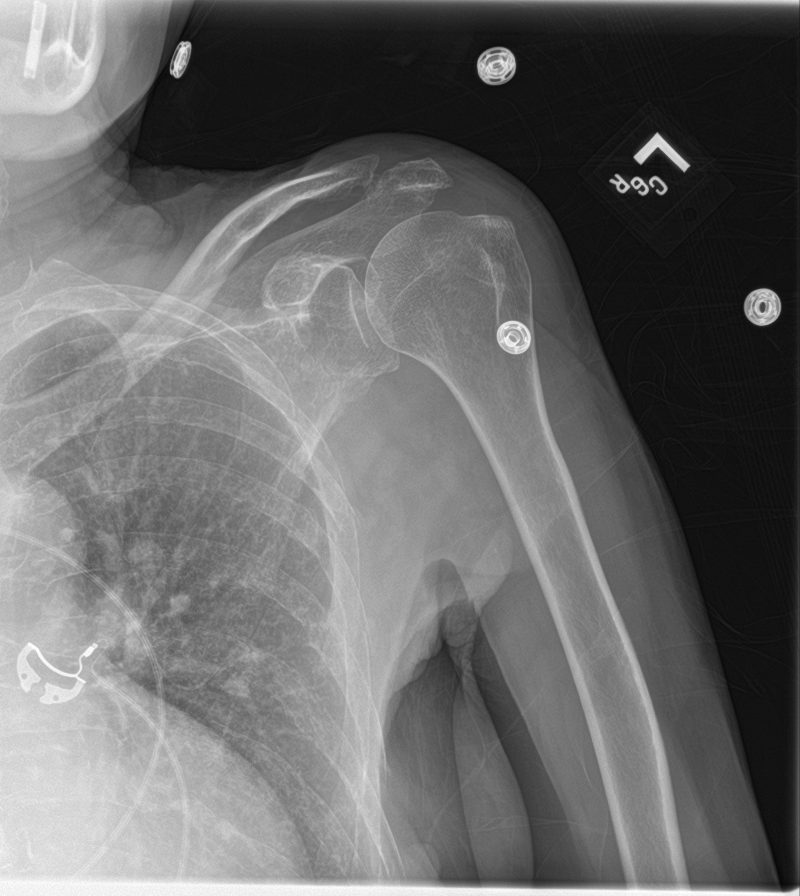
[im 8/22]
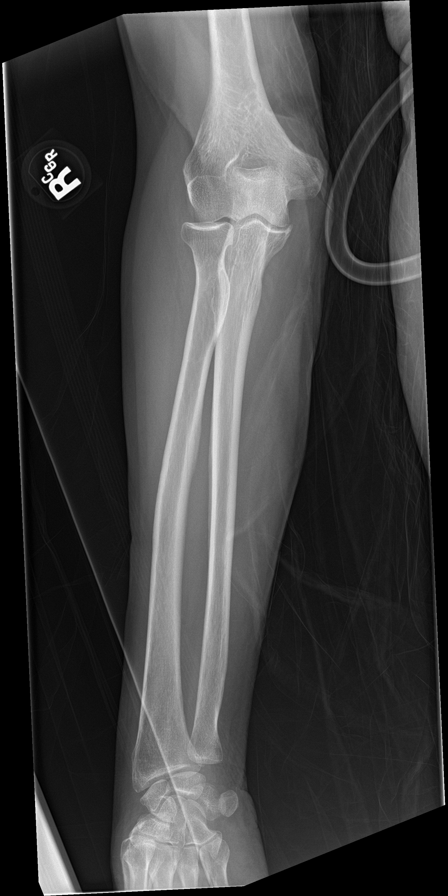
[im 10/22]
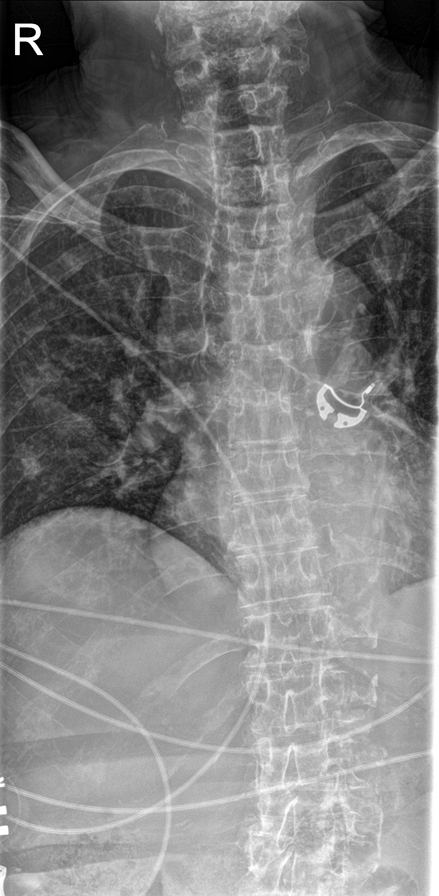
[im 12/22]
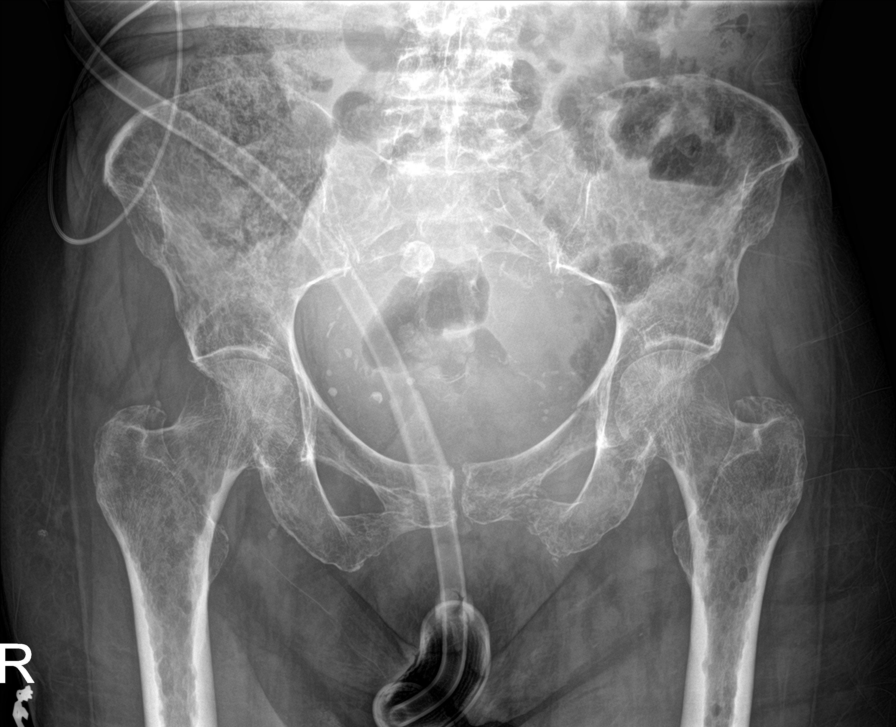
[im 15/22]
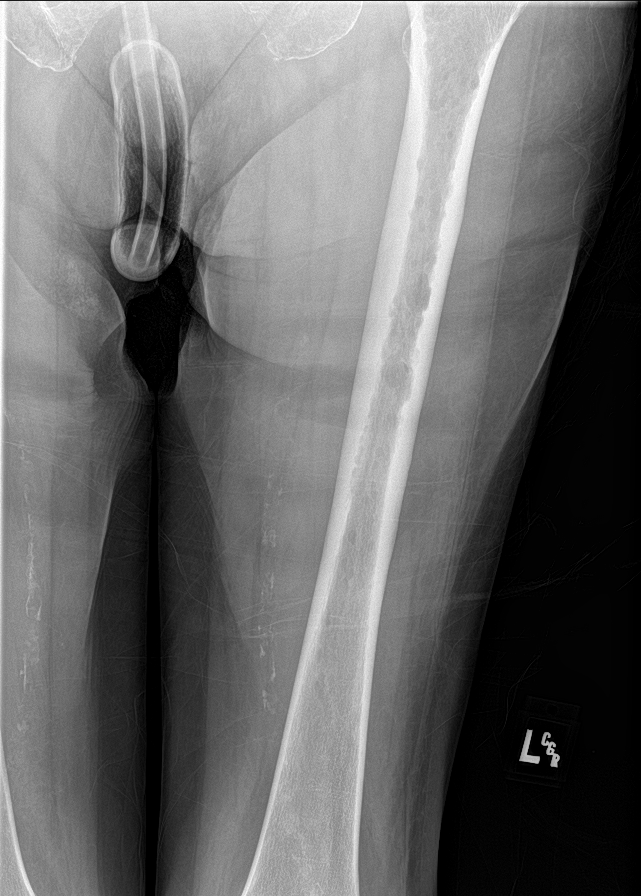
[im 17/22]
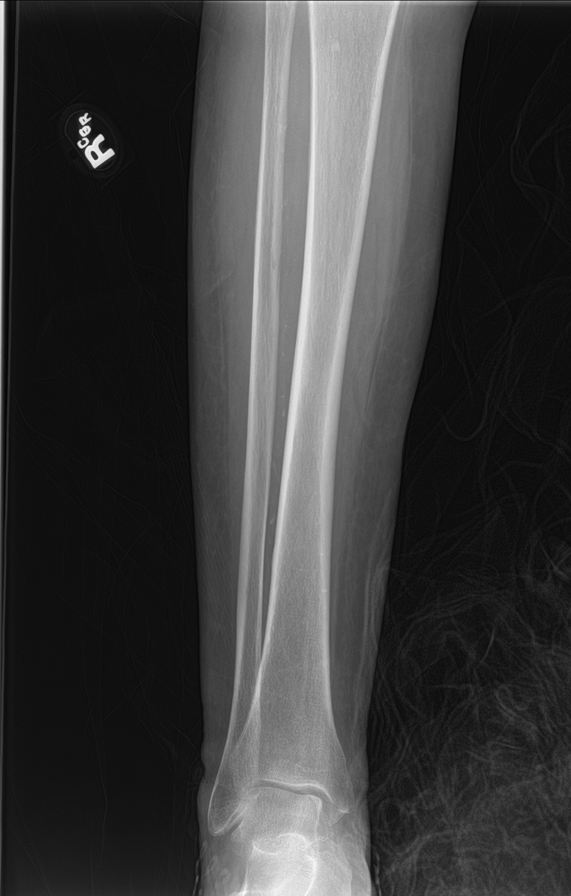
[im 19/22]
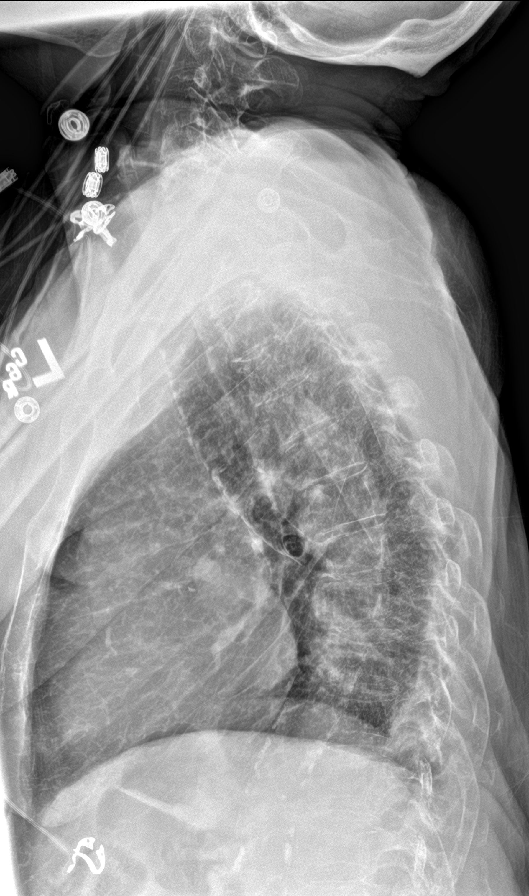
[im 22/22]
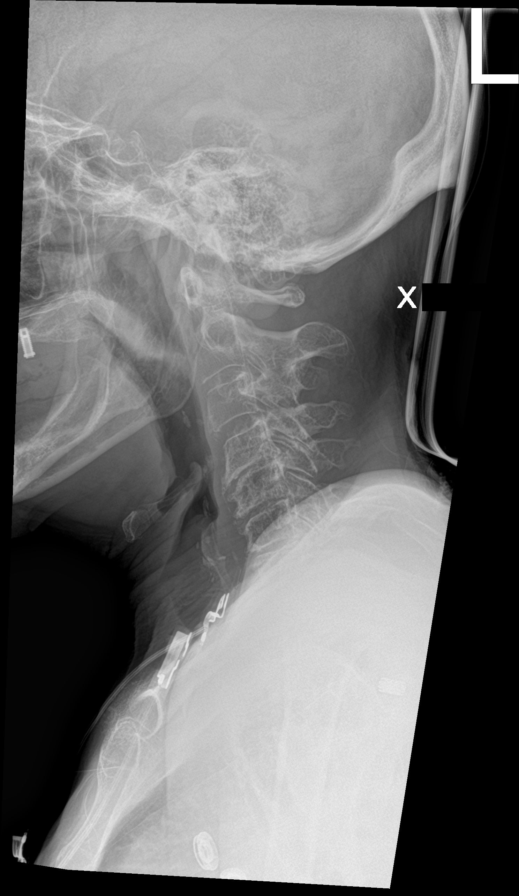

[10 of 10 positions shown; findings below may reference images not displayed]

FINDINGS: Innumerable small lucent lesions throughout the skeleton, including
the proximal and mid right humeral shaft, bilateral iliac bones,
proximal metaphyses and shafts of the bilateral femora. Severe lower
thoracic vertebral body compression fracture. Moderate L1 and severe
L2 vertebral compression fractures. Moth eaten appearance to the
ribs and spine. A few tiny lucent lesions overlying the calvarium.
Acute or early subacute lateral left fourth and fifth rib fractures.
Probable small calcified uterine fibroid overlying the sacrum to the
right of midline. Mild to moderate bilateral knee osteoarthritis.
IMPRESSION: 1. Innumerable small lucent lesions throughout the skeleton and moth
eaten appearance to the ribs and spine, compatible with multiple
myeloma.
2. Moderate L1 and severe L2 vertebral compression fractures. Severe
lower thoracic vertebral compression fracture.
3. Acute or early subacute lateral left fourth and fifth rib
fractures.

## 2021-05-02 MED ORDER — METOPROLOL TARTRATE 5 MG/5ML IV SOLN
5.0000 mg | INTRAVENOUS | Status: DC | PRN
Start: 2021-05-02 — End: 2021-05-07

## 2021-05-02 MED ORDER — CARVEDILOL 3.125 MG PO TABS
3.1250 mg | ORAL_TABLET | Freq: Two times a day (BID) | ORAL | Status: DC
  Administered 2021-05-02: 3.125 mg via ORAL
  Filled 2021-05-02: qty 1

## 2021-05-02 MED ORDER — CYANOCOBALAMIN 1000 MCG/ML IJ SOLN
1000.0000 ug | Freq: Every day | INTRAMUSCULAR | Status: DC
  Administered 2021-05-02 – 2021-05-04 (×3): 1000 ug via SUBCUTANEOUS
  Filled 2021-05-02 (×3): qty 1

## 2021-05-02 MED ORDER — MAGNESIUM SULFATE 4 GM/100ML IV SOLN
4.0000 g | Freq: Once | INTRAVENOUS | Status: AC
  Administered 2021-05-02: 4 g via INTRAVENOUS
  Filled 2021-05-02: qty 100

## 2021-05-02 MED ORDER — LOSARTAN POTASSIUM 25 MG PO TABS
25.0000 mg | ORAL_TABLET | Freq: Every day | ORAL | Status: DC
  Administered 2021-05-02 – 2021-05-07 (×6): 25 mg via ORAL
  Filled 2021-05-02 (×6): qty 1

## 2021-05-02 MED ORDER — VITAMIN D 25 MCG (1000 UNIT) PO TABS
1000.0000 [IU] | ORAL_TABLET | Freq: Every day | ORAL | Status: DC
  Administered 2021-05-02 – 2021-05-07 (×6): 1000 [IU] via ORAL
  Filled 2021-05-02 (×6): qty 1

## 2021-05-02 MED ORDER — ALUM & MAG HYDROXIDE-SIMETH 200-200-20 MG/5ML PO SUSP
30.0000 mL | ORAL | Status: DC | PRN
  Administered 2021-05-02 – 2021-05-07 (×2): 30 mL via ORAL
  Filled 2021-05-02 (×2): qty 30

## 2021-05-02 MED ORDER — POTASSIUM PHOSPHATES 15 MMOLE/5ML IV SOLN
30.0000 mmol | Freq: Once | INTRAVENOUS | Status: AC
  Administered 2021-05-02: 30 mmol via INTRAVENOUS
  Filled 2021-05-02: qty 10

## 2021-05-02 NOTE — Assessment & Plan Note (Addendum)
-   Patient noted to have some runs of SVT in the 140s to the 160s (05/02/2021) per RN and noted on telemetry -Likely secondary to electrolyte abnormalities as magnesium was noted at 1.1 and currently at 2.0 with phosphorus at 1.8 and currently at 1.7 -TSH within normal limits.   -2D echo with normal EF.NWMA.  -Electrolytes were repleted.   -Patient started on low-dose Coreg.   -Patient had no further bouts of SVT during the hospitalization.  -Outpatient follow-up with PCP.

## 2021-05-02 NOTE — Evaluation (Signed)
Occupational Therapy Evaluation Patient Details Name: Cristina Morgan MRN: [id number] DOB: 15-Jul-1934 Today's Date: 05/02/2021   History of Present Illness Pt is an 87 y/o F with PMH: DM and HTN who was admitted d/t mental status changes. She was found to have hypercalcemia and incidentally on CT scan, lytic lesions to the bone per oncology note. Metastatic bone survey reports: "Innumerable small lucent lesions throughout the skeleton and moth  eaten appearance to the ribs and spine, compatible with multiple  myeloma." Pt being seen by Dr. Lacinda Axon with neurosurgery OP And is being managed conservatively.   Clinical Impression   Pt seen for OT Evaluation this date in setting of acute hospitalization d/t hypercalcemia. Pt presents this date with decreased fxl activity tolerance, decreased strength, pain, decreased cognitive function, and generalized deconditioning. Her daughters report that, at baseline, she was MOD I for fxl mobility and ADLs (with exception of lower body for the past 6 months or so d/t chronic low back pain-being seen outpatient). They also state that she is usually mentally sharp and exhibits no confusion. She currently requires MOD To MAX A for bed mobility and demos limited sitting tolerance. She requires MAX A from elevated surface with arm in arm technique and cues for sequencing to come to standing. She has very poor tolerance for standing and demos posterior lean throughout. Extended time spent discussing d/c recommendations. Family expresses they would prefer to take her home, but would need her to be stronger than she is currently presenting. Will continue to follow acutely. Currently recommending STR f/u. Note: TLSO was recommended back in August 2022 for comfort and ambulation, but with lesions noted to the ribs on MRI, pt may not be able to tolerate.      Recommendations for follow up therapy are one component of a multi-disciplinary discharge planning process, led by the  attending physician.  Recommendations may be updated based on patient status, additional functional criteria and insurance authorization.   Follow Up Recommendations  Skilled nursing-short term rehab (<3 hours/day)    Assistance Recommended at Discharge Frequent or constant Supervision/Assistance  Patient can return home with the following A lot of help with walking and/or transfers;A lot of help with bathing/dressing/bathroom;Assistance with cooking/housework;Direct supervision/assist for medications management;Direct supervision/assist for financial management;Assist for transportation    Functional Status Assessment  Patient has had a recent decline in their functional status and demonstrates the ability to make significant improvements in function in a reasonable and predictable amount of time.  Equipment Recommendations  BSC/3in1;Tub/shower seat    Recommendations for Other Services       Precautions / Restrictions Precautions Precautions: Back Restrictions Other Position/Activity Restrictions: Oncology note indicates that pt sees Dr. Lacinda Axon OP for spine and has been managed with a brace. Dr. Grandville Silos messaged, but does not provide parameters for use of brace. Upon chart review in Care Everywhere, under plan from Dr. Remo Lipps Cook's note from 11/12/20 indicates "brace is prescribed for stability for ambulation, aid in healing and pain control". Pt's daughter who is present in room is agreeable to bringing in brace on 05/03/21.      Mobility Bed Mobility Overal bed mobility: Needs Assistance Bed Mobility: Sidelying to Sit, Sit to Sidelying   Sidelying to sit: Total assist Supine to sit: Mod assist, HOB elevated   Sit to sidelying: Max assist General bed mobility comments: mostly pain limited, does make good effort. Attempted log roll to get OOB initially, but d/t pt's current confusion, this was difficult to execute. HOB  was elevated to ~60 degrees and MOD A was used to assist with  trunk elevation to alleviate pain.    Transfers Overall transfer level: Needs assistance Equipment used: 1 person hand held assist (arm in arm) Transfers: Sit to/from Stand Sit to Stand: Max assist, From elevated surface           General transfer comment: 1x frmo EOB with moderate tactile/verbal cues d/t poor sequencing d/t confusion      Balance Overall balance assessment: Needs assistance Sitting-balance support: Feet supported Sitting balance-Leahy Scale: Poor Sitting balance - Comments: leaning back, possiblt d/t pain, but she's unable to effectively communicate that d/t confusion. waxing/waning need for assistance. Postural control: Posterior lean   Standing balance-Leahy Scale: Poor Standing balance comment: MAX A to come to static standing, does adjust well once fully elevated, but only tolerates ~2 seconds.                           ADL either performed or assessed with clinical judgement   ADL                                         General ADL Comments: SETUP to MIN A for seated UB ADLs, MAX A for seated LB ADLs d/t back precations     Vision Baseline Vision/History: 1 Wears glasses Patient Visual Report: No change from baseline       Perception     Praxis      Pertinent Vitals/Pain Pain Assessment Pain Assessment: Faces Faces Pain Scale: Hurts even more Pain Location: grimaces with any and all attempts to mobilize or reposition. Pain Descriptors / Indicators: Grimacing Pain Intervention(s): Limited activity within patient's tolerance, Monitored during session, Repositioned     Hand Dominance Right   Extremity/Trunk Assessment Upper Extremity Assessment Upper Extremity Assessment: Generalized weakness   Lower Extremity Assessment Lower Extremity Assessment: Generalized weakness       Communication Communication Communication: No difficulties   Cognition Arousal/Alertness:  (more awake than yesterday per  daughter, waxing/waning alertness during session/drowsiness. But mostly awake or easily roused with simple cueing.) Behavior During Therapy: WFL for tasks assessed/performed Overall Cognitive Status: Impaired/Different from baseline Area of Impairment: Following commands, Orientation, Memory, Safety/judgement, Problem solving                 Orientation Level: Disoriented to, Place, Time, Situation   Memory: Decreased short-term memory Following Commands: Follows one step commands with increased time Safety/Judgement: Decreased awareness of deficits   Problem Solving: Slow processing, Requires verbal cues, Requires tactile cues General Comments: Pt is pleasant, but she is confused throughout much of session.     General Comments       Exercises Other Exercises Other Exercises: OT ed with pt and dtrs re: role, importance of OOB Activity to prevent atrophy and skin breakdown. Pt's daughters verablize good understanding.   Shoulder Instructions      Home Living Family/patient expects to be discharged to:: Private residence Living Arrangements: Spouse/significant other Available Help at Discharge: Family;Available PRN/intermittently (daughters involved in care, they alterante taking pt to appts. Pt's 71 y/o granddaughter helps on a regular basis.) Type of Home: House       Home Layout: One level               Home Equipment: Conservation officer, nature (2 wheels)  Prior Functioning/Environment Prior Level of Function : Independent/Modified Independent             Mobility Comments: typically MOD I With RW for fxl mobility, has been needing transfer assist from family members in past week or so. ADLs Comments: typically INDEP for ADLs. Has been requiring increased assist in recent weeks. Admitedly limited with lower body task since her low back pain has been going on since summer of 2022.        OT Problem List: Decreased strength;Decreased activity  tolerance;Impaired balance (sitting and/or standing);Decreased cognition;Decreased safety awareness;Decreased knowledge of use of DME or AE;Pain      OT Treatment/Interventions: Self-care/ADL training;Therapeutic exercise;Therapeutic activities;Patient/family education;DME and/or AE instruction    OT Goals(Current goals can be found in the care plan section) Acute Rehab OT Goals Patient Stated Goal: to get better OT Goal Formulation: With patient/family Time For Goal Achievement: 05/16/21 Potential to Achieve Goals: Fair  OT Frequency: Min 2X/week    Co-evaluation              AM-PAC OT "6 Clicks" Daily Activity     Outcome Measure Help from another person eating meals?: None Help from another person taking care of personal grooming?: A Little Help from another person toileting, which includes using toliet, bedpan, or urinal?: A Lot Help from another person bathing (including washing, rinsing, drying)?: A Lot Help from another person to put on and taking off regular upper body clothing?: A Little Help from another person to put on and taking off regular lower body clothing?: A Lot 6 Click Score: 16   End of Session Nurse Communication: Mobility status  Activity Tolerance: Patient limited by pain Patient left: in bed;with call bell/phone within reach;with bed alarm set;with family/visitor present  OT Visit Diagnosis: Unsteadiness on feet (R26.81);Other abnormalities of gait and mobility (R26.89);Muscle weakness (generalized) (M62.81);Pain Pain - part of body:  (back)                Time: 2778-2423 OT Time Calculation (min): 48 min Charges:  OT General Charges $OT Visit: 1 Visit OT Evaluation $OT Eval Moderate Complexity: 1 Mod OT Treatments $Self Care/Home Management : 8-22 mins $Therapeutic Activity: 8-22 mins  Gerrianne Scale, MS, OTR/L ascom [phone] 05/02/21, 7:25 PM

## 2021-05-02 NOTE — Assessment & Plan Note (Addendum)
-  Patient presented with lethargy, acute metabolic encephalopathy, noted to be hypercalcemic with CT scan findings with innumerable cortical lucencies throughout the ribs, thoracic spine, sternum worrisome for diffuse osseous metastases versus lesions of multiple myeloma or diffuse metabolic bone disorder. -PTH within normal limits with elevated intact calcium. -Magnesium level at 2.0 from 2.1 from 1.4 from 1.1, phosphorus level at 1.4 from 1.7 from 2.0 from 1.5 from 1.8. -Status post Zometa. -Status post calcitonin. -Patient initially placed on IV fluids and IV Lasix. -IV Lasix subsequently discontinued and patient maintained on IV fluids. -Hypercalcemia corrected and had resolved by day of discharge. -Electrolytes repleted. -Oncology consulted and followed the patient throughout the hospitalization. -Bone skeletal survey with innumerable small lucent lesions throughout the skeleton and moth-eaten appearance to the ribs and spine compatible with multiple myeloma.  Moderate L1 and severe L2 vertebral compression fractures.  Severe lower thoracic vertebral compression fracture.  Acute or early subacute lateral left fourth and fifth rib fractures, - multiple myeloma panel ordered and -2.2 g/dL immunofixation showed IgG monoclonal protein with kappa light chain.  -Kappa lambda light chain ratio elevated at 13.03. -Free kappa light chains elevated. -Per oncology patient will need a bone marrow biopsy which was done on 05/06/2021 per IR.   -Patient subsequently started on Decadron 8 mg daily x4 days on day of discharge. -Outpatient follow-up with oncology for bone marrow biopsy results and further management.

## 2021-05-02 NOTE — Progress Notes (Signed)
PROGRESS NOTE    Cristina Morgan  PPJ:[id number] DOB: 1934/10/26 DOA: 05/01/2021 PCP: Juline Patch, MD    Chief Complaint  Patient presents with   Fatigue    Brief Narrative:  Patient is a pleasant 87 year old female history of diabetes, hypertension, nontraumatic compression fractures brought to the ED due to altered mental status changes, increased lethargy, increased weakness, poor oral intake.  Patient noted to be hypercalcemic, CT chest with innumerable cortical lucencies throughout the ribs, thoracic spine, sternum worrisome for diffuse osseous metastases, lesions of multiple myeloma or diffuse metabolic bone disorder.  Patient placed on aggressive IV fluids, IV Lasix, calcitonin, Zometa.  Oncology consulted and multiple myeloma work-up underway.    Assessment & Plan:  Principal Problem:   Hypercalcemia Active Problems:   Acute metabolic encephalopathy   Type 2 diabetes mellitus (HCC)   Hypercholesterolemia   Essential hypertension   Pressure injury of skin   Vitamin B12 deficiency   Hypomagnesemia   Hypophosphatemia   SVT (supraventricular tachycardia) (HCC)    Assessment and Plan: * Hypercalcemia - Patient presenting with lethargy, acute metabolic encephalopathy, noted to be hypercalcemic with CT scan findings with innumerable cortical lucencies throughout the ribs, thoracic spine, sternum worrisome for diffuse osseous metastases versus lesions of multiple myeloma or diffuse metabolic bone disorder. -Check a PTH. -Magnesium level at 1.1, phosphorus level at 1.8. -Patient on IV fluids, IV Lasix. -Status post Zometa, calcitonin. -Continue calcitonin. -Replete electrolytes. -Bone skeletal survey, multiple myeloma panel ordered and pending per oncology. -Oncology following and appreciate input and recommendations.  Acute metabolic encephalopathy - Likely secondary to hypercalcemia in the setting of dehydration. -No signs or symptoms of infection. -CT head with no  acute intracranial abnormalities. -Slowly improving clinically however still significantly confused and not at baseline. -Replete electrolytes, continue treatment for hypercalcemia.  SVT (supraventricular tachycardia) ([place]) - Patient noted to have some runs of SVT in the 140s to the 160s per RN and noted on telemetry -Likely secondary to electrolyte abnormalities as magnesium at 1.1, phosphorus at 1.8 -Check a 2D echo -Check a TSH. -Replete electrolytes to keep magnesium > 2.  -Repeat phosphorus. -Potassium at 3.8, K-Dur 40 milliequivalents p.o. x1. -Placed on low-dose Coreg. -IV Lopressor as needed heart rate > 130  Hypophosphatemia- (present on admission) - PTH pending. -K-Phos 30 mmol IV x1. -Repeat labs in the AM.  Hypomagnesemia- (present on admission) - Magnesium at 1.1. -Phosphorus at 1.8. -Check a PTH. -Magnesium sulfate 4 g IV x1. -Repeat labs in the morning.  Vitamin B12 deficiency- (present on admission) - Vitamin B12 1000 mcg subcutaneously daily x7 days, then weekly x1 month, then monthly.  Pressure injury of skin Pressure Injury 05/01/21 Coccyx Mid Stage 2 -  Partial thickness loss of dermis presenting as a shallow open injury with a red, pink wound bed without slough. small open area (Active)  05/01/21 1500  Location: Coccyx  Location Orientation: Mid  Staging: Stage 2 -  Partial thickness loss of dermis presenting as a shallow open injury with a red, pink wound bed without slough.  Wound Description (Comments): small open area  Present on Admission: Yes       Essential hypertension- (present on admission) - Continue home regimen Cozaar. -Start low-dose Coreg due to SVT noted per RN.  Hypercholesterolemia- (present on admission) - Continue statin.  Type 2 diabetes mellitus (HCC) - Hemoglobin A1c 5.9 (02/01/2021) -CBG 126 this morning. -Sliding scale insulin         DVT prophylaxis: Lovenox Code Status:  Full  Family Communication: Updated  husband and granddaughter at bedside. Disposition: TBD  Status is: Inpatient Remains inpatient appropriate because: Severity of illness           Consultants:  Oncology: Dr. Rogue Bussing 05/01/2021  Procedures:  CT chest 05/01/2021 CT head 05/01/2021 Bone survey pending 05/02/2021  Antimicrobials:  None   Subjective: Patient alert.  Pleasantly confused.  Denies any shortness of breath.  Some complaints of chest pain.  No nausea or vomiting.  Granddaughter and husband at bedside.  Per husband still significantly confused but slight improvement from 2 days prior to admission.  Patient noted to have some runs of SVT around lunchtime with heart rates in the 140s to 160s temporarily.  Objective: Vitals:   05/02/21 0748 05/02/21 1133 05/02/21 1137 05/02/21 1551  BP: (!) 132/57  (!) 154/60 (!) 151/62  Pulse: (!) 103  (!) 101 (!) 108  Resp: 17 19    Temp: 97.6 F (36.4 C) 98.6 F (37 C)  98.5 F (36.9 C)  TempSrc:      SpO2: 100%  95% 96%  Weight:      Height:        Intake/Output Summary (Last 24 hours) at 05/02/2021 1725 Last data filed at 05/01/2021 1738 Gross per 24 hour  Intake 998.75 ml  Output --  Net 998.75 ml   Filed Weights   05/01/21 0305  Weight: 55 kg    Examination:  General exam: Appears calm and comfortable  Respiratory system: Clear to auscultation anterior lung fields.Marland Kitchen Respiratory effort normal. Cardiovascular system: S1 & S2 heard, RRR. No JVD, murmurs, rubs, gallops or clicks. No pedal edema. Gastrointestinal system: Abdomen is nondistended, soft and nontender. No organomegaly or masses felt. Normal bowel sounds heard. Central nervous system: Alert.  Confused.  No focal neurological deficits.  Moving extremities spontaneously. Extremities: Symmetric 5 x 5 power. Skin: No rashes, lesions or ulcers Psychiatry: Judgement and insight appear normal. Mood & affect appropriate.     Data Reviewed:   CBC: Recent Labs  Lab 05/01/21 0320  05/02/21 0609  WBC 6.7 12.2*  NEUTROABS 4.3  --   HGB 10.5* 10.5*  HCT 33.3* 32.9*  MCV 99.1 98.5  PLT 314 528    Basic Metabolic Panel: Recent Labs  Lab 05/01/21 0320 05/02/21 0609  NA 135 138  K 3.7 3.8  CL 105 106  CO2 24 24  GLUCOSE 253* 205*  BUN 24* 14  CREATININE 0.85 0.79  CALCIUM 11.4* 10.8*  MG  --  1.1*  PHOS  --  1.8*    GFR: Estimated Creatinine Clearance: 44.6 mL/min (by C-G formula based on SCr of 0.79 mg/dL).  Liver Function Tests: Recent Labs  Lab 05/01/21 0320  AST 18  ALT 12  ALKPHOS 88  BILITOT 0.7  PROT 7.8  ALBUMIN 3.8    CBG: Recent Labs  Lab 05/02/21 0610 05/02/21 0735 05/02/21 0940 05/02/21 1131 05/02/21 1616  GLUCAP 204* 159* 126* 118* 281*     Recent Results (from the past 240 hour(s))  Resp Panel by RT-PCR (Flu A&B, Covid) Nasopharyngeal Swab     Status: None   Collection Time: 05/01/21  3:50 AM   Specimen: Nasopharyngeal Swab; Nasopharyngeal(NP) swabs in vial transport medium  Result Value Ref Range Status   SARS Coronavirus 2 by RT PCR NEGATIVE NEGATIVE Final    Comment: (NOTE) SARS-CoV-2 target nucleic acids are NOT DETECTED.  The SARS-CoV-2 RNA is generally detectable in upper respiratory specimens during the acute phase  of infection. The lowest concentration of SARS-CoV-2 viral copies this assay can detect is 138 copies/mL. A negative result does not preclude SARS-Cov-2 infection and should not be used as the sole basis for treatment or other patient management decisions. A negative result may occur with  improper specimen collection/handling, submission of specimen other than nasopharyngeal swab, presence of viral mutation(s) within the areas targeted by this assay, and inadequate number of viral copies(<138 copies/mL). A negative result must be combined with clinical observations, patient history, and epidemiological information. The expected result is Negative.  Fact Sheet for Patients:   EntrepreneurPulse.com.au  Fact Sheet for Healthcare Providers:  IncredibleEmployment.be  This test is no t yet approved or cleared by the [place] FDA and  has been authorized for detection and/or diagnosis of SARS-CoV-2 by FDA under an Emergency Use Authorization (EUA). This EUA will remain  in effect (meaning this test can be used) for the duration of the COVID-19 declaration under Section 564(b)(1) of the Act, 21 U.S.C.section 360bbb-3(b)(1), unless the authorization is terminated  or revoked sooner.       Influenza A by PCR NEGATIVE NEGATIVE Final   Influenza B by PCR NEGATIVE NEGATIVE Final    Comment: (NOTE) The Xpert Xpress SARS-CoV-2/FLU/RSV plus assay is intended as an aid in the diagnosis of influenza from Nasopharyngeal swab specimens and should not be used as a sole basis for treatment. Nasal washings and aspirates are unacceptable for Xpert Xpress SARS-CoV-2/FLU/RSV testing.  Fact Sheet for Patients: EntrepreneurPulse.com.au  Fact Sheet for Healthcare Providers: IncredibleEmployment.be  This test is not yet approved or cleared by the [place] FDA and has been authorized for detection and/or diagnosis of SARS-CoV-2 by FDA under an Emergency Use Authorization (EUA). This EUA will remain in effect (meaning this test can be used) for the duration of the COVID-19 declaration under Section 564(b)(1) of the Act, 21 U.S.C. section 360bbb-3(b)(1), unless the authorization is terminated or revoked.  Performed at [facility], [address] [place], [place] [address]          Radiology Studies: CT HEAD WO CONTRAST (5MM)  Result Date: 05/01/2021 CLINICAL DATA:  Mental status change, unknown cause EXAM: CT HEAD WITHOUT CONTRAST TECHNIQUE: Contiguous axial images were obtained from the base of the skull through the vertex without intravenous contrast. RADIATION DOSE  REDUCTION: This exam was performed according to the departmental dose-optimization program which includes automated exposure control, adjustment of the mA and/or kV according to patient size and/or use of iterative reconstruction technique. COMPARISON:  None. FINDINGS: Brain: No acute intracranial abnormality. Specifically, no hemorrhage, hydrocephalus, mass lesion, acute infarction, or significant intracranial injury. Vascular: No hyperdense vessel or unexpected calcification. Skull: No acute calvarial abnormality. Sinuses/Orbits: No acute findings Other: None IMPRESSION: No acute intracranial abnormality. Electronically Signed   By: Rolm Baptise M.D.   On: 05/01/2021 03:39   CT Chest Wo Contrast  Result Date: 05/01/2021 CLINICAL DATA:  Evaluate for pneumonia. EXAM: CT CHEST WITHOUT CONTRAST TECHNIQUE: Multidetector CT imaging of the chest was performed following the standard protocol without IV contrast. RADIATION DOSE REDUCTION: This exam was performed according to the departmental dose-optimization program which includes automated exposure control, adjustment of the mA and/or kV according to patient size and/or use of iterative reconstruction technique. COMPARISON:  None. FINDINGS: Cardiovascular: Mild cardiac enlargement. No pericardial effusion. Aortic atherosclerosis and coronary artery atherosclerotic calcifications. Increase caliber of the main pulmonary artery compatible with PA hypertension. Mediastinum/Nodes: No enlarged mediastinal or axillary lymph nodes. Thyroid gland, trachea,  and esophagus demonstrate no significant findings. Lungs/Pleura: No pleural effusion. No airspace consolidation. Areas of subsegmental atelectasis noted within both lower lung zones and lingula. 4 mm perifissural nodule in the right middle lobe is identified, image 61/3. No suspicious nodule or mass identified. Upper Abdomen: No acute findings within the imaged portions of the upper abdomen. Musculoskeletal: The bones are  markedly osteopenic. There are innumerable cortical lucencies throughout the ribs, thoracic spine and sternum concerning for metastatic disease., image 76/6. The thoracic vertebral body heights are well preserved. There are innumerable bilateral rib fractures. Some of these appear chronic or subacute in nature where as others are more acute in appearance IMPRESSION: 1. No evidence for pneumonia. 2. Bilateral areas of lower lung zone subsegmental atelectasis. No signs of pneumonia. 3. Innumerable cortical lucencies throughout the ribs, thoracic spine and sternum. Imaging findings are worrisome for diffuse osseous metastasis, lesions of multiple myeloma or diffuse metabolic bone disorder. 4. Innumerable bilateral rib fractures. Some of these appear chronic or subacute in nature and others are more acute in appearance. 5. Increase caliber of the main pulmonary artery compatible with PA hypertension. 6. Coronary artery atherosclerotic calcifications. 7. 4 mm perifissural nodule in the right middle lobe is nonspecific. No follow-up needed if patient is low-risk. Non-contrast chest CT can be considered in 12 months if patient is high-risk. This recommendation follows the consensus statement: Guidelines for Management of Incidental Pulmonary Nodules Detected on CT Images: From the Fleischner Society 2017; Radiology 2017; 284:228-243. 8. Aortic Atherosclerosis (ICD10-I70.0). Electronically Signed   By: Kerby Moors M.D.   On: 05/01/2021 06:05   DG Chest Portable 1 View  Result Date: 05/01/2021 CLINICAL DATA:  malaise, poorly responsive EXAM: PORTABLE CHEST 1 VIEW COMPARISON:  02/18/2021 FINDINGS: Cardiomegaly, vascular congestion. No overt edema. Bibasilar atelectasis. No effusions or pneumothorax. No acute bony abnormality. IMPRESSION: Cardiomegaly, vascular congestion. Electronically Signed   By: Rolm Baptise M.D.   On: 05/01/2021 03:47   DG Bone Survey Met  Result Date: 05/02/2021 CLINICAL DATA:  Inpatient.   Hypercalcemia. EXAM: METASTATIC BONE SURVEY COMPARISON:  None. FINDINGS: Innumerable small lucent lesions throughout the skeleton, including the proximal and mid right humeral shaft, bilateral iliac bones, proximal metaphyses and shafts of the bilateral femora. Severe lower thoracic vertebral body compression fracture. Moderate L1 and severe L2 vertebral compression fractures. Moth eaten appearance to the ribs and spine. A few tiny lucent lesions overlying the calvarium. Acute or early subacute lateral left fourth and fifth rib fractures. Probable small calcified uterine fibroid overlying the sacrum to the right of midline. Mild to moderate bilateral knee osteoarthritis. IMPRESSION: 1. Innumerable small lucent lesions throughout the skeleton and moth eaten appearance to the ribs and spine, compatible with multiple myeloma. 2. Moderate L1 and severe L2 vertebral compression fractures. Severe lower thoracic vertebral compression fracture. 3. Acute or early subacute lateral left fourth and fifth rib fractures. Electronically Signed   By: Ilona Sorrel M.D.   On: 05/02/2021 10:04        Scheduled Meds:  aspirin  81 mg Oral Daily   calcitonin  4 Units/kg Intramuscular BID   carvedilol  3.125 mg Oral BID WC   cholecalciferol  1,000 Units Oral Daily   cyanocobalamin  1,000 mcg Subcutaneous Daily   enoxaparin (LOVENOX) injection  40 mg Subcutaneous Q24H   furosemide  40 mg Intravenous Daily   insulin aspart  0-15 Units Subcutaneous Q4H   losartan  25 mg Oral Daily   multivitamin with minerals  1 tablet  Oral Daily   omega-3 acid ethyl esters  1 capsule Oral Daily   pantoprazole  40 mg Oral Daily   pravastatin  40 mg Oral q1800   sertraline  25 mg Oral Daily   Continuous Infusions:  sodium chloride 125 mL/hr at 05/01/21 1029   potassium PHOSPHATE IVPB (in mmol) 30 mmol (05/02/21 1257)     LOS: 1 day    Time spent: 40 minutes    Irine Seal, MD [facility]   To contact the  attending provider between 7A-7P or the covering provider during after hours 7P-7A, please log into the web site [email or web address] and access using universal [place] password for that web site. If you do not have the password, please call the hospital operator.  05/02/2021, 5:25 PM

## 2021-05-02 NOTE — Assessment & Plan Note (Addendum)
Patient maintained on home regimen statin. ?

## 2021-05-02 NOTE — Assessment & Plan Note (Signed)
Pressure Injury 05/01/21 Coccyx Mid Stage 2 -  Partial thickness loss of dermis presenting as a shallow open injury with a red, pink wound bed without slough. small open area (Active)  05/01/21 1500  Location: Coccyx  Location Orientation: Mid  Staging: Stage 2 -  Partial thickness loss of dermis presenting as a shallow open injury with a red, pink wound bed without slough.  Wound Description (Comments): small open area  Present on Admission: Yes

## 2021-05-02 NOTE — Plan of Care (Signed)
°  Problem: Education: Goal: Knowledge of General Education information will improve Description: Including pain rating scale, medication(s)/side effects and non-pharmacologic comfort measures Outcome: Progressing   Problem: Health Behavior/Discharge Planning: Goal: Ability to manage health-related needs will improve Outcome: Progressing   Problem: Clinical Measurements: Goal: Ability to maintain clinical measurements within normal limits will improve Outcome: Progressing Goal: Will remain free from infection Outcome: Progressing Goal: Diagnostic test results will improve Outcome: Progressing Goal: Respiratory complications will improve Outcome: Progressing Goal: Cardiovascular complication will be avoided Outcome: Progressing   Problem: Activity: Goal: Risk for activity intolerance will decrease Outcome: Progressing   Problem: Nutrition: Goal: Adequate nutrition will be maintained Outcome: Progressing   Problem: Coping: Goal: Level of anxiety will decrease Outcome: Progressing   Problem: Nutrition: Goal: Adequate nutrition will be maintained Outcome: Progressing

## 2021-05-02 NOTE — Care Management Important Message (Signed)
Important Message  Patient Details  Name: Cristina Morgan MRN: [id number] Date of Birth: 04-Jan-1935   Medicare Important Message Given:  N/A - LOS <3 / Initial given by admissions     Dannette Barbara 05/02/2021, 4:41 PM

## 2021-05-02 NOTE — Assessment & Plan Note (Addendum)
-   PTH within normal limits. -Phosphorus levels was repleted during the hospitalization.  -Patient was discharged home on 3 days of oral phosphorus supplementation.   -Outpatient follow-up with PCP.

## 2021-05-02 NOTE — Assessment & Plan Note (Addendum)
-   Repeat vitamin B12 levels at 3868.   -Discontinued vitamin B12 supplementation.   -Outpatient follow-up.

## 2021-05-02 NOTE — Progress Notes (Signed)
Cristina Morgan   DOB:1934-10-20   WG#:[id number]    Subjective: No acute events overnight.  Patient continues to intermittently confused.  Objective:  Vitals:   05/02/21 1551 05/02/21 1957  BP: (!) 151/62 (!) 144/60  Pulse: (!) 108 92  Resp:  16  Temp: 98.5 F (36.9 C) 98.7 F (37.1 C)  SpO2: 96% 96%     Intake/Output Summary (Last 24 hours) at 05/02/2021 2113 Last data filed at 05/02/2021 1859 Gross per 24 hour  Intake 240 ml  Output --  Net 240 ml   Elderly cachectic appearing female patient.  Alert oriented x0-1.  Accompanied by daughter. Physical Exam Vitals and nursing note reviewed.  HENT:     Head: Normocephalic and atraumatic.     Mouth/Throat:     Pharynx: Oropharynx is clear.  Eyes:     Extraocular Movements: Extraocular movements intact.     Pupils: Pupils are equal, round, and reactive to light.  Cardiovascular:     Rate and Rhythm: Normal rate and regular rhythm.  Pulmonary:     Comments: Decreased breath sounds bilaterally.  Abdominal:     Palpations: Abdomen is soft.  Musculoskeletal:        General: Normal range of motion.     Cervical back: Normal range of motion.  Skin:    General: Skin is warm.  Neurological:     General: No focal deficit present.     Mental Status: She is alert.     Labs:  Lab Results  Component Value Date   WBC 12.2 (H) 05/02/2021   HGB 10.5 (L) 05/02/2021   HCT 32.9 (L) 05/02/2021   MCV 98.5 05/02/2021   PLT 288 05/02/2021   NEUTROABS 4.3 05/01/2021    Lab Results  Component Value Date   NA 138 05/02/2021   K 3.8 05/02/2021   CL 106 05/02/2021   CO2 24 05/02/2021    Studies:  CT HEAD WO CONTRAST (5MM)  Result Date: 05/01/2021 CLINICAL DATA:  Mental status change, unknown cause EXAM: CT HEAD WITHOUT CONTRAST TECHNIQUE: Contiguous axial images were obtained from the base of the skull through the vertex without intravenous contrast. RADIATION DOSE REDUCTION: This exam was performed according to the departmental  dose-optimization program which includes automated exposure control, adjustment of the mA and/or kV according to patient size and/or use of iterative reconstruction technique. COMPARISON:  None. FINDINGS: Brain: No acute intracranial abnormality. Specifically, no hemorrhage, hydrocephalus, mass lesion, acute infarction, or significant intracranial injury. Vascular: No hyperdense vessel or unexpected calcification. Skull: No acute calvarial abnormality. Sinuses/Orbits: No acute findings Other: None IMPRESSION: No acute intracranial abnormality. Electronically Signed   By: Rolm Baptise M.D.   On: 05/01/2021 03:39   CT Chest Wo Contrast  Result Date: 05/01/2021 CLINICAL DATA:  Evaluate for pneumonia. EXAM: CT CHEST WITHOUT CONTRAST TECHNIQUE: Multidetector CT imaging of the chest was performed following the standard protocol without IV contrast. RADIATION DOSE REDUCTION: This exam was performed according to the departmental dose-optimization program which includes automated exposure control, adjustment of the mA and/or kV according to patient size and/or use of iterative reconstruction technique. COMPARISON:  None. FINDINGS: Cardiovascular: Mild cardiac enlargement. No pericardial effusion. Aortic atherosclerosis and coronary artery atherosclerotic calcifications. Increase caliber of the main pulmonary artery compatible with PA hypertension. Mediastinum/Nodes: No enlarged mediastinal or axillary lymph nodes. Thyroid gland, trachea, and esophagus demonstrate no significant findings. Lungs/Pleura: No pleural effusion. No airspace consolidation. Areas of subsegmental atelectasis noted within both lower lung zones and lingula.  4 mm perifissural nodule in the right middle lobe is identified, image 61/3. No suspicious nodule or mass identified. Upper Abdomen: No acute findings within the imaged portions of the upper abdomen. Musculoskeletal: The bones are markedly osteopenic. There are innumerable cortical lucencies  throughout the ribs, thoracic spine and sternum concerning for metastatic disease., image 76/6. The thoracic vertebral body heights are well preserved. There are innumerable bilateral rib fractures. Some of these appear chronic or subacute in nature where as others are more acute in appearance IMPRESSION: 1. No evidence for pneumonia. 2. Bilateral areas of lower lung zone subsegmental atelectasis. No signs of pneumonia. 3. Innumerable cortical lucencies throughout the ribs, thoracic spine and sternum. Imaging findings are worrisome for diffuse osseous metastasis, lesions of multiple myeloma or diffuse metabolic bone disorder. 4. Innumerable bilateral rib fractures. Some of these appear chronic or subacute in nature and others are more acute in appearance. 5. Increase caliber of the main pulmonary artery compatible with PA hypertension. 6. Coronary artery atherosclerotic calcifications. 7. 4 mm perifissural nodule in the right middle lobe is nonspecific. No follow-up needed if patient is low-risk. Non-contrast chest CT can be considered in 12 months if patient is high-risk. This recommendation follows the consensus statement: Guidelines for Management of Incidental Pulmonary Nodules Detected on CT Images: From the Fleischner Society 2017; Radiology 2017; 284:228-243. 8. Aortic Atherosclerosis (ICD10-I70.0). Electronically Signed   By: Kerby Moors M.D.   On: 05/01/2021 06:05   DG Chest Portable 1 View  Result Date: 05/01/2021 CLINICAL DATA:  malaise, poorly responsive EXAM: PORTABLE CHEST 1 VIEW COMPARISON:  02/18/2021 FINDINGS: Cardiomegaly, vascular congestion. No overt edema. Bibasilar atelectasis. No effusions or pneumothorax. No acute bony abnormality. IMPRESSION: Cardiomegaly, vascular congestion. Electronically Signed   By: Rolm Baptise M.D.   On: 05/01/2021 03:47   DG Bone Survey Met  Result Date: 05/02/2021 CLINICAL DATA:  Inpatient.  Hypercalcemia. EXAM: METASTATIC BONE SURVEY COMPARISON:  None.  FINDINGS: Innumerable small lucent lesions throughout the skeleton, including the proximal and mid right humeral shaft, bilateral iliac bones, proximal metaphyses and shafts of the bilateral femora. Severe lower thoracic vertebral body compression fracture. Moderate L1 and severe L2 vertebral compression fractures. Moth eaten appearance to the ribs and spine. A few tiny lucent lesions overlying the calvarium. Acute or early subacute lateral left fourth and fifth rib fractures. Probable small calcified uterine fibroid overlying the sacrum to the right of midline. Mild to moderate bilateral knee osteoarthritis. IMPRESSION: 1. Innumerable small lucent lesions throughout the skeleton and moth eaten appearance to the ribs and spine, compatible with multiple myeloma. 2. Moderate L1 and severe L2 vertebral compression fractures. Severe lower thoracic vertebral compression fracture. 3. Acute or early subacute lateral left fourth and fifth rib fractures. Electronically Signed   By: Ilona Sorrel M.D.   On: 05/02/2021 43:11     #87 year old female patient with no prior history of malignancy-is currently admitted to hospital for mental status changes/hypercalcemia calcium 11.1; incidentally on CT scan.  Patient noted to have multiple lytic lesions in the bone.    #Hypercalcemia-calcium 11.2 on admission.  CT chest multiple bone lesions-lytic in the bone; mild anemia; however normal renal function-concerning for underlying malignancy like multiple myeloma versus others.  Check bone survey.  Multiple myeloma panel pending; kappa/lambda light chain ratio elevated 12.  calcium slightly improved.  Continue calcitonin.  S/p Zometa on 2/15.    #Mental status changes likely secondary to hypercalcemia; CT head noncontrast negative-mental status slightly improved not back to baseline yet.  #  Multiple thoracic fractures [followed by Dr.Cook at [place]   #Electrolyte abnormalities-hypomagnesemia/hypophosphatemia-as per primary team.     Discussed with Dr. Grandville Silos.  Discussed with the patient's daughter by the bedside.  Addendum: Bone survey history of multiple lytic lesions; would recommend bone marrow biopsy as part of further work-up.  Will discuss with daughter in the morning tomorrow.  Cammie Sickle, MD 05/02/2021  9:13 PM

## 2021-05-02 NOTE — Assessment & Plan Note (Addendum)
-   Repleted. -Potassium at 3.7. 

## 2021-05-02 NOTE — Assessment & Plan Note (Addendum)
-   Likely secondary to hypercalcemia in the setting of dehydration. -No signs or symptoms of infection. -CT head with no acute intracranial abnormalities. -Patient improved clinically during the hospitalization.   -Electrolytes were repleted as well as treatment for hypercalcemia.   -IV Lasix discontinued.   -Patient was discharged home in stable and improved condition.

## 2021-05-02 NOTE — Hospital Course (Signed)
Patient is a pleasant 87 year old female history of diabetes, hypertension, nontraumatic compression fractures brought to the ED due to altered mental status changes, increased lethargy, increased weakness, poor oral intake.  Patient noted to be hypercalcemic, CT chest with innumerable cortical lucencies throughout the ribs, thoracic spine, sternum worrisome for diffuse osseous metastases, lesions of multiple myeloma or diffuse metabolic bone disorder.  Patient placed on aggressive IV fluids, IV Lasix, calcitonin, Zometa.  Oncology consulted and multiple myeloma work-up underway.

## 2021-05-02 NOTE — Progress Notes (Signed)
Pt had large incontinent urine and pw is now in place to capture urine OP

## 2021-05-02 NOTE — Assessment & Plan Note (Addendum)
-   BP noted to be borderline on 05/04/2021. -Coreg decreased to 3.125 mg twice daily. -Patient maintained on home regimen Cozaar. -Outpatient follow-up with PCP.

## 2021-05-02 NOTE — Assessment & Plan Note (Addendum)
-   Hemoglobin A1c 5.9 (02/01/2021) -Patient's oral hypoglycemic agents were held during the hospitalization patient maintained on sliding scale insulin.   -Oral hypoglycemic agents will be resumed on discharge.   -Patient instructed to check blood glucose levels 3 times daily with meals as patient being discharged on Decadron.

## 2021-05-03 ENCOUNTER — Inpatient Hospital Stay (HOSPITAL_COMMUNITY)
Admit: 2021-05-03 | Discharge: 2021-05-03 | Disposition: A | Discharge status: Home / Self Care | Payer: Medicare Other | Attending: Internal Medicine | Admitting: Internal Medicine

## 2021-05-03 DIAGNOSIS — I471 Supraventricular tachycardia: Secondary | ICD-10-CM

## 2021-05-03 LAB — ECHOCARDIOGRAM COMPLETE
AR max vel: 2.49 cm2
AV Area VTI: 2.61 cm2
AV Area mean vel: 2.54 cm2
AV Mean grad: 4 mmHg
AV Peak grad: 6.4 mmHg
Ao pk vel: 1.26 m/s
Area-P 1/2: 4.52 cm2
Height: 67 in
MV VTI: 3.55 cm2
S' Lateral: 2.78 cm
Weight: 2144.63 oz

## 2021-05-03 LAB — RENAL FUNCTION PANEL
Albumin: 3.1 g/dL — ABNORMAL LOW (ref 3.5–5.0)
Anion gap: 6 (ref 5–15)
BUN: 15 mg/dL (ref 8–23)
CO2: 22 mmol/L (ref 22–32)
Calcium: 9.1 mg/dL (ref 8.9–10.3)
Chloride: 103 mmol/L (ref 98–111)
Creatinine, Ser: 0.63 mg/dL (ref 0.44–1.00)
GFR, Estimated: >60 mL/min (ref >60)
Glucose, Bld: 178 mg/dL — ABNORMAL HIGH (ref 70–99)
Phosphorus: 1.5 mg/dL — ABNORMAL LOW (ref 2.5–4.6)
Potassium: 3.5 mmol/L (ref 3.5–5.1)
Sodium: 131 mmol/L — ABNORMAL LOW (ref 135–145)

## 2021-05-03 LAB — FOLATE: Folate: 20.9 ng/mL (ref >5.9)

## 2021-05-03 LAB — IRON AND TIBC
Iron: 19 ug/dL — ABNORMAL LOW (ref 28–170)
Saturation Ratios: 8 % — ABNORMAL LOW (ref 10.4–31.8)
TIBC: 253 ug/dL (ref 250–450)
UIBC: 234 ug/dL

## 2021-05-03 LAB — RETICULOCYTES
Immature Retic Fract: 26.9 % — ABNORMAL HIGH (ref 2.3–15.9)
RBC.: 3.07 MIL/uL — ABNORMAL LOW (ref 3.87–5.11)
Retic Count, Absolute: 57.4 10*3/uL (ref 19.0–186.0)
Retic Ct Pct: 1.9 % (ref 0.4–3.1)

## 2021-05-03 LAB — CBC WITH DIFFERENTIAL/PLATELET
Abs Immature Granulocytes: 0.07 10*3/uL (ref 0.00–0.07)
Basophils Absolute: 0.1 10*3/uL (ref 0.0–0.1)
Basophils Relative: 1 %
Eosinophils Absolute: 0 10*3/uL (ref 0.0–0.5)
Eosinophils Relative: 0 %
HCT: 28.4 % — ABNORMAL LOW (ref 36.0–46.0)
Hemoglobin: 9.5 g/dL — ABNORMAL LOW (ref 12.0–15.0)
Immature Granulocytes: 1 %
Lymphocytes Relative: 6 %
Lymphs Abs: 0.5 10*3/uL — ABNORMAL LOW (ref 0.7–4.0)
MCH: 31.6 pg (ref 26.0–34.0)
MCHC: 33.5 g/dL (ref 30.0–36.0)
MCV: 94.4 fL (ref 80.0–100.0)
Monocytes Absolute: 0.5 10*3/uL (ref 0.1–1.0)
Monocytes Relative: 6 %
Neutro Abs: 7.4 10*3/uL (ref 1.7–7.7)
Neutrophils Relative %: 86 %
Platelets: 247 10*3/uL (ref 150–400)
RBC: 3.01 MIL/uL — ABNORMAL LOW (ref 3.87–5.11)
RDW: 13.6 % (ref 11.5–15.5)
WBC: 8.5 10*3/uL (ref 4.0–10.5)
nRBC: 0 % (ref 0.0–0.2)

## 2021-05-03 LAB — PTH, INTACT AND CALCIUM
Calcium, Total (PTH): 11 mg/dL — ABNORMAL HIGH (ref 8.7–10.3)
PTH: 29 pg/mL (ref 15–65)

## 2021-05-03 LAB — GLUCOSE, CAPILLARY
Glucose-Capillary: 106 mg/dL — ABNORMAL HIGH (ref 70–99)
Glucose-Capillary: 155 mg/dL — ABNORMAL HIGH (ref 70–99)
Glucose-Capillary: 158 mg/dL — ABNORMAL HIGH (ref 70–99)
Glucose-Capillary: 179 mg/dL — ABNORMAL HIGH (ref 70–99)
Glucose-Capillary: 189 mg/dL — ABNORMAL HIGH (ref 70–99)
Glucose-Capillary: 237 mg/dL — ABNORMAL HIGH (ref 70–99)
Glucose-Capillary: 255 mg/dL — ABNORMAL HIGH (ref 70–99)

## 2021-05-03 LAB — TSH: TSH: 0.367 u[IU]/mL (ref 0.350–4.500)

## 2021-05-03 LAB — VITAMIN B12: Vitamin B-12: 3868 pg/mL — ABNORMAL HIGH (ref 180–914)

## 2021-05-03 LAB — MAGNESIUM: Magnesium: 1.4 mg/dL — ABNORMAL LOW (ref 1.7–2.4)

## 2021-05-03 LAB — FERRITIN: Ferritin: 101 ng/mL (ref 11–307)

## 2021-05-03 LAB — CALCIUM, IONIZED: Calcium, Ionized, Serum: 7.1 mg/dL — ABNORMAL HIGH (ref 4.5–5.6)

## 2021-05-03 MED ORDER — CARVEDILOL 3.125 MG PO TABS
6.2500 mg | ORAL_TABLET | Freq: Two times a day (BID) | ORAL | Status: DC
  Administered 2021-05-03 (×2): 6.25 mg via ORAL
  Filled 2021-05-03 (×2): qty 2

## 2021-05-03 MED ORDER — HYDROXYZINE HCL 10 MG PO TABS
10.0000 mg | ORAL_TABLET | Freq: Every evening | ORAL | Status: DC | PRN
  Administered 2021-05-05: 10 mg via ORAL
  Filled 2021-05-03 (×2): qty 1

## 2021-05-03 MED ORDER — POTASSIUM CHLORIDE CRYS ER 10 MEQ PO TBCR
40.0000 meq | EXTENDED_RELEASE_TABLET | Freq: Once | ORAL | Status: AC
  Administered 2021-05-03: 40 meq via ORAL
  Filled 2021-05-03: qty 4

## 2021-05-03 MED ORDER — MAGNESIUM SULFATE 4 GM/100ML IV SOLN
4.0000 g | Freq: Once | INTRAVENOUS | Status: AC
  Administered 2021-05-03: 4 g via INTRAVENOUS
  Filled 2021-05-03: qty 100

## 2021-05-03 MED ORDER — POTASSIUM PHOSPHATES 15 MMOLE/5ML IV SOLN
30.0000 mmol | Freq: Once | INTRAVENOUS | Status: AC
  Administered 2021-05-03: 30 mmol via INTRAVENOUS
  Filled 2021-05-03: qty 10

## 2021-05-03 MED ORDER — SODIUM CHLORIDE 0.9 % IV SOLN: INTRAVENOUS | Status: AC

## 2021-05-03 NOTE — Progress Notes (Signed)
Occupational Therapy Treatment Patient Details Name: Cristina Morgan MRN: [id number] DOB: [birth year] Today's Date: 05/03/2021   History of present illness Pt is an 87 y/o F with PMH: DM and HTN who was admitted d/t mental status changes. She was found to have hypercalcemia and incidentally on CT scan, lytic lesions to the bone per oncology note. Metastatic bone survey reports: "Innumerable small lucent lesions throughout the skeleton and moth  eaten appearance to the ribs and spine, compatible with multiple  myeloma." Pt being seen by Dr. Lacinda Axon with neurosurgery OP And is being managed conservatively.   OT comments  Pt seated up in chair when OT presents for tx. She is requesting to get back to bed. Her daughter is present throughout session. Pt requires MOD A to CTS from recliner with RW, but MOD/MAX A for small shuffling steps to chair d/t confusion with sequencing and weakness. She requires moderate tactile cues to sequence, but still demos difficulty. The same is true with attempted log roll back to bed. Pt requires MOD A for repositioning using bridging technique. Pt and dtr ed re: safe use of RW and modifications for LB dressing as pt continues to have difficulty donning/doffing socks. Pt left in bed with all needs met and in reach end of session. Will continue to follow acutely.    Recommendations for follow up therapy are one component of a multi-disciplinary discharge planning process, led by the attending physician.  Recommendations may be updated based on patient status, additional functional criteria and insurance authorization.    Follow Up Recommendations  Skilled nursing-short term rehab (<3 hours/day)    Assistance Recommended at Discharge Frequent or constant Supervision/Assistance  Patient can return home with the following  A lot of help with walking and/or transfers;A lot of help with bathing/dressing/bathroom;Assistance with cooking/housework;Direct supervision/assist for  medications management;Direct supervision/assist for financial management;Assist for transportation   Equipment Recommendations  BSC/3in1;Tub/shower seat    Recommendations for Other Services      Precautions / Restrictions Precautions Precautions: Back;Fall Restrictions Other Position/Activity Restrictions: Oncology note indicates that pt sees Dr. Lacinda Axon OP for spine and has been managed with a brace. Upon chart review in Care Everywhere, under plan from Dr. Remo Lipps Cook's note from 11/12/20 indicates "brace is prescribed for stability for ambulation, aid in healing and pain control". Pt's daughter who is present in room is agreeable to bringing in brace on 05/03/21. Per Dr. Grandville Silos - try to mobilize pt with brace on, if she cannot tolerate it, it's okay to mobilize her without brace (via secure chat) 05/03/21.       Mobility Bed Mobility Overal bed mobility: Needs Assistance Bed Mobility: Sit to Supine       Sit to supine: Max assist, HOB elevated   General bed mobility comments: attempted to log roll back to bed, but pt unable to sequence with cues, ultimately requires MAX A for trunk/LEs back to bed with sit to sup (HOB ~30 degrees)    Transfers Overall transfer level: Needs assistance Equipment used: Rolling walker (2 wheels)         Step pivot transfers: Mod assist, Max assist     General transfer comment: able to CTS from chair with MOD A, but requires MOD/MAX A for actual step-pivot from chair to bed seemingly d/t confusion wiht sequencing use of RW in addition to weakness.     Balance Overall balance assessment: Needs assistance   Sitting balance-Leahy Scale: Fair       Standing balance-Leahy Scale:  Poor                             ADL either performed or assessed with clinical judgement   ADL Overall ADL's : Needs assistance/impaired                     Lower Body Dressing: Maximal assistance Lower Body Dressing Details (indicate cue  type and reason): socks             Functional mobility during ADLs: Moderate assistance;Rolling walker (2 wheels) (for 3-4 small shuffle steps from chair to bed, moderate cues to sequence, benefits from tactile cues more than verbal)      Extremity/Trunk Assessment Upper Extremity Assessment Upper Extremity Assessment: Generalized weakness   Lower Extremity Assessment Lower Extremity Assessment: Generalized weakness   Cervical / Trunk Assessment Cervical / Trunk Assessment: Kyphotic    Vision Baseline Vision/History: 1 Wears glasses Patient Visual Report: No change from baseline     Perception     Praxis      Cognition Arousal/Alertness: Awake/alert Behavior During Therapy: WFL for tasks assessed/performed Overall Cognitive Status: Impaired/Different from baseline Area of Impairment: Following commands, Orientation, Memory, Safety/judgement, Problem solving, Attention, Awareness                 Orientation Level: Disoriented to, Situation   Memory: Decreased short-term memory Following Commands: Follows one step commands with increased time Safety/Judgement: Decreased awareness of deficits Awareness: Anticipatory Problem Solving: Slow processing, Requires verbal cues, Requires tactile cues General Comments: Pt is pleasant, seems to be less confused than chart denotes her from yesterday but daughter reports pt is still not clear. Pt not oriented to situation. Some difficulty sequencing use of walker with cues        Exercises Other Exercises Other Exercises: OT engages pt in transfer with ed with pt and family re: safe and correct use of RW.    Shoulder Instructions       General Comments Pt with continent void on BSC.    Pertinent Vitals/ Pain       Pain Assessment Pain Assessment: Faces Faces Pain Scale: Hurts even more Pain Location: back Pain Descriptors / Indicators: Grimacing, Discomfort Pain Intervention(s): Limited activity within patient's  tolerance, Monitored during session, Repositioned, Heat applied  Home Living Family/patient expects to be discharged to:: Private residence Living Arrangements: Spouse/significant other Available Help at Discharge: Family;Available PRN/intermittently Type of Home: House       Home Layout: One level               Home Equipment: Conservation officer, nature (2 wheels)          Prior Functioning/Environment              Frequency  Min 2X/week        Progress Toward Goals  OT Goals(current goals can now be found in the care plan section)  Progress towards OT goals: Progressing toward goals  Acute Rehab OT Goals Patient Stated Goal: to get better OT Goal Formulation: With patient/family Time For Goal Achievement: 05/16/21 Potential to Achieve Goals: L[place] Discharge plan remains appropriate    Co-evaluation                 AM-PAC OT "6 Clicks" Daily Activity     Outcome Measure   Help from another person eating meals?: None Help from another person taking care of personal grooming?: A Little  Help from another person toileting, which includes using toliet, bedpan, or urinal?: A Lot Help from another person bathing (including washing, rinsing, drying)?: A Lot Help from another person to put on and taking off regular upper body clothing?: A Little Help from another person to put on and taking off regular lower body clothing?: A Lot 6 Click Score: 16    End of Session Equipment Utilized During Treatment: Rolling walker (2 wheels)  OT Visit Diagnosis: Unsteadiness on feet (R26.81);Other abnormalities of gait and mobility (R26.89);Muscle weakness (generalized) (M62.81);Pain   Activity Tolerance Patient limited by pain   Patient Left in bed;with call bell/phone within reach;with bed alarm set;with family/visitor present   Nurse Communication Mobility status        Time: 9694-0982 OT Time Calculation (min): 36 min  Charges: OT General Charges $OT  Visit: 1 Visit OT Treatments $Self Care/Home Management : 8-22 mins $Therapeutic Activity: 8-22 mins  Gerrianne Scale, [place], OTR/L ascom [phone] 05/03/21, 4:29 PM

## 2021-05-03 NOTE — Progress Notes (Signed)
*  PRELIMINARY RESULTS* Echocardiogram 2D Echocardiogram has been performed.  Cristina Morgan Namita Yearwood 05/03/2021, 2:22 PM

## 2021-05-03 NOTE — Evaluation (Signed)
Physical Therapy Evaluation Patient Details Name: Cristina Morgan MRN: [id number] DOB: 02-14-1935 Today's Date: 05/03/2021  History of Present Illness  Pt is an 87 y/o F with PMH: DM and HTN who was admitted d/t mental status changes. She was found to have hypercalcemia and incidentally on CT scan, lytic lesions to the bone per oncology note. Metastatic bone survey reports: "Innumerable small lucent lesions throughout the skeleton and moth  eaten appearance to the ribs and spine, compatible with multiple  myeloma." Pt being seen by Dr. Lacinda Axon with neurosurgery OP And is being managed conservatively.  Clinical Impression  Pt seen for PT evaluation with daughter present for session. Prior to admission pt was ambulatory with RW with family providing assistance PRN. On this date, pt is AxOx3 but does have difficulty following simple commands for log rolling for bed mobility & requires multimodal cuing for hand placement during transfers. Pt is able to complete sit>stand from EOB with max assist & BSC with max assist +2. Pt demonstrates R lateral lean in standing with PT facilitating weight shift to L to allow increased ease of stepping with RLE. Pt assisted with step pivot transfer BSC>recliner with pt requiring extra time & cuing for technique. Will continue to follow pt acutely to progress mobility as able. Pt would benefit from STR upon d/c to maximize independence with functional mobility, reduce fall risk, & decrease caregiver burden prior to return home.      Recommendations for follow up therapy are one component of a multi-disciplinary discharge planning process, led by the attending physician.  Recommendations may be updated based on patient status, additional functional criteria and insurance authorization.  Follow Up Recommendations Skilled nursing-short term rehab (<3 hours/day)    Assistance Recommended at Discharge Frequent or constant Supervision/Assistance  Patient can return home with the  following  Two people to help with walking and/or transfers;Two people to help with bathing/dressing/bathroom;Help with stairs or ramp for entrance;Assist for transportation;Direct supervision/assist for financial management;Assistance with cooking/housework    Equipment Recommendations BSC/3in1  Recommendations for Other Services       Functional Status Assessment Patient has had a recent decline in their functional status and demonstrates the ability to make significant improvements in function in a reasonable and predictable amount of time.     Precautions / Restrictions Precautions Precautions: Back;Fall Restrictions Other Position/Activity Restrictions: Oncology note indicates that pt sees Dr. Lacinda Axon OP for spine and has been managed with a brace. Dr. Grandville Silos messaged, but does not provide parameters for use of brace. Upon chart review in Care Everywhere, under plan from Dr. Remo Lipps Cook's note from 11/12/20 indicates "brace is prescribed for stability for ambulation, aid in healing and pain control". Pt's daughter who is present in room is agreeable to bringing in brace on 05/03/21. Per Dr. Grandville Silos - try to mobilize pt with brace on, if she cannot tolerate it, it's okay to mobilize her without brace (via secure chat) 05/03/21.      Mobility  Bed Mobility Overal bed mobility: Needs Assistance Bed Mobility: Rolling, Sidelying to Sit, Supine to Sit Rolling: Max assist Sidelying to sit: Max assist, HOB elevated Supine to sit: Max assist, HOB elevated     General bed mobility comments: PT attempts to cue pt for log rolling but pt with difficulty & requires max assist for supine>sit with HOB elevated & bed rails.    Transfers Overall transfer level: Needs assistance Equipment used: Rolling walker (2 wheels) Transfers: Sit to/from Stand, Bed to chair/wheelchair/BSC Sit to  Stand: Max assist, From elevated surface (cuing for safe hand placement, STS from elevated EOB & BSC with B  armrests)   Step pivot transfers: Mod assist, Max assist, +2 safety/equipment (pt completes step pivot BSC>recliner with +2 assist for safety with cuing for stepping & turning. Pt requires assistance to fully pivot to sit in seat.)            Ambulation/Gait                  Stairs            Wheelchair Mobility    Modified Rankin (Stroke Patients Only)       Balance Overall balance assessment: Needs assistance Sitting-balance support: Feet supported Sitting balance-Leahy Scale: Fair Sitting balance - Comments: static sitting on BSC with back support without LOB     Standing balance-Leahy Scale: Poor                               Pertinent Vitals/Pain Pain Assessment Pain Assessment: Faces Faces Pain Scale: Hurts even more Pain Location: back Pain Descriptors / Indicators: Grimacing, Discomfort Pain Intervention(s): Monitored during session, Repositioned, Limited activity within patient's tolerance    Home Living Family/patient expects to be discharged to:: Private residence Living Arrangements: Spouse/significant other Available Help at Discharge: Family;Available PRN/intermittently Type of Home: House         Home Layout: One level Home Equipment: Conservation officer, nature (2 wheels)      Prior Function Prior Level of Function : Independent/Modified Independent             Mobility Comments: typically MOD I With RW for fxl mobility, has been needing transfer assist from family members in past week or so. ADLs Comments: typically INDEP for ADLs. Has been requiring increased assist in recent weeks. Admitedly limited with lower body task since her low back pain has been going on since summer of 2022.     Hand Dominance        Extremity/Trunk Assessment   Upper Extremity Assessment Upper Extremity Assessment: Generalized weakness    Lower Extremity Assessment Lower Extremity Assessment: Generalized weakness    Cervical / Trunk  Assessment Cervical / Trunk Assessment: Kyphotic  Communication   Communication: No difficulties  Cognition Arousal/Alertness: Awake/alert   Overall Cognitive Status: Impaired/Different from baseline Area of Impairment: Following commands, Orientation, Memory, Safety/judgement, Problem solving, Attention, Awareness                 Orientation Level: Disoriented to, Situation     Following Commands: Follows one step commands with increased time Safety/Judgement: Decreased awareness of deficits Awareness: Anticipatory   General Comments: Pt is pleasant, seems to be less confused than chart denotes her from yesterday but daughter reports pt is still not clear. Pt not oriented to situation.        General Comments General comments (skin integrity, edema, etc.): Pt with continent void on BSC.    Exercises     Assessment/Plan    PT Assessment Patient needs continued PT services  PT Problem List Decreased strength;Decreased mobility;Decreased safety awareness;Decreased activity tolerance;Decreased balance;Decreased knowledge of use of DME;Pain;Decreased cognition;Cardiopulmonary status limiting activity;Decreased knowledge of precautions;Decreased coordination       PT Treatment Interventions DME instruction;Therapeutic exercise;Balance training;Gait training;Stair training;Neuromuscular re-education;Functional mobility training;Therapeutic activities;Patient/family education;Cognitive remediation;Modalities;Manual techniques    PT Goals (Current goals can be found in the Care Plan section)  Acute Rehab PT Goals Patient  Stated Goal: get better PT Goal Formulation: With patient/family Time For Goal Achievement: 05/17/21 Potential to Achieve Goals: Fair    Frequency Min 2X/week     Co-evaluation               AM-PAC PT "6 Clicks" Mobility  Outcome Measure Help needed turning from your back to your side while in a flat bed without using bedrails?: Total Help  needed moving from lying on your back to sitting on the side of a flat bed without using bedrails?: Total Help needed moving to and from a bed to a chair (including a wheelchair)?: Total Help needed standing up from a chair using your arms (e.g., wheelchair or bedside chair)?: Total Help needed to walk in hospital room?: Total Help needed climbing 3-5 steps with a railing? : Total 6 Click Score: 6    End of Session Equipment Utilized During Treatment: Gait belt Activity Tolerance: Patient tolerated treatment well Patient left: in chair;with chair alarm set;with family/visitor present;with nursing/sitter in room Nurse Communication: Mobility status PT Visit Diagnosis: Unsteadiness on feet (R26.81);Muscle weakness (generalized) (M62.81);Difficulty in walking, not elsewhere classified (R26.2)    Time: 4481-8563 PT Time Calculation (min) (ACUTE ONLY): 25 min   Charges:   PT Evaluation $PT Eval Moderate Complexity: 1 Mod PT Treatments $Therapeutic Activity: 8-22 mins        Lavone Nian, PT, DPT 05/03/21, 1:11 PM   Waunita Schooner 05/03/2021, 1:08 PM

## 2021-05-03 NOTE — Consult Note (Signed)
Chief Complaint: Patient was seen in consultation today for hypercalcemia, multiple lytic bone lesions  Referring Physician(s): Charlaine Dalton, MD  Supervising Physician: Juliet Rude  Patient Status: [place] - In-pt  History of Present Illness: Cristina Morgan is a 87 y.o. female with a past medical history significant for HTN, HLD, DM who presented to [facility] ED on 2/15 due to AMS, lethargy, weakness, n/v and poor oral intake. Initial evaluation notable for hypercalcemia with normal renal function as well as innumerable cortical lucencies throughout the ribs, thoracic spine and sternum concerning for malignancy. Per hospitalist notes she was also recently found to have a complex left ovarian cyst suspected to be malignant and is pending further workup. She was admitted and oncology was consulted, due to concern for underlying malignancy IR has been consulted for bone marrow biopsy to further direct care.  Patient seen in her room, several family members at bedside - her daughter is present and states they are aware of the plan for bone marrow on Monday and they wish to move forward. Per daughter her mental status has improved greatly in the last few days and she is aware of where she is and what we plan on doing. Cristina Morgan is somnolent but able to answer questions and states agreement to having the biopsy done.  Past Medical History:  Diagnosis Date   Diabetes mellitus without complication ([place])    Hyperlipidemia    Hypertension     Past Surgical History:  Procedure Laterality Date   CATARACT EXTRACTION Bilateral 2014   COLONOSCOPY  2011   cleared for 5 yrs- [facility]   ECTOPIC PREGNANCY SURGERY      Allergies: Latex and Penicillins  Medications: Prior to Admission medications   Medication Sig Start Date End Date Taking? Authorizing Provider  acetaminophen (TYLENOL) 325 MG tablet Take 650 mg by mouth every 6 (six) hours as needed.   Yes [provider]  aspirin 81  MG chewable tablet Chew 1 tablet (81 mg total) by mouth daily. 07/06/18  Yes Juline Patch, MD  baclofen (LIORESAL) 20 MG tablet TAKE ONE TABLET BY MOUTH THREE times daily 02/21/21  Yes Juline Patch, MD  Calcium Carbonate-Vit D-Min (CALCIUM 1200 PO) Take 1 capsule by mouth daily.   Yes [provider]  cholecalciferol (VITAMIN D3) 25 MCG (1000 UNIT) tablet Take 1,000 Units by mouth daily.   Yes [provider]  glipiZIDE (GLUCOTROL XL) 10 MG 24 hr tablet Take 10 mg by mouth daily. 02/20/21  Yes [provider]  hydrOXYzine (ATARAX) 10 MG tablet Take 1 tablet (10 mg total) by mouth at bedtime as needed. 04/15/21  Yes Juline Patch, MD  lisinopril (ZESTRIL) 10 MG tablet Take 10 mg by mouth daily.   Yes [provider]  losartan (COZAAR) 25 MG tablet Take 1 tablet by mouth daily. 04/19/21  Yes [provider]  lovastatin (MEVACOR) 40 MG tablet Take 1 tablet (40 mg total) by mouth every evening. 04/15/21  Yes Juline Patch, MD  meloxicam (MOBIC) 15 MG tablet Take 1 tablet (15 mg total) by mouth daily. 11/16/20  Yes Juline Patch, MD  metFORMIN (GLUCOPHAGE) 1000 MG tablet TAKE ONE TABLET BY MOUTH TWICE DAILY 02/20/21  Yes Juline Patch, MD  Multiple Vitamin (MULTIVITAMIN ADULT PO) Take by mouth.   Yes [provider]  Omega-3 Fatty Acids (FISH OIL) 1000 MG CAPS Take 1 capsule (1,000 mg total) by mouth daily. 04/15/21  Yes Otilio Miu  C, MD  pantoprazole (PROTONIX) 40 MG tablet Take 1 tablet (40 mg total) by mouth daily. 04/15/21  Yes Juline Patch, MD  sertraline (ZOLOFT) 25 MG tablet Take 1 tablet (25 mg total) by mouth daily. 04/15/21  Yes Juline Patch, MD  alendronate (FOSAMAX) 70 MG tablet Take 1 tablet (70 mg total) by mouth every 7 (seven) days. Take with a full glass of water on an empty stomach. 04/15/21   Juline Patch, MD  losartan (COZAAR) 50 MG tablet Take 1 tablet (50 mg total) by mouth daily. Patient not taking: Reported on  05/01/2021 04/15/21   Juline Patch, MD     Family History  Problem Relation Age of Onset   Cancer Mother        breast   Heart disease Mother    Diabetes Father    Heart disease Father    Heart attack Father    Heart attack Brother    Diabetes Brother     Social History   Socioeconomic History   Marital status: Married    Spouse name: Not on file   Number of children: 2   Years of education: Not on file   Highest education level: 12th grade  Occupational History   Occupation: Retired  Tobacco Use   Smoking status: Never   Smokeless tobacco: Never   Tobacco comments:    smoking cessation materials not required  Vaping Use   Vaping Use: Never used  Substance and Sexual Activity   Alcohol use: No    Alcohol/week: 0.0 standard drinks   Drug use: No   Sexual activity: Never  Other Topics Concern   Not on file  Social History Narrative   [place] with husband; drives. Never smoked; no alcohol. Daughters live close.    Social Determinants of Health   Financial Resource Strain: Low Risk    Difficulty of Paying Living Expenses: Not hard at all  Food Insecurity: No Food Insecurity   Worried About Charity fundraiser in the Last Year: Never true   [place] in the Last Year: Never true  Transportation Needs: No Transportation Needs   Lack of Transportation (Medical): No   Lack of Transportation (Non-Medical): No  Physical Activity: Inactive   Days of Exercise per Week: 0 days   Minutes of Exercise per Session: 0 min  Stress: No Stress Concern Present   Feeling of Stress : Not at all  Social Connections: Moderately Integrated   Frequency of Communication with Friends and Family: More than three times a week   Frequency of Social Gatherings with Friends and Family: More than three times a week   Attends Religious Services: More than 4 times per year   Active Member of [employer] or Organizations: No   Attends Archivist Meetings: Never   Marital  Status: Married     Review of Systems: A 12 point ROS discussed and pertinent positives are indicated in the HPI above.  All other systems are negative.  Review of Systems  Constitutional:  Positive for fatigue.       Limited ROS, patient somnolent during exam.  Respiratory:  Negative for cough and shortness of breath.   Cardiovascular:  Positive for chest pain.  Gastrointestinal:  Negative for nausea and vomiting.  Musculoskeletal:  Positive for arthralgias (hips, back).  Neurological:  Negative for headaches.   Vital Signs: BP (!) 161/80 (BP Location: Right Arm)    Pulse (!) 106  Temp 98.1 F (36.7 C)    Resp 14    Ht '5\' 7"'  (1.702 m)    Wt 134 lb 0.6 oz (60.8 kg)    SpO2 96%    BMI 20.99 kg/m   Physical Exam Vitals and nursing note reviewed.  Constitutional:      General: She is not in acute distress.    Comments: Cachectic appearing, alert but somnolent during exam.  HENT:     Head: Normocephalic.     Mouth/Throat:     Mouth: Mucous membranes are moist.     Pharynx: Oropharynx is clear. No oropharyngeal exudate or posterior oropharyngeal erythema.  Cardiovascular:     Rate and Rhythm: Regular rhythm. Tachycardia present.  Pulmonary:     Effort: Pulmonary effort is normal.     Breath sounds: Normal breath sounds.  Abdominal:     Palpations: Abdomen is soft.  Skin:    General: Skin is warm and dry.  Neurological:     Mental Status: She is alert.  Oriented to place, situation, month   MD Evaluation Airway: WNL Heart: WNL Abdomen: WNL Chest/ Lungs: WNL (chest pain/soreness) ASA  Classification: 2 Mallampati/Airway Score: Two   Imaging: CT HEAD WO CONTRAST (5MM)  Result Date: 05/01/2021 CLINICAL DATA:  Mental status change, unknown cause EXAM: CT HEAD WITHOUT CONTRAST TECHNIQUE: Contiguous axial images were obtained from the base of the skull through the vertex without intravenous contrast. RADIATION DOSE REDUCTION: This exam was performed according to the  departmental dose-optimization program which includes automated exposure control, adjustment of the mA and/or kV according to patient size and/or use of iterative reconstruction technique. COMPARISON:  None. FINDINGS: Brain: No acute intracranial abnormality. Specifically, no hemorrhage, hydrocephalus, mass lesion, acute infarction, or significant intracranial injury. Vascular: No hyperdense vessel or unexpected calcification. Skull: No acute calvarial abnormality. Sinuses/Orbits: No acute findings Other: None IMPRESSION: No acute intracranial abnormality. Electronically Signed   By: Rolm Baptise M.D.   On: 05/01/2021 03:39   CT Chest Wo Contrast  Result Date: 05/01/2021 CLINICAL DATA:  Evaluate for pneumonia. EXAM: CT CHEST WITHOUT CONTRAST TECHNIQUE: Multidetector CT imaging of the chest was performed following the standard protocol without IV contrast. RADIATION DOSE REDUCTION: This exam was performed according to the departmental dose-optimization program which includes automated exposure control, adjustment of the mA and/or kV according to patient size and/or use of iterative reconstruction technique. COMPARISON:  None. FINDINGS: Cardiovascular: Mild cardiac enlargement. No pericardial effusion. Aortic atherosclerosis and coronary artery atherosclerotic calcifications. Increase caliber of the main pulmonary artery compatible with PA hypertension. Mediastinum/Nodes: No enlarged mediastinal or axillary lymph nodes. Thyroid gland, trachea, and esophagus demonstrate no significant findings. Lungs/Pleura: No pleural effusion. No airspace consolidation. Areas of subsegmental atelectasis noted within both lower lung zones and lingula. 4 mm perifissural nodule in the right middle lobe is identified, image 61/3. No suspicious nodule or mass identified. Upper Abdomen: No acute findings within the imaged portions of the upper abdomen. Musculoskeletal: The bones are markedly osteopenic. There are innumerable cortical  lucencies throughout the ribs, thoracic spine and sternum concerning for metastatic disease., image 76/6. The thoracic vertebral body heights are well preserved. There are innumerable bilateral rib fractures. Some of these appear chronic or subacute in nature where as others are more acute in appearance IMPRESSION: 1. No evidence for pneumonia. 2. Bilateral areas of lower lung zone subsegmental atelectasis. No signs of pneumonia. 3. Innumerable cortical lucencies throughout the ribs, thoracic spine and sternum. Imaging findings are worrisome for diffuse osseous  metastasis, lesions of multiple myeloma or diffuse metabolic bone disorder. 4. Innumerable bilateral rib fractures. Some of these appear chronic or subacute in nature and others are more acute in appearance. 5. Increase caliber of the main pulmonary artery compatible with PA hypertension. 6. Coronary artery atherosclerotic calcifications. 7. 4 mm perifissural nodule in the right middle lobe is nonspecific. No follow-up needed if patient is low-risk. Non-contrast chest CT can be considered in 12 months if patient is high-risk. This recommendation follows the consensus statement: Guidelines for Management of Incidental Pulmonary Nodules Detected on CT Images: From the Fleischner Society 2017; Radiology 2017; 284:228-243. 8. Aortic Atherosclerosis (ICD10-I70.0). Electronically Signed   By: Kerby Moors M.D.   On: 05/01/2021 06:05   MR Pelvis W Wo Contrast  Result Date: 04/26/2021 CLINICAL DATA:  Cystic left adnexal lesion on recent ultrasound. Fibroids. Weight loss. EXAM: MRI PELVIS WITHOUT AND WITH CONTRAST TECHNIQUE: Multiplanar multisequence MR imaging of the pelvis was performed both before and after administration of intravenous contrast. CONTRAST:  30m GADAVIST GADOBUTROL 1 MMOL/ML IV SOLN COMPARISON:  Ultrasound on 02/21/2021 FINDINGS: Lower Urinary Tract: Incompletely distended urinary bladder, but otherwise unremarkable. Bowel: Unremarkable pelvic  bowel loops. Vascular/Lymphatic: Unremarkable. No pathologically enlarged pelvic lymph nodes identified. Reproductive: -- Uterus: Measures 4.5 x 2.8 by 3.9 cm (volume = 26 cm^3). A few small uterine fibroids are seen, largest measuring 1.4 cm in maximum diameter. No abnormal endometrial thickening seen. Cervix and vagina are unremarkable. -- Right ovary: A 1.5 cm simple cyst is seen in the right ovary. No other cystic or solid masses are identified. -- Left ovary: A complex cystic lesion is seen in the left ovary which measures 3.6 x 3.0 by 2.3 cm. This contains several thin internal septations, and a 8 mm solid mural nodule with probable mild contrast enhancement. This is consistent with a cystic ovarian neoplasm. Other: No peritoneal thickening or abnormal free fluid. A loculated fluid collection is noted within the left inguinal canal which measures 3.6 x 1.6 cm, consistent with a hydrocele of the canal of [place]uck. Musculoskeletal:  Unremarkable. IMPRESSION: 3.6 cm complex cystic lesion in the left ovary, with thin septations and small solid mural nodule, consistent with cystic ovarian neoplasm. Surgical evaluation should be considered. 1.5 cm benign-appearing cyst in the right ovary. A few small uterine fibroids, largest measuring 1.4 cm. Incidentally noted small left-sided hydrocele of the canal of Nuck. Electronically Signed   By: JMarlaine HindM.D.   On: 04/26/2021 18:44   DG Bone Density  Result Date: 04/08/2021 EXAM: DUAL X-RAY ABSORPTIOMETRY (DXA) FOR BONE MINERAL DENSITY IMPRESSION: crr Your patient FPreslyn Warrcompleted a BMD test on 04/08/2021 using the [place](analysis version: 14.10) manufactured by [employer] The following summarizes the results of our evaluation. PATIENT BIOGRAPHICAL: Name: BFrancina, BeeryPatient ID: [id number] Date: 003/25/1936Height: 62.5 in. Gender: Female Exam Date: 04/08/2021 Weight: 132.8 lbs. Indications: Advanced Age, arthritis,  diabetic, Early Menopause, Height Loss, History of Fracture (Adult), Low Body Weight, POSTmenopausal Fractures: Humerus, Spine Treatments: 81 MG ASPIRIN, calcium /vit d, glipizide, metformin, multivitamin, Vitamin D ASSESSMENT: The BMD measured at Femur Neck Left is 0.603 g/cm2 with a T-score of -3.1. This patient is considered osteoporotic according to [facility] criteria. Lumbar spine was not utilized due to advanced degenerative changes/scoliosis, etc.The scan quality is limited by exclusion of L-spine. Site Region Measured Measured WHO Young Adult BMD Date       Age  Classification T-score DualFemur Neck Left 04/08/2023 85.0 Osteoporosis -3.1 0.603 g/cm2 DualFemur Neck Left 09/24/2017 81.4 Osteopenia -1.5 0.829 g/cm2 DualFemur Total Mean 04/08/2021 85.0 Osteoporosis -2.9 0.643 g/cm2 DualFemur Total Mean 09/24/2017 81.4 Osteopenia -1.2 0.857 g/cm2 Left Forearm Radius 33% 04/08/2021 85.0 Osteopenia -1.8 0.723 g/cm2 Left Forearm Radius 33% 09/24/2017 81.4 Osteopenia -1.2 0.771 g/cm2 World Health Organization [employer]) criteria for post-menopausal, Caucasian Women: Normal:       T-score at or above -1 SD Osteopenia:   T-score between -1 and -2.5 SD Osteoporosis: T-score at or below -2.5 SD RECOMMENDATIONS: 1. All patients should optimize calcium and vitamin D intake. 2. Consider FDA-approved medical therapies in postmenopausal women and men aged 82 years and older, based on the following: a. A hip or vertebral (clinical or morphometric) fracture b. T-score < -2.5 at the femoral neck or spine after appropriate evaluation to exclude secondary causes c. Low bone mass (T-score between -1.0 and -2.5 at the femoral neck or spine) and a 10-year probability of a hip fracture > 3% or a 10-year probability of a major osteoporosis-related fracture > 20% based on the [place]-adapted WHO algorithm d. Clinician judgment and/or patient preferences may indicate treatment for people with 10-year fracture  probabilities above or below these levels FOLLOW-UP: People with diagnosed cases of osteoporosis or at high risk for fracture should have regular bone mineral density tests. For patients eligible for Medicare, routine testing is allowed once every 2 years. The testing frequency can be increased to one year for patients who have rapidly progressing disease, those who are receiving or discontinuing medical therapy to restore bone mass, or have additional risk factors. I have reviewed this report, and agree with the above findings. [facility] Electronically Signed   By: Elmer Picker M.D.   On: 04/08/2021 16:32   DG Chest Portable 1 View  Result Date: 05/01/2021 CLINICAL DATA:  malaise, poorly responsive EXAM: PORTABLE CHEST 1 VIEW COMPARISON:  02/18/2021 FINDINGS: Cardiomegaly, vascular congestion. No overt edema. Bibasilar atelectasis. No effusions or pneumothorax. No acute bony abnormality. IMPRESSION: Cardiomegaly, vascular congestion. Electronically Signed   By: Rolm Baptise M.D.   On: 05/01/2021 03:47   DG Bone Survey Met  Result Date: 05/02/2021 CLINICAL DATA:  Inpatient.  Hypercalcemia. EXAM: METASTATIC BONE SURVEY COMPARISON:  None. FINDINGS: Innumerable small lucent lesions throughout the skeleton, including the proximal and mid right humeral shaft, bilateral iliac bones, proximal metaphyses and shafts of the bilateral femora. Severe lower thoracic vertebral body compression fracture. Moderate L1 and severe L2 vertebral compression fractures. Moth eaten appearance to the ribs and spine. A few tiny lucent lesions overlying the calvarium. Acute or early subacute lateral left fourth and fifth rib fractures. Probable small calcified uterine fibroid overlying the sacrum to the right of midline. Mild to moderate bilateral knee osteoarthritis. IMPRESSION: 1. Innumerable small lucent lesions throughout the skeleton and moth eaten appearance to the ribs and spine, compatible with multiple  myeloma. 2. Moderate L1 and severe L2 vertebral compression fractures. Severe lower thoracic vertebral compression fracture. 3. Acute or early subacute lateral left fourth and fifth rib fractures. Electronically Signed   By: Ilona Sorrel M.D.   On: 05/02/2021 10:04    Labs:  CBC: Recent Labs    04/15/21 1549 05/01/21 0320 05/02/21 0609 05/03/21 0532  WBC 7.1 6.7 12.2* 8.5  HGB 9.5* 10.5* 10.5* 9.5*  HCT 29.8* 33.3* 32.9* 28.4*  PLT 291 314 288 247    COAGS: No results for input(s): INR, APTT in the last 8760  hours.  BMP: Recent Labs    02/01/21 1502 02/18/21 1615 05/01/21 0320 05/02/21 0609 05/02/21 0823 05/03/21 0532  NA 138  --  135 138  --  131*  K 4.3  --  3.7 3.8  --  3.5  CL 101  --  105 106  --  103  CO2 23  --  24 24  --  22  GLUCOSE 178*  --  253* 205*  --  178*  BUN 19  --  24* 14  --  15  CALCIUM 10.7*   < > 11.4* 10.8* 11.0* 9.1  CREATININE 0.84  --  0.85 0.79  --  0.63  GFRNONAA  --   --  >60 >60  --  >60   < > = values in this interval not displayed.    LIVER FUNCTION TESTS: Recent Labs    07/24/20 1104 02/01/21 1502 04/15/21 1549 05/01/21 0320 05/03/21 0532  BILITOT 0.5 0.3 0.3 0.7  --   AST '19 16 17 18  ' --   ALT '14 9 8 12  ' --   ALKPHOS 68 102 108 88  --   PROT 7.6  --   --  7.8  --   ALBUMIN 4.4 4.3 4.4 3.8 3.1*    TUMOR MARKERS: No results for input(s): AFPTM, CEA, CA199, CHROMGRNA in the last 8760 hours.  Assessment and Plan:  87 y/o F admitted 2/15 due to AMS, lethargy, weakness, n/v and poor PO intake found to have hypercalcemia in setting of normal renal function and multiple bony lytic lesions concerning for malignancy seen today for bone marrow aspiration/biopsy.  Plan for bone marrow biopsy AM of 2/20 - patient to be NPO at midnight on 2/20, CBC w/diff order for AM of 2/20, IR will call for patient when ready.  Risks and benefits of bone marrow aspiration/biopsy was discussed with the patient and/or patient's family including,  but not limited to bleeding, infection, damage to adjacent structures or low yield requiring additional tests.  All of the questions were answered and there is agreement to proceed.  Consent signed and in chart.  Thank you for this interesting consult.  I greatly enjoyed meeting TYLAN BRIGUGLIO and look forward to participating in their care.  A copy of this report was sent to the requesting provider on this date.  Electronically Signed: Joaquim Nam, PA-C 05/03/2021, 2:51 PM   I spent a total of 20 Minutes in face to face in clinical consultation, greater than 50% of which was counseling/coordinating care for bone marrow aspiration/biopsy.

## 2021-05-03 NOTE — Progress Notes (Signed)
Cristina Morgan   DOB:[birth year]   LD#:[id number]    Subjective: No acute events overnight.  Patient continues to intermittently confused; however more lucid as per the family.  Daughter was at bedside.  Objective:  Vitals:   05/03/21 1538 05/03/21 2056  BP: 126/66 (!) 90/46  Pulse: 77 70  Resp: 16 18  Temp: 98.3 F (36.8 C) 98.1 F (36.7 C)  SpO2: 100% 96%     Intake/Output Summary (Last 24 hours) at 05/03/2021 2307 Last data filed at 05/03/2021 1821 Gross per 24 hour  Intake 3033.83 ml  Output 450 ml  Net 2583.83 ml   Elderly cachectic appearing female patient.  Alert oriented x2.  Accompanied by daughter.  Physical Exam Vitals and nursing note reviewed.  HENT:     Head: Normocephalic and atraumatic.     Mouth/Throat:     Pharynx: Oropharynx is clear.  Eyes:     Extraocular Movements: Extraocular movements intact.     Pupils: Pupils are equal, round, and reactive to light.  Cardiovascular:     Rate and Rhythm: Normal rate and regular rhythm.  Pulmonary:     Comments: Decreased breath sounds bilaterally.  Abdominal:     Palpations: Abdomen is soft.  Musculoskeletal:        General: Normal range of motion.     Cervical back: Normal range of motion.  Skin:    General: Skin is warm.  Neurological:     General: No focal deficit present.     Mental Status: She is alert.     Labs:  Lab Results  Component Value Date   WBC 8.5 05/03/2021   HGB 9.5 (L) 05/03/2021   HCT 28.4 (L) 05/03/2021   MCV 94.4 05/03/2021   PLT 247 05/03/2021   NEUTROABS 7.4 05/03/2021    Lab Results  Component Value Date   NA 131 (L) 05/03/2021   K 3.5 05/03/2021   CL 103 05/03/2021   CO2 22 05/03/2021    Studies:  DG Bone Survey Met  Result Date: 05/02/2021 CLINICAL DATA:  Inpatient.  Hypercalcemia. EXAM: METASTATIC BONE SURVEY COMPARISON:  None. FINDINGS: Innumerable small lucent lesions throughout the skeleton, including the proximal and mid right humeral shaft, bilateral iliac  bones, proximal metaphyses and shafts of the bilateral femora. Severe lower thoracic vertebral body compression fracture. Moderate L1 and severe L2 vertebral compression fractures. Moth eaten appearance to the ribs and spine. A few tiny lucent lesions overlying the calvarium. Acute or early subacute lateral left fourth and fifth rib fractures. Probable small calcified uterine fibroid overlying the sacrum to the right of midline. Mild to moderate bilateral knee osteoarthritis. IMPRESSION: 1. Innumerable small lucent lesions throughout the skeleton and moth eaten appearance to the ribs and spine, compatible with multiple myeloma. 2. Moderate L1 and severe L2 vertebral compression fractures. Severe lower thoracic vertebral compression fracture. 3. Acute or early subacute lateral left fourth and fifth rib fractures. Electronically Signed   By: Ilona Sorrel M.D.   On: 05/02/2021 10:04   ECHOCARDIOGRAM COMPLETE  Result Date: 05/03/2021    ECHOCARDIOGRAM REPORT   Patient Name:   Cristina Morgan Date of Exam: 05/03/2021 Medical Rec #:  [id number]       Height:       67.0 in Accession #:    [id number]      Weight:       134.0 lb Date of Birth:  March 11, 1935       BSA:  1.706 m² Patient Age:    87 years        BP:           161/80 mmHg Patient Gender: F               HR:           80 bpm. Exam Location:  [facility] Procedure: 2D Echo, Color Doppler and Cardiac Doppler Indications:     Supraventricular tachycardia  History:         Patient has no prior history of Echocardiogram examinations.                  Risk Factors:Hypertension, Diabetes and Dyslipidemia.  Sonographer:     Joan Heiss Referring Phys:  [id number] DANIEL V THOMPSON Diagnosing Phys: Timothy Gollan MD  Sonographer Comments: Suboptimal subcostal window. IMPRESSIONS  1. Left ventricular ejection fraction, by estimation, is 60 to 65%. The left ventricle has normal function. The left ventricle has no regional wall motion abnormalities. Left ventricular diastolic  parameters are consistent with Grade I diastolic dysfunction (impaired relaxation).  2. Right ventricular systolic function is normal. The right ventricular size is normal.  3. The mitral valve is normal in structure. Mild to moderate mitral valve regurgitation. No evidence of mitral stenosis.  4. Tricuspid valve regurgitation is mild to moderate.  5. The aortic valve is normal in structure. Aortic valve regurgitation is mild. No aortic stenosis is present.  6. The inferior vena cava is normal in size with greater than 50% respiratory variability, suggesting right atrial pressure of 3 mmHg. FINDINGS  Left Ventricle: Left ventricular ejection fraction, by estimation, is 60 to 65%. The left ventricle has normal function. The left ventricle has no regional wall motion abnormalities. The left ventricular internal cavity size was normal in size. There is  no left ventricular hypertrophy. Left ventricular diastolic parameters are consistent with Grade I diastolic dysfunction (impaired relaxation). Right Ventricle: The right ventricular size is normal. No increase in right ventricular wall thickness. Right ventricular systolic function is normal. Left Atrium: Left atrial size was normal in size. Right Atrium: Right atrial size was normal in size. Pericardium: There is no evidence of pericardial effusion. Mitral Valve: The mitral valve is normal in structure. Mild to moderate mitral valve regurgitation. No evidence of mitral valve stenosis. MV peak gradient, 3.3 mmHg. The mean mitral valve gradient is 1.0 mmHg. Tricuspid Valve: The tricuspid valve is normal in structure. Tricuspid valve regurgitation is mild to moderate. No evidence of tricuspid stenosis. Aortic Valve: The aortic valve is normal in structure. Aortic valve regurgitation is mild. No aortic stenosis is present. Aortic valve mean gradient measures 4.0 mmHg. Aortic valve peak gradient measures 6.4 mmHg. Aortic valve area, by VTI measures 2.61 cm². Pulmonic  Valve: The pulmonic valve was normal in structure. Pulmonic valve regurgitation is mild. No evidence of pulmonic stenosis. Aorta: The aortic root is normal in size and structure. Venous: The inferior vena cava is normal in size with greater than 50% respiratory variability, suggesting right atrial pressure of 3 mmHg. IAS/Shunts: No atrial level shunt detected by color flow Doppler.  LEFT VENTRICLE PLAX 2D LVIDd:         4.06 cm   Diastology LVIDs:         2.78 cm   LV e' medial:    4.79 cm/s LV PW:         1.18 cm   LV E/e' medial:  14.9 LV IVS:          0.97 cm   LV e' lateral:   6.20 cm/s LVOT diam:     2.10 cm   LV E/e' lateral: 11.5 LV SV:         68 LV SV Index:   40 LVOT Area:     3.46 cm  RIGHT VENTRICLE RV Basal diam:  4.04 cm LEFT ATRIUM             Index        RIGHT ATRIUM           Index LA diam:        3.90 cm 2.29 cm/m   RA Area:     13.40 cm LA Vol (A2C):   34.9 ml 20.46 ml/m  RA Volume:   33.00 ml  19.34 ml/m LA Vol (A4C):   59.1 ml 34.64 ml/m LA Biplane Vol: 48.9 ml 28.66 ml/m  AORTIC VALVE                    PULMONIC VALVE AV Area (Vmax):    2.49 cm     PV Vmax:          0.91 m/s AV Area (Vmean):   2.54 cm     PV Vmean:         68.700 cm/s AV Area (VTI):     2.61 cm     PV VTI:           0.177 m AV Vmax:           126.00 cm/s  PV Peak grad:     3.3 mmHg AV Vmean:          92.400 cm/s  PV Mean grad:     2.0 mmHg AV VTI:            0.259 m      PR End Diast Vel: 3.98 msec AV Peak Grad:      6.4 mmHg AV Mean Grad:      4.0 mmHg LVOT Vmax:         90.70 cm/s LVOT Vmean:        67.700 cm/s LVOT VTI:          0.195 m LVOT/AV VTI ratio: 0.75  AORTA Ao Root diam: 3.00 cm MITRAL VALVE               TRICUSPID VALVE MV Area (PHT): 4.52 cm    TR Peak grad:   26.0 mmHg MV Area VTI:   3.55 cm    TR Vmax:        255.00 cm/s MV Peak grad:  3.3 mmHg MV Mean grad:  1.0 mmHg    SHUNTS MV Vmax:       0.90 m/s    Systemic VTI:  0.20 m MV Vmean:      58.1 cm/s   Systemic Diam: 2.10 cm MV Decel Time: 168 msec  MV E velocity: 71.35 cm/s MV A velocity: 74.80 cm/s MV E/A ratio:  0.95 Ida Rogue MD Electronically signed by Ida Rogue MD Signature Date/Time: 05/03/2021/4:08:02 PM    Final      #87 year old female patient with no prior history of malignancy-is currently admitted to hospital for mental status changes/hypercalcemia calcium 11.1; incidentally on CT scan.  Patient noted to have multiple lytic lesions in the bone.    #Hypercalcemia-calcium 11.2 on admission.  CT chest multiple bone lesions-lytic in the bone; mild anemia; however normal renal function-concerning for underlying malignancy like multiple myeloma versus  others.  Metastatic bone survey is consistent with multiple lytic lesion concerning for multiple myeloma.  Multiple myeloma panel pending; kappa/lambda light chain ratio elevated 12.  calcium -normal.  Will discontinue calcitonin.  S/p Zometa on 2/15.  Discussed regarding a bone marrow biopsy for further evaluation. ° °#Mental status changes likely secondary to hypercalcemia; CT head noncontrast negative-significant improvement noted overall. ° °#Multiple thoracic fractures [followed by Dr.Cook at [facility]]  ° °#Electrolyte abnormalities-hypomagnesemia/hypophosphatemia-as per primary team.   ° °Discussed with Dr. Thompson.  Discussed with the patient's daughter by the bedside regarding bone marrow biopsy.  Ordered for 2/20.  ° ° °Govinda R Brahmanday, MD °05/03/2021  11:07 PM ° ° °

## 2021-05-03 NOTE — Progress Notes (Signed)
PROGRESS NOTE    Cristina Morgan  XBJ:[id number] DOB: 1934/12/18 DOA: 05/01/2021 PCP: Juline Patch, MD    Chief Complaint  Patient presents with   Fatigue    Brief Narrative:  Patient is a pleasant 87 year old female history of diabetes, hypertension, nontraumatic compression fractures brought to the ED due to altered mental status changes, increased lethargy, increased weakness, poor oral intake.  Patient noted to be hypercalcemic, CT chest with innumerable cortical lucencies throughout the ribs, thoracic spine, sternum worrisome for diffuse osseous metastases, lesions of multiple myeloma or diffuse metabolic bone disorder.  Patient placed on aggressive IV fluids, IV Lasix, calcitonin, Zometa.  Oncology consulted and multiple myeloma work-up underway.    Assessment & Plan:  Principal Problem:   Hypercalcemia Active Problems:   Acute metabolic encephalopathy   Type 2 diabetes mellitus (HCC)   Hypercholesterolemia   Essential hypertension   Pressure injury of skin   Vitamin B12 deficiency   Hypomagnesemia   Hypophosphatemia   SVT (supraventricular tachycardia) (HCC)    Assessment and Plan: * Hypercalcemia - Patient presenting with lethargy, acute metabolic encephalopathy, noted to be hypercalcemic with CT scan findings with innumerable cortical lucencies throughout the ribs, thoracic spine, sternum worrisome for diffuse osseous metastases versus lesions of multiple myeloma or diffuse metabolic bone disorder. -PTH pending -Magnesium level at 1.4 from 1.1, phosphorus level at 1.5 from 1.8. -Continue IV fluids and IV Lasix for the next 24 hours. -Calcitonin discontinued per oncology. -Status post Zometa. -Calcium levels improving with a corrected calcium of 9.82 today. -Replete electrolytes. -Bone skeletal survey with innumerable small lucent lesions throughout the skeleton and moth-eaten appearance to the ribs and spine compatible with multiple myeloma.  Moderate L1 and  severe L2 vertebral compression fractures.  Severe lower thoracic vertebral compression fracture.  Acute or early subacute lateral left fourth and fifth rib fractures, - multiple myeloma panel ordered and pending per oncology. -Per oncology patient will need a bone marrow biopsy which will be done earliest 05/06/2021. -Oncology following and appreciate input and recommendations.  Acute metabolic encephalopathy - Likely secondary to hypercalcemia in the setting of dehydration. -No signs or symptoms of infection. -CT head with no acute intracranial abnormalities. -Clinically improving daily however not at baseline.   -Replete electrolytes, continue treatment for hypercalcemia.   SVT (supraventricular tachycardia) ([place]) - Patient noted to have some runs of SVT in the 140s to the 160s (05/02/2021) per RN and noted on telemetry -Likely secondary to electrolyte abnormalities as magnesium was noted at 1.1 and currently at 1.4 with phosphorus at 1.8 and currently at 1.5.  -TSH within normal limits.   -2D echo pending.  -Replete electrolytes to keep magnesium > 2.  -Replete phosphorus. -Potassium at 3.5, K-Dur 40 milliequivalents p.o. x1. -Continue low-dose Coreg and increased to 6.25 mg twice daily.   -IV Lopressor as needed heart rate > 130  Hypophosphatemia- (present on admission) - PTH pending. -K-Phos 30 mmol IV x1. -Repeat labs in the AM.  Hypomagnesemia- (present on admission) - Magnesium at 1.4. -Phosphorus at 1.5. -PTH pending. -Magnesium sulfate 4 g IV x1. -Repeat labs in the morning.  Vitamin B12 deficiency- (present on admission) - Vitamin B12 1000 mcg subcutaneously daily x6 days, then weekly x1 month, then monthly.  Pressure injury of skin Pressure Injury 05/01/21 Coccyx Mid Stage 2 -  Partial thickness loss of dermis presenting as a shallow open injury with a red, pink wound bed without slough. small open area (Active)  05/01/21 1500  Location: Coccyx  Location  Orientation: Mid  Staging: Stage 2 -  Partial thickness loss of dermis presenting as a shallow open injury with a red, pink wound bed without slough.  Wound Description (Comments): small open area  Present on Admission: Yes       Essential hypertension- (present on admission) - Continue Cozaar, Coreg.    Hypercholesterolemia- (present on admission) - Continue statin.  Type 2 diabetes mellitus (HCC) - Hemoglobin A1c 5.9 (02/01/2021) -CBG 179 this morning. -Sliding scale insulin         DVT prophylaxis: Lovenox Code Status: Full  Family Communication: Updated husband and daughter at bedside.   Disposition: TBD, likely home with home health per family preference.  Status is: Inpatient Remains inpatient appropriate because: Severity of illness           Consultants:  Oncology: Dr. Rogue Bussing 05/01/2021  Procedures:  CT chest 05/01/2021 CT head 05/01/2021 Bone survey 05/02/2021 2D echo pending  Antimicrobials:  None   Subjective: Laying in bed.  More alert.  Oriented to self place and time.  Complain of diffuse chest wall pain.  No shortness of breath.  No abdominal pain.  Daughter at bedside who feels mental status has improved overall since admission however not at baseline.    Objective: Vitals:   05/03/21 0426 05/03/21 0500 05/03/21 0837 05/03/21 1538  BP:   (!) 161/80 126/66  Pulse:   (!) 106 77  Resp:   14 16  Temp:   98.1 F (36.7 C) 98.3 F (36.8 C)  TempSrc:      SpO2:   96% 100%  Weight: 60.8 kg 60.8 kg    Height:        Intake/Output Summary (Last 24 hours) at 05/03/2021 1621 Last data filed at 05/03/2021 1525 Gross per 24 hour  Intake 3190.01 ml  Output 450 ml  Net 2740.01 ml   Filed Weights   05/01/21 0305 05/03/21 0426 05/03/21 0500  Weight: 55 kg 60.8 kg 60.8 kg    Examination:  General exam: NAD. Respiratory system: CTA B anterior lung fields.  No wheezes, no crackles, no rhonchi.  Normal respiratory effort.  Speaking in  full sentences.  Cardiovascular system: Regular rate and rhythm no murmurs rubs or gallops.  No JVD.  No lower extremity edema.  Gastrointestinal system: Abdomen is soft, nontender, nondistended, positive bowel sounds.  No rebound.  No guarding.  Central nervous system: Alert.  Less confused.  Moving extremities spontaneously.  No focal neurological deficits.  Extremities: Symmetric 5 x 5 power. Skin: No rashes, lesions or ulcers Psychiatry: Judgement and insight appear normal. Mood & affect appropriate.     Data Reviewed:   CBC: Recent Labs  Lab 05/01/21 0320 05/02/21 0609 05/03/21 0532  WBC 6.7 12.2* 8.5  NEUTROABS 4.3  --  7.4  HGB 10.5* 10.5* 9.5*  HCT 33.3* 32.9* 28.4*  MCV 99.1 98.5 94.4  PLT 314 288 462    Basic Metabolic Panel: Recent Labs  Lab 05/01/21 0320 05/02/21 0609 05/02/21 0823 05/03/21 0532  NA 135 138  --  131*  K 3.7 3.8  --  3.5  CL 105 106  --  103  CO2 24 24  --  22  GLUCOSE 253* 205*  --  178*  BUN 24* 14  --  15  CREATININE 0.85 0.79  --  0.63  CALCIUM 11.4* 10.8* 11.0* 9.1  MG  --  1.1*  --  1.4*  PHOS  --  1.8*  --  1.5*  GFR: Estimated Creatinine Clearance: 49.3 mL/min (by C-G formula based on SCr of 0.63 mg/dL).  Liver Function Tests: Recent Labs  Lab 05/01/21 0320 05/03/21 0532  AST 18  --   ALT 12  --   ALKPHOS 88  --   BILITOT 0.7  --   PROT 7.8  --   ALBUMIN 3.8 3.1*    CBG: Recent Labs  Lab 05/02/21 2005 05/03/21 0045 05/03/21 0414 05/03/21 0838 05/03/21 1157  GLUCAP 313* 106* 158* 179* 237*     Recent Results (from the past 240 hour(s))  Resp Panel by RT-PCR (Flu A&B, Covid) Nasopharyngeal Swab     Status: None   Collection Time: 05/01/21  3:50 AM   Specimen: Nasopharyngeal Swab; Nasopharyngeal(NP) swabs in vial transport medium  Result Value Ref Range Status   SARS Coronavirus 2 by RT PCR NEGATIVE NEGATIVE Final    Comment: (NOTE) SARS-CoV-2 target nucleic acids are NOT DETECTED.  The SARS-CoV-2 RNA  is generally detectable in upper respiratory specimens during the acute phase of infection. The lowest concentration of SARS-CoV-2 viral copies this assay can detect is 138 copies/mL. A negative result does not preclude SARS-Cov-2 infection and should not be used as the sole basis for treatment or other patient management decisions. A negative result may occur with  improper specimen collection/handling, submission of specimen other than nasopharyngeal swab, presence of viral mutation(s) within the areas targeted by this assay, and inadequate number of viral copies(<138 copies/mL). A negative result must be combined with clinical observations, patient history, and epidemiological information. The expected result is Negative.  Fact Sheet for Patients:  EntrepreneurPulse.com.au  Fact Sheet for Healthcare Providers:  IncredibleEmployment.be  This test is no t yet approved or cleared by the [place] FDA and  has been authorized for detection and/or diagnosis of SARS-CoV-2 by FDA under an Emergency Use Authorization (EUA). This EUA will remain  in effect (meaning this test can be used) for the duration of the COVID-19 declaration under Section 564(b)(1) of the Act, 21 U.S.C.section 360bbb-3(b)(1), unless the authorization is terminated  or revoked sooner.       Influenza A by PCR NEGATIVE NEGATIVE Final   Influenza B by PCR NEGATIVE NEGATIVE Final    Comment: (NOTE) The Xpert Xpress SARS-CoV-2/FLU/RSV plus assay is intended as an aid in the diagnosis of influenza from Nasopharyngeal swab specimens and should not be used as a sole basis for treatment. Nasal washings and aspirates are unacceptable for Xpert Xpress SARS-CoV-2/FLU/RSV testing.  Fact Sheet for Patients: EntrepreneurPulse.com.au  Fact Sheet for Healthcare Providers: IncredibleEmployment.be  This test is not yet approved or cleared by the  [place] FDA and has been authorized for detection and/or diagnosis of SARS-CoV-2 by FDA under an Emergency Use Authorization (EUA). This EUA will remain in effect (meaning this test can be used) for the duration of the COVID-19 declaration under Section 564(b)(1) of the Act, 21 U.S.C. section 360bbb-3(b)(1), unless the authorization is terminated or revoked.  Performed at [facility], [address] [place], [place] [address]          Radiology Studies: DG Bone Survey Met  Result Date: 05/02/2021 CLINICAL DATA:  Inpatient.  Hypercalcemia. EXAM: METASTATIC BONE SURVEY COMPARISON:  None. FINDINGS: Innumerable small lucent lesions throughout the skeleton, including the proximal and mid right humeral shaft, bilateral iliac bones, proximal metaphyses and shafts of the bilateral femora. Severe lower thoracic vertebral body compression fracture. Moderate L1 and severe L2 vertebral compression fractures. Moth eaten appearance to  the ribs and spine. A few tiny lucent lesions overlying the calvarium. Acute or early subacute lateral left fourth and fifth rib fractures. Probable small calcified uterine fibroid overlying the sacrum to the right of midline. Mild to moderate bilateral knee osteoarthritis. IMPRESSION: 1. Innumerable small lucent lesions throughout the skeleton and moth eaten appearance to the ribs and spine, compatible with multiple myeloma. 2. Moderate L1 and severe L2 vertebral compression fractures. Severe lower thoracic vertebral compression fracture. 3. Acute or early subacute lateral left fourth and fifth rib fractures. Electronically Signed   By: Ilona Sorrel M.D.   On: 05/02/2021 10:04   ECHOCARDIOGRAM COMPLETE  Result Date: 05/03/2021    ECHOCARDIOGRAM REPORT   Patient Name:   ROSENA BARTLE Date of Exam: 05/03/2021 Medical Rec #:  [id number]       Height:       67.0 in Accession #:    [id number]      Weight:       134.0 lb Date of Birth:  01/29/1935       BSA:           1.706 m Patient Age:    84 years        BP:           161/80 mmHg Patient Gender: F               HR:           80 bpm. Exam Location:  [facility] Procedure: 2D Echo, Color Doppler and Cardiac Doppler Indications:     Supraventricular tachycardia  History:         Patient has no prior history of Echocardiogram examinations.                  Risk Factors:Hypertension, Diabetes and Dyslipidemia.  Sonographer:     Charmayne Sheer Referring Phys:  [place] Diagnosing Phys: Ida Rogue MD  Sonographer Comments: Suboptimal subcostal window. IMPRESSIONS  1. Left ventricular ejection fraction, by estimation, is 60 to 65%. The left ventricle has normal function. The left ventricle has no regional wall motion abnormalities. Left ventricular diastolic parameters are consistent with Grade I diastolic dysfunction (impaired relaxation).  2. Right ventricular systolic function is normal. The right ventricular size is normal.  3. The mitral valve is normal in structure. Mild to moderate mitral valve regurgitation. No evidence of mitral stenosis.  4. Tricuspid valve regurgitation is mild to moderate.  5. The aortic valve is normal in structure. Aortic valve regurgitation is mild. No aortic stenosis is present.  6. The inferior vena cava is normal in size with greater than 50% respiratory variability, suggesting right atrial pressure of 3 mmHg. FINDINGS  Left Ventricle: Left ventricular ejection fraction, by estimation, is 60 to 65%. The left ventricle has normal function. The left ventricle has no regional wall motion abnormalities. The left ventricular internal cavity size was normal in size. There is  no left ventricular hypertrophy. Left ventricular diastolic parameters are consistent with Grade I diastolic dysfunction (impaired relaxation). Right Ventricle: The right ventricular size is normal. No increase in right ventricular wall thickness. Right ventricular systolic function is normal. Left Atrium: Left atrial size  was normal in size. Right Atrium: Right atrial size was normal in size. Pericardium: There is no evidence of pericardial effusion. Mitral Valve: The mitral valve is normal in structure. Mild to moderate mitral valve regurgitation. No evidence of mitral valve stenosis. MV peak gradient, 3.3 mmHg. The mean mitral valve  gradient is 1.0 mmHg. Tricuspid Valve: The tricuspid valve is normal in structure. Tricuspid valve regurgitation is mild to moderate. No evidence of tricuspid stenosis. Aortic Valve: The aortic valve is normal in structure. Aortic valve regurgitation is mild. No aortic stenosis is present. Aortic valve mean gradient measures 4.0 mmHg. Aortic valve peak gradient measures 6.4 mmHg. Aortic valve area, by VTI measures 2.61 cm. Pulmonic Valve: The pulmonic valve was normal in structure. Pulmonic valve regurgitation is mild. No evidence of pulmonic stenosis. Aorta: The aortic root is normal in size and structure. Venous: The inferior vena cava is normal in size with greater than 50% respiratory variability, suggesting right atrial pressure of 3 mmHg. IAS/Shunts: No atrial level shunt detected by color flow Doppler.  LEFT VENTRICLE PLAX 2D LVIDd:         4.06 cm   Diastology LVIDs:         2.78 cm   LV e' medial:    4.79 cm/s LV PW:         1.18 cm   LV E/e' medial:  14.9 LV IVS:        0.97 cm   LV e' lateral:   6.20 cm/s LVOT diam:     2.10 cm   LV E/e' lateral: 11.5 LV SV:         68 LV SV Index:   40 LVOT Area:     3.46 cm  RIGHT VENTRICLE RV Basal diam:  4.04 cm LEFT ATRIUM             Index        RIGHT ATRIUM           Index LA diam:        3.90 cm 2.29 cm/m   RA Area:     13.40 cm LA Vol (A2C):   34.9 ml 20.46 ml/m  RA Volume:   33.00 ml  19.34 ml/m LA Vol (A4C):   59.1 ml 34.64 ml/m LA Biplane Vol: 48.9 ml 28.66 ml/m  AORTIC VALVE                    PULMONIC VALVE AV Area (Vmax):    2.49 cm     PV Vmax:          0.91 m/s AV Area (Vmean):   2.54 cm     PV Vmean:         68.700 cm/s AV Area  (VTI):     2.61 cm     PV VTI:           0.177 m AV Vmax:           126.00 cm/s  PV Peak grad:     3.3 mmHg AV Vmean:          92.400 cm/s  PV Mean grad:     2.0 mmHg AV VTI:            0.259 m      PR End Diast Vel: 3.98 msec AV Peak Grad:      6.4 mmHg AV Mean Grad:      4.0 mmHg LVOT Vmax:         90.70 cm/s LVOT Vmean:        67.700 cm/s LVOT VTI:          0.195 m LVOT/AV VTI ratio: 0.75  AORTA Ao Root diam: 3.00 cm MITRAL VALVE  TRICUSPID VALVE MV Area (PHT): 4.52 cm    TR Peak grad:   26.0 mmHg MV Area VTI:   3.55 cm    TR Vmax:        255.00 cm/s MV Peak grad:  3.3 mmHg MV Mean grad:  1.0 mmHg    SHUNTS MV Vmax:       0.90 m/s    Systemic VTI:  0.20 m MV Vmean:      58.1 cm/s   Systemic Diam: 2.10 cm MV Decel Time: 168 msec MV E velocity: 71.35 cm/s MV A velocity: 74.80 cm/s MV E/A ratio:  0.95 Ida Rogue MD Electronically signed by Ida Rogue MD Signature Date/Time: 05/03/2021/4:08:02 PM    Final         Scheduled Meds:  aspirin  81 mg Oral Daily   carvedilol  6.25 mg Oral BID WC   cholecalciferol  1,000 Units Oral Daily   cyanocobalamin  1,000 mcg Subcutaneous Daily   enoxaparin (LOVENOX) injection  40 mg Subcutaneous Q24H   furosemide  40 mg Intravenous Daily   insulin aspart  0-15 Units Subcutaneous Q4H   losartan  25 mg Oral Daily   multivitamin with minerals  1 tablet Oral Daily   omega-3 acid ethyl esters  1 capsule Oral Daily   pantoprazole  40 mg Oral Daily   pravastatin  40 mg Oral q1800   sertraline  25 mg Oral Daily   Continuous Infusions:  sodium chloride 125 mL/hr at 05/03/21 0850   potassium PHOSPHATE IVPB (in mmol) 30 mmol (05/03/21 1031)     LOS: 2 days    Time spent: 40 minutes    Irine Seal, MD [facility]   To contact the attending provider between 7A-7P or the covering provider during after hours 7P-7A, please log into the web site [email or web address] and access using universal [place] password for that web site. If you  do not have the password, please call the hospital operator.  05/03/2021, 4:21 PM

## 2021-05-04 ENCOUNTER — Inpatient Hospital Stay: Payer: Medicare Other

## 2021-05-04 DIAGNOSIS — E871 Hypo-osmolality and hyponatremia: Secondary | ICD-10-CM | POA: Clinically undetermined

## 2021-05-04 LAB — CBC WITH DIFFERENTIAL/PLATELET
Abs Immature Granulocytes: 0.02 10*3/uL (ref 0.00–0.07)
Basophils Absolute: 0 10*3/uL (ref 0.0–0.1)
Basophils Relative: 1 %
Eosinophils Absolute: 0 10*3/uL (ref 0.0–0.5)
Eosinophils Relative: 1 %
HCT: 28.2 % — ABNORMAL LOW (ref 36.0–46.0)
Hemoglobin: 9.4 g/dL — ABNORMAL LOW (ref 12.0–15.0)
Immature Granulocytes: 0 %
Lymphocytes Relative: 22 %
Lymphs Abs: 1.3 10*3/uL (ref 0.7–4.0)
MCH: 31.4 pg (ref 26.0–34.0)
MCHC: 33.3 g/dL (ref 30.0–36.0)
MCV: 94.3 fL (ref 80.0–100.0)
Monocytes Absolute: 1 10*3/uL (ref 0.1–1.0)
Monocytes Relative: 17 %
Neutro Abs: 3.5 10*3/uL (ref 1.7–7.7)
Neutrophils Relative %: 59 %
Platelets: 158 10*3/uL (ref 150–400)
RBC: 2.99 MIL/uL — ABNORMAL LOW (ref 3.87–5.11)
RDW: 13.3 % (ref 11.5–15.5)
WBC: 5.9 10*3/uL (ref 4.0–10.5)
nRBC: 0 % (ref 0.0–0.2)

## 2021-05-04 LAB — GLUCOSE, CAPILLARY
Glucose-Capillary: 116 mg/dL — ABNORMAL HIGH (ref 70–99)
Glucose-Capillary: 160 mg/dL — ABNORMAL HIGH (ref 70–99)
Glucose-Capillary: 203 mg/dL — ABNORMAL HIGH (ref 70–99)
Glucose-Capillary: 192 mg/dL — ABNORMAL HIGH (ref 70–99)
Glucose-Capillary: 187 mg/dL — ABNORMAL HIGH (ref 70–99)

## 2021-05-04 LAB — RENAL FUNCTION PANEL
Albumin: 2.8 g/dL — ABNORMAL LOW (ref 3.5–5.0)
Anion gap: 6 (ref 5–15)
BUN: 22 mg/dL (ref 8–23)
CO2: 17 mmol/L — ABNORMAL LOW (ref 22–32)
Calcium: 7.9 mg/dL — ABNORMAL LOW (ref 8.9–10.3)
Chloride: 105 mmol/L (ref 98–111)
Creatinine, Ser: 0.84 mg/dL (ref 0.44–1.00)
GFR, Estimated: >60 mL/min (ref >60)
Glucose, Bld: 125 mg/dL — ABNORMAL HIGH (ref 70–99)
Phosphorus: 2 mg/dL — ABNORMAL LOW (ref 2.5–4.6)
Potassium: 4.2 mmol/L (ref 3.5–5.1)
Sodium: 128 mmol/L — ABNORMAL LOW (ref 135–145)

## 2021-05-04 LAB — MAGNESIUM: Magnesium: 2.1 mg/dL (ref 1.7–2.4)

## 2021-05-04 MED ORDER — SODIUM CHLORIDE 0.9 % IV BOLUS
250.0000 mL | Freq: Once | INTRAVENOUS | Status: AC
  Administered 2021-05-04: 250 mL via INTRAVENOUS

## 2021-05-04 MED ORDER — SODIUM CHLORIDE 0.9 % IV SOLN: INTRAVENOUS | Status: DC

## 2021-05-04 MED ORDER — MAGNESIUM OXIDE -MG SUPPLEMENT 400 (240 MG) MG PO TABS
400.0000 mg | ORAL_TABLET | Freq: Two times a day (BID) | ORAL | Status: AC
  Administered 2021-05-04 – 2021-05-06 (×6): 400 mg via ORAL
  Filled 2021-05-04 (×6): qty 1

## 2021-05-04 MED ORDER — CARVEDILOL 3.125 MG PO TABS
3.1250 mg | ORAL_TABLET | Freq: Two times a day (BID) | ORAL | Status: DC
Start: 2021-05-04 — End: 2021-05-07
  Administered 2021-05-04 – 2021-05-07 (×7): 3.125 mg via ORAL
  Filled 2021-05-04 (×7): qty 1

## 2021-05-04 MED ORDER — POTASSIUM PHOSPHATES 15 MMOLE/5ML IV SOLN
30.0000 mmol | Freq: Once | INTRAVENOUS | Status: AC
  Administered 2021-05-04: 30 mmol via INTRAVENOUS
  Filled 2021-05-04: qty 10

## 2021-05-04 NOTE — Assessment & Plan Note (Addendum)
-   Patient noted with hyponatremia with sodium of 128 (2/18). -Likely secondary to diuretics which have been discontinued.  -Hyponatremia improved with hydration.  -Outpatient follow-up with PCP.

## 2021-05-04 NOTE — Progress Notes (Signed)
PROGRESS NOTE    Cristina Morgan  LXB:[id number] DOB: 1935-01-02 DOA: 05/01/2021 PCP: Juline Patch, MD    Chief Complaint  Patient presents with   Fatigue    Brief Narrative:  Patient is a pleasant 87 year old female history of diabetes, hypertension, nontraumatic compression fractures brought to the ED due to altered mental status changes, increased lethargy, increased weakness, poor oral intake.  Patient noted to be hypercalcemic, CT chest with innumerable cortical lucencies throughout the ribs, thoracic spine, sternum worrisome for diffuse osseous metastases, lesions of multiple myeloma or diffuse metabolic bone disorder.  Patient placed on aggressive IV fluids, IV Lasix, calcitonin, Zometa.  Oncology consulted and multiple myeloma work-up underway.    Assessment & Plan:  Principal Problem:   Hypercalcemia Active Problems:   Acute metabolic encephalopathy   Type 2 diabetes mellitus (HCC)   Hypercholesterolemia   Essential hypertension   Pressure injury of skin   Vitamin B12 deficiency   Hypomagnesemia   Hypophosphatemia   SVT (supraventricular tachycardia) (HCC)   Hyponatremia    Assessment and Plan: * Hypercalcemia - Patient presented with lethargy, acute metabolic encephalopathy, noted to be hypercalcemic with CT scan findings with innumerable cortical lucencies throughout the ribs, thoracic spine, sternum worrisome for diffuse osseous metastases versus lesions of multiple myeloma or diffuse metabolic bone disorder. -PTH within normal limits with elevated intact calcium. -Magnesium level at 2.1 from 1.4 from 1.1, phosphorus level at 2.0 from 1.5 from 1.8. -Status post Zometa. -Status post calcitonin. -Continue IV fluids. -Discontinue IV Lasix. -Calcium levels improving. -Replete electrolytes. -Bone skeletal survey with innumerable small lucent lesions throughout the skeleton and moth-eaten appearance to the ribs and spine compatible with multiple myeloma.  Moderate  L1 and severe L2 vertebral compression fractures.  Severe lower thoracic vertebral compression fracture.  Acute or early subacute lateral left fourth and fifth rib fractures, - multiple myeloma panel ordered and pending per oncology. -Kappa lambda light chain ratio elevated at 13.03. -Free kappa light chains elevated. -Per oncology patient will need a bone marrow biopsy which has been scheduled by IR on 05/06/2021. -Oncology following and appreciate input and recommendations.  Acute metabolic encephalopathy - Likely secondary to hypercalcemia in the setting of dehydration. -No signs or symptoms of infection. -CT head with no acute intracranial abnormalities. -Improving clinically on a daily basis however not at baseline per family.    -Replete electrolytes, continue treatment for hypercalcemia.  -Continue IV fluids. -Discontinue IV Lasix.  Hyponatremia - Patient noted with hyponatremia with sodium of 128 this morning. -Likely secondary to diuretics which we will discontinue. -Continue with hydration with IV fluids. -Repeat labs in the a.m.  SVT (supraventricular tachycardia) ([place]) - Patient noted to have some runs of SVT in the 140s to the 160s (05/02/2021) per RN and noted on telemetry -Likely secondary to electrolyte abnormalities as magnesium was noted at 1.1 and currently at 2.1 with phosphorus at 1.8 and currently at 2.0 -TSH within normal limits.   -2D echo with normal EF.NWMA.  -Replete electrolytes to keep magnesium > 2 and potassium > 4.  -Replete phosphorus. -Patient with borderline blood pressure and as such we will decrease Coreg to 3.125 mg twice daily.    -IV Lopressor as needed heart rate > 130  Hypophosphatemia- (present on admission) - PTH within normal limits. -Phosphorus at 2.0. -K-Phos 30 mmol IV x1. -Repeat labs in the AM.  Hypomagnesemia- (present on admission) - Magnesium at 2.1. -Phosphorus at 2.0 -PTH within normal limits. -Repeat labs in the  a.m.  Vitamin B12 deficiency- (present on admission) - Repeat vitamin B12 levels at 3868.   -Discontinue vitamin B12 supplementation.   -Outpatient follow-up.    Pressure injury of skin Pressure Injury 05/01/21 Coccyx Mid Stage 2 -  Partial thickness loss of dermis presenting as a shallow open injury with a red, pink wound bed without slough. small open area (Active)  05/01/21 1500  Location: Coccyx  Location Orientation: Mid  Staging: Stage 2 -  Partial thickness loss of dermis presenting as a shallow open injury with a red, pink wound bed without slough.  Wound Description (Comments): small open area  Present on Admission: Yes       Essential hypertension- (present on admission) - BP noted to be borderline this morning.   -Decrease Coreg to 3.125 mg twice daily.   -Continue Cozaar.    Hypercholesterolemia- (present on admission) - Continue statin.  Type 2 diabetes mellitus (HCC) - Hemoglobin A1c 5.9 (02/01/2021) -CBG 160 this morning. -Sliding scale insulin         DVT prophylaxis: Lovenox Code Status: Full  Family Communication: Updated patient and daughter at bedside Disposition: TBD, likely home with home health per family preference, early next week.  Status is: Inpatient Remains inpatient appropriate because: Severity of illness           Consultants:  Oncology: Dr. Rogue Bussing 05/01/2021  Procedures:  CT chest 05/01/2021 CT head 05/01/2021 Bone survey 05/02/2021 2D echo 05/03/2021  Antimicrobials:  None   Subjective: Laying in bed.  More alert this morning.  More lucid.  Some occasional intermittent bouts of confusion.  No shortness of breath.  Tolerating diet however still with some poor oral intake.  Daughter at bedside.  Family feels mental status improving daily.    Objective: Vitals:   05/03/21 2056 05/04/21 0620 05/04/21 0800 05/04/21 1129  BP: (!) 90/46 137/70 (!) 112/58 121/69  Pulse: 70 74 72 99  Resp: '18 18 18 18  ' Temp: 98.1 F  (36.7 C) 98.6 F (37 C) 98.1 F (36.7 C) 98.3 F (36.8 C)  TempSrc:   Oral   SpO2: 96% 96% 96% 96%  Weight:      Height:        Intake/Output Summary (Last 24 hours) at 05/04/2021 1530 Last data filed at 05/04/2021 0653 Gross per 24 hour  Intake 937.75 ml  Output 800 ml  Net 137.75 ml   Filed Weights   05/01/21 0305 05/03/21 0426 05/03/21 0500  Weight: 55 kg 60.8 kg 60.8 kg    Examination:  General exam: NAD. Respiratory system: Lungs clear to auscultation bilaterally anterior lung fields.  No wheezes, no crackles, no rhonchi.  Normal respiratory effort.  Speaking in full sentences.  Cardiovascular system: RRR no murmurs rubs or gallops.  No JVD.  No lower extremity edema.  Gastrointestinal system: Abdomen is soft, nontender, nondistended, positive bowel sounds.  No rebound.  No guarding.   Central nervous system: Alert.  Less confused.  Moving extremities spontaneously.  No focal neurological deficits.  Extremities: Symmetric 5 x 5 power. Skin: No rashes, lesions or ulcers Psychiatry: Judgement and insight appear normal. Mood & affect appropriate.     Data Reviewed:   CBC: Recent Labs  Lab 05/01/21 0320 05/02/21 0609 05/03/21 0532 05/04/21 0430  WBC 6.7 12.2* 8.5 5.9  NEUTROABS 4.3  --  7.4 3.5  HGB 10.5* 10.5* 9.5* 9.4*  HCT 33.3* 32.9* 28.4* 28.2*  MCV 99.1 98.5 94.4 94.3  PLT 314 288 247 158  Basic Metabolic Panel: Recent Labs  Lab 05/01/21 0320 05/02/21 0609 05/02/21 0823 05/03/21 0532 05/04/21 0430  NA 135 138  --  131* 128*  K 3.7 3.8  --  3.5 4.2  CL 105 106  --  103 105  CO2 24 24  --  22 17*  GLUCOSE 253* 205*  --  178* 125*  BUN 24* 14  --  15 22  CREATININE 0.85 0.79  --  0.63 0.84  CALCIUM 11.4* 10.8* 11.0* 9.1 7.9*  MG  --  1.1*  --  1.4* 2.1  PHOS  --  1.8*  --  1.5* 2.0*    GFR: Estimated Creatinine Clearance: 47 mL/min (by C-G formula based on SCr of 0.84 mg/dL).  Liver Function Tests: Recent Labs  Lab 05/01/21 0320  05/03/21 0532 05/04/21 0430  AST 18  --   --   ALT 12  --   --   ALKPHOS 88  --   --   BILITOT 0.7  --   --   PROT 7.8  --   --   ALBUMIN 3.8 3.1* 2.8*    CBG: Recent Labs  Lab 05/03/21 2001 05/03/21 2337 05/04/21 0421 05/04/21 0829 05/04/21 1209  GLUCAP 189* 155* 116* 160* 203*     Recent Results (from the past 240 hour(s))  Resp Panel by RT-PCR (Flu A&B, Covid) Nasopharyngeal Swab     Status: None   Collection Time: 05/01/21  3:50 AM   Specimen: Nasopharyngeal Swab; Nasopharyngeal(NP) swabs in vial transport medium  Result Value Ref Range Status   SARS Coronavirus 2 by RT PCR NEGATIVE NEGATIVE Final    Comment: (NOTE) SARS-CoV-2 target nucleic acids are NOT DETECTED.  The SARS-CoV-2 RNA is generally detectable in upper respiratory specimens during the acute phase of infection. The lowest concentration of SARS-CoV-2 viral copies this assay can detect is 138 copies/mL. A negative result does not preclude SARS-Cov-2 infection and should not be used as the sole basis for treatment or other patient management decisions. A negative result may occur with  improper specimen collection/handling, submission of specimen other than nasopharyngeal swab, presence of viral mutation(s) within the areas targeted by this assay, and inadequate number of viral copies(<138 copies/mL). A negative result must be combined with clinical observations, patient history, and epidemiological information. The expected result is Negative.  Fact Sheet for Patients:  EntrepreneurPulse.com.au  Fact Sheet for Healthcare Providers:  IncredibleEmployment.be  This test is no t yet approved or cleared by the [place] FDA and  has been authorized for detection and/or diagnosis of SARS-CoV-2 by FDA under an Emergency Use Authorization (EUA). This EUA will remain  in effect (meaning this test can be used) for the duration of the COVID-19 declaration under Section  564(b)(1) of the Act, 21 U.S.C.section 360bbb-3(b)(1), unless the authorization is terminated  or revoked sooner.       Influenza A by PCR NEGATIVE NEGATIVE Final   Influenza B by PCR NEGATIVE NEGATIVE Final    Comment: (NOTE) The Xpert Xpress SARS-CoV-2/FLU/RSV plus assay is intended as an aid in the diagnosis of influenza from Nasopharyngeal swab specimens and should not be used as a sole basis for treatment. Nasal washings and aspirates are unacceptable for Xpert Xpress SARS-CoV-2/FLU/RSV testing.  Fact Sheet for Patients: EntrepreneurPulse.com.au  Fact Sheet for Healthcare Providers: IncredibleEmployment.be  This test is not yet approved or cleared by the [place] FDA and has been authorized for detection and/or diagnosis of SARS-CoV-2 by FDA under an Emergency  Use Authorization (EUA). This EUA will remain in effect (meaning this test can be used) for the duration of the COVID-19 declaration under Section 564(b)(1) of the Act, 21 U.S.C. section 360bbb-3(b)(1), unless the authorization is terminated or revoked.  Performed at [facility], [address], [place], [place] [address]          Radiology Studies: ECHOCARDIOGRAM COMPLETE  Result Date: 05/03/2021    ECHOCARDIOGRAM REPORT   Patient Name:   Cristina Morgan Date of Exam: 05/03/2021 Medical Rec #:  [id number]       Height:       67.0 in Accession #:    [id number]      Weight:       134.0 lb Date of Birth:  [birth year]       BSA:          1.706 m Patient Age:    89 years        BP:           161/80 mmHg Patient Gender: F               HR:           80 bpm. Exam Location:  [facility] Procedure: 2D Echo, Color Doppler and Cardiac Doppler Indications:     Supraventricular tachycardia  History:         Patient has no prior history of Echocardiogram examinations.                  Risk Factors:Hypertension, Diabetes and Dyslipidemia.  Sonographer:     Charmayne Sheer Referring Phys:  [place] Diagnosing Phys: Ida Rogue MD  Sonographer Comments: Suboptimal subcostal window. IMPRESSIONS  1. Left ventricular ejection fraction, by estimation, is 60 to 65%. The left ventricle has normal function. The left ventricle has no regional wall motion abnormalities. Left ventricular diastolic parameters are consistent with Grade I diastolic dysfunction (impaired relaxation).  2. Right ventricular systolic function is normal. The right ventricular size is normal.  3. The mitral valve is normal in structure. Mild to moderate mitral valve regurgitation. No evidence of mitral stenosis.  4. Tricuspid valve regurgitation is mild to moderate.  5. The aortic valve is normal in structure. Aortic valve regurgitation is mild. No aortic stenosis is present.  6. The inferior vena cava is normal in size with greater than 50% respiratory variability, suggesting right atrial pressure of 3 mmHg. FINDINGS  Left Ventricle: Left ventricular ejection fraction, by estimation, is 60 to 65%. The left ventricle has normal function. The left ventricle has no regional wall motion abnormalities. The left ventricular internal cavity size was normal in size. There is  no left ventricular hypertrophy. Left ventricular diastolic parameters are consistent with Grade I diastolic dysfunction (impaired relaxation). Right Ventricle: The right ventricular size is normal. No increase in right ventricular wall thickness. Right ventricular systolic function is normal. Left Atrium: Left atrial size was normal in size. Right Atrium: Right atrial size was normal in size. Pericardium: There is no evidence of pericardial effusion. Mitral Valve: The mitral valve is normal in structure. Mild to moderate mitral valve regurgitation. No evidence of mitral valve stenosis. MV peak gradient, 3.3 mmHg. The mean mitral valve gradient is 1.0 mmHg. Tricuspid Valve: The tricuspid valve is normal in structure. Tricuspid valve regurgitation is mild to  moderate. No evidence of tricuspid stenosis. Aortic Valve: The aortic valve is normal in structure. Aortic valve regurgitation is mild. No aortic stenosis is present.  Aortic valve mean gradient measures 4.0 mmHg. Aortic valve peak gradient measures 6.4 mmHg. Aortic valve area, by VTI measures 2.61 cm. Pulmonic Valve: The pulmonic valve was normal in structure. Pulmonic valve regurgitation is mild. No evidence of pulmonic stenosis. Aorta: The aortic root is normal in size and structure. Venous: The inferior vena cava is normal in size with greater than 50% respiratory variability, suggesting right atrial pressure of 3 mmHg. IAS/Shunts: No atrial level shunt detected by color flow Doppler.  LEFT VENTRICLE PLAX 2D LVIDd:         4.06 cm   Diastology LVIDs:         2.78 cm   LV e' medial:    4.79 cm/s LV PW:         1.18 cm   LV E/e' medial:  14.9 LV IVS:        0.97 cm   LV e' lateral:   6.20 cm/s LVOT diam:     2.10 cm   LV E/e' lateral: 11.5 LV SV:         68 LV SV Index:   40 LVOT Area:     3.46 cm  RIGHT VENTRICLE RV Basal diam:  4.04 cm LEFT ATRIUM             Index        RIGHT ATRIUM           Index LA diam:        3.90 cm 2.29 cm/m   RA Area:     13.40 cm LA Vol (A2C):   34.9 ml 20.46 ml/m  RA Volume:   33.00 ml  19.34 ml/m LA Vol (A4C):   59.1 ml 34.64 ml/m LA Biplane Vol: 48.9 ml 28.66 ml/m  AORTIC VALVE                    PULMONIC VALVE AV Area (Vmax):    2.49 cm     PV Vmax:          0.91 m/s AV Area (Vmean):   2.54 cm     PV Vmean:         68.700 cm/s AV Area (VTI):     2.61 cm     PV VTI:           0.177 m AV Vmax:           126.00 cm/s  PV Peak grad:     3.3 mmHg AV Vmean:          92.400 cm/s  PV Mean grad:     2.0 mmHg AV VTI:            0.259 m      PR End Diast Vel: 3.98 msec AV Peak Grad:      6.4 mmHg AV Mean Grad:      4.0 mmHg LVOT Vmax:         90.70 cm/s LVOT Vmean:        67.700 cm/s LVOT VTI:          0.195 m LVOT/AV VTI ratio: 0.75  AORTA Ao Root diam: 3.00 cm MITRAL VALVE                TRICUSPID VALVE MV Area (PHT): 4.52 cm    TR Peak grad:   26.0 mmHg MV Area VTI:   3.55 cm    TR Vmax:        255.00 cm/s MV Peak grad:  3.3 mmHg MV Mean grad:  1.0 mmHg    SHUNTS MV Vmax:       0.90 m/s    Systemic VTI:  0.20 m MV Vmean:      58.1 cm/s   Systemic Diam: 2.10 cm MV Decel Time: 168 msec MV E velocity: 71.35 cm/s MV A velocity: 74.80 cm/s MV E/A ratio:  0.95 Ida Rogue MD Electronically signed by Ida Rogue MD Signature Date/Time: 05/03/2021/4:08:02 PM    Final         Scheduled Meds:  aspirin  81 mg Oral Daily   carvedilol  3.125 mg Oral BID WC   cholecalciferol  1,000 Units Oral Daily   cyanocobalamin  1,000 mcg Subcutaneous Daily   enoxaparin (LOVENOX) injection  40 mg Subcutaneous Q24H   insulin aspart  0-15 Units Subcutaneous Q4H   losartan  25 mg Oral Daily   magnesium oxide  400 mg Oral BID   multivitamin with minerals  1 tablet Oral Daily   omega-3 acid ethyl esters  1 capsule Oral Daily   pantoprazole  40 mg Oral Daily   pravastatin  40 mg Oral q1800   sertraline  25 mg Oral Daily   Continuous Infusions:  potassium PHOSPHATE IVPB (in mmol) 30 mmol (05/04/21 1027)     LOS: 3 days    Time spent: 40 minutes    Irine Seal, MD [facility]   To contact the attending provider between 7A-7P or the covering provider during after hours 7P-7A, please log into the web site [email or web address] and access using universal [place] password for that web site. If you do not have the password, please call the hospital operator.  05/04/2021, 3:30 PM

## 2021-05-05 LAB — CBC
HCT: 30.9 % — ABNORMAL LOW (ref 36.0–46.0)
Hemoglobin: 10.1 g/dL — ABNORMAL LOW (ref 12.0–15.0)
MCH: 31.1 pg (ref 26.0–34.0)
MCHC: 32.7 g/dL (ref 30.0–36.0)
MCV: 95.1 fL (ref 80.0–100.0)
Platelets: 246 10*3/uL (ref 150–400)
RBC: 3.25 MIL/uL — ABNORMAL LOW (ref 3.87–5.11)
RDW: 13.4 % (ref 11.5–15.5)
WBC: 5.2 10*3/uL (ref 4.0–10.5)
nRBC: 0 % (ref 0.0–0.2)

## 2021-05-05 LAB — RENAL FUNCTION PANEL
Albumin: 3.1 g/dL — ABNORMAL LOW (ref 3.5–5.0)
Anion gap: 5 (ref 5–15)
BUN: 16 mg/dL (ref 8–23)
CO2: 19 mmol/L — ABNORMAL LOW (ref 22–32)
Calcium: 7.8 mg/dL — ABNORMAL LOW (ref 8.9–10.3)
Chloride: 108 mmol/L (ref 98–111)
Creatinine, Ser: 0.73 mg/dL (ref 0.44–1.00)
GFR, Estimated: >60 mL/min (ref >60)
Glucose, Bld: 110 mg/dL — ABNORMAL HIGH (ref 70–99)
Phosphorus: 1.7 mg/dL — ABNORMAL LOW (ref 2.5–4.6)
Potassium: 3.7 mmol/L (ref 3.5–5.1)
Sodium: 132 mmol/L — ABNORMAL LOW (ref 135–145)

## 2021-05-05 LAB — MAGNESIUM: Magnesium: 2 mg/dL (ref 1.7–2.4)

## 2021-05-05 LAB — GLUCOSE, CAPILLARY
Glucose-Capillary: 104 mg/dL — ABNORMAL HIGH (ref 70–99)
Glucose-Capillary: 111 mg/dL — ABNORMAL HIGH (ref 70–99)
Glucose-Capillary: 143 mg/dL — ABNORMAL HIGH (ref 70–99)
Glucose-Capillary: 161 mg/dL — ABNORMAL HIGH (ref 70–99)
Glucose-Capillary: 173 mg/dL — ABNORMAL HIGH (ref 70–99)
Glucose-Capillary: 336 mg/dL — ABNORMAL HIGH (ref 70–99)
Glucose-Capillary: 78 mg/dL (ref 70–99)

## 2021-05-05 MED ORDER — ENSURE ENLIVE PO LIQD
237.0000 mL | Freq: Two times a day (BID) | ORAL | Status: DC
  Administered 2021-05-05 – 2021-05-07 (×4): 237 mL via ORAL

## 2021-05-05 MED ORDER — MELATONIN 5 MG PO TABS
2.5000 mg | ORAL_TABLET | Freq: Every evening | ORAL | Status: DC | PRN
  Administered 2021-05-05: 2.5 mg via ORAL
  Filled 2021-05-05: qty 1

## 2021-05-05 MED ORDER — POTASSIUM PHOSPHATES 15 MMOLE/5ML IV SOLN
30.0000 mmol | Freq: Once | INTRAVENOUS | Status: AC
  Administered 2021-05-05: 30 mmol via INTRAVENOUS
  Filled 2021-05-05: qty 10

## 2021-05-05 NOTE — Plan of Care (Signed)

## 2021-05-05 NOTE — Progress Notes (Signed)
PROGRESS NOTE    Cristina Morgan  VOP:[id number] DOB: September 08, 1934 DOA: 05/01/2021 PCP: Juline Patch, MD    Chief Complaint  Patient presents with   Fatigue    Brief Narrative:  Patient is a pleasant 87 year old female history of diabetes, hypertension, nontraumatic compression fractures brought to the ED due to altered mental status changes, increased lethargy, increased weakness, poor oral intake.  Patient noted to be hypercalcemic, CT chest with innumerable cortical lucencies throughout the ribs, thoracic spine, sternum worrisome for diffuse osseous metastases, lesions of multiple myeloma or diffuse metabolic bone disorder.  Patient placed on aggressive IV fluids, IV Lasix, calcitonin, Zometa.  Oncology consulted and multiple myeloma work-up underway.    Assessment & Plan:  Principal Problem:   Hypercalcemia Active Problems:   Acute metabolic encephalopathy   Type 2 diabetes mellitus (HCC)   Hypercholesterolemia   Essential hypertension   Pressure injury of skin   Vitamin B12 deficiency   Hypomagnesemia   Hypophosphatemia   SVT (supraventricular tachycardia) (HCC)   Hyponatremia    Assessment and Plan: * Hypercalcemia - Patient presented with lethargy, acute metabolic encephalopathy, noted to be hypercalcemic with CT scan findings with innumerable cortical lucencies throughout the ribs, thoracic spine, sternum worrisome for diffuse osseous metastases versus lesions of multiple myeloma or diffuse metabolic bone disorder. -PTH within normal limits with elevated intact calcium. -Magnesium level at 2.1 from 1.4 from 1.1, phosphorus level at 1.7 from 2.0 from 1.5 from 1.8. -Status post Zometa. -Status post calcitonin. -Continue IV fluids. -Discontinued IV Lasix. -Calcium levels improving. -Corrected calcium level at 8.52. -Replete electrolytes. -Bone skeletal survey with innumerable small lucent lesions throughout the skeleton and moth-eaten appearance to the ribs and  spine compatible with multiple myeloma.  Moderate L1 and severe L2 vertebral compression fractures.  Severe lower thoracic vertebral compression fracture.  Acute or early subacute lateral left fourth and fifth rib fractures, - multiple myeloma panel ordered and pending per oncology. -Kappa lambda light chain ratio elevated at 13.03. -Free kappa light chains elevated. -Per oncology patient will need a bone marrow biopsy which has been scheduled by IR on 05/06/2021. -Oncology following and appreciate input and recommendations.  Acute metabolic encephalopathy - Likely secondary to hypercalcemia in the setting of dehydration. -No signs or symptoms of infection. -CT head with no acute intracranial abnormalities. -Improving clinically on a daily basis however not at baseline per family.    -Replete electrolytes, continue treatment for hypercalcemia.  -Continue IV fluids for another 24 hours. -Discontinued IV Lasix.  Hyponatremia - Patient noted with hyponatremia with sodium of 128 (2/18). -Likely secondary to diuretics which have been discontinued.  -Hyponatremia improving with hydration.  -Decrease IV fluid rate.  -Repeat labs in the AM.   SVT (supraventricular tachycardia) ([place]) - Patient noted to have some runs of SVT in the 140s to the 160s (05/02/2021) per RN and noted on telemetry -Likely secondary to electrolyte abnormalities as magnesium was noted at 1.1 and currently at 2.0 with phosphorus at 1.8 and currently at 1.7 -TSH within normal limits.   -2D echo with normal EF.NWMA.  -Replete electrolytes to keep magnesium > 2 and potassium > 4.  -Replete phosphorus. -Patient with borderline blood pressure and as such Coreg decreased to 3.125 mg twice daily.  -IV Lopressor as needed heart rate > 130  Hypophosphatemia- (present on admission) - PTH within normal limits. -Phosphorus at 1.7 -K-Phos 30 mmol IV x1. -Repeat labs in the AM.  Hypomagnesemia- (present on admission) - Magnesium  at  2.0 -Phosphorus at 1.7. -PTH within normal limits. -Repeat labs in the a.m.  Vitamin B12 deficiency- (present on admission) - Repeat vitamin B12 levels at 3868.   -Discontinued vitamin B12 supplementation.   -Outpatient follow-up.    Pressure injury of skin Pressure Injury 05/01/21 Coccyx Mid Stage 2 -  Partial thickness loss of dermis presenting as a shallow open injury with a red, pink wound bed without slough. small open area (Active)  05/01/21 1500  Location: Coccyx  Location Orientation: Mid  Staging: Stage 2 -  Partial thickness loss of dermis presenting as a shallow open injury with a red, pink wound bed without slough.  Wound Description (Comments): small open area  Present on Admission: Yes       Essential hypertension- (present on admission) - BP noted to be borderline on 05/04/2021. -Coreg decreased to 3.125 mg twice daily. -Continue Cozaar.  Hypercholesterolemia- (present on admission) - Continue statin.  Type 2 diabetes mellitus (HCC) - Hemoglobin A1c 5.9 (02/01/2021) -CBG 111 this morning. -Sliding scale insulin         DVT prophylaxis: Lovenox Code Status: Full  Family Communication: Updated patient and daughter at bedside Disposition: TBD, likely home with home health per family preference, early next week.  Status is: Inpatient Remains inpatient appropriate because: Severity of illness           Consultants:  Oncology: Dr. Rogue Bussing 05/01/2021 Interventional radiology: Candiss Norse, PA 05/03/2021  Procedures:  CT chest 05/01/2021 CT head 05/01/2021 Bone survey 05/02/2021 2D echo 05/03/2021  Antimicrobials:  None   Subjective: Alert.  Laying in bed.  More lucid.  Daughter at bedside stated that mother had some intermittent episodes of confusion which has improved since this morning.  No abdominal pain.  Tolerating diet.   Objective: Vitals:   05/05/21 0500 05/05/21 0542 05/05/21 0752 05/05/21 1535  BP:  (!) 169/68 (!)  149/57 (!) 146/55  Pulse:  81 85 79  Resp:  '20 16 18  ' Temp:  99 F (37.2 C) 98.7 F (37.1 C) 98.1 F (36.7 C)  TempSrc:      SpO2:  95% 96% 98%  Weight: 63.7 kg     Height:        Intake/Output Summary (Last 24 hours) at 05/05/2021 1609 Last data filed at 05/05/2021 1015 Gross per 24 hour  Intake 1401.82 ml  Output 1400 ml  Net 1.82 ml   Filed Weights   05/03/21 0426 05/03/21 0500 05/05/21 0500  Weight: 60.8 kg 60.8 kg 63.7 kg    Examination:  General exam: NAD. Respiratory system: CTA B anterior lung fields.  No wheezes, no crackles, no rhonchi.  Normal respiratory effort.  Speaking in full sentences.  Cardiovascular system: Regular rate rhythm no murmurs rubs or gallops.  No JVD.  No lower extremity edema.  Gastrointestinal system: Abdomen is soft, nontender, nondistended, positive bowel sounds.  No rebound.  No guarding. Central nervous system: Alert.  Less confused.  Moving extremities spontaneously.  No focal neurological deficits.  Extremities: Symmetric 5 x 5 power. Skin: No rashes, lesions or ulcers Psychiatry: Judgement and insight appear normal. Mood & affect appropriate.     Data Reviewed:   CBC: Recent Labs  Lab 05/01/21 0320 05/02/21 0609 05/03/21 0532 05/04/21 0430 05/05/21 0532  WBC 6.7 12.2* 8.5 5.9 5.2  NEUTROABS 4.3  --  7.4 3.5  --   HGB 10.5* 10.5* 9.5* 9.4* 10.1*  HCT 33.3* 32.9* 28.4* 28.2* 30.9*  MCV 99.1 98.5 94.4 94.3 95.1  PLT 314 288 247 158 570    Basic Metabolic Panel: Recent Labs  Lab 05/01/21 0320 05/02/21 0609 05/02/21 0823 05/03/21 0532 05/04/21 0430 05/05/21 0532  NA 135 138  --  131* 128* 132*  K 3.7 3.8  --  3.5 4.2 3.7  CL 105 106  --  103 105 108  CO2 24 24  --  22 17* 19*  GLUCOSE 253* 205*  --  178* 125* 110*  BUN 24* 14  --  '15 22 16  ' CREATININE 0.85 0.79  --  0.63 0.84 0.73  CALCIUM 11.4* 10.8* 11.0* 9.1 7.9* 7.8*  MG  --  1.1*  --  1.4* 2.1 2.0  PHOS  --  1.8*  --  1.5* 2.0* 1.7*    GFR: Estimated  Creatinine Clearance: 50 mL/min (by C-G formula based on SCr of 0.73 mg/dL).  Liver Function Tests: Recent Labs  Lab 05/01/21 0320 05/03/21 0532 05/04/21 0430 05/05/21 0532  AST 18  --   --   --   ALT 12  --   --   --   ALKPHOS 88  --   --   --   BILITOT 0.7  --   --   --   PROT 7.8  --   --   --   ALBUMIN 3.8 3.1* 2.8* 3.1*    CBG: Recent Labs  Lab 05/05/21 0417 05/05/21 0607 05/05/21 0743 05/05/21 1221 05/05/21 1600  GLUCAP 104* 111* 161* 336* 173*     Recent Results (from the past 240 hour(s))  Resp Panel by RT-PCR (Flu A&B, Covid) Nasopharyngeal Swab     Status: None   Collection Time: 05/01/21  3:50 AM   Specimen: Nasopharyngeal Swab; Nasopharyngeal(NP) swabs in vial transport medium  Result Value Ref Range Status   SARS Coronavirus 2 by RT PCR NEGATIVE NEGATIVE Final    Comment: (NOTE) SARS-CoV-2 target nucleic acids are NOT DETECTED.  The SARS-CoV-2 RNA is generally detectable in upper respiratory specimens during the acute phase of infection. The lowest concentration of SARS-CoV-2 viral copies this assay can detect is 138 copies/mL. A negative result does not preclude SARS-Cov-2 infection and should not be used as the sole basis for treatment or other patient management decisions. A negative result may occur with  improper specimen collection/handling, submission of specimen other than nasopharyngeal swab, presence of viral mutation(s) within the areas targeted by this assay, and inadequate number of viral copies(<138 copies/mL). A negative result must be combined with clinical observations, patient history, and epidemiological information. The expected result is Negative.  Fact Sheet for Patients:  EntrepreneurPulse.com.au  Fact Sheet for Healthcare Providers:  IncredibleEmployment.be  This test is no t yet approved or cleared by the [place] FDA and  has been authorized for detection and/or diagnosis of  SARS-CoV-2 by FDA under an Emergency Use Authorization (EUA). This EUA will remain  in effect (meaning this test can be used) for the duration of the COVID-19 declaration under Section 564(b)(1) of the Act, 21 U.S.C.section 360bbb-3(b)(1), unless the authorization is terminated  or revoked sooner.       Influenza A by PCR NEGATIVE NEGATIVE Final   Influenza B by PCR NEGATIVE NEGATIVE Final    Comment: (NOTE) The Xpert Xpress SARS-CoV-2/FLU/RSV plus assay is intended as an aid in the diagnosis of influenza from Nasopharyngeal swab specimens and should not be used as a sole basis for treatment. Nasal washings and aspirates are unacceptable for Xpert Xpress SARS-CoV-2/FLU/RSV testing.  Fact Sheet  for Patients: EntrepreneurPulse.com.au  Fact Sheet for Healthcare Providers: IncredibleEmployment.be  This test is not yet approved or cleared by the [place] FDA and has been authorized for detection and/or diagnosis of SARS-CoV-2 by FDA under an Emergency Use Authorization (EUA). This EUA will remain in effect (meaning this test can be used) for the duration of the COVID-19 declaration under Section 564(b)(1) of the Act, 21 U.S.C. section 360bbb-3(b)(1), unless the authorization is terminated or revoked.  Performed at [facility], [address], [place], [place] [address]          Radiology Studies: No results found.      Scheduled Meds:  aspirin  81 mg Oral Daily   carvedilol  3.125 mg Oral BID WC   cholecalciferol  1,000 Units Oral Daily   enoxaparin (LOVENOX) injection  40 mg Subcutaneous Q24H   feeding supplement  237 mL Oral BID BM   insulin aspart  0-15 Units Subcutaneous Q4H   losartan  25 mg Oral Daily   magnesium oxide  400 mg Oral BID   multivitamin with minerals  1 tablet Oral Daily   omega-3 acid ethyl esters  1 capsule Oral Daily   pantoprazole  40 mg Oral Daily   pravastatin  40 mg Oral q1800    sertraline  25 mg Oral Daily   Continuous Infusions:  sodium chloride 100 mL/hr at 05/05/21 0738   potassium PHOSPHATE IVPB (in mmol) 30 mmol (05/05/21 1045)     LOS: 4 days    Time spent: 40 minutes    Irine Seal, MD [facility]   To contact the attending provider between 7A-7P or the covering provider during after hours 7P-7A, please log into the web site [email or web address] and access using universal [place] password for that web site. If you do not have the password, please call the hospital operator.  05/05/2021, 4:09 PM

## 2021-05-06 ENCOUNTER — Inpatient Hospital Stay: Payer: Medicare Other

## 2021-05-06 LAB — RENAL FUNCTION PANEL
Albumin: 3.1 g/dL — ABNORMAL LOW (ref 3.5–5.0)
Anion gap: 7 (ref 5–15)
BUN: 14 mg/dL (ref 8–23)
CO2: 18 mmol/L — ABNORMAL LOW (ref 22–32)
Calcium: 8 mg/dL — ABNORMAL LOW (ref 8.9–10.3)
Chloride: 105 mmol/L (ref 98–111)
Creatinine, Ser: 0.81 mg/dL (ref 0.44–1.00)
GFR, Estimated: >60 mL/min (ref >60)
Glucose, Bld: 144 mg/dL — ABNORMAL HIGH (ref 70–99)
Phosphorus: 1.4 mg/dL — ABNORMAL LOW (ref 2.5–4.6)
Potassium: 4 mmol/L (ref 3.5–5.1)
Sodium: 130 mmol/L — ABNORMAL LOW (ref 135–145)

## 2021-05-06 LAB — CBC WITH DIFFERENTIAL/PLATELET
Abs Immature Granulocytes: 0.03 10*3/uL (ref 0.00–0.07)
Basophils Absolute: 0 10*3/uL (ref 0.0–0.1)
Basophils Relative: 0 %
Eosinophils Absolute: 0.1 10*3/uL (ref 0.0–0.5)
Eosinophils Relative: 2 %
HCT: 31.9 % — ABNORMAL LOW (ref 36.0–46.0)
Hemoglobin: 10.4 g/dL — ABNORMAL LOW (ref 12.0–15.0)
Immature Granulocytes: 1 %
Lymphocytes Relative: 30 %
Lymphs Abs: 1.9 10*3/uL (ref 0.7–4.0)
MCH: 30.8 pg (ref 26.0–34.0)
MCHC: 32.6 g/dL (ref 30.0–36.0)
MCV: 94.4 fL (ref 80.0–100.0)
Monocytes Absolute: 0.6 10*3/uL (ref 0.1–1.0)
Monocytes Relative: 9 %
Neutro Abs: 3.8 10*3/uL (ref 1.7–7.7)
Neutrophils Relative %: 58 %
Platelets: 252 10*3/uL (ref 150–400)
RBC: 3.38 MIL/uL — ABNORMAL LOW (ref 3.87–5.11)
RDW: 13.3 % (ref 11.5–15.5)
WBC: 6.5 10*3/uL (ref 4.0–10.5)
nRBC: 0 % (ref 0.0–0.2)

## 2021-05-06 LAB — MULTIPLE MYELOMA PANEL, SERUM
Albumin SerPl Elph-Mcnc: 4.4 g/dL (ref 2.9–4.4)
Albumin/Glob SerPl: 0.9 (ref 0.7–1.7)
Alpha 1: 0.3 g/dL (ref 0.0–0.4)
Alpha2 Glob SerPl Elph-Mcnc: 0.9 g/dL (ref 0.4–1.0)
B-Globulin SerPl Elph-Mcnc: 1.2 g/dL (ref 0.7–1.3)
Gamma Glob SerPl Elph-Mcnc: 2.5 g/dL — ABNORMAL HIGH (ref 0.4–1.8)
Globulin, Total: 4.9 g/dL — ABNORMAL HIGH (ref 2.2–3.9)
IgA: 53 mg/dL — ABNORMAL LOW (ref 64–422)
IgG (Immunoglobin G), Serum: 2922 mg/dL — ABNORMAL HIGH (ref 586–1602)
IgM (Immunoglobulin M), Srm: 34 mg/dL (ref 26–217)
M Protein SerPl Elph-Mcnc: 2.2 g/dL — ABNORMAL HIGH
Total Protein ELP: 9.3 g/dL — ABNORMAL HIGH (ref 6.0–8.5)

## 2021-05-06 LAB — GLUCOSE, CAPILLARY
Glucose-Capillary: 113 mg/dL — ABNORMAL HIGH (ref 70–99)
Glucose-Capillary: 124 mg/dL — ABNORMAL HIGH (ref 70–99)
Glucose-Capillary: 150 mg/dL — ABNORMAL HIGH (ref 70–99)
Glucose-Capillary: 153 mg/dL — ABNORMAL HIGH (ref 70–99)
Glucose-Capillary: 157 mg/dL — ABNORMAL HIGH (ref 70–99)
Glucose-Capillary: 181 mg/dL — ABNORMAL HIGH (ref 70–99)

## 2021-05-06 LAB — MAGNESIUM: Magnesium: 2 mg/dL (ref 1.7–2.4)

## 2021-05-06 IMAGING — CT CT BIOPSY BONE MARROW
1 of 2 series · 11 of 14 positions shown, 14 images · non-contrast
Comparison: none

INDICATION: 87-year-old female with history of hypercalcemia of malignancy
presenting for bone marrow biopsy.

[Series 2: i-spiral 5.0 br38 · axial · 0.98mm/px · z∈[+912,+1003]mm · 11 of 32 slices shown, 14 images]
[im 3/32  soft-tissue]
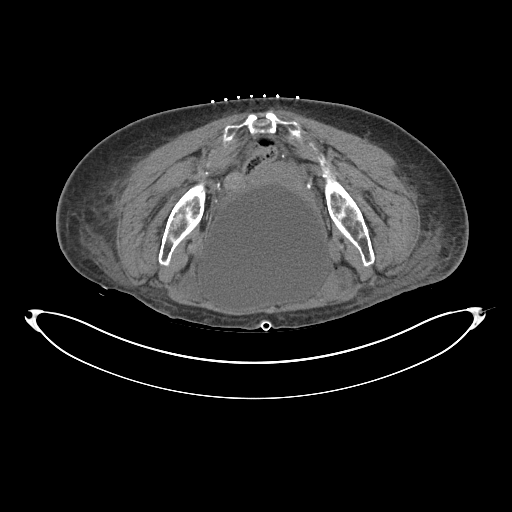
[im 3/32  bone]
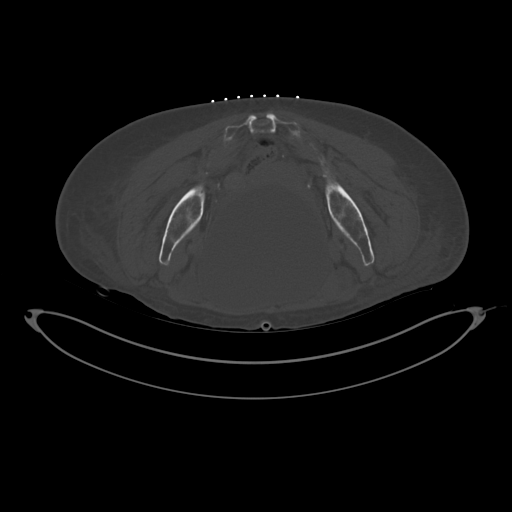
[im 6/32  bone]
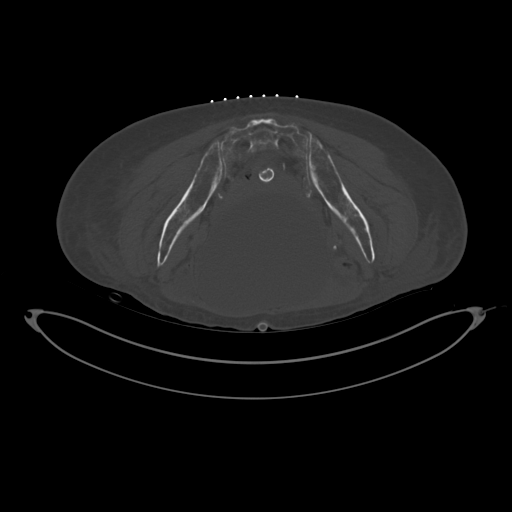
[im 8/32  bone]
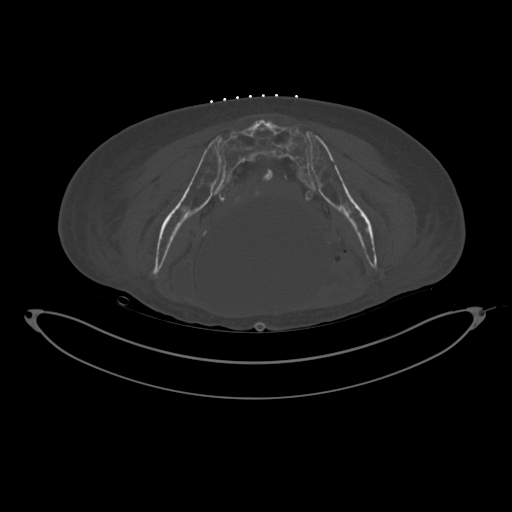
[im 11/32  bone]
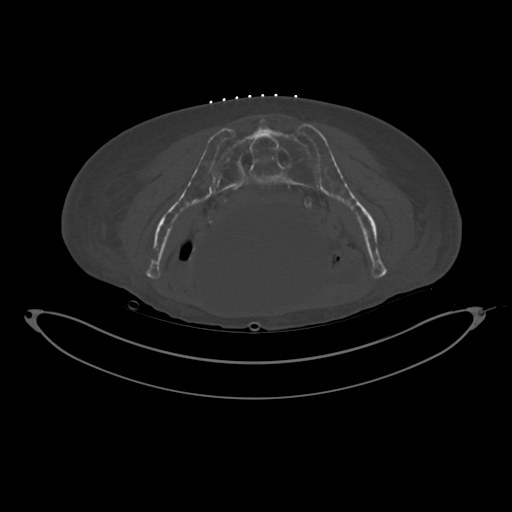
[im 13/32  soft-tissue]
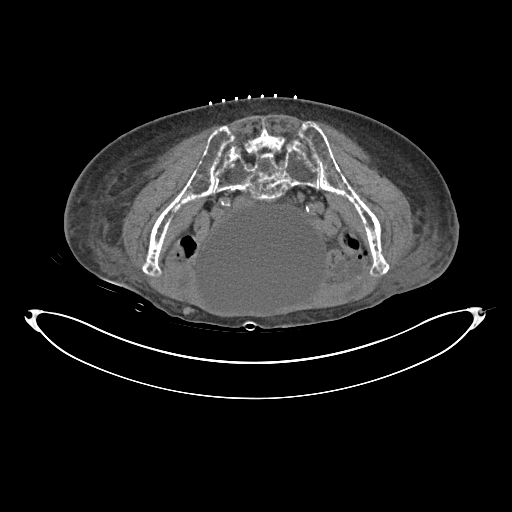
[im 13/32  bone]
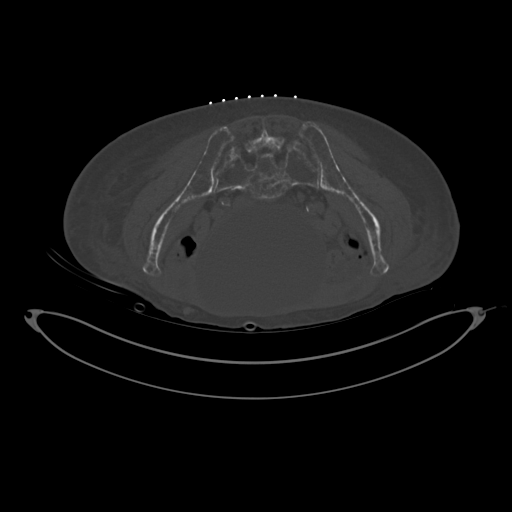
[im 16/32  bone]
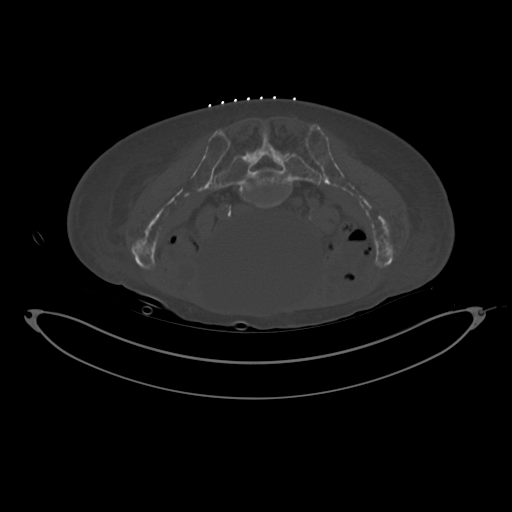
[im 19/32  bone]
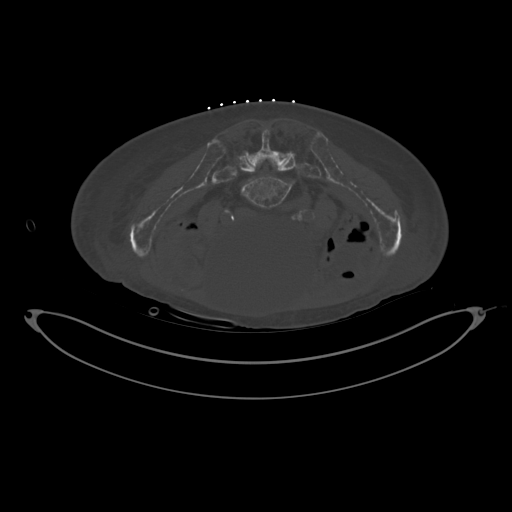
[im 21/32  bone]
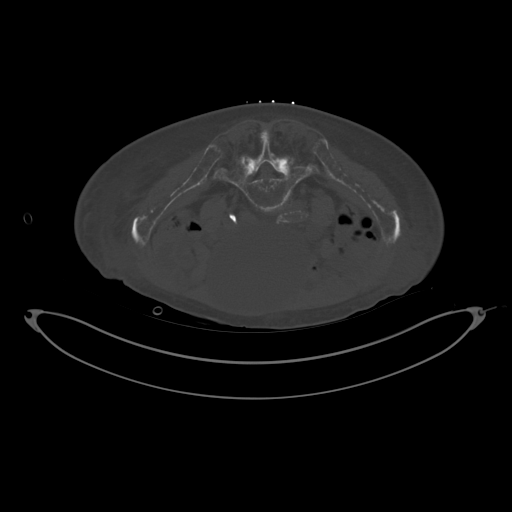
[im 24/32  soft-tissue]
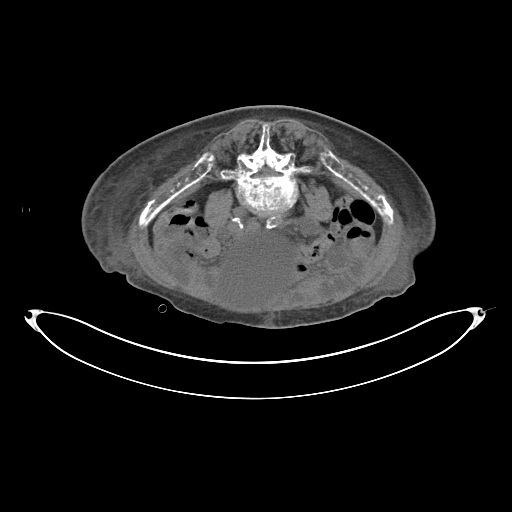
[im 24/32  bone]
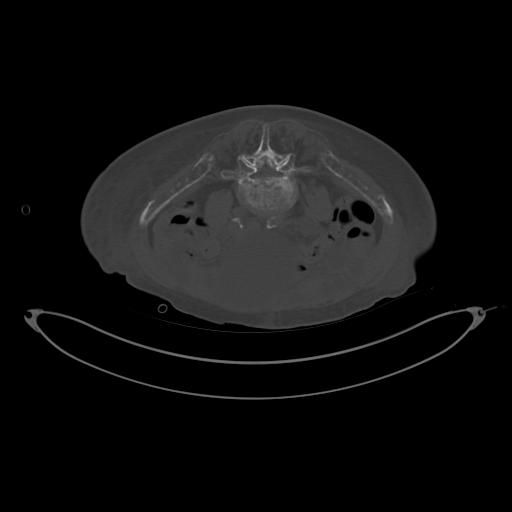
[im 26/32  bone]
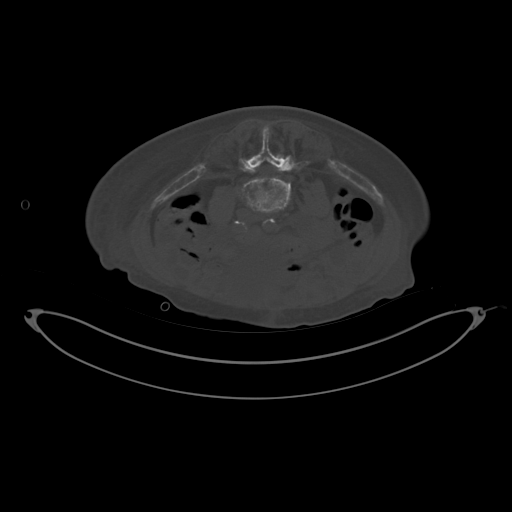
[im 29/32  bone]
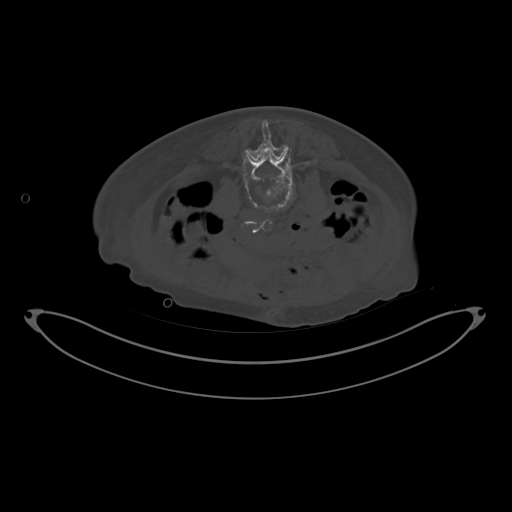

[11 of 14 positions shown; findings below may reference images not displayed]

EXAM:
CT-GUIDED BONE MARROW BIOPSY AND ASPIRATION

MEDICATIONS:
None

ANESTHESIA/SEDATION:
Fentanyl 25 mcg IV; Versed 0 mg IV

Sedation Time: 0 minutes; The patient was continuously monitored
during the procedure by the interventional radiology nurse under my
direct supervision.

COMPLICATIONS:
None immediate.

PROCEDURE:
Informed consent was obtained from the patient following an
explanation of the procedure, risks, benefits and alternatives. The
patient understands, agrees and consents for the procedure. All
questions were addressed. A time out was performed prior to the
initiation of the procedure.

The patient was positioned prone and non-contrast localization CT
was performed of the pelvis to demonstrate the iliac marrow spaces.
The operative site was prepped and draped in the usual sterile
fashion.

Under sterile conditions and local anesthesia, a 22 gauge spinal
needle was utilized for procedural planning. Next, an 11 gauge
coaxial bone biopsy needle was advanced into the right iliac marrow
space. Needle position was confirmed with CT imaging. Initially, a
bone marrow aspiration was performed. Next, a bone marrow biopsy was
obtained with the 11 gauge outer bone marrow device. Samples were
prepared with the cytotechnologist and deemed adequate. The needle
was removed and superficial hemostasis was obtained with manual
compression. A dressing was applied. The patient tolerated the
procedure well without immediate post procedural complication.
IMPRESSION: Successful CT guided right iliac bone marrow aspiration and core
biopsy.

## 2021-05-06 MED ORDER — DIPHENHYDRAMINE HCL 50 MG/ML IJ SOLN
12.5000 mg | Freq: Once | INTRAMUSCULAR | Status: AC
  Administered 2021-05-06: 12.5 mg via INTRAVENOUS
  Filled 2021-05-06: qty 1

## 2021-05-06 MED ORDER — FENTANYL CITRATE (PF) 100 MCG/2ML IJ SOLN
INTRAMUSCULAR | Status: AC | PRN
  Administered 2021-05-06: 25 ug via INTRAVENOUS

## 2021-05-06 MED ORDER — PHENOL 1.4 % MT LIQD
1.0000 | OROMUCOSAL | Status: DC | PRN
  Filled 2021-05-06 (×2): qty 177

## 2021-05-06 MED ORDER — POTASSIUM PHOSPHATES 15 MMOLE/5ML IV SOLN
30.0000 mmol | Freq: Once | INTRAVENOUS | Status: AC
  Administered 2021-05-06: 30 mmol via INTRAVENOUS
  Filled 2021-05-06: qty 10

## 2021-05-06 MED ORDER — MIDAZOLAM HCL 2 MG/2ML IJ SOLN
INTRAMUSCULAR | Status: AC
  Filled 2021-05-06: qty 2

## 2021-05-06 MED ORDER — K PHOS MONO-SOD PHOS DI & MONO 155-852-130 MG PO TABS
250.0000 mg | ORAL_TABLET | Freq: Two times a day (BID) | ORAL | Status: DC
  Administered 2021-05-06 – 2021-05-07 (×3): 250 mg via ORAL
  Filled 2021-05-06 (×3): qty 1

## 2021-05-06 MED ORDER — FENTANYL CITRATE (PF) 100 MCG/2ML IJ SOLN
INTRAMUSCULAR | Status: AC
  Filled 2021-05-06: qty 2

## 2021-05-06 MED ORDER — HEPARIN SOD (PORK) LOCK FLUSH 100 UNIT/ML IV SOLN
INTRAVENOUS | Status: AC
  Filled 2021-05-06: qty 5

## 2021-05-06 MED ORDER — SODIUM BICARBONATE 650 MG PO TABS
650.0000 mg | ORAL_TABLET | Freq: Two times a day (BID) | ORAL | Status: DC
  Administered 2021-05-06 – 2021-05-07 (×3): 650 mg via ORAL
  Filled 2021-05-06 (×4): qty 1

## 2021-05-06 MED ORDER — STERILE WATER FOR INJECTION IV SOLN
INTRAMUSCULAR | Status: DC
  Filled 2021-05-06 (×2): qty 1000
  Filled 2021-05-06 (×2): qty 150

## 2021-05-06 NOTE — Progress Notes (Signed)
PROGRESS NOTE    Cristina Morgan  CHE:[id number] DOB: 1934-08-20 DOA: 05/01/2021 PCP: Juline Patch, MD    Chief Complaint  Patient presents with   Fatigue    Brief Narrative:  Patient is a pleasant 87 year old female history of diabetes, hypertension, nontraumatic compression fractures brought to the ED due to altered mental status changes, increased lethargy, increased weakness, poor oral intake.  Patient noted to be hypercalcemic, CT chest with innumerable cortical lucencies throughout the ribs, thoracic spine, sternum worrisome for diffuse osseous metastases, lesions of multiple myeloma or diffuse metabolic bone disorder.  Patient placed on aggressive IV fluids, IV Lasix, calcitonin, Zometa.  Oncology consulted and multiple myeloma work-up underway.    Assessment & Plan:  Principal Problem:   Hypercalcemia Active Problems:   Acute metabolic encephalopathy   Type 2 diabetes mellitus (HCC)   Hypercholesterolemia   Essential hypertension   Pressure injury of skin   Vitamin B12 deficiency   Hypomagnesemia   Hypophosphatemia   SVT (supraventricular tachycardia) (HCC)   Hyponatremia    Assessment and Plan: * Hypercalcemia - Patient presented with lethargy, acute metabolic encephalopathy, noted to be hypercalcemic with CT scan findings with innumerable cortical lucencies throughout the ribs, thoracic spine, sternum worrisome for diffuse osseous metastases versus lesions of multiple myeloma or diffuse metabolic bone disorder. -PTH within normal limits with elevated intact calcium. -Magnesium level at 2.0 from 2.1 from 1.4 from 1.1, phosphorus level at 1.4 from 1.7 from 2.0 from 1.5 from 1.8. -Status post Zometa. -Status post calcitonin. -Continue IV fluids. -Discontinued IV Lasix. -Calcium levels improving. -Corrected calcium level at 8.72. -Replete electrolytes. -Bone skeletal survey with innumerable small lucent lesions throughout the skeleton and moth-eaten appearance to  the ribs and spine compatible with multiple myeloma.  Moderate L1 and severe L2 vertebral compression fractures.  Severe lower thoracic vertebral compression fracture.  Acute or early subacute lateral left fourth and fifth rib fractures, - multiple myeloma panel ordered and pending per oncology. -Kappa lambda light chain ratio elevated at 13.03. -Free kappa light chains elevated. -Per oncology patient will need a bone marrow biopsy which has been scheduled for today 05/06/2021 per IR.   -Oncology following and appreciate input and recommendations.  Acute metabolic encephalopathy - Likely secondary to hypercalcemia in the setting of dehydration. -No signs or symptoms of infection. -CT head with no acute intracranial abnormalities. -Improving clinically on a daily basis however not at baseline per family.    -Replete electrolytes, continue treatment for hypercalcemia.  -Continue IV fluids for another 24 hours. -Discontinued IV Lasix.  Hyponatremia - Patient noted with hyponatremia with sodium of 128 (2/18). -Likely secondary to diuretics which have been discontinued.  -Hyponatremia improving with hydration.  -Decreased IV fluid rate.  -Repeat labs in the AM.   SVT (supraventricular tachycardia) ([place]) - Patient noted to have some runs of SVT in the 140s to the 160s (05/02/2021) per RN and noted on telemetry -Likely secondary to electrolyte abnormalities as magnesium was noted at 1.1 and currently at 2.0 with phosphorus at 1.8 and currently at 1.7 -TSH within normal limits.   -2D echo with normal EF.NWMA.  -Replete electrolytes to keep magnesium > 2 and potassium > 4.  -Replete phosphorus. -Patient with borderline blood pressure and as such Coreg decreased to 3.125 mg twice daily.  -Continue current dose Coreg. -IV Lopressor as needed heart rate > 130  Hypophosphatemia- (present on admission) - PTH within normal limits. -Phosphorus at 1.4 -K-Phos 30 mmol IV x1. -Place on oral  phosphorus tablets in addition. -Repeat labs in the AM.  Hypomagnesemia- (present on admission) - Magnesium at 2.0 -Phosphorus at 1.7. -PTH within normal limits. -Repeat labs in the a.m.  Vitamin B12 deficiency- (present on admission) - Repeat vitamin B12 levels at 3868.   -Discontinued vitamin B12 supplementation.   -Outpatient follow-up.    Pressure injury of skin Pressure Injury 05/01/21 Coccyx Mid Stage 2 -  Partial thickness loss of dermis presenting as a shallow open injury with a red, pink wound bed without slough. small open area (Active)  05/01/21 1500  Location: Coccyx  Location Orientation: Mid  Staging: Stage 2 -  Partial thickness loss of dermis presenting as a shallow open injury with a red, pink wound bed without slough.  Wound Description (Comments): small open area  Present on Admission: Yes       Essential hypertension- (present on admission) - BP noted to be borderline on 05/04/2021. -Coreg decreased to 3.125 mg twice daily. -Continue Cozaar.  Hypercholesterolemia- (present on admission) - Statin.  Type 2 diabetes mellitus (HCC) - Hemoglobin A1c 5.9 (02/01/2021) -CBG 124 this morning. -Sliding scale insulin         DVT prophylaxis: Lovenox Code Status: Full  Family Communication: Updated patient and daughter and granddaughter at bedside Disposition: TBD, likely home with home health per family preference, early next week.  Status is: Inpatient Remains inpatient appropriate because: Severity of illness           Consultants:  Oncology: Dr. Rogue Bussing 05/01/2021 Interventional radiology: Candiss Norse, PA 05/03/2021  Procedures:  CT chest 05/01/2021 CT head 05/01/2021 Bone survey 05/02/2021 2D echo 05/03/2021  Antimicrobials:  None   Subjective: Patient sleeping but arousable.  Per family patient did not sleep much last night despite multiple medications and finally got some sleep this morning on Benadryl.  Patient denies any  chest pain.  No shortness of breath.  No abdominal pain.  Somewhat anxious about bone marrow procedure to be done today.   Objective: Vitals:   05/05/21 2022 05/06/21 0450 05/06/21 0633 05/06/21 0833  BP: (!) 162/73 (!) 170/64 (!) 158/85 (!) 160/58  Pulse: 78 80 80 75  Resp: _0 Temp: 98.6 F (37 C) 98.2 F (36.8 C)  98.3 F (36.8 C)  TempSrc:      SpO2: 96% 100%  97%  Weight:      Height:        Intake/Output Summary (Last 24 hours) at 05/06/2021 1027 Last data filed at 05/06/2021 0400 Gross per 24 hour  Intake 1467.26 ml  Output 450 ml  Net 1017.26 ml   Filed Weights   05/03/21 0426 05/03/21 0500 05/05/21 0500  Weight: 60.8 kg 60.8 kg 63.7 kg    Examination:  General exam: NAD. Respiratory system: CTA B anterior lung fields.  No wheezes, no crackles, no rhonchi.  Normal respiratory effort.  Speaking in full sentences.  Cardiovascular system: Regular rate rhythm no murmurs rubs or gallops.  No JVD.  No lower extremity edema.  Gastrointestinal system: Abdomen is soft, nontender, nondistended, positive bowel sounds.  No rebound.  No guarding. Central nervous system: Alert.  Less confused.  Moving extremities spontaneously.  No focal neurological deficits.  Extremities: Symmetric 5 x 5 power. Skin: No rashes, lesions or ulcers Psychiatry: Judgement and insight appear normal. Mood & affect appropriate.     Data Reviewed:   CBC: Recent Labs  Lab 05/01/21 0320 05/02/21 0609 05/03/21 0532 05/04/21 0430 05/05/21 0532 05/06/21 0435  WBC  6.7 12.2* 8.5 5.9 5.2 6.5  NEUTROABS 4.3  --  7.4 3.5  --  3.8  HGB 10.5* 10.5* 9.5* 9.4* 10.1* 10.4*  HCT 33.3* 32.9* 28.4* 28.2* 30.9* 31.9*  MCV 99.1 98.5 94.4 94.3 95.1 94.4  PLT 314 288 247 158 246 032    Basic Metabolic Panel: Recent Labs  Lab 05/02/21 0609 05/02/21 0823 05/03/21 0532 05/04/21 0430 05/05/21 0532 05/06/21 0435  NA 138  --  131* 128* 132* 130*  K 3.8  --  3.5 4.2 3.7 4.0  CL 106  --  103 105  108 105  CO2 24  --  22 17* 19* 18*  GLUCOSE 205*  --  178* 125* 110* 144*  BUN 14  --  _0 CREATININE 0.79  --  0.63 0.84 0.73 0.81  CALCIUM 10.8* 11.0* 9.1 7.9* 7.8* 8.0*  MG 1.1*  --  1.4* 2.1 2.0 2.0  PHOS 1.8*  --  1.5* 2.0* 1.7* 1.4*    GFR: Estimated Creatinine Clearance: 49.4 mL/min (by C-G formula based on SCr of 0.81 mg/dL).  Liver Function Tests: Recent Labs  Lab 05/01/21 0320 05/03/21 0532 05/04/21 0430 05/05/21 0532 05/06/21 0435  AST 18  --   --   --   --   ALT 12  --   --   --   --   ALKPHOS 88  --   --   --   --   BILITOT 0.7  --   --   --   --   PROT 7.8  --   --   --   --   ALBUMIN 3.8 3.1* 2.8* 3.1* 3.1*    CBG: Recent Labs  Lab 05/05/21 1600 05/05/21 2026 05/06/21 0019 05/06/21 0419 05/06/21 0811  GLUCAP 173* 78 150* 124* 113*     Recent Results (from the past 240 hour(s))  Resp Panel by RT-PCR (Flu A&B, Covid) Nasopharyngeal Swab     Status: None   Collection Time: 05/01/21  3:50 AM   Specimen: Nasopharyngeal Swab; Nasopharyngeal(NP) swabs in vial transport medium  Result Value Ref Range Status   SARS Coronavirus 2 by RT PCR NEGATIVE NEGATIVE Final    Comment: (NOTE) SARS-CoV-2 target nucleic acids are NOT DETECTED.  The SARS-CoV-2 RNA is generally detectable in upper respiratory specimens during the acute phase of infection. The lowest concentration of SARS-CoV-2 viral copies this assay can detect is 138 copies/mL. A negative result does not preclude SARS-Cov-2 infection and should not be used as the sole basis for treatment or other patient management decisions. A negative result may occur with  improper specimen collection/handling, submission of specimen other than nasopharyngeal swab, presence of viral mutation(s) within the areas targeted by this assay, and inadequate number of viral copies(<138 copies/mL). A negative result must be combined with clinical observations, patient history, and epidemiological information.  The expected result is Negative.  Fact Sheet for Patients:  EntrepreneurPulse.com.au  Fact Sheet for Healthcare Providers:  IncredibleEmployment.be  This test is no t yet approved or cleared by the [place] FDA and  has been authorized for detection and/or diagnosis of SARS-CoV-2 by FDA under an Emergency Use Authorization (EUA). This EUA will remain  in effect (meaning this test can be used) for the duration of the COVID-19 declaration under Section 564(b)(1) of the Act, 21 U.S.C.section 360bbb-3(b)(1), unless the authorization is terminated  or revoked sooner.       Influenza A by PCR NEGATIVE NEGATIVE Final  Influenza B by PCR NEGATIVE NEGATIVE Final    Comment: (NOTE) The Xpert Xpress SARS-CoV-2/FLU/RSV plus assay is intended as an aid in the diagnosis of influenza from Nasopharyngeal swab specimens and should not be used as a sole basis for treatment. Nasal washings and aspirates are unacceptable for Xpert Xpress SARS-CoV-2/FLU/RSV testing.  Fact Sheet for Patients: EntrepreneurPulse.com.au  Fact Sheet for Healthcare Providers: IncredibleEmployment.be  This test is not yet approved or cleared by the [place] FDA and has been authorized for detection and/or diagnosis of SARS-CoV-2 by FDA under an Emergency Use Authorization (EUA). This EUA will remain in effect (meaning this test can be used) for the duration of the COVID-19 declaration under Section 564(b)(1) of the Act, 21 U.S.C. section 360bbb-3(b)(1), unless the authorization is terminated or revoked.  Performed at [facility], [address] [place], [place] [address]          Radiology Studies: No results found.      Scheduled Meds:  aspirin  81 mg Oral Daily   carvedilol  3.125 mg Oral BID WC   cholecalciferol  1,000 Units Oral Daily   enoxaparin (LOVENOX) injection  40 mg Subcutaneous Q24H   feeding  supplement  237 mL Oral BID BM   fentaNYL       heparin lock flush       insulin aspart  0-15 Units Subcutaneous Q4H   losartan  25 mg Oral Daily   magnesium oxide  400 mg Oral BID   midazolam       multivitamin with minerals  1 tablet Oral Daily   omega-3 acid ethyl esters  1 capsule Oral Daily   pantoprazole  40 mg Oral Daily   phosphorus  250 mg Oral BID   pravastatin  40 mg Oral q1800   sertraline  25 mg Oral Daily   sodium bicarbonate  650 mg Oral BID   Continuous Infusions:  sodium chloride 125 mL/hr at 05/06/21 5038   potassium PHOSPHATE IVPB (in mmol)       LOS: 5 days    Time spent: 40 minutes    Irine Seal, MD [facility]   To contact the attending provider between 7A-7P or the covering provider during after hours 7P-7A, please log into the web site [email or web address] and access using universal [place] password for that web site. If you do not have the password, please call the hospital operator.  05/06/2021, 10:27 AM

## 2021-05-06 NOTE — Progress Notes (Signed)
Cristina Morgan   DOB:12/08/1934   ZO#:[id number]    Subjective: No acute events overnight.  Patient more lucid.  Grand -daughter was at bedside.  Awaiting a bone marrow biopsy this morning.  Denies any pain.  Objective:  Vitals:   05/06/21 1113 05/06/21 1559  BP: 133/74 (!) 144/59  Pulse: 74 70  Resp: 20 16  Temp:  98.5 F (36.9 C)  SpO2: 96% 99%     Intake/Output Summary (Last 24 hours) at 05/06/2021 1918 Last data filed at 05/06/2021 1500 Gross per 24 hour  Intake 1733.44 ml  Output 850 ml  Net 883.44 ml   Elderly cachectic appearing female patient.  Alert oriented x2-3.    Physical Exam Vitals and nursing note reviewed.  HENT:     Head: Normocephalic and atraumatic.     Mouth/Throat:     Pharynx: Oropharynx is clear.  Eyes:     Extraocular Movements: Extraocular movements intact.     Pupils: Pupils are equal, round, and reactive to light.  Cardiovascular:     Rate and Rhythm: Normal rate and regular rhythm.  Pulmonary:     Comments: Decreased breath sounds bilaterally.  Abdominal:     Palpations: Abdomen is soft.  Musculoskeletal:        General: Normal range of motion.     Cervical back: Normal range of motion.  Skin:    General: Skin is warm.  Neurological:     General: No focal deficit present.     Mental Status: She is alert.     Labs:  Lab Results  Component Value Date   WBC 6.5 05/06/2021   HGB 10.4 (L) 05/06/2021   HCT 31.9 (L) 05/06/2021   MCV 94.4 05/06/2021   PLT 252 05/06/2021   NEUTROABS 3.8 05/06/2021    Lab Results  Component Value Date   NA 130 (L) 05/06/2021   K 4.0 05/06/2021   CL 105 05/06/2021   CO2 18 (L) 05/06/2021    Studies:  CT BONE MARROW BIOPSY  Result Date: 05/06/2021 INDICATION: 87 year old female with history of hypercalcemia of malignancy presenting for bone marrow biopsy. EXAM: CT-GUIDED BONE MARROW BIOPSY AND ASPIRATION MEDICATIONS: None ANESTHESIA/SEDATION: Fentanyl 25 mcg IV; Versed 0 mg IV Sedation Time: 0  minutes; The patient was continuously monitored during the procedure by the interventional radiology nurse under my direct supervision. COMPLICATIONS: None immediate. PROCEDURE: Informed consent was obtained from the patient following an explanation of the procedure, risks, benefits and alternatives. The patient understands, agrees and consents for the procedure. All questions were addressed. A time out was performed prior to the initiation of the procedure. The patient was positioned prone and non-contrast localization CT was performed of the pelvis to demonstrate the iliac marrow spaces. The operative site was prepped and draped in the usual sterile fashion. Under sterile conditions and local anesthesia, a 22 gauge spinal needle was utilized for procedural planning. Next, an 11 gauge coaxial bone biopsy needle was advanced into the right iliac marrow space. Needle position was confirmed with CT imaging. Initially, a bone marrow aspiration was performed. Next, a bone marrow biopsy was obtained with the 11 gauge outer bone marrow device. Samples were prepared with the cytotechnologist and deemed adequate. The needle was removed and superficial hemostasis was obtained with manual compression. A dressing was applied. The patient tolerated the procedure well without immediate post procedural complication. IMPRESSION: Successful CT guided right iliac bone marrow aspiration and core biopsy. Ruthann Cancer, MD [facility]  [facility] Radiology Electronically Signed   By: Ruthann Cancer M.D.   On: 05/06/2021 31:8     #87 year old female patient with no prior history of malignancy-is currently admitted to hospital for mental status changes/hypercalcemia calcium 11.1; incidentally on CT scan.  Patient noted to have multiple lytic lesions in the bone.    #Hypercalcemia-calcium 11.2 on admission.  CT chest multiple bone lesions-lytic in the bone; mild anemia; however normal renal  function-concerning for underlying malignancy like multiple myeloma versus . Metastatic bone survey is consistent with multiple lytic lesion concerning for multiple myeloma.  Multiple myeloma panel -2.2 g/dL immunofixation shows IgG monoclonal protein with kappa light chain. kappa/lambda light chain ratio elevated 12.  calcium -normal.   S/p Zometa on 2/15.  Awaiting bone marrow biopsy this morning.    #Mental status changes likely secondary to hypercalcemia; CT head noncontrast negative-significant improvement noted overall.  #Multiple thoracic fractures [followed by Dr.Cook at [facility]   #Electrolyte abnormalities-hypomagnesemia/hypophosphatemia-as per primary team.    #Bone marrow biopsy will probably take 5 to 10 days for the results.  Plan outpatient follow-up to discuss treatment options; plan of care.  Cammie Sickle, MD 05/06/2021  7:18 PM

## 2021-05-06 NOTE — Care Management Important Message (Signed)
Important Message  Patient Details  Name: Cristina Morgan MRN: [id number] Date of Birth: June 11, 1934   Medicare Important Message Given:  Yes  Reviewed with the daughter in the room.   Juliann Pulse A Careena Degraffenreid 05/06/2021, 3:29 PM

## 2021-05-06 NOTE — TOC Initial Note (Signed)
Transition of Care [facility]) - Initial/Assessment Note    Patient Details  Name: Cristina Morgan MRN: [id number] Date of Birth: [birth year]  Transition of [facility] CM/SW Contact:    Eileen Stanford, LCSW Phone Number: 05/06/2021, 9:54 AM  Clinical Narrative:   CSW spoke with pt's daughter [name]. Per pt's daughter they are wanting to take pt home- declining SNF. Pt lives with daughter, daughters husband, and granddaughter. Per pt's daughter someone is at home with pt 24/7. Pt's daughter states she has a walker, and 3n1 at home. Pt's daughter is requesting a Corporate investment banker. Pt's daughter is agreeable to [facility] however with no agency preference. Referral given to [facility].              Expected Discharge Plan: [place] Barriers to Discharge: No Barriers Identified   Patient Goals and CMS Choice Patient states their goals for this hospitalization and ongoing recovery are:: per daughter- for pt to return home   Choice offered to / list presented to : Adult Children  Expected Discharge Plan and Services Expected Discharge Plan: [place] In-house Referral: Clinical Social Work   Post Acute Care Choice: [place] arrangements for the past 2 months: [place]: PT, OT [place] Agency: [place] ([facility]) Date [place]: 05/06/21 Time [place]: [phone] Representative spoke with at [place]: [place] Arrangements/Services Living arrangements for the past 2 months: [place] with:: Adult Children, Relatives Patient language and need for interpreter reviewed:: Yes Do you feel safe going back to the place where you live?: Yes      Need for Family Participation in Patient Care: Yes (Comment) Care giver support system in place?: Yes (comment)   Criminal Activity/Legal Involvement Pertinent to Current Situation/Hospitalization: No - Comment as  needed  Activities of Daily Living Home Assistive Devices/Equipment: Eyeglasses, Dentures (specify type), CBG Meter ADL Screening (condition at time of admission) Patient's cognitive ability adequate to safely complete daily activities?: Yes Is the patient deaf or have difficulty hearing?: No Does the patient have difficulty concentrating, remembering, or making decisions?: Yes Patient able to express need for assistance with ADLs?: Yes Does the patient have difficulty dressing or bathing?: No Independently performs ADLs?: Yes (appropriate for developmental age) Does the patient have difficulty walking or climbing stairs?: No Weakness of Legs: None Weakness of Arms/Hands: None  Permission Sought/Granted Permission sought to share information with : Family Supports Permission granted to share information with : Yes, Release of Information Signed  Share Information with NAME: [name]     Permission granted to share info w Relationship: daughter     Emotional Assessment Appearance:: Appears stated age Attitude/Demeanor/Rapport: Unable to Assess Affect (typically observed): Unable to Assess Orientation: : Oriented to Self, Oriented to  Time Alcohol / Substance Use: Not Applicable Psych Involvement: No (comment)  Admission diagnosis:  Hypercalcemia [E83.52] Altered mental status [R41.82] Altered mental status, unspecified altered mental status type [R41.82] Patient Active Problem List   Diagnosis Date Noted   Hyponatremia 05/04/2021   Pressure injury of skin 05/02/2021   Vitamin B12 deficiency 05/02/2021   Hypomagnesemia 05/02/2021   Hypophosphatemia 05/02/2021   SVT (supraventricular tachycardia) ([place]) 05/02/2021   Altered mental status 71/69/6789   Acute metabolic encephalopathy 38/12/1749   Hypercalcemia 05/01/2021  Closed compression fracture of second lumbar vertebra ([place]) 10/28/2020   Need for vaccination against Streptococcus pneumoniae using pneumococcal conjugate  vaccine 13 01/07/2017   Primary osteoarthritis of right knee 12/08/2016   PNA (pneumonia) 07/09/2015   Acute chest pain 03/20/2014   Type 2 diabetes mellitus ([place]) 03/20/2014   Hypercholesterolemia 03/20/2014   Essential hypertension 03/20/2014   PCP:  Juline Patch, MD Pharmacy:   [facility] - [place], [place] - [address] [place] [place] [address] Phone: [phone] Fax: [phone]  [place], [place] - [place] [address] [place] [address] Phone: [phone] Fax: [phone]     Social Determinants of Health (SDOH) Interventions    Readmission Risk Interventions No flowsheet data found.

## 2021-05-06 NOTE — Procedures (Signed)
Interventional Radiology Procedure Note  Procedure: CT guided aspirate and core biopsy of right iliac bone  Complications: None  Recommendations: - Bedrest supine x 1 hrs - PRN  Pain - Follow biopsy results   Ruthann Cancer, MD

## 2021-05-06 NOTE — Progress Notes (Signed)
OT Cancellation Note  Patient Details Name: Cristina Morgan MRN: [id number] DOB: 12-24-1934   Cancelled Treatment:    Reason Eval/Treat Not Completed: Fatigue/lethargy limiting ability to participate. RN reports pt is very lethargic after biopsy today. Family requesting therapy to hold to allow pt to rest. OT will re-attempt when pt is able to actively participate in OT intervention.      Darleen Crocker, [place], OTR/L , CBIS ascom [phone]  05/06/21, 3:07 PM

## 2021-05-06 NOTE — Plan of Care (Signed)
Patient awake most of the night. Aox2. NPO since midnight. Family at bedside.   Problem: Education: Goal: Knowledge of General Education information will improve Description: Including pain rating scale, medication(s)/side effects and non-pharmacologic comfort measures Outcome: Progressing   Problem: Health Behavior/Discharge Planning: Goal: Ability to manage health-related needs will improve Outcome: Progressing   Problem: Clinical Measurements: Goal: Ability to maintain clinical measurements within normal limits will improve Outcome: Progressing Goal: Will remain free from infection Outcome: Progressing Goal: Diagnostic test results will improve Outcome: Progressing Goal: Respiratory complications will improve Outcome: Progressing Goal: Cardiovascular complication will be avoided Outcome: Progressing   Problem: Activity: Goal: Risk for activity intolerance will decrease Outcome: Progressing   Problem: Nutrition: Goal: Adequate nutrition will be maintained Outcome: Progressing   Problem: Coping: Goal: Level of anxiety will decrease Outcome: Progressing   Problem: Elimination: Goal: Will not experience complications related to bowel motility Outcome: Progressing Goal: Will not experience complications related to urinary retention Outcome: Progressing   Problem: Pain Managment: Goal: General experience of comfort will improve Outcome: Progressing   Problem: Safety: Goal: Ability to remain free from injury will improve Outcome: Progressing   Problem: Skin Integrity: Goal: Risk for impaired skin integrity will decrease Outcome: Progressing

## 2021-05-06 NOTE — Progress Notes (Signed)
PT Cancellation Note  Patient Details Name: Cristina Morgan MRN: [id number] DOB: 11-Jan-1935   Cancelled Treatment:    Reason Eval/Treat Not Completed: Other (comment). Therapy session attempted. Pt sleeping soundly. Family in room reports she just returned from biopsy and was heavily medicated. Requests that therapy hold off this date to let patient rest as she had a difficulty night. Requests therapy return tomorrow. Will re-attempt next date.   Labria Wos 05/06/2021, 2:05 PM Greggory Stallion, PT, DPT, [facility] [phone]

## 2021-05-07 ENCOUNTER — Telehealth: Payer: Self-pay

## 2021-05-07 ENCOUNTER — Telehealth: Payer: Self-pay | Admitting: Internal Medicine

## 2021-05-07 DIAGNOSIS — E871 Hypo-osmolality and hyponatremia: Secondary | ICD-10-CM

## 2021-05-07 DIAGNOSIS — L89152 Pressure ulcer of sacral region, stage 2: Secondary | ICD-10-CM | POA: Diagnosis present

## 2021-05-07 LAB — RENAL FUNCTION PANEL
Albumin: 2.4 g/dL — ABNORMAL LOW (ref 3.5–5.0)
Anion gap: 5 (ref 5–15)
BUN: 18 mg/dL (ref 8–23)
CO2: 22 mmol/L (ref 22–32)
Calcium: 7 mg/dL — ABNORMAL LOW (ref 8.9–10.3)
Chloride: 106 mmol/L (ref 98–111)
Creatinine, Ser: 1.1 mg/dL — ABNORMAL HIGH (ref 0.44–1.00)
GFR, Estimated: 49 mL/min — ABNORMAL LOW (ref >60)
Glucose, Bld: 80 mg/dL (ref 70–99)
Phosphorus: 2.7 mg/dL (ref 2.5–4.6)
Potassium: 3.6 mmol/L (ref 3.5–5.1)
Sodium: 133 mmol/L — ABNORMAL LOW (ref 135–145)

## 2021-05-07 LAB — CBC
HCT: 26 % — ABNORMAL LOW (ref 36.0–46.0)
Hemoglobin: 8.6 g/dL — ABNORMAL LOW (ref 12.0–15.0)
MCH: 31.3 pg (ref 26.0–34.0)
MCHC: 33.1 g/dL (ref 30.0–36.0)
MCV: 94.5 fL (ref 80.0–100.0)
Platelets: 220 10*3/uL (ref 150–400)
RBC: 2.75 MIL/uL — ABNORMAL LOW (ref 3.87–5.11)
RDW: 13.2 % (ref 11.5–15.5)
WBC: 6.9 10*3/uL (ref 4.0–10.5)
nRBC: 0 % (ref 0.0–0.2)

## 2021-05-07 LAB — PTH-RELATED PEPTIDE: PTH-related peptide: <2 pmol/L

## 2021-05-07 LAB — GLUCOSE, CAPILLARY
Glucose-Capillary: 123 mg/dL — ABNORMAL HIGH (ref 70–99)
Glucose-Capillary: 146 mg/dL — ABNORMAL HIGH (ref 70–99)
Glucose-Capillary: 189 mg/dL — ABNORMAL HIGH (ref 70–99)
Glucose-Capillary: 64 mg/dL — ABNORMAL LOW (ref 70–99)
Glucose-Capillary: 92 mg/dL (ref 70–99)

## 2021-05-07 MED ORDER — CARVEDILOL 3.125 MG PO TABS: 3.1250 mg | ORAL_TABLET | Freq: Two times a day (BID) | ORAL | 1 refills | Status: DC

## 2021-05-07 MED ORDER — ONDANSETRON HCL 4 MG PO TABS: 4.0000 mg | ORAL_TABLET | Freq: Four times a day (QID) | ORAL | 0 refills | Status: DC | PRN

## 2021-05-07 MED ORDER — ENSURE ENLIVE PO LIQD: 237.0000 mL | Freq: Two times a day (BID) | ORAL | 12 refills | Status: DC

## 2021-05-07 MED ORDER — POTASSIUM CHLORIDE CRYS ER 10 MEQ PO TBCR
40.0000 meq | EXTENDED_RELEASE_TABLET | Freq: Once | ORAL | Status: AC
  Administered 2021-05-07: 40 meq via ORAL
  Filled 2021-05-07: qty 4

## 2021-05-07 MED ORDER — DEXAMETHASONE 4 MG PO TABS
8.0000 mg | ORAL_TABLET | Freq: Every day | ORAL | Status: DC
  Administered 2021-05-07: 8 mg via ORAL
  Filled 2021-05-07: qty 2

## 2021-05-07 MED ORDER — DEXAMETHASONE 4 MG PO TABS: 8.0000 mg | ORAL_TABLET | Freq: Every day | ORAL | 0 refills | Status: AC

## 2021-05-07 MED ORDER — SODIUM BICARBONATE 650 MG PO TABS
650.0000 mg | ORAL_TABLET | Freq: Two times a day (BID) | ORAL | 0 refills | Status: AC
Start: 2021-05-07 — End: 2021-05-09

## 2021-05-07 MED ORDER — K PHOS MONO-SOD PHOS DI & MONO 155-852-130 MG PO TABS: 250.0000 mg | ORAL_TABLET | Freq: Two times a day (BID) | ORAL | 0 refills | Status: AC

## 2021-05-07 NOTE — Progress Notes (Signed)
Physical Therapy Treatment Patient Details Name: Cristina Morgan MRN: [id number] DOB: [birth year] Today's Date: 05/07/2021   History of Present Illness 87 y/o F with PMH: DM and HTN who was admitted d/t mental status changes. She was found to have hypercalcemia and incidentally on CT scan, lytic lesions to the bone per oncology note. Metastatic bone survey reports: "Innumerable small lucent lesions throughout the skeleton and moth  eaten appearance to the ribs and spine, compatible with multiple  myeloma." Pt being seen by Dr. Lacinda Axon with neurosurgery OP And is being managed conservatively.    PT Comments    Pt showed good effort t/o session, though initially she indicated that she was tired and did not expect to be able to do a lot.  Ultimately she showed ability to do some supine exercises - mostly with AAROM but some even with resistance.  Similarly she initially asked for a lot of assist with bed mobility but with cuing and encouragement she was able to do some of the mobility with only relatively light assist.  Most notedly she was able to circumambulate the nurses' station with slow but steady gait.  Her O2 remained in the 90s (on room air) and HR 70-80s t/o the effort; she did need regular cuing/reminders for walker use/positioning, posture, UE use, etc but overall did well and did much more than she (or both daughter and PT) expected today showing better ability to safely go home - where she will have 24/7 assistance.  Recommendations for follow up therapy are one component of a multi-disciplinary discharge planning process, led by the attending physician.  Recommendations may be updated based on patient status, additional functional criteria and insurance authorization.  Follow Up Recommendations  Home health PT     Assistance Recommended at Discharge Frequent or constant Supervision/Assistance  Patient can return home with the following A little help with walking and/or transfers;A little  help with bathing/dressing/bathroom;Assist for transportation   Equipment Recommendations  BSC/3in1    Recommendations for Other Services       Precautions / Restrictions Precautions Precautions: Back;Fall Restrictions Weight Bearing Restrictions: No Other Position/Activity Restrictions: Oncology note indicates that pt sees Dr. Lacinda Axon OP for spine and has been managed with a brace. Upon chart review in Care Everywhere, under plan from Dr. Remo Lipps Cook's note from 11/12/20 indicates "brace is prescribed for stability for ambulation, aid in healing and pain control". Pt's daughter who is present in room is agreeable to bringing in brace on 05/03/21. Per Dr. Grandville Silos - try to mobilize pt with brace on, if she cannot tolerate it, it's okay to mobilize her without brace (via secure chat) 05/03/21.     Mobility  Bed Mobility Overal bed mobility: Needs Assistance Bed Mobility: Rolling, Sidelying to Sit, Sit to Sidelying Rolling: Min assist Sidelying to sit: Mod assist       General bed mobility comments: further education on log roll and UE use during transitions to sitting EOB, needed assist that was not excessive    Transfers Overall transfer level: Needs assistance Equipment used: Rolling walker (2 wheels) Transfers: Sit to/from Stand Sit to Stand: Mod assist           General transfer comment: Pt struggled with standing from standard height surface, unable to initiate lift off and needing consistent assist to shift weight forward and up; on second attempt, from higher (~2") surface she was able to rise with just min assist but plenty of cuing and guidance    Ambulation/Gait Ambulation/Gait  assistance: Min Web designer (Feet): 200 Feet Assistive device: Rolling walker (2 wheels)         General Gait Details: Pt with slow and labored effort but was able to circumambulate the nurses' station with O2 staying in ghe 90s on room air and HR staying in the 70s-80s t/o.  She  did need consistent cues to insure she stayed withing the walker (especially during turns) but overall surprised herself (and daughter as well as this therapist) as she had not walked more than a few feet in the room for the last week.  Pt with slow labored cadence and subjective fatigue but ultimately did not have any LOBs, though consistent cuing for knee extension, walker positioning, posture.   Stairs             Wheelchair Mobility    Modified Rankin (Stroke Patients Only)       Balance Overall balance assessment: Needs assistance Sitting-balance support: Feet supported Sitting balance-Leahy Scale: Good     Standing balance support: Bilateral upper extremity supported, Reliant on assistive device for balance Standing balance-Leahy Scale: Fair Standing balance comment: consistently getting outside BOS of walker but did imptove this with consistent VCs during ambulaiton, no overt LOBs but close CGA t/o the standing/walking effort                            Cognition Arousal/Alertness: Awake/alert Behavior During Therapy: WFL for tasks assessed/performed Overall Cognitive Status: Within Functional Limits for tasks assessed                                          Exercises General Exercises - Lower Extremity Ankle Circles/Pumps: AROM, 10 reps Heel Slides: Strengthening, 10 reps (with resisted leg ext) Hip ABduction/ADduction: AAROM, 10 reps Straight Leg Raises: AAROM, 10 reps    General Comments        Pertinent Vitals/Pain Pain Assessment Pain Assessment: Faces Faces Pain Scale: Hurts little more Pain Location: back    Home Living                          Prior Function            PT Goals (current goals can now be found in the care plan section) Progress towards PT goals: Progressing toward goals    Frequency    Min 2X/week      PT Plan Discharge plan needs to be updated    Co-evaluation               AM-PAC PT "6 Clicks" Mobility   Outcome Measure  Help needed turning from your back to your side while in a flat bed without using bedrails?: A Little Help needed moving from lying on your back to sitting on the side of a flat bed without using bedrails?: A Lot Help needed moving to and from a bed to a chair (including a wheelchair)?: A Lot Help needed standing up from a chair using your arms (e.g., wheelchair or bedside chair)?: A Little Help needed to walk in hospital room?: A Little Help needed climbing 3-5 steps with a railing? : A Lot 6 Click Score: 15    End of Session Equipment Utilized During Treatment: Gait belt;Back brace Activity Tolerance: Patient tolerated treatment well Patient left: with chair alarm  set;with family/visitor present;with call bell/phone within reach Nurse Communication: Mobility status PT Visit Diagnosis: Unsteadiness on feet (R26.81);Muscle weakness (generalized) (M62.81);Difficulty in walking, not elsewhere classified (R26.2)     Time: 9983-3825 PT Time Calculation (min) (ACUTE ONLY): 56 min  Charges:  $Gait Training: 23-37 mins $Therapeutic Exercise: 8-22 mins $Therapeutic Activity: 8-22 mins                     Kreg Shropshire, DPT 05/07/2021, 3:19 PM

## 2021-05-07 NOTE — Discharge Summary (Signed)
Physician Discharge Summary  BERNISE SYLVAIN YKZ:[id number] DOB: Apr 29, 1934 DOA: 05/01/2021  PCP: Juline Patch, MD  Admit date: 05/01/2021 Discharge date: 05/07/2021  Time spent: 60 minutes  Recommendations for Outpatient Follow-up:  Follow-up with Dr. Rogue Bussing in 1 to 2 weeks for follow-up on bone marrow biopsy, further management of probable multiple myeloma. Follow-up with Juline Patch, MD in 2 weeks.  On follow-up patient will need a basic metabolic profile, magnesium level, phosphorus level checked to follow-up on electrolytes and renal function.  SVT will need to be followed up upon.  Patient blood pressure will also need to be followed up upon.  Patient's diabetes will need to be followed up upon.    Discharge Diagnoses:  Principal Problem:   Hypercalcemia Active Problems:   Acute metabolic encephalopathy   Type 2 diabetes mellitus (HCC)   Hypercholesterolemia   Essential hypertension   Pressure injury of skin   Vitamin B12 deficiency   Hypomagnesemia   Hypophosphatemia   SVT (supraventricular tachycardia) (HCC)   Hyponatremia   Pressure injury of coccygeal region, stage 2 ([place])   Discharge Condition: Stable and improved  Diet recommendation: Carb modified diet/dysphagia 3 diet with thin liquids.  Filed Weights   05/03/21 0500 05/05/21 0500 05/07/21 0336  Weight: 60.8 kg 63.7 kg 63.3 kg    History of present illness:  HPI per Dr. Sim Boast is a 87 y.o. female with medical history significant for diabetes mellitus, hypertension, nontraumatic compression fractures who was brought into the ER by EMS for evaluation of mental status changes, increased lethargy, increased weakness and poor oral intake. Most of the history was obtained from the patient's daughter who was at the bedside and she states that her mother was in her usual state of health until 3 days ago when she developed weakness which has progressively worsened and on the day of admission  became very lethargic.  She has had multiple episodes of nausea, vomiting and poor oral intake but has not complained of any pain. I am unable to do review of systems on this patient.  Hospital Course:   Assessment and Plan: * Hypercalcemia - Patient presented with lethargy, acute metabolic encephalopathy, noted to be hypercalcemic with CT scan findings with innumerable cortical lucencies throughout the ribs, thoracic spine, sternum worrisome for diffuse osseous metastases versus lesions of multiple myeloma or diffuse metabolic bone disorder. -PTH within normal limits with elevated intact calcium. -Magnesium level at 2.0 from 2.1 from 1.4 from 1.1, phosphorus level at 1.4 from 1.7 from 2.0 from 1.5 from 1.8. -Status post Zometa. -Status post calcitonin. -Patient initially placed on IV fluids and IV Lasix. -IV Lasix subsequently discontinued and patient maintained on IV fluids. -Hypercalcemia corrected and had resolved by day of discharge. -Electrolytes repleted. -Oncology consulted and followed the patient throughout the hospitalization. -Bone skeletal survey with innumerable small lucent lesions throughout the skeleton and moth-eaten appearance to the ribs and spine compatible with multiple myeloma.  Moderate L1 and severe L2 vertebral compression fractures.  Severe lower thoracic vertebral compression fracture.  Acute or early subacute lateral left fourth and fifth rib fractures, - multiple myeloma panel ordered and -2.2 g/dL immunofixation showed IgG monoclonal protein with kappa light chain.  -Kappa lambda light chain ratio elevated at 13.03. -Free kappa light chains elevated. -Per oncology patient will need a bone marrow biopsy which was done on 05/06/2021 per IR.   -Patient subsequently started on Decadron 8 mg daily x4 days on day of discharge. -Outpatient  follow-up with oncology for bone marrow biopsy results and further management.  Acute metabolic encephalopathy - Likely secondary  to hypercalcemia in the setting of dehydration. -No signs or symptoms of infection. -CT head with no acute intracranial abnormalities. -Patient improved clinically during the hospitalization.   -Electrolytes were repleted as well as treatment for hypercalcemia.   -IV Lasix discontinued.   -Patient was discharged home in stable and improved condition.    Pressure injury of coccygeal region, stage 2 ([place])- (present on admission) Pressure Injury 05/01/21 Coccyx Mid Stage 2 -  Partial thickness loss of dermis presenting as a shallow open injury with a red, pink wound bed without slough. small open area (Active)  05/01/21 1500  Location: Coccyx  Location Orientation: Mid  Staging: Stage 2 -  Partial thickness loss of dermis presenting as a shallow open injury with a red, pink wound bed without slough.  Wound Description (Comments): small open area  Present on Admission: Yes       Hyponatremia - Patient noted with hyponatremia with sodium of 128 (2/18). -Likely secondary to diuretics which have been discontinued.  -Hyponatremia improved with hydration.  -Outpatient follow-up with PCP.    SVT (supraventricular tachycardia) ([place]) - Patient noted to have some runs of SVT in the 140s to the 160s (05/02/2021) per RN and noted on telemetry -Likely secondary to electrolyte abnormalities as magnesium was noted at 1.1 and currently at 2.0 with phosphorus at 1.8 and currently at 1.7 -TSH within normal limits.   -2D echo with normal EF.NWMA.  -Electrolytes were repleted.   -Patient started on low-dose Coreg.   -Patient had no further bouts of SVT during the hospitalization.  -Outpatient follow-up with PCP.   Hypophosphatemia- (present on admission) - PTH within normal limits. -Phosphorus levels was repleted during the hospitalization.  -Patient was discharged home on 3 days of oral phosphorus supplementation.   -Outpatient follow-up with PCP.  Hypomagnesemia- (present on admission) - Repleted  during the hospitalization.  -Outpatient follow-up with PCP.   Vitamin B12 deficiency- (present on admission) - Repeat vitamin B12 levels at 3868.   -Discontinued vitamin B12 supplementation.   -Outpatient follow-up.    Pressure injury of skin Pressure Injury 05/01/21 Coccyx Mid Stage 2 -  Partial thickness loss of dermis presenting as a shallow open injury with a red, pink wound bed without slough. small open area (Active)  05/01/21 1500  Location: Coccyx  Location Orientation: Mid  Staging: Stage 2 -  Partial thickness loss of dermis presenting as a shallow open injury with a red, pink wound bed without slough.  Wound Description (Comments): small open area  Present on Admission: Yes       Essential hypertension- (present on admission) - BP noted to be borderline on 05/04/2021. -Coreg decreased to 3.125 mg twice daily. -Patient maintained on home regimen Cozaar. -Outpatient follow-up with PCP.  Hypercholesterolemia- (present on admission) - Patient maintained on home regimen statin.  Type 2 diabetes mellitus (HCC) - Hemoglobin A1c 5.9 (02/01/2021) -Patient's oral hypoglycemic agents were held during the hospitalization patient maintained on sliding scale insulin.   -Oral hypoglycemic agents will be resumed on discharge.   -Patient instructed to check blood glucose levels 3 times daily with meals as patient being discharged on Decadron.        Procedures: CT chest 05/01/2021 CT head 05/01/2021 Bone survey 05/02/2021 2D echo 05/03/2021 Bone marrow biopsy per IR, Dr. Serafina Royals 05/06/2021  Consultations: Oncology: Dr. Rogue Bussing 05/01/2021 Interventional radiology: Candiss Norse, PA 05/03/2021  Discharge Exam: Vitals:   05/07/21 0752 05/07/21 1113  BP: (!) 137/59 (!) 114/45  Pulse: 76 72  Resp: 16 16  Temp: 98.5 F (36.9 C) 98.9 F (37.2 C)  SpO2: 95% 98%    General: NAD. Cardiovascular: Regular rate rhythm no murmurs rubs or gallops.  No JVD.  No lower  extremity edema. Respiratory: Clear to auscultation bilaterally anterior lung fields.  No wheezes, no crackles, no rhonchi.  Normal respiratory effort.  Discharge Instructions   Discharge Instructions     Diet Carb Modified   Complete by: As directed    Dysphagia 3 diet with thin liquids.   Discharge instructions   Complete by: As directed    Please check blood sugars 3 times daily with meals.   Discharge wound care:   Complete by: As directed    As above.   Increase activity slowly   Complete by: As directed       Allergies as of 05/07/2021       Reactions   Latex Rash   Penicillins Other (See Comments)        Medication List     STOP taking these medications    lisinopril 10 MG tablet Commonly known as: ZESTRIL       TAKE these medications    acetaminophen 325 MG tablet Commonly known as: TYLENOL Take 650 mg by mouth every 6 (six) hours as needed.   alendronate 70 MG tablet Commonly known as: FOSAMAX Take 1 tablet (70 mg total) by mouth every 7 (seven) days. Take with a full glass of water on an empty stomach.   aspirin 81 MG chewable tablet Chew 1 tablet (81 mg total) by mouth daily.   baclofen 20 MG tablet Commonly known as: LIORESAL TAKE ONE TABLET BY MOUTH THREE times daily   CALCIUM 1200 PO Take 1 capsule by mouth daily.   carvedilol 3.125 MG tablet Commonly known as: COREG Take 1 tablet (3.125 mg total) by mouth 2 (two) times daily with a meal.   cholecalciferol 25 MCG (1000 UNIT) tablet Commonly known as: VITAMIN D3 Take 1,000 Units by mouth daily.   dexamethasone 4 MG tablet Commonly known as: DECADRON Take 2 tablets (8 mg total) by mouth daily for 4 days.   feeding supplement Liqd Take 237 mLs by mouth 2 (two) times daily between meals.   Fish Oil 1000 MG Caps Take 1 capsule (1,000 mg total) by mouth daily.   glipiZIDE 10 MG 24 hr tablet Commonly known as: GLUCOTROL XL Take 10 mg by mouth daily.   hydrOXYzine 10 MG  tablet Commonly known as: ATARAX Take 1 tablet (10 mg total) by mouth at bedtime as needed.   losartan 25 MG tablet Commonly known as: COZAAR Take 1 tablet by mouth daily. What changed: Another medication with the same name was removed. Continue taking this medication, and follow the directions you see here.   lovastatin 40 MG tablet Commonly known as: MEVACOR Take 1 tablet (40 mg total) by mouth every evening.   meloxicam 15 MG tablet Commonly known as: MOBIC Take 1 tablet (15 mg total) by mouth daily.   metFORMIN 1000 MG tablet Commonly known as: GLUCOPHAGE TAKE ONE TABLET BY MOUTH TWICE DAILY   MULTIVITAMIN ADULT PO Take by mouth.   ondansetron 4 MG tablet Commonly known as: ZOFRAN Take 1 tablet (4 mg total) by mouth every 6 (six) hours as needed for nausea.   pantoprazole 40 MG tablet Commonly known as: PROTONIX Take 1  tablet (40 mg total) by mouth daily.   phosphorus 155-852-130 MG tablet Commonly known as: K PHOS NEUTRAL Take 1 tablet (250 mg total) by mouth 2 (two) times daily for 3 days.   sertraline 25 MG tablet Commonly known as: ZOLOFT Take 1 tablet (25 mg total) by mouth daily.   sodium bicarbonate 650 MG tablet Take 1 tablet (650 mg total) by mouth 2 (two) times daily for 2 days.               Durable Medical Equipment  (From admission, onward)           Start     Ordered   05/06/21 1048  For home use only DME 4 wheeled rolling walker with seat  Once       Question:  Patient needs a walker to treat with the following condition  Answer:  Debility   05/06/21 1047              Discharge Care Instructions  (From admission, onward)           Start     Ordered   05/07/21 0000  Discharge wound care:       Comments: As above.   05/07/21 1441           Allergies  Allergen Reactions   Latex Rash   Penicillins Other (See Comments)    Follow-up Information     Juline Patch, MD. Schedule an appointment as soon as  possible for a visit in 2 week(s).   Specialty: Family Medicine Contact information: [address] [place] [address] [phone]         Cammie Sickle, MD. Schedule an appointment as soon as possible for a visit in 2 week(s).   Specialties: Internal Medicine, Oncology Why: Follow-up in 1 to 2 weeks.  Office will call with an appointment. Contact information: [place] [address] [phone]                  The results of significant diagnostics from this hospitalization (including imaging, microbiology, ancillary and laboratory) are listed below for reference.    Significant Diagnostic Studies: CT HEAD WO CONTRAST (5MM)  Result Date: 05/01/2021 CLINICAL DATA:  Mental status change, unknown cause EXAM: CT HEAD WITHOUT CONTRAST TECHNIQUE: Contiguous axial images were obtained from the base of the skull through the vertex without intravenous contrast. RADIATION DOSE REDUCTION: This exam was performed according to the departmental dose-optimization program which includes automated exposure control, adjustment of the mA and/or kV according to patient size and/or use of iterative reconstruction technique. COMPARISON:  None. FINDINGS: Brain: No acute intracranial abnormality. Specifically, no hemorrhage, hydrocephalus, mass lesion, acute infarction, or significant intracranial injury. Vascular: No hyperdense vessel or unexpected calcification. Skull: No acute calvarial abnormality. Sinuses/Orbits: No acute findings Other: None IMPRESSION: No acute intracranial abnormality. Electronically Signed   By: Rolm Baptise M.D.   On: 05/01/2021 03:39   CT Chest Wo Contrast  Result Date: 05/01/2021 CLINICAL DATA:  Evaluate for pneumonia. EXAM: CT CHEST WITHOUT CONTRAST TECHNIQUE: Multidetector CT imaging of the chest was performed following the standard protocol without IV contrast. RADIATION DOSE REDUCTION: This exam was performed according to the  departmental dose-optimization program which includes automated exposure control, adjustment of the mA and/or kV according to patient size and/or use of iterative reconstruction technique. COMPARISON:  None. FINDINGS: Cardiovascular: Mild cardiac enlargement. No pericardial effusion. Aortic atherosclerosis and coronary artery atherosclerotic calcifications. Increase  caliber of the main pulmonary artery compatible with PA hypertension. Mediastinum/Nodes: No enlarged mediastinal or axillary lymph nodes. Thyroid gland, trachea, and esophagus demonstrate no significant findings. Lungs/Pleura: No pleural effusion. No airspace consolidation. Areas of subsegmental atelectasis noted within both lower lung zones and lingula. 4 mm perifissural nodule in the right middle lobe is identified, image 61/3. No suspicious nodule or mass identified. Upper Abdomen: No acute findings within the imaged portions of the upper abdomen. Musculoskeletal: The bones are markedly osteopenic. There are innumerable cortical lucencies throughout the ribs, thoracic spine and sternum concerning for metastatic disease., image 76/6. The thoracic vertebral body heights are well preserved. There are innumerable bilateral rib fractures. Some of these appear chronic or subacute in nature where as others are more acute in appearance IMPRESSION: 1. No evidence for pneumonia. 2. Bilateral areas of lower lung zone subsegmental atelectasis. No signs of pneumonia. 3. Innumerable cortical lucencies throughout the ribs, thoracic spine and sternum. Imaging findings are worrisome for diffuse osseous metastasis, lesions of multiple myeloma or diffuse metabolic bone disorder. 4. Innumerable bilateral rib fractures. Some of these appear chronic or subacute in nature and others are more acute in appearance. 5. Increase caliber of the main pulmonary artery compatible with PA hypertension. 6. Coronary artery atherosclerotic calcifications. 7. 4 mm perifissural nodule in  the right middle lobe is nonspecific. No follow-up needed if patient is low-risk. Non-contrast chest CT can be considered in 12 months if patient is high-risk. This recommendation follows the consensus statement: Guidelines for Management of Incidental Pulmonary Nodules Detected on CT Images: From the Fleischner Society 2017; Radiology 2017; 284:228-243. 8. Aortic Atherosclerosis (ICD10-I70.0). Electronically Signed   By: Kerby Moors M.D.   On: 05/01/2021 06:05   MR Pelvis W Wo Contrast  Result Date: 04/26/2021 CLINICAL DATA:  Cystic left adnexal lesion on recent ultrasound. Fibroids. Weight loss. EXAM: MRI PELVIS WITHOUT AND WITH CONTRAST TECHNIQUE: Multiplanar multisequence MR imaging of the pelvis was performed both before and after administration of intravenous contrast. CONTRAST:  53m GADAVIST GADOBUTROL 1 MMOL/ML IV SOLN COMPARISON:  Ultrasound on 02/21/2021 FINDINGS: Lower Urinary Tract: Incompletely distended urinary bladder, but otherwise unremarkable. Bowel: Unremarkable pelvic bowel loops. Vascular/Lymphatic: Unremarkable. No pathologically enlarged pelvic lymph nodes identified. Reproductive: -- Uterus: Measures 4.5 x 2.8 by 3.9 cm (volume = 26 cm^3). A few small uterine fibroids are seen, largest measuring 1.4 cm in maximum diameter. No abnormal endometrial thickening seen. Cervix and vagina are unremarkable. -- Right ovary: A 1.5 cm simple cyst is seen in the right ovary. No other cystic or solid masses are identified. -- Left ovary: A complex cystic lesion is seen in the left ovary which measures 3.6 x 3.0 by 2.3 cm. This contains several thin internal septations, and a 8 mm solid mural nodule with probable mild contrast enhancement. This is consistent with a cystic ovarian neoplasm. Other: No peritoneal thickening or abnormal free fluid. A loculated fluid collection is noted within the left inguinal canal which measures 3.6 x 1.6 cm, consistent with a hydrocele of the canal of Nuck.  Musculoskeletal:  Unremarkable. IMPRESSION: 3.6 cm complex cystic lesion in the left ovary, with thin septations and small solid mural nodule, consistent with cystic ovarian neoplasm. Surgical evaluation should be considered. 1.5 cm benign-appearing cyst in the right ovary. A few small uterine fibroids, largest measuring 1.4 cm. Incidentally noted small left-sided hydrocele of the canal of Nuck. Electronically Signed   By: JMarlaine HindM.D.   On: 04/26/2021 18:44   DG Bone  Density  Result Date: 04/08/2021 EXAM: DUAL X-RAY ABSORPTIOMETRY (DXA) FOR BONE MINERAL DENSITY IMPRESSION: crr Your patient Gracelyn Coventry completed a BMD test on 04/08/2021 using the [place] (analysis version: 14.10) manufactured by [employer]. The following summarizes the results of our evaluation. PATIENT BIOGRAPHICAL: Name: Lynnett, Langlinais Patient ID: [id number] Birth Date: [birth year] Height: 62.5 in. Gender: Female Exam Date: 04/08/2021 Weight: 132.8 lbs. Indications: Advanced Age, arthritis, diabetic, Early Menopause, Height Loss, History of Fracture (Adult), Low Body Weight, POSTmenopausal Fractures: Humerus, Spine Treatments: 81 MG ASPIRIN, calcium /vit d, glipizide, metformin, multivitamin, Vitamin D ASSESSMENT: The BMD measured at Femur Neck Left is 0.603 g/cm2 with a T-score of -3.1. This patient is considered osteoporotic according to [facility]) criteria. Lumbar spine was not utilized due to advanced degenerative changes/scoliosis, etc.The scan quality is limited by exclusion of L-spine. Site Region Measured Measured WHO Young Adult BMD Date       Age      Classification T-score DualFemur Neck Left 04/08/2023 85.0 Osteoporosis -3.1 0.603 g/cm2 DualFemur Neck Left 09/24/2017 81.4 Osteopenia -1.5 0.829 g/cm2 DualFemur Total Mean 04/08/2021 85.0 Osteoporosis -2.9 0.643 g/cm2 DualFemur Total Mean 09/24/2017 81.4 Osteopenia -1.2 0.857 g/cm2 Left Forearm Radius 33% 04/08/2021 85.0  Osteopenia -1.8 0.723 g/cm2 Left Forearm Radius 33% 09/24/2017 81.4 Osteopenia -1.2 0.771 g/cm2 World Health Organization [facility]) criteria for post-menopausal, Caucasian Women: Normal:       T-score at or above -1 SD Osteopenia:   T-score between -1 and -2.5 SD Osteoporosis: T-score at or below -2.5 SD RECOMMENDATIONS: 1. All patients should optimize calcium and vitamin D intake. 2. Consider FDA-approved medical therapies in postmenopausal women and men aged 56 years and older, based on the following: a. A hip or vertebral (clinical or morphometric) fracture b. T-score < -2.5 at the femoral neck or spine after appropriate evaluation to exclude secondary causes c. Low bone mass (T-score between -1.0 and -2.5 at the femoral neck or spine) and a 10-year probability of a hip fracture > 3% or a 10-year probability of a major osteoporosis-related fracture > 20% based on the [place]-adapted WHO algorithm d. Clinician judgment and/or patient preferences may indicate treatment for people with 10-year fracture probabilities above or below these levels FOLLOW-UP: People with diagnosed cases of osteoporosis or at high risk for fracture should have regular bone mineral density tests. For patients eligible for Medicare, routine testing is allowed once every 2 years. The testing frequency can be increased to one year for patients who have rapidly progressing disease, those who are receiving or discontinuing medical therapy to restore bone mass, or have additional risk factors. I have reviewed this report, and agree with the above findings. [facility] Electronically Signed   By: Elmer Picker M.D.   On: 04/08/2021 16:32   DG Chest Portable 1 View  Result Date: 05/01/2021 CLINICAL DATA:  malaise, poorly responsive EXAM: PORTABLE CHEST 1 VIEW COMPARISON:  02/18/2021 FINDINGS: Cardiomegaly, vascular congestion. No overt edema. Bibasilar atelectasis. No effusions or pneumothorax. No acute bony abnormality. IMPRESSION:  Cardiomegaly, vascular congestion. Electronically Signed   By: Rolm Baptise M.D.   On: 05/01/2021 03:47   DG Bone Survey Met  Result Date: 05/02/2021 CLINICAL DATA:  Inpatient.  Hypercalcemia. EXAM: METASTATIC BONE SURVEY COMPARISON:  None. FINDINGS: Innumerable small lucent lesions throughout the skeleton, including the proximal and mid right humeral shaft, bilateral iliac bones, proximal metaphyses and shafts of the bilateral femora. Severe lower thoracic vertebral body compression fracture. Moderate L1 and  severe L2 vertebral compression fractures. Moth eaten appearance to the ribs and spine. A few tiny lucent lesions overlying the calvarium. Acute or early subacute lateral left fourth and fifth rib fractures. Probable small calcified uterine fibroid overlying the sacrum to the right of midline. Mild to moderate bilateral knee osteoarthritis. IMPRESSION: 1. Innumerable small lucent lesions throughout the skeleton and moth eaten appearance to the ribs and spine, compatible with multiple myeloma. 2. Moderate L1 and severe L2 vertebral compression fractures. Severe lower thoracic vertebral compression fracture. 3. Acute or early subacute lateral left fourth and fifth rib fractures. Electronically Signed   By: Ilona Sorrel M.D.   On: 05/02/2021 10:04   CT BONE MARROW BIOPSY  Result Date: 05/06/2021 INDICATION: 87 year old female with history of hypercalcemia of malignancy presenting for bone marrow biopsy. EXAM: CT-GUIDED BONE MARROW BIOPSY AND ASPIRATION MEDICATIONS: None ANESTHESIA/SEDATION: Fentanyl 25 mcg IV; Versed 0 mg IV Sedation Time: 0 minutes; The patient was continuously monitored during the procedure by the interventional radiology nurse under my direct supervision. COMPLICATIONS: None immediate. PROCEDURE: Informed consent was obtained from the patient following an explanation of the procedure, risks, benefits and alternatives. The patient understands, agrees and consents for the procedure. All  questions were addressed. A time out was performed prior to the initiation of the procedure. The patient was positioned prone and non-contrast localization CT was performed of the pelvis to demonstrate the iliac marrow spaces. The operative site was prepped and draped in the usual sterile fashion. Under sterile conditions and local anesthesia, a 22 gauge spinal needle was utilized for procedural planning. Next, an 11 gauge coaxial bone biopsy needle was advanced into the right iliac marrow space. Needle position was confirmed with CT imaging. Initially, a bone marrow aspiration was performed. Next, a bone marrow biopsy was obtained with the 11 gauge outer bone marrow device. Samples were prepared with the cytotechnologist and deemed adequate. The needle was removed and superficial hemostasis was obtained with manual compression. A dressing was applied. The patient tolerated the procedure well without immediate post procedural complication. IMPRESSION: Successful CT guided right iliac bone marrow aspiration and core biopsy. Ruthann Cancer, MD [facility] Rad[facility] Electronically Signed   By: Ruthann Cancer M.D.   On: 05/06/2021 12:22   ECHOCARDIOGRAM COMPLETE  Result Date: 05/03/2021    ECHOCARDIOGRAM REPORT   Patient Name:   KEYLIE BEAVERS Date of Exam: 05/03/2021 Medical Rec #:  [id number]       Height:       67.0 in Accession #:    [id number]      Weight:       134.0 lb Date of Birth:  [birth year]       BSA:          1.706 m Patient Age:    87 years        BP:           161/80 mmHg Patient Gender: F               HR:           80 bpm. Exam Location:  [facility] Procedure: 2D Echo, Color Doppler and Cardiac Doppler Indications:     Supraventricular tachycardia  History:         Patient has no prior history of Echocardiogram examinations.                  Risk Factors:Hypertension, Diabetes and Dyslipidemia.  Sonographer:  Charmayne Sheer Referring Phys:  [place] Diagnosing Phys: Ida Rogue MD  Sonographer Comments: Suboptimal subcostal window. IMPRESSIONS  1. Left ventricular ejection fraction, by estimation, is 60 to 65%. The left ventricle has normal function. The left ventricle has no regional wall motion abnormalities. Left ventricular diastolic parameters are consistent with Grade I diastolic dysfunction (impaired relaxation).  2. Right ventricular systolic function is normal. The right ventricular size is normal.  3. The mitral valve is normal in structure. Mild to moderate mitral valve regurgitation. No evidence of mitral stenosis.  4. Tricuspid valve regurgitation is mild to moderate.  5. The aortic valve is normal in structure. Aortic valve regurgitation is mild. No aortic stenosis is present.  6. The inferior vena cava is normal in size with greater than 50% respiratory variability, suggesting right atrial pressure of 3 mmHg. FINDINGS  Left Ventricle: Left ventricular ejection fraction, by estimation, is 60 to 65%. The left ventricle has normal function. The left ventricle has no regional wall motion abnormalities. The left ventricular internal cavity size was normal in size. There is  no left ventricular hypertrophy. Left ventricular diastolic parameters are consistent with Grade I diastolic dysfunction (impaired relaxation). Right Ventricle: The right ventricular size is normal. No increase in right ventricular wall thickness. Right ventricular systolic function is normal. Left Atrium: Left atrial size was normal in size. Right Atrium: Right atrial size was normal in size. Pericardium: There is no evidence of pericardial effusion. Mitral Valve: The mitral valve is normal in structure. Mild to moderate mitral valve regurgitation. No evidence of mitral valve stenosis. MV peak gradient, 3.3 mmHg. The mean mitral valve gradient is 1.0 mmHg. Tricuspid Valve: The tricuspid valve is normal in structure. Tricuspid valve regurgitation is mild to moderate. No  evidence of tricuspid stenosis. Aortic Valve: The aortic valve is normal in structure. Aortic valve regurgitation is mild. No aortic stenosis is present. Aortic valve mean gradient measures 4.0 mmHg. Aortic valve peak gradient measures 6.4 mmHg. Aortic valve area, by VTI measures 2.61 cm. Pulmonic Valve: The pulmonic valve was normal in structure. Pulmonic valve regurgitation is mild. No evidence of pulmonic stenosis. Aorta: The aortic root is normal in size and structure. Venous: The inferior vena cava is normal in size with greater than 50% respiratory variability, suggesting right atrial pressure of 3 mmHg. IAS/Shunts: No atrial level shunt detected by color flow Doppler.  LEFT VENTRICLE PLAX 2D LVIDd:         4.06 cm   Diastology LVIDs:         2.78 cm   LV e' medial:    4.79 cm/s LV PW:         1.18 cm   LV E/e' medial:  14.9 LV IVS:        0.97 cm   LV e' lateral:   6.20 cm/s LVOT diam:     2.10 cm   LV E/e' lateral: 11.5 LV SV:         68 LV SV Index:   40 LVOT Area:     3.46 cm  RIGHT VENTRICLE RV Basal diam:  4.04 cm LEFT ATRIUM             Index        RIGHT ATRIUM           Index LA diam:        3.90 cm 2.29 cm/m   RA Area:     13.40 cm LA Vol (A2C):  34.9 ml 20.46 ml/m  RA Volume:   33.00 ml  19.34 ml/m LA Vol (A4C):   59.1 ml 34.64 ml/m LA Biplane Vol: 48.9 ml 28.66 ml/m  AORTIC VALVE                    PULMONIC VALVE AV Area (Vmax):    2.49 cm     PV Vmax:          0.91 m/s AV Area (Vmean):   2.54 cm     PV Vmean:         68.700 cm/s AV Area (VTI):     2.61 cm     PV VTI:           0.177 m AV Vmax:           126.00 cm/s  PV Peak grad:     3.3 mmHg AV Vmean:          92.400 cm/s  PV Mean grad:     2.0 mmHg AV VTI:            0.259 m      PR End Diast Vel: 3.98 msec AV Peak Grad:      6.4 mmHg AV Mean Grad:      4.0 mmHg LVOT Vmax:         90.70 cm/s LVOT Vmean:        67.700 cm/s LVOT VTI:          0.195 m LVOT/AV VTI ratio: 0.75  AORTA Ao Root diam: 3.00 cm MITRAL VALVE                TRICUSPID VALVE MV Area (PHT): 4.52 cm    TR Peak grad:   26.0 mmHg MV Area VTI:   3.55 cm    TR Vmax:        255.00 cm/s MV Peak grad:  3.3 mmHg MV Mean grad:  1.0 mmHg    SHUNTS MV Vmax:       0.90 m/s    Systemic VTI:  0.20 m MV Vmean:      58.1 cm/s   Systemic Diam: 2.10 cm MV Decel Time: 168 msec MV E velocity: 71.35 cm/s MV A velocity: 74.80 cm/s MV E/A ratio:  0.95 Ida Rogue MD Electronically signed by Ida Rogue MD Signature Date/Time: 05/03/2021/4:08:02 PM    Final     Microbiology: Recent Results (from the past 240 hour(s))  Resp Panel by RT-PCR (Flu A&B, Covid) Nasopharyngeal Swab     Status: None   Collection Time: 05/01/21  3:50 AM   Specimen: Nasopharyngeal Swab; Nasopharyngeal(NP) swabs in vial transport medium  Result Value Ref Range Status   SARS Coronavirus 2 by RT PCR NEGATIVE NEGATIVE Final    Comment: (NOTE) SARS-CoV-2 target nucleic acids are NOT DETECTED.  The SARS-CoV-2 RNA is generally detectable in upper respiratory specimens during the acute phase of infection. The lowest concentration of SARS-CoV-2 viral copies this assay can detect is 138 copies/mL. A negative result does not preclude SARS-Cov-2 infection and should not be used as the sole basis for treatment or other patient management decisions. A negative result may occur with  improper specimen collection/handling, submission of specimen other than nasopharyngeal swab, presence of viral mutation(s) within the areas targeted by this assay, and inadequate number of viral copies(<138 copies/mL). A negative result must be combined with clinical observations, patient history, and epidemiological information. The expected result is Negative.  Fact Sheet for Patients:  EntrepreneurPulse.com.au  Fact Sheet for Healthcare Providers:  IncredibleEmployment.be  This test is no t yet approved or cleared by the [place] FDA and  has been authorized for detection  and/or diagnosis of SARS-CoV-2 by FDA under an Emergency Use Authorization (EUA). This EUA will remain  in effect (meaning this test can be used) for the duration of the COVID-19 declaration under Section 564(b)(1) of the Act, 21 U.S.C.section 360bbb-3(b)(1), unless the authorization is terminated  or revoked sooner.       Influenza A by PCR NEGATIVE NEGATIVE Final   Influenza B by PCR NEGATIVE NEGATIVE Final    Comment: (NOTE) The Xpert Xpress SARS-CoV-2/FLU/RSV plus assay is intended as an aid in the diagnosis of influenza from Nasopharyngeal swab specimens and should not be used as a sole basis for treatment. Nasal washings and aspirates are unacceptable for Xpert Xpress SARS-CoV-2/FLU/RSV testing.  Fact Sheet for Patients: EntrepreneurPulse.com.au  Fact Sheet for Healthcare Providers: IncredibleEmployment.be  This test is not yet approved or cleared by the [place] FDA and has been authorized for detection and/or diagnosis of SARS-CoV-2 by FDA under an Emergency Use Authorization (EUA). This EUA will remain in effect (meaning this test can be used) for the duration of the COVID-19 declaration under Section 564(b)(1) of the Act, 21 U.S.C. section 360bbb-3(b)(1), unless the authorization is terminated or revoked.  Performed at [facility], [address] [place], [place] [address]      Labs: Basic Metabolic Panel: Recent Labs  Lab 05/02/21 [phone] 05/02/21 9211 05/03/21 0532 05/04/21 0430 05/05/21 0532 05/06/21 0435 05/07/21 0312  NA 138  --  131* 128* 132* 130* 133*  K 3.8  --  3.5 4.2 3.7 4.0 3.6  CL 106  --  103 105 108 105 106  CO2 24  --  22 17* 19* 18* 22  GLUCOSE 205*  --  178* 125* 110* 144* 80  BUN 14  --  '15 22 16 14 18  ' CREATININE 0.79  --  0.63 0.84 0.73 0.81 1.10*  CALCIUM 10.8*   < > 9.1 7.9* 7.8* 8.0* 7.0*  MG 1.1*  --  1.4* 2.1 2.0 2.0  --   PHOS 1.8*  --  1.5* 2.0* 1.7* 1.4* 2.7   < > =  values in this interval not displayed.   Liver Function Tests: Recent Labs  Lab 05/01/21 0320 05/03/21 0532 05/04/21 0430 05/05/21 0532 05/06/21 0435 05/07/21 0312  AST 18  --   --   --   --   --   ALT 12  --   --   --   --   --   ALKPHOS 88  --   --   --   --   --   BILITOT 0.7  --   --   --   --   --   PROT 7.8  --   --   --   --   --   ALBUMIN 3.8 3.1* 2.8* 3.1* 3.1* 2.4*   Recent Labs  Lab 05/01/21 0320  LIPASE 46   No results for input(s): AMMONIA in the last 168 hours. CBC: Recent Labs  Lab 05/01/21 0320 05/02/21 0609 05/03/21 0532 05/04/21 0430 05/05/21 0532 05/06/21 0435 05/07/21 0312  WBC 6.7   < > 8.5 5.9 5.2 6.5 6.9  NEUTROABS 4.3  --  7.4 3.5  --  3.8  --   HGB 10.5*   < > 9.5* 9.4* 10.1* 10.4* 8.6*  HCT 33.3*   < >  28.4* 28.2* 30.9* 31.9* 26.0*  MCV 99.1   < > 94.4 94.3 95.1 94.4 94.5  PLT 314   < > 247 158 246 252 220   < > = values in this interval not displayed.   Cardiac Enzymes: No results for input(s): CKTOTAL, CKMB, CKMBINDEX, TROPONINI in the last 168 hours. BNP: BNP (last 3 results) No results for input(s): BNP in the last 8760 hours.  ProBNP (last 3 results) No results for input(s): PROBNP in the last 8760 hours.  CBG: Recent Labs  Lab 05/07/21 0001 05/07/21 0326 05/07/21 0346 05/07/21 0756 05/07/21 1110  GLUCAP 123* 64* 92 146* 189*       Signed:  Irine Seal MD.  [facility] 05/07/2021, 2:55 PM

## 2021-05-07 NOTE — Progress Notes (Signed)
Cristina Morgan   DOB:Feb 27, 1935   LY#:[id number]    Subjective: No acute events overnight.  Patient more lucid.  daughter was at bedside.  S/p bone marrow biopsy this morning.  Denies any pain.   Objective:  Vitals:   05/07/21 0752 05/07/21 1113  BP: (!) 137/59 (!) 114/45  Pulse: 76 72  Resp: 16 16  Temp: 98.5 F (36.9 C) 98.9 F (37.2 C)  SpO2: 95% 98%     Intake/Output Summary (Last 24 hours) at 05/07/2021 2201 Last data filed at 05/07/2021 1500 Gross per 24 hour  Intake 2015.98 ml  Output 350 ml  Net 1665.98 ml   Elderly cachectic appearing female patient.  Alert oriented x2-3.    Physical Exam Vitals and nursing note reviewed.  HENT:     Head: Normocephalic and atraumatic.     Mouth/Throat:     Pharynx: Oropharynx is clear.  Eyes:     Extraocular Movements: Extraocular movements intact.     Pupils: Pupils are equal, round, and reactive to light.  Cardiovascular:     Rate and Rhythm: Normal rate and regular rhythm.  Pulmonary:     Comments: Decreased breath sounds bilaterally.  Abdominal:     Palpations: Abdomen is soft.  Musculoskeletal:        General: Normal range of motion.     Cervical back: Normal range of motion.  Skin:    General: Skin is warm.  Neurological:     General: No focal deficit present.     Mental Status: She is alert.     Labs:  Lab Results  Component Value Date   WBC 6.9 05/07/2021   HGB 8.6 (L) 05/07/2021   HCT 26.0 (L) 05/07/2021   MCV 94.5 05/07/2021   PLT 220 05/07/2021   NEUTROABS 3.8 05/06/2021    Lab Results  Component Value Date   NA 133 (L) 05/07/2021   K 3.6 05/07/2021   CL 106 05/07/2021   CO2 22 05/07/2021    Studies:  CT BONE MARROW BIOPSY  Result Date: 05/06/2021 INDICATION: 87 year old female with history of hypercalcemia of malignancy presenting for bone marrow biopsy. EXAM: CT-GUIDED BONE MARROW BIOPSY AND ASPIRATION MEDICATIONS: None ANESTHESIA/SEDATION: Fentanyl 25 mcg IV; Versed 0 mg IV Sedation Time: 0  minutes; The patient was continuously monitored during the procedure by the interventional radiology nurse under my direct supervision. COMPLICATIONS: None immediate. PROCEDURE: Informed consent was obtained from the patient following an explanation of the procedure, risks, benefits and alternatives. The patient understands, agrees and consents for the procedure. All questions were addressed. A time out was performed prior to the initiation of the procedure. The patient was positioned prone and non-contrast localization CT was performed of the pelvis to demonstrate the iliac marrow spaces. The operative site was prepped and draped in the usual sterile fashion. Under sterile conditions and local anesthesia, a 22 gauge spinal needle was utilized for procedural planning. Next, an 11 gauge coaxial bone biopsy needle was advanced into the right iliac marrow space. Needle position was confirmed with CT imaging. Initially, a bone marrow aspiration was performed. Next, a bone marrow biopsy was obtained with the 11 gauge outer bone marrow device. Samples were prepared with the cytotechnologist and deemed adequate. The needle was removed and superficial hemostasis was obtained with manual compression. A dressing was applied. The patient tolerated the procedure well without immediate post procedural complication. IMPRESSION: Successful CT guided right iliac bone marrow aspiration and core biopsy. Ruthann Cancer, MD [facility] Radiology Electronically Signed   By: Ruthann Cancer M.D.   On: 05/06/2021 40:43     #87 year old female patient with no prior history of malignancy-is currently admitted to hospital for mental status changes/hypercalcemia calcium 11.1; incidentally on CT scan.  Patient noted to have multiple lytic lesions in the bone.    #Hypercalcemia-calcium 11.2 on admission.  CT chest multiple bone lesions-lytic in the bone; mild anemia; however normal renal  function-concerning for underlying malignancy like multiple myeloma versus . Metastatic bone survey is consistent with multiple lytic lesion concerning for multiple myeloma.  Multiple myeloma panel -2.2 g/dL immunofixation shows IgG monoclonal protein with kappa light chain. kappa/lambda light chain ratio elevated 12.  calcium -normal.   S/p Zometa on 2/15.  Status post bone marrow biopsy- on 2/20.  Awaiting results.  While awaiting results-given the significant fatigue/debility-I think is reasonable to start low-dose of dexamethasone 8 mg once a day for 4 days.   #Diabetes-on metformin well-controlled; discussed the importance of monitoring blood sugars while on dexamethasone as it might increase her blood sugars.  Recommend checking blood sugars 3 times a day.  Recommend reaching out to PCP/if blood sugars are elevated.  Understands that patient may need to sliding scale insulin.  #Mental status changes likely secondary to hypercalcemia; CT head noncontrast negative-significant improvement noted overall-currently at baseline.  #Multiple thoracic fractures [followed by Dr.Cook at [facility]]-again secondary to multiple myeloma/osteoporosis.  Patient will need outpatient bisphosphonate.  #Discussed with the patient's daughter that multiple myeloma is unfortunately incurable [again pending confirmation bone marrow biopsy results].  Treatment includes dexamethasone based therapy-includes immunomodulatory therapy; Velcade or dara.  Patient not a candidate for stem cell transplant.  Patient's daughter interested in continuing the care at [facility].  We will make a referral to [facility].  While pending appointment at [place] will follow-up with [place] in the cancer center in approximately 1 week.  The above plan of care was discussed with Dr.Thompson.   Cammie Sickle, MD 05/07/2021  10:01 PM

## 2021-05-07 NOTE — Plan of Care (Signed)

## 2021-05-07 NOTE — Plan of Care (Signed)
Problem: Education: Goal: Knowledge of General Education information will improve Description: Including pain rating scale, medication(s)/side effects and non-pharmacologic comfort measures 05/07/2021 1520 by Murrell Converse, RN Outcome: Completed/Met 05/07/2021 1101 by Murrell Converse, RN Outcome: Progressing   Problem: Health Behavior/Discharge Planning: Goal: Ability to manage health-related needs will improve 05/07/2021 1520 by Murrell Converse, RN Outcome: Completed/Met 05/07/2021 1101 by Murrell Converse, RN Outcome: Progressing   Problem: Clinical Measurements: Goal: Ability to maintain clinical measurements within normal limits will improve 05/07/2021 1520 by Murrell Converse, RN Outcome: Completed/Met 05/07/2021 1101 by Murrell Converse, RN Outcome: Progressing Goal: Will remain free from infection 05/07/2021 1520 by Murrell Converse, RN Outcome: Completed/Met 05/07/2021 1101 by Murrell Converse, RN Outcome: Progressing Goal: Diagnostic test results will improve 05/07/2021 1520 by Murrell Converse, RN Outcome: Completed/Met 05/07/2021 1101 by Murrell Converse, RN Outcome: Progressing Goal: Respiratory complications will improve 05/07/2021 1520 by Murrell Converse, RN Outcome: Completed/Met 05/07/2021 1101 by Murrell Converse, RN Outcome: Progressing Goal: Cardiovascular complication will be avoided 05/07/2021 1520 by Murrell Converse, RN Outcome: Completed/Met 05/07/2021 1101 by Murrell Converse, RN Outcome: Progressing   Problem: Activity: Goal: Risk for activity intolerance will decrease 05/07/2021 1520 by Murrell Converse, RN Outcome: Completed/Met 05/07/2021 1101 by Murrell Converse, RN Outcome: Progressing   Problem: Nutrition: Goal: Adequate nutrition will be maintained 05/07/2021 1520 by Murrell Converse, RN Outcome: Completed/Met 05/07/2021 1101 by Murrell Converse, RN Outcome: Progressing   Problem: Coping: Goal: Level  of anxiety will decrease 05/07/2021 1520 by Murrell Converse, RN Outcome: Completed/Met 05/07/2021 1101 by Murrell Converse, RN Outcome: Progressing   Problem: Elimination: Goal: Will not experience complications related to bowel motility 05/07/2021 1520 by Murrell Converse, RN Outcome: Completed/Met 05/07/2021 1101 by Murrell Converse, RN Outcome: Progressing Goal: Will not experience complications related to urinary retention 05/07/2021 1520 by Murrell Converse, RN Outcome: Completed/Met 05/07/2021 1101 by Murrell Converse, RN Outcome: Progressing   Problem: Pain Managment: Goal: General experience of comfort will improve 05/07/2021 1520 by Murrell Converse, RN Outcome: Completed/Met 05/07/2021 1101 by Murrell Converse, RN Outcome: Progressing   Problem: Safety: Goal: Ability to remain free from injury will improve 05/07/2021 1520 by Murrell Converse, RN Outcome: Completed/Met 05/07/2021 1101 by Murrell Converse, RN Outcome: Progressing   Problem: Skin Integrity: Goal: Risk for impaired skin integrity will decrease 05/07/2021 1520 by Murrell Converse, RN Outcome: Completed/Met 05/07/2021 1101 by Murrell Converse, RN Outcome: Progressing

## 2021-05-07 NOTE — Progress Notes (Signed)
Inpatient Diabetes Program Recommendations  AACE/ADA: New Consensus Statement on Inpatient Glycemic Control (2015)  Target Ranges:  Prepandial:   less than 140 mg/dL      Peak postprandial:   less than 180 mg/dL (1-2 hours)      Critically ill patients:  140 - 180 mg/dL   Lab Results  Component Value Date   GLUCAP 189 (H) 05/07/2021   HGBA1C 5.9 (H) 02/01/2021    Review of Glycemic Control  Diabetes history: DM2 Outpatient Diabetes medications: Glucotrol XL 10 mg qd, Metformin 1 gm bid Current orders for Inpatient glycemic control: Novolog correction 0-15 units q 4 hrs.  Inpatient Diabetes Program Recommendations:   Please consider: -Decrease Novolog correction to 0-9 units tid + hs 0-5 units Secure chat sent to Dr. Grandville Silos.  Thank you, Nani Gasser. Jaylee Lantry, RN, MSN, CDE  Diabetes Coordinator Inpatient Glycemic Control Team Team Pager [phone] (8am-5pm) 05/07/2021 12:20 PM

## 2021-05-07 NOTE — Assessment & Plan Note (Signed)
Pressure Injury 05/01/21 Coccyx Mid Stage 2 -  Partial thickness loss of dermis presenting as a shallow open injury with a red, pink wound bed without slough. small open area (Active)  05/01/21 1500  Location: Coccyx  Location Orientation: Mid  Staging: Stage 2 -  Partial thickness loss of dermis presenting as a shallow open injury with a red, pink wound bed without slough.  Wound Description (Comments): small open area  Present on Admission: Yes

## 2021-05-07 NOTE — TOC Transition Note (Signed)
Transition of Care [facility]) - CM/SW Discharge Note   Patient Details  Name: Cristina Morgan MRN: [id number] Date of Birth: [birth year]  [facility] CM/SW Contact:  Eileen Stanford, LCSW Phone Number: 05/07/2021, 3:54 PM   Clinical Narrative:   [place] arranged through [facility], they are aware pt is dc today. DME was delivered to bedside yesterday.    Final next level of care: Home w Home Health Services Barriers to Discharge: No Barriers Identified   Patient Goals and CMS Choice Patient states their goals for this hospitalization and ongoing recovery are:: per daughter- for pt to return home   Choice offered to / list presented to : Adult Children  Discharge Placement                    Patient and family notified of of transfer: 05/07/21  Discharge Plan and Services In-house Referral: Clinical Social Work   Post Acute Care Choice: Home Health                    HH Arranged: PT, OT [facility] Agency: [place] ([facility]) Date HH Agency Contacted: 05/06/21 Time [place]: [phone] Representative spoke with at [place]: [place] ([place]) Interventions     Readmission Risk Interventions No flowsheet data found.

## 2021-05-07 NOTE — Telephone Encounter (Signed)
M-please have the patient follow-up with me in approximately 1 week; MD labs-CBC CMP; beta-2 microglobulin; possible Zometa infusion. Dx: Multiple myeloma  FYI-Dr.Jones.

## 2021-05-07 NOTE — Telephone Encounter (Signed)
Copied from [place] [id number]. Topic: General - Other >> May 07, 2021  4:29 PM Loma [place] wrote: Pt is being discharged today from [facility] and hospital is reaching out for HFU pls reach out to sch as end of May is best HFU available. Did not make appt.. Nurse is telling pt that PCP will return call for sching. Fu [phone]

## 2021-05-07 NOTE — Progress Notes (Signed)
Occupational Therapy Treatment Patient Details Name: Cristina Morgan MRN: [id number] DOB: 04-18-1934 Today's Date: 05/07/2021   History of present illness Pt is an 87 y/o F with PMH: DM and HTN who was admitted d/t mental status changes. She was found to have hypercalcemia and incidentally on CT scan, lytic lesions to the bone per oncology note. Metastatic bone survey reports: "Innumerable small lucent lesions throughout the skeleton and moth  eaten appearance to the ribs and spine, compatible with multiple  myeloma." Pt being seen by Dr. Lacinda Axon with neurosurgery OP And is being managed conservatively.   OT comments  Pt seen for OT tx this date. Family present and supportive throughout. Pt agreeable to session and per family is more "like her self" today. Pt required MOD A for log roll bed mobility with VC for sequencing/technique. Pt tolerated sitting EOB for back brace application and instruction with adjustments made for improved fit and comfort. Pt completed grooming tasks seated EOB with set up and supervision. Pt completed STS t/f and took a couple lateral shuffled steps EOB with the RW and MIN-MOD A to complete with VC for anterior weight shift to correct for slight posterior lean. Pt tolerated the session well, despite back pain. RN notified and verbalized she would get pain meds for the pt. Pt progressing well. Pt/family want to return home at discharge. Will continue to work towards this goal. Continue to recommend higher level of therapy at discharge.    Recommendations for follow up therapy are one component of a multi-disciplinary discharge planning process, led by the attending physician.  Recommendations may be updated based on patient status, additional functional criteria and insurance authorization.    Follow Up Recommendations  Skilled nursing-short term rehab (<3 hours/day)    Assistance Recommended at Discharge Frequent or constant Supervision/Assistance  Patient can return home  with the following  A lot of help with walking and/or transfers;A lot of help with bathing/dressing/bathroom;Assistance with cooking/housework;Direct supervision/assist for medications management;Direct supervision/assist for financial management;Assist for transportation   Equipment Recommendations  BSC/3in1;Tub/shower seat    Recommendations for Other Services      Precautions / Restrictions Precautions Precautions: Back;Fall Restrictions Weight Bearing Restrictions: No Other Position/Activity Restrictions: Oncology note indicates that pt sees Dr. Lacinda Axon OP for spine and has been managed with a brace. Upon chart review in Care Everywhere, under plan from Dr. Remo Lipps Cook's note from 11/12/20 indicates "brace is prescribed for stability for ambulation, aid in healing and pain control". Pt's daughter who is present in room is agreeable to bringing in brace on 05/03/21. Per Dr. Grandville Silos - try to mobilize pt with brace on, if she cannot tolerate it, it's okay to mobilize her without brace (via secure chat) 05/03/21.       Mobility Bed Mobility Overal bed mobility: Needs Assistance Bed Mobility: Rolling, Sidelying to Sit, Sit to Sidelying Rolling: Max assist Sidelying to sit: Mod assist, HOB elevated     Sit to sidelying: Mod assist General bed mobility comments: VC for technique back to bed with decreased carryover    Transfers Overall transfer level: Needs assistance Equipment used: Rolling walker (2 wheels) Transfers: Sit to/from Stand, Bed to chair/wheelchair/BSC Sit to Stand: Min assist, Mod assist          Lateral/Scoot Transfers: Min assist General transfer comment: VC for hand placement, small shuffled lateral steps EOB with posterior lean     Balance Overall balance assessment: Needs assistance Sitting-balance support: Feet supported Sitting balance-Leahy Scale: Good   Postural  control: Posterior lean Standing balance support: Bilateral upper extremity supported,  Reliant on assistive device for balance Standing balance-Leahy Scale: Poor Standing balance comment: requires MIN A + VC for anterior weight shift to minimize posterior lean                           ADL either performed or assessed with clinical judgement   ADL Overall ADL's : Needs assistance/impaired     Grooming: Sitting;Supervision/safety;Wash/dry hands;Wash/dry face;Oral care;Set up                                      Extremity/Trunk Assessment              Vision       Perception     Praxis      Cognition Arousal/Alertness: Awake/alert Behavior During Therapy: WFL for tasks assessed/performed Overall Cognitive Status: Within Functional Limits for tasks assessed                                 General Comments: pleasant, alert, follows commands with +time and cues, recalls some hallucinations she was having but much more herself today per family present        Exercises Other Exercises Other Exercises: log roll for bed mobility, ADL transfer training with RW mgt, brace application/readjusting for improved comfort/fit    Shoulder Instructions       General Comments      Pertinent Vitals/ Pain       Pain Assessment Pain Assessment: 0-10 Pain Score: 10-Worst pain ever Pain Location: back Pain Descriptors / Indicators: Grimacing, Discomfort Pain Intervention(s): Limited activity within patient's tolerance, Monitored during session, Repositioned, Patient requesting pain meds-RN notified  Home Living                                          Prior Functioning/Environment              Frequency  Min 2X/week        Progress Toward Goals  OT Goals(current goals can now be found in the care plan section)  Progress towards OT goals: Progressing toward goals  Acute Rehab OT Goals Patient Stated Goal: to get better OT Goal Formulation: With patient/family Time For Goal Achievement:  05/16/21 Potential to Achieve Goals: Good  Plan Discharge plan remains appropriate;Frequency remains appropriate    Co-evaluation                 AM-PAC OT "6 Clicks" Daily Activity     Outcome Measure   Help from another person eating meals?: None Help from another person taking care of personal grooming?: A Little Help from another person toileting, which includes using toliet, bedpan, or urinal?: A Lot Help from another person bathing (including washing, rinsing, drying)?: A Lot Help from another person to put on and taking off regular upper body clothing?: A Little Help from another person to put on and taking off regular lower body clothing?: A Lot 6 Click Score: 16    End of Session Equipment Utilized During Treatment: Rolling walker (2 wheels);Back brace  OT Visit Diagnosis: Unsteadiness on feet (R26.81);Other abnormalities of gait and mobility (R26.89);Muscle weakness (generalized) (M62.81);Pain Pain -  part of body:  (back)   Activity Tolerance Patient tolerated treatment well   Patient Left in bed;with call bell/phone within reach;with bed alarm set;with family/visitor present   Nurse Communication Patient requests pain meds        Time: 4401-0272 OT Time Calculation (min): 34 min  Charges: OT General Charges $OT Visit: 1 Visit OT Treatments $Self Care/Home Management : 23-37 mins  Ardeth Perfect., MPH, MS, OTR/L ascom [phone] 05/07/21, 10:06 AM

## 2021-05-08 ENCOUNTER — Telehealth: Payer: Self-pay

## 2021-05-08 ENCOUNTER — Other Ambulatory Visit: Payer: Self-pay

## 2021-05-08 DIAGNOSIS — C9000 Multiple myeloma not having achieved remission: Secondary | ICD-10-CM

## 2021-05-08 LAB — SURGICAL PATHOLOGY

## 2021-05-08 NOTE — Telephone Encounter (Signed)
Lab orders entered

## 2021-05-08 NOTE — Telephone Encounter (Signed)
Cristina Morgan the daughter said that they had just got home from the hospital and will call to make the app with Dr Burlene Arnt.

## 2021-05-08 NOTE — Telephone Encounter (Signed)
Transition Care Management Follow-up Telephone Call Date of discharge and from where: 05/07/21 [facility] How have you been since you were released from the hospital? Pt still having some pain and feels tired and weak but doing a little better per her daughter Cristina Morgan Any questions or concerns? No  Items Reviewed: Did the pt receive and understand the discharge instructions provided? Yes  Medications obtained and verified? Yes  Other? No  Any new allergies since your discharge? No  Dietary orders reviewed? Yes Do you have support at home? Yes   Home Care and Equipment/Supplies: Were home health services ordered? yes If so, what is the name of the agency? [facility]  Has the agency set up a time to come to the patient's home? yes Were any new equipment or medical supplies ordered?  Yes: rollator What is the name of the medical supply agency? [employer]  Were you able to get the supplies/equipment? yes Do you have any questions related to the use of the equipment or supplies? No  Functional Questionnaire: (I = Independent and D = Dependent) ADLs: I  Bathing/Dressing- I with assistance  Meal Prep- D  Eating- I  Maintaining continence- I  Transferring/Ambulation- I with assistance  Managing Meds- D  Follow up appointments reviewed:  PCP Hospital f/u appt confirmed? Yes  Scheduled to see Dr. Ronnald Morgan on 05/17/21 @ 1:20. [facility] f/u appt confirmed? Yes  Scheduled to see Dr. Rogue Morgan on 05/15/21. Are transportation arrangements needed? No  If their condition worsens, is the pt aware to call PCP or go to the Emergency Dept.? Yes Was the patient provided with contact information for the PCP's office or ED? Yes Was to pt encouraged to call back with questions or concerns? Yes

## 2021-05-09 ENCOUNTER — Other Ambulatory Visit: Payer: Self-pay | Admitting: Family Medicine

## 2021-05-09 DIAGNOSIS — M8448XD Pathological fracture, other site, subsequent encounter for fracture with routine healing: Secondary | ICD-10-CM | POA: Diagnosis not present

## 2021-05-09 DIAGNOSIS — E119 Type 2 diabetes mellitus without complications: Secondary | ICD-10-CM | POA: Diagnosis not present

## 2021-05-09 DIAGNOSIS — Z9181 History of falling: Secondary | ICD-10-CM | POA: Diagnosis not present

## 2021-05-09 DIAGNOSIS — E538 Deficiency of other specified B group vitamins: Secondary | ICD-10-CM | POA: Diagnosis not present

## 2021-05-09 DIAGNOSIS — Z791 Long term (current) use of non-steroidal anti-inflammatories (NSAID): Secondary | ICD-10-CM | POA: Diagnosis not present

## 2021-05-09 DIAGNOSIS — I10 Essential (primary) hypertension: Secondary | ICD-10-CM | POA: Diagnosis not present

## 2021-05-09 DIAGNOSIS — E871 Hypo-osmolality and hyponatremia: Secondary | ICD-10-CM | POA: Diagnosis not present

## 2021-05-09 DIAGNOSIS — Z7982 Long term (current) use of aspirin: Secondary | ICD-10-CM | POA: Diagnosis not present

## 2021-05-09 DIAGNOSIS — R293 Abnormal posture: Secondary | ICD-10-CM | POA: Diagnosis not present

## 2021-05-09 DIAGNOSIS — E7800 Pure hypercholesterolemia, unspecified: Secondary | ICD-10-CM | POA: Diagnosis not present

## 2021-05-09 DIAGNOSIS — Z7984 Long term (current) use of oral hypoglycemic drugs: Secondary | ICD-10-CM | POA: Diagnosis not present

## 2021-05-09 NOTE — Telephone Encounter (Signed)
Requested Prescriptions  Pending Prescriptions Disp Refills   metFORMIN (GLUCOPHAGE) 1000 MG tablet [Pharmacy Med Name: metformin 1,000 mg tablet] 180 tablet 0    Sig: TAKE ONE TABLET BY MOUTH TWICE DAILY     Endocrinology:  Diabetes - Biguanides Failed - 05/09/2021  8:00 AM      Failed - Cr in normal range and within 360 days    Creatinine, Ser  Date Value Ref Range Status  05/07/2021 1.10 (H) 0.44 - 1.00 mg/dL Final         Failed - eGFR in normal range and within 360 days    GFR calc Af Amer  Date Value Ref Range Status  12/02/2019 90 >59 mL/min/1.73 Final    Comment:    **[facility] currently reports eGFR in compliance with the current**   recommendations of the [employer]. [facility] will   update reporting as new guidelines are published from the NKF-ASN   Task force.    GFR, Estimated  Date Value Ref Range Status  05/07/2021 49 (L) >60 mL/min Final    Comment:    (NOTE) Calculated using the CKD-EPI Creatinine Equation (2021)    eGFR  Date Value Ref Range Status  02/01/2021 68 >59 mL/min/1.73 Final         Failed - B12 Level in normal range and within 720 days    Vitamin B-12  Date Value Ref Range Status  05/03/2021 3,868 (H) 180 - 914 pg/mL Final    Comment:    (NOTE) This assay is not validated for testing neonatal or myeloproliferative syndrome specimens for Vitamin B12 levels. Performed at [facility], [place] [address], [place], [place] [address]          Failed - CBC within normal limits and completed in the last 12 months    WBC  Date Value Ref Range Status  05/07/2021 6.9 4.0 - 10.5 K/uL Final   RBC  Date Value Ref Range Status  05/07/2021 2.75 (L) 3.87 - 5.11 MIL/uL Final   Hemoglobin  Date Value Ref Range Status  05/07/2021 8.6 (L) 12.0 - 15.0 g/dL Final  04/15/2021 9.5 (L) 11.1 - 15.9 g/dL Final   HCT  Date Value Ref Range Status  05/07/2021 26.0 (L) 36.0 - 46.0 % Final   Hematocrit  Date Value Ref Range Status   04/15/2021 29.8 (L) 34.0 - 46.6 % Final   MCHC  Date Value Ref Range Status  05/07/2021 33.1 30.0 - 36.0 g/dL Final   [facility]  Date Value Ref Range Status  05/07/2021 31.3 26.0 - 34.0 pg Final   MCV  Date Value Ref Range Status  05/07/2021 94.5 80.0 - 100.0 fL Final  04/15/2021 98 (H) 79 - 97 fL Final   No results found for: PLTCOUNTKUC, LABPLAT, POCPLA RDW  Date Value Ref Range Status  05/07/2021 13.2 11.5 - 15.5 % Final  04/15/2021 11.9 11.7 - 15.4 % Final         Passed - HBA1C is between 0 and 7.9 and within 180 days    Hgb A1c MFr Bld  Date Value Ref Range Status  02/01/2021 5.9 (H) 4.8 - 5.6 % Final    Comment:             Prediabetes: 5.7 - 6.4          Diabetes: >6.4          Glycemic control for adults with diabetes: <7.0  Passed - Valid encounter within last 6 months    Recent Outpatient Visits          3 weeks ago Age related osteoporosis, unspecified pathological fracture presence   [facility] Juline Patch, MD   2 months ago Type 2 diabetes mellitus without complication, without long-term current use of insulin ([place])   [facility] Juline Patch, MD   2 months ago Weight loss   [facility] Juline Patch, MD   3 months ago Type 2 diabetes mellitus without complication, without long-term current use of insulin ([place])   [facility] Juline Patch, MD   4 months ago Type 2 diabetes mellitus without complication, without long-term current use of insulin ([place])   [facility] Juline Patch, MD      Future Appointments            In 1 week Juline Patch, MD [place]   In 2 weeks Lucilla Lame, MD [place]

## 2021-05-10 ENCOUNTER — Encounter: Payer: Self-pay | Admitting: *Deleted

## 2021-05-10 DIAGNOSIS — Z20822 Contact with and (suspected) exposure to covid-19: Secondary | ICD-10-CM | POA: Diagnosis not present

## 2021-05-13 ENCOUNTER — Telehealth: Payer: Self-pay | Admitting: Family Medicine

## 2021-05-13 DIAGNOSIS — E7800 Pure hypercholesterolemia, unspecified: Secondary | ICD-10-CM | POA: Diagnosis not present

## 2021-05-13 DIAGNOSIS — M8448XD Pathological fracture, other site, subsequent encounter for fracture with routine healing: Secondary | ICD-10-CM | POA: Diagnosis not present

## 2021-05-13 DIAGNOSIS — E871 Hypo-osmolality and hyponatremia: Secondary | ICD-10-CM | POA: Diagnosis not present

## 2021-05-13 DIAGNOSIS — I10 Essential (primary) hypertension: Secondary | ICD-10-CM | POA: Diagnosis not present

## 2021-05-13 DIAGNOSIS — E119 Type 2 diabetes mellitus without complications: Secondary | ICD-10-CM | POA: Diagnosis not present

## 2021-05-13 NOTE — Telephone Encounter (Signed)
Cindy with [facility] is calling in to request verbal orders to continue to work with pt.   Frequency: 2 week 4 and 1 week 1    CB: [phone]- secure voicemail

## 2021-05-13 NOTE — Telephone Encounter (Signed)
Home Health Verbal Orders - Caller/Agency: [place] Number: [phone]  Requesting OT/PT/Skilled Nursing/Social Work/Speech Therapy: Skilled Nursing & PT (declined Home Health Aid)  Frequency: 1w5

## 2021-05-14 ENCOUNTER — Encounter (HOSPITAL_COMMUNITY): Payer: Self-pay | Admitting: Internal Medicine

## 2021-05-14 ENCOUNTER — Telehealth: Payer: Self-pay

## 2021-05-14 NOTE — Telephone Encounter (Signed)
Put calls in to [place] with [facility] at [phone] for care 2 times week x 4 weeks and 1 time week x 1 week. Also to [place] with them for skilled nursing 1 x week x 5 weeks- [phone]

## 2021-05-15 ENCOUNTER — Inpatient Hospital Stay: Payer: Medicare Other

## 2021-05-15 ENCOUNTER — Other Ambulatory Visit: Payer: Self-pay

## 2021-05-15 ENCOUNTER — Telehealth: Payer: Self-pay | Admitting: Pharmacy Technician

## 2021-05-15 ENCOUNTER — Encounter (INDEPENDENT_AMBULATORY_CARE_PROVIDER_SITE_OTHER): Payer: Self-pay

## 2021-05-15 ENCOUNTER — Other Ambulatory Visit (HOSPITAL_COMMUNITY): Payer: Self-pay

## 2021-05-15 ENCOUNTER — Inpatient Hospital Stay: Payer: Medicare Other | Attending: Internal Medicine | Admitting: Internal Medicine

## 2021-05-15 ENCOUNTER — Encounter: Payer: Self-pay | Admitting: Internal Medicine

## 2021-05-15 VITALS — BP 137/58 | HR 80 | Temp 97.7°F | Resp 16 | Wt 145.0 lb

## 2021-05-15 DIAGNOSIS — C9000 Multiple myeloma not having achieved remission: Secondary | ICD-10-CM

## 2021-05-15 DIAGNOSIS — E876 Hypokalemia: Secondary | ICD-10-CM | POA: Insufficient documentation

## 2021-05-15 DIAGNOSIS — E1165 Type 2 diabetes mellitus with hyperglycemia: Secondary | ICD-10-CM | POA: Insufficient documentation

## 2021-05-15 DIAGNOSIS — Z7984 Long term (current) use of oral hypoglycemic drugs: Secondary | ICD-10-CM | POA: Diagnosis not present

## 2021-05-15 DIAGNOSIS — Z833 Family history of diabetes mellitus: Secondary | ICD-10-CM | POA: Insufficient documentation

## 2021-05-15 LAB — CBC WITH DIFFERENTIAL/PLATELET
Abs Immature Granulocytes: 0.06 10*3/uL (ref 0.00–0.07)
Basophils Absolute: 0.1 10*3/uL (ref 0.0–0.1)
Basophils Relative: 1 %
Eosinophils Absolute: 0.1 10*3/uL (ref 0.0–0.5)
Eosinophils Relative: 1 %
HCT: 29.9 % — ABNORMAL LOW (ref 36.0–46.0)
Hemoglobin: 9.5 g/dL — ABNORMAL LOW (ref 12.0–15.0)
Immature Granulocytes: 1 %
Lymphocytes Relative: 23 %
Lymphs Abs: 2.1 10*3/uL (ref 0.7–4.0)
MCH: 31.3 pg (ref 26.0–34.0)
MCHC: 31.8 g/dL (ref 30.0–36.0)
MCV: 98.4 fL (ref 80.0–100.0)
Monocytes Absolute: 0.7 10*3/uL (ref 0.1–1.0)
Monocytes Relative: 8 %
Neutro Abs: 6.3 10*3/uL (ref 1.7–7.7)
Neutrophils Relative %: 66 %
Platelets: 339 10*3/uL (ref 150–400)
RBC: 3.04 MIL/uL — ABNORMAL LOW (ref 3.87–5.11)
RDW: 13.6 % (ref 11.5–15.5)
WBC: 9.4 10*3/uL (ref 4.0–10.5)
nRBC: 0 % (ref 0.0–0.2)

## 2021-05-15 LAB — COMPREHENSIVE METABOLIC PANEL
ALT: 21 U/L (ref 0–44)
AST: 18 U/L (ref 15–41)
Albumin: 2.9 g/dL — ABNORMAL LOW (ref 3.5–5.0)
Alkaline Phosphatase: 68 U/L (ref 38–126)
Anion gap: 5 (ref 5–15)
BUN: 32 mg/dL — ABNORMAL HIGH (ref 8–23)
CO2: 25 mmol/L (ref 22–32)
Calcium: 9.6 mg/dL (ref 8.9–10.3)
Chloride: 102 mmol/L (ref 98–111)
Creatinine, Ser: 1.1 mg/dL — ABNORMAL HIGH (ref 0.44–1.00)
GFR, Estimated: 49 mL/min — ABNORMAL LOW (ref >60)
Glucose, Bld: 243 mg/dL — ABNORMAL HIGH (ref 70–99)
Potassium: 3.8 mmol/L (ref 3.5–5.1)
Sodium: 132 mmol/L — ABNORMAL LOW (ref 135–145)
Total Bilirubin: 0.3 mg/dL (ref 0.3–1.2)
Total Protein: 6.6 g/dL (ref 6.5–8.1)

## 2021-05-15 LAB — PRETREATMENT RBC PHENOTYPE: DAT, IgG: NEGATIVE

## 2021-05-15 MED ORDER — ACYCLOVIR 400 MG PO TABS: 400.0000 mg | ORAL_TABLET | Freq: Two times a day (BID) | ORAL | 4 refills | Status: DC

## 2021-05-15 MED ORDER — MONTELUKAST SODIUM 10 MG PO TABS: 10.0000 mg | ORAL_TABLET | Freq: Every day | ORAL | 0 refills | Status: DC

## 2021-05-15 MED ORDER — DEXAMETHASONE 4 MG PO TABS: 4.0000 mg | ORAL_TABLET | Freq: Two times a day (BID) | ORAL | 3 refills | Status: DC

## 2021-05-15 NOTE — Progress Notes (Signed)
[place] NOTE  Patient Care Team: Juline Patch, MD as PCP - General (Family Medicine) Vladimir Faster, [facility] (Inactive) (Pharmacist) Cammie Sickle, MD as Consulting Physician (Oncology)  CHIEF COMPLAINTS/PURPOSE OF CONSULTATION: Multiple myeloma  Oncology History Overview Note  # MULTIPLE MYELOMA  [FEB 2023-hypercalcemia status changes]-Active [bone lesions; hypercalcemia; anemia; No renal insuffiencey;Bone marrow-32% plasma cell-   STANDARD Cytogenetics].FEB 2023- S/p dexamethasone 20 mg q day x4.   # FEB 2023-hypercalcemia [[facility]-status post calcitonin; bisphosphonate]  BONE MARROW, ASPIRATE, CLOT, CORE:  -Hypercellular bone marrow with plasma cell neoplasm  -See comment   PERIPHERAL BLOOD:  -Normocytic-normochromic anemia   COMMENT:   The bone marrow is hypercellular for age with increased number of  atypical plasma cells representing 32% of all cells in the aspirate  associated with interstitial infiltrates and numerous variably sized  clusters in the clot/biopsy sections.  The plasma cells display weak  kappa light chain restriction consistent with plasma cell neoplasm.  Correlation with cytogenetic and FISH studies is recommended   MICROSCOPIC DESCRIPTION:   PERIPHERAL BLOOD SMEAR: The red blood cells display mild  anisopoikilocytosis with mild polychromasia.  The white blood cells are  normal number with scattered hypogranular neutrophils. An occasional  myelocyte and large atypical mononuclear cells are seen on scan. The  platelets are normal in number.    Multiple myeloma not having achieved remission ([place])  05/15/2021 Initial Diagnosis   Multiple myeloma not having achieved remission ([place])   05/15/2021 -  Chemotherapy   Patient is on Treatment Plan : MYELOMA Daratumumab SQ + Lenalidomide + Dexamethasone (DaraRd) q28d       HISTORY OF PRESENTING ILLNESS: pt in a wheel chair; accompanied by her daughter.   Cristina Morgan 87 y.o.   female with multiple compression fractures in the back, history of diabetes hypertension-is here to review the results of the bone marrow biopsy that was ordered for concern for myeloma.  Patient was recently admitted to hospital for mental status changes/generalized weakness/poor appetite weight loss.-Hypercalcemia. Patient also received bisphosphonate/pamidronate; calcitonin.  Patient had a bone marrow biopsy-given the lytic lesions; given concerns for multiple myeloma because of elevated M protein; abnormal kappa lambda light chain ratio.    Patient is currently at home.  Patient continues to be fatigued.  Back pain is improved not resolved.  Review of Systems  Constitutional:  Positive for malaise/fatigue and weight loss. Negative for chills, diaphoresis and fever.  HENT:  Negative for nosebleeds and sore throat.   Eyes:  Negative for double vision.  Respiratory:  Negative for cough, hemoptysis, sputum production, shortness of breath and wheezing.   Cardiovascular:  Negative for chest pain, palpitations, orthopnea and leg swelling.  Gastrointestinal:  Negative for abdominal pain, blood in stool, constipation, diarrhea, heartburn, melena, nausea and vomiting.  Genitourinary:  Negative for dysuria, frequency and urgency.  Musculoskeletal:  Positive for back pain and joint pain.  Skin: Negative.  Negative for itching and rash.  Neurological:  Negative for dizziness, tingling, focal weakness, weakness and headaches.  Endo/Heme/Allergies:  Does not bruise/bleed easily.  Psychiatric/Behavioral:  Negative for depression. The patient is not nervous/anxious and does not have insomnia.     MEDICAL HISTORY:  Past Medical History:  Diagnosis Date   Anemia    CAD (coronary artery disease)    Cataracts, bilateral    Closed compression fracture of first lumbar vertebra (HCC)    Closed compression fracture of second lumbar vertebra (HCC)    Diabetes mellitus without  complication ([place])     High cholesterol    History of pneumonia 2010   [place]   History of uterine fibroid    Hypercalcemia    Hyperlipidemia    Hypertension    Lytic bone lesions on xray    Multiple myeloma (HCC)    Osteoporosis    Primary osteoarthritis of right knee     SURGICAL HISTORY: Past Surgical History:  Procedure Laterality Date   CATARACT EXTRACTION Bilateral 2014   COLONOSCOPY  2011   cleared for 5 yrs- [facility]   ECTOPIC PREGNANCY SURGERY      SOCIAL HISTORY: Social History   Socioeconomic History   Marital status: Married    Spouse name: Not on file   Number of children: 2   Years of education: Not on file   Highest education level: 12th grade  Occupational History   Occupation: Retired  Tobacco Use   Smoking status: Never    Passive exposure: Never   Smokeless tobacco: Never   Tobacco comments:    smoking cessation materials not required  Vaping Use   Vaping Use: Never used  Substance and Sexual Activity   Alcohol use: No    Alcohol/week: 0.0 standard drinks   Drug use: No   Sexual activity: Not Currently  Other Topics Concern   Not on file  Social History Narrative   [place] with husband; drives. Never smoked; no alcohol. Daughters live close.    Social Determinants of Health   Financial Resource Strain: Low Risk    Difficulty of Paying Living Expenses: Not hard at all  Food Insecurity: No Food Insecurity   Worried About Charity fundraiser in the Last Year: Never true   [place] in the Last Year: Never true  Transportation Needs: No Transportation Needs   Lack of Transportation (Medical): No   Lack of Transportation (Non-Medical): No  Physical Activity: Inactive   Days of Exercise per Week: 0 days   Minutes of Exercise per Session: 0 min  Stress: No Stress Concern Present   Feeling of Stress : Not at all  Social Connections: Moderately Integrated   Frequency of Communication with Friends and Family: More  than three times a week   Frequency of Social Gatherings with Friends and Family: More than three times a week   Attends Religious Services: More than 4 times per year   Active Member of [employer] or Organizations: No   Attends Music therapist: Never   Marital Status: Married  Human resources officer Violence: Not At Risk   Fear of Current or Ex-Partner: No   Emotionally Abused: No   Physically Abused: No   Sexually Abused: No    FAMILY HISTORY: Family History  Problem Relation Age of Onset   Cancer Mother        breast   Heart disease Mother    Diabetes Father    Heart disease Father    Heart attack Father    Heart attack Brother    Diabetes Brother     ALLERGIES:  is allergic to latex and penicillins.  MEDICATIONS:  Current Outpatient Medications  Medication Sig Dispense Refill   acetaminophen (TYLENOL) 325 MG tablet Take 650 mg by mouth every 6 (six) hours as needed.     acyclovir (ZOVIRAX) 400 MG tablet Take 1 tablet (400 mg total) by mouth 2 (two) times daily. 60 tablet 4   alendronate (FOSAMAX) 70 MG tablet Take 1 tablet (70 mg total) by mouth  every 7 (seven) days. Take with a full glass of water on an empty stomach. 4 tablet 0   aspirin 81 MG chewable tablet Chew 1 tablet (81 mg total) by mouth daily. 100 tablet 3   baclofen (LIORESAL) 20 MG tablet TAKE ONE TABLET BY MOUTH THREE times daily 30 tablet 0   Calcium Carbonate-Vit D-Min (CALCIUM 1200 PO) Take 1 capsule by mouth daily.     carvedilol (COREG) 3.125 MG tablet Take 1 tablet (3.125 mg total) by mouth 2 (two) times daily with a meal. 60 tablet 1   cholecalciferol (VITAMIN D3) 25 MCG (1000 UNIT) tablet Take 1,000 Units by mouth daily.     dexamethasone (DECADRON) 4 MG tablet Take 1 tablet (4 mg total) by mouth 2 (two) times daily. Start 2 days prior to infusion; Take it for 2 days. 60 tablet 3   feeding supplement (ENSURE ENLIVE / ENSURE PLUS) LIQD Take 237 mLs by mouth 2 (two) times  daily between meals. 237 mL 12   glipiZIDE (GLUCOTROL XL) 10 MG 24 hr tablet Take 10 mg by mouth daily.     hydrOXYzine (ATARAX) 10 MG tablet Take 1 tablet (10 mg total) by mouth at bedtime as needed. 90 tablet 1   losartan (COZAAR) 25 MG tablet Take 1 tablet by mouth daily.     lovastatin (MEVACOR) 40 MG tablet Take 1 tablet (40 mg total) by mouth every evening. 90 tablet 1   meloxicam (MOBIC) 15 MG tablet Take 1 tablet (15 mg total) by mouth daily. (Patient taking differently: Take 15 mg by mouth daily as needed.) 30 tablet 5   metFORMIN (GLUCOPHAGE) 1000 MG tablet TAKE ONE TABLET BY MOUTH TWICE DAILY 180 tablet 0   montelukast (SINGULAIR) 10 MG tablet Take 1 tablet (10 mg total) by mouth at bedtime. START 2 days prior to chemo infusion. Take For 2 days; Do NOT take on the day of infusion. 90 tablet 0   Multiple Vitamin (MULTIVITAMIN ADULT PO) Take by mouth.     Omega-3 Fatty Acids (FISH OIL) 1000 MG CAPS Take 1 capsule (1,000 mg total) by mouth daily. 100 capsule 3   ondansetron (ZOFRAN) 4 MG tablet Take 1 tablet (4 mg total) by mouth every 6 (six) hours as needed for nausea. 20 tablet 0   pantoprazole (PROTONIX) 40 MG tablet Take 1 tablet (40 mg total) by mouth daily. 90 tablet 1   sertraline (ZOLOFT) 25 MG tablet Take 1 tablet (25 mg total) by mouth daily. 90 tablet 1   No current facility-administered medications for this visit.      Marland Kitchen  PHYSICAL EXAMINATION:  Vitals:   05/15/21 1100  BP: (!) 137/58  Pulse: 80  Resp: 16  Temp: 97.7 F (36.5 C)   Filed Weights   05/15/21 1100  Weight: 145 lb (65.8 kg)    Physical Exam Vitals and nursing note reviewed.  HENT:     Head: Normocephalic and atraumatic.     Mouth/Throat:     Pharynx: Oropharynx is clear.  Eyes:     Extraocular Movements: Extraocular movements intact.     Pupils: Pupils are equal, round, and reactive to light.  Cardiovascular:     Rate and Rhythm: Normal rate and regular rhythm.  Pulmonary:      Comments: Decreased breath sounds bilaterally.  Abdominal:     Palpations: Abdomen is soft.  Musculoskeletal:        General: Normal range of motion.     Cervical back: Normal range  of motion.  Skin:    General: Skin is warm.  Neurological:     General: No focal deficit present.     Mental Status: She is alert and oriented to person, place, and time.  Psychiatric:        Behavior: Behavior normal.        Judgment: Judgment normal.     LABORATORY DATA:  I have reviewed the data as listed Lab Results  Component Value Date   WBC 9.4 05/15/2021   HGB 9.5 (L) 05/15/2021   HCT 29.9 (L) 05/15/2021   MCV 98.4 05/15/2021   PLT 339 05/15/2021   Recent Labs    07/24/20 1104 02/01/21 1502 02/18/21 1615 04/15/21 1549 05/01/21 0320 05/02/21 0609 05/06/21 0435 05/07/21 0312 05/15/21 1241  NA 139 138  --   --  135   < > 130* 133* 132*  K 4.1 4.3  --   --  3.7   < > 4.0 3.6 3.8  CL 102 101  --   --  105   < > 105 106 102  CO2 22 23  --   --  24   < > 18* 22 25  GLUCOSE 105* 178*  --   --  253*   < > 144* 80 243*  BUN 15 19  --   --  24*   < > 14 18 32*  CREATININE 0.74 0.84  --   --  0.85   < > 0.81 1.10* 1.10*  CALCIUM 9.9 10.7*   < >  --  11.4*   < > 8.0* 7.0* 9.6  GFRNONAA  --   --   --   --  >60   < > >60 49* 49*  PROT 7.6  --   --   --  7.8  --   --   --  6.6  ALBUMIN 4.4 4.3  --  4.4 3.8   < > 3.1* 2.4* 2.9*  AST 19 16  --  17 18  --   --   --  18  ALT 14 9  --  8 12  --   --   --  21  ALKPHOS 68 102  --  108 88  --   --   --  68  BILITOT 0.5 0.3  --  0.3 0.7  --   --   --  0.3  BILIDIR  --  0.12  --  0.10  --   --   --   --   --    < > = values in this interval not displayed.    RADIOGRAPHIC STUDIES: I have personally reviewed the radiological images as listed and agreed with the findings in the report. CT HEAD WO CONTRAST (5MM)  Result Date: 05/01/2021 CLINICAL DATA:  Mental status change, unknown cause EXAM: CT HEAD WITHOUT CONTRAST TECHNIQUE: Contiguous axial  images were obtained from the base of the skull through the vertex without intravenous contrast. RADIATION DOSE REDUCTION: This exam was performed according to the departmental dose-optimization program which includes automated exposure control, adjustment of the mA and/or kV according to patient size and/or use of iterative reconstruction technique. COMPARISON:  None. FINDINGS: Brain: No acute intracranial abnormality. Specifically, no hemorrhage, hydrocephalus, mass lesion, acute infarction, or significant intracranial injury. Vascular: No hyperdense vessel or unexpected calcification. Skull: No acute calvarial abnormality. Sinuses/Orbits: No acute findings Other: None IMPRESSION: No acute intracranial abnormality. Electronically Signed   By: Rolm Baptise M.D.  On: 05/01/2021 03:39   CT Chest Wo Contrast  Result Date: 05/01/2021 CLINICAL DATA:  Evaluate for pneumonia. EXAM: CT CHEST WITHOUT CONTRAST TECHNIQUE: Multidetector CT imaging of the chest was performed following the standard protocol without IV contrast. RADIATION DOSE REDUCTION: This exam was performed according to the departmental dose-optimization program which includes automated exposure control, adjustment of the mA and/or kV according to patient size and/or use of iterative reconstruction technique. COMPARISON:  None. FINDINGS: Cardiovascular: Mild cardiac enlargement. No pericardial effusion. Aortic atherosclerosis and coronary artery atherosclerotic calcifications. Increase caliber of the main pulmonary artery compatible with PA hypertension. Mediastinum/Nodes: No enlarged mediastinal or axillary lymph nodes. Thyroid gland, trachea, and esophagus demonstrate no significant findings. Lungs/Pleura: No pleural effusion. No airspace consolidation. Areas of subsegmental atelectasis noted within both lower lung zones and lingula. 4 mm perifissural nodule in the right middle lobe is identified, image 61/3. No suspicious nodule or mass identified.  Upper Abdomen: No acute findings within the imaged portions of the upper abdomen. Musculoskeletal: The bones are markedly osteopenic. There are innumerable cortical lucencies throughout the ribs, thoracic spine and sternum concerning for metastatic disease., image 76/6. The thoracic vertebral body heights are well preserved. There are innumerable bilateral rib fractures. Some of these appear chronic or subacute in nature where as others are more acute in appearance IMPRESSION: 1. No evidence for pneumonia. 2. Bilateral areas of lower lung zone subsegmental atelectasis. No signs of pneumonia. 3. Innumerable cortical lucencies throughout the ribs, thoracic spine and sternum. Imaging findings are worrisome for diffuse osseous metastasis, lesions of multiple myeloma or diffuse metabolic bone disorder. 4. Innumerable bilateral rib fractures. Some of these appear chronic or subacute in nature and others are more acute in appearance. 5. Increase caliber of the main pulmonary artery compatible with PA hypertension. 6. Coronary artery atherosclerotic calcifications. 7. 4 mm perifissural nodule in the right middle lobe is nonspecific. No follow-up needed if patient is low-risk. Non-contrast chest CT can be considered in 12 months if patient is high-risk. This recommendation follows the consensus statement: Guidelines for Management of Incidental Pulmonary Nodules Detected on CT Images: From the Fleischner Society 2017; Radiology 2017; 284:228-243. 8. Aortic Atherosclerosis (ICD10-I70.0). Electronically Signed   By: Kerby Moors M.D.   On: 05/01/2021 06:05   MR Pelvis W Wo Contrast  Result Date: 04/26/2021 CLINICAL DATA:  Cystic left adnexal lesion on recent ultrasound. Fibroids. Weight loss. EXAM: MRI PELVIS WITHOUT AND WITH CONTRAST TECHNIQUE: Multiplanar multisequence MR imaging of the pelvis was performed both before and after administration of intravenous contrast. CONTRAST:  37m GADAVIST GADOBUTROL 1 MMOL/ML IV  SOLN COMPARISON:  Ultrasound on 02/21/2021 FINDINGS: Lower Urinary Tract: Incompletely distended urinary bladder, but otherwise unremarkable. Bowel: Unremarkable pelvic bowel loops. Vascular/Lymphatic: Unremarkable. No pathologically enlarged pelvic lymph nodes identified. Reproductive: -- Uterus: Measures 4.5 x 2.8 by 3.9 cm (volume = 26 cm^3). A few small uterine fibroids are seen, largest measuring 1.4 cm in maximum diameter. No abnormal endometrial thickening seen. Cervix and vagina are unremarkable. -- Right ovary: A 1.5 cm simple cyst is seen in the right ovary. No other cystic or solid masses are identified. -- Left ovary: A complex cystic lesion is seen in the left ovary which measures 3.6 x 3.0 by 2.3 cm. This contains several thin internal septations, and a 8 mm solid mural nodule with probable mild contrast enhancement. This is consistent with a cystic ovarian neoplasm. Other: No peritoneal thickening or abnormal free fluid. A loculated fluid collection is noted within the left  inguinal canal which measures 3.6 x 1.6 cm, consistent with a hydrocele of the canal of Nuck. Musculoskeletal:  Unremarkable. IMPRESSION: 3.6 cm complex cystic lesion in the left ovary, with thin septations and small solid mural nodule, consistent with cystic ovarian neoplasm. Surgical evaluation should be considered. 1.5 cm benign-appearing cyst in the right ovary. A few small uterine fibroids, largest measuring 1.4 cm. Incidentally noted small left-sided hydrocele of the canal of Nuck. Electronically Signed   By: Marlaine Hind M.D.   On: 04/26/2021 18:44   DG Chest Portable 1 View  Result Date: 05/01/2021 CLINICAL DATA:  malaise, poorly responsive EXAM: PORTABLE CHEST 1 VIEW COMPARISON:  02/18/2021 FINDINGS: Cardiomegaly, vascular congestion. No overt edema. Bibasilar atelectasis. No effusions or pneumothorax. No acute bony abnormality. IMPRESSION: Cardiomegaly, vascular congestion. Electronically Signed   By: Rolm Baptise  M.D.   On: 05/01/2021 03:47   DG Bone Survey Met  Result Date: 05/02/2021 CLINICAL DATA:  Inpatient.  Hypercalcemia. EXAM: METASTATIC BONE SURVEY COMPARISON:  None. FINDINGS: Innumerable small lucent lesions throughout the skeleton, including the proximal and mid right humeral shaft, bilateral iliac bones, proximal metaphyses and shafts of the bilateral femora. Severe lower thoracic vertebral body compression fracture. Moderate L1 and severe L2 vertebral compression fractures. Moth eaten appearance to the ribs and spine. A few tiny lucent lesions overlying the calvarium. Acute or early subacute lateral left fourth and fifth rib fractures. Probable small calcified uterine fibroid overlying the sacrum to the right of midline. Mild to moderate bilateral knee osteoarthritis. IMPRESSION: 1. Innumerable small lucent lesions throughout the skeleton and moth eaten appearance to the ribs and spine, compatible with multiple myeloma. 2. Moderate L1 and severe L2 vertebral compression fractures. Severe lower thoracic vertebral compression fracture. 3. Acute or early subacute lateral left fourth and fifth rib fractures. Electronically Signed   By: Ilona Sorrel M.D.   On: 05/02/2021 10:04   CT BONE MARROW BIOPSY  Result Date: 05/06/2021 INDICATION: 87 year old female with history of hypercalcemia of malignancy presenting for bone marrow biopsy. EXAM: CT-GUIDED BONE MARROW BIOPSY AND ASPIRATION MEDICATIONS: None ANESTHESIA/SEDATION: Fentanyl 25 mcg IV; Versed 0 mg IV Sedation Time: 0 minutes; The patient was continuously monitored during the procedure by the interventional radiology nurse under my direct supervision. COMPLICATIONS: None immediate. PROCEDURE: Informed consent was obtained from the patient following an explanation of the procedure, risks, benefits and alternatives. The patient understands, agrees and consents for the procedure. All questions were addressed. A time out was performed prior to the initiation of  the procedure. The patient was positioned prone and non-contrast localization CT was performed of the pelvis to demonstrate the iliac marrow spaces. The operative site was prepped and draped in the usual sterile fashion. Under sterile conditions and local anesthesia, a 22 gauge spinal needle was utilized for procedural planning. Next, an 11 gauge coaxial bone biopsy needle was advanced into the right iliac marrow space. Needle position was confirmed with CT imaging. Initially, a bone marrow aspiration was performed. Next, a bone marrow biopsy was obtained with the 11 gauge outer bone marrow device. Samples were prepared with the cytotechnologist and deemed adequate. The needle was removed and superficial hemostasis was obtained with manual compression. A dressing was applied. The patient tolerated the procedure well without immediate post procedural complication. IMPRESSION: Successful CT guided right iliac bone marrow aspiration and core biopsy. Ruthann Cancer, MD [facility] Radiology Electronically Signed   By: Ruthann Cancer M.D.   On: 05/06/2021 12:22  ECHOCARDIOGRAM COMPLETE  Result Date: 05/03/2021    ECHOCARDIOGRAM REPORT   Patient Name:   Cristina Morgan Date of Exam: 05/03/2021 Medical Rec #:  [id number]       Height:       67.0 in Accession #:    [id number]      Weight:       134.0 lb Date of Birth:  01/15/1935       BSA:          1.706 m Patient Age:    4 years        BP:           161/80 mmHg Patient Gender: F               HR:           80 bpm. Exam Location:  [facility] Procedure: 2D Echo, Color Doppler and Cardiac Doppler Indications:     Supraventricular tachycardia  History:         Patient has no prior history of Echocardiogram examinations.                  Risk Factors:Hypertension, Diabetes and Dyslipidemia.  Sonographer:     Charmayne Sheer Referring Phys:  [place] Diagnosing Phys: Ida Rogue MD  Sonographer Comments: Suboptimal  subcostal window. IMPRESSIONS  1. Left ventricular ejection fraction, by estimation, is 60 to 65%. The left ventricle has normal function. The left ventricle has no regional wall motion abnormalities. Left ventricular diastolic parameters are consistent with Grade I diastolic dysfunction (impaired relaxation).  2. Right ventricular systolic function is normal. The right ventricular size is normal.  3. The mitral valve is normal in structure. Mild to moderate mitral valve regurgitation. No evidence of mitral stenosis.  4. Tricuspid valve regurgitation is mild to moderate.  5. The aortic valve is normal in structure. Aortic valve regurgitation is mild. No aortic stenosis is present.  6. The inferior vena cava is normal in size with greater than 50% respiratory variability, suggesting right atrial pressure of 3 mmHg. FINDINGS  Left Ventricle: Left ventricular ejection fraction, by estimation, is 60 to 65%. The left ventricle has normal function. The left ventricle has no regional wall motion abnormalities. The left ventricular internal cavity size was normal in size. There is  no left ventricular hypertrophy. Left ventricular diastolic parameters are consistent with Grade I diastolic dysfunction (impaired relaxation). Right Ventricle: The right ventricular size is normal. No increase in right ventricular wall thickness. Right ventricular systolic function is normal. Left Atrium: Left atrial size was normal in size. Right Atrium: Right atrial size was normal in size. Pericardium: There is no evidence of pericardial effusion. Mitral Valve: The mitral valve is normal in structure. Mild to moderate mitral valve regurgitation. No evidence of mitral valve stenosis. MV peak gradient, 3.3 mmHg. The mean mitral valve gradient is 1.0 mmHg. Tricuspid Valve: The tricuspid valve is normal in structure. Tricuspid valve regurgitation is mild to moderate. No evidence of tricuspid stenosis. Aortic Valve: The aortic valve is normal in  structure. Aortic valve regurgitation is mild. No aortic stenosis is present. Aortic valve mean gradient measures 4.0 mmHg. Aortic valve peak gradient measures 6.4 mmHg. Aortic valve area, by VTI measures 2.61 cm. Pulmonic Valve: The pulmonic valve was normal in structure. Pulmonic valve regurgitation is mild. No evidence of pulmonic stenosis. Aorta: The aortic root is normal in size and structure. Venous: The inferior vena cava is normal in size with greater than  50% respiratory variability, suggesting right atrial pressure of 3 mmHg. IAS/Shunts: No atrial level shunt detected by color flow Doppler.  LEFT VENTRICLE PLAX 2D LVIDd:         4.06 cm   Diastology LVIDs:         2.78 cm   LV e' medial:    4.79 cm/s LV PW:         1.18 cm   LV E/e' medial:  14.9 LV IVS:        0.97 cm   LV e' lateral:   6.20 cm/s LVOT diam:     2.10 cm   LV E/e' lateral: 11.5 LV SV:         68 LV SV Index:   40 LVOT Area:     3.46 cm  RIGHT VENTRICLE RV Basal diam:  4.04 cm LEFT ATRIUM             Index        RIGHT ATRIUM           Index LA diam:        3.90 cm 2.29 cm/m   RA Area:     13.40 cm LA Vol (A2C):   34.9 ml 20.46 ml/m  RA Volume:   33.00 ml  19.34 ml/m LA Vol (A4C):   59.1 ml 34.64 ml/m LA Biplane Vol: 48.9 ml 28.66 ml/m  AORTIC VALVE                    PULMONIC VALVE AV Area (Vmax):    2.49 cm     PV Vmax:          0.91 m/s AV Area (Vmean):   2.54 cm     PV Vmean:         68.700 cm/s AV Area (VTI):     2.61 cm     PV VTI:           0.177 m AV Vmax:           126.00 cm/s  PV Peak grad:     3.3 mmHg AV Vmean:          92.400 cm/s  PV Mean grad:     2.0 mmHg AV VTI:            0.259 m      PR End Diast Vel: 3.98 msec AV Peak Grad:      6.4 mmHg AV Mean Grad:      4.0 mmHg LVOT Vmax:         90.70 cm/s LVOT Vmean:        67.700 cm/s LVOT VTI:          0.195 m LVOT/AV VTI ratio: 0.75  AORTA Ao Root diam: 3.00 cm MITRAL VALVE               TRICUSPID VALVE MV Area (PHT): 4.52 cm    TR Peak grad:   26.0 mmHg MV Area VTI:    3.55 cm    TR Vmax:        255.00 cm/s MV Peak grad:  3.3 mmHg MV Mean grad:  1.0 mmHg    SHUNTS MV Vmax:       0.90 m/s    Systemic VTI:  0.20 m MV Vmean:      58.1 cm/s   Systemic Diam: 2.10 cm MV Decel Time: 168 msec MV E velocity: 71.35 cm/s MV A velocity: 74.80 cm/s MV E/A  ratio:  0.95 Ida Rogue MD Electronically signed by Ida Rogue MD Signature Date/Time: 05/03/2021/4:08:02 PM    Final     Multiple myeloma not having achieved remission ([place]) # MULTIPLE MYELOMA -Active [bone lesions; hypercalcemia; anemia; No renal insuffiencey;Bone marrow-32% plasma cell-   [employer]]. S/p dexamethasone 20 mg q day x4.  # Understands treatments are palliative; not curative; patient is not a transplant candidate.  Discussed multiple options available to the patient-family interested in aggressive therapies.  As per patient/family request we will-refer to [facility] for second opinion.  # Discussed the mechanism of action of each drug-Revlimid immunomodulatory; Daratumumab targeted therapy; dexamethasone-cytotoxic.  Discussed the potential side effects- Dara SQ-infusion reaction; risk of infections etc.;  Low blood counts etc. Revlimid-teratogenic side effects; skin rash diarrhea; thromboembolic events [aspirin prophylaxis]; dexamethasone-elevated blood sugars/weight gain fluid retention etc.    Also discussed that Dara SQ weekly for the first 2 months; and then every 2 weeks for the next 4 months; then every month.  Patient will need to be monitored postinfusion for the first 2 cycles.  Patient will also need premedication to avoid acute infusion reactions. Revlimid-15 mg 3 weeks on 1 week off.  Survey discussed with patient.  Discussed with pharmacy.   #History of diabetes-on oral hypoglycemic agents; continue to monitor closely on steroids; continue blood glucose control.  #Hypercalcemia secondary to multiple myeloma- [FEB 2023-Zometa; calcitonin]-calcium today is 9.6.  Monitor closely.  Zometa  ordered; possible zometa at next visit.  #DVT/shingles prophylaxis: Aspirin/acyclovir.  # CLINICAL trial: I introduced a clinical trial through [employer] " Blood Sample Collection to Evaluate Biomarkers in Subjects with Untreated Sold Tumors.  It would need extra vials of blood.  Also discussed that this would not anyway alter his treatment course/plan. Pt NOT interested.   [name]- [phone]  # DISPOSITION:  # referral to [facility] re: Multiple myeloma/second opinion # labs today- ordered.  # Revlimid education/survey  # chemo education dara SQ;Rev;Dex # follow up in 2 weeks- MD: labs- HOLD tube; cbc/cmp; DARA SQ-Dr.B  # 40 minutes face-to-face with the patient discussing the above plan of care; more than 50% of time spent on prognosis/ natural history; counseling and coordination. ------------------------------- # Take pre-medications as recommended prior to next treatment/chemo infusion. Start 2 days prior to treatment x 2 days. DO NOT TAKE ON THE DAY OF TREATMENT-  # Take tylenol 325 mg twice a day; [over-the-counter] # Singular 1 pill once a day [prescription] # Dexamethasone 4 mg twice daily. [Prescription]       All questions were answered. The patient knows to call the clinic with any problems, questions or concerns.       Cammie Sickle, MD 05/15/2021 2:23 PM

## 2021-05-15 NOTE — Progress Notes (Signed)
I spoke to patients granddaughter regarding the results of the blood work. Stable/improved. ? ?Informed that I sent prescriptions for pre-medication to start prior to Dara injections with instructions. Also recommend Tylenol 650 BID start two days prior to treatment. ? ?Also inform the patient regarding elevated blood sugars from underlying steroids. Discussed that this will need to be followed closely. Also checked with Dr. Wille Glaser es at the next appointment.

## 2021-05-15 NOTE — Telephone Encounter (Signed)
Oral Oncology Patient Advocate Encounter ? ?Prior Authorization for Revlimid has been approved.   ? ?PA# [id number] ?Effective dates: 05/15/21 until further notice. ? ?Patients co-pay is [phone] ? ?Oral Oncology Clinic will continue to follow.  ? ?Dennison Nancy CPHT ?Specialty Pharmacy Patient Advocate ?[place] ?Phone [phone] ?Fax [phone] ?05/15/2021 2:48 PM\ ? ?

## 2021-05-15 NOTE — Assessment & Plan Note (Addendum)
#  MULTIPLE MYELOMA -Active [bone lesions; hypercalcemia; anemia; No renal insuffiencey;Bone marrow-32% plasma cell-   [employer]]. S/p dexamethasone 20 mg q day x4. ? ?# Understands treatments are palliative; not curative; patient is not a transplant candidate.  Discussed multiple options available to the patient-family interested in aggressive therapies.  As per patient/family request we will-refer to [facility] for second opinion. ? ?# Discussed the mechanism of action of each drug-Revlimid immunomodulatory; Daratumumab targeted therapy; dexamethasone-cytotoxic.  Discussed the potential side effects- Dara SQ-infusion reaction; risk of infections etc.;  Low blood counts etc. Revlimid-teratogenic side effects; skin rash diarrhea; thromboembolic events [aspirin prophylaxis]; dexamethasone-elevated blood sugars/weight gain fluid retention etc. ? ?  Also discussed that Dara SQ weekly for the first 2 months; and then every 2 weeks for the next 4 months; then every month.  Patient will need to be monitored postinfusion for the first 2 cycles.  Patient will also need premedication to avoid acute infusion reactions. Revlimid-15 mg 3 weeks on 1 week off.  Survey discussed with patient.  Discussed with pharmacy.  ? ?#History of diabetes-on oral hypoglycemic agents; continue to monitor closely on steroids; continue blood glucose control. ? ?#Hypercalcemia secondary to multiple myeloma- [FEB 2023-Zometa; calcitonin]-calcium today is 9.6.  Monitor closely.  Zometa ordered; possible zometa at next visit. ? ?#DVT/shingles prophylaxis: Aspirin/acyclovir. ? ?# CLINICAL trial: I introduced a clinical trial through [employer] " Blood Sample Collection to Evaluate Biomarkers in Subjects with Untreated Sold Tumors.  It would need extra vials of blood.  Also discussed that this would not anyway alter his treatment course/plan. Pt NOT interested.  [name]- [phone] ? ?# DISPOSITION:  ?# referral to [facility] re: Multiple  myeloma/second opinion ?# labs today- ordered.  ?# Revlimid education/survey  ?# chemo education dara SQ;Rev;Dex ?# follow up in 2 weeks- MD: labs- HOLD tube; cbc/cmp; DARA SQ-Dr.B ? ?# 40 minutes face-to-face with the patient discussing the above plan of care; more than 50% of time spent on prognosis/ natural history; counseling and coordination. ?------------------------------- ?# Take pre-medications as recommended prior to next treatment/chemo infusion. Start 2 days prior to treatment x 2 days. DO NOT TAKE ON THE DAY OF TREATMENT-  ?# Take tylenol 325 mg twice a day; [over-the-counter] ?# Singular 1 pill once a day [prescription] ?# Dexamethasone 4 mg twice daily. [Prescription] ?  ? ? ?

## 2021-05-15 NOTE — Progress Notes (Signed)
Patient on plan of care prior to pathways. 

## 2021-05-15 NOTE — Telephone Encounter (Signed)
Oral Oncology Patient Advocate Encounter ?  ?Received notification from [facility] that prior authorization for Revlimid is required. ?  ?PA submitted on CoverMyMeds ?Key [id number] ?Status is pending ?  ?Oral Oncology Clinic will continue to follow. ? ?Dennison Nancy CPHT ?Specialty Pharmacy Patient Advocate ?[place] ?Phone [phone] ?Fax [phone] ?05/15/2021 1:32 PM ? ? ? ?

## 2021-05-15 NOTE — Progress Notes (Signed)
New patient evaluation.   

## 2021-05-15 NOTE — Progress Notes (Signed)
START ON PATHWAY REGIMEN - Multiple Myeloma and Other Plasma Cell Dyscrasias ? ? ?  Cycles 1 and 2: A cycle is every 28 days: ?    Lenalidomide  ?    Dexamethasone  ?    Daratumumab and hyaluronidase-fihj  ?  Cycles 3 through 6: A cycle is every 28 days: ?    Lenalidomide  ?    Dexamethasone  ?    Daratumumab and hyaluronidase-fihj  ?  Cycles 7 and beyond: A cycle is every 28 days: ?    Lenalidomide  ?    Dexamethasone  ?    Daratumumab and hyaluronidase-fihj  ? ?**Always confirm dose/schedule in your pharmacy ordering system** ? ?Patient Characteristics: ?Multiple Myeloma, Newly Diagnosed, Transplant Ineligible or Refused, Standard Risk ?Disease Classification: Multiple Myeloma ?R-ISS Staging: II ?Therapeutic Status: Newly Diagnosed ?Is Patient Eligible for Transplant<= Transplant Ineligible or Refused ?Risk Status: Standard Risk ?Intent of Therapy: ?Non-Curative / Palliative Intent, Discussed with Patient ?

## 2021-05-16 ENCOUNTER — Telehealth: Payer: Self-pay | Admitting: Family Medicine

## 2021-05-16 ENCOUNTER — Encounter (HOSPITAL_COMMUNITY): Payer: Self-pay | Admitting: Internal Medicine

## 2021-05-16 DIAGNOSIS — I10 Essential (primary) hypertension: Secondary | ICD-10-CM | POA: Diagnosis not present

## 2021-05-16 DIAGNOSIS — E119 Type 2 diabetes mellitus without complications: Secondary | ICD-10-CM | POA: Diagnosis not present

## 2021-05-16 DIAGNOSIS — E871 Hypo-osmolality and hyponatremia: Secondary | ICD-10-CM | POA: Diagnosis not present

## 2021-05-16 DIAGNOSIS — E7800 Pure hypercholesterolemia, unspecified: Secondary | ICD-10-CM | POA: Diagnosis not present

## 2021-05-16 DIAGNOSIS — M8448XD Pathological fracture, other site, subsequent encounter for fracture with routine healing: Secondary | ICD-10-CM | POA: Diagnosis not present

## 2021-05-16 NOTE — Telephone Encounter (Signed)
Called Stephanie left VM giving verbal orders. Name was stated on VM. ? ? ?KP ?

## 2021-05-16 NOTE — Telephone Encounter (Unsigned)
Copied from Jeff [phone]. Topic: Quick Communication - Home Health Verbal Orders ?>> May 16, 2021 12:52 PM Yvette Rack wrote: ?Caller/Agency: Tami Lin with [facility] ?Callback Number: [phone] ?Requesting OT/PT/Skilled Nursing/Social Work/Speech Therapy: OT ?Frequency: 1 time a week for 4 weeks ?

## 2021-05-17 ENCOUNTER — Encounter: Payer: Self-pay | Admitting: Family Medicine

## 2021-05-17 ENCOUNTER — Telehealth: Payer: Self-pay | Admitting: Pharmacist

## 2021-05-17 ENCOUNTER — Other Ambulatory Visit: Payer: Self-pay

## 2021-05-17 ENCOUNTER — Ambulatory Visit (INDEPENDENT_AMBULATORY_CARE_PROVIDER_SITE_OTHER): Payer: Medicare Other | Admitting: Family Medicine

## 2021-05-17 VITALS — BP 122/64 | HR 64 | Ht 66.0 in | Wt 145.0 lb

## 2021-05-17 DIAGNOSIS — E119 Type 2 diabetes mellitus without complications: Secondary | ICD-10-CM

## 2021-05-17 DIAGNOSIS — M1711 Unilateral primary osteoarthritis, right knee: Secondary | ICD-10-CM

## 2021-05-17 DIAGNOSIS — E7800 Pure hypercholesterolemia, unspecified: Secondary | ICD-10-CM | POA: Diagnosis not present

## 2021-05-17 DIAGNOSIS — C9000 Multiple myeloma not having achieved remission: Secondary | ICD-10-CM

## 2021-05-17 MED ORDER — LENALIDOMIDE 15 MG PO CAPS: 15.0000 mg | ORAL_CAPSULE | Freq: Every day | ORAL | 0 refills | Status: DC

## 2021-05-17 NOTE — Telephone Encounter (Signed)
Oral Oncology Pharmacist Encounter ? ?Received new prescription for Revlimid (lenalidomide) for the treatment of newly diagnosed multiple myeloma in conjunction with daratumumab and dexamethsone, planned duration until disease progression or unacceptable drug toxicity. ? ?CMP from 05/15/21 assessed, SCr slightly elevated, CrCl ~23m/min. Patient dose of Revlimid has been dose reduced for renal impairment. Prescription dose and frequency assessed.  ? ?Current medication list in Epic reviewed, no relevant DDIs with lenalidomide identified. ? ?Evaluated chart and no patient barriers to medication adherence identified.  ? ?[facility]. ? ?Oral Oncology Clinic will continue to follow for insurance authorization, copayment issues, initial counseling and start date. ? ? ?ADarl Pikes PharmD, BCPS, BCOP, CPP ?Hematology/Oncology Clinical Pharmacist Practitioner ?[place]/[facility] Oral Chemotherapy Navigation Clinic ?[phone]? ?05/17/2021 10:16 AM ? ?

## 2021-05-17 NOTE — Progress Notes (Signed)
Date:  05/17/2021   Name:  Cristina Morgan   DOB:  Aug 31, 1934   MRN:  [id number]   Chief Complaint: Follow-up (Starting chemo classes)  Diabetes She has type 2 diabetes mellitus. Her disease course has been stable. Pertinent negatives for hypoglycemia include no dizziness, headaches, nervousness/anxiousness, pallor, speech difficulty or sweats. There are no diabetic associated symptoms. Pertinent negatives for diabetes include no blurred vision, no chest pain, no fatigue, no foot paresthesias, no foot ulcerations, no polydipsia, no polyphagia, no polyuria, no visual change, no weakness and no weight loss. There are no hypoglycemic complications. Symptoms are stable. There are no diabetic complications. There are no known risk factors for coronary artery disease. Current diabetic treatment includes oral agent (dual therapy).   Lab Results  Component Value Date   NA 132 (L) 05/15/2021   K 3.8 05/15/2021   CO2 25 05/15/2021   GLUCOSE 243 (H) 05/15/2021   BUN 32 (H) 05/15/2021   CREATININE 1.10 (H) 05/15/2021   CALCIUM 9.6 05/15/2021   EGFR 68 02/01/2021   GFRNONAA 49 (L) 05/15/2021   Lab Results  Component Value Date   CHOL 176 07/24/2020   HDL 74 07/24/2020   LDLCALC 90 07/24/2020   TRIG 63 07/24/2020   CHOLHDL 2.7 03/01/2018   Lab Results  Component Value Date   TSH 0.367 05/03/2021   Lab Results  Component Value Date   HGBA1C 5.9 (H) 02/01/2021   Lab Results  Component Value Date   WBC 9.4 05/15/2021   HGB 9.5 (L) 05/15/2021   HCT 29.9 (L) 05/15/2021   MCV 98.4 05/15/2021   PLT 339 05/15/2021   Lab Results  Component Value Date   ALT 21 05/15/2021   AST 18 05/15/2021   ALKPHOS 68 05/15/2021   BILITOT 0.3 05/15/2021   No results found for: 25OHVITD2, 25OHVITD3, VD25OH   Review of Systems  Constitutional:  Negative for chills, fatigue, fever and weight loss.  HENT:  Negative for drooling, ear discharge, ear pain and sore throat.   Eyes:  Negative for blurred  vision.  Respiratory:  Negative for cough, shortness of breath and wheezing.   Cardiovascular:  Negative for chest pain, palpitations and leg swelling.  Gastrointestinal:  Negative for abdominal pain, blood in stool, constipation, diarrhea and nausea.  Endocrine: Negative for polydipsia, polyphagia and polyuria.  Genitourinary:  Negative for dysuria, frequency, hematuria and urgency.  Musculoskeletal:  Negative for back pain, myalgias and neck pain.  Skin:  Negative for pallor and rash.  Allergic/Immunologic: Negative for environmental allergies.  Neurological:  Negative for dizziness, speech difficulty, weakness and headaches.  Hematological:  Does not bruise/bleed easily.  Psychiatric/Behavioral:  Negative for suicidal ideas. The patient is not nervous/anxious.    Patient Active Problem List   Diagnosis Date Noted   Multiple myeloma not having achieved remission ([place]) 05/15/2021   Pressure injury of coccygeal region, stage 2 ([place]) 05/07/2021   Hyponatremia 05/04/2021   Pressure injury of skin 05/02/2021   Vitamin B12 deficiency 05/02/2021   Hypomagnesemia 05/02/2021   Hypophosphatemia 05/02/2021   SVT (supraventricular tachycardia) ([place]) 05/02/2021   Altered mental status 41/66/0630   Acute metabolic encephalopathy 16/03/930   Hypercalcemia 05/01/2021   Closed compression fracture of second lumbar vertebra ([place]) 10/28/2020   Need for vaccination against Streptococcus pneumoniae using pneumococcal conjugate vaccine 13 01/07/2017   Primary osteoarthritis of right knee 12/08/2016   PNA (pneumonia) 07/09/2015   Acute chest pain 03/20/2014   Type 2 diabetes mellitus ([place]) 03/20/2014  Hypercholesterolemia 03/20/2014   Essential hypertension 03/20/2014    Allergies  Allergen Reactions   Latex Rash   Penicillins Other (See Comments)    Past Surgical History:  Procedure Laterality Date   CATARACT EXTRACTION Bilateral 2014   COLONOSCOPY  2011   cleared for 5 yrs- [facility]    ECTOPIC PREGNANCY SURGERY      Social History   Tobacco Use   Smoking status: Never    Passive exposure: Never   Smokeless tobacco: Never   Tobacco comments:    smoking cessation materials not required  Vaping Use   Vaping Use: Never used  Substance Use Topics   Alcohol use: No    Alcohol/week: 0.0 standard drinks   Drug use: No     Medication list has been reviewed and updated.  No outpatient medications have been marked as taking for the 05/17/21 encounter (Office Visit) with Juline Patch, MD.    [facility] 2/9 Scores 05/17/2021 02/01/2021 12/12/2020 11/07/2020  PHQ - 2 Score 4 0 0 0  PHQ- 9 Score '8 2 4 ' -    GAD 7 : Generalized Anxiety Score 05/17/2021 02/01/2021 12/12/2020 07/24/2020  Nervous, Anxious, on Edge 0 0 0 1  Control/stop worrying 1 0 0 0  Worry too much - different things 1 0 0 0  Trouble relaxing 0 1 0 0  Restless 0 - 0 0  Easily annoyed or irritable 0 0 0 0  Afraid - awful might happen 0 0 0 0  Total GAD 7 Score 2 - 0 1  Anxiety Difficulty Not difficult at all - - Not difficult at all    BP Readings from Last 3 Encounters:  05/17/21 122/64  05/15/21 (!) 137/58  05/07/21 (!) 114/45    Physical Exam Vitals and nursing note reviewed.  Constitutional:      Appearance: She is well-developed.  HENT:     Head: Normocephalic.     Right Ear: Tympanic membrane, ear canal and external ear normal.     Left Ear: Tympanic membrane, ear canal and external ear normal.     Nose: Nose normal. No congestion or rhinorrhea.  Eyes:     General: Lids are everted, no foreign bodies appreciated. No scleral icterus.       Left eye: No foreign body or hordeolum.     Conjunctiva/sclera: Conjunctivae normal.     Right eye: Right conjunctiva is not injected.     Left eye: Left conjunctiva is not injected.     Pupils: Pupils are equal, round, and reactive to light.  Neck:     Thyroid: No thyromegaly.     Vascular: No JVD.     Trachea: No tracheal deviation.  Cardiovascular:      Rate and Rhythm: Normal rate and regular rhythm.     Heart sounds: Normal heart sounds. No murmur heard.   No friction rub. No gallop.  Pulmonary:     Effort: Pulmonary effort is normal. No respiratory distress.     Breath sounds: Normal breath sounds. No wheezing, rhonchi or rales.  Abdominal:     General: Bowel sounds are normal.  Musculoskeletal:        General: No tenderness.     Cervical back: Normal range of motion and neck supple.  Lymphadenopathy:     Cervical: No cervical adenopathy.  Skin:    General: Skin is warm.  Neurological:     Mental Status: She is alert.     Deep Tendon Reflexes: Reflexes normal.  Psychiatric:        Mood and Affect: Mood is not anxious or depressed.    Wt Readings from Last 3 Encounters:  05/17/21 145 lb (65.8 kg)  05/15/21 145 lb (65.8 kg)  05/07/21 139 lb 8.8 oz (63.3 kg)    BP 122/64    Pulse 64    Ht '5\' 6"'  (1.676 m)    Wt 145 lb (65.8 kg)    BMI 23.40 kg/m   Assessment and Plan:  1. Type 2 diabetes mellitus without complication, without long-term current use of insulin (HCC) Chronic.  Controlled.  Stable.  Given that oral intake is questionable at times and the risk for hypoglycemia may exceed the benefit I have elected to discontinue the glipizide and decrease metformin to 500 mg once in the morning to be increased to twice a day if blood sugars start running over 200 particularly if on Decadron.  2. Hypercholesterolemia We will continue on statin at this time but reinforced the she needs to be more concerned about taking calories and then limiting her at this time  3. Primary osteoarthritis of right knee Patient does have a history of arthritis and I do have concerns given the relatively low intake that the meloxicam may cause gastritis and we will discontinue at this time unless she becomes concerned about knee pain.  Aspirin intake was discussed as well and that the patient has never had a CVA or coronary event.  Given the  risk-benefit even if she did not have the current multiple myeloma there would be considerations of discontinuance.  I told her that it would be fine with me to discontinue aspirin but to run it by her oncologist first.

## 2021-05-20 ENCOUNTER — Telehealth: Payer: Self-pay | Admitting: Pharmacy Technician

## 2021-05-20 ENCOUNTER — Encounter: Payer: Self-pay | Admitting: Internal Medicine

## 2021-05-20 ENCOUNTER — Other Ambulatory Visit (HOSPITAL_COMMUNITY): Payer: Self-pay

## 2021-05-20 NOTE — Telephone Encounter (Signed)
Oral Oncology Patient Advocate Encounter ? ?Coordinated with patients daughter, [name], by email to complete application for [place] in an effort to reduce patient's out of pocket expense for Revlimid to $0.   ? ?Application completed and faxed to [phone].  ? ?[facility] Patient [place] phone number for follow up is [phone].  ? ?This encounter will be updated until final determination.  ? ?Dennison Nancy CPHT ?Specialty Pharmacy Patient Advocate ?[place] ?Phone [phone] ?Fax [phone] ?05/20/2021 2:18 PM ? ?

## 2021-05-21 ENCOUNTER — Inpatient Hospital Stay: Payer: Medicare Other

## 2021-05-21 ENCOUNTER — Inpatient Hospital Stay: Payer: Medicare Other | Admitting: Pharmacist

## 2021-05-21 ENCOUNTER — Other Ambulatory Visit: Payer: Self-pay

## 2021-05-21 DIAGNOSIS — C9000 Multiple myeloma not having achieved remission: Secondary | ICD-10-CM | POA: Diagnosis not present

## 2021-05-21 DIAGNOSIS — E1165 Type 2 diabetes mellitus with hyperglycemia: Secondary | ICD-10-CM | POA: Diagnosis not present

## 2021-05-21 DIAGNOSIS — Z833 Family history of diabetes mellitus: Secondary | ICD-10-CM | POA: Diagnosis not present

## 2021-05-21 DIAGNOSIS — E876 Hypokalemia: Secondary | ICD-10-CM | POA: Diagnosis not present

## 2021-05-21 DIAGNOSIS — Z7984 Long term (current) use of oral hypoglycemic drugs: Secondary | ICD-10-CM | POA: Diagnosis not present

## 2021-05-21 MED ORDER — MONTELUKAST SODIUM 10 MG PO TABS: 10.0000 mg | ORAL_TABLET | Freq: Every day | ORAL | 1 refills | Status: DC

## 2021-05-21 NOTE — Progress Notes (Signed)
[place]  [phone] [phone]  Patient Care Team: Juline Patch, MD as PCP - General (Family Medicine) Vladimir Faster, [facility] (Inactive) (Pharmacist) Cammie Sickle, MD as Consulting Physician (Oncology)   Name of the patient: Cristina Morgan  [id number]  11-27-1934   Date of visit: 05/21/21  HPI: Patient is a 87 y.o. female with newly diagnosed multiple myeloma Planned treatment with Revlimid (lenalidomide), daratumumab and dexamethasone. She is scheduled to start treatment on 05/30/21  Reason for Consult: Lenalidomide oral chemotherapy education.   PAST MEDICAL HISTORY: Past Medical History:  Diagnosis Date   Anemia    CAD (coronary artery disease)    Cataracts, bilateral    Closed compression fracture of first lumbar vertebra (HCC)    Closed compression fracture of second lumbar vertebra (HCC)    Diabetes mellitus without complication (HCC)    High cholesterol    History of pneumonia 2010   Legionnaires   History of uterine fibroid    Hypercalcemia    Hyperlipidemia    Hypertension    Lytic bone lesions on xray    Multiple myeloma (HCC)    Osteoporosis    Primary osteoarthritis of right knee     HEMATOLOGY/ONCOLOGY HISTORY:  Oncology History Overview Note  # MULTIPLE MYELOMA  [FEB 2023-hypercalcemia status changes]-Active [bone lesions; hypercalcemia; anemia; No renal insuffiencey;Bone marrow-32% plasma cell-   [employer]].FEB 2023- S/p dexamethasone 20 mg q day x4.   # FEB 2023-hypercalcemia [[facility]-status post calcitonin; bisphosphonate]  BONE MARROW, ASPIRATE, CLOT, CORE:  -Hypercellular bone marrow with plasma cell neoplasm  -See comment   PERIPHERAL BLOOD:  -Normocytic-normochromic anemia   COMMENT:   The bone marrow is hypercellular for age with increased number of  atypical plasma cells representing 32% of all cells in the aspirate  associated with interstitial  infiltrates and numerous variably sized  clusters in the clot/biopsy sections.  The plasma cells display weak  kappa light chain restriction consistent with plasma cell neoplasm.  Correlation with cytogenetic and FISH studies is recommended   MICROSCOPIC DESCRIPTION:   PERIPHERAL BLOOD SMEAR: The red blood cells display mild  anisopoikilocytosis with mild polychromasia.  The white blood cells are  normal number with scattered hypogranular neutrophils. An occasional  myelocyte and large atypical mononuclear cells are seen on scan. The  platelets are normal in number.    Multiple myeloma not having achieved remission ([place])  05/15/2021 Initial Diagnosis   Multiple myeloma not having achieved remission ([place])   05/15/2021 -  Chemotherapy   Patient is on Treatment Plan : MYELOMA Daratumumab SQ + Lenalidomide + Dexamethasone (DaraRd) q28d       ALLERGIES:  is allergic to latex and penicillins.  MEDICATIONS:  Current Outpatient Medications  Medication Sig Dispense Refill   acetaminophen (TYLENOL) 325 MG tablet Take 650 mg by mouth every 6 (six) hours as needed.     acyclovir (ZOVIRAX) 400 MG tablet Take 1 tablet (400 mg total) by mouth 2 (two) times daily. 60 tablet 4   aspirin 81 MG chewable tablet Chew 1 tablet (81 mg total) by mouth daily. 100 tablet 3   baclofen (LIORESAL) 20 MG tablet TAKE ONE TABLET BY MOUTH THREE times daily 30 tablet 0   Calcium Carbonate-Vit D-Min (CALCIUM 1200 PO) Take 1 capsule by mouth daily.     carvedilol (COREG) 3.125 MG tablet Take 1 tablet (3.125 mg total) by mouth 2 (two) times daily with a meal. 60 tablet 1  cholecalciferol (VITAMIN D3) 25 MCG (1000 UNIT) tablet Take 1,000 Units by mouth daily.     dexamethasone (DECADRON) 4 MG tablet Take 1 tablet (4 mg total) by mouth 2 (two) times daily. Start 2 days prior to infusion; Take it for 2 days. 60 tablet 3   feeding supplement (ENSURE ENLIVE / ENSURE PLUS) LIQD Take 237 mLs by mouth 2 (two) times daily between  meals. 237 mL 12   hydrOXYzine (ATARAX) 10 MG tablet Take 1 tablet (10 mg total) by mouth at bedtime as needed. 90 tablet 1   lenalidomide (REVLIMID) 15 MG capsule Take 1 capsule (15 mg total) by mouth daily. Take for 21 days, then hold for 7 days. Repeat every 28 days. 21 capsule 0   losartan (COZAAR) 25 MG tablet Take 1 tablet by mouth daily.     lovastatin (MEVACOR) 40 MG tablet Take 1 tablet (40 mg total) by mouth every evening. 90 tablet 1   metFORMIN (GLUCOPHAGE) 1000 MG tablet TAKE ONE TABLET BY MOUTH TWICE DAILY 180 tablet 0   montelukast (SINGULAIR) 10 MG tablet Take 1 tablet (10 mg total) by mouth daily. START 2 days prior to chemo infusion. Take For 2 days; Do NOT take on the day of infusion. 20 tablet 1   Multiple Vitamin (MULTIVITAMIN ADULT PO) Take by mouth.     Omega-3 Fatty Acids (FISH OIL) 1000 MG CAPS Take 1 capsule (1,000 mg total) by mouth daily. 100 capsule 3   ondansetron (ZOFRAN) 4 MG tablet Take 1 tablet (4 mg total) by mouth every 6 (six) hours as needed for nausea. 20 tablet 0   pantoprazole (PROTONIX) 40 MG tablet Take 1 tablet (40 mg total) by mouth daily. 90 tablet 1   sertraline (ZOLOFT) 25 MG tablet Take 1 tablet (25 mg total) by mouth daily. 90 tablet 1   No current facility-administered medications for this visit.    VITAL SIGNS: There were no vitals taken for this visit. There were no vitals filed for this visit.  Estimated body mass index is 23.4 kg/m as calculated from the following:   Height as of 05/17/21: '5\' 6"'  (1.676 m).   Weight as of 05/17/21: 65.8 kg (145 lb).  LABS: CBC:    Component Value Date/Time   WBC 9.4 05/15/2021 1241   HGB 9.5 (L) 05/15/2021 1241   HGB 9.5 (L) 04/15/2021 1549   HCT 29.9 (L) 05/15/2021 1241   HCT 29.8 (L) 04/15/2021 1549   PLT 339 05/15/2021 1241   PLT 291 04/15/2021 1549   MCV 98.4 05/15/2021 1241   MCV 98 (H) 04/15/2021 1549   NEUTROABS 6.3 05/15/2021 1241   NEUTROABS 3.7 04/15/2021 1549   LYMPHSABS 2.1  05/15/2021 1241   LYMPHSABS 2.6 04/15/2021 1549   MONOABS 0.7 05/15/2021 1241   EOSABS 0.1 05/15/2021 1241   EOSABS 0.1 04/15/2021 1549   BASOSABS 0.1 05/15/2021 1241   BASOSABS 0.0 04/15/2021 1549   Comprehensive Metabolic Panel:    Component Value Date/Time   NA 132 (L) 05/15/2021 1241   NA 138 02/01/2021 1502   K 3.8 05/15/2021 1241   CL 102 05/15/2021 1241   CO2 25 05/15/2021 1241   BUN 32 (H) 05/15/2021 1241   BUN 19 02/01/2021 1502   CREATININE 1.10 (H) 05/15/2021 1241   GLUCOSE 243 (H) 05/15/2021 1241   CALCIUM 9.6 05/15/2021 1241   CALCIUM 11.0 (H) 05/02/2021 0823   AST 18 05/15/2021 1241   ALT 21 05/15/2021 1241   ALKPHOS 68  05/15/2021 1241   BILITOT 0.3 05/15/2021 1241   BILITOT 0.3 04/15/2021 1549   PROT 6.6 05/15/2021 1241   PROT 7.6 07/24/2020 1104   ALBUMIN 2.9 (L) 05/15/2021 1241   ALBUMIN 4.4 04/15/2021 1549     Present during today's visit: patient and her daughter [name]  Start plan: Ms. Heard will start her treatment regimen on 05/30/21. Lenalidomide is still pending manufacturer assistance   Patient Education I spoke with patient for overview of new oral chemotherapy medication: lenalidomide   Administration: Counseled patient on administration, dosing, side effects, monitoring, drug-food interactions, safe handling, storage, and disposal. Patient will take lenalidomide 1 capsule (15 mg total) by mouth daily. Take for 21 days, then hold for 7 days. Repeat every 28 days.  Also reviewed the dosing and administration for acyclovir, aspirin, dexamethasone, acetaminophen, ondansetron, and montelukast. Montelukast went sent to the wrong pharmacy, redirected the rx to patient's preferred pharmacy [facility] Patient/daughter know that acetaminophen, dexamethasone, and montelukast are to be taking the 2 days prior to her infusion day to help prevent daratumumab infusion reaction.  Provided patient and daughter with medication calendar that covers the  following: lenalidomide, aspirin, acyclovir, dexamethasone, montelukast, and acetaminophen  Side Effects: Side effects include but not limited to: rash, constipation, diarrhea, decreased wbc/hgb/plt, nausea, fatigue.   Rash: Patient informed that rash most likely occurs with initiation and she should be on the look out for any rash. Instructed her to call the office if a rash occurs Bowel movement changes: Cassandra plan on picking up loperamide to use prn for diarrhea and Miralax prn for constipation Nausea: Ms. Michalski already has ondansetron on hand and has been using is prn now. Her daughter thinks that the nausea is coming from not eating as much and having some acid reflux. Offered a referral to our clinic dietitian, but Ms. Falin wanted to hold off on that for now.  Drug-drug Interactions (DDI): Currently no relevant DDIs with lenalidomide  Adherence: After discussion with patient no patient barriers to medication adherence identified.  Reviewed with patient importance of keeping a medication schedule and plan for any missed doses.  Ms. Depaz and [name] voiced understanding and appreciation. All questions answered. Medication handout provided.  Provided patient with Oral [facility] phone number. Patient knows to call the office with questions or concerns. Oral Chemotherapy Navigation Clinic will continue to follow.  Patient expressed understanding and was in agreement with this plan. She also understands that She can call clinic at any time with any questions, concerns, or complaints.   Medication Access Issues: Lenalidomide is pending manufacturer assistance approval  Follow-up plan: RTC next week for treatment initiation and lenalidomide re-education  Thank you for allowing me to participate in the care of this patient.   Time Total: 45 mins  Visit consisted of counseling and education on dealing with issues of symptom management in the setting of serious  and potentially life-threatening illness.Greater than 50%  of this time was spent counseling and coordinating care related to the above assessment and plan.  Signed by: Darl Pikes, PharmD, BCPS, Salley Slaughter, CPP Hematology/Oncology Clinical Pharmacist Practitioner [place]/DB/AP Oral [facility] [phone]  05/21/2021 1:12 PM

## 2021-05-22 DIAGNOSIS — I10 Essential (primary) hypertension: Secondary | ICD-10-CM | POA: Diagnosis not present

## 2021-05-22 DIAGNOSIS — E119 Type 2 diabetes mellitus without complications: Secondary | ICD-10-CM | POA: Diagnosis not present

## 2021-05-22 DIAGNOSIS — E7800 Pure hypercholesterolemia, unspecified: Secondary | ICD-10-CM | POA: Diagnosis not present

## 2021-05-22 DIAGNOSIS — M8448XD Pathological fracture, other site, subsequent encounter for fracture with routine healing: Secondary | ICD-10-CM | POA: Diagnosis not present

## 2021-05-22 DIAGNOSIS — E871 Hypo-osmolality and hyponatremia: Secondary | ICD-10-CM | POA: Diagnosis not present

## 2021-05-23 DIAGNOSIS — E7800 Pure hypercholesterolemia, unspecified: Secondary | ICD-10-CM | POA: Diagnosis not present

## 2021-05-23 DIAGNOSIS — M8448XD Pathological fracture, other site, subsequent encounter for fracture with routine healing: Secondary | ICD-10-CM | POA: Diagnosis not present

## 2021-05-23 DIAGNOSIS — I10 Essential (primary) hypertension: Secondary | ICD-10-CM | POA: Diagnosis not present

## 2021-05-23 DIAGNOSIS — E871 Hypo-osmolality and hyponatremia: Secondary | ICD-10-CM | POA: Diagnosis not present

## 2021-05-23 DIAGNOSIS — E119 Type 2 diabetes mellitus without complications: Secondary | ICD-10-CM | POA: Diagnosis not present

## 2021-05-24 DIAGNOSIS — E119 Type 2 diabetes mellitus without complications: Secondary | ICD-10-CM | POA: Diagnosis not present

## 2021-05-24 DIAGNOSIS — E871 Hypo-osmolality and hyponatremia: Secondary | ICD-10-CM | POA: Diagnosis not present

## 2021-05-24 DIAGNOSIS — M8448XD Pathological fracture, other site, subsequent encounter for fracture with routine healing: Secondary | ICD-10-CM | POA: Diagnosis not present

## 2021-05-24 DIAGNOSIS — I10 Essential (primary) hypertension: Secondary | ICD-10-CM | POA: Diagnosis not present

## 2021-05-24 DIAGNOSIS — E7800 Pure hypercholesterolemia, unspecified: Secondary | ICD-10-CM | POA: Diagnosis not present

## 2021-05-27 ENCOUNTER — Other Ambulatory Visit: Payer: Self-pay | Admitting: Pharmacist

## 2021-05-27 DIAGNOSIS — C9000 Multiple myeloma not having achieved remission: Secondary | ICD-10-CM

## 2021-05-27 MED ORDER — LENALIDOMIDE 15 MG PO CAPS: 15.0000 mg | ORAL_CAPSULE | Freq: Every day | ORAL | 0 refills | Status: DC

## 2021-05-27 NOTE — Progress Notes (Signed)
Oral Chemotherapy Pharmacist Encounter  ? ?Patient was approved for [employer] patient assistance. Revlimid Rx sent to [facility]. Called patient's duaghter and gave her phone number to [facility], instructed her to call tomorrow to set-up medication delivery. ? ?Darl Pikes, PharmD, BCPS, BCOP, CPP ?Hematology/Oncology Clinical Pharmacist ?[place]/DB/AP Oral Chemotherapy Navigation Clinic ?[phone] ? ?05/27/2021 11:38 AM ? ?

## 2021-05-28 ENCOUNTER — Telehealth: Payer: Self-pay

## 2021-05-28 ENCOUNTER — Telehealth: Payer: Self-pay | Admitting: Family Medicine

## 2021-05-28 ENCOUNTER — Other Ambulatory Visit: Payer: Self-pay | Admitting: Family Medicine

## 2021-05-28 DIAGNOSIS — E119 Type 2 diabetes mellitus without complications: Secondary | ICD-10-CM

## 2021-05-28 DIAGNOSIS — M545 Low back pain, unspecified: Secondary | ICD-10-CM

## 2021-05-28 DIAGNOSIS — I10 Essential (primary) hypertension: Secondary | ICD-10-CM

## 2021-05-28 DIAGNOSIS — E7800 Pure hypercholesterolemia, unspecified: Secondary | ICD-10-CM | POA: Diagnosis not present

## 2021-05-28 DIAGNOSIS — M8448XD Pathological fracture, other site, subsequent encounter for fracture with routine healing: Secondary | ICD-10-CM | POA: Diagnosis not present

## 2021-05-28 DIAGNOSIS — E871 Hypo-osmolality and hyponatremia: Secondary | ICD-10-CM | POA: Diagnosis not present

## 2021-05-28 DIAGNOSIS — S32000D Wedge compression fracture of unspecified lumbar vertebra, subsequent encounter for fracture with routine healing: Secondary | ICD-10-CM

## 2021-05-28 NOTE — Telephone Encounter (Signed)
Called to get PT 2x week and OT, skilled nursing x 5 weeks- gave the okay. Call back [phone] option 2 ?

## 2021-05-28 NOTE — Telephone Encounter (Unsigned)
Copied from [place] [id number]. Topic: Quick Communication - Home Health Verbal Orders ?>> May 28, 2021  1:30 PM Yvette Rack wrote: ?Caller/Agency: Tiffany with [facility] ?Callback Number: [phone] Option# 2 ?Requesting OT/PT/Skilled Nursing/Social Work/Speech Therapy: Skilled Nursing  ?Frequency: 1 time a week for 5 weeks ?

## 2021-05-29 ENCOUNTER — Inpatient Hospital Stay: Payer: Medicare Other | Admitting: Hospice and Palliative Medicine

## 2021-05-29 ENCOUNTER — Ambulatory Visit: Payer: Medicare Other | Admitting: Gastroenterology

## 2021-05-29 DIAGNOSIS — C9000 Multiple myeloma not having achieved remission: Secondary | ICD-10-CM

## 2021-05-29 NOTE — Progress Notes (Signed)
Multidisciplinary Oncology Council Documentation ? ?Cristina Morgan was presented by our [facility] on 05/29/2021, which included representatives from:  ?Palliative Care ?Dietitian  ?Physical/Occupational Therapist ?Nurse Navigator ?Genetics ?Speech Therapist ?Social work ?Survivorship RN ?Financial Navigator ?Research RN ? ? ?Cornell currently presents with history of multiple myeloma. ? ?We reviewed previous medical and familial history, history of present illness, and recent lab results along with all available histopathologic and imaging studies. The [place] considered available treatment options and made the following recommendations/referrals: ? ?Nutrition ?Social work ? ?The MOC is a meeting of clinicians from various specialty areas who evaluate and discuss patients for whom a multidisciplinary approach is being considered. Final determinations in the plan of care are those of the provider(s).  ? ?Today's extended care, comprehensive team conference, Cristina Morgan was not present for the discussion and was not examined.   ?

## 2021-05-29 NOTE — Progress Notes (Deleted)
? ? ?Primary Care Physician: Juline Patch, MD ? ?Primary Gastroenterologist:  Dr. Lucilla Lame ? ?No chief complaint on file. ? ? ?HPI: Cristina Morgan is a 87 y.o. female here for follow-up after seeing me back in December of last year. At that time the patient was being seen for weight loss and was recommended to undergo a CT scan of the abdomen and EGD and colonoscopy.  The patient was reluctant to do any that since she stated that her weight had been coming up and she would contact us if she decided to proceed with any of those tests. The patient eventually underwent an MRI that showed: ? ?IMPRESSION: ?Diffuse pancreatic ductal dilatation measuring up to 6 mm, with ?irregular contour. These findings are highly suspicious for chronic ?pancreatitis.  ?No evidence of pancreatic mass or signs of acute pancreatitis. ?No evidence of biliary ductal dilatation or choledocholithiasis ? ?The patient also underwent a CT scan of the pelvis that was abnormal and showed a complex cystic lesion consistent with a neoplasm. In February the patient had a CT scan of the chest that showed: ? ?Innumerable cortical lucencies throughout the ribs, thoracic ?spine and sternum. Imaging findings are worrisome for diffuse ?osseous metastasis, lesions of multiple myeloma or diffuse metabolic ?bone disorder. ? ?The patient is now following up with oncology and recently had a bone marrow biopsy. The bone marrow biopsy showed a plasma cell neoplasm. ? ?Past Medical History:  ?Diagnosis Date  ? Anemia   ? CAD (coronary artery disease)   ? Cataracts, bilateral   ? Closed compression fracture of first lumbar vertebra ([place])   ? Closed compression fracture of second lumbar vertebra ([place])   ? Diabetes mellitus without complication ([place]   ? High cholesterol   ? History of pneumonia 2010  ? Legionnaires  ? History of uterine fibroid   ? Hypercalcemia   ? Hyperlipidemia   ? Hypertension   ? Lytic bone lesions on xray   ? Multiple myeloma ([place])   ?  Osteoporosis   ? Primary osteoarthritis of right knee   ? ? ?Current Outpatient Medications  ?Medication Sig Dispense Refill  ? acetaminophen (TYLENOL) 325 MG tablet Take 650 mg by mouth every 6 (six) hours as needed.    ? acetaminophen (TYLENOL) 325 MG tablet Take 325 mg by mouth 2 (two) times daily. START 2 days prior to chemo infusion. Take For 2 days; Do NOT take on the day of infusion    ? acyclovir (ZOVIRAX) 400 MG tablet Take 1 tablet (400 mg total) by mouth 2 (two) times daily. 60 tablet 4  ? aspirin 81 MG chewable tablet Chew 1 tablet (81 mg total) by mouth daily. 100 tablet 3  ? baclofen (LIORESAL) 20 MG tablet TAKE ONE TABLET BY MOUTH three times daily 30 tablet 0  ? Calcium Carbonate-Vit D-Min (CALCIUM 1200 PO) Take 1 capsule by mouth daily.    ? carvedilol (COREG) 3.125 MG tablet Take 1 tablet (3.125 mg total) by mouth 2 (two) times daily with a meal. 60 tablet 1  ? cholecalciferol (VITAMIN D3) 25 MCG (1000 UNIT) tablet Take 1,000 Units by mouth daily.    ? dexamethasone (DECADRON) 4 MG tablet Take 1 tablet (4 mg total) by mouth 2 (two) times daily. Start 2 days prior to infusion; Take it for 2 days. 60 tablet 3  ? feeding supplement (ENSURE ENLIVE / ENSURE PLUS) LIQD Take 237 mLs by mouth 2 (two) times daily between meals. 237 mL 12  ?  hydrOXYzine (ATARAX) 10 MG tablet Take 1 tablet (10 mg total) by mouth at bedtime as needed. 90 tablet 1  ? lenalidomide (REVLIMID) 15 MG capsule Take 1 capsule (15 mg total) by mouth daily. Take for 21 days, then hold for 7 days. Repeat every 28 days. 21 capsule 0  ? losartan (COZAAR) 25 MG tablet TAKE ONE TABLET BY MOUTH EVERY EVENING 90 tablet 0  ? lovastatin (MEVACOR) 40 MG tablet Take 1 tablet (40 mg total) by mouth every evening. 90 tablet 1  ? metFORMIN (GLUCOPHAGE) 1000 MG tablet TAKE ONE TABLET BY MOUTH TWICE DAILY 180 tablet 0  ? montelukast (SINGULAIR) 10 MG tablet Take 1 tablet (10 mg total) by mouth daily. START 2 days prior to chemo infusion. Take For 2  days; Do NOT take on the day of infusion. 20 tablet 1  ? Multiple Vitamin (MULTIVITAMIN ADULT PO) Take by mouth.    ? Omega-3 Fatty Acids (FISH OIL) 1000 MG CAPS Take 1 capsule (1,000 mg total) by mouth daily. 100 capsule 3  ? ondansetron (ZOFRAN) 4 MG tablet Take 1 tablet (4 mg total) by mouth every 6 (six) hours as needed for nausea. 20 tablet 0  ? pantoprazole (PROTONIX) 40 MG tablet Take 1 tablet (40 mg total) by mouth daily. 90 tablet 1  ? sertraline (ZOLOFT) 25 MG tablet Take 1 tablet (25 mg total) by mouth daily. 90 tablet 1  ? ?No current facility-administered medications for this visit.  ? ? ?Allergies as of 05/29/2021 - Review Complete 05/17/2021  ?Allergen Reaction Noted  ? Latex Rash 11/12/2020  ? Penicillins Other (See Comments) 01/03/2015  ? ? ?ROS: ? ?General: Negative for anorexia, weight loss, fever, chills, fatigue, weakness. ?ENT: Negative for hoarseness, difficulty swallowing , nasal congestion. ?CV: Negative for chest pain, angina, palpitations, dyspnea on exertion, peripheral edema.  ?Respiratory: Negative for dyspnea at rest, dyspnea on exertion, cough, sputum, wheezing.  ?GI: See history of present illness. ?GU:  Negative for dysuria, hematuria, urinary incontinence, urinary frequency, nocturnal urination.  ?Endo: Negative for unusual weight change.  ?  ?Physical Examination: ? ? There were no vitals taken for this visit. ? ?General: Well-nourished, well-developed in no acute distress.  ?Eyes: No icterus. Conjunctivae pink. ?Lungs: Clear to auscultation bilaterally. Non-labored. ?Heart: Regular rate and rhythm, no murmurs rubs or gallops.  ?Abdomen: Bowel sounds are normal, nontender, nondistended, no hepatosplenomegaly or masses, no abdominal bruits or hernia , no rebound or guarding.   ?Extremities: No lower extremity edema. No clubbing or deformities. ?Neuro: Alert and oriented x 3.  Grossly intact. ?Skin: Warm and dry, no jaundice.   ?Psych: Alert and cooperative, normal mood and  affect. ? ?Labs:  ?  ?Imaging Studies: ?CT HEAD WO CONTRAST (5MM) ? ?Result Date: 05/01/2021 ?CLINICAL DATA:  Mental status change, unknown cause EXAM: CT HEAD WITHOUT CONTRAST TECHNIQUE: Contiguous axial images were obtained from the base of the skull through the vertex without intravenous contrast. RADIATION DOSE REDUCTION: This exam was performed according to the departmental dose-optimization program which includes automated exposure control, adjustment of the mA and/or kV according to patient size and/or use of iterative reconstruction technique. COMPARISON:  None. FINDINGS: Brain: No acute intracranial abnormality. Specifically, no hemorrhage, hydrocephalus, mass lesion, acute infarction, or significant intracranial injury. Vascular: No hyperdense vessel or unexpected calcification. Skull: No acute calvarial abnormality. Sinuses/Orbits: No acute findings Other: None IMPRESSION: No acute intracranial abnormality. Electronically Signed   By: Rolm Baptise M.D.   On: 05/01/2021 03:39  ? ?CT  Chest Wo Contrast ? ?Result Date: 05/01/2021 ?CLINICAL DATA:  Evaluate for pneumonia. EXAM: CT CHEST WITHOUT CONTRAST TECHNIQUE: Multidetector CT imaging of the chest was performed following the standard protocol without IV contrast. RADIATION DOSE REDUCTION: This exam was performed according to the departmental dose-optimization program which includes automated exposure control, adjustment of the mA and/or kV according to patient size and/or use of iterative reconstruction technique. COMPARISON:  None. FINDINGS: Cardiovascular: Mild cardiac enlargement. No pericardial effusion. Aortic atherosclerosis and coronary artery atherosclerotic calcifications. Increase caliber of the main pulmonary artery compatible with PA hypertension. Mediastinum/Nodes: No enlarged mediastinal or axillary lymph nodes. Thyroid gland, trachea, and esophagus demonstrate no significant findings. Lungs/Pleura: No pleural effusion. No airspace  consolidation. Areas of subsegmental atelectasis noted within both lower lung zones and lingula. 4 mm perifissural nodule in the right middle lobe is identified, image 61/3. No suspicious nodule or mass identified. Upper Abd

## 2021-05-29 NOTE — Telephone Encounter (Signed)
Oral Oncology Patient Advocate Encounter ? ?Received notification from [facility] that patient has been successfully enrolled into their program to receive Revlimid from the manufacturer at $0 out of pocket until 03/16/22.  ?  ?Patients daughter was called knows she will need to re-apply for next year.  ? ?Specialty Pharmacy that will dispense medication is [facility]. ? ?Patient knows to call the office with questions or concerns. ?  ?Oral Oncology Clinic will continue to follow. ? ?Dennison Nancy CPHT ?Specialty Pharmacy Patient Advocate ?[place] ?Phone [phone] ?Fax [phone] ?05/29/2021 2:46 PM ? ?

## 2021-05-30 ENCOUNTER — Encounter: Payer: Self-pay | Admitting: Internal Medicine

## 2021-05-30 ENCOUNTER — Inpatient Hospital Stay: Payer: Medicare Other | Admitting: Pharmacist

## 2021-05-30 ENCOUNTER — Inpatient Hospital Stay: Payer: Medicare Other

## 2021-05-30 ENCOUNTER — Other Ambulatory Visit: Payer: Self-pay

## 2021-05-30 ENCOUNTER — Telehealth: Payer: Self-pay | Admitting: Internal Medicine

## 2021-05-30 ENCOUNTER — Inpatient Hospital Stay (HOSPITAL_BASED_OUTPATIENT_CLINIC_OR_DEPARTMENT_OTHER): Payer: Medicare Other | Admitting: Internal Medicine

## 2021-05-30 DIAGNOSIS — Z7984 Long term (current) use of oral hypoglycemic drugs: Secondary | ICD-10-CM | POA: Diagnosis not present

## 2021-05-30 DIAGNOSIS — E1165 Type 2 diabetes mellitus with hyperglycemia: Secondary | ICD-10-CM | POA: Diagnosis not present

## 2021-05-30 DIAGNOSIS — C9000 Multiple myeloma not having achieved remission: Secondary | ICD-10-CM

## 2021-05-30 DIAGNOSIS — F329 Major depressive disorder, single episode, unspecified: Secondary | ICD-10-CM | POA: Diagnosis not present

## 2021-05-30 DIAGNOSIS — F5102 Adjustment insomnia: Secondary | ICD-10-CM | POA: Diagnosis not present

## 2021-05-30 DIAGNOSIS — Z833 Family history of diabetes mellitus: Secondary | ICD-10-CM | POA: Diagnosis not present

## 2021-05-30 DIAGNOSIS — E876 Hypokalemia: Secondary | ICD-10-CM | POA: Diagnosis not present

## 2021-05-30 LAB — CBC WITH DIFFERENTIAL/PLATELET
Abs Immature Granulocytes: 0.03 10*3/uL (ref 0.00–0.07)
Basophils Absolute: 0 10*3/uL (ref 0.0–0.1)
Basophils Relative: 0 %
Eosinophils Absolute: 0 10*3/uL (ref 0.0–0.5)
Eosinophils Relative: 0 %
HCT: 30.3 % — ABNORMAL LOW (ref 36.0–46.0)
Hemoglobin: 9.7 g/dL — ABNORMAL LOW (ref 12.0–15.0)
Immature Granulocytes: 0 %
Lymphocytes Relative: 24 %
Lymphs Abs: 2.1 10*3/uL (ref 0.7–4.0)
MCH: 31.2 pg (ref 26.0–34.0)
MCHC: 32 g/dL (ref 30.0–36.0)
MCV: 97.4 fL (ref 80.0–100.0)
Monocytes Absolute: 0.5 10*3/uL (ref 0.1–1.0)
Monocytes Relative: 6 %
Neutro Abs: 6.2 10*3/uL (ref 1.7–7.7)
Neutrophils Relative %: 70 %
Platelets: 355 10*3/uL (ref 150–400)
RBC: 3.11 MIL/uL — ABNORMAL LOW (ref 3.87–5.11)
RDW: 15.4 % (ref 11.5–15.5)
WBC: 8.9 10*3/uL (ref 4.0–10.5)
nRBC: 0 % (ref 0.0–0.2)

## 2021-05-30 LAB — COMPREHENSIVE METABOLIC PANEL
ALT: 12 U/L (ref 0–44)
AST: 25 U/L (ref 15–41)
Albumin: 3.6 g/dL (ref 3.5–5.0)
Alkaline Phosphatase: 117 U/L (ref 38–126)
Anion gap: 10 (ref 5–15)
BUN: 22 mg/dL (ref 8–23)
CO2: 19 mmol/L — ABNORMAL LOW (ref 22–32)
Calcium: 9 mg/dL (ref 8.9–10.3)
Chloride: 102 mmol/L (ref 98–111)
Creatinine, Ser: 1.04 mg/dL — ABNORMAL HIGH (ref 0.44–1.00)
GFR, Estimated: 53 mL/min — ABNORMAL LOW (ref >60)
Glucose, Bld: 382 mg/dL — ABNORMAL HIGH (ref 70–99)
Potassium: 4 mmol/L (ref 3.5–5.1)
Sodium: 131 mmol/L — ABNORMAL LOW (ref 135–145)
Total Bilirubin: 0.2 mg/dL — ABNORMAL LOW (ref 0.3–1.2)
Total Protein: 7.5 g/dL (ref 6.5–8.1)

## 2021-05-30 LAB — SAMPLE TO BLOOD BANK

## 2021-05-30 MED ORDER — SERTRALINE HCL 25 MG PO TABS: 25.0000 mg | ORAL_TABLET | Freq: Every day | ORAL | 1 refills | Status: DC

## 2021-05-30 NOTE — Progress Notes (Signed)
Needs refill of zoloft, order placed, needs signature. ? ?Pt has no appetite and nothing tasted good, would she benefit from a referral to [facility]? ?

## 2021-05-30 NOTE — Progress Notes (Signed)
Cristina Morgan ?CONSULT NOTE ? ?Patient Care Team: ?Juline Patch, MD as PCP - General (Family Medicine) ?Vladimir Faster, [place] (Inactive) (Pharmacist) ?Cammie Sickle, MD as Consulting Physician (Oncology) ? ?CHIEF COMPLAINTS/PURPOSE OF CONSULTATION: Multiple myeloma ? ?Oncology History Overview Note  ?# MULTIPLE MYELOMA  [FEB 2023-hypercalcemia status changes]-Active [bone lesions; hypercalcemia; anemia; No renal insuffiencey;Bone marrow-32% plasma cell-   STANDARD Cytogenetics].FEB 2023- S/p dexamethasone 20 mg q day x4. ? ? ?# FEB 2023-hypercalcemia [[facility]-status post calcitonin; bisphosphonate] ? ?BONE MARROW, ASPIRATE, CLOT, CORE:  ?-Hypercellular bone marrow with plasma cell neoplasm  ?-See comment  ? ?PERIPHERAL BLOOD:  ?-Normocytic-normochromic anemia  ? ?COMMENT:  ? ?The bone marrow is hypercellular for age with increased number of  ?atypical plasma cells representing 32% of all cells in the aspirate  ?associated with interstitial infiltrates and numerous variably sized  ?clusters in the clot/biopsy sections.  The plasma cells display weak  ?kappa light chain restriction consistent with plasma cell neoplasm.  ?Correlation with cytogenetic and FISH studies is recommended  ? ?MICROSCOPIC DESCRIPTION:  ? ?PERIPHERAL BLOOD SMEAR: The red blood cells display mild  ?anisopoikilocytosis with mild polychromasia.  The white blood cells are  ?normal number with scattered hypogranular neutrophils. An occasional  ?myelocyte and large atypical mononuclear cells are seen on scan. The  ?platelets are normal in number.  ?  ?Multiple myeloma not having achieved remission ([place])  ?05/15/2021 Initial Diagnosis  ? Multiple myeloma not having achieved remission ([place]) ?  ?05/30/2021 -  Chemotherapy  ? Patient is on Treatment Plan : MYELOMA Daratumumab SQ + Lenalidomide + Dexamethasone (DaraRd) q28d  ?   ? ? ?HISTORY OF PRESENTING ILLNESS: pt in a wheel chair; accompanied by her daughter.  ? ?Cristina Morgan 87  y.o.  female with multiple compression fractures in the back, history of diabetes hypertension-newly diagnosed multiple myeloma is here for follow-up/ proceed with Dara SQ today. ? ?Patient started Revlimid this morning.  She also took her premedications-for the dara [steroids; Singulair Tylenol Benadryl].  She states that she has been checking her blood sugars 3 times a day.  But does not have a log today ? ?Patient states her back pain is improved.  Denies any worsening joint pains.  No falls.  No further episodes of mental status changes.  ? ?Patient is currently at home.  Patient continues to be fatigued.   ? ?Review of Systems  ?Constitutional:  Positive for malaise/fatigue and weight loss. Negative for chills, diaphoresis and fever.  ?HENT:  Negative for nosebleeds and sore throat.   ?Eyes:  Negative for double vision.  ?Respiratory:  Negative for cough, hemoptysis, sputum production, shortness of breath and wheezing.   ?Cardiovascular:  Negative for chest pain, palpitations, orthopnea and leg swelling.  ?Gastrointestinal:  Negative for abdominal pain, blood in stool, constipation, diarrhea, heartburn, melena, nausea and vomiting.  ?Genitourinary:  Negative for dysuria, frequency and urgency.  ?Musculoskeletal:  Positive for back pain and joint pain.  ?Skin: Negative.  Negative for itching and rash.  ?Neurological:  Negative for dizziness, tingling, focal weakness, weakness and headaches.  ?Endo/Heme/Allergies:  Does not bruise/bleed easily.  ?Psychiatric/Behavioral:  Negative for depression. The patient is not nervous/anxious and does not have insomnia.    ? ?MEDICAL HISTORY:  ?Past Medical History:  ?Diagnosis Date  ?? Anemia   ?? CAD (coronary artery disease)   ?? Cataracts, bilateral   ?? Closed compression fracture of first lumbar vertebra ([place])   ?? Closed compression fracture of second  lumbar vertebra ([place])   ?? Diabetes mellitus without complication ([place])   ?? High cholesterol   ?? History of pneumonia  2010  ? Legionnaires  ?? History of uterine fibroid   ?? Hypercalcemia   ?? Hyperlipidemia   ?? Hypertension   ?? Lytic bone lesions on xray   ?? Multiple myeloma ([place])   ?? Osteoporosis   ?? Primary osteoarthritis of right knee   ? ? ?SURGICAL HISTORY: ?Past Surgical History:  ?Procedure Laterality Date  ?? CATARACT EXTRACTION Bilateral 2014  ?? COLONOSCOPY  2011  ? cleared for 5 yrs- [facility]  ?? ECTOPIC PREGNANCY SURGERY    ? ? ?SOCIAL HISTORY: ?Social History  ? ?Socioeconomic History  ?? Marital status: Married  ?  Spouse name: Not on file  ?? Number of children: 2  ?? Years of education: Not on file  ?? Highest education level: 12th grade  ?Occupational History  ?? Occupation: Retired  ?Tobacco Use  ?? Smoking status: Never  ?  Passive exposure: Never  ?? Smokeless tobacco: Never  ?? Tobacco comments:  ?  smoking cessation materials not required  ?Vaping Use  ?? Vaping Use: Never used  ?Substance and Sexual Activity  ?? Alcohol use: No  ?  Alcohol/week: 0.0 standard drinks  ?? Drug use: No  ?? Sexual activity: Not Currently  ?Other Topics Concern  ?? Not on file  ?Social History Narrative  ? [place] with husband; drives. Never smoked; no alcohol. Daughters live close.   ? ?Social Determinants of Health  ? ?Financial Resource Strain: Low Risk   ?? Difficulty of Paying Living Expenses: Not hard at all  ?Food Insecurity: No Food Insecurity  ?? Worried About Charity fundraiser in the Last Year: Never true  ?? Ran Out of Food in the Last Year: Never true  ?Transportation Needs: No Transportation Needs  ?? Lack of Transportation (Medical): No  ?? Lack of Transportation (Non-Medical): No  ?Physical Activity: Inactive  ?? Days of Exercise per Week: 0 days  ?? Minutes of Exercise per Session: 0 min  ?Stress: No Stress Concern Present  ?? Feeling of Stress : Not at all  ?Social Connections: Moderately Integrated  ?? Frequency of Communication with Friends and Family: More than three times a week  ?? Frequency of  Social Gatherings with Friends and Family: More than three times a week  ?? Attends Religious Services: More than 4 times per year  ?? Active Member of Clubs or Organizations: No  ?? Attends Archivist Meetings: Never  ?? Marital Status: Married  ?Intimate Partner Violence: Not At Risk  ?? Fear of Current or Ex-Partner: No  ?? Emotionally Abused: No  ?? Physically Abused: No  ?? Sexually Abused: No  ? ? ?FAMILY HISTORY: ?Family History  ?Problem Relation Age of Onset  ?? Cancer Mother   ?     breast  ?? Heart disease Mother   ?? Diabetes Father   ?? Heart disease Father   ?? Heart attack Father   ?? Heart attack Brother   ?? Diabetes Brother   ? ? ?ALLERGIES:  is allergic to latex and penicillins. ? ?MEDICATIONS:  ?Current Outpatient Medications  ?Medication Sig Dispense Refill  ?? acetaminophen (TYLENOL) 325 MG tablet Take 650 mg by mouth every 6 (six) hours as needed.    ?? acetaminophen (TYLENOL) 325 MG tablet Take 325 mg by mouth 2 (two) times daily. START 2 days prior to chemo infusion. Take For 2 days; Do  NOT take on the day of infusion    ?? acyclovir (ZOVIRAX) 400 MG tablet Take 1 tablet (400 mg total) by mouth 2 (two) times daily. 60 tablet 4  ?? aspirin 81 MG chewable tablet Chew 1 tablet (81 mg total) by mouth daily. 100 tablet 3  ?? baclofen (LIORESAL) 20 MG tablet TAKE ONE TABLET BY MOUTH three times daily 30 tablet 0  ?? Calcium Carbonate-Vit D-Min (CALCIUM 1200 PO) Take 1 capsule by mouth daily.    ?? carvedilol (COREG) 3.125 MG tablet Take 1 tablet (3.125 mg total) by mouth 2 (two) times daily with a meal. 60 tablet 1  ?? cholecalciferol (VITAMIN D3) 25 MCG (1000 UNIT) tablet Take 1,000 Units by mouth daily.    ?? dexamethasone (DECADRON) 4 MG tablet Take 1 tablet (4 mg total) by mouth 2 (two) times daily. Start 2 days prior to infusion; Take it for 2 days. 60 tablet 3  ?? Glucerna (GLUCERNA) LIQD Take 237 mLs by mouth.    ?? hydrOXYzine (ATARAX) 10 MG tablet Take 1 tablet (10 mg total) by  mouth at bedtime as needed. 90 tablet 1  ?? lenalidomide (REVLIMID) 15 MG capsule Take 1 capsule (15 mg total) by mouth daily. Take for 21 days, then hold for 7 days. Repeat every 28 days. 21 capsule 0  ??

## 2021-05-30 NOTE — Progress Notes (Signed)
? ?[facility] ?[place]  ?[phone] [phone] ? ?Patient Care Team: ?Juline Patch, MD as PCP - General (Family Medicine) ?Vladimir Faster, [place] (Inactive) (Pharmacist) ?Cammie Sickle, MD as Consulting Physician (Oncology)  ? ?Name of the patient: Cristina Morgan  ?[id number]  ?[birth year]  ? ?Date of visit: 05/30/21 ? ?HPI: Patient is a 87 y.o. female with newly diagnosed multiple myeloma Planned treatment with Revlimid (lenalidomide), daratumumab and dexamethasone. She is starting lenalidomide today 05/30/21 and MD is holding her daratumumab for 4 weeks due to hyperglycemia seen today in clinic. ? ?Reason for Consult: Lenalidomide oral chemotherapy re-education. ? ? ?PAST MEDICAL HISTORY: ?Past Medical History:  ?Diagnosis Date  ? Anemia   ? CAD (coronary artery disease)   ? Cataracts, bilateral   ? Closed compression fracture of first lumbar vertebra ([place])   ? Closed compression fracture of second lumbar vertebra ([place])   ? Diabetes mellitus without complication ([place])   ? High cholesterol   ? History of pneumonia 2010  ? Legionnaires  ? History of uterine fibroid   ? Hypercalcemia   ? Hyperlipidemia   ? Hypertension   ? Lytic bone lesions on xray   ? Multiple myeloma ([place])   ? Osteoporosis   ? Primary osteoarthritis of right knee   ? ? ?HEMATOLOGY/ONCOLOGY HISTORY:  ?Oncology History Overview Note  ?# MULTIPLE MYELOMA  [FEB 2023-hypercalcemia status changes]-Active [bone lesions; hypercalcemia; anemia; No renal insuffiencey;Bone marrow-32% plasma cell-   STANDARD Cytogenetics].FEB 2023- S/p dexamethasone 20 mg q day x4. ? ? ?# FEB 2023-hypercalcemia [[facility]-status post calcitonin; bisphosphonate] ? ?BONE MARROW, ASPIRATE, CLOT, CORE:  ?-Hypercellular bone marrow with plasma cell neoplasm  ?-See comment  ? ?PERIPHERAL BLOOD:  ?-Normocytic-normochromic anemia  ? ?COMMENT:  ? ?The bone marrow is hypercellular for age with increased number of  ?atypical plasma  cells representing 32% of all cells in the aspirate  ?associated with interstitial infiltrates and numerous variably sized  ?clusters in the clot/biopsy sections.  The plasma cells display weak  ?kappa light chain restriction consistent with plasma cell neoplasm.  ?Correlation with cytogenetic and FISH studies is recommended  ? ?MICROSCOPIC DESCRIPTION:  ? ?PERIPHERAL BLOOD SMEAR: The red blood cells display mild  ?anisopoikilocytosis with mild polychromasia.  The white blood cells are  ?normal number with scattered hypogranular neutrophils. An occasional  ?myelocyte and large atypical mononuclear cells are seen on scan. The  ?platelets are normal in number.  ?  ?Multiple myeloma not having achieved remission ([place])  ?05/15/2021 Initial Diagnosis  ? Multiple myeloma not having achieved remission ([place]) ?  ?05/30/2021 -  Chemotherapy  ? Patient is on Treatment Plan : MYELOMA Daratumumab SQ + Lenalidomide + Dexamethasone (DaraRd) q28d  ?   ? ? ?ALLERGIES:  is allergic to latex and penicillins. ? ?MEDICATIONS:  ?Current Outpatient Medications  ?Medication Sig Dispense Refill  ? acetaminophen (TYLENOL) 325 MG tablet Take 650 mg by mouth every 6 (six) hours as needed.    ? acetaminophen (TYLENOL) 325 MG tablet Take 325 mg by mouth 2 (two) times daily. START 2 days prior to chemo infusion. Take For 2 days; Do NOT take on the day of infusion    ? acyclovir (ZOVIRAX) 400 MG tablet Take 1 tablet (400 mg total) by mouth 2 (two) times daily. 60 tablet 4  ? aspirin 81 MG chewable tablet Chew 1 tablet (81 mg total) by mouth daily. 100 tablet 3  ? baclofen (LIORESAL) 20 MG tablet TAKE ONE TABLET  BY MOUTH three times daily 30 tablet 0  ? Calcium Carbonate-Vit D-Min (CALCIUM 1200 PO) Take 1 capsule by mouth daily.    ? carvedilol (COREG) 3.125 MG tablet Take 1 tablet (3.125 mg total) by mouth 2 (two) times daily with a meal. 60 tablet 1  ? cholecalciferol (VITAMIN D3) 25 MCG (1000 UNIT) tablet Take 1,000 Units by mouth daily.    ?  dexamethasone (DECADRON) 4 MG tablet Take 1 tablet (4 mg total) by mouth 2 (two) times daily. Start 2 days prior to infusion; Take it for 2 days. 60 tablet 3  ? Glucerna (GLUCERNA) LIQD Take 237 mLs by mouth.    ? hydrOXYzine (ATARAX) 10 MG tablet Take 1 tablet (10 mg total) by mouth at bedtime as needed. 90 tablet 1  ? lenalidomide (REVLIMID) 15 MG capsule Take 1 capsule (15 mg total) by mouth daily. Take for 21 days, then hold for 7 days. Repeat every 28 days. 21 capsule 0  ? losartan (COZAAR) 25 MG tablet TAKE ONE TABLET BY MOUTH EVERY EVENING 90 tablet 0  ? lovastatin (MEVACOR) 40 MG tablet Take 1 tablet (40 mg total) by mouth every evening. 90 tablet 1  ? metFORMIN (GLUCOPHAGE) 1000 MG tablet TAKE ONE TABLET BY MOUTH TWICE DAILY 180 tablet 0  ? montelukast (SINGULAIR) 10 MG tablet Take 1 tablet (10 mg total) by mouth daily. START 2 days prior to chemo infusion. Take For 2 days; Do NOT take on the day of infusion. 20 tablet 1  ? Multiple Vitamin (MULTIVITAMIN ADULT PO) Take by mouth.    ? Omega-3 Fatty Acids (FISH OIL) 1000 MG CAPS Take 1 capsule (1,000 mg total) by mouth daily. 100 capsule 3  ? ondansetron (ZOFRAN) 4 MG tablet Take 1 tablet (4 mg total) by mouth every 6 (six) hours as needed for nausea. 20 tablet 0  ? pantoprazole (PROTONIX) 40 MG tablet Take 1 tablet (40 mg total) by mouth daily. 90 tablet 1  ? sertraline (ZOLOFT) 25 MG tablet Take 1 tablet (25 mg total) by mouth daily. 90 tablet 1  ? ?No current facility-administered medications for this visit.  ? ? ?VITAL SIGNS: ?There were no vitals taken for this visit. ?There were no vitals filed for this visit.  ?Estimated body mass index is 22.32 kg/m? as calculated from the following: ?  Height as of an earlier encounter on 05/30/21: '5\' 6"'  (1.676 m). ?  Weight as of an earlier encounter on 05/30/21: 62.7 kg (138 lb 4.8 oz). ? ?LABS: ?CBC: ?   ?Component Value Date/Time  ? WBC 8.9 05/30/2021 0858  ? HGB 9.7 (L) 05/30/2021 0858  ? HGB 9.5 (L) 04/15/2021  1549  ? HCT 30.3 (L) 05/30/2021 0858  ? HCT 29.8 (L) 04/15/2021 1549  ? PLT 355 05/30/2021 0858  ? PLT 291 04/15/2021 1549  ? MCV 97.4 05/30/2021 0858  ? MCV 98 (H) 04/15/2021 1549  ? NEUTROABS 6.2 05/30/2021 0858  ? NEUTROABS 3.7 04/15/2021 1549  ? LYMPHSABS 2.1 05/30/2021 0858  ? LYMPHSABS 2.6 04/15/2021 1549  ? MONOABS 0.5 05/30/2021 0858  ? EOSABS 0.0 05/30/2021 0858  ? EOSABS 0.1 04/15/2021 1549  ? BASOSABS 0.0 05/30/2021 0858  ? BASOSABS 0.0 04/15/2021 1549  ? ?Comprehensive Metabolic Panel: ?   ?Component Value Date/Time  ? NA 131 (L) 05/30/2021 0858  ? NA 138 02/01/2021 1502  ? K 4.0 05/30/2021 0858  ? CL 102 05/30/2021 0858  ? CO2 19 (L) 05/30/2021 0858  ? BUN 22  05/30/2021 0858  ? BUN 19 02/01/2021 1502  ? CREATININE 1.04 (H) 05/30/2021 0858  ? GLUCOSE 382 (H) 05/30/2021 0858  ? CALCIUM 9.0 05/30/2021 0858  ? CALCIUM 11.0 (H) 05/02/2021 5003  ? AST 25 05/30/2021 0858  ? ALT 12 05/30/2021 0858  ? ALKPHOS 117 05/30/2021 0858  ? BILITOT 0.2 (L) 05/30/2021 0858  ? BILITOT 0.3 04/15/2021 1549  ? PROT 7.5 05/30/2021 0858  ? PROT 7.6 07/24/2020 1104  ? ALBUMIN 3.6 05/30/2021 0858  ? ALBUMIN 4.4 04/15/2021 1549  ? ? ? ?Present during today's visit: patient and her daughter [name] ? ?Start plan: Ms. Daughtry started her lenalidomide today 05/30/21. MD is holding her daratumumab for 4 weeks due to hyperglycemia seen today in clinic. ?  ?Patient Education ?I spoke with patient for refresher education of new oral chemotherapy medication: lenalidomide  ? ?Administration: ?Counseled patient on administration, dosing, side effects, monitoring, drug-food interactions, safe handling, storage, and disposal. ?Patient will take lenalidomide 1 capsule (15 mg total) by mouth daily. Take for 21 days, then hold for 7 days. Repeat every 28 days. ? ?Side Effects: ?Side effects include but not limited to: rash, constipation, diarrhea, decreased wbc/hgb/plt, nausea, fatigue.   ?Rash: Patient informed that rash most likely occurs with  initiation and she should be on the look out for any rash. Instructed her to call the office if a rash occurs ?Bowel movement changes: She knows to look out for both constipation and diarrhea. Currently she is

## 2021-05-30 NOTE — Telephone Encounter (Signed)
I spoke with Dr. Ronnald Ramp regarding elevated blood sugars.  Plan for dexamethasone 16 mg total weely[8 mg-Tues& 8 mg wed].  Dr. Ronnald Ramp concerned about patient's ability to check blood sugars frequently/sliding scale.  Dr. Ronnald Ramp plan to start-low-dose long-acting.  Appreciate the call/for managing diabetes. ?

## 2021-05-30 NOTE — Patient Instructions (Addendum)
#  Take dexamethasone 2 pills twice a day for 2 days [Tuesdays Wednesdays]. [Once a week]-watch out for elevated blood sugars for the next 2 to 3 days. ? ?#Do not have to take Singulair/Tylenol/Benadryl-as this is  more for your Dara/chemo treatment ? ?#Check blood sugars 3 times a day; reach out to Dr. Ronnald Ramp regarding further instructions for insulin sliding scale. ?

## 2021-05-30 NOTE — Assessment & Plan Note (Addendum)
#  MULTIPLE MYELOMA -Active [bone lesions; hypercalcemia; anemia; No renal insuffiencey;Bone marrow-32% plasma cell-   STANDARD Cytogenetics]. Declines [facility] for second opinion.  ? ?# HOLD Dara SQ today because of elevated blood sugars 384. Continue -Rev 15 mg 3 weeks on 1 week off-Dex-16 mg [Mon&Tues] weekly.  We will plan to start Dara SQ infusions in 4 weeks with cycle #2.  For now patient will continue R-D.  ? ?#Diabetes-on oral hypoglycemic agents; BG-382 poorly controlled.  I have reached out to the patient's PCP regarding sliding scale insulin-  blood glucose control.  Reminded the daughter to check the blood sugar 3 times a day. ? ?#Hypercalcemia secondary to multiple myeloma- [FEB 2023-Zometa; calcitonin]-calcium today is 9.26  Monitor closely.  Zometa 3 mg possibly in 2 weeks.  ? ?#DVT/shingles prophylaxis: Aspirin/acyclovir.  ? ?# IV access:  ? [name]- [phone] ? ?# DISPOSITION:  ?# HOLD dara SQ today;  ?# follow up in 2 weeks- NP/MD: labs- HOLD tube; cbc/cmp; zometa ?# follow up in 4 weeks- MD; labs- cbc/cmp;MM panel;K/l light chain; possible Zometa;Dara SQ-Dr.B ? ? ? ? ?

## 2021-05-31 DIAGNOSIS — E871 Hypo-osmolality and hyponatremia: Secondary | ICD-10-CM | POA: Diagnosis not present

## 2021-05-31 DIAGNOSIS — E7800 Pure hypercholesterolemia, unspecified: Secondary | ICD-10-CM | POA: Diagnosis not present

## 2021-05-31 DIAGNOSIS — M8448XD Pathological fracture, other site, subsequent encounter for fracture with routine healing: Secondary | ICD-10-CM | POA: Diagnosis not present

## 2021-05-31 DIAGNOSIS — I10 Essential (primary) hypertension: Secondary | ICD-10-CM | POA: Diagnosis not present

## 2021-05-31 DIAGNOSIS — E119 Type 2 diabetes mellitus without complications: Secondary | ICD-10-CM | POA: Diagnosis not present

## 2021-06-02 ENCOUNTER — Other Ambulatory Visit: Payer: Self-pay | Admitting: Internal Medicine

## 2021-06-03 ENCOUNTER — Encounter: Payer: Self-pay | Admitting: Family Medicine

## 2021-06-03 ENCOUNTER — Other Ambulatory Visit: Payer: Self-pay

## 2021-06-03 ENCOUNTER — Ambulatory Visit (INDEPENDENT_AMBULATORY_CARE_PROVIDER_SITE_OTHER): Payer: Medicare Other | Admitting: Family Medicine

## 2021-06-03 VITALS — BP 130/62 | HR 80 | Ht 66.0 in | Wt 129.0 lb

## 2021-06-03 DIAGNOSIS — E119 Type 2 diabetes mellitus without complications: Secondary | ICD-10-CM

## 2021-06-03 MED ORDER — LANTUS SOLOSTAR 100 UNIT/ML ~~LOC~~ SOPN: 5.0000 [IU] | PEN_INJECTOR | Freq: Every day | SUBCUTANEOUS | 99 refills | Status: DC

## 2021-06-03 NOTE — Progress Notes (Signed)
? ? ?Date:  06/03/2021  ? ?Name:  Cristina Morgan   DOB:  [birth year]   MRN:  [id number] ? ? ?Chief Complaint: Diabetes (Mid 200s) ? ?Diabetes ?She presents for her follow-up diabetic visit. She has type 2 diabetes mellitus. Her disease course has been fluctuating. There are no hypoglycemic associated symptoms. Pertinent negatives for hypoglycemia include no dizziness, headaches or nervousness/anxiousness. Associated symptoms include polyuria. Pertinent negatives for diabetes include no chest pain and no polydipsia. Symptoms are stable. There are no diabetic complications. Current diabetic treatment includes oral agent (monotherapy). Meal planning includes avoidance of concentrated sweets. Her breakfast blood glucose range is generally 180-200 mg/dl.  ? ?Lab Results  ?Component Value Date  ? NA 131 (L) 05/30/2021  ? K 4.0 05/30/2021  ? CO2 19 (L) 05/30/2021  ? GLUCOSE 382 (H) 05/30/2021  ? BUN 22 05/30/2021  ? CREATININE 1.04 (H) 05/30/2021  ? CALCIUM 9.0 05/30/2021  ? EGFR 68 02/01/2021  ? GFRNONAA 53 (L) 05/30/2021  ? ?Lab Results  ?Component Value Date  ? CHOL 176 07/24/2020  ? HDL 74 07/24/2020  ? [place] 90 07/24/2020  ? TRIG 63 07/24/2020  ? CHOLHDL 2.7 03/01/2018  ? ?Lab Results  ?Component Value Date  ? TSH 0.367 05/03/2021  ? ?Lab Results  ?Component Value Date  ? HGBA1C 5.9 (H) 02/01/2021  ? ?Lab Results  ?Component Value Date  ? WBC 8.9 05/30/2021  ? HGB 9.7 (L) 05/30/2021  ? HCT 30.3 (L) 05/30/2021  ? MCV 97.4 05/30/2021  ? PLT 355 05/30/2021  ? ?Lab Results  ?Component Value Date  ? ALT 12 05/30/2021  ? AST 25 05/30/2021  ? ALKPHOS 117 05/30/2021  ? BILITOT 0.2 (L) 05/30/2021  ? ?No results found for: 25OHVITD2, [place], VD25OH  ? ?Review of Systems  ?Constitutional:  Negative for chills and fever.  ?HENT:  Negative for drooling, ear discharge, ear pain and sore throat.   ?Respiratory:  Negative for cough, shortness of breath and wheezing.   ?Cardiovascular:  Negative for chest pain, palpitations and leg  swelling.  ?Gastrointestinal:  Negative for abdominal pain, blood in stool, constipation, diarrhea and nausea.  ?Endocrine: Positive for polyuria. Negative for polydipsia.  ?Genitourinary:  Negative for dysuria, frequency, hematuria and urgency.  ?Musculoskeletal:  Negative for back pain, myalgias and neck pain.  ?Skin:  Negative for rash.  ?Allergic/Immunologic: Negative for environmental allergies.  ?Neurological:  Negative for dizziness and headaches.  ?Hematological:  Does not bruise/bleed easily.  ?Psychiatric/Behavioral:  Negative for suicidal ideas. The patient is not nervous/anxious.   ? ?Patient Active Problem List  ? Diagnosis Date Noted  ? Multiple myeloma not having achieved remission ([place]) 05/15/2021  ? Pressure injury of coccygeal region, stage 2 ([place]) 05/07/2021  ? Hyponatremia 05/04/2021  ? Pressure injury of skin 05/02/2021  ? Vitamin B12 deficiency 05/02/2021  ? Hypomagnesemia 05/02/2021  ? Hypophosphatemia 05/02/2021  ? SVT (supraventricular tachycardia) ([place]) 05/02/2021  ? Altered mental status 05/01/2021  ? Acute metabolic encephalopathy 47/42/5956  ? Hypercalcemia 05/01/2021  ? Closed compression fracture of second lumbar vertebra ([place]) 10/28/2020  ? Need for vaccination against Streptococcus pneumoniae using pneumococcal conjugate vaccine 13 01/07/2017  ? Primary osteoarthritis of right knee 12/08/2016  ? PNA (pneumonia) 07/09/2015  ? Acute chest pain 03/20/2014  ? Type 2 diabetes mellitus ([place]) 03/20/2014  ? Hypercholesterolemia 03/20/2014  ? Essential hypertension 03/20/2014  ? ? ?Allergies  ?Allergen Reactions  ? Latex Rash  ? Penicillins Other (See Comments)  ? ? ?Past Surgical  History:  ?Procedure Laterality Date  ? CATARACT EXTRACTION Bilateral 2014  ? COLONOSCOPY  2011  ? cleared for 5 yrs- [facility]  ? ECTOPIC PREGNANCY SURGERY    ? ? ?Social History  ? ?Tobacco Use  ? Smoking status: Never  ?  Passive exposure: Never  ? Smokeless tobacco: Never  ? Tobacco comments:  ?  smoking cessation  materials not required  ?Vaping Use  ? Vaping Use: Never used  ?Substance Use Topics  ? Alcohol use: No  ?  Alcohol/week: 0.0 standard drinks  ? Drug use: No  ? ? ? ?Medication list has been reviewed and updated. ? ?Current Meds  ?Medication Sig  ? acetaminophen (TYLENOL) 325 MG tablet Take 650 mg by mouth every 6 (six) hours as needed.  ? acetaminophen (TYLENOL) 325 MG tablet Take 325 mg by mouth 2 (two) times daily. START 2 days prior to chemo infusion. Take For 2 days; Do NOT take on the day of infusion  ? Calcium Carbonate-Vit D-Min (CALCIUM 1200 PO) Take 1 capsule by mouth daily.  ? carvedilol (COREG) 3.125 MG tablet Take 1 tablet (3.125 mg total) by mouth 2 (two) times daily with a meal.  ? cholecalciferol (VITAMIN D3) 25 MCG (1000 UNIT) tablet Take 1,000 Units by mouth daily.  ? dexamethasone (DECADRON) 4 MG tablet Take 1 tablet (4 mg total) by mouth 2 (two) times daily. Start 2 days prior to infusion; Take it for 2 days.  ? Glucerna (GLUCERNA) LIQD Take 237 mLs by mouth.  ? hydrOXYzine (ATARAX) 10 MG tablet Take 1 tablet (10 mg total) by mouth at bedtime as needed.  ? lenalidomide (REVLIMID) 15 MG capsule Take 1 capsule (15 mg total) by mouth daily. Take for 21 days, then hold for 7 days. Repeat every 28 days.  ? losartan (COZAAR) 25 MG tablet TAKE ONE TABLET BY MOUTH EVERY EVENING  ? lovastatin (MEVACOR) 40 MG tablet Take 1 tablet (40 mg total) by mouth every evening.  ? metFORMIN (GLUCOPHAGE) 1000 MG tablet TAKE ONE TABLET BY MOUTH TWICE DAILY  ? montelukast (SINGULAIR) 10 MG tablet Take 1 tablet (10 mg total) by mouth daily. START 2 days prior to chemo infusion. Take For 2 days; Do NOT take on the day of infusion.  ? Multiple Vitamin (MULTIVITAMIN ADULT PO) Take by mouth.  ? Omega-3 Fatty Acids (FISH OIL) 1000 MG CAPS Take 1 capsule (1,000 mg total) by mouth daily.  ? ondansetron (ZOFRAN) 4 MG tablet Take 1 tablet (4 mg total) by mouth every 6 (six) hours as needed for nausea.  ? pantoprazole (PROTONIX)  40 MG tablet Take 1 tablet (40 mg total) by mouth daily.  ? sertraline (ZOLOFT) 25 MG tablet Take 1 tablet (25 mg total) by mouth daily.  ? [DISCONTINUED] aspirin 81 MG chewable tablet Chew 1 tablet (81 mg total) by mouth daily.  ? ? ?PHQ 2/9 Scores 05/17/2021 02/01/2021 12/12/2020 11/07/2020  ?PHQ - 2 Score 4 0 0 0  ?PHQ- 9 Score '8 2 4 ' -  ? ? ?GAD 7 : Generalized Anxiety Score 05/17/2021 02/01/2021 12/12/2020 07/24/2020  ?Nervous, Anxious, on Edge 0 0 0 1  ?Control/stop worrying 1 0 0 0  ?Worry too much - different things 1 0 0 0  ?Trouble relaxing 0 1 0 0  ?Restless 0 - 0 0  ?Easily annoyed or irritable 0 0 0 0  ?Afraid - awful might happen 0 0 0 0  ?Total GAD 7 Score 2 - 0 1  ?Anxiety  Difficulty Not difficult at all - - Not difficult at all  ? ? ?BP Readings from Last 3 Encounters:  ?06/03/21 130/62  ?05/30/21 (!) 184/74  ?05/17/21 122/64  ? ? ?Physical Exam ?Vitals and nursing note reviewed.  ?Constitutional:   ?   Appearance: She is well-developed.  ?HENT:  ?   Head: Normocephalic.  ?   Right Ear: External ear normal.  ?   Left Ear: External ear normal.  ?Eyes:  ?   General: Lids are everted, no foreign bodies appreciated. No scleral icterus.    ?   Left eye: No foreign body or hordeolum.  ?   Conjunctiva/sclera: Conjunctivae normal.  ?   Right eye: Right conjunctiva is not injected.  ?   Left eye: Left conjunctiva is not injected.  ?   Pupils: Pupils are equal, round, and reactive to light.  ?Neck:  ?   Thyroid: No thyromegaly.  ?   Vascular: No JVD.  ?   Trachea: No tracheal deviation.  ?Cardiovascular:  ?   Rate and Rhythm: Normal rate and regular rhythm.  ?   Heart sounds: Normal heart sounds. No murmur heard. ?  No friction rub. No gallop.  ?Pulmonary:  ?   Effort: Pulmonary effort is normal. No respiratory distress.  ?   Breath sounds: Normal breath sounds. No wheezing or rales.  ?Abdominal:  ?   General: Bowel sounds are normal.  ?   Palpations: Abdomen is soft. There is no mass.  ?   Tenderness: There is no  abdominal tenderness. There is no guarding or rebound.  ?Musculoskeletal:     ?   General: No tenderness. Normal range of motion.  ?   Cervical back: Neck supple.  ?Lymphadenopathy:  ?   Cervical: No cervi

## 2021-06-04 ENCOUNTER — Encounter: Payer: Self-pay | Admitting: Family Medicine

## 2021-06-05 DIAGNOSIS — M8448XD Pathological fracture, other site, subsequent encounter for fracture with routine healing: Secondary | ICD-10-CM | POA: Diagnosis not present

## 2021-06-05 DIAGNOSIS — E7800 Pure hypercholesterolemia, unspecified: Secondary | ICD-10-CM | POA: Diagnosis not present

## 2021-06-05 DIAGNOSIS — E119 Type 2 diabetes mellitus without complications: Secondary | ICD-10-CM | POA: Diagnosis not present

## 2021-06-05 DIAGNOSIS — E871 Hypo-osmolality and hyponatremia: Secondary | ICD-10-CM | POA: Diagnosis not present

## 2021-06-05 DIAGNOSIS — I10 Essential (primary) hypertension: Secondary | ICD-10-CM | POA: Diagnosis not present

## 2021-06-06 ENCOUNTER — Encounter: Payer: Self-pay | Admitting: Licensed Clinical Social Worker

## 2021-06-06 NOTE — Progress Notes (Unsigned)
[place] ?Clinical Social Work ? ?Clinical Social Work was referred by medical provider for assessment of psychosocial needs.  Clinical Social Worker contacted patient by phone  to offer support and assess for needs.  CSW left voicemail with contact information and request for return call. ? ? ?Layliana Devins, LCSW  ?Clinical Social Worker ?[place] ?      ?First attempt ?

## 2021-06-07 DIAGNOSIS — M8448XD Pathological fracture, other site, subsequent encounter for fracture with routine healing: Secondary | ICD-10-CM | POA: Diagnosis not present

## 2021-06-07 DIAGNOSIS — I10 Essential (primary) hypertension: Secondary | ICD-10-CM | POA: Diagnosis not present

## 2021-06-07 DIAGNOSIS — E871 Hypo-osmolality and hyponatremia: Secondary | ICD-10-CM | POA: Diagnosis not present

## 2021-06-07 DIAGNOSIS — E7800 Pure hypercholesterolemia, unspecified: Secondary | ICD-10-CM | POA: Diagnosis not present

## 2021-06-07 DIAGNOSIS — E119 Type 2 diabetes mellitus without complications: Secondary | ICD-10-CM | POA: Diagnosis not present

## 2021-06-08 DIAGNOSIS — Z791 Long term (current) use of non-steroidal anti-inflammatories (NSAID): Secondary | ICD-10-CM | POA: Diagnosis not present

## 2021-06-08 DIAGNOSIS — Z7982 Long term (current) use of aspirin: Secondary | ICD-10-CM | POA: Diagnosis not present

## 2021-06-08 DIAGNOSIS — Z9181 History of falling: Secondary | ICD-10-CM | POA: Diagnosis not present

## 2021-06-08 DIAGNOSIS — E871 Hypo-osmolality and hyponatremia: Secondary | ICD-10-CM | POA: Diagnosis not present

## 2021-06-08 DIAGNOSIS — I10 Essential (primary) hypertension: Secondary | ICD-10-CM | POA: Diagnosis not present

## 2021-06-08 DIAGNOSIS — E538 Deficiency of other specified B group vitamins: Secondary | ICD-10-CM | POA: Diagnosis not present

## 2021-06-08 DIAGNOSIS — Z7984 Long term (current) use of oral hypoglycemic drugs: Secondary | ICD-10-CM | POA: Diagnosis not present

## 2021-06-08 DIAGNOSIS — E119 Type 2 diabetes mellitus without complications: Secondary | ICD-10-CM | POA: Diagnosis not present

## 2021-06-08 DIAGNOSIS — R293 Abnormal posture: Secondary | ICD-10-CM | POA: Diagnosis not present

## 2021-06-08 DIAGNOSIS — M8448XD Pathological fracture, other site, subsequent encounter for fracture with routine healing: Secondary | ICD-10-CM | POA: Diagnosis not present

## 2021-06-08 DIAGNOSIS — E7800 Pure hypercholesterolemia, unspecified: Secondary | ICD-10-CM | POA: Diagnosis not present

## 2021-06-10 ENCOUNTER — Telehealth: Payer: Self-pay

## 2021-06-10 DIAGNOSIS — E7800 Pure hypercholesterolemia, unspecified: Secondary | ICD-10-CM | POA: Diagnosis not present

## 2021-06-10 DIAGNOSIS — E119 Type 2 diabetes mellitus without complications: Secondary | ICD-10-CM | POA: Diagnosis not present

## 2021-06-10 DIAGNOSIS — E871 Hypo-osmolality and hyponatremia: Secondary | ICD-10-CM | POA: Diagnosis not present

## 2021-06-10 DIAGNOSIS — M8448XD Pathological fracture, other site, subsequent encounter for fracture with routine healing: Secondary | ICD-10-CM | POA: Diagnosis not present

## 2021-06-10 DIAGNOSIS — I10 Essential (primary) hypertension: Secondary | ICD-10-CM | POA: Diagnosis not present

## 2021-06-10 NOTE — Telephone Encounter (Signed)
Copied from [place] [id number]. Topic: Quick Communication - Home Health Verbal Orders ?>> Jun 10, 2021  3:08 PM Pawlus, Brayton Layman A wrote: ?Caller/Agency: [facility]  ?Callback Number: [phone] ?Requesting: PT ?Frequency: 2x4 ?

## 2021-06-10 NOTE — Telephone Encounter (Signed)
Called pt left VM giving verbal orders. Cindys name was on VM. ? ?KP ?

## 2021-06-11 DIAGNOSIS — I10 Essential (primary) hypertension: Secondary | ICD-10-CM | POA: Diagnosis not present

## 2021-06-11 DIAGNOSIS — E7800 Pure hypercholesterolemia, unspecified: Secondary | ICD-10-CM | POA: Diagnosis not present

## 2021-06-11 DIAGNOSIS — E871 Hypo-osmolality and hyponatremia: Secondary | ICD-10-CM | POA: Diagnosis not present

## 2021-06-11 DIAGNOSIS — M8448XD Pathological fracture, other site, subsequent encounter for fracture with routine healing: Secondary | ICD-10-CM | POA: Diagnosis not present

## 2021-06-11 DIAGNOSIS — E119 Type 2 diabetes mellitus without complications: Secondary | ICD-10-CM | POA: Diagnosis not present

## 2021-06-11 DIAGNOSIS — Z20822 Contact with and (suspected) exposure to covid-19: Secondary | ICD-10-CM | POA: Diagnosis not present

## 2021-06-12 ENCOUNTER — Encounter: Payer: Self-pay | Admitting: Licensed Clinical Social Worker

## 2021-06-12 ENCOUNTER — Encounter: Payer: Self-pay | Admitting: Family Medicine

## 2021-06-12 NOTE — Progress Notes (Signed)
[facility] Clinical Social Work  ?Initial Assessment ? ? ?Cristina Morgan is a 87 y.o. year old female contacted by phone. Clinical Social Work was referred by medical provider for assessment of psychosocial needs.  ? ?SDOH (Social Determinants of Health) assessments performed: Yes ?SDOH Interventions   ? ?Flowsheet Row Most Recent Value  ?SDOH Interventions   ?Food Insecurity Interventions Intervention Not Indicated  ?Financial Strain Interventions Intervention Not Indicated  ?Housing Interventions Intervention Not Indicated  ?Intimate Partner Violence Interventions Intervention Not Indicated  ?Physical Activity Interventions Intervention Not Indicated  ?Stress Interventions Intervention Not Indicated  ?Social Connections Interventions Intervention Not Indicated  ?Transportation Interventions Intervention Not Indicated  ?Depression Interventions/Treatment  Counseling  ? ?  ?  ?Distress Screen completed: No ?   ? View : No data to display.  ?  ?  ?  ? ? ? ? ?Family/Social Information:  ?Housing Arrangement: patient lives with spouse and daughters Cristina Morgan [phone] and Cristina Morgan [phone] primary caregivers. ?Family members/support persons in your life? Family, Social research officer, government, and Community  ?Transportation concerns: no  ?Employment: Retired. Income source: [place] ?Financial concerns: No ?Type of concern: None ?Food access concerns: no ?Religious or spiritual practice: yes ?Services Currently in place:  Medicare ? ?Coping/ Adjustment to diagnosis: ?Patient understands treatment plan and what happens next? yes ?Concerns about diagnosis and/or treatment: I'm not especially worried about anything ?Patient reported stressors: Depression, Anxiety, and Adjusting to my illness ?Hopes and priorities:   ?Patient enjoys time with family/ friends ?Current coping skills/ strengths: Motivation for treatment/growth  and Supportive family/friends  ? ? ? SUMMARY: ?Current SDOH Barriers:   ?Financial constraints related to fixed income and patient needs ramp but cannot afford to pay privately. ? ?Clinical Social Work Clinical Goal(s):  ?patient will work with SW to address concerns related to anxiety ? ?Interventions: ?Discussed common feeling and emotions when being diagnosed with cancer, and the importance of support during treatment ?Informed patient of the support team roles and support services at [facility] ?Provided CSW contact information and encouraged patient to call with any questions or concerns ?Provided patient with information about CSW role in patient care and available resources. ? ? ?Follow Up Plan: Patient will contact CSW with any support or resource needs, CSW will follow-up with patient by phone , and CSW will email patient's daughter Cristina Morgan to [email or web address]. ?Patient verbalizes understanding of plan: Yes ? ? ? ?Cristina Dibuono, LCSW ?

## 2021-06-13 ENCOUNTER — Encounter: Payer: Self-pay | Admitting: Internal Medicine

## 2021-06-13 ENCOUNTER — Inpatient Hospital Stay: Payer: Medicare Other

## 2021-06-13 ENCOUNTER — Encounter: Payer: Self-pay | Admitting: Nurse Practitioner

## 2021-06-13 ENCOUNTER — Inpatient Hospital Stay (HOSPITAL_BASED_OUTPATIENT_CLINIC_OR_DEPARTMENT_OTHER): Payer: Medicare Other | Admitting: Nurse Practitioner

## 2021-06-13 VITALS — BP 130/69 | HR 81 | Temp 99.0°F | Ht 66.0 in | Wt 133.1 lb

## 2021-06-13 DIAGNOSIS — Z7984 Long term (current) use of oral hypoglycemic drugs: Secondary | ICD-10-CM | POA: Diagnosis not present

## 2021-06-13 DIAGNOSIS — E119 Type 2 diabetes mellitus without complications: Secondary | ICD-10-CM | POA: Diagnosis not present

## 2021-06-13 DIAGNOSIS — C9000 Multiple myeloma not having achieved remission: Secondary | ICD-10-CM

## 2021-06-13 DIAGNOSIS — Z794 Long term (current) use of insulin: Secondary | ICD-10-CM | POA: Diagnosis not present

## 2021-06-13 DIAGNOSIS — Z833 Family history of diabetes mellitus: Secondary | ICD-10-CM | POA: Diagnosis not present

## 2021-06-13 DIAGNOSIS — E1165 Type 2 diabetes mellitus with hyperglycemia: Secondary | ICD-10-CM | POA: Diagnosis not present

## 2021-06-13 DIAGNOSIS — E876 Hypokalemia: Secondary | ICD-10-CM | POA: Diagnosis not present

## 2021-06-13 LAB — CBC WITH DIFFERENTIAL/PLATELET
Abs Immature Granulocytes: 0.06 10*3/uL (ref 0.00–0.07)
Basophils Absolute: 0 10*3/uL (ref 0.0–0.1)
Basophils Relative: 0 %
Eosinophils Absolute: 0.1 10*3/uL (ref 0.0–0.5)
Eosinophils Relative: 1 %
HCT: 27.6 % — ABNORMAL LOW (ref 36.0–46.0)
Hemoglobin: 8.9 g/dL — ABNORMAL LOW (ref 12.0–15.0)
Immature Granulocytes: 1 %
Lymphocytes Relative: 24 %
Lymphs Abs: 2.3 10*3/uL (ref 0.7–4.0)
MCH: 31 pg (ref 26.0–34.0)
MCHC: 32.2 g/dL (ref 30.0–36.0)
MCV: 96.2 fL (ref 80.0–100.0)
Monocytes Absolute: 0.9 10*3/uL (ref 0.1–1.0)
Monocytes Relative: 9 %
Neutro Abs: 6.2 10*3/uL (ref 1.7–7.7)
Neutrophils Relative %: 65 %
Platelets: 284 10*3/uL (ref 150–400)
RBC: 2.87 MIL/uL — ABNORMAL LOW (ref 3.87–5.11)
RDW: 15.5 % (ref 11.5–15.5)
WBC: 9.5 10*3/uL (ref 4.0–10.5)
nRBC: 0 % (ref 0.0–0.2)

## 2021-06-13 LAB — COMPREHENSIVE METABOLIC PANEL
ALT: 13 U/L (ref 0–44)
AST: 18 U/L (ref 15–41)
Albumin: 3.4 g/dL — ABNORMAL LOW (ref 3.5–5.0)
Alkaline Phosphatase: 88 U/L (ref 38–126)
Anion gap: 10 (ref 5–15)
BUN: 23 mg/dL (ref 8–23)
CO2: 25 mmol/L (ref 22–32)
Calcium: 8.8 mg/dL — ABNORMAL LOW (ref 8.9–10.3)
Chloride: 99 mmol/L (ref 98–111)
Creatinine, Ser: 0.99 mg/dL (ref 0.44–1.00)
GFR, Estimated: 56 mL/min — ABNORMAL LOW (ref >60)
Glucose, Bld: 212 mg/dL — ABNORMAL HIGH (ref 70–99)
Potassium: 2.8 mmol/L — ABNORMAL LOW (ref 3.5–5.1)
Sodium: 134 mmol/L — ABNORMAL LOW (ref 135–145)
Total Bilirubin: 1 mg/dL (ref 0.3–1.2)
Total Protein: 7.3 g/dL (ref 6.5–8.1)

## 2021-06-13 NOTE — Progress Notes (Signed)
[place] ?CONSULT NOTE ? ?Patient Care Team: ?Juline Patch, MD as PCP - General (Family Medicine) ?Vladimir Faster, [place] (Inactive) (Pharmacist) ?Cammie Sickle, MD as Consulting Physician (Oncology) ? ?CHIEF COMPLAINTS/PURPOSE OF CONSULTATION: Multiple myeloma ? ?Oncology History Overview Note  ?# MULTIPLE MYELOMA  [FEB 2023-hypercalcemia status changes]-Active [bone lesions; hypercalcemia; anemia; No renal insuffiencey;Bone marrow-32% plasma cell-   STANDARD Cytogenetics].FEB 2023- S/p dexamethasone 20 mg q day x4. ? ? ?# FEB 2023-hypercalcemia [[facility]-status post calcitonin; bisphosphonate] ? ?BONE MARROW, ASPIRATE, CLOT, CORE:  ?-Hypercellular bone marrow with plasma cell neoplasm  ?-See comment  ? ?PERIPHERAL BLOOD:  ?-Normocytic-normochromic anemia  ? ?COMMENT:  ? ?The bone marrow is hypercellular for age with increased number of  ?atypical plasma cells representing 32% of all cells in the aspirate  ?associated with interstitial infiltrates and numerous variably sized  ?clusters in the clot/biopsy sections.  The plasma cells display weak  ?kappa light chain restriction consistent with plasma cell neoplasm.  ?Correlation with cytogenetic and FISH studies is recommended  ? ?MICROSCOPIC DESCRIPTION:  ? ?PERIPHERAL BLOOD SMEAR: The red blood cells display mild  ?anisopoikilocytosis with mild polychromasia.  The white blood cells are  ?normal number with scattered hypogranular neutrophils. An occasional  ?myelocyte and large atypical mononuclear cells are seen on scan. The  ?platelets are normal in number.  ?  ?Multiple myeloma not having achieved remission ([place])  ?05/15/2021 Initial Diagnosis  ? Multiple myeloma not having achieved remission ([place]) ?  ?05/30/2021 -  Chemotherapy  ? Patient is on Treatment Plan : MYELOMA Daratumumab SQ + Lenalidomide + Dexamethasone (DaraRd) q28d  ?   ? ? ?HISTORY OF PRESENTING ILLNESS: pt in a wheelchair; accompanied by her daughter.  ? ?Cristina Morgan 87 y.o.  female with multiple compression fractures in the back, history of diabetes hypertension-newly diagnosed multiple myeloma is here for consideration of zometa. She has been receiving insulin per pcp. She has ongoing fatigue. Reports blood sugars have been better controlled.  ? ?Review of Systems  ?Constitutional:  Positive for malaise/fatigue. Negative for chills, diaphoresis, fever and weight loss.  ?HENT:  Negative for nosebleeds and sore throat.   ?Eyes:  Negative for double vision.  ?Respiratory:  Negative for cough, hemoptysis, sputum production, shortness of breath and wheezing.   ?Cardiovascular:  Negative for chest pain, palpitations, orthopnea and leg swelling.  ?Gastrointestinal:  Negative for abdominal pain, blood in stool, constipation, diarrhea, heartburn, melena, nausea and vomiting.  ?Genitourinary:  Negative for dysuria, frequency and urgency.  ?Musculoskeletal:  Positive for back pain and joint pain.  ?Skin: Negative.  Negative for itching and rash.  ?Neurological:  Negative for dizziness, tingling, focal weakness, weakness and headaches.  ?Endo/Heme/Allergies:  Does not bruise/bleed easily.  ?Psychiatric/Behavioral:  Negative for depression. The patient is not nervous/anxious and does not have insomnia.    ? ?MEDICAL HISTORY:  ?Past Medical History:  ?Diagnosis Date  ? Anemia   ? CAD (coronary artery disease)   ? Cataracts, bilateral   ? Closed compression fracture of first lumbar vertebra ([place])   ? Closed compression fracture of second lumbar vertebra ([place])   ? Diabetes mellitus without complication ([place])   ? High cholesterol   ? History of pneumonia 2010  ? Legionnaires  ? History of uterine fibroid   ? Hypercalcemia   ? Hyperlipidemia   ? Hypertension   ? Lytic bone lesions on xray   ? Multiple myeloma ([place])   ? Osteoporosis   ? Primary osteoarthritis of  right knee   ? ? ?SURGICAL HISTORY: ?Past Surgical History:  ?Procedure Laterality Date  ? CATARACT EXTRACTION Bilateral 2014  ? COLONOSCOPY   2011  ? cleared for 5 yrs- [facility]  ? ECTOPIC PREGNANCY SURGERY    ? ? ?SOCIAL HISTORY: ?Social History  ? ?Socioeconomic History  ? Marital status: Married  ?  Spouse name: Not on file  ? Number of children: 2  ? Years of education: Not on file  ? Highest education level: 12th grade  ?Occupational History  ? Occupation: Retired  ?Tobacco Use  ? Smoking status: Never  ?  Passive exposure: Never  ? Smokeless tobacco: Never  ? Tobacco comments:  ?  smoking cessation materials not required  ?Vaping Use  ? Vaping Use: Never used  ?Substance and Sexual Activity  ? Alcohol use: No  ? Drug use: No  ? Sexual activity: Not Currently  ?Other Topics Concern  ? Not on file  ?Social History Narrative  ? 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 Never smoked; no alcohol. Daughters live close.   ? ?Social Determinants of Health  ? ?Financial Resource Strain: Low Risk   ? Difficulty of Paying Living Expenses: Not very hard  ?Food Insecurity: No Food Insecurity  ? Worried About Charity fundraiser in the Last Year: Never true  ? Ran Out of Food in the Last Year: Never true  ?Transportation Needs: No Transportation Needs  ? Lack of Transportation (Medical): No  ? Lack of Transportation (Non-Medical): No  ?Physical Activity: Inactive  ? Days of Exercise per Week: 0 days  ? Minutes of Exercise per Session: 0 min  ?Stress: No Stress Concern Present  ? Feeling of Stress : Only a little  ?Social Connections: Moderately Integrated  ? Frequency of Communication with Friends and Family: Three times a week  ? Frequency of Social Gatherings with Friends and Family: Three times a week  ? Attends Religious Services: 1 to 4 times per year  ? Active Member of Clubs or Organizations: No  ? Attends Archivist Meetings: Never  ? Marital Status: Married  ?Intimate Partner Violence: Not At Risk  ? Fear of Current or Ex-Partner: No  ? Emotionally Abused: No  ? Physically Abused: No  ? Sexually Abused: No  ? ? ?FAMILY HISTORY: ?Family History  ?Problem  Relation Age of Onset  ? Cancer Mother   ?     breast  ? Heart disease Mother   ? Diabetes Father   ? Heart disease Father   ? Heart attack Father   ? Heart attack Brother   ? Diabetes Brother   ? ? ?ALLERGIES:  is allergic to latex and penicillins. ? ?MEDICATIONS:  ?Current Outpatient Medications  ?Medication Sig Dispense Refill  ? acetaminophen (TYLENOL) 325 MG tablet Take 650 mg by mouth every 6 (six) hours as needed.    ? acetaminophen (TYLENOL) 325 MG tablet Take 325 mg by mouth 2 (two) times daily. START 2 days prior to chemo infusion. Take For 2 days; Do NOT take on the day of infusion    ? acyclovir (ZOVIRAX) 400 MG tablet Take 1 tablet (400 mg total) by mouth 2 (two) times daily. 60 tablet 4  ? baclofen (LIORESAL) 20 MG tablet TAKE ONE TABLET BY MOUTH three times daily (Patient not taking: Reported on 06/03/2021) 30 tablet 0  ? Calcium Carbonate-Vit D-Min (CALCIUM 1200 PO) Take 1 capsule by mouth daily.    ? carvedilol (COREG) 3.125 MG tablet Take 1  tablet (3.125 mg total) by mouth 2 (two) times daily with a meal. 60 tablet 1  ? cholecalciferol (VITAMIN D3) 25 MCG (1000 UNIT) tablet Take 1,000 Units by mouth daily.    ? dexamethasone (DECADRON) 4 MG tablet Take 1 tablet (4 mg total) by mouth 2 (two) times daily. Start 2 days prior to infusion; Take it for 2 days. 60 tablet 3  ? Glucerna (GLUCERNA) LIQD Take 237 mLs by mouth.    ? hydrOXYzine (ATARAX) 10 MG tablet Take 1 tablet (10 mg total) by mouth at bedtime as needed. 90 tablet 1  ? insulin glargine (LANTUS SOLOSTAR) 100 UNIT/ML Solostar Pen Inject 5 Units into the skin daily. 15 mL PRN  ? lenalidomide (REVLIMID) 15 MG capsule Take 1 capsule (15 mg total) by mouth daily. Take for 21 days, then hold for 7 days. Repeat every 28 days. 21 capsule 0  ? losartan (COZAAR) 25 MG tablet TAKE ONE TABLET BY MOUTH EVERY EVENING 90 tablet 0  ? lovastatin (MEVACOR) 40 MG tablet Take 1 tablet (40 mg total) by mouth every evening. 90 tablet 1  ? metFORMIN (GLUCOPHAGE)  1000 MG tablet TAKE ONE TABLET BY MOUTH TWICE DAILY 180 tablet 0  ? montelukast (SINGULAIR) 10 MG tablet Take 1 tablet (10 mg total) by mouth daily. START 2 days prior to chemo infusion. Take For 2 days

## 2021-06-14 ENCOUNTER — Telehealth: Payer: Self-pay | Admitting: *Deleted

## 2021-06-14 DIAGNOSIS — E871 Hypo-osmolality and hyponatremia: Secondary | ICD-10-CM | POA: Diagnosis not present

## 2021-06-14 DIAGNOSIS — E7800 Pure hypercholesterolemia, unspecified: Secondary | ICD-10-CM | POA: Diagnosis not present

## 2021-06-14 DIAGNOSIS — E119 Type 2 diabetes mellitus without complications: Secondary | ICD-10-CM | POA: Diagnosis not present

## 2021-06-14 DIAGNOSIS — M8448XD Pathological fracture, other site, subsequent encounter for fracture with routine healing: Secondary | ICD-10-CM | POA: Diagnosis not present

## 2021-06-14 DIAGNOSIS — I10 Essential (primary) hypertension: Secondary | ICD-10-CM | POA: Diagnosis not present

## 2021-06-14 NOTE — Telephone Encounter (Signed)
Today, RN received FMLAs in mail from daughters, Cristina Morgan and Cristina Morgan.  I also received a copy of Cristina Morgan's fmla via fax.  I spoke with [place] and [place] regarding their fmla dates. Both daughters preferred to have continuous leave between 05/01/21 and 05/07/21 to cover the time off during patient's hospitalization. They both require intermittent leave up to 4 times a month (8 hours/each episode) for pt's treatment apts. Forms completed and signed by Ander Purpura, NP on behalf of Dr. Rogue Bussing ? ?Cristina Morgan requested that she and her sister's fmla be emailed securely directly to her at [email or web address] ? ?[place], requested that I send a copy of her fmla to her sister via her sister's email- [email or web address] ? ?Cristina Morgan confirmed receipt of secure email ? ?[name] wanted her form to also be faxed to [facility] (fax # [phone]). This was done- fax confirmation rcvd. ? ?Family will pick up original copy at pt's next apt (06/27/21) at front reception desk.  ? ?Copy of forms sent to HIM to be scanned into pt's chart. ? ? ?

## 2021-06-15 LAB — SAMPLE TO BLOOD BANK

## 2021-06-17 ENCOUNTER — Other Ambulatory Visit: Payer: Self-pay | Admitting: Family Medicine

## 2021-06-17 ENCOUNTER — Encounter: Payer: Self-pay | Admitting: Licensed Clinical Social Worker

## 2021-06-17 DIAGNOSIS — M545 Low back pain, unspecified: Secondary | ICD-10-CM

## 2021-06-17 DIAGNOSIS — S32000D Wedge compression fracture of unspecified lumbar vertebra, subsequent encounter for fracture with routine healing: Secondary | ICD-10-CM

## 2021-06-17 NOTE — Progress Notes (Signed)
[place] CSW Progress Note ? ?Clinical Social Worker emailed patient's daughter information about charity ramp agencies. ? ? ? ?Jamesetta Greenhalgh , LCSW ?

## 2021-06-18 DIAGNOSIS — E871 Hypo-osmolality and hyponatremia: Secondary | ICD-10-CM | POA: Diagnosis not present

## 2021-06-18 DIAGNOSIS — Z20822 Contact with and (suspected) exposure to covid-19: Secondary | ICD-10-CM | POA: Diagnosis not present

## 2021-06-18 DIAGNOSIS — E119 Type 2 diabetes mellitus without complications: Secondary | ICD-10-CM | POA: Diagnosis not present

## 2021-06-18 DIAGNOSIS — M8448XD Pathological fracture, other site, subsequent encounter for fracture with routine healing: Secondary | ICD-10-CM | POA: Diagnosis not present

## 2021-06-18 DIAGNOSIS — E7800 Pure hypercholesterolemia, unspecified: Secondary | ICD-10-CM | POA: Diagnosis not present

## 2021-06-18 DIAGNOSIS — I10 Essential (primary) hypertension: Secondary | ICD-10-CM | POA: Diagnosis not present

## 2021-06-19 ENCOUNTER — Ambulatory Visit: Payer: Self-pay | Admitting: *Deleted

## 2021-06-19 NOTE — Telephone Encounter (Signed)
?  Chief Complaint: frequent urination ?Symptoms: up several times at night ?Frequency: frequent after drinking a lot of cranberry juice and water ?Pertinent Negatives: Patient denies fever, pain, odor ?Disposition: '[]'$ ED /'[]'$ Urgent Care (no appt availability in office) / '[]'$ Appointment(In office/virtual)/ '[]'$  [facility] [facility]/ '[]'$ Home Care/ '[]'$ Refused Recommended Disposition /'[]'$ [place] [facility]/ '[x]'$  Follow-up with PCP ?Additional Notes: Pt is going to ask her daughter about transportation before scheduling appt. She does have [place] coming Friday. Pt states she drank a lot of cranberry juice and water and now  she is urinating frequently.  ? ?Reason for Disposition ? Has to get out of bed to urinate > 2 times a night (i.e., nocturia) ? ?Answer Assessment - Initial Assessment Questions ?1. SYMPTOM: "What's the main symptom you're concerned about?" (e.g., frequency, incontinence) ?    frequent ?2. ONSET: "When did the  frequency  start?" ?    Last night ?3. PAIN: "Is there any pain?" If Yes, ask: "How bad is it?" (Scale: 1-10; mild, moderate, severe) ?    no ?4. CAUSE: "What do you think is causing the symptoms?" ?    Was drinking a lot of cranberry juice and water ?5. OTHER SYMPTOMS: "Do you have any other symptoms?" (e.g., fever, flank pain, blood in urine, pain with urination) ?    no ?6. PREGNANCY: "Is there any chance you are pregnant?" "When was your last menstrual period?" ?    Na ? ?Protocols used: Urinary Symptoms-A-AH ? ?

## 2021-06-21 DIAGNOSIS — E871 Hypo-osmolality and hyponatremia: Secondary | ICD-10-CM | POA: Diagnosis not present

## 2021-06-21 DIAGNOSIS — I10 Essential (primary) hypertension: Secondary | ICD-10-CM | POA: Diagnosis not present

## 2021-06-21 DIAGNOSIS — M8448XD Pathological fracture, other site, subsequent encounter for fracture with routine healing: Secondary | ICD-10-CM | POA: Diagnosis not present

## 2021-06-21 DIAGNOSIS — E7800 Pure hypercholesterolemia, unspecified: Secondary | ICD-10-CM | POA: Diagnosis not present

## 2021-06-21 DIAGNOSIS — E119 Type 2 diabetes mellitus without complications: Secondary | ICD-10-CM | POA: Diagnosis not present

## 2021-06-24 ENCOUNTER — Encounter: Payer: Self-pay | Admitting: Internal Medicine

## 2021-06-24 DIAGNOSIS — I10 Essential (primary) hypertension: Secondary | ICD-10-CM | POA: Diagnosis not present

## 2021-06-24 DIAGNOSIS — E7800 Pure hypercholesterolemia, unspecified: Secondary | ICD-10-CM | POA: Diagnosis not present

## 2021-06-24 DIAGNOSIS — M8448XD Pathological fracture, other site, subsequent encounter for fracture with routine healing: Secondary | ICD-10-CM | POA: Diagnosis not present

## 2021-06-24 DIAGNOSIS — E871 Hypo-osmolality and hyponatremia: Secondary | ICD-10-CM | POA: Diagnosis not present

## 2021-06-24 DIAGNOSIS — E119 Type 2 diabetes mellitus without complications: Secondary | ICD-10-CM | POA: Diagnosis not present

## 2021-06-24 MED ORDER — POTASSIUM CHLORIDE CRYS ER 20 MEQ PO TBCR: 20.0000 meq | EXTENDED_RELEASE_TABLET | Freq: Two times a day (BID) | ORAL | 0 refills | Status: DC

## 2021-06-25 ENCOUNTER — Other Ambulatory Visit: Payer: Self-pay | Admitting: *Deleted

## 2021-06-25 DIAGNOSIS — C9000 Multiple myeloma not having achieved remission: Secondary | ICD-10-CM

## 2021-06-25 MED ORDER — LENALIDOMIDE 15 MG PO CAPS: 15.0000 mg | ORAL_CAPSULE | Freq: Every day | ORAL | 0 refills | Status: DC

## 2021-06-25 NOTE — Telephone Encounter (Signed)
Fanny Dance # [id number]   Date Obtained: 06/25/2021-aj ?

## 2021-06-26 DIAGNOSIS — E871 Hypo-osmolality and hyponatremia: Secondary | ICD-10-CM | POA: Diagnosis not present

## 2021-06-26 DIAGNOSIS — I10 Essential (primary) hypertension: Secondary | ICD-10-CM | POA: Diagnosis not present

## 2021-06-26 DIAGNOSIS — M8448XD Pathological fracture, other site, subsequent encounter for fracture with routine healing: Secondary | ICD-10-CM | POA: Diagnosis not present

## 2021-06-26 DIAGNOSIS — E7800 Pure hypercholesterolemia, unspecified: Secondary | ICD-10-CM | POA: Diagnosis not present

## 2021-06-26 DIAGNOSIS — E119 Type 2 diabetes mellitus without complications: Secondary | ICD-10-CM | POA: Diagnosis not present

## 2021-06-27 ENCOUNTER — Inpatient Hospital Stay: Payer: Medicare Other | Attending: Nurse Practitioner

## 2021-06-27 ENCOUNTER — Inpatient Hospital Stay: Payer: Medicare Other | Admitting: Pharmacist

## 2021-06-27 ENCOUNTER — Inpatient Hospital Stay: Payer: Medicare Other

## 2021-06-27 ENCOUNTER — Encounter: Payer: Self-pay | Admitting: Internal Medicine

## 2021-06-27 ENCOUNTER — Inpatient Hospital Stay (HOSPITAL_BASED_OUTPATIENT_CLINIC_OR_DEPARTMENT_OTHER): Payer: Medicare Other | Admitting: Internal Medicine

## 2021-06-27 VITALS — BP 165/75 | HR 62 | Resp 18

## 2021-06-27 DIAGNOSIS — C9000 Multiple myeloma not having achieved remission: Secondary | ICD-10-CM

## 2021-06-27 DIAGNOSIS — Z5112 Encounter for antineoplastic immunotherapy: Secondary | ICD-10-CM | POA: Insufficient documentation

## 2021-06-27 DIAGNOSIS — I10 Essential (primary) hypertension: Secondary | ICD-10-CM | POA: Insufficient documentation

## 2021-06-27 DIAGNOSIS — R630 Anorexia: Secondary | ICD-10-CM | POA: Diagnosis not present

## 2021-06-27 DIAGNOSIS — E1136 Type 2 diabetes mellitus with diabetic cataract: Secondary | ICD-10-CM | POA: Diagnosis not present

## 2021-06-27 DIAGNOSIS — D649 Anemia, unspecified: Secondary | ICD-10-CM | POA: Insufficient documentation

## 2021-06-27 LAB — CBC WITH DIFFERENTIAL/PLATELET
Abs Immature Granulocytes: 0.02 10*3/uL (ref 0.00–0.07)
Basophils Absolute: 0.1 10*3/uL (ref 0.0–0.1)
Basophils Relative: 2 %
Eosinophils Absolute: 0 10*3/uL (ref 0.0–0.5)
Eosinophils Relative: 1 %
HCT: 28.5 % — ABNORMAL LOW (ref 36.0–46.0)
Hemoglobin: 9.1 g/dL — ABNORMAL LOW (ref 12.0–15.0)
Immature Granulocytes: 0 %
Lymphocytes Relative: 38 %
Lymphs Abs: 2.4 10*3/uL (ref 0.7–4.0)
MCH: 30.8 pg (ref 26.0–34.0)
MCHC: 31.9 g/dL (ref 30.0–36.0)
MCV: 96.6 fL (ref 80.0–100.0)
Monocytes Absolute: 0.8 10*3/uL (ref 0.1–1.0)
Monocytes Relative: 13 %
Neutro Abs: 2.9 10*3/uL (ref 1.7–7.7)
Neutrophils Relative %: 46 %
Platelets: 509 10*3/uL — ABNORMAL HIGH (ref 150–400)
RBC: 2.95 MIL/uL — ABNORMAL LOW (ref 3.87–5.11)
RDW: 16 % — ABNORMAL HIGH (ref 11.5–15.5)
WBC: 6.3 10*3/uL (ref 4.0–10.5)
nRBC: 0 % (ref 0.0–0.2)

## 2021-06-27 LAB — COMPREHENSIVE METABOLIC PANEL
ALT: 16 U/L (ref 0–44)
AST: 27 U/L (ref 15–41)
Albumin: 3.5 g/dL (ref 3.5–5.0)
Alkaline Phosphatase: 119 U/L (ref 38–126)
Anion gap: 9 (ref 5–15)
BUN: 21 mg/dL (ref 8–23)
CO2: 24 mmol/L (ref 22–32)
Calcium: 9.4 mg/dL (ref 8.9–10.3)
Chloride: 102 mmol/L (ref 98–111)
Creatinine, Ser: 0.88 mg/dL (ref 0.44–1.00)
GFR, Estimated: >60 mL/min (ref >60)
Glucose, Bld: 198 mg/dL — ABNORMAL HIGH (ref 70–99)
Potassium: 3.5 mmol/L (ref 3.5–5.1)
Sodium: 135 mmol/L (ref 135–145)
Total Bilirubin: 0.4 mg/dL (ref 0.3–1.2)
Total Protein: 7 g/dL (ref 6.5–8.1)

## 2021-06-27 LAB — TYPE AND SCREEN
ABO/RH(D): O NEG
Antibody Screen: NEGATIVE

## 2021-06-27 MED ORDER — MONTELUKAST SODIUM 10 MG PO TABS
10.0000 mg | ORAL_TABLET | Freq: Once | ORAL | Status: AC
  Administered 2021-06-27: 10 mg via ORAL
  Filled 2021-06-27: qty 1

## 2021-06-27 MED ORDER — SODIUM CHLORIDE 0.9 % IV SOLN
Freq: Once | INTRAVENOUS | Status: AC
  Filled 2021-06-27: qty 250

## 2021-06-27 MED ORDER — LENALIDOMIDE 15 MG PO CAPS: 15.0000 mg | ORAL_CAPSULE | Freq: Every day | ORAL | 0 refills | Status: DC

## 2021-06-27 MED ORDER — ZOLEDRONIC ACID 4 MG/5ML IV CONC
3.0000 mg | Freq: Once | INTRAVENOUS | Status: AC
  Administered 2021-06-27: 3 mg via INTRAVENOUS
  Filled 2021-06-27: qty 3.75

## 2021-06-27 MED ORDER — DIPHENHYDRAMINE HCL 25 MG PO CAPS
50.0000 mg | ORAL_CAPSULE | Freq: Once | ORAL | Status: AC
  Administered 2021-06-27: 50 mg via ORAL
  Filled 2021-06-27: qty 2

## 2021-06-27 MED ORDER — ACETAMINOPHEN 325 MG PO TABS
650.0000 mg | ORAL_TABLET | Freq: Once | ORAL | Status: AC
  Administered 2021-06-27: 650 mg via ORAL
  Filled 2021-06-27: qty 2

## 2021-06-27 MED ORDER — DARATUMUMAB-HYALURONIDASE-FIHJ 1800-30000 MG-UT/15ML ~~LOC~~ SOLN
1800.0000 mg | Freq: Once | SUBCUTANEOUS | Status: AC
  Administered 2021-06-27: 1800 mg via SUBCUTANEOUS
  Filled 2021-06-27: qty 15

## 2021-06-27 MED ORDER — DEXAMETHASONE 4 MG PO TABS
20.0000 mg | ORAL_TABLET | Freq: Once | ORAL | Status: AC
  Administered 2021-06-27: 20 mg via ORAL
  Filled 2021-06-27: qty 5

## 2021-06-27 NOTE — Assessment & Plan Note (Addendum)
#  MULTIPLE MYELOMA -Active [bone lesions; hypercalcemia; anemia; No renal insuffiencey;Bone marrow-32% plasma cell-   STANDARD Cytogenetics].  Currently on Revlimid-Dex.  ? ?#Proceed with Dara SQ today; Labs today reviewed;  acceptable for treatment today.  ? ?#Diabetes-on oral hypoglycemic agents; BG-198; on longacting insulin. discussed with Dr.Jones.  Continue close monitoring. ? ?#Hypercalcemia secondary to multiple myeloma- [FEB 2023-Zometa; calcitonin]-calcium today is 9.26  Monitor closely.  Zometa 3 mg possibly in 2 weeks.  ? ?#DVT/shingles prophylaxis: Aspirin/acyclovir.  ? ?# IV access:  ? [name]- [phone] ? ?# DISPOSITION:  ?# proceed with  dara SQ today; Zometa today ? ?# follow up in 1 weeks- NP: labs- HOLD tube; cbc/cmp; Dara SQ ? ?# follow up in 2 weeks- MD; labs- cbc/cmp; Dara SQ-Dr.B ? ? ? ?

## 2021-06-27 NOTE — Progress Notes (Signed)
I connected with Cristina Morgan on 06/27/21  at  8:45 AM EDT by video enabled telemedicine visit and verified that I am speaking with the correct person using two identifiers.  ?I discussed the limitations, risks, security and privacy concerns of performing an evaluation and management service by telemedicine and the availability of in-person appointments. I also discussed with the patient that there may be a patient responsible charge related to this service. The patient expressed understanding and agreed to proceed.  ? ? ?Other persons participating in the visit and their role in the encounter: RN/medical reconciliation ?Patient?s location: home ?Provider?s location: office ? ?Oncology History Overview Note  ?# MULTIPLE MYELOMA  [FEB 2023-hypercalcemia status changes]-Active [bone lesions; hypercalcemia; anemia; No renal insuffiencey;Bone marrow-32% plasma cell-   STANDARD Cytogenetics].FEB 2023- S/p dexamethasone 20 mg q day x4. ? ? ?# FEB 2023-hypercalcemia [[facility]-status post calcitonin; bisphosphonate] ? ?BONE MARROW, ASPIRATE, CLOT, CORE:  ?-Hypercellular bone marrow with plasma cell neoplasm  ?-See comment  ? ?PERIPHERAL BLOOD:  ?-Normocytic-normochromic anemia  ? ?COMMENT:  ? ?The bone marrow is hypercellular for age with increased number of  ?atypical plasma cells representing 32% of all cells in the aspirate  ?associated with interstitial infiltrates and numerous variably sized  ?clusters in the clot/biopsy sections.  The plasma cells display weak  ?kappa light chain restriction consistent with plasma cell neoplasm.  ?Correlation with cytogenetic and FISH studies is recommended  ? ?MICROSCOPIC DESCRIPTION:  ? ?PERIPHERAL BLOOD SMEAR: The red blood cells display mild  ?anisopoikilocytosis with mild polychromasia.  The white blood cells are  ?normal number with scattered hypogranular neutrophils. An occasional  ?myelocyte and large atypical mononuclear cells are seen on scan. The  ?platelets are normal in  number.  ?  ?Multiple myeloma not having achieved remission (Cristina Morgan)  ?05/15/2021 Initial Diagnosis  ? Multiple myeloma not having achieved remission (Cristina Morgan) ?  ?06/27/2021 -  Chemotherapy  ? Patient is on Treatment Plan : MYELOMA Daratumumab SQ + Lenalidomide + Dexamethasone (DaraRd) q28d  ? ?  ?  ? ? ? ?Chief Complaint: Multiple myeloma ? ? ?History of present illness:Cristina Morgan 87 y.o.  female with history of with multiple compression fractures in the back, history of diabetes hypertension-newly diagnosed multiple myeloma is here for follow-up/ proceed with Dara SQ today. Patient is currently taking Revlimid dexamethasone.   ? ?In the interim patient has been evaluated by primary care physician.  She is currently on long-acting insulin at nighttime.  States her blood sugars are better controlled.  ?  ?Patient states her back pain is improved.  Denies any worsening joint pains.  No falls.  No further episodes of mental status changes.  ?  ?Patient is currently at home.  Patient continues to be fatigued.   ? ?Observation/objective: Alert & oriented x 3. In No acute distress.  ? ?Assessment and plan: ?Multiple myeloma not having achieved remission (Cristina Morgan) ?# MULTIPLE MYELOMA -Active [bone lesions; hypercalcemia; anemia; No renal insuffiencey;Bone marrow-32% plasma cell-   STANDARD Cytogenetics].  Currently on Revlimid-Dex.  ? ?#Proceed with Dara SQ today; Labs today reviewed;  acceptable for treatment today.  ? ?#Diabetes-on oral hypoglycemic agents; BG-198; on longacting insulin. discussed with Dr.Jones.  Continue close monitoring. ? ?#Hypercalcemia secondary to multiple myeloma- [FEB 2023-Zometa; calcitonin]-calcium today is 9.26  Monitor closely.  Zometa 3 mg possibly in 2 weeks.  ? ?#DVT/shingles prophylaxis: Aspirin/acyclovir.  ? ?# IV access:  ? [name]- [phone] ? ?# DISPOSITION:  ?# proceed with  dara SQ today;  Zometa today ? ?# follow up in 1 weeks- NP: labs- HOLD tube; cbc/cmp; Dara SQ ? ?# follow up in  2 weeks- MD; labs- cbc/cmp; Dara SQ-Dr.B ? ? ? ?Follow-up instructions: ? ?I discussed the assessment and treatment plan with the patient.  The patient was provided an opportunity to ask questions and all were answered.  The patient agreed with the plan and demonstrated understanding of instructions. ? ?The patient was advised to call back or seek an in person evaluation if the symptoms worsen or if the condition fails to improve as anticipated. ? ?Dr. Charlaine Dalton ?[facility] ?07/01/2021 ?12:49 PM ?

## 2021-06-27 NOTE — Patient Instructions (Addendum)
[facility] AT [place]  Discharge Instructions: ?Thank you for choosing [facility] to provide your oncology and hematology care.  ?If you have a lab appointment with the [facility], please go directly to the [place] and check in at the registration area. ? ?Wear comfortable clothing and clothing appropriate for easy access to any Portacath or PICC line.  ? ?We strive to give you quality time with your provider. You may need to reschedule your appointment if you arrive late (15 or more minutes).  Arriving late affects you and other patients whose appointments are after yours.  Also, if you miss three or more appointments without notifying the office, you may be dismissed from the clinic at the provider?s discretion.    ?  ?For prescription refill requests, have your pharmacy contact our office and allow 72 hours for refills to be completed.   ? ?Today you received the following chemotherapy and/or immunotherapy agents DARZALEX and ZOMETA    ?  ?To help prevent nausea and vomiting after your treatment, we encourage you to take your nausea medication as directed. ? ?BELOW ARE SYMPTOMS THAT SHOULD BE REPORTED IMMEDIATELY: ?*FEVER GREATER THAN 100.4 F (38 ?C) OR HIGHER ?*CHILLS OR SWEATING ?*NAUSEA AND VOMITING THAT IS NOT CONTROLLED WITH YOUR NAUSEA MEDICATION ?*UNUSUAL SHORTNESS OF BREATH ?*UNUSUAL BRUISING OR BLEEDING ?*URINARY PROBLEMS (pain or burning when urinating, or frequent urination) ?*BOWEL PROBLEMS (unusual diarrhea, constipation, pain near the anus) ?TENDERNESS IN MOUTH AND THROAT WITH OR WITHOUT PRESENCE OF ULCERS (sore throat, sores in mouth, or a toothache) ?UNUSUAL RASH, SWELLING OR PAIN  ?UNUSUAL VAGINAL DISCHARGE OR ITCHING  ? ?Items with * indicate a potential emergency and should be followed up as soon as possible or go to the Emergency Department if any problems should occur. ? ?Please show the CHEMOTHERAPY ALERT CARD or IMMUNOTHERAPY ALERT CARD at  check-in to the Emergency Department and triage nurse. ? ?Should you have questions after your visit or need to cancel or reschedule your appointment, please contact [facility] [place] AT [place]  [phone] and follow the prompts.  Office hours are 8:00 a.m. to 4:30 p.m. Monday - Friday. Please note that voicemails left after 4:00 p.m. may not be returned until the following business day.  We are closed weekends and major holidays. You have access to a nurse at all times for urgent questions. Please call the main number to the clinic [phone] and follow the prompts. ? ?For any non-urgent questions, you may also contact your provider using MyChart. We now offer e-Visits for anyone 48 and older to request care online for non-urgent symptoms. For details visit [email or web address]. ?  ?Also download the MyChart app! Go to the app store, search "MyChart", open the app, select , and log in with your MyChart username and password. ? ?Due to Covid, a mask is required upon entering the hospital/clinic. If you do not have a mask, one will be given to you upon arrival. For doctor visits, patients may have 1 support person aged 50 or older with them. For treatment visits, patients cannot have anyone with them due to current Covid guidelines and our immunocompromised population.  ? ?Daratumumab injection ?What is this medication? ?DARATUMUMAB (dar a toom ue mab) is a monoclonal antibody. It is used to treat multiple myeloma. ?This medicine may be used for other purposes; ask your health care provider or pharmacist if you have questions. ?COMMON BRAND NAME(S): DARZALEX ?What should I tell my care team  before I take this medication? ?They need to know if you have any of these conditions: ?hereditary fructose intolerance ?infection (especially a virus infection such as chickenpox, herpes, or hepatitis B virus) ?lung or breathing disease (asthma, COPD) ?an unusual or allergic reaction to  daratumumab, sorbitol, other medicines, foods, dyes, or preservatives ?pregnant or trying to get pregnant ?breast-feeding ?How should I use this medication? ?This medicine is for infusion into a vein. It is given by a health care professional in a hospital or clinic setting. ?Talk to your pediatrician regarding the use of this medicine in children. Special care may be needed. ?Overdosage: If you think you have taken too much of this medicine contact a poison control center or emergency room at once. ?NOTE: This medicine is only for you. Do not share this medicine with others. ?What if I miss a dose? ?Keep appointments for follow-up doses as directed. It is important not to miss your dose. Call your doctor or health care professional if you are unable to keep an appointment. ?What may interact with this medication? ?Interactions have not been studied. ?This list may not describe all possible interactions. Give your health care provider a list of all the medicines, herbs, non-prescription drugs, or dietary supplements you use. Also tell them if you smoke, drink alcohol, or use illegal drugs. Some items may interact with your medicine. ?What should I watch for while using this medication? ?Your condition will be monitored carefully while you are receiving this medicine. ?This medicine can cause serious allergic reactions. To reduce your risk, your health care provider may give you other medicine to take before receiving this one. Be sure to follow the directions from your health care provider. ?This medicine can affect the results of blood tests to match your blood type. These changes can last for up to 6 months after the final dose. Your healthcare provider will do blood tests to match your blood type before you start treatment. Tell all of your healthcare providers that you are being treated with this medicine before receiving a blood transfusion. ?This medicine can affect the results of some tests used to determine  treatment response; extra tests may be needed to evaluate response. ?Do not become pregnant while taking this medicine or for 3 months after stopping it. Women should inform their health care provider if they wish to become pregnant or think they might be pregnant. There is a potential for serious side effects to an unborn child. Talk to your health care provider for more information. Do not breast-feed an infant while taking this medicine. ?What side effects may I notice from receiving this medication? ?Side effects that you should report to your care team as soon as possible: ?Allergic reactions--skin rash, itching or hives, swelling of the face, lips, or tongue ?Blurred vision ?Infection--fever, chills, cough, sore throat, pain or trouble passing urine ?Infusion reactions--dizziness, fast heartbeat, feeling faint or lightheaded, falls, headache, increase in blood pressure, nausea, vomiting, or wheezing or trouble breathing with loud or whistling sounds ?Unusual bleeding or bruising ?Side effects that usually do not require medical attention (report to your care team if they continue or are bothersome): ?Constipation ?Diarrhea ?Pain, tingling, numbness in the hands or feet ?Swelling of the ankles, feet, hands ?Tiredness ?This list may not describe all possible side effects. Call your doctor for medical advice about side effects. You may report side effects to FDA at 1-800-FDA-1088. ?Where should I keep my medication? ?This drug is given in a hospital or clinic and  will not be stored at home. ?NOTE: This sheet is a summary. It may not cover all possible information. If you have questions about this medicine, talk to your doctor, pharmacist, or health care provider. ?? 2022 Elsevier/Gold Standard (2020-08-09 00:00:00) ? ? ?Zoledronic Acid Injection (Hypercalcemia, Oncology) ?What is this medication? ?ZOLEDRONIC ACID (ZOE le dron ik AS id) slows calcium loss from bones. It high calcium levels in the blood from some  kinds of cancer. It may be used in other people at risk for bone loss. ?This medicine may be used for other purposes; ask your health care provider or pharmacist if you have questions. ?COMMON BRAND NAME(S): Zome

## 2021-06-27 NOTE — Progress Notes (Signed)
? ?Oral Chemotherapy Clinic ?[place]  ?[phone] [phone] ? ?Patient Care Team: ?Juline Patch, MD as PCP - General (Family Medicine) ?Vladimir Faster, [place] (Inactive) (Pharmacist) ?Cammie Sickle, MD as Consulting Physician (Oncology)  ? ?Name of the patient: Cristina Morgan  ?[id number]  ?07-May-1934  ? ?Date of visit: 06/27/21 ? ?HPI: Patient is a 87 y.o. female with newly diagnosed multiple myeloma currently receiving Revlimid (lenalidomide) and dexamethasone, started 05/30/21. Today, 06/27/21, daratumumab SQ will be added to patient's regimen. ? ?Reason for Consult: Oral chemotherapy follow-up for lenalidomide therapy. ? ? ?PAST MEDICAL HISTORY: ?Past Medical History:  ?Diagnosis Date  ? Anemia   ? CAD (coronary artery disease)   ? Cataracts, bilateral   ? Closed compression fracture of first lumbar vertebra ([place])   ? Closed compression fracture of second lumbar vertebra ([place])   ? Diabetes mellitus without complication ([place])   ? High cholesterol   ? History of pneumonia 2010  ? Legionnaires  ? History of uterine fibroid   ? Hypercalcemia   ? Hyperlipidemia   ? Hypertension   ? Lytic bone lesions on xray   ? Multiple myeloma ([place])   ? Osteoporosis   ? Primary osteoarthritis of right knee   ? ? ?HEMATOLOGY/ONCOLOGY HISTORY:  ?Oncology History Overview Note  ?# MULTIPLE MYELOMA  [FEB 2023-hypercalcemia status changes]-Active [bone lesions; hypercalcemia; anemia; No renal insuffiencey;Bone marrow-32% plasma cell-   STANDARD Cytogenetics].FEB 2023- S/p dexamethasone 20 mg q day x4. ? ? ?# FEB 2023-hypercalcemia [[facility]-status post calcitonin; bisphosphonate] ? ?BONE MARROW, ASPIRATE, CLOT, CORE:  ?-Hypercellular bone marrow with plasma cell neoplasm  ?-See comment  ? ?PERIPHERAL BLOOD:  ?-Normocytic-normochromic anemia  ? ?COMMENT:  ? ?The bone marrow is hypercellular for age with increased number of  ?atypical plasma cells representing 32% of all cells in the aspirate   ?associated with interstitial infiltrates and numerous variably sized  ?clusters in the clot/biopsy sections.  The plasma cells display weak  ?kappa light chain restriction consistent with plasma cell neoplasm.  ?Correlation with cytogenetic and FISH studies is recommended  ? ?MICROSCOPIC DESCRIPTION:  ? ?PERIPHERAL BLOOD SMEAR: The red blood cells display mild  ?anisopoikilocytosis with mild polychromasia.  The white blood cells are  ?normal number with scattered hypogranular neutrophils. An occasional  ?myelocyte and large atypical mononuclear cells are seen on scan. The  ?platelets are normal in number.  ?  ?Multiple myeloma not having achieved remission ([place])  ?05/15/2021 Initial Diagnosis  ? Multiple myeloma not having achieved remission ([place]) ?  ?06/27/2021 -  Chemotherapy  ? Patient is on Treatment Plan : MYELOMA Daratumumab SQ + Lenalidomide + Dexamethasone (DaraRd) q28d  ?   ? ? ?ALLERGIES:  is allergic to latex and penicillins. ? ?MEDICATIONS:  ?Current Outpatient Medications  ?Medication Sig Dispense Refill  ? acetaminophen (TYLENOL) 325 MG tablet Take 650 mg by mouth every 6 (six) hours as needed.    ? acetaminophen (TYLENOL) 325 MG tablet Take 325 mg by mouth 2 (two) times daily. START 2 days prior to chemo infusion. Take For 2 days; Do NOT take on the day of infusion    ? acyclovir (ZOVIRAX) 400 MG tablet Take 1 tablet (400 mg total) by mouth 2 (two) times daily. 60 tablet 4  ? baclofen (LIORESAL) 20 MG tablet Take 1 tablet (20 mg total) by mouth 2 (two) times daily. 30 tablet 0  ? BD PEN NEEDLE NANO 2ND GEN 32G X 4 MM MISC USE 1 ONCE DAILY    ?  Calcium Carbonate-Vit D-Min (CALCIUM 1200 PO) Take 1 capsule by mouth daily.    ? carvedilol (COREG) 3.125 MG tablet Take 1 tablet (3.125 mg total) by mouth 2 (two) times daily with a meal. 60 tablet 1  ? cholecalciferol (VITAMIN D3) 25 MCG (1000 UNIT) tablet Take 1,000 Units by mouth daily.    ? dexamethasone (DECADRON) 4 MG tablet Take 1 tablet (4 mg total) by  mouth 2 (two) times daily. Start 2 days prior to infusion; Take it for 2 days. 60 tablet 3  ? Glucerna (GLUCERNA) LIQD Take 237 mLs by mouth.    ? hydrOXYzine (ATARAX) 10 MG tablet Take 1 tablet (10 mg total) by mouth at bedtime as needed. 90 tablet 1  ? insulin glargine (LANTUS SOLOSTAR) 100 UNIT/ML Solostar Pen Inject 5 Units into the skin daily. 15 mL PRN  ? lenalidomide (REVLIMID) 15 MG capsule Take 1 capsule (15 mg total) by mouth daily. Take for 21 days, then hold for 7 days. Repeat every 28 days. (Patient not taking: Reported on 06/27/2021) 21 capsule 0  ? losartan (COZAAR) 25 MG tablet TAKE ONE TABLET BY MOUTH EVERY EVENING 90 tablet 0  ? lovastatin (MEVACOR) 40 MG tablet Take 1 tablet (40 mg total) by mouth every evening. 90 tablet 1  ? metFORMIN (GLUCOPHAGE) 1000 MG tablet TAKE ONE TABLET BY MOUTH TWICE DAILY 180 tablet 0  ? montelukast (SINGULAIR) 10 MG tablet Take 1 tablet (10 mg total) by mouth daily. START 2 days prior to chemo infusion. Take For 2 days; Do NOT take on the day of infusion. 20 tablet 1  ? Multiple Vitamin (MULTIVITAMIN ADULT PO) Take by mouth.    ? Omega-3 Fatty Acids (FISH OIL) 1000 MG CAPS Take 1 capsule (1,000 mg total) by mouth daily. 100 capsule 3  ? ondansetron (ZOFRAN) 4 MG tablet Take 1 tablet (4 mg total) by mouth every 6 (six) hours as needed for nausea. 20 tablet 0  ? pantoprazole (PROTONIX) 40 MG tablet Take 1 tablet (40 mg total) by mouth daily. 90 tablet 1  ? potassium chloride SA (KLOR-CON M) 20 MEQ tablet Take 1 tablet (20 mEq total) by mouth 2 (two) times daily for 14 days. 28 tablet 0  ? sertraline (ZOLOFT) 25 MG tablet Take 1 tablet (25 mg total) by mouth daily. 90 tablet 1  ? ?No current facility-administered medications for this visit.  ? ?Facility-Administered Medications Ordered in Other Visits  ?Medication Dose Route Frequency Provider Last Rate Last Admin  ? daratumumab-hyaluronidase-fihj (DARZALEX FASPRO) 1800-30000 MG-UT/15ML chemo SQ injection 1,800 mg  1,800  mg Subcutaneous Once Cammie Sickle, MD      ? ? ?VITAL SIGNS: ?There were no vitals taken for this visit. ?There were no vitals filed for this visit.  ?Estimated body mass index is 20.97 kg/m? as calculated from the following: ?  Height as of 06/13/21: '5\' 6"'  (1.676 m). ?  Weight as of an earlier encounter on 06/27/21: 58.9 kg (129 lb 14.4 oz). ? ?LABS: ?CBC: ?   ?Component Value Date/Time  ? WBC 6.3 06/27/2021 0916  ? HGB 9.1 (L) 06/27/2021 0916  ? HGB 9.5 (L) 04/15/2021 1549  ? HCT 28.5 (L) 06/27/2021 0916  ? HCT 29.8 (L) 04/15/2021 1549  ? PLT 509 (H) 06/27/2021 0916  ? PLT 291 04/15/2021 1549  ? MCV 96.6 06/27/2021 0916  ? MCV 98 (H) 04/15/2021 1549  ? NEUTROABS 2.9 06/27/2021 0916  ? NEUTROABS 3.7 04/15/2021 1549  ? LYMPHSABS 2.4 06/27/2021  1252  ? LYMPHSABS 2.6 04/15/2021 1549  ? MONOABS 0.8 06/27/2021 0916  ? EOSABS 0.0 06/27/2021 0916  ? EOSABS 0.1 04/15/2021 1549  ? BASOSABS 0.1 06/27/2021 0916  ? BASOSABS 0.0 04/15/2021 1549  ? ?Comprehensive Metabolic Panel: ?   ?Component Value Date/Time  ? NA 135 06/27/2021 0916  ? NA 138 02/01/2021 1502  ? K 3.5 06/27/2021 0916  ? CL 102 06/27/2021 0916  ? CO2 24 06/27/2021 0916  ? BUN 21 06/27/2021 0916  ? BUN 19 02/01/2021 1502  ? CREATININE 0.88 06/27/2021 0916  ? GLUCOSE 198 (H) 06/27/2021 0916  ? CALCIUM 9.4 06/27/2021 0916  ? CALCIUM 11.0 (H) 05/02/2021 7129  ? AST 27 06/27/2021 0916  ? ALT 16 06/27/2021 0916  ? ALKPHOS 119 06/27/2021 0916  ? BILITOT 0.4 06/27/2021 0916  ? BILITOT 0.3 04/15/2021 1549  ? PROT 7.0 06/27/2021 0916  ? PROT 7.6 07/24/2020 1104  ? ALBUMIN 3.5 06/27/2021 0916  ? ALBUMIN 4.4 04/15/2021 1549  ? ? ? ?Present during today's visit: patient only, seen in infusion (**spoke with patient's daughter in lobby, who stated her mom is doing well) ? ?Assessment and Plan: ?Reviewed CBC and CMP with patient, continue lenalidomide 15 mg po daily, 21 days on, 7 days off. ?Patient and daughter endorse issues with nutrition. Patient scheduled to see  dietitian at next office visit. ?  ?Oral Chemotherapy Side Effect/Intolerance:  ?No reported diarrhea, constipation, rash/itchy skin, nausea, or vomiting. ? ?Oral Chemotherapy Adherence: no reported missed d

## 2021-06-27 NOTE — Progress Notes (Signed)
Nutrition ? ?Patient on RD's schedule today to see in infusion.  RD spoke with daughter [name] and daughter prefers to meet with patient and RD.  Nutrition appointment rescheduled for 4/20 after infusion.  Daughter agreeable ? ?Cristina Morgan B. [name], RD, LDN ?Registered Dietitian ?[phone] ? ?

## 2021-06-27 NOTE — Progress Notes (Signed)
Patient reports a decrease in appetite and change in taste.  Has a 4 lb wt loss and does drink 2-3 Glucerna a day. ? ?Patient is due to start Revlimid today but has not received medication yet (last rx sent to local pharmacy not specialty pharmacy) ?

## 2021-06-28 LAB — KAPPA/LAMBDA LIGHT CHAINS
Kappa free light chain: 39.1 mg/L — ABNORMAL HIGH (ref 3.3–19.4)
Kappa, lambda light chain ratio: 1.86 — ABNORMAL HIGH (ref 0.26–1.65)
Lambda free light chains: 21 mg/L (ref 5.7–26.3)

## 2021-07-01 ENCOUNTER — Encounter: Payer: Self-pay | Admitting: Internal Medicine

## 2021-07-01 ENCOUNTER — Encounter: Payer: Self-pay | Admitting: Family Medicine

## 2021-07-01 ENCOUNTER — Ambulatory Visit (INDEPENDENT_AMBULATORY_CARE_PROVIDER_SITE_OTHER): Payer: Medicare Other | Admitting: Family Medicine

## 2021-07-01 VITALS — BP 138/70 | HR 70 | Ht 66.0 in | Wt 119.0 lb

## 2021-07-01 DIAGNOSIS — E119 Type 2 diabetes mellitus without complications: Secondary | ICD-10-CM | POA: Diagnosis not present

## 2021-07-01 DIAGNOSIS — R609 Edema, unspecified: Secondary | ICD-10-CM | POA: Diagnosis not present

## 2021-07-01 LAB — MULTIPLE MYELOMA PANEL, SERUM
Albumin SerPl Elph-Mcnc: 3.5 g/dL (ref 2.9–4.4)
Albumin/Glob SerPl: 1.2 (ref 0.7–1.7)
Alpha 1: 0.3 g/dL (ref 0.0–0.4)
Alpha2 Glob SerPl Elph-Mcnc: 0.8 g/dL (ref 0.4–1.0)
B-Globulin SerPl Elph-Mcnc: 1 g/dL (ref 0.7–1.3)
Gamma Glob SerPl Elph-Mcnc: 1.1 g/dL (ref 0.4–1.8)
Globulin, Total: 3.1 g/dL (ref 2.2–3.9)
IgA: 143 mg/dL (ref 64–422)
IgG (Immunoglobin G), Serum: 1292 mg/dL (ref 586–1602)
IgM (Immunoglobulin M), Srm: 54 mg/dL (ref 26–217)
M Protein SerPl Elph-Mcnc: 0.6 g/dL — ABNORMAL HIGH
Total Protein ELP: 6.6 g/dL (ref 6.0–8.5)

## 2021-07-01 NOTE — Progress Notes (Signed)
Hi-I wanted to let you informed that multiple myeloma marker are improved.  Hopefully this will keep getting better with treatments. Thanks ?GB

## 2021-07-01 NOTE — Progress Notes (Signed)
? ? ?Date:  07/01/2021  ? ?Name:  Cristina Morgan   DOB:  10-25-1934   MRN:  [id number] ? ? ?Chief Complaint: Diabetes (Needs foot exam) ? ?Diabetes ?She presents for her follow-up diabetic visit. She has type 2 diabetes mellitus. Her disease course has been stable. There are no hypoglycemic associated symptoms. Pertinent negatives for hypoglycemia include no dizziness, headaches or nervousness/anxiousness. There are no diabetic associated symptoms. Pertinent negatives for diabetes include no fatigue and no weakness. There are no hypoglycemic complications. Symptoms are stable. There are no diabetic complications. Current diabetic treatment includes insulin injections. She rarely participates in exercise. Her breakfast blood glucose is taken between 8-9 am. Her breakfast blood glucose range is generally 140-180 mg/dl. An ACE inhibitor/angiotensin II receptor blocker is being taken.  ? ?Lab Results  ?Component Value Date  ? NA 135 06/27/2021  ? K 3.5 06/27/2021  ? CO2 24 06/27/2021  ? GLUCOSE 198 (H) 06/27/2021  ? BUN 21 06/27/2021  ? CREATININE 0.88 06/27/2021  ? CALCIUM 9.4 06/27/2021  ? EGFR 68 02/01/2021  ? GFRNONAA >60 06/27/2021  ? ?Lab Results  ?Component Value Date  ? CHOL 176 07/24/2020  ? HDL 74 07/24/2020  ? [place] 90 07/24/2020  ? TRIG 63 07/24/2020  ? CHOLHDL 2.7 03/01/2018  ? ?Lab Results  ?Component Value Date  ? TSH 0.367 05/03/2021  ? ?Lab Results  ?Component Value Date  ? HGBA1C 5.9 (H) 02/01/2021  ? ?Lab Results  ?Component Value Date  ? WBC 6.3 06/27/2021  ? HGB 9.1 (L) 06/27/2021  ? HCT 28.5 (L) 06/27/2021  ? MCV 96.6 06/27/2021  ? PLT 509 (H) 06/27/2021  ? ?Lab Results  ?Component Value Date  ? ALT 16 06/27/2021  ? AST 27 06/27/2021  ? ALKPHOS 119 06/27/2021  ? BILITOT 0.4 06/27/2021  ? ?No results found for: 25OHVITD2, [place], VD25OH  ? ?Review of Systems  ?Constitutional:  Positive for unexpected weight change. Negative for chills, fatigue and fever.  ?HENT:  Negative for congestion, ear  discharge, ear pain, rhinorrhea, sinus pressure, sneezing and sore throat.   ?Respiratory:  Negative for cough, shortness of breath, wheezing and stridor.   ?Gastrointestinal:  Negative for abdominal pain, blood in stool, constipation, diarrhea and nausea.  ?Genitourinary:  Negative for dysuria, flank pain, frequency, hematuria, urgency and vaginal discharge.  ?Musculoskeletal:  Negative for arthralgias, back pain and myalgias.  ?Skin:  Negative for rash.  ?Neurological:  Negative for dizziness, weakness and headaches.  ?Hematological:  Negative for adenopathy. Does not bruise/bleed easily.  ?Psychiatric/Behavioral:  Negative for dysphoric mood. The patient is not nervous/anxious.   ? ?Patient Active Problem List  ? Diagnosis Date Noted  ? Multiple myeloma not having achieved remission ([place]) 05/15/2021  ? Pressure injury of coccygeal region, stage 2 ([place]) 05/07/2021  ? Hyponatremia 05/04/2021  ? Pressure injury of skin 05/02/2021  ? Vitamin B12 deficiency 05/02/2021  ? Hypomagnesemia 05/02/2021  ? Hypophosphatemia 05/02/2021  ? SVT (supraventricular tachycardia) ([place]) 05/02/2021  ? Altered mental status 05/01/2021  ? Acute metabolic encephalopathy 34/19/6222  ? Hypercalcemia 05/01/2021  ? Closed compression fracture of second lumbar vertebra ([place]) 10/28/2020  ? Need for vaccination against Streptococcus pneumoniae using pneumococcal conjugate vaccine 13 01/07/2017  ? Primary osteoarthritis of right knee 12/08/2016  ? PNA (pneumonia) 07/09/2015  ? Acute chest pain 03/20/2014  ? Type 2 diabetes mellitus ([place]) 03/20/2014  ? Hypercholesterolemia 03/20/2014  ? Essential hypertension 03/20/2014  ? ? ?Allergies  ?Allergen Reactions  ? Latex Rash  ?  Penicillins Other (See Comments)  ? ? ?Past Surgical History:  ?Procedure Laterality Date  ? CATARACT EXTRACTION Bilateral 2014  ? COLONOSCOPY  2011  ? cleared for 5 yrs- [facility]  ? ECTOPIC PREGNANCY SURGERY    ? ? ?Social History  ? ?Tobacco Use  ? Smoking status: Never  ?   Passive exposure: Never  ? Smokeless tobacco: Never  ? Tobacco comments:  ?  smoking cessation materials not required  ?Vaping Use  ? Vaping Use: Never used  ?Substance Use Topics  ? Alcohol use: No  ? Drug use: No  ? ? ? ?Medication list has been reviewed and updated. ? ?Current Meds  ?Medication Sig  ? acetaminophen (TYLENOL) 325 MG tablet Take 650 mg by mouth every 6 (six) hours as needed.  ? acetaminophen (TYLENOL) 325 MG tablet Take 325 mg by mouth 2 (two) times daily. START 2 days prior to chemo infusion. Take For 2 days; Do NOT take on the day of infusion  ? acyclovir (ZOVIRAX) 400 MG tablet Take 1 tablet (400 mg total) by mouth 2 (two) times daily.  ? baclofen (LIORESAL) 20 MG tablet Take 1 tablet (20 mg total) by mouth 2 (two) times daily.  ? BD PEN NEEDLE NANO 2ND GEN 32G X 4 MM MISC USE 1 ONCE DAILY  ? Calcium Carbonate-Vit D-Min (CALCIUM 1200 PO) Take 1 capsule by mouth daily.  ? carvedilol (COREG) 3.125 MG tablet Take 1 tablet (3.125 mg total) by mouth 2 (two) times daily with a meal.  ? cholecalciferol (VITAMIN D3) 25 MCG (1000 UNIT) tablet Take 1,000 Units by mouth daily.  ? dexamethasone (DECADRON) 4 MG tablet Take 1 tablet (4 mg total) by mouth 2 (two) times daily. Start 2 days prior to infusion; Take it for 2 days.  ? Glucerna (GLUCERNA) LIQD Take 237 mLs by mouth.  ? hydrOXYzine (ATARAX) 10 MG tablet Take 1 tablet (10 mg total) by mouth at bedtime as needed.  ? insulin glargine (LANTUS SOLOSTAR) 100 UNIT/ML Solostar Pen Inject 5 Units into the skin daily. (Patient taking differently: Inject 6 Units into the skin daily.)  ? losartan (COZAAR) 25 MG tablet TAKE ONE TABLET BY MOUTH EVERY EVENING  ? lovastatin (MEVACOR) 40 MG tablet Take 1 tablet (40 mg total) by mouth every evening.  ? metFORMIN (GLUCOPHAGE) 1000 MG tablet TAKE ONE TABLET BY MOUTH TWICE DAILY  ? montelukast (SINGULAIR) 10 MG tablet Take 1 tablet (10 mg total) by mouth daily. START 2 days prior to chemo infusion. Take For 2 days; Do  NOT take on the day of infusion.  ? Multiple Vitamin (MULTIVITAMIN ADULT PO) Take by mouth.  ? Omega-3 Fatty Acids (FISH OIL) 1000 MG CAPS Take 1 capsule (1,000 mg total) by mouth daily.  ? ondansetron (ZOFRAN) 4 MG tablet Take 1 tablet (4 mg total) by mouth every 6 (six) hours as needed for nausea.  ? pantoprazole (PROTONIX) 40 MG tablet Take 1 tablet (40 mg total) by mouth daily.  ? potassium chloride SA (KLOR-CON M) 20 MEQ tablet Take 1 tablet (20 mEq total) by mouth 2 (two) times daily for 14 days.  ? sertraline (ZOLOFT) 25 MG tablet Take 1 tablet (25 mg total) by mouth daily.  ? ? ? ?  07/01/2021  ?  3:04 PM 05/17/2021  ?  1:46 PM 02/01/2021  ?  2:15 PM 12/12/2020  ?  8:36 AM  ?GAD 7 : Generalized Anxiety Score  ?Nervous, Anxious, on Edge 0 0 0 0  ?  Control/stop worrying 0 1 0 0  ?Worry too much - different things 0 1 0 0  ?Trouble relaxing 0 0 1 0  ?Restless 0 0  0  ?Easily annoyed or irritable 0 0 0 0  ?Afraid - awful might happen 0 0 0 0  ?Total GAD 7 Score 0 2  0  ?Anxiety Difficulty Not difficult at all Not difficult at all    ? ? ? ?  07/01/2021  ?  3:04 PM  ?Depression screen PHQ 2/9  ?Decreased Interest 0  ?Down, Depressed, Hopeless 0  ?PHQ - 2 Score 0  ?Altered sleeping 0  ?Tired, decreased energy 0  ?Change in appetite 0  ?Feeling bad or failure about yourself  0  ?Trouble concentrating 0  ?Moving slowly or fidgety/restless 0  ?Suicidal thoughts 0  ?PHQ-9 Score 0  ?Difficult doing work/chores Not difficult at all  ? ? ?BP Readings from Last 3 Encounters:  ?07/01/21 138/70  ?06/27/21 (!) 165/75  ?06/27/21 (!) 129/56  ? ? ?Physical Exam ?Vitals and nursing note reviewed.  ?Constitutional:   ?   Appearance: She is well-developed.  ?HENT:  ?   Head: Normocephalic.  ?   Right Ear: External ear normal.  ?   Left Ear: External ear normal.  ?Eyes:  ?   General: Lids are everted, no foreign bodies appreciated. No scleral icterus.    ?   Left eye: No foreign body or hordeolum.  ?   Conjunctiva/sclera: Conjunctivae  normal.  ?   Right eye: Right conjunctiva is not injected.  ?   Left eye: Left conjunctiva is not injected.  ?   Pupils: Pupils are equal, round, and reactive to light.  ?Neck:  ?   Thyroid: No thyromegaly.  ?   V

## 2021-07-02 DIAGNOSIS — E871 Hypo-osmolality and hyponatremia: Secondary | ICD-10-CM | POA: Diagnosis not present

## 2021-07-02 DIAGNOSIS — M8448XD Pathological fracture, other site, subsequent encounter for fracture with routine healing: Secondary | ICD-10-CM | POA: Diagnosis not present

## 2021-07-02 DIAGNOSIS — I10 Essential (primary) hypertension: Secondary | ICD-10-CM | POA: Diagnosis not present

## 2021-07-02 DIAGNOSIS — E119 Type 2 diabetes mellitus without complications: Secondary | ICD-10-CM | POA: Diagnosis not present

## 2021-07-02 DIAGNOSIS — E7800 Pure hypercholesterolemia, unspecified: Secondary | ICD-10-CM | POA: Diagnosis not present

## 2021-07-02 LAB — SPECIMEN STATUS REPORT

## 2021-07-02 LAB — HEMOGLOBIN A1C
Est. average glucose Bld gHb Est-mCnc: 209 mg/dL
Hgb A1c MFr Bld: 8.9 % — ABNORMAL HIGH (ref 4.8–5.6)

## 2021-07-03 DIAGNOSIS — Z20822 Contact with and (suspected) exposure to covid-19: Secondary | ICD-10-CM | POA: Diagnosis not present

## 2021-07-04 ENCOUNTER — Encounter: Payer: Self-pay | Admitting: Nurse Practitioner

## 2021-07-04 ENCOUNTER — Ambulatory Visit: Payer: Medicare Other

## 2021-07-04 ENCOUNTER — Other Ambulatory Visit: Payer: Self-pay

## 2021-07-04 ENCOUNTER — Inpatient Hospital Stay: Payer: Medicare Other

## 2021-07-04 ENCOUNTER — Inpatient Hospital Stay (HOSPITAL_BASED_OUTPATIENT_CLINIC_OR_DEPARTMENT_OTHER): Payer: Medicare Other | Admitting: Nurse Practitioner

## 2021-07-04 VITALS — BP 139/62 | HR 68 | Temp 96.3°F | Resp 18 | Wt 120.9 lb

## 2021-07-04 VITALS — BP 166/72 | HR 78 | Temp 97.8°F

## 2021-07-04 DIAGNOSIS — E1136 Type 2 diabetes mellitus with diabetic cataract: Secondary | ICD-10-CM | POA: Diagnosis not present

## 2021-07-04 DIAGNOSIS — C9000 Multiple myeloma not having achieved remission: Secondary | ICD-10-CM | POA: Diagnosis not present

## 2021-07-04 DIAGNOSIS — Z794 Long term (current) use of insulin: Secondary | ICD-10-CM

## 2021-07-04 DIAGNOSIS — D649 Anemia, unspecified: Secondary | ICD-10-CM | POA: Diagnosis not present

## 2021-07-04 DIAGNOSIS — R630 Anorexia: Secondary | ICD-10-CM | POA: Diagnosis not present

## 2021-07-04 DIAGNOSIS — E119 Type 2 diabetes mellitus without complications: Secondary | ICD-10-CM | POA: Diagnosis not present

## 2021-07-04 DIAGNOSIS — I10 Essential (primary) hypertension: Secondary | ICD-10-CM | POA: Diagnosis not present

## 2021-07-04 DIAGNOSIS — Z5112 Encounter for antineoplastic immunotherapy: Secondary | ICD-10-CM | POA: Diagnosis not present

## 2021-07-04 LAB — COMPREHENSIVE METABOLIC PANEL
ALT: 15 U/L (ref 0–44)
AST: 14 U/L — ABNORMAL LOW (ref 15–41)
Albumin: 3.9 g/dL (ref 3.5–5.0)
Alkaline Phosphatase: 148 U/L — ABNORMAL HIGH (ref 38–126)
Anion gap: 5 (ref 5–15)
BUN: 29 mg/dL — ABNORMAL HIGH (ref 8–23)
CO2: 22 mmol/L (ref 22–32)
Calcium: 9.2 mg/dL (ref 8.9–10.3)
Chloride: 105 mmol/L (ref 98–111)
Creatinine, Ser: 0.98 mg/dL (ref 0.44–1.00)
GFR, Estimated: 57 mL/min — ABNORMAL LOW (ref >60)
Glucose, Bld: 245 mg/dL — ABNORMAL HIGH (ref 70–99)
Potassium: 4.8 mmol/L (ref 3.5–5.1)
Sodium: 132 mmol/L — ABNORMAL LOW (ref 135–145)
Total Bilirubin: 0.6 mg/dL (ref 0.3–1.2)
Total Protein: 7.2 g/dL (ref 6.5–8.1)

## 2021-07-04 LAB — SAMPLE TO BLOOD BANK

## 2021-07-04 LAB — CBC WITH DIFFERENTIAL/PLATELET
Abs Immature Granulocytes: 0.01 10*3/uL (ref 0.00–0.07)
Basophils Absolute: 0.1 10*3/uL (ref 0.0–0.1)
Basophils Relative: 1 %
Eosinophils Absolute: 0 10*3/uL (ref 0.0–0.5)
Eosinophils Relative: 0 %
HCT: 29.6 % — ABNORMAL LOW (ref 36.0–46.0)
Hemoglobin: 9.4 g/dL — ABNORMAL LOW (ref 12.0–15.0)
Immature Granulocytes: 0 %
Lymphocytes Relative: 32 %
Lymphs Abs: 1.6 10*3/uL (ref 0.7–4.0)
MCH: 30.5 pg (ref 26.0–34.0)
MCHC: 31.8 g/dL (ref 30.0–36.0)
MCV: 96.1 fL (ref 80.0–100.0)
Monocytes Absolute: 0.7 10*3/uL (ref 0.1–1.0)
Monocytes Relative: 13 %
Neutro Abs: 2.7 10*3/uL (ref 1.7–7.7)
Neutrophils Relative %: 54 %
Platelets: 364 10*3/uL (ref 150–400)
RBC: 3.08 MIL/uL — ABNORMAL LOW (ref 3.87–5.11)
RDW: 15.8 % — ABNORMAL HIGH (ref 11.5–15.5)
WBC: 5 10*3/uL (ref 4.0–10.5)
nRBC: 0 % (ref 0.0–0.2)

## 2021-07-04 MED ORDER — DARATUMUMAB-HYALURONIDASE-FIHJ 1800-30000 MG-UT/15ML ~~LOC~~ SOLN
1800.0000 mg | Freq: Once | SUBCUTANEOUS | Status: AC
  Administered 2021-07-04: 1800 mg via SUBCUTANEOUS
  Filled 2021-07-04: qty 15

## 2021-07-04 MED ORDER — DIPHENHYDRAMINE HCL 25 MG PO CAPS
50.0000 mg | ORAL_CAPSULE | Freq: Once | ORAL | Status: AC
  Administered 2021-07-04: 50 mg via ORAL
  Filled 2021-07-04: qty 2

## 2021-07-04 MED ORDER — DEXAMETHASONE 4 MG PO TABS
20.0000 mg | ORAL_TABLET | Freq: Once | ORAL | Status: AC
  Administered 2021-07-04: 20 mg via ORAL
  Filled 2021-07-04: qty 5

## 2021-07-04 MED ORDER — SODIUM CHLORIDE 0.9 % IV SOLN
Freq: Once | INTRAVENOUS | Status: AC
  Filled 2021-07-04: qty 250

## 2021-07-04 MED ORDER — MONTELUKAST SODIUM 10 MG PO TABS
10.0000 mg | ORAL_TABLET | Freq: Once | ORAL | Status: AC
  Administered 2021-07-04: 10 mg via ORAL
  Filled 2021-07-04: qty 1

## 2021-07-04 MED ORDER — ACETAMINOPHEN 325 MG PO TABS
650.0000 mg | ORAL_TABLET | Freq: Once | ORAL | Status: AC
  Administered 2021-07-04: 650 mg via ORAL
  Filled 2021-07-04: qty 2

## 2021-07-04 NOTE — Progress Notes (Signed)
Nutrition Assessment ? ? ?Reason for Assessment:  ?Weight loss, elevated blood glucose ? ? ?ASSESSMENT:  ?87 year old female with multiple myeloma.  Past medical history of compression fractures, HLD, DM, HTN, CAD.  Patient receiving daratumumab.   ? ?Met with patient and daughter [name] following treatment today.  Patient reports that her appetite is poor and has been before starting treatment (> 38month.  She reports taste alterations.  She is afraid to eat foods because fear of her blood sugar going up.  Drinks glucerna shake 2-3 times per day.  Usually eats boiled egg for breakfast or egg white with t[place]bacon or smoothie (mixed with almond milk, spinach, oatmeal, fruit), sometimes yogurt.  Lunch maybe peanut butter crackers or pimento cheese and crackers.   ? ? ? ?Nutrition Focused Physical Exam:  ? ?Not performed at this visit ? ? ?Medications: dexamethasone, MVI, zforan, KCL lantus, metformin ? ? ?Labs: glucose 245 (steroids taken) ? ? ?Anthropometrics:  ? ?Height: 66 inches ?Weight: 120 lb 14.4 oz (some fluid weight loss) ?129 lb 14.4 oz on 4/13 ?138 lb 4.8 oz on 3/16 ? ?BMI: 19 ? ?13% weight loss in the last month, significant (some could be fluid weight loss) ? ? ?Estimated Energy Needs ? ?Kcals: 1350-1600 ?Protein: 68-80 g ?Fluid: 1350-16027m? ? ?NUTRITION DIAGNOSIS: Inadequate oral intake related to cancer treatment related side effects as evidenced by 13% weight loss in the last month, eating < 50% of estimated energy needs for > or equal to 5 days ? ? ?INTERVENTION:  ?Recommend patient drink sugar free beverages and utilize sugar free items if available( ie sugar free syrup or sugar free ice cream).  Would NOT place any other restrictions on patient's diet at this time. Would recommend controlling elevated blood glucose with medication vs restricting diet at this time as patient with poor po intake, significant weight loss and taste alterations.   ?Discussed strategies to help with taste change.    ?Encouraged patient to add calories to food (ie butter, dressings, whole milk products, etc) ?Continue glucerna 2-3 times per day ?Contact information provided ? ? ?MONITORING, EVALUATION, GOAL: weight trends, intake ? ? ?Next Visit: Thursday, May 4 following infusion ? ?Jasmain Ahlberg B. AlZenia ResidesRD, LDN ?Registered Dietitian ?[phone] ? ? ? ? ?

## 2021-07-04 NOTE — Progress Notes (Signed)
Patient here today for follow up and treatment consideration regarding multiple myeloma. Patient denies concerns today. Patients daughter reports 9 lb weight loss and decreased appetite. Patient is having difficulty sleeping at night, has hydroxyzine but has not been taking. ?

## 2021-07-04 NOTE — Patient Instructions (Signed)
[facility] CANCER CTR AT [place]  Discharge Instructions: ?Thank you for choosing [facility] to provide your oncology and hematology care.  ?If you have a lab appointment with the [facility], please go directly to the [facility] and check in at the registration area. ? ?Wear comfortable clothing and clothing appropriate for easy access to any Portacath or PICC line.  ? ?We strive to give you quality time with your provider. You may need to reschedule your appointment if you arrive late (15 or more minutes).  Arriving late affects you and other patients whose appointments are after yours.  Also, if you miss three or more appointments without notifying the office, you may be dismissed from the clinic at the provider?s discretion.    ?  ?For prescription refill requests, have your pharmacy contact our office and allow 72 hours for refills to be completed.   ? ?Today you received the following chemotherapy and/or immunotherapy agents: daratumumab ?  ?To help prevent nausea and vomiting after your treatment, we encourage you to take your nausea medication as directed. ? ?BELOW ARE SYMPTOMS THAT SHOULD BE REPORTED IMMEDIATELY: ?*FEVER GREATER THAN 100.4 F (38 ?C) OR HIGHER ?*CHILLS OR SWEATING ?*NAUSEA AND VOMITING THAT IS NOT CONTROLLED WITH YOUR NAUSEA MEDICATION ?*UNUSUAL SHORTNESS OF BREATH ?*UNUSUAL BRUISING OR BLEEDING ?*URINARY PROBLEMS (pain or burning when urinating, or frequent urination) ?*BOWEL PROBLEMS (unusual diarrhea, constipation, pain near the anus) ?TENDERNESS IN MOUTH AND THROAT WITH OR WITHOUT PRESENCE OF ULCERS (sore throat, sores in mouth, or a toothache) ?UNUSUAL RASH, SWELLING OR PAIN  ?UNUSUAL VAGINAL DISCHARGE OR ITCHING  ? ?Items with * indicate a potential emergency and should be followed up as soon as possible or go to the Emergency Department if any problems should occur. ? ?Please show the CHEMOTHERAPY ALERT CARD or IMMUNOTHERAPY ALERT CARD at check-in to  the Emergency Department and triage nurse. ? ?Should you have questions after your visit or need to cancel or reschedule your appointment, please contact [facility] CANCER [place] AT [place]  [phone] and follow the prompts.  Office hours are 8:00 a.m. to 4:30 p.m. Monday - Friday. Please note that voicemails left after 4:00 p.m. may not be returned until the following business day.  We are closed weekends and major holidays. You have access to a nurse at all times for urgent questions. Please call the main number to the clinic [phone] and follow the prompts. ? ?For any non-urgent questions, you may also contact your provider using MyChart. We now offer e-Visits for anyone 61 and older to request care online for non-urgent symptoms. For details visit [email or web address]. ?  ?Also download the MyChart app! Go to the app store, search "MyChart", open the app, select [facility], and log in with your MyChart username and password. ? ?Due to Covid, a mask is required upon entering the hospital/clinic. If you do not have a mask, one will be given to you upon arrival. For doctor visits, patients may have 1 support person aged 35 or older with them. For treatment visits, patients cannot have anyone with them due to current Covid guidelines and our immunocompromised population.  ?

## 2021-07-04 NOTE — Progress Notes (Signed)
[facility] ?CONSULT NOTE ? ?Patient Care Team: ?Juline Patch, MD as PCP - General (Family Medicine) ?Vladimir Faster, [place] (Inactive) (Pharmacist) ?Cammie Sickle, MD as Consulting Physician (Oncology) ? ?CHIEF COMPLAINTS/PURPOSE OF CONSULTATION: Multiple myeloma ? ?Oncology History Overview Note  ?# MULTIPLE MYELOMA  [FEB 2023-hypercalcemia status changes]-Active [bone lesions; hypercalcemia; anemia; No renal insuffiencey;Bone marrow-32% plasma cell-   STANDARD Cytogenetics].FEB 2023- S/p dexamethasone 20 mg q day x4. ? ? ?# FEB 2023-hypercalcemia [[facility]-status post calcitonin; bisphosphonate] ? ?BONE MARROW, ASPIRATE, CLOT, CORE:  ?-Hypercellular bone marrow with plasma cell neoplasm  ?-See comment  ? ?PERIPHERAL BLOOD:  ?-Normocytic-normochromic anemia  ? ?COMMENT:  ? ?The bone marrow is hypercellular for age with increased number of  ?atypical plasma cells representing 32% of all cells in the aspirate  ?associated with interstitial infiltrates and numerous variably sized  ?clusters in the clot/biopsy sections.  The plasma cells display weak  ?kappa light chain restriction consistent with plasma cell neoplasm.  ?Correlation with cytogenetic and FISH studies is recommended  ? ?MICROSCOPIC DESCRIPTION:  ? ?PERIPHERAL BLOOD SMEAR: The red blood cells display mild  ?anisopoikilocytosis with mild polychromasia.  The white blood cells are  ?normal number with scattered hypogranular neutrophils. An occasional  ?myelocyte and large atypical mononuclear cells are seen on scan. The  ?platelets are normal in number.  ?  ?Multiple myeloma not having achieved remission ([place])  ?05/15/2021 Initial Diagnosis  ? Multiple myeloma not having achieved remission ([place]) ?  ?06/27/2021 -  Chemotherapy  ? Patient is on Treatment Plan : MYELOMA Daratumumab SQ + Lenalidomide + Dexamethasone (DaraRd) q28d  ?   ? ? ?HISTORY OF PRESENTING ILLNESS: pt in a wheelchair; accompanied by her daughter.  ? ?Cristina Morgan 87 y.o.  female with multiple compression fractures of the back, hx of hypercalcemia, diabetes, hypertension, and multiple myeloma, currently on daratumumab, revlimid, and dex, who returns to clinic for consideration of blood transfusion and daratumumab. She continues long acting insulin which she is taking at night. Reports weight loss which she believes is due to being unsure of what she can eat due to her sugars.  ? ?Review of Systems  ?Constitutional:  Positive for malaise/fatigue and weight loss. Negative for chills, diaphoresis and fever.  ?HENT:  Negative for nosebleeds and sore throat.   ?Eyes:  Negative for double vision.  ?Respiratory:  Negative for cough, hemoptysis, sputum production, shortness of breath and wheezing.   ?Cardiovascular:  Negative for chest pain, palpitations, orthopnea and leg swelling.  ?Gastrointestinal:  Negative for abdominal pain, blood in stool, constipation, diarrhea, heartburn, melena, nausea and vomiting.  ?Genitourinary:  Negative for dysuria, frequency and urgency.  ?Musculoskeletal:  Positive for back pain and joint pain.  ?Skin: Negative.  Negative for itching and rash.  ?Neurological:  Negative for dizziness, tingling, focal weakness, weakness and headaches.  ?Endo/Heme/Allergies:  Does not bruise/bleed easily.  ?Psychiatric/Behavioral:  Negative for depression. The patient is not nervous/anxious and does not have insomnia.    ? ?MEDICAL HISTORY:  ?Past Medical History:  ?Diagnosis Date  ? Anemia   ? CAD (coronary artery disease)   ? Cataracts, bilateral   ? Closed compression fracture of first lumbar vertebra ([place])   ? Closed compression fracture of second lumbar vertebra ([place])   ? Diabetes mellitus without complication ([place])   ? High cholesterol   ? History of pneumonia 2010  ? Legionnaires  ? History of uterine fibroid   ? Hypercalcemia   ? Hyperlipidemia   ?  Hypertension   ? Lytic bone lesions on xray   ? Multiple myeloma ([place])   ? Osteoporosis   ? Primary osteoarthritis of  right knee   ? ? ?SURGICAL HISTORY: ?Past Surgical History:  ?Procedure Laterality Date  ? CATARACT EXTRACTION Bilateral 2014  ? COLONOSCOPY  2011  ? cleared for 5 yrs- [facility]  ? ECTOPIC PREGNANCY SURGERY    ? ? ?SOCIAL HISTORY: ?Social History  ? ?Socioeconomic History  ? Marital status: Married  ?  Spouse name: Not on file  ? Number of children: 2  ? Years of education: Not on file  ? Highest education level: 12th grade  ?Occupational History  ? Occupation: Retired  ?Tobacco Use  ? Smoking status: Never  ?  Passive exposure: Never  ? Smokeless tobacco: Never  ? Tobacco comments:  ?  smoking cessation materials not required  ?Vaping Use  ? Vaping Use: Never used  ?Substance and Sexual Activity  ? Alcohol use: No  ? Drug use: No  ? Sexual activity: Not Currently  ?Other Topics Concern  ? Not on file  ?Social History Narrative  ? [place] with husband; drives. Never smoked; no alcohol. Daughters live close.   ? ?Social Determinants of Health  ? ?Financial Resource Strain: Low Risk   ? Difficulty of Paying Living Expenses: Not very hard  ?Food Insecurity: No Food Insecurity  ? Worried About Charity fundraiser in the Last Year: Never true  ? Ran Out of Food in the Last Year: Never true  ?Transportation Needs: No Transportation Needs  ? Lack of Transportation (Medical): No  ? Lack of Transportation (Non-Medical): No  ?Physical Activity: Inactive  ? Days of Exercise per Week: 0 days  ? Minutes of Exercise per Session: 0 min  ?Stress: No Stress Concern Present  ? Feeling of Stress : Only a little  ?Social Connections: Moderately Integrated  ? Frequency of Communication with Friends and Family: Three times a week  ? Frequency of Social Gatherings with Friends and Family: Three times a week  ? Attends Religious Services: 1 to 4 times per year  ? Active Member of Clubs or Organizations: No  ? Attends Archivist Meetings: Never  ? Marital Status: Married  ?Intimate Partner Violence: Not At Risk  ? Fear of  Current or Ex-Partner: No  ? Emotionally Abused: No  ? Physically Abused: No  ? Sexually Abused: No  ? ? ?FAMILY HISTORY: ?Family History  ?Problem Relation Age of Onset  ? Cancer Mother   ?     breast  ? Heart disease Mother   ? Diabetes Father   ? Heart disease Father   ? Heart attack Father   ? Heart attack Brother   ? Diabetes Brother   ? ? ?ALLERGIES:  is allergic to latex and penicillins. ? ?MEDICATIONS:  ?Current Outpatient Medications  ?Medication Sig Dispense Refill  ? ACCU-CHEK GUIDE test strip USE 1 STRIP TO CHECK GLUCOSE ONCE DAILY AS DIRECTED    ? Accu-Chek Softclix Lancets lancets USE 1 TO CHECK GLUCOSE ONCE DAILY    ? acetaminophen (TYLENOL) 325 MG tablet Take 325 mg by mouth 2 (two) times daily. START 2 days prior to chemo infusion. Take For 2 days; Do NOT take on the day of infusion    ? acyclovir (ZOVIRAX) 400 MG tablet Take 1 tablet (400 mg total) by mouth 2 (two) times daily. 60 tablet 4  ? baclofen (LIORESAL) 20 MG tablet Take 1 tablet (20  mg total) by mouth 2 (two) times daily. 30 tablet 0  ? BD PEN NEEDLE NANO 2ND GEN 32G X 4 MM MISC USE 1 ONCE DAILY    ? Blood Glucose Monitoring Suppl (ACCU-CHEK GUIDE) w/Device KIT USE AS DIRECTED TO CHECK GLUCOSE    ? Calcium Carbonate-Vit D-Min (CALCIUM 1200 PO) Take 1 capsule by mouth daily.    ? carvedilol (COREG) 3.125 MG tablet Take 1 tablet (3.125 mg total) by mouth 2 (two) times daily with a meal. 60 tablet 1  ? cholecalciferol (VITAMIN D3) 25 MCG (1000 UNIT) tablet Take 1,000 Units by mouth daily.    ? dexamethasone (DECADRON) 4 MG tablet Take 1 tablet (4 mg total) by mouth 2 (two) times daily. Start 2 days prior to infusion; Take it for 2 days. 60 tablet 3  ? Glucerna (GLUCERNA) LIQD Take 237 mLs by mouth.    ? hydrOXYzine (ATARAX) 10 MG tablet Take 1 tablet (10 mg total) by mouth at bedtime as needed. 90 tablet 1  ? insulin glargine (LANTUS SOLOSTAR) 100 UNIT/ML Solostar Pen Inject 5 Units into the skin daily. (Patient taking differently: Inject 6  Units into the skin daily.) 15 mL PRN  ? lenalidomide (REVLIMID) 15 MG capsule Take 1 capsule (15 mg total) by mouth daily. Take for 21 days, then hold for 7 days. Repeat every 28 days. 21 capsule 0  ? losart

## 2021-07-04 NOTE — Progress Notes (Signed)
Patient tolerated Dara SQ injection today, no concerns voiced. Patient also received 1 L NS. Stable. AVS given. Patient pushed in room to meet with nutritionist.  ?

## 2021-07-05 DIAGNOSIS — M8448XD Pathological fracture, other site, subsequent encounter for fracture with routine healing: Secondary | ICD-10-CM | POA: Diagnosis not present

## 2021-07-05 DIAGNOSIS — I10 Essential (primary) hypertension: Secondary | ICD-10-CM | POA: Diagnosis not present

## 2021-07-05 DIAGNOSIS — E119 Type 2 diabetes mellitus without complications: Secondary | ICD-10-CM | POA: Diagnosis not present

## 2021-07-05 DIAGNOSIS — E7800 Pure hypercholesterolemia, unspecified: Secondary | ICD-10-CM | POA: Diagnosis not present

## 2021-07-05 DIAGNOSIS — E871 Hypo-osmolality and hyponatremia: Secondary | ICD-10-CM | POA: Diagnosis not present

## 2021-07-08 ENCOUNTER — Telehealth: Payer: Self-pay

## 2021-07-08 ENCOUNTER — Encounter: Payer: Self-pay | Admitting: Internal Medicine

## 2021-07-08 ENCOUNTER — Telehealth: Payer: Self-pay | Admitting: Family Medicine

## 2021-07-08 DIAGNOSIS — C9000 Multiple myeloma not having achieved remission: Secondary | ICD-10-CM | POA: Diagnosis not present

## 2021-07-08 DIAGNOSIS — Z7984 Long term (current) use of oral hypoglycemic drugs: Secondary | ICD-10-CM | POA: Diagnosis not present

## 2021-07-08 DIAGNOSIS — E7800 Pure hypercholesterolemia, unspecified: Secondary | ICD-10-CM | POA: Diagnosis not present

## 2021-07-08 DIAGNOSIS — E538 Deficiency of other specified B group vitamins: Secondary | ICD-10-CM | POA: Diagnosis not present

## 2021-07-08 DIAGNOSIS — Z9181 History of falling: Secondary | ICD-10-CM | POA: Diagnosis not present

## 2021-07-08 DIAGNOSIS — E119 Type 2 diabetes mellitus without complications: Secondary | ICD-10-CM | POA: Diagnosis not present

## 2021-07-08 DIAGNOSIS — E871 Hypo-osmolality and hyponatremia: Secondary | ICD-10-CM | POA: Diagnosis not present

## 2021-07-08 DIAGNOSIS — F419 Anxiety disorder, unspecified: Secondary | ICD-10-CM | POA: Diagnosis not present

## 2021-07-08 DIAGNOSIS — M8458XD Pathological fracture in neoplastic disease, other specified site, subsequent encounter for fracture with routine healing: Secondary | ICD-10-CM | POA: Diagnosis not present

## 2021-07-08 DIAGNOSIS — Z794 Long term (current) use of insulin: Secondary | ICD-10-CM | POA: Diagnosis not present

## 2021-07-08 DIAGNOSIS — I2510 Atherosclerotic heart disease of native coronary artery without angina pectoris: Secondary | ICD-10-CM | POA: Diagnosis not present

## 2021-07-08 DIAGNOSIS — Z7983 Long term (current) use of bisphosphonates: Secondary | ICD-10-CM | POA: Diagnosis not present

## 2021-07-08 DIAGNOSIS — I10 Essential (primary) hypertension: Secondary | ICD-10-CM | POA: Diagnosis not present

## 2021-07-08 DIAGNOSIS — Z791 Long term (current) use of non-steroidal anti-inflammatories (NSAID): Secondary | ICD-10-CM | POA: Diagnosis not present

## 2021-07-08 DIAGNOSIS — Z7982 Long term (current) use of aspirin: Secondary | ICD-10-CM | POA: Diagnosis not present

## 2021-07-08 NOTE — Telephone Encounter (Signed)
Cyndi calling from [facility] is calling to continue to [facility].  ?Frequency 1 w 9 ?CB- [phone] ?

## 2021-07-08 NOTE — Telephone Encounter (Signed)
I called Shelanda/ daughter and left message asking her to return our call with BS readings for patient. ?

## 2021-07-08 NOTE — Telephone Encounter (Signed)
Returned call to [employer]- had to leave message on machine. Left message to proceed with 1 x week for 9 weeks with [facility] PT/ [facility]. [phone] ?

## 2021-07-09 ENCOUNTER — Encounter: Payer: Self-pay | Admitting: Family Medicine

## 2021-07-09 DIAGNOSIS — E871 Hypo-osmolality and hyponatremia: Secondary | ICD-10-CM | POA: Diagnosis not present

## 2021-07-09 DIAGNOSIS — I10 Essential (primary) hypertension: Secondary | ICD-10-CM | POA: Diagnosis not present

## 2021-07-09 DIAGNOSIS — M8458XD Pathological fracture in neoplastic disease, other specified site, subsequent encounter for fracture with routine healing: Secondary | ICD-10-CM | POA: Diagnosis not present

## 2021-07-09 DIAGNOSIS — C9000 Multiple myeloma not having achieved remission: Secondary | ICD-10-CM | POA: Diagnosis not present

## 2021-07-09 DIAGNOSIS — I2510 Atherosclerotic heart disease of native coronary artery without angina pectoris: Secondary | ICD-10-CM | POA: Diagnosis not present

## 2021-07-11 ENCOUNTER — Inpatient Hospital Stay: Payer: Medicare Other

## 2021-07-11 ENCOUNTER — Inpatient Hospital Stay (HOSPITAL_BASED_OUTPATIENT_CLINIC_OR_DEPARTMENT_OTHER): Payer: Medicare Other | Admitting: Internal Medicine

## 2021-07-11 DIAGNOSIS — R630 Anorexia: Secondary | ICD-10-CM | POA: Diagnosis not present

## 2021-07-11 DIAGNOSIS — D649 Anemia, unspecified: Secondary | ICD-10-CM | POA: Diagnosis not present

## 2021-07-11 DIAGNOSIS — C9000 Multiple myeloma not having achieved remission: Secondary | ICD-10-CM | POA: Insufficient documentation

## 2021-07-11 DIAGNOSIS — E1136 Type 2 diabetes mellitus with diabetic cataract: Secondary | ICD-10-CM | POA: Diagnosis not present

## 2021-07-11 DIAGNOSIS — Z5112 Encounter for antineoplastic immunotherapy: Secondary | ICD-10-CM | POA: Insufficient documentation

## 2021-07-11 DIAGNOSIS — I10 Essential (primary) hypertension: Secondary | ICD-10-CM | POA: Diagnosis not present

## 2021-07-11 LAB — COMPREHENSIVE METABOLIC PANEL
ALT: 16 U/L (ref 0–44)
AST: 18 U/L (ref 15–41)
Albumin: 3.8 g/dL (ref 3.5–5.0)
Alkaline Phosphatase: 164 U/L — ABNORMAL HIGH (ref 38–126)
Anion gap: 7 (ref 5–15)
BUN: 47 mg/dL — ABNORMAL HIGH (ref 8–23)
CO2: 21 mmol/L — ABNORMAL LOW (ref 22–32)
Calcium: 9.4 mg/dL (ref 8.9–10.3)
Chloride: 103 mmol/L (ref 98–111)
Creatinine, Ser: 1.22 mg/dL — ABNORMAL HIGH (ref 0.44–1.00)
GFR, Estimated: 43 mL/min — ABNORMAL LOW (ref >60)
Glucose, Bld: 226 mg/dL — ABNORMAL HIGH (ref 70–99)
Potassium: 5.1 mmol/L (ref 3.5–5.1)
Sodium: 131 mmol/L — ABNORMAL LOW (ref 135–145)
Total Bilirubin: 0.5 mg/dL (ref 0.3–1.2)
Total Protein: 7 g/dL (ref 6.5–8.1)

## 2021-07-11 LAB — CBC WITH DIFFERENTIAL/PLATELET
Abs Immature Granulocytes: 0.01 10*3/uL (ref 0.00–0.07)
Basophils Absolute: 0 10*3/uL (ref 0.0–0.1)
Basophils Relative: 1 %
Eosinophils Absolute: 0 10*3/uL (ref 0.0–0.5)
Eosinophils Relative: 0 %
HCT: 33 % — ABNORMAL LOW (ref 36.0–46.0)
Hemoglobin: 10.3 g/dL — ABNORMAL LOW (ref 12.0–15.0)
Immature Granulocytes: 0 %
Lymphocytes Relative: 23 %
Lymphs Abs: 0.8 10*3/uL (ref 0.7–4.0)
MCH: 30.1 pg (ref 26.0–34.0)
MCHC: 31.2 g/dL (ref 30.0–36.0)
MCV: 96.5 fL (ref 80.0–100.0)
Monocytes Absolute: 0.2 10*3/uL (ref 0.1–1.0)
Monocytes Relative: 5 %
Neutro Abs: 2.5 10*3/uL (ref 1.7–7.7)
Neutrophils Relative %: 71 %
Platelets: 289 10*3/uL (ref 150–400)
RBC: 3.42 MIL/uL — ABNORMAL LOW (ref 3.87–5.11)
RDW: 15.1 % (ref 11.5–15.5)
WBC: 3.6 10*3/uL — ABNORMAL LOW (ref 4.0–10.5)
nRBC: 0 % (ref 0.0–0.2)

## 2021-07-11 MED ORDER — ONDANSETRON HCL 4 MG PO TABS: 4.0000 mg | ORAL_TABLET | Freq: Four times a day (QID) | ORAL | 1 refills | Status: DC | PRN

## 2021-07-11 MED ORDER — DIPHENHYDRAMINE HCL 25 MG PO CAPS
50.0000 mg | ORAL_CAPSULE | Freq: Once | ORAL | Status: AC
  Administered 2021-07-11: 50 mg via ORAL
  Filled 2021-07-11: qty 2

## 2021-07-11 MED ORDER — ACETAMINOPHEN 325 MG PO TABS
650.0000 mg | ORAL_TABLET | Freq: Once | ORAL | Status: AC
  Administered 2021-07-11: 650 mg via ORAL
  Filled 2021-07-11: qty 2

## 2021-07-11 MED ORDER — DEXAMETHASONE 4 MG PO TABS
20.0000 mg | ORAL_TABLET | Freq: Once | ORAL | Status: AC
  Administered 2021-07-11: 20 mg via ORAL
  Filled 2021-07-11: qty 5

## 2021-07-11 MED ORDER — DIPHENOXYLATE-ATROPINE 2.5-0.025 MG PO TABS: 1.0000 | ORAL_TABLET | Freq: Four times a day (QID) | ORAL | 0 refills | Status: DC | PRN

## 2021-07-11 MED ORDER — MONTELUKAST SODIUM 10 MG PO TABS
10.0000 mg | ORAL_TABLET | Freq: Once | ORAL | Status: AC
  Administered 2021-07-11: 10 mg via ORAL
  Filled 2021-07-11: qty 1

## 2021-07-11 MED ORDER — DARATUMUMAB-HYALURONIDASE-FIHJ 1800-30000 MG-UT/15ML ~~LOC~~ SOLN
1800.0000 mg | Freq: Once | SUBCUTANEOUS | Status: AC
  Administered 2021-07-11: 1800 mg via SUBCUTANEOUS
  Filled 2021-07-11: qty 15

## 2021-07-11 NOTE — Patient Instructions (Signed)
[facility]  Discharge Instructions: ?Thank you for choosing [facility] to provide your oncology and hematology care.  ?If you have a lab appointment with the Cancer Center, please go directly to the Cancer Center and check in at the registration area. ? ?Wear comfortable clothing and clothing appropriate for easy access to any Portacath or PICC line.  ? ?We strive to give you quality time with your provider. You may need to reschedule your appointment if you arrive late (15 or more minutes).  Arriving late affects you and other patients whose appointments are after yours.  Also, if you miss three or more appointments without notifying the office, you may be dismissed from the clinic at the provider?s discretion.    ?  ?For prescription refill requests, have your pharmacy contact our office and allow 72 hours for refills to be completed.   ? ?  ?To help prevent nausea and vomiting after your treatment, we encourage you to take your nausea medication as directed. ? ?BELOW ARE SYMPTOMS THAT SHOULD BE REPORTED IMMEDIATELY: ?*FEVER GREATER THAN 100.4 F (38 ?C) OR HIGHER ?*CHILLS OR SWEATING ?*NAUSEA AND VOMITING THAT IS NOT CONTROLLED WITH YOUR NAUSEA MEDICATION ?*UNUSUAL SHORTNESS OF BREATH ?*UNUSUAL BRUISING OR BLEEDING ?*URINARY PROBLEMS (pain or burning when urinating, or frequent urination) ?*BOWEL PROBLEMS (unusual diarrhea, constipation, pain near the anus) ?TENDERNESS IN MOUTH AND THROAT WITH OR WITHOUT PRESENCE OF ULCERS (sore throat, sores in mouth, or a toothache) ?UNUSUAL RASH, SWELLING OR PAIN  ?UNUSUAL VAGINAL DISCHARGE OR ITCHING  ? ?Items with * indicate a potential emergency and should be followed up as soon as possible or go to the Emergency Department if any problems should occur. ? ?Please show the CHEMOTHERAPY ALERT CARD or IMMUNOTHERAPY ALERT CARD at check-in to the Emergency Department and triage nurse. ? ?Should you have questions after your visit  or need to cancel or reschedule your appointment, please contact [facility]  [phone] and follow the prompts.  Office hours are 8:00 a.m. to 4:30 p.m. Monday - Friday. Please note that voicemails left after 4:00 p.m. may not be returned until the following business day.  We are closed weekends and major holidays. You have access to a nurse at all times for urgent questions. Please call the main number to the clinic [phone] and follow the prompts. ? ?For any non-urgent questions, you may also contact your provider using MyChart. We now offer e-Visits for anyone 18 and older to request care online for non-urgent symptoms. For details visit [email or web address]. ?  ?Also download the MyChart app! Go to the app store, search "MyChart", open the app, select , and log in with your MyChart username and password. ? ?Due to Covid, a mask is required upon entering the hospital/clinic. If you do not have a mask, one will be given to you upon arrival. For doctor visits, patients may have 1 support person aged 18 or older with them. For treatment visits, patients cannot have anyone with them due to current Covid guidelines and our immunocompromised population.  ?

## 2021-07-11 NOTE — Patient Instructions (Signed)
STOP revlimid until next week ? ?STOP cozaar until next week.  ?

## 2021-07-11 NOTE — Assessment & Plan Note (Addendum)
#  STAGE I- MULTIPLE MYELOMA -Active [bone lesions; hypercalcemia; anemia; No renal insuffiencey;Bone marrow-32% plasma cell-   STANDARD Cytogenetics].  Currently on Revlimid-Dex- Dara SQ. Partial response noted; April 2023-M protein = 0.6 gm/fl; K/L= 1.85.  ? ?#Proceed with Dara SQ today; Labs today reviewed;  acceptable for treatment today. But HOLD revlimid until next visit-see below.  ? ?# Diarrhea- 10 loose BM/yesterday- ? Revlimid. STOP revlimid; recommend immoidum after each BM up to 4 a day. Recommend lomotil.  ? ?# Diabetes-on oral hypoglycemic agents; BG-228  on longacting insulin; 14 units of lantus on T/W/Thursday. discussed with Dr.Jones.  ? ?# HTN: mild hypotensive sec to diarrhea; HOLD losartan; continue coreg; check BP at home; ?  re-start at next visit.  ? ?#Hypercalcemia secondary to multiple myeloma- [FEB 2023-Zometa; calcitonin]-calcium today is 9.26  HOLD  Zometa 3 mg possibly in 2 weeks.  ? ?#DVT/shingles prophylaxis: Aspirin/acyclovir.  ? ?# IV access:  ? [name]- [phone] ? ?# DISPOSITION:  ?# proceed with  dara SQ today; HOLD  Zometa today ? ?# follow up in 1 weeks- NP: labs- ; cbc/cmp; Dara SQ ? ?# follow up in 2 weeks- MD; labs- cbc/cmp; Dara SQ; Zometa -Dr.B ? ? ? ?

## 2021-07-11 NOTE — Progress Notes (Signed)
[facility] ?CONSULT NOTE ? ?Patient Care Team: ?Cristina Patch, MD as PCP - General (Family Medicine) ?Cristina Morgan, [place] (Inactive) (Pharmacist) ?Cristina Sickle, MD as Consulting Physician (Oncology) ? ?CHIEF COMPLAINTS/PURPOSE OF CONSULTATION: Multiple myeloma ? ?Oncology History Overview Note  ?# MULTIPLE MYELOMA  [FEB 2023-hypercalcemia status changes]-Active [bone lesions; hypercalcemia; anemia; No renal insuffiencey;Bone marrow-32% plasma cell-   STANDARD Cytogenetics].FEB 2023- S/p dexamethasone 20 mg q day x4. ? ? ?# FEB 2023-hypercalcemia [[facility]-status post calcitonin; bisphosphonate] ? ?BONE MARROW, ASPIRATE, CLOT, CORE:  ?-Hypercellular bone marrow with plasma cell neoplasm  ?-See comment  ? ?PERIPHERAL BLOOD:  ?-Normocytic-normochromic anemia  ? ?COMMENT:  ? ?The bone marrow is hypercellular for age with increased number of  ?atypical plasma cells representing 32% of all cells in the aspirate  ?associated with interstitial infiltrates and numerous variably sized  ?clusters in the clot/biopsy sections.  The plasma cells display weak  ?kappa light chain restriction consistent with plasma cell neoplasm.  ?Correlation with cytogenetic and FISH studies is recommended  ? ?MICROSCOPIC DESCRIPTION:  ? ?PERIPHERAL BLOOD SMEAR: The red blood cells display mild  ?anisopoikilocytosis with mild polychromasia.  The white blood cells are  ?normal number with scattered hypogranular neutrophils. An occasional  ?myelocyte and large atypical mononuclear cells are seen on scan. The  ?platelets are normal in number.  ?  ?Multiple myeloma not having achieved remission ([place])  ?05/15/2021 Initial Diagnosis  ? Multiple myeloma not having achieved remission ([employer]) ?  ?06/27/2021 -  Chemotherapy  ? Patient is on Treatment Plan : MYELOMA Daratumumab SQ + Lenalidomide + Dexamethasone (DaraRd) q28d  ? ?  ?  ?07/11/2021 Cancer Staging  ? Staging form: Plasma Cell Myeloma and Plasma Cell Disorders, AJCC 8th  Edition ?- Clinical: Beta-2-microglobulin (mg/L): 2.6, Albumin (g/dL): 3.9, ISS: Stage I - Signed by Cristina Sickle, MD on 07/11/2021 ?Stage prefix: Initial diagnosis ?Beta 2 microglobulin range (mg/L): Less than 3.5 ?Albumin range (g/dL): Greater than or equal to 3.5 ? ?  ? ? ?HISTORY OF PRESENTING ILLNESS: pt in a wheel chair; accompanied by her daughter.  ? ?Cristina Morgan 87 y.o.  female with  multiple myeloma currently on Rev-Dex is proceed with Dara SQ today. ? ?Patient started Revlimid approximately 5 days ago.  Noted to have diarrhea multiple loose stools up to 10 yesterday.  Patient did not take any Imodium.  Patient feels tired.  Feels poorly.  Poor appetite. ? ?The daughter states the blood sugars-are better controlled; on increased dose of insulin./Long-acting. ? ?Patient states her back pain is improved.  Denies any worsening joint pains.  No falls.  No further episodes of mental status changes.  ? ?Patient is currently at home.  Patient continues to be fatigued.   ? ?Review of Systems  ?Constitutional:  Positive for malaise/fatigue and weight loss. Negative for chills, diaphoresis and fever.  ?HENT:  Negative for nosebleeds and sore throat.   ?Eyes:  Negative for double vision.  ?Respiratory:  Negative for cough, hemoptysis, sputum production, shortness of breath and wheezing.   ?Cardiovascular:  Negative for chest pain, palpitations, orthopnea and leg swelling.  ?Gastrointestinal:  Negative for abdominal pain, blood in stool, constipation, diarrhea, heartburn, melena, nausea and vomiting.  ?Genitourinary:  Negative for dysuria, frequency and urgency.  ?Musculoskeletal:  Positive for back pain and joint pain.  ?Skin: Negative.  Negative for itching and rash.  ?Neurological:  Negative for dizziness, tingling, focal weakness, weakness and headaches.  ?Endo/Heme/Allergies:  Does not bruise/bleed easily.  ?  Psychiatric/Behavioral:  Negative for depression. The patient is not nervous/anxious and does  not have insomnia.    ? ?MEDICAL HISTORY:  ?Past Medical History:  ?Diagnosis Date  ? Anemia   ? CAD (coronary artery disease)   ? Cataracts, bilateral   ? Closed compression fracture of first lumbar vertebra ([place])   ? Closed compression fracture of second lumbar vertebra ([place])   ? Diabetes mellitus without complication ([place])   ? High cholesterol   ? History of pneumonia 2010  ? Legionnaires  ? History of uterine fibroid   ? Hypercalcemia   ? Hyperlipidemia   ? Hypertension   ? Lytic bone lesions on xray   ? Multiple myeloma ([place])   ? Osteoporosis   ? Primary osteoarthritis of right knee   ? ? ?SURGICAL HISTORY: ?Past Surgical History:  ?Procedure Laterality Date  ? CATARACT EXTRACTION Bilateral 2014  ? COLONOSCOPY  2011  ? cleared for 5 yrs- [facility]  ? ECTOPIC PREGNANCY SURGERY    ? ? ?SOCIAL HISTORY: ?Social History  ? ?Socioeconomic History  ? Marital status: Married  ?  Spouse name: Not on file  ? Number of children: 2  ? Years of education: Not on file  ? Highest education level: 12th grade  ?Occupational History  ? Occupation: Retired  ?Tobacco Use  ? Smoking status: Never  ?  Passive exposure: Never  ? Smokeless tobacco: Never  ? Tobacco comments:  ?  smoking cessation materials not required  ?Vaping Use  ? Vaping Use: Never used  ?Substance and Sexual Activity  ? Alcohol use: No  ? Drug use: No  ? Sexual activity: Not Currently  ?Other Topics Concern  ? Not on file  ?Social History Narrative  ? [place] with husband; drives. Never smoked; no alcohol. Daughters live close.   ? ?Social Determinants of Health  ? ?Financial Resource Strain: Low Risk   ? Difficulty of Paying Living Expenses: Not very hard  ?Food Insecurity: No Food Insecurity  ? Worried About Charity fundraiser in the Last Year: Never true  ? Ran Out of Food in the Last Year: Never true  ?Transportation Needs: No Transportation Needs  ? Lack of Transportation (Medical): No  ? Lack of Transportation (Non-Medical): No  ?Physical Activity:  Inactive  ? Days of Exercise per Week: 0 days  ? Minutes of Exercise per Session: 0 min  ?Stress: No Stress Concern Present  ? Feeling of Stress : Only a little  ?Social Connections: Moderately Integrated  ? Frequency of Communication with Friends and Family: Three times a week  ? Frequency of Social Gatherings with Friends and Family: Three times a week  ? Attends Religious Services: 1 to 4 times per year  ? Active Member of Clubs or Organizations: No  ? Attends Archivist Meetings: Never  ? Marital Status: Married  ?Intimate Partner Violence: Not At Risk  ? Fear of Current or Ex-Partner: No  ? Emotionally Abused: No  ? Physically Abused: No  ? Sexually Abused: No  ? ? ?FAMILY HISTORY: ?Family History  ?Problem Relation Age of Onset  ? Cancer Mother   ?     breast  ? Heart disease Mother   ? Diabetes Father   ? Heart disease Father   ? Heart attack Father   ? Heart attack Brother   ? Diabetes Brother   ? ? ?ALLERGIES:  is allergic to latex and penicillins. ? ?MEDICATIONS:  ?Current Outpatient Medications  ?Medication Sig  Dispense Refill  ? ACCU-CHEK GUIDE test strip USE 1 STRIP TO CHECK GLUCOSE ONCE DAILY AS DIRECTED    ? Accu-Chek Softclix Lancets lancets USE 1 TO CHECK GLUCOSE ONCE DAILY    ? acetaminophen (TYLENOL) 325 MG tablet Take 325 mg by mouth 2 (two) times daily. START 2 days prior to chemo infusion. Take For 2 days; Do NOT take on the day of infusion    ? acyclovir (ZOVIRAX) 400 MG tablet Take 1 tablet (400 mg total) by mouth 2 (two) times daily. 60 tablet 4  ? BD PEN NEEDLE NANO 2ND GEN 32G X 4 MM MISC USE 1 ONCE DAILY    ? Blood Glucose Monitoring Suppl (ACCU-CHEK GUIDE) w/Device KIT USE AS DIRECTED TO CHECK GLUCOSE    ? carvedilol (COREG) 3.125 MG tablet Take 1 tablet (3.125 mg total) by mouth 2 (two) times daily with a meal. 60 tablet 1  ? dexamethasone (DECADRON) 4 MG tablet Take 1 tablet (4 mg total) by mouth 2 (two) times daily. Start 2 days prior to infusion; Take it for 2 days. 60  tablet 3  ? diphenoxylate-atropine (LOMOTIL) 2.5-0.025 MG tablet Take 1 tablet by mouth 4 (four) times daily as needed for diarrhea or loose stools. Take it along with immodium 60 tablet 0  ? Glucerna ([place]) LIQD T

## 2021-07-11 NOTE — Progress Notes (Signed)
Patient reports having more bad days than good days this week.  On her bad days she has not appetite and if she does eat she has diarrhea.  Last diarrhea episode was last night.  Did eat some soup and smoothie this morning with no diarrhea.  PCP, Dr. Ronnald Ramp, is adjusting her insulin dosing and family think she is not tolerating the dosing.    ? ?Also having nausea that is relieved with Zofran, refill pended for MD review.   ? ?Blood pressure in office check today is 100/44 on first check 102/50 on 2nd check, HR 75.  Takes blood pressure medication in the evening.   ?

## 2021-07-12 ENCOUNTER — Telehealth: Payer: Self-pay | Admitting: *Deleted

## 2021-07-12 LAB — KAPPA/LAMBDA LIGHT CHAINS
Kappa free light chain: 23.3 mg/L — ABNORMAL HIGH (ref 3.3–19.4)
Kappa, lambda light chain ratio: 1.57 (ref 0.26–1.65)
Lambda free light chains: 14.8 mg/L (ref 5.7–26.3)

## 2021-07-12 NOTE — Telephone Encounter (Signed)
Patient daughter called reporting that patient has had indigestion since before she left office yesterday. She is asking for something to be done ?

## 2021-07-12 NOTE — Telephone Encounter (Signed)
Call returned and spoke with granddaughter who states that patient is taking her Protonix daily, but patient will not eat due to indigestion. Advised per NP to increase Protonix to twice a day dosing for 1 - 2 weeks. She repeated this back and asked what she can take immediately I advised that she can take Mylanta, Maalox or Tums . ?

## 2021-07-15 LAB — MULTIPLE MYELOMA PANEL, SERUM
Albumin SerPl Elph-Mcnc: 3.4 g/dL (ref 2.9–4.4)
Albumin/Glob SerPl: 1.3 (ref 0.7–1.7)
Alpha 1: 0.2 g/dL (ref 0.0–0.4)
Alpha2 Glob SerPl Elph-Mcnc: 0.8 g/dL (ref 0.4–1.0)
B-Globulin SerPl Elph-Mcnc: 1 g/dL (ref 0.7–1.3)
Gamma Glob SerPl Elph-Mcnc: 0.9 g/dL (ref 0.4–1.8)
Globulin, Total: 2.8 g/dL (ref 2.2–3.9)
IgA: 86 mg/dL (ref 64–422)
IgG (Immunoglobin G), Serum: 994 mg/dL (ref 586–1602)
IgM (Immunoglobulin M), Srm: 37 mg/dL (ref 26–217)
M Protein SerPl Elph-Mcnc: 0.5 g/dL — ABNORMAL HIGH
Total Protein ELP: 6.2 g/dL (ref 6.0–8.5)

## 2021-07-16 DIAGNOSIS — M8458XD Pathological fracture in neoplastic disease, other specified site, subsequent encounter for fracture with routine healing: Secondary | ICD-10-CM | POA: Diagnosis not present

## 2021-07-16 DIAGNOSIS — I10 Essential (primary) hypertension: Secondary | ICD-10-CM | POA: Diagnosis not present

## 2021-07-16 DIAGNOSIS — Z20822 Contact with and (suspected) exposure to covid-19: Secondary | ICD-10-CM | POA: Diagnosis not present

## 2021-07-16 DIAGNOSIS — E871 Hypo-osmolality and hyponatremia: Secondary | ICD-10-CM | POA: Diagnosis not present

## 2021-07-16 DIAGNOSIS — I2510 Atherosclerotic heart disease of native coronary artery without angina pectoris: Secondary | ICD-10-CM | POA: Diagnosis not present

## 2021-07-16 DIAGNOSIS — C9000 Multiple myeloma not having achieved remission: Secondary | ICD-10-CM | POA: Diagnosis not present

## 2021-07-18 ENCOUNTER — Inpatient Hospital Stay: Payer: Medicare Other

## 2021-07-18 ENCOUNTER — Inpatient Hospital Stay (HOSPITAL_BASED_OUTPATIENT_CLINIC_OR_DEPARTMENT_OTHER): Payer: Medicare Other | Admitting: Medical Oncology

## 2021-07-18 ENCOUNTER — Inpatient Hospital Stay: Payer: Medicare Other | Attending: Nurse Practitioner

## 2021-07-18 ENCOUNTER — Encounter: Payer: Self-pay | Admitting: Medical Oncology

## 2021-07-18 VITALS — BP 127/57 | HR 64 | Temp 98.0°F | Resp 16 | Wt 118.3 lb

## 2021-07-18 DIAGNOSIS — C9000 Multiple myeloma not having achieved remission: Secondary | ICD-10-CM

## 2021-07-18 DIAGNOSIS — D649 Anemia, unspecified: Secondary | ICD-10-CM | POA: Diagnosis not present

## 2021-07-18 DIAGNOSIS — R197 Diarrhea, unspecified: Secondary | ICD-10-CM | POA: Diagnosis not present

## 2021-07-18 DIAGNOSIS — E119 Type 2 diabetes mellitus without complications: Secondary | ICD-10-CM | POA: Insufficient documentation

## 2021-07-18 DIAGNOSIS — Z5112 Encounter for antineoplastic immunotherapy: Secondary | ICD-10-CM | POA: Diagnosis not present

## 2021-07-18 DIAGNOSIS — Z20822 Contact with and (suspected) exposure to covid-19: Secondary | ICD-10-CM | POA: Diagnosis not present

## 2021-07-18 DIAGNOSIS — I10 Essential (primary) hypertension: Secondary | ICD-10-CM | POA: Insufficient documentation

## 2021-07-18 DIAGNOSIS — I959 Hypotension, unspecified: Secondary | ICD-10-CM | POA: Diagnosis not present

## 2021-07-18 LAB — CBC WITH DIFFERENTIAL/PLATELET
Abs Immature Granulocytes: 0.01 10*3/uL (ref 0.00–0.07)
Basophils Absolute: 0 10*3/uL (ref 0.0–0.1)
Basophils Relative: 1 %
Eosinophils Absolute: 0 10*3/uL (ref 0.0–0.5)
Eosinophils Relative: 0 %
HCT: 29.6 % — ABNORMAL LOW (ref 36.0–46.0)
Hemoglobin: 9.7 g/dL — ABNORMAL LOW (ref 12.0–15.0)
Immature Granulocytes: 0 %
Lymphocytes Relative: 42 %
Lymphs Abs: 2.4 10*3/uL (ref 0.7–4.0)
MCH: 31 pg (ref 26.0–34.0)
MCHC: 32.8 g/dL (ref 30.0–36.0)
MCV: 94.6 fL (ref 80.0–100.0)
Monocytes Absolute: 0.7 10*3/uL (ref 0.1–1.0)
Monocytes Relative: 13 %
Neutro Abs: 2.5 10*3/uL (ref 1.7–7.7)
Neutrophils Relative %: 44 %
Platelets: 277 10*3/uL (ref 150–400)
RBC: 3.13 MIL/uL — ABNORMAL LOW (ref 3.87–5.11)
RDW: 15.1 % (ref 11.5–15.5)
WBC: 5.7 10*3/uL (ref 4.0–10.5)
nRBC: 0 % (ref 0.0–0.2)

## 2021-07-18 LAB — COMPREHENSIVE METABOLIC PANEL
ALT: 14 U/L (ref 0–44)
AST: 21 U/L (ref 15–41)
Albumin: 3.6 g/dL (ref 3.5–5.0)
Alkaline Phosphatase: 147 U/L — ABNORMAL HIGH (ref 38–126)
Anion gap: 4 — ABNORMAL LOW (ref 5–15)
BUN: 32 mg/dL — ABNORMAL HIGH (ref 8–23)
CO2: 23 mmol/L (ref 22–32)
Calcium: 9.4 mg/dL (ref 8.9–10.3)
Chloride: 103 mmol/L (ref 98–111)
Creatinine, Ser: 1.17 mg/dL — ABNORMAL HIGH (ref 0.44–1.00)
GFR, Estimated: 46 mL/min — ABNORMAL LOW (ref >60)
Glucose, Bld: 137 mg/dL — ABNORMAL HIGH (ref 70–99)
Potassium: 4 mmol/L (ref 3.5–5.1)
Sodium: 130 mmol/L — ABNORMAL LOW (ref 135–145)
Total Bilirubin: 0.7 mg/dL (ref 0.3–1.2)
Total Protein: 6.6 g/dL (ref 6.5–8.1)

## 2021-07-18 MED ORDER — DEXAMETHASONE 4 MG PO TABS
20.0000 mg | ORAL_TABLET | Freq: Once | ORAL | Status: AC
  Administered 2021-07-18: 20 mg via ORAL
  Filled 2021-07-18: qty 5

## 2021-07-18 MED ORDER — DIPHENHYDRAMINE HCL 25 MG PO CAPS
50.0000 mg | ORAL_CAPSULE | Freq: Once | ORAL | Status: AC
  Administered 2021-07-18: 50 mg via ORAL
  Filled 2021-07-18: qty 2

## 2021-07-18 MED ORDER — ACETAMINOPHEN 325 MG PO TABS
650.0000 mg | ORAL_TABLET | Freq: Once | ORAL | Status: AC
  Administered 2021-07-18: 650 mg via ORAL
  Filled 2021-07-18: qty 2

## 2021-07-18 MED ORDER — DARATUMUMAB-HYALURONIDASE-FIHJ 1800-30000 MG-UT/15ML ~~LOC~~ SOLN
1800.0000 mg | Freq: Once | SUBCUTANEOUS | Status: AC
  Administered 2021-07-18: 1800 mg via SUBCUTANEOUS
  Filled 2021-07-18: qty 15

## 2021-07-18 NOTE — Progress Notes (Signed)
Daughter states pt has been having lots of heartburn, has been miserable for the past week not sure if dosage needs to be increased. As well as, pt is not sleeping well at all and daughter is wondering can hydroxzine dosage be increased as well.  ?

## 2021-07-18 NOTE — Progress Notes (Signed)
[facility] ?CONSULT NOTE ? ?Patient Care Team: ?Juline Patch, MD as PCP - General (Family Medicine) ?Vladimir Faster, [place] (Inactive) (Pharmacist) ?Cammie Sickle, MD as Consulting Physician (Oncology) ? ?CHIEF COMPLAINTS/PURPOSE OF CONSULTATION: Multiple myeloma ? ?Oncology History Overview Note  ?# MULTIPLE MYELOMA  [FEB 2023-hypercalcemia status changes]-Active [bone lesions; hypercalcemia; anemia; No renal insuffiencey;Bone marrow-32% plasma cell-   STANDARD Cytogenetics].FEB 2023- S/p dexamethasone 20 mg q day x4. ? ? ?# FEB 2023-hypercalcemia [[facility]-status post calcitonin; bisphosphonate] ? ?BONE MARROW, ASPIRATE, CLOT, CORE:  ?-Hypercellular bone marrow with plasma cell neoplasm  ?-See comment  ? ?PERIPHERAL BLOOD:  ?-Normocytic-normochromic anemia  ? ?COMMENT:  ? ?The bone marrow is hypercellular for age with increased number of  ?atypical plasma cells representing 32% of all cells in the aspirate  ?associated with interstitial infiltrates and numerous variably sized  ?clusters in the clot/biopsy sections.  The plasma cells display weak  ?kappa light chain restriction consistent with plasma cell neoplasm.  ?Correlation with cytogenetic and FISH studies is recommended  ? ?MICROSCOPIC DESCRIPTION:  ? ?PERIPHERAL BLOOD SMEAR: The red blood cells display mild  ?anisopoikilocytosis with mild polychromasia.  The white blood cells are  ?normal number with scattered hypogranular neutrophils. An occasional  ?myelocyte and large atypical mononuclear cells are seen on scan. The  ?platelets are normal in number.  ?  ?Multiple myeloma not having achieved remission ([place])  ?05/15/2021 Initial Diagnosis  ? Multiple myeloma not having achieved remission ([place]) ?  ?06/27/2021 -  Chemotherapy  ? Patient is on Treatment Plan : MYELOMA Daratumumab SQ + Lenalidomide + Dexamethasone (DaraRd) q28d  ? ?  ?  ?07/11/2021 Cancer Staging  ? Staging form: Plasma Cell Myeloma and Plasma Cell Disorders, AJCC 8th  Edition ?- Clinical: Beta-2-microglobulin (mg/L): 2.6, Albumin (g/dL): 3.9, ISS: Stage I - Signed by Cammie Sickle, MD on 07/11/2021 ?Stage prefix: Initial diagnosis ?Beta 2 microglobulin range (mg/L): Less than 3.5 ?Albumin range (g/dL): Greater than or equal to 3.5 ? ?  ? ? ?HISTORY OF PRESENTING ILLNESS: pt in a wheel chair; accompanied by her daughter.  ? ?Cristina Morgan 87 y.o.  female with  multiple myeloma currently on Rev-Dex is proceed with Dara SQ today. ? ?Patient started Revlemid and had subsequent diarrhea of about 10 stools per day. Now about 3 per day with Imodium. Feeling better. Appetite is better. No recent nausea/vomiting. No fevers, night sweats.  ? ?The daughter states the blood sugars-are better controlled; ? ?Patient is currently at home.  Fatigue has improved   ? ?Review of Systems  ?Constitutional:  Positive for malaise/fatigue and weight loss. Negative for chills, diaphoresis and fever.  ?HENT:  Negative for nosebleeds and sore throat.   ?Eyes:  Negative for double vision.  ?Respiratory:  Negative for cough, hemoptysis, sputum production, shortness of breath and wheezing.   ?Cardiovascular:  Negative for chest pain, palpitations, orthopnea and leg swelling.  ?Gastrointestinal:  Negative for abdominal pain, blood in stool, constipation, diarrhea, heartburn, melena, nausea and vomiting.  ?Genitourinary:  Negative for dysuria, frequency and urgency.  ?Musculoskeletal:  Positive for back pain and joint pain.  ?Skin: Negative.  Negative for itching and rash.  ?Neurological:  Negative for dizziness, tingling, focal weakness, weakness and headaches.  ?Endo/Heme/Allergies:  Does not bruise/bleed easily.  ?Psychiatric/Behavioral:  Negative for depression. The patient is not nervous/anxious and does not have insomnia.    ? ?MEDICAL HISTORY:  ?Past Medical History:  ?Diagnosis Date  ? Anemia   ? CAD (  coronary artery disease)   ? Cataracts, bilateral   ? Closed compression fracture of first  lumbar vertebra ([employer])   ? Closed compression fracture of second lumbar vertebra ([place])   ? Diabetes mellitus without complication ([place])   ? High cholesterol   ? History of pneumonia 2010  ? Legionnaires  ? History of uterine fibroid   ? Hypercalcemia   ? Hyperlipidemia   ? Hypertension   ? Lytic bone lesions on xray   ? Multiple myeloma ([place])   ? Osteoporosis   ? Primary osteoarthritis of right knee   ? ? ?SURGICAL HISTORY: ?Past Surgical History:  ?Procedure Laterality Date  ? CATARACT EXTRACTION Bilateral 2014  ? COLONOSCOPY  2011  ? cleared for 5 yrs- [facility]  ? ECTOPIC PREGNANCY SURGERY    ? ? ?SOCIAL HISTORY: ?Social History  ? ?Socioeconomic History  ? Marital status: Married  ?  Spouse name: Not on file  ? Number of children: 2  ? Years of education: Not on file  ? Highest education level: 12th grade  ?Occupational History  ? Occupation: Retired  ?Tobacco Use  ? Smoking status: Never  ?  Passive exposure: Never  ? Smokeless tobacco: Never  ? Tobacco comments:  ?  smoking cessation materials not required  ?Vaping Use  ? Vaping Use: Never used  ?Substance and Sexual Activity  ? Alcohol use: No  ? Drug use: No  ? Sexual activity: Not Currently  ?Other Topics Concern  ? Not on file  ?Social History Narrative  ? [place] with husband; drives. Never smoked; no alcohol. Daughters live close.   ? ?Social Determinants of Health  ? ?Financial Resource Strain: Low Risk   ? Difficulty of Paying Living Expenses: Not very hard  ?Food Insecurity: No Food Insecurity  ? Worried About Charity fundraiser in the Last Year: Never true  ? Ran Out of Food in the Last Year: Never true  ?Transportation Needs: No Transportation Needs  ? Lack of Transportation (Medical): No  ? Lack of Transportation (Non-Medical): No  ?Physical Activity: Inactive  ? Days of Exercise per Week: 0 days  ? Minutes of Exercise per Session: 0 min  ?Stress: No Stress Concern Present  ? Feeling of Stress : Only a little  ?Social Connections: Moderately  Integrated  ? Frequency of Communication with Friends and Family: Three times a week  ? Frequency of Social Gatherings with Friends and Family: Three times a week  ? Attends Religious Services: 1 to 4 times per year  ? Active Member of Clubs or Organizations: No  ? Attends Archivist Meetings: Never  ? Marital Status: Married  ?Intimate Partner Violence: Not At Risk  ? Fear of Current or Ex-Partner: No  ? Emotionally Abused: No  ? Physically Abused: No  ? Sexually Abused: No  ? ? ?FAMILY HISTORY: ?Family History  ?Problem Relation Age of Onset  ? Cancer Mother   ?     breast  ? Heart disease Mother   ? Diabetes Father   ? Heart disease Father   ? Heart attack Father   ? Heart attack Brother   ? Diabetes Brother   ? ? ?ALLERGIES:  is allergic to latex and penicillins. ? ?MEDICATIONS:  ?Current Outpatient Medications  ?Medication Sig Dispense Refill  ? acetaminophen (TYLENOL) 325 MG tablet Take 325 mg by mouth 2 (two) times daily. START 2 days prior to chemo infusion. Take For 2 days; Do NOT take on the day  of infusion    ? acyclovir (ZOVIRAX) 400 MG tablet Take 1 tablet (400 mg total) by mouth 2 (two) times daily. 60 tablet 4  ? carvedilol (COREG) 3.125 MG tablet Take 1 tablet (3.125 mg total) by mouth 2 (two) times daily with a meal. 60 tablet 1  ? cholecalciferol (VITAMIN D3) 25 MCG (1000 UNIT) tablet Take 1,000 Units by mouth daily.    ? dexamethasone (DECADRON) 4 MG tablet Take 1 tablet (4 mg total) by mouth 2 (two) times daily. Start 2 days prior to infusion; Take it for 2 days. 60 tablet 3  ? Glucerna (GLUCERNA) LIQD Take 237 mLs by mouth.    ? hydrOXYzine (ATARAX) 10 MG tablet Take 1 tablet (10 mg total) by mouth at bedtime as needed. 90 tablet 1  ? insulin glargine (LANTUS SOLOSTAR) 100 UNIT/ML Solostar Pen Inject 5 Units into the skin daily. (Patient taking differently: Inject 6 Units into the skin daily.) 15 mL PRN  ? losartan (COZAAR) 25 MG tablet TAKE ONE TABLET BY MOUTH EVERY EVENING 90 tablet  0  ? lovastatin (MEVACOR) 40 MG tablet Take 1 tablet (40 mg total) by mouth every evening. 90 tablet 1  ? metFORMIN (GLUCOPHAGE) 1000 MG tablet TAKE ONE TABLET BY MOUTH TWICE DAILY 180 tablet 0  ? montelukast (SINGUL

## 2021-07-18 NOTE — Progress Notes (Signed)
Nutrition Follow-up: ? ? ?Patient with multiple myeloma.  Patient currently on chemotherapy.  Revlimid on hold due to diarrhea.  ? ?Met with patient and daughter prior to seeing PA and treatment today.  Appetite is a little bit better.  Has been having issues with diarrhea was as many as 10 stools per day now 3 per day.  Taking imodium has not started lomotil.  Drinking 2-3 glucerna shakes per day.  Having issues with reflux.  Has been drinking water, diet cranapple juice and pedilyte packets.  Yesterday was able to eat [place] necks with cauliflower rice and broccoli and cheese, peanut butter crackers.   ? ? ? ?Medications: reviewed ? ?Labs: reviewed, after visit ? ?Anthropometrics:  ? ?Weight 118 lb 4.8 oz  ? ? ?NUTRITION DIAGNOSIS: Inadequate oral intake ongoing ? ? ?INTERVENTION:  ?Discussed diet strategies to help with diarrhea. Handout provided ?Continue glucerna shakes  ?Patient to discuss reflux with PA today ?  ? ?MONITORING, EVALUATION, GOAL: weight trends, intake ? ? ?NEXT VISIT: to be determined with treatment ? ?Lorrena Goranson B. [name], RD, LDN ?Registered Dietitian ?[phone] ? ? ?

## 2021-07-18 NOTE — Patient Instructions (Signed)
[facility] AT [place]  Discharge Instructions: ?Thank you for choosing [place] to provide your oncology and hematology care.  ?If you have a lab appointment with the [facility], please go directly to the [facility] and check in at the registration area. ? ?Wear comfortable clothing and clothing appropriate for easy access to any Portacath or PICC line.  ? ?We strive to give you quality time with your provider. You may need to reschedule your appointment if you arrive late (15 or more minutes).  Arriving late affects you and other patients whose appointments are after yours.  Also, if you miss three or more appointments without notifying the office, you may be dismissed from the clinic at the provider?s discretion.    ?  ?For prescription refill requests, have your pharmacy contact our office and allow 72 hours for refills to be completed.   ? ?Today you received the following chemotherapy and/or immunotherapy agents DARZALEX    ?  ?To help prevent nausea and vomiting after your treatment, we encourage you to take your nausea medication as directed. ? ?BELOW ARE SYMPTOMS THAT SHOULD BE REPORTED IMMEDIATELY: ?*FEVER GREATER THAN 100.4 F (38 ?C) OR HIGHER ?*CHILLS OR SWEATING ?*NAUSEA AND VOMITING THAT IS NOT CONTROLLED WITH YOUR NAUSEA MEDICATION ?*UNUSUAL SHORTNESS OF BREATH ?*UNUSUAL BRUISING OR BLEEDING ?*URINARY PROBLEMS (pain or burning when urinating, or frequent urination) ?*BOWEL PROBLEMS (unusual diarrhea, constipation, pain near the anus) ?TENDERNESS IN MOUTH AND THROAT WITH OR WITHOUT PRESENCE OF ULCERS (sore throat, sores in mouth, or a toothache) ?UNUSUAL RASH, SWELLING OR PAIN  ?UNUSUAL VAGINAL DISCHARGE OR ITCHING  ? ?Items with * indicate a potential emergency and should be followed up as soon as possible or go to the Emergency Department if any problems should occur. ? ?Please show the CHEMOTHERAPY ALERT CARD or IMMUNOTHERAPY ALERT CARD at check-in to  the Emergency Department and triage nurse. ? ?Should you have questions after your visit or need to cancel or reschedule your appointment, please contact [facility] [place] AT [place]  [phone] and follow the prompts.  Office hours are 8:00 a.m. to 4:30 p.m. Monday - Friday. Please note that voicemails left after 4:00 p.m. may not be returned until the following business day.  We are closed weekends and major holidays. You have access to a nurse at all times for urgent questions. Please call the main number to the clinic [phone] and follow the prompts. ? ?For any non-urgent questions, you may also contact your provider using MyChart. We now offer e-Visits for anyone 68 and older to request care online for non-urgent symptoms. For details visit [email or web address]. ?  ?Also download the MyChart app! Go to the app store, search "MyChart", open the app, select [place], and log in with your MyChart username and password. ? ?Due to Covid, a mask is required upon entering the hospital/clinic. If you do not have a mask, one will be given to you upon arrival. For doctor visits, patients may have 1 support person aged 37 or older with them. For treatment visits, patients cannot have anyone with them due to current Covid guidelines and our immunocompromised population.  ? ?Daratumumab injection ?What is this medication? ?DARATUMUMAB (dar a toom ue mab) is a monoclonal antibody. It is used to treat multiple myeloma. ?This medicine may be used for other purposes; ask your health care provider or pharmacist if you have questions. ?COMMON BRAND NAME(S): DARZALEX ?What should I tell my care team before I  take this medication? ?They need to know if you have any of these conditions: ?hereditary fructose intolerance ?infection (especially a virus infection such as chickenpox, herpes, or hepatitis B virus) ?lung or breathing disease (asthma, COPD) ?an unusual or allergic reaction to daratumumab,  sorbitol, other medicines, foods, dyes, or preservatives ?pregnant or trying to get pregnant ?breast-feeding ?How should I use this medication? ?This medicine is for infusion into a vein. It is given by a health care professional in a hospital or clinic setting. ?Talk to your pediatrician regarding the use of this medicine in children. Special care may be needed. ?Overdosage: If you think you have taken too much of this medicine contact a poison control center or emergency room at once. ?NOTE: This medicine is only for you. Do not share this medicine with others. ?What if I miss a dose? ?Keep appointments for follow-up doses as directed. It is important not to miss your dose. Call your doctor or health care professional if you are unable to keep an appointment. ?What may interact with this medication? ?Interactions have not been studied. ?This list may not describe all possible interactions. Give your health care provider a list of all the medicines, herbs, non-prescription drugs, or dietary supplements you use. Also tell them if you smoke, drink alcohol, or use illegal drugs. Some items may interact with your medicine. ?What should I watch for while using this medication? ?Your condition will be monitored carefully while you are receiving this medicine. ?This medicine can cause serious allergic reactions. To reduce your risk, your health care provider may give you other medicine to take before receiving this one. Be sure to follow the directions from your health care provider. ?This medicine can affect the results of blood tests to match your blood type. These changes can last for up to 6 months after the final dose. Your healthcare provider will do blood tests to match your blood type before you start treatment. Tell all of your healthcare providers that you are being treated with this medicine before receiving a blood transfusion. ?This medicine can affect the results of some tests used to determine treatment  response; extra tests may be needed to evaluate response. ?Do not become pregnant while taking this medicine or for 3 months after stopping it. Women should inform their health care provider if they wish to become pregnant or think they might be pregnant. There is a potential for serious side effects to an unborn child. Talk to your health care provider for more information. Do not breast-feed an infant while taking this medicine. ?What side effects may I notice from receiving this medication? ?Side effects that you should report to your care team as soon as possible: ?Allergic reactions--skin rash, itching or hives, swelling of the face, lips, or tongue ?Blurred vision ?Infection--fever, chills, cough, sore throat, pain or trouble passing urine ?Infusion reactions--dizziness, fast heartbeat, feeling faint or lightheaded, falls, headache, increase in blood pressure, nausea, vomiting, or wheezing or trouble breathing with loud or whistling sounds ?Unusual bleeding or bruising ?Side effects that usually do not require medical attention (report to your care team if they continue or are bothersome): ?Constipation ?Diarrhea ?Pain, tingling, numbness in the hands or feet ?Swelling of the ankles, feet, hands ?Tiredness ?This list may not describe all possible side effects. Call your doctor for medical advice about side effects. You may report side effects to FDA at 1-800-FDA-1088. ?Where should I keep my medication? ?This drug is given in a hospital or clinic and will not  be stored at home. ?NOTE: This sheet is a summary. It may not cover all possible information. If you have questions about this medicine, talk to your doctor, pharmacist, or health care provider. ?? 2023 Elsevier/Gold Standard (2020-08-09 00:00:00) ? ?

## 2021-07-19 ENCOUNTER — Other Ambulatory Visit: Payer: Self-pay | Admitting: *Deleted

## 2021-07-19 ENCOUNTER — Ambulatory Visit (INDEPENDENT_AMBULATORY_CARE_PROVIDER_SITE_OTHER): Payer: Medicare Other | Admitting: Family Medicine

## 2021-07-19 ENCOUNTER — Encounter: Payer: Self-pay | Admitting: Family Medicine

## 2021-07-19 VITALS — BP 108/64 | HR 62 | Ht 66.0 in | Wt 119.0 lb

## 2021-07-19 DIAGNOSIS — E119 Type 2 diabetes mellitus without complications: Secondary | ICD-10-CM | POA: Diagnosis not present

## 2021-07-19 DIAGNOSIS — C9000 Multiple myeloma not having achieved remission: Secondary | ICD-10-CM

## 2021-07-19 NOTE — Progress Notes (Signed)
? ? ?Date:  07/19/2021  ? ?Name:  Cristina Morgan   DOB:  Aug 19, 1934   MRN:  [id number] ? ? ?Chief Complaint: Diabetes (Taking 12 units every day except Wed and Thursday- taking 14 units. Fasting BS this morning was 221 and yesterday 230, on 5/3- 141) ? ?Diabetes ?She presents for her follow-up diabetic visit. She has type 2 diabetes mellitus. Her disease course has been stable. There are no hypoglycemic associated symptoms. Pertinent negatives for hypoglycemia include no dizziness, headaches or nervousness/anxiousness. Pertinent negatives for diabetes include no blurred vision, no chest pain, no fatigue, no foot paresthesias, no polydipsia, no polyuria, no visual change and no weight loss. Symptoms are stable. There are no diabetic complications. Risk factors for coronary artery disease include dyslipidemia and hypertension. Current diabetic treatment includes insulin injections (14 units [place]).  ? ?Lab Results  ?Component Value Date  ? NA 130 (L) 07/18/2021  ? K 4.0 07/18/2021  ? CO2 23 07/18/2021  ? GLUCOSE 137 (H) 07/18/2021  ? BUN 32 (H) 07/18/2021  ? CREATININE 1.17 (H) 07/18/2021  ? CALCIUM 9.4 07/18/2021  ? EGFR 68 02/01/2021  ? GFRNONAA 46 (L) 07/18/2021  ? ?Lab Results  ?Component Value Date  ? CHOL 176 07/24/2020  ? HDL 74 07/24/2020  ? Green 90 07/24/2020  ? TRIG 63 07/24/2020  ? CHOLHDL 2.7 03/01/2018  ? ?Lab Results  ?Component Value Date  ? TSH 0.367 05/03/2021  ? ?Lab Results  ?Component Value Date  ? HGBA1C 8.9 (H) 07/01/2021  ? ?Lab Results  ?Component Value Date  ? WBC 5.7 07/18/2021  ? HGB 9.7 (L) 07/18/2021  ? HCT 29.6 (L) 07/18/2021  ? MCV 94.6 07/18/2021  ? PLT 277 07/18/2021  ? ?Lab Results  ?Component Value Date  ? ALT 14 07/18/2021  ? AST 21 07/18/2021  ? ALKPHOS 147 (H) 07/18/2021  ? BILITOT 0.7 07/18/2021  ? ?No results found for: 25OHVITD2, [place], VD25OH  ? ?Review of Systems  ?Constitutional:  Negative for chills, fatigue, fever and weight loss.  ?HENT:  Negative for drooling, ear  discharge, ear pain and sore throat.   ?Eyes:  Negative for blurred vision.  ?Respiratory:  Negative for cough, shortness of breath and wheezing.   ?Cardiovascular:  Negative for chest pain, palpitations and leg swelling.  ?Gastrointestinal:  Negative for abdominal pain, blood in stool, constipation, diarrhea and nausea.  ?Endocrine: Negative for polydipsia and polyuria.  ?Genitourinary:  Negative for dysuria, frequency, hematuria and urgency.  ?Musculoskeletal:  Negative for back pain, myalgias and neck pain.  ?Skin:  Negative for rash.  ?Allergic/Immunologic: Negative for environmental allergies.  ?Neurological:  Negative for dizziness and headaches.  ?Hematological:  Does not bruise/bleed easily.  ?Psychiatric/Behavioral:  Negative for suicidal ideas. The patient is not nervous/anxious.   ? ?Patient Active Problem List  ? Diagnosis Date Noted  ? Multiple myeloma not having achieved remission ([place]) 05/15/2021  ? Pressure injury of coccygeal region, stage 2 ([place]) 05/07/2021  ? Hyponatremia 05/04/2021  ? Pressure injury of skin 05/02/2021  ? Vitamin B12 deficiency 05/02/2021  ? Hypomagnesemia 05/02/2021  ? Hypophosphatemia 05/02/2021  ? SVT (supraventricular tachycardia) ([place]) 05/02/2021  ? Altered mental status 05/01/2021  ? Acute metabolic encephalopathy 95/62/1308  ? Hypercalcemia 05/01/2021  ? Closed compression fracture of second lumbar vertebra ([place]) 10/28/2020  ? Need for vaccination against Streptococcus pneumoniae using pneumococcal conjugate vaccine 13 01/07/2017  ? Primary osteoarthritis of right knee 12/08/2016  ? PNA (pneumonia) 07/09/2015  ? Acute chest pain 03/20/2014  ?  Type 2 diabetes mellitus ([place]) 03/20/2014  ? Hypercholesterolemia 03/20/2014  ? Essential hypertension 03/20/2014  ? ? ?Allergies  ?Allergen Reactions  ? Latex Rash  ? Penicillins Other (See Comments)  ? ? ?Past Surgical History:  ?Procedure Laterality Date  ? CATARACT EXTRACTION Bilateral 2014  ? COLONOSCOPY  2011  ? cleared for 5  yrs- [facility]  ? ECTOPIC PREGNANCY SURGERY    ? ? ?Social History  ? ?Tobacco Use  ? Smoking status: Never  ?  Passive exposure: Never  ? Smokeless tobacco: Never  ? Tobacco comments:  ?  smoking cessation materials not required  ?Vaping Use  ? Vaping Use: Never used  ?Substance Use Topics  ? Alcohol use: No  ? Drug use: No  ? ? ? ?Medication list has been reviewed and updated. ? ?Current Meds  ?Medication Sig  ? ACCU-CHEK GUIDE test strip USE 1 STRIP TO CHECK GLUCOSE ONCE DAILY AS DIRECTED  ? Accu-Chek Softclix Lancets lancets USE 1 TO CHECK GLUCOSE ONCE DAILY  ? acetaminophen (TYLENOL) 325 MG tablet Take 325 mg by mouth 2 (two) times daily. START 2 days prior to chemo infusion. Take For 2 days; Do NOT take on the day of infusion  ? acyclovir (ZOVIRAX) 400 MG tablet Take 1 tablet (400 mg total) by mouth 2 (two) times daily.  ? baclofen (LIORESAL) 20 MG tablet Take 1 tablet (20 mg total) by mouth 2 (two) times daily.  ? BD PEN NEEDLE NANO 2ND GEN 32G X 4 MM MISC USE 1 ONCE DAILY  ? Blood Glucose Monitoring Suppl (ACCU-CHEK GUIDE) w/Device KIT USE AS DIRECTED TO CHECK GLUCOSE  ? Calcium Carbonate-Vit D-Min (CALCIUM 1200 PO) Take 1 capsule by mouth daily.  ? carvedilol (COREG) 3.125 MG tablet Take 1 tablet (3.125 mg total) by mouth 2 (two) times daily with a meal.  ? cholecalciferol (VITAMIN D3) 25 MCG (1000 UNIT) tablet Take 1,000 Units by mouth daily.  ? dexamethasone (DECADRON) 4 MG tablet Take 1 tablet (4 mg total) by mouth 2 (two) times daily. Start 2 days prior to infusion; Take it for 2 days.  ? Glucerna (GLUCERNA) LIQD Take 237 mLs by mouth.  ? hydrOXYzine (ATARAX) 10 MG tablet Take 1 tablet (10 mg total) by mouth at bedtime as needed.  ? insulin glargine (LANTUS SOLOSTAR) 100 UNIT/ML Solostar Pen Inject 5 Units into the skin daily. (Patient taking differently: Inject 12 Units into the skin daily. 12 units every day except Wed and Thursday- 14 units)  ? losartan (COZAAR) 25 MG tablet TAKE ONE TABLET BY MOUTH EVERY  EVENING  ? lovastatin (MEVACOR) 40 MG tablet Take 1 tablet (40 mg total) by mouth every evening.  ? metFORMIN (GLUCOPHAGE) 1000 MG tablet TAKE ONE TABLET BY MOUTH TWICE DAILY  ? montelukast (SINGULAIR) 10 MG tablet Take 1 tablet (10 mg total) by mouth daily. START 2 days prior to chemo infusion. Take For 2 days; Do NOT take on the day of infusion.  ? ondansetron (ZOFRAN) 4 MG tablet Take 1 tablet (4 mg total) by mouth every 6 (six) hours as needed for nausea.  ? pantoprazole (PROTONIX) 40 MG tablet Take 1 tablet (40 mg total) by mouth daily.  ? sertraline (ZOLOFT) 25 MG tablet Take 1 tablet (25 mg total) by mouth daily.  ? ? ? ?  07/01/2021  ?  3:04 PM 05/17/2021  ?  1:46 PM 02/01/2021  ?  2:15 PM 12/12/2020  ?  8:36 AM  ?GAD 7 : Generalized Anxiety  Score  ?Nervous, Anxious, on Edge 0 0 0 0  ?Control/stop worrying 0 1 0 0  ?Worry too much - different things 0 1 0 0  ?Trouble relaxing 0 0 1 0  ?Restless 0 0  0  ?Easily annoyed or irritable 0 0 0 0  ?Afraid - awful might happen 0 0 0 0  ?Total GAD 7 Score 0 2  0  ?Anxiety Difficulty Not difficult at all Not difficult at all    ? ? ? ?  07/01/2021  ?  3:04 PM  ?Depression screen PHQ 2/9  ?Decreased Interest 0  ?Down, Depressed, Hopeless 0  ?PHQ - 2 Score 0  ?Altered sleeping 0  ?Tired, decreased energy 0  ?Change in appetite 0  ?Feeling bad or failure about yourself  0  ?Trouble concentrating 0  ?Moving slowly or fidgety/restless 0  ?Suicidal thoughts 0  ?PHQ-9 Score 0  ?Difficult doing work/chores Not difficult at all  ? ? ?BP Readings from Last 3 Encounters:  ?07/19/21 108/64  ?07/18/21 (!) 127/57  ?07/11/21 (!) 102/50  ? ? ?Physical Exam ?HENT:  ?   Nose: No congestion or rhinorrhea.  ?Cardiovascular:  ?   Heart sounds: S1 normal and S2 normal. No murmur heard. ?No systolic murmur is present.  ?No diastolic murmur is present.  ?  No S3 or S4 sounds.  ?Pulmonary:  ?   Breath sounds: No wheezing or rhonchi.  ? ? ?Wt Readings from Last 3 Encounters:  ?07/19/21 119 lb (54  kg)  ?07/18/21 118 lb 4.8 oz (53.7 kg)  ?07/11/21 126 lb 12.5 oz (57.5 kg)  ? ? ?BP 108/64   Pulse 62   Ht '5\' 6"'  (1.676 m)   Wt 119 lb (54 kg)   BMI 19.21 kg/m?  ? ?Assessment and Plan: ? ?1. Type 2 diab

## 2021-07-20 ENCOUNTER — Encounter: Payer: Self-pay | Admitting: Internal Medicine

## 2021-07-20 LAB — HEMOGLOBIN A1C
Est. average glucose Bld gHb Est-mCnc: 203 mg/dL
Hgb A1c MFr Bld: 8.7 % — ABNORMAL HIGH (ref 4.8–5.6)

## 2021-07-20 MED ORDER — LENALIDOMIDE 15 MG PO CAPS: 15.0000 mg | ORAL_CAPSULE | Freq: Every day | ORAL | 0 refills | Status: DC

## 2021-07-23 DIAGNOSIS — I2510 Atherosclerotic heart disease of native coronary artery without angina pectoris: Secondary | ICD-10-CM | POA: Diagnosis not present

## 2021-07-23 DIAGNOSIS — I10 Essential (primary) hypertension: Secondary | ICD-10-CM | POA: Diagnosis not present

## 2021-07-23 DIAGNOSIS — C9000 Multiple myeloma not having achieved remission: Secondary | ICD-10-CM | POA: Diagnosis not present

## 2021-07-23 DIAGNOSIS — E871 Hypo-osmolality and hyponatremia: Secondary | ICD-10-CM | POA: Diagnosis not present

## 2021-07-23 DIAGNOSIS — M8458XD Pathological fracture in neoplastic disease, other specified site, subsequent encounter for fracture with routine healing: Secondary | ICD-10-CM | POA: Diagnosis not present

## 2021-07-25 ENCOUNTER — Other Ambulatory Visit: Payer: Self-pay

## 2021-07-25 ENCOUNTER — Encounter: Payer: Self-pay | Admitting: Internal Medicine

## 2021-07-25 ENCOUNTER — Telehealth: Payer: Self-pay | Admitting: *Deleted

## 2021-07-25 ENCOUNTER — Inpatient Hospital Stay: Payer: Medicare Other

## 2021-07-25 ENCOUNTER — Inpatient Hospital Stay (HOSPITAL_BASED_OUTPATIENT_CLINIC_OR_DEPARTMENT_OTHER): Payer: Medicare Other | Admitting: Internal Medicine

## 2021-07-25 VITALS — BP 182/86 | HR 77

## 2021-07-25 DIAGNOSIS — C9000 Multiple myeloma not having achieved remission: Secondary | ICD-10-CM | POA: Diagnosis not present

## 2021-07-25 DIAGNOSIS — R197 Diarrhea, unspecified: Secondary | ICD-10-CM | POA: Diagnosis not present

## 2021-07-25 DIAGNOSIS — I959 Hypotension, unspecified: Secondary | ICD-10-CM | POA: Diagnosis not present

## 2021-07-25 DIAGNOSIS — Z20822 Contact with and (suspected) exposure to covid-19: Secondary | ICD-10-CM | POA: Diagnosis not present

## 2021-07-25 DIAGNOSIS — D649 Anemia, unspecified: Secondary | ICD-10-CM | POA: Diagnosis not present

## 2021-07-25 DIAGNOSIS — Z5112 Encounter for antineoplastic immunotherapy: Secondary | ICD-10-CM | POA: Diagnosis not present

## 2021-07-25 LAB — COMPREHENSIVE METABOLIC PANEL
ALT: 14 U/L (ref 0–44)
AST: 20 U/L (ref 15–41)
Albumin: 3.7 g/dL (ref 3.5–5.0)
Alkaline Phosphatase: 144 U/L — ABNORMAL HIGH (ref 38–126)
Anion gap: 6 (ref 5–15)
BUN: 29 mg/dL — ABNORMAL HIGH (ref 8–23)
CO2: 23 mmol/L (ref 22–32)
Calcium: 9.3 mg/dL (ref 8.9–10.3)
Chloride: 103 mmol/L (ref 98–111)
Creatinine, Ser: 0.97 mg/dL (ref 0.44–1.00)
GFR, Estimated: 57 mL/min — ABNORMAL LOW (ref >60)
Glucose, Bld: 154 mg/dL — ABNORMAL HIGH (ref 70–99)
Potassium: 4 mmol/L (ref 3.5–5.1)
Sodium: 132 mmol/L — ABNORMAL LOW (ref 135–145)
Total Bilirubin: 0.5 mg/dL (ref 0.3–1.2)
Total Protein: 6.7 g/dL (ref 6.5–8.1)

## 2021-07-25 LAB — CBC WITH DIFFERENTIAL/PLATELET
Abs Immature Granulocytes: 0.01 10*3/uL (ref 0.00–0.07)
Basophils Absolute: 0 10*3/uL (ref 0.0–0.1)
Basophils Relative: 0 %
Eosinophils Absolute: 0 10*3/uL (ref 0.0–0.5)
Eosinophils Relative: 0 %
HCT: 31.2 % — ABNORMAL LOW (ref 36.0–46.0)
Hemoglobin: 9.9 g/dL — ABNORMAL LOW (ref 12.0–15.0)
Immature Granulocytes: 0 %
Lymphocytes Relative: 61 %
Lymphs Abs: 2 10*3/uL (ref 0.7–4.0)
MCH: 30.2 pg (ref 26.0–34.0)
MCHC: 31.7 g/dL (ref 30.0–36.0)
MCV: 95.1 fL (ref 80.0–100.0)
Monocytes Absolute: 0.6 10*3/uL (ref 0.1–1.0)
Monocytes Relative: 18 %
Neutro Abs: 0.7 10*3/uL — ABNORMAL LOW (ref 1.7–7.7)
Neutrophils Relative %: 21 %
Platelets: 257 10*3/uL (ref 150–400)
RBC: 3.28 MIL/uL — ABNORMAL LOW (ref 3.87–5.11)
RDW: 15.8 % — ABNORMAL HIGH (ref 11.5–15.5)
WBC: 3.3 10*3/uL — ABNORMAL LOW (ref 4.0–10.5)
nRBC: 0 % (ref 0.0–0.2)

## 2021-07-25 MED ORDER — DARATUMUMAB-HYALURONIDASE-FIHJ 1800-30000 MG-UT/15ML ~~LOC~~ SOLN
1800.0000 mg | Freq: Once | SUBCUTANEOUS | Status: AC
  Administered 2021-07-25: 1800 mg via SUBCUTANEOUS
  Filled 2021-07-25: qty 15

## 2021-07-25 MED ORDER — CARVEDILOL 3.125 MG PO TABS: 3.1250 mg | ORAL_TABLET | Freq: Two times a day (BID) | ORAL | 0 refills | Status: DC

## 2021-07-25 MED ORDER — ACETAMINOPHEN 325 MG PO TABS
650.0000 mg | ORAL_TABLET | Freq: Once | ORAL | Status: AC
  Administered 2021-07-25: 650 mg via ORAL
  Filled 2021-07-25: qty 2

## 2021-07-25 MED ORDER — ZOLEDRONIC ACID 4 MG/5ML IV CONC
3.0000 mg | Freq: Once | INTRAVENOUS | Status: AC
  Administered 2021-07-25: 3 mg via INTRAVENOUS
  Filled 2021-07-25: qty 3.75

## 2021-07-25 MED ORDER — LENALIDOMIDE 10 MG PO CAPS: 10.0000 mg | ORAL_CAPSULE | Freq: Every day | ORAL | 3 refills | Status: DC

## 2021-07-25 MED ORDER — DEXAMETHASONE 4 MG PO TABS
20.0000 mg | ORAL_TABLET | Freq: Once | ORAL | Status: AC
  Administered 2021-07-25: 20 mg via ORAL
  Filled 2021-07-25: qty 5

## 2021-07-25 MED ORDER — DIPHENHYDRAMINE HCL 25 MG PO CAPS
50.0000 mg | ORAL_CAPSULE | Freq: Once | ORAL | Status: AC
  Administered 2021-07-25: 50 mg via ORAL
  Filled 2021-07-25: qty 2

## 2021-07-25 MED ORDER — SODIUM CHLORIDE 0.9 % IV SOLN
Freq: Once | INTRAVENOUS | Status: AC
  Filled 2021-07-25: qty 250

## 2021-07-25 NOTE — Progress Notes (Signed)
ANC 0.7. Per Dr Rogue Bussing okay to proceed with Darzalex-Faspro today ?

## 2021-07-25 NOTE — Telephone Encounter (Signed)
Pharmacy called stating that the auth # on patient Lenalidomide prescription is for another patient. Please correct and resubmit new prescription to them ?

## 2021-07-25 NOTE — Assessment & Plan Note (Addendum)
#  STAGE I- MULTIPLE MYELOMA -Active [bone lesions; hypercalcemia; anemia; No renal insuffiencey;Bone marrow-32% plasma cell-   STANDARD Cytogenetics].  Currently on Revlimid-Dex- Dara SQ. Partial response noted; April 2023-M protein = 0.5 gm/fl; K/L= 1. 56 ? ?#Proceed with Dara SQ today; Labs today reviewed;  acceptable for treatment today. But HOLD revlimid until next visit-see below.  We will start Revlimid at 10 mg dose  At next visit.  ? ?# Diarrhea- 10 loose BM/yesterday- likely sec to Revlimid.RE-START next 2 weeks- at lower dose 10 mg- 3 weeks on and 1 wek off.  ? ?# Diabetes-on oral hypoglycemic agents; BG-154  on longacting insulin; 14 units of lantus on T/W/Thursday. discussed with Dr.Jones.  ? ?# HTN: continue to Hold OFF losartan; continue coreg; check BP at home; bring log to next visit.  ? ?#Hypercalcemia secondary to multiple myeloma- [FEB 2023-Zometa; calcitonin]-calcium today is 9.26  HOLD  Zometa 3 mg possibly in 2 weeks.  ? ?#DVT/shingles prophylaxis: Aspirin/acyclovir. STABLE ? ?# IV access:PIV  ? ?[name]- [phone] ? ?zometa-q 4w ? ?# DISPOSITION:  ?# proceed with  dara SQ today;  Zometa today ? ?# follow up in 1 weeks- NP: labs- ; cbc/cmp; Dara SQ ? ?# follow up in 2 weeks- MD; labs- cbc/cmp;MM panel. [place]; Dara SQ;Dr.B ? ? ? ?

## 2021-07-25 NOTE — Telephone Encounter (Addendum)
Revlimide 10 mg auth, 3 weeks on 1 week off. ? ? ?Lorie Apley #[id number]  Obtained 07/25/21 Good for 30 days. ?Pt next survey will be 11/05/21. ?New request sent to Dr. Jacinto Reap. ?

## 2021-07-25 NOTE — Progress Notes (Signed)
[place] ?CONSULT NOTE ? ?Patient Care Team: ?Juline Patch, MD as PCP - General (Family Medicine) ?Vladimir Faster, [place] (Inactive) (Pharmacist) ?Cammie Sickle, MD as Consulting Physician (Oncology) ? ?CHIEF COMPLAINTS/PURPOSE OF CONSULTATION: Multiple myeloma ? ?Oncology History Overview Note  ?# MULTIPLE MYELOMA  [FEB 2023-hypercalcemia status changes]-Active [bone lesions; hypercalcemia; anemia; No renal insuffiencey;Bone marrow-32% plasma cell-   STANDARD Cytogenetics].FEB 2023- S/p dexamethasone 20 mg q day x4. ? ? ?# REV [15 mg -3 week-On and 1 week-OFF]-DARA; May 2023-discontinued rev 15 diarrhea/hypotension ? ?# MAY 2023- dara-Rev 10 mg 3w/1w ? ? ?# FEB 2023-hypercalcemia [[facility]-status post calcitonin; bisphosphonate] ? ?BONE MARROW, ASPIRATE, CLOT, CORE:  ?-Hypercellular bone marrow with plasma cell neoplasm  ?-See comment  ? ?PERIPHERAL BLOOD:  ?-Normocytic-normochromic anemia  ? ?COMMENT:  ? ?The bone marrow is hypercellular for age with increased number of  ?atypical plasma cells representing 32% of all cells in the aspirate  ?associated with interstitial infiltrates and numerous variably sized  ?clusters in the clot/biopsy sections.  The plasma cells display weak  ?kappa light chain restriction consistent with plasma cell neoplasm.  ?Correlation with cytogenetic and FISH studies is recommended  ? ?MICROSCOPIC DESCRIPTION:  ? ?PERIPHERAL BLOOD SMEAR: The red blood cells display mild  ?anisopoikilocytosis with mild polychromasia.  The white blood cells are  ?normal number with scattered hypogranular neutrophils. An occasional  ?myelocyte and large atypical mononuclear cells are seen on scan. The  ?platelets are normal in number.  ?  ?Multiple myeloma not having achieved remission ([name]  ?05/15/2021 Initial Diagnosis  ? Multiple myeloma not having achieved remission ([name] ?  ?06/27/2021 -  Chemotherapy  ? Patient is on Treatment Plan : MYELOMA Daratumumab SQ + Lenalidomide +  Dexamethasone (DaraRd) q28d  ? ?   ?07/11/2021 Cancer Staging  ? Staging form: Plasma Cell Myeloma and Plasma Cell Disorders, AJCC 8th Edition ?- Clinical: Beta-2-microglobulin (mg/L): 2.6, Albumin (g/dL): 3.9, ISS: Stage I - Signed by Cammie Sickle, MD on 07/11/2021 ?Stage prefix: Initial diagnosis ?Beta 2 microglobulin range (mg/L): Less than 3.5 ?Albumin range (g/dL): Greater than or equal to 3.5 ? ?  ? ? ?HISTORY OF PRESENTING ILLNESS: pt in a wheel chair; accompanied by her daughter.  ? ?Cristina Morgan 87 y.o.  female with  multiple myeloma currently on Rev-Dex is proceed with Dara SQ today. ? ?Revlimid was held l 2 weeks ago because of diarrhea.  Currently resolved after stopping Revlimid. ? ?Her appetite is better.  Blood sugars are better.  She is gaining weight. ? ?Patient states her back pain is improved.  Denies any worsening joint pains.  No falls.  No further episodes of mental status changes.  ? ? ?Review of Systems  ?Constitutional:  Positive for malaise/fatigue and weight loss. Negative for chills, diaphoresis and fever.  ?HENT:  Negative for nosebleeds and sore throat.   ?Eyes:  Negative for double vision.  ?Respiratory:  Negative for cough, hemoptysis, sputum production, shortness of breath and wheezing.   ?Cardiovascular:  Negative for chest pain, palpitations, orthopnea and leg swelling.  ?Gastrointestinal:  Negative for abdominal pain, blood in stool, constipation, diarrhea, heartburn, melena, nausea and vomiting.  ?Genitourinary:  Negative for dysuria, frequency and urgency.  ?Musculoskeletal:  Positive for back pain and joint pain.  ?Skin: Negative.  Negative for itching and rash.  ?Neurological:  Negative for dizziness, tingling, focal weakness, weakness and headaches.  ?Endo/Heme/Allergies:  Does not bruise/bleed easily.  ?Psychiatric/Behavioral:  Negative for depression. The patient is  not nervous/anxious and does not have insomnia.    ? ?MEDICAL HISTORY:  ?Past Medical History:   ?Diagnosis Date  ? Anemia   ? CAD (coronary artery disease)   ? Cataracts, bilateral   ? Closed compression fracture of first lumbar vertebra ([place])   ? Closed compression fracture of second lumbar vertebra ([place])   ? Diabetes mellitus without complication ([place])   ? High cholesterol   ? History of pneumonia 2010  ? Legionnaires  ? History of uterine fibroid   ? Hypercalcemia   ? Hyperlipidemia   ? Hypertension   ? Lytic bone lesions on xray   ? Multiple myeloma ([place])   ? Osteoporosis   ? Primary osteoarthritis of right knee   ? ? ?SURGICAL HISTORY: ?Past Surgical History:  ?Procedure Laterality Date  ? CATARACT EXTRACTION Bilateral 2014  ? COLONOSCOPY  2011  ? cleared for 5 yrs- [facility]  ? ECTOPIC PREGNANCY SURGERY    ? ? ?SOCIAL HISTORY: ?Social History  ? ?Socioeconomic History  ? Marital status: Married  ?  Spouse name: Not on file  ? Number of children: 2  ? Years of education: Not on file  ? Highest education level: 12th grade  ?Occupational History  ? Occupation: Retired  ?Tobacco Use  ? Smoking status: Never  ?  Passive exposure: Never  ? Smokeless tobacco: Never  ? Tobacco comments:  ?  smoking cessation materials not required  ?Vaping Use  ? Vaping Use: Never used  ?Substance and Sexual Activity  ? Alcohol use: No  ? Drug use: No  ? Sexual activity: Not Currently  ?Other Topics Concern  ? Not on file  ?Social History Narrative  ? [place] with husband; drives. Never smoked; no alcohol. Daughters live close.   ? ?Social Determinants of Health  ? ?Financial Resource Strain: Low Risk   ? Difficulty of Paying Living Expenses: Not very hard  ?Food Insecurity: No Food Insecurity  ? Worried About Charity fundraiser in the Last Year: Never true  ? Ran Out of Food in the Last Year: Never true  ?Transportation Needs: No Transportation Needs  ? Lack of Transportation (Medical): No  ? Lack of Transportation (Non-Medical): No  ?Physical Activity: Inactive  ? Days of Exercise per Week: 0 days  ? Minutes of Exercise  per Session: 0 min  ?Stress: No Stress Concern Present  ? Feeling of Stress : Only a little  ?Social Connections: Moderately Integrated  ? Frequency of Communication with Friends and Family: Three times a week  ? Frequency of Social Gatherings with Friends and Family: Three times a week  ? Attends Religious Services: 1 to 4 times per year  ? Active Member of Clubs or Organizations: No  ? Attends Archivist Meetings: Never  ? Marital Status: Married  ?Intimate Partner Violence: Not At Risk  ? Fear of Current or Ex-Partner: No  ? Emotionally Abused: No  ? Physically Abused: No  ? Sexually Abused: No  ? ? ?FAMILY HISTORY: ?Family History  ?Problem Relation Age of Onset  ? Cancer Mother   ?     breast  ? Heart disease Mother   ? Diabetes Father   ? Heart disease Father   ? Heart attack Father   ? Heart attack Brother   ? Diabetes Brother   ? ? ?ALLERGIES:  is allergic to latex and penicillins. ? ?MEDICATIONS:  ?Current Outpatient Medications  ?Medication Sig Dispense Refill  ? ACCU-CHEK GUIDE test strip  USE 1 STRIP TO CHECK GLUCOSE ONCE DAILY AS DIRECTED    ? Accu-Chek Softclix Lancets lancets USE 1 TO CHECK GLUCOSE ONCE DAILY    ? acetaminophen (TYLENOL) 325 MG tablet Take 325 mg by mouth 2 (two) times daily. START 2 days prior to chemo infusion. Take For 2 days; Do NOT take on the day of infusion    ? acyclovir (ZOVIRAX) 400 MG tablet Take 1 tablet (400 mg total) by mouth 2 (two) times daily. 60 tablet 4  ? baclofen (LIORESAL) 20 MG tablet Take 1 tablet (20 mg total) by mouth 2 (two) times daily. 30 tablet 0  ? BD PEN NEEDLE NANO 2ND GEN 32G X 4 MM MISC USE 1 ONCE DAILY    ? Blood Glucose Monitoring Suppl (ACCU-CHEK GUIDE) w/Device KIT USE AS DIRECTED TO CHECK GLUCOSE    ? Calcium Carbonate-Vit D-Min (CALCIUM 1200 PO) Take 1 capsule by mouth daily.    ? carvedilol (COREG) 3.125 MG tablet Take 1 tablet (3.125 mg total) by mouth 2 (two) times daily with a meal. 60 tablet 1  ? cholecalciferol (VITAMIN D3) 25  MCG (1000 UNIT) tablet Take 1,000 Units by mouth daily.    ? dexamethasone (DECADRON) 4 MG tablet Take 1 tablet (4 mg total) by mouth 2 (two) times daily. Start 2 days prior to infusion; Take it for 2 days. 60 tablet

## 2021-07-29 ENCOUNTER — Encounter: Payer: Self-pay | Admitting: Pharmacist

## 2021-07-29 NOTE — Telephone Encounter (Signed)
Encounter opened in error

## 2021-07-29 NOTE — Telephone Encounter (Signed)
[employer] calling again this morning asking for a return call for clarification of prescription sent please call [phone] opt 3 ?

## 2021-07-29 NOTE — Telephone Encounter (Signed)
07/25/21 MD note:  Diarrhea- 10 loose BM/yesterday- likely sec to Revlimid.RE-START next 2 weeks- at lower dose 10 mg- 3 weeks on and 1 wek off.  ?

## 2021-07-30 NOTE — Telephone Encounter (Signed)
Pharmacist [place] notified with [facility]. ?

## 2021-07-31 DIAGNOSIS — M8458XD Pathological fracture in neoplastic disease, other specified site, subsequent encounter for fracture with routine healing: Secondary | ICD-10-CM | POA: Diagnosis not present

## 2021-07-31 DIAGNOSIS — I10 Essential (primary) hypertension: Secondary | ICD-10-CM | POA: Diagnosis not present

## 2021-07-31 DIAGNOSIS — C9000 Multiple myeloma not having achieved remission: Secondary | ICD-10-CM | POA: Diagnosis not present

## 2021-07-31 DIAGNOSIS — E871 Hypo-osmolality and hyponatremia: Secondary | ICD-10-CM | POA: Diagnosis not present

## 2021-07-31 DIAGNOSIS — I2510 Atherosclerotic heart disease of native coronary artery without angina pectoris: Secondary | ICD-10-CM | POA: Diagnosis not present

## 2021-08-01 ENCOUNTER — Ambulatory Visit: Payer: Medicare Other

## 2021-08-01 ENCOUNTER — Inpatient Hospital Stay: Payer: Medicare Other

## 2021-08-01 ENCOUNTER — Encounter: Payer: Self-pay | Admitting: Medical Oncology

## 2021-08-01 ENCOUNTER — Inpatient Hospital Stay (HOSPITAL_BASED_OUTPATIENT_CLINIC_OR_DEPARTMENT_OTHER): Payer: Medicare Other | Admitting: Medical Oncology

## 2021-08-01 ENCOUNTER — Other Ambulatory Visit: Payer: Medicare Other

## 2021-08-01 ENCOUNTER — Ambulatory Visit: Payer: Medicare Other | Admitting: Medical Oncology

## 2021-08-01 ENCOUNTER — Inpatient Hospital Stay: Payer: Medicare Other | Admitting: Medical Oncology

## 2021-08-01 VITALS — BP 126/51 | HR 75 | Temp 97.4°F | Resp 14 | Wt 123.0 lb

## 2021-08-01 VITALS — BP 151/61 | HR 70

## 2021-08-01 DIAGNOSIS — D649 Anemia, unspecified: Secondary | ICD-10-CM | POA: Diagnosis not present

## 2021-08-01 DIAGNOSIS — R197 Diarrhea, unspecified: Secondary | ICD-10-CM | POA: Diagnosis not present

## 2021-08-01 DIAGNOSIS — C9000 Multiple myeloma not having achieved remission: Secondary | ICD-10-CM | POA: Diagnosis not present

## 2021-08-01 DIAGNOSIS — Z5112 Encounter for antineoplastic immunotherapy: Secondary | ICD-10-CM | POA: Diagnosis not present

## 2021-08-01 DIAGNOSIS — I959 Hypotension, unspecified: Secondary | ICD-10-CM | POA: Diagnosis not present

## 2021-08-01 LAB — COMPREHENSIVE METABOLIC PANEL
ALT: 12 U/L (ref 0–44)
AST: 21 U/L (ref 15–41)
Albumin: 3.7 g/dL (ref 3.5–5.0)
Alkaline Phosphatase: 124 U/L (ref 38–126)
Anion gap: 7 (ref 5–15)
BUN: 23 mg/dL (ref 8–23)
CO2: 23 mmol/L (ref 22–32)
Calcium: 9 mg/dL (ref 8.9–10.3)
Chloride: 103 mmol/L (ref 98–111)
Creatinine, Ser: 1.11 mg/dL — ABNORMAL HIGH (ref 0.44–1.00)
GFR, Estimated: 49 mL/min — ABNORMAL LOW (ref >60)
Glucose, Bld: 132 mg/dL — ABNORMAL HIGH (ref 70–99)
Potassium: 4.2 mmol/L (ref 3.5–5.1)
Sodium: 133 mmol/L — ABNORMAL LOW (ref 135–145)
Total Bilirubin: 0.6 mg/dL (ref 0.3–1.2)
Total Protein: 6.7 g/dL (ref 6.5–8.1)

## 2021-08-01 LAB — CBC WITH DIFFERENTIAL/PLATELET
Abs Immature Granulocytes: 0.01 10*3/uL (ref 0.00–0.07)
Basophils Absolute: 0 10*3/uL (ref 0.0–0.1)
Basophils Relative: 0 %
Eosinophils Absolute: 0 10*3/uL (ref 0.0–0.5)
Eosinophils Relative: 0 %
HCT: 29.9 % — ABNORMAL LOW (ref 36.0–46.0)
Hemoglobin: 9.7 g/dL — ABNORMAL LOW (ref 12.0–15.0)
Immature Granulocytes: 0 %
Lymphocytes Relative: 41 %
Lymphs Abs: 2.2 10*3/uL (ref 0.7–4.0)
MCH: 31.1 pg (ref 26.0–34.0)
MCHC: 32.4 g/dL (ref 30.0–36.0)
MCV: 95.8 fL (ref 80.0–100.0)
Monocytes Absolute: 0.5 10*3/uL (ref 0.1–1.0)
Monocytes Relative: 9 %
Neutro Abs: 2.6 10*3/uL (ref 1.7–7.7)
Neutrophils Relative %: 50 %
Platelets: 344 10*3/uL (ref 150–400)
RBC: 3.12 MIL/uL — ABNORMAL LOW (ref 3.87–5.11)
RDW: 16.3 % — ABNORMAL HIGH (ref 11.5–15.5)
WBC: 5.3 10*3/uL (ref 4.0–10.5)
nRBC: 0 % (ref 0.0–0.2)

## 2021-08-01 MED ORDER — ACETAMINOPHEN 325 MG PO TABS
650.0000 mg | ORAL_TABLET | Freq: Once | ORAL | Status: AC
  Administered 2021-08-01: 650 mg via ORAL
  Filled 2021-08-01: qty 2

## 2021-08-01 MED ORDER — DEXAMETHASONE 4 MG PO TABS
20.0000 mg | ORAL_TABLET | Freq: Once | ORAL | Status: AC
  Administered 2021-08-01: 20 mg via ORAL
  Filled 2021-08-01: qty 5

## 2021-08-01 MED ORDER — DARATUMUMAB-HYALURONIDASE-FIHJ 1800-30000 MG-UT/15ML ~~LOC~~ SOLN
1800.0000 mg | Freq: Once | SUBCUTANEOUS | Status: AC
  Administered 2021-08-01: 1800 mg via SUBCUTANEOUS
  Filled 2021-08-01: qty 15

## 2021-08-01 MED ORDER — DIPHENHYDRAMINE HCL 25 MG PO CAPS
50.0000 mg | ORAL_CAPSULE | Freq: Once | ORAL | Status: AC
  Administered 2021-08-01: 50 mg via ORAL
  Filled 2021-08-01: qty 2

## 2021-08-01 NOTE — Progress Notes (Signed)
Patient tolerated Dara SQ injection well, no questions/concerns voiced. Patient stable at discharge. AVS given.

## 2021-08-01 NOTE — Patient Instructions (Signed)
[facility] AT [place]  Discharge Instructions: Thank you for choosing [facility] to provide your oncology and hematology care.  If you have a lab appointment with the [facility], please go directly to the [facility] and check in at the registration area.  Wear comfortable clothing and clothing appropriate for easy access to any Portacath or PICC line.   We strive to give you quality time with your provider. You may need to reschedule your appointment if you arrive late (15 or more minutes).  Arriving late affects you and other patients whose appointments are after yours.  Also, if you miss three or more appointments without notifying the office, you may be dismissed from the clinic at the provider's discretion.      For prescription refill requests, have your pharmacy contact our office and allow 72 hours for refills to be completed.    Today you received the following chemotherapy and/or immunotherapy agents: daratumumab   To help prevent nausea and vomiting after your treatment, we encourage you to take your nausea medication as directed.  BELOW ARE SYMPTOMS THAT SHOULD BE REPORTED IMMEDIATELY: *FEVER GREATER THAN 100.4 F (38 C) OR HIGHER *CHILLS OR SWEATING *NAUSEA AND VOMITING THAT IS NOT CONTROLLED WITH YOUR NAUSEA MEDICATION *UNUSUAL SHORTNESS OF BREATH *UNUSUAL BRUISING OR BLEEDING *URINARY PROBLEMS (pain or burning when urinating, or frequent urination) *BOWEL PROBLEMS (unusual diarrhea, constipation, pain near the anus) TENDERNESS IN MOUTH AND THROAT WITH OR WITHOUT PRESENCE OF ULCERS (sore throat, sores in mouth, or a toothache) UNUSUAL RASH, SWELLING OR PAIN  UNUSUAL VAGINAL DISCHARGE OR ITCHING   Items with * indicate a potential emergency and should be followed up as soon as possible or go to the Emergency Department if any problems should occur.  Please show the CHEMOTHERAPY ALERT CARD or IMMUNOTHERAPY ALERT CARD at check-in to  the Emergency Department and triage nurse.  Should you have questions after your visit or need to cancel or reschedule your appointment, please contact [facility] [place] [facility] [place]  [phone] and follow the prompts.  Office hours are 8:00 a.m. to 4:30 p.m. Monday - Friday. Please note that voicemails left after 4:00 p.m. may not be returned until the following business day.  We are closed weekends and major holidays. You have access to a nurse at all times for urgent questions. Please call the main number to the clinic [phone] and follow the prompts.  For any non-urgent questions, you may also contact your provider using MyChart. We now offer e-Visits for anyone 81 and older to request care online for non-urgent symptoms. For details visit [email or web address].   Also download the MyChart app! Go to the app store, search "MyChart", open the app, select , and log in with your MyChart username and password.  Due to Covid, a mask is required upon entering the hospital/clinic. If you do not have a mask, one will be given to you upon arrival. For doctor visits, patients may have 1 support person aged 83 or older with them. For treatment visits, patients cannot have anyone with them due to current Covid guidelines and our immunocompromised population.

## 2021-08-01 NOTE — Progress Notes (Signed)
[place] NOTE  Patient Care Team: Juline Patch, MD as PCP - General (Family Medicine) Vladimir Faster, [facility] (Inactive) (Pharmacist) Cammie Sickle, MD as Consulting Physician (Oncology)  CHIEF COMPLAINTS/PURPOSE OF CONSULTATION: Multiple myeloma  Oncology History Overview Note  # MULTIPLE MYELOMA  [FEB 2023-hypercalcemia status changes]-Active [bone lesions; hypercalcemia; anemia; No renal insuffiencey;Bone marrow-32% plasma cell-   STANDARD Cytogenetics].FEB 2023- S/p dexamethasone 20 mg q day x4.   # REV [15 mg -3 week-On and 1 week-OFF]-DARA; May 2023-discontinued rev 15 diarrhea/hypotension  # MAY 2023- dara-Rev 10 mg 3w/1w   # FEB 2023-hypercalcemia [[facility]-status post calcitonin; bisphosphonate]  BONE MARROW, ASPIRATE, CLOT, CORE:  -Hypercellular bone marrow with plasma cell neoplasm  -See comment   PERIPHERAL BLOOD:  -Normocytic-normochromic anemia   COMMENT:   The bone marrow is hypercellular for age with increased number of  atypical plasma cells representing 32% of all cells in the aspirate  associated with interstitial infiltrates and numerous variably sized  clusters in the clot/biopsy sections.  The plasma cells display weak  kappa light chain restriction consistent with plasma cell neoplasm.  Correlation with cytogenetic and FISH studies is recommended   MICROSCOPIC DESCRIPTION:   PERIPHERAL BLOOD SMEAR: The red blood cells display mild  anisopoikilocytosis with mild polychromasia.  The white blood cells are  normal number with scattered hypogranular neutrophils. An occasional  myelocyte and large atypical mononuclear cells are seen on scan. The  platelets are normal in number.    Multiple myeloma not having achieved remission ([place])  05/15/2021 Initial Diagnosis   Multiple myeloma not having achieved remission ([place])   06/27/2021 -  Chemotherapy   Patient is on Treatment Plan : MYELOMA Daratumumab SQ + Lenalidomide +  Dexamethasone (DaraRd) q28d      07/11/2021 Cancer Staging   Staging form: Plasma Cell Myeloma and Plasma Cell Disorders, AJCC 8th Edition - Clinical: Beta-2-microglobulin (mg/L): 2.6, Albumin (g/dL): 3.9, ISS: Stage I - Signed by Cammie Sickle, MD on 07/11/2021 Stage prefix: Initial diagnosis Beta 2 microglobulin range (mg/L): Less than 3.5 Albumin range (g/dL): Greater than or equal to 3.5      HISTORY OF PRESENTING ILLNESS: pt in a wheel chair; accompanied by her daughter.   Cristina Morgan 87 y.o.  female with  multiple myeloma currently on Rev-Dex is proceed with Dara SQ today.  States that she is feeling well. Feeling stronger than she has in the past but has continued fatigue. Starting to do some things around the house though small. No N/V/D. Reports small weight gain due to improved appetite.    Review of Systems  Constitutional:  Positive for malaise/fatigue. Negative for chills, diaphoresis, fever and weight loss.  HENT:  Negative for nosebleeds and sore throat.   Eyes:  Negative for double vision.  Respiratory:  Negative for cough, hemoptysis, sputum production, shortness of breath and wheezing.   Cardiovascular:  Negative for chest pain, palpitations, orthopnea and leg swelling.  Gastrointestinal:  Negative for abdominal pain, blood in stool, constipation, diarrhea, heartburn, melena, nausea and vomiting.  Genitourinary:  Negative for dysuria, frequency and urgency.  Musculoskeletal:  Positive for back pain and joint pain.  Skin: Negative.  Negative for itching and rash.  Neurological:  Negative for dizziness, tingling, focal weakness, weakness and headaches.  Endo/Heme/Allergies:  Does not bruise/bleed easily.  Psychiatric/Behavioral:  Negative for depression. The patient is not nervous/anxious and does not have insomnia.     MEDICAL HISTORY:  Past Medical History:  Diagnosis  Date   Anemia    CAD (coronary artery disease)    Cataracts, bilateral    Closed  compression fracture of first lumbar vertebra (HCC)    Closed compression fracture of second lumbar vertebra (HCC)    Diabetes mellitus without complication (HCC)    High cholesterol    History of pneumonia 2010   [place]   History of uterine fibroid    Hypercalcemia    Hyperlipidemia    Hypertension    Lytic bone lesions on xray    Multiple myeloma (HCC)    Osteoporosis    Primary osteoarthritis of right knee     SURGICAL HISTORY: Past Surgical History:  Procedure Laterality Date   CATARACT EXTRACTION Bilateral 2014   COLONOSCOPY  2011   cleared for 5 yrs- [facility]   ECTOPIC PREGNANCY SURGERY      SOCIAL HISTORY: Social History   Socioeconomic History   Marital status: Married    Spouse name: Not on file   Number of children: 2   Years of education: Not on file   Highest education level: 12th grade  Occupational History   Occupation: Retired  Tobacco Use   Smoking status: Never    Passive exposure: Never   Smokeless tobacco: Never   Tobacco comments:    smoking cessation materials not required  Vaping Use   Vaping Use: Never used  Substance and Sexual Activity   Alcohol use: No   Drug use: No   Sexual activity: Not Currently  Other Topics Concern   Not on file  Social History Narrative   [place] with husband; drives. Never smoked; no alcohol. Daughters live close.    Social Determinants of Health   Financial Resource Strain: Low Risk    Difficulty of Paying Living Expenses: Not very hard  Food Insecurity: No Food Insecurity   Worried About Charity fundraiser in the Last Year: Never true   Ran Out of Food in the Last Year: Never true  Transportation Needs: No Transportation Needs   Lack of Transportation (Medical): No   Lack of Transportation (Non-Medical): No  Physical Activity: Inactive   Days of Exercise per Week: 0 days   Minutes of Exercise per Session: 0 min  Stress: No Stress Concern Present   Feeling of Stress : Only a little   Social Connections: Moderately Integrated   Frequency of Communication with Friends and Family: Three times a week   Frequency of Social Gatherings with Friends and Family: Three times a week   Attends Religious Services: 1 to 4 times per year   Active Member of Clubs or Organizations: No   Attends Music therapist: Never   Marital Status: Married  Human resources officer Violence: Not At Risk   Fear of Current or Ex-Partner: No   Emotionally Abused: No   Physically Abused: No   Sexually Abused: No    FAMILY HISTORY: Family History  Problem Relation Age of Onset   Cancer Mother        breast   Heart disease Mother    Diabetes Father    Heart disease Father    Heart attack Father    Heart attack Brother    Diabetes Brother     ALLERGIES:  is allergic to latex and penicillins.  MEDICATIONS:  Current Outpatient Medications  Medication Sig Dispense Refill   acetaminophen (TYLENOL) 325 MG tablet Take 325 mg by mouth 2 (two) times daily. START 2 days prior to chemo infusion. Take For  2 days; Do NOT take on the day of infusion     acyclovir (ZOVIRAX) 400 MG tablet Take 1 tablet (400 mg total) by mouth 2 (two) times daily. 60 tablet 4   carvedilol (COREG) 3.125 MG tablet Take 1 tablet (3.125 mg total) by mouth 2 (two) times daily with a meal. 60 tablet 0   dexamethasone (DECADRON) 4 MG tablet Take 1 tablet (4 mg total) by mouth 2 (two) times daily. Start 2 days prior to infusion; Take it for 2 days. 60 tablet 3   diphenoxylate-atropine (LOMOTIL) 2.5-0.025 MG tablet Take 1 tablet by mouth 4 (four) times daily as needed for diarrhea or loose stools. Take it along with immodium 60 tablet 0   Glucerna (GLUCERNA) LIQD Take 237 mLs by mouth.     hydrOXYzine (ATARAX) 10 MG tablet Take 1 tablet (10 mg total) by mouth at bedtime as needed. 90 tablet 1   lenalidomide (REVLIMID) 10 MG capsule Take 1 capsule (10 mg total) by mouth daily. Take for 21 days, then hold for 7 days. Repeat every  28 days. 21 capsule 3   lovastatin (MEVACOR) 40 MG tablet Take 1 tablet (40 mg total) by mouth every evening. 90 tablet 1   metFORMIN (GLUCOPHAGE) 1000 MG tablet TAKE ONE TABLET BY MOUTH TWICE DAILY 180 tablet 0   montelukast (SINGULAIR) 10 MG tablet Take 1 tablet (10 mg total) by mouth daily. START 2 days prior to chemo infusion. Take For 2 days; Do NOT take on the day of infusion. 20 tablet 1   ondansetron (ZOFRAN) 4 MG tablet Take 1 tablet (4 mg total) by mouth every 6 (six) hours as needed for nausea. 30 tablet 1   pantoprazole (PROTONIX) 40 MG tablet Take 1 tablet (40 mg total) by mouth daily. 90 tablet 1   sertraline (ZOLOFT) 25 MG tablet Take 1 tablet (25 mg total) by mouth daily. 90 tablet 1   ACCU-CHEK GUIDE test strip USE 1 STRIP TO CHECK GLUCOSE ONCE DAILY AS DIRECTED     Accu-Chek Softclix Lancets lancets USE 1 TO CHECK GLUCOSE ONCE DAILY     baclofen (LIORESAL) 20 MG tablet Take 1 tablet (20 mg total) by mouth 2 (two) times daily. (Patient not taking: Reported on 08/01/2021) 30 tablet 0   BD PEN NEEDLE NANO 2ND GEN 32G X 4 MM MISC USE 1 ONCE DAILY     Blood Glucose Monitoring Suppl (ACCU-CHEK GUIDE) w/Device KIT USE AS DIRECTED TO CHECK GLUCOSE     Calcium Carbonate-Vit D-Min (CALCIUM 1200 PO) Take 1 capsule by mouth daily. (Patient not taking: Reported on 08/01/2021)     cholecalciferol (VITAMIN D3) 25 MCG (1000 UNIT) tablet Take 1,000 Units by mouth daily. (Patient not taking: Reported on 08/01/2021)     insulin glargine (LANTUS SOLOSTAR) 100 UNIT/ML Solostar Pen Inject 5 Units into the skin daily. (Patient taking differently: Inject 12 Units into the skin daily. 12 units every day except Wed and Thursday- 14 units) 15 mL PRN   losartan (COZAAR) 25 MG tablet TAKE ONE TABLET BY MOUTH EVERY EVENING (Patient not taking: Reported on 08/01/2021) 90 tablet 0   Multiple Vitamin (MULTIVITAMIN ADULT PO) Take by mouth. (Patient not taking: Reported on 07/11/2021)     Omega-3 Fatty Acids (FISH OIL)  1000 MG CAPS Take 1 capsule (1,000 mg total) by mouth daily. (Patient not taking: Reported on 07/11/2021) 100 capsule 3   potassium chloride SA (KLOR-CON M) 20 MEQ tablet Take 1 tablet (20 mEq total) by  mouth 2 (two) times daily for 14 days. (Patient not taking: Reported on 07/11/2021) 28 tablet 0   No current facility-administered medications for this visit.      Marland Kitchen  PHYSICAL EXAMINATION:  Vitals:   08/01/21 0855  BP: (!) 126/51  Pulse: 75  Resp: 14  Temp: (!) 97.4 F (36.3 C)  SpO2: 100%   Filed Weights   08/01/21 0855  Weight: 123 lb (55.8 kg)    Physical Exam Vitals and nursing note reviewed.  HENT:     Head: Normocephalic and atraumatic.     Mouth/Throat:     Pharynx: Oropharynx is clear.  Eyes:     Extraocular Movements: Extraocular movements intact.     Pupils: Pupils are equal, round, and reactive to light.  Cardiovascular:     Rate and Rhythm: Normal rate and regular rhythm.  Pulmonary:     Comments: Decreased breath sounds bilaterally.  Abdominal:     Palpations: Abdomen is soft.  Musculoskeletal:        General: Normal range of motion.     Cervical back: Normal range of motion.  Skin:    General: Skin is warm.  Neurological:     General: No focal deficit present.     Mental Status: She is alert and oriented to person, place, and time.  Psychiatric:        Behavior: Behavior normal.        Judgment: Judgment normal.     LABORATORY DATA:  I have reviewed the data as listed Lab Results  Component Value Date   WBC 5.3 08/01/2021   HGB 9.7 (L) 08/01/2021   HCT 29.9 (L) 08/01/2021   MCV 95.8 08/01/2021   PLT 344 08/01/2021   Recent Labs    02/01/21 1502 02/18/21 1615 04/15/21 1549 05/01/21 0320 07/18/21 1223 07/25/21 0923 08/01/21 0836  NA 138  --   --    < > 130* 132* 133*  K 4.3  --   --    < > 4.0 4.0 4.2  CL 101  --   --    < > 103 103 103  CO2 23  --   --    < > _0 GLUCOSE 178*  --   --    < > 137* 154* 132*  BUN 19  --   --     < > 32* 29* 23  CREATININE 0.84  --   --    < > 1.17* 0.97 1.11*  CALCIUM 10.7*   < >  --    < > 9.4 9.3 9.0  GFRNONAA  --   --   --    < > 46* 57* 49*  PROT  --   --   --    < > 6.6 6.7 6.7  ALBUMIN 4.3  --  4.4   < > 3.6 3.7 3.7  AST 16  --  17   < > _1 ALT 9  --  8   < > _2 ALKPHOS 102  --  108   < > 147* 144* 124  BILITOT 0.3  --  0.3   < > 0.7 0.5 0.6  BILIDIR 0.12  --  0.10  --   --   --   --    < > = values in this interval not displayed.    RADIOGRAPHIC STUDIES: I have personally reviewed the radiological images as listed and agreed with  the findings in the report. No results found.  No problem-specific Assessment & Plan notes found for this encounter.  Encounter Diagnosis  Name Primary?   Multiple myeloma not having achieved remission (HCC) Yes   Chronic. Glad she is feeling better. Proceeding with Dara SQ today. Labs obtained and reviewed- ok for treatment.   PLAN:  Dara SQ today Follow up in 1 week with MD, labs (CBC, CMP, MM panel, light chains), Dara SQ +- Zometa 3 mg, ?Revlimid as this was not given today   All questions were answered. The patient knows to call the clinic with any problems, questions or concerns.       Hughie Closs, PA-C 08/01/2021 1:56 PM

## 2021-08-06 DIAGNOSIS — E871 Hypo-osmolality and hyponatremia: Secondary | ICD-10-CM | POA: Diagnosis not present

## 2021-08-06 DIAGNOSIS — I10 Essential (primary) hypertension: Secondary | ICD-10-CM | POA: Diagnosis not present

## 2021-08-06 DIAGNOSIS — C9000 Multiple myeloma not having achieved remission: Secondary | ICD-10-CM | POA: Diagnosis not present

## 2021-08-06 DIAGNOSIS — M8458XD Pathological fracture in neoplastic disease, other specified site, subsequent encounter for fracture with routine healing: Secondary | ICD-10-CM | POA: Diagnosis not present

## 2021-08-06 DIAGNOSIS — I2510 Atherosclerotic heart disease of native coronary artery without angina pectoris: Secondary | ICD-10-CM | POA: Diagnosis not present

## 2021-08-07 ENCOUNTER — Other Ambulatory Visit: Payer: Self-pay

## 2021-08-07 ENCOUNTER — Encounter: Payer: Self-pay | Admitting: Family Medicine

## 2021-08-07 DIAGNOSIS — M8458XD Pathological fracture in neoplastic disease, other specified site, subsequent encounter for fracture with routine healing: Secondary | ICD-10-CM | POA: Diagnosis not present

## 2021-08-07 DIAGNOSIS — Z791 Long term (current) use of non-steroidal anti-inflammatories (NSAID): Secondary | ICD-10-CM | POA: Diagnosis not present

## 2021-08-07 DIAGNOSIS — E538 Deficiency of other specified B group vitamins: Secondary | ICD-10-CM | POA: Diagnosis not present

## 2021-08-07 DIAGNOSIS — I10 Essential (primary) hypertension: Secondary | ICD-10-CM | POA: Diagnosis not present

## 2021-08-07 DIAGNOSIS — Z9181 History of falling: Secondary | ICD-10-CM | POA: Diagnosis not present

## 2021-08-07 DIAGNOSIS — Z7982 Long term (current) use of aspirin: Secondary | ICD-10-CM | POA: Diagnosis not present

## 2021-08-07 DIAGNOSIS — E119 Type 2 diabetes mellitus without complications: Secondary | ICD-10-CM | POA: Diagnosis not present

## 2021-08-07 DIAGNOSIS — I2510 Atherosclerotic heart disease of native coronary artery without angina pectoris: Secondary | ICD-10-CM | POA: Diagnosis not present

## 2021-08-07 DIAGNOSIS — E871 Hypo-osmolality and hyponatremia: Secondary | ICD-10-CM | POA: Diagnosis not present

## 2021-08-07 DIAGNOSIS — F419 Anxiety disorder, unspecified: Secondary | ICD-10-CM | POA: Diagnosis not present

## 2021-08-07 DIAGNOSIS — C9000 Multiple myeloma not having achieved remission: Secondary | ICD-10-CM | POA: Diagnosis not present

## 2021-08-07 DIAGNOSIS — Z7984 Long term (current) use of oral hypoglycemic drugs: Secondary | ICD-10-CM | POA: Diagnosis not present

## 2021-08-07 DIAGNOSIS — E7800 Pure hypercholesterolemia, unspecified: Secondary | ICD-10-CM | POA: Diagnosis not present

## 2021-08-07 DIAGNOSIS — Z794 Long term (current) use of insulin: Secondary | ICD-10-CM | POA: Diagnosis not present

## 2021-08-07 DIAGNOSIS — Z7983 Long term (current) use of bisphosphonates: Secondary | ICD-10-CM | POA: Diagnosis not present

## 2021-08-07 MED ORDER — LANTUS SOLOSTAR 100 UNIT/ML ~~LOC~~ SOPN: 14.0000 [IU] | PEN_INJECTOR | Freq: Every day | SUBCUTANEOUS | 0 refills | Status: DC

## 2021-08-08 ENCOUNTER — Encounter: Payer: Self-pay | Admitting: Internal Medicine

## 2021-08-08 ENCOUNTER — Inpatient Hospital Stay (HOSPITAL_BASED_OUTPATIENT_CLINIC_OR_DEPARTMENT_OTHER): Payer: Medicare Other | Admitting: Internal Medicine

## 2021-08-08 ENCOUNTER — Inpatient Hospital Stay: Payer: Medicare Other

## 2021-08-08 VITALS — BP 154/68 | HR 71 | Temp 97.8°F | Ht 66.0 in | Wt 123.6 lb

## 2021-08-08 DIAGNOSIS — I959 Hypotension, unspecified: Secondary | ICD-10-CM | POA: Diagnosis not present

## 2021-08-08 DIAGNOSIS — C9000 Multiple myeloma not having achieved remission: Secondary | ICD-10-CM

## 2021-08-08 DIAGNOSIS — R197 Diarrhea, unspecified: Secondary | ICD-10-CM | POA: Diagnosis not present

## 2021-08-08 DIAGNOSIS — D649 Anemia, unspecified: Secondary | ICD-10-CM | POA: Diagnosis not present

## 2021-08-08 DIAGNOSIS — Z5112 Encounter for antineoplastic immunotherapy: Secondary | ICD-10-CM | POA: Diagnosis not present

## 2021-08-08 LAB — COMPREHENSIVE METABOLIC PANEL
ALT: 11 U/L (ref 0–44)
AST: 20 U/L (ref 15–41)
Albumin: 3.8 g/dL (ref 3.5–5.0)
Alkaline Phosphatase: 128 U/L — ABNORMAL HIGH (ref 38–126)
Anion gap: 7 (ref 5–15)
BUN: 19 mg/dL (ref 8–23)
CO2: 24 mmol/L (ref 22–32)
Calcium: 8.8 mg/dL — ABNORMAL LOW (ref 8.9–10.3)
Chloride: 105 mmol/L (ref 98–111)
Creatinine, Ser: 1.05 mg/dL — ABNORMAL HIGH (ref 0.44–1.00)
GFR, Estimated: 52 mL/min — ABNORMAL LOW (ref >60)
Glucose, Bld: 201 mg/dL — ABNORMAL HIGH (ref 70–99)
Potassium: 3.8 mmol/L (ref 3.5–5.1)
Sodium: 136 mmol/L (ref 135–145)
Total Bilirubin: 0.3 mg/dL (ref 0.3–1.2)
Total Protein: 6.9 g/dL (ref 6.5–8.1)

## 2021-08-08 LAB — CBC WITH DIFFERENTIAL/PLATELET
Abs Immature Granulocytes: 0.01 10*3/uL (ref 0.00–0.07)
Basophils Absolute: 0 10*3/uL (ref 0.0–0.1)
Basophils Relative: 0 %
Eosinophils Absolute: 0 10*3/uL (ref 0.0–0.5)
Eosinophils Relative: 0 %
HCT: 30.8 % — ABNORMAL LOW (ref 36.0–46.0)
Hemoglobin: 9.7 g/dL — ABNORMAL LOW (ref 12.0–15.0)
Immature Granulocytes: 0 %
Lymphocytes Relative: 34 %
Lymphs Abs: 2.3 10*3/uL (ref 0.7–4.0)
MCH: 30.7 pg (ref 26.0–34.0)
MCHC: 31.5 g/dL (ref 30.0–36.0)
MCV: 97.5 fL (ref 80.0–100.0)
Monocytes Absolute: 0.5 10*3/uL (ref 0.1–1.0)
Monocytes Relative: 7 %
Neutro Abs: 3.9 10*3/uL (ref 1.7–7.7)
Neutrophils Relative %: 59 %
Platelets: 370 10*3/uL (ref 150–400)
RBC: 3.16 MIL/uL — ABNORMAL LOW (ref 3.87–5.11)
RDW: 16.2 % — ABNORMAL HIGH (ref 11.5–15.5)
WBC: 6.6 10*3/uL (ref 4.0–10.5)
nRBC: 0 % (ref 0.0–0.2)

## 2021-08-08 MED ORDER — DEXAMETHASONE 4 MG PO TABS
20.0000 mg | ORAL_TABLET | Freq: Once | ORAL | Status: AC
  Administered 2021-08-08: 20 mg via ORAL
  Filled 2021-08-08: qty 5

## 2021-08-08 MED ORDER — DARATUMUMAB-HYALURONIDASE-FIHJ 1800-30000 MG-UT/15ML ~~LOC~~ SOLN
1800.0000 mg | Freq: Once | SUBCUTANEOUS | Status: AC
  Administered 2021-08-08: 1800 mg via SUBCUTANEOUS
  Filled 2021-08-08: qty 15

## 2021-08-08 MED ORDER — ACETAMINOPHEN 325 MG PO TABS
650.0000 mg | ORAL_TABLET | Freq: Once | ORAL | Status: AC
  Administered 2021-08-08: 650 mg via ORAL
  Filled 2021-08-08: qty 2

## 2021-08-08 MED ORDER — DIPHENHYDRAMINE HCL 25 MG PO CAPS
50.0000 mg | ORAL_CAPSULE | Freq: Once | ORAL | Status: AC
  Administered 2021-08-08: 50 mg via ORAL
  Filled 2021-08-08: qty 2

## 2021-08-08 NOTE — Assessment & Plan Note (Addendum)
#  STAGE I- MULTIPLE MYELOMA -Active [bone lesions; hypercalcemia; anemia; No renal insuffiencey;Bone marrow-32% plasma cell-   STANDARD Cytogenetics].  Currently on Revlimid-Dex- Dara SQ. Partial response noted; April 2023-M protein = 0.5 gm/fl; K/L= 1. 56-N.   #Proceed with Dara SQ today; Labs today reviewed;  acceptable for treatment today.  Recommend we will start Revlimid at 10 mg dose 3 weeks on 1 week off.  Recommend daughter call to refill the prescription.  # Diarrhea- 10 loose BM/yesterday- likely sec to Revlimid- improved. . Awaiting to re-start  at lower dose 10 mg- 3 weeks on and 1 wek off/see above.  # Diabetes-on oral hypoglycemic agents; BG-201  on long acting insulin; 14 units of lantus on T/W/Thursday. Discussed with Dr.Jones.   # HTN: continue to Hold OFF losartan; continue coreg; check BP at home; bring log to next visit.   #Hypercalcemia secondary to multiple myeloma- [FEB 2023-Zometa; calcitonin]-calcium today is 9.26   #DVT/shingles prophylaxis: Aspirin/acyclovir. STABLE  # IV access:PIV   [name]- [id number]  zometa-q 4w; MM; K/L q 4 W  # DISPOSITION:  # proceed with  dara SQ today # follow up in 2 weeks- MD; labs- cbc/cmp;Dara SQ;Dr.B

## 2021-08-08 NOTE — Patient Instructions (Signed)
[facility]  Discharge Instructions: ?Thank you for choosing [facility] to provide your oncology and hematology care.  ?If you have a lab appointment with the Cancer Center, please go directly to the Cancer Center and check in at the registration area. ? ?Wear comfortable clothing and clothing appropriate for easy access to any Portacath or PICC line.  ? ?We strive to give you quality time with your provider. You may need to reschedule your appointment if you arrive late (15 or more minutes).  Arriving late affects you and other patients whose appointments are after yours.  Also, if you miss three or more appointments without notifying the office, you may be dismissed from the clinic at the provider?s discretion.    ?  ?For prescription refill requests, have your pharmacy contact our office and allow 72 hours for refills to be completed.   ? ?Today you received the following chemotherapy and/or immunotherapy agents DARZALEX    ?  ?To help prevent nausea and vomiting after your treatment, we encourage you to take your nausea medication as directed. ? ?BELOW ARE SYMPTOMS THAT SHOULD BE REPORTED IMMEDIATELY: ?*FEVER GREATER THAN 100.4 F (38 ?C) OR HIGHER ?*CHILLS OR SWEATING ?*NAUSEA AND VOMITING THAT IS NOT CONTROLLED WITH YOUR NAUSEA MEDICATION ?*UNUSUAL SHORTNESS OF BREATH ?*UNUSUAL BRUISING OR BLEEDING ?*URINARY PROBLEMS (pain or burning when urinating, or frequent urination) ?*BOWEL PROBLEMS (unusual diarrhea, constipation, pain near the anus) ?TENDERNESS IN MOUTH AND THROAT WITH OR WITHOUT PRESENCE OF ULCERS (sore throat, sores in mouth, or a toothache) ?UNUSUAL RASH, SWELLING OR PAIN  ?UNUSUAL VAGINAL DISCHARGE OR ITCHING  ? ?Items with * indicate a potential emergency and should be followed up as soon as possible or go to the Emergency Department if any problems should occur. ? ?Please show the CHEMOTHERAPY ALERT CARD or IMMUNOTHERAPY ALERT CARD at check-in to  the Emergency Department and triage nurse. ? ?Should you have questions after your visit or need to cancel or reschedule your appointment, please contact [facility]  [phone] and follow the prompts.  Office hours are 8:00 a.m. to 4:30 p.m. Monday - Friday. Please note that voicemails left after 4:00 p.m. may not be returned until the following business day.  We are closed weekends and major holidays. You have access to a nurse at all times for urgent questions. Please call the main number to the clinic [phone] and follow the prompts. ? ?For any non-urgent questions, you may also contact your provider using MyChart. We now offer e-Visits for anyone 18 and older to request care online for non-urgent symptoms. For details visit [email or web address]. ?  ?Also download the MyChart app! Go to the app store, search "MyChart", open the app, select [facility], and log in with your MyChart username and password. ? ?Due to Covid, a mask is required upon entering the hospital/clinic. If you do not have a mask, one will be given to you upon arrival. For doctor visits, patients may have 1 support person aged 18 or older with them. For treatment visits, patients cannot have anyone with them due to current Covid guidelines and our immunocompromised population.  ? ?Daratumumab injection ?What is this medication? ?DARATUMUMAB (dar a toom ue mab) is a monoclonal antibody. It is used to treat multiple myeloma. ?This medicine may be used for other purposes; ask your health care provider or pharmacist if you have questions. ?COMMON BRAND NAME(S): DARZALEX ?What should I tell my care team before I   take this medication? ?They need to know if you have any of these conditions: ?hereditary fructose intolerance ?infection (especially a virus infection such as chickenpox, herpes, or hepatitis B virus) ?lung or breathing disease (asthma, COPD) ?an unusual or allergic reaction to daratumumab,  sorbitol, other medicines, foods, dyes, or preservatives ?pregnant or trying to get pregnant ?breast-feeding ?How should I use this medication? ?This medicine is for infusion into a vein. It is given by a health care professional in a hospital or clinic setting. ?Talk to your pediatrician regarding the use of this medicine in children. Special care may be needed. ?Overdosage: If you think you have taken too much of this medicine contact a poison control center or emergency room at once. ?NOTE: This medicine is only for you. Do not share this medicine with others. ?What if I miss a dose? ?Keep appointments for follow-up doses as directed. It is important not to miss your dose. Call your doctor or health care professional if you are unable to keep an appointment. ?What may interact with this medication? ?Interactions have not been studied. ?This list may not describe all possible interactions. Give your health care provider a list of all the medicines, herbs, non-prescription drugs, or dietary supplements you use. Also tell them if you smoke, drink alcohol, or use illegal drugs. Some items may interact with your medicine. ?What should I watch for while using this medication? ?Your condition will be monitored carefully while you are receiving this medicine. ?This medicine can cause serious allergic reactions. To reduce your risk, your health care provider may give you other medicine to take before receiving this one. Be sure to follow the directions from your health care provider. ?This medicine can affect the results of blood tests to match your blood type. These changes can last for up to 6 months after the final dose. Your healthcare provider will do blood tests to match your blood type before you start treatment. Tell all of your healthcare providers that you are being treated with this medicine before receiving a blood transfusion. ?This medicine can affect the results of some tests used to determine treatment  response; extra tests may be needed to evaluate response. ?Do not become pregnant while taking this medicine or for 3 months after stopping it. Women should inform their health care provider if they wish to become pregnant or think they might be pregnant. There is a potential for serious side effects to an unborn child. Talk to your health care provider for more information. Do not breast-feed an infant while taking this medicine. ?What side effects may I notice from receiving this medication? ?Side effects that you should report to your care team as soon as possible: ?Allergic reactions--skin rash, itching or hives, swelling of the face, lips, or tongue ?Blurred vision ?Infection--fever, chills, cough, sore throat, pain or trouble passing urine ?Infusion reactions--dizziness, fast heartbeat, feeling faint or lightheaded, falls, headache, increase in blood pressure, nausea, vomiting, or wheezing or trouble breathing with loud or whistling sounds ?Unusual bleeding or bruising ?Side effects that usually do not require medical attention (report to your care team if they continue or are bothersome): ?Constipation ?Diarrhea ?Pain, tingling, numbness in the hands or feet ?Swelling of the ankles, feet, hands ?Tiredness ?This list may not describe all possible side effects. Call your doctor for medical advice about side effects. You may report side effects to FDA at 1-800-FDA-1088. ?Where should I keep my medication? ?This drug is given in a hospital or clinic and will not   be stored at home. NOTE: This sheet is a summary. It may not cover all possible information. If you have questions about this medicine, talk to your doctor, pharmacist, or health care provider.  2023 Elsevier/Gold Standard (2020-08-09 00:00:00)

## 2021-08-08 NOTE — Progress Notes (Signed)
[place] NOTE  Patient Care Team: Juline Patch, MD as PCP - General (Family Medicine) Vladimir Faster, [facility] (Inactive) (Pharmacist) Cammie Sickle, MD as Consulting Physician (Oncology)  CHIEF COMPLAINTS/PURPOSE OF CONSULTATION: Multiple myeloma  Oncology History Overview Note  # MULTIPLE MYELOMA  [FEB 2023-hypercalcemia status changes]-Active [bone lesions; hypercalcemia; anemia; No renal insuffiencey;Bone marrow-32% plasma cell-   STANDARD Cytogenetics].FEB 2023- S/p dexamethasone 20 mg q day x4.   # REV [15 mg -3 week-On and 1 week-OFF]-DARA; May 2023-discontinued rev 15 diarrhea/hypotension  # MAY 2023- dara-Rev 10 mg 3w/1w   # FEB 2023-hypercalcemia [[facility]-status post calcitonin; bisphosphonate]  BONE MARROW, ASPIRATE, CLOT, CORE:  -Hypercellular bone marrow with plasma cell neoplasm  -See comment   PERIPHERAL BLOOD:  -Normocytic-normochromic anemia   COMMENT:   The bone marrow is hypercellular for age with increased number of  atypical plasma cells representing 32% of all cells in the aspirate  associated with interstitial infiltrates and numerous variably sized  clusters in the clot/biopsy sections.  The plasma cells display weak  kappa light chain restriction consistent with plasma cell neoplasm.  Correlation with cytogenetic and FISH studies is recommended   MICROSCOPIC DESCRIPTION:   PERIPHERAL BLOOD SMEAR: The red blood cells display mild  anisopoikilocytosis with mild polychromasia.  The white blood cells are  normal number with scattered hypogranular neutrophils. An occasional  myelocyte and large atypical mononuclear cells are seen on scan. The  platelets are normal in number.    Multiple myeloma not having achieved remission ([place])  05/15/2021 Initial Diagnosis   Multiple myeloma not having achieved remission ([place])   06/27/2021 -  Chemotherapy   Patient is on Treatment Plan : MYELOMA Daratumumab SQ + Lenalidomide +  Dexamethasone (DaraRd) q28d      07/11/2021 Cancer Staging   Staging form: Plasma Cell Myeloma and Plasma Cell Disorders, AJCC 8th Edition - Clinical: Beta-2-microglobulin (mg/L): 2.6, Albumin (g/dL): 3.9, ISS: Stage I - Signed by Cammie Sickle, MD on 07/11/2021 Stage prefix: Initial diagnosis Beta 2 microglobulin range (mg/L): Less than 3.5 Albumin range (g/dL): Greater than or equal to 3.5      HISTORY OF PRESENTING ILLNESS: pt in a wheel chair; accompanied by her daughter.   Cristina Morgan 87 y.o.  female with  multiple myeloma currently on Rev-Dex is proceed with Dara SQ today.  Revlimid was held appx 4 weeks ago because of diarrhea.  Currently resolved after stopping Revlimid.  Her appetite is better.  Blood sugars are better controlled.  She is gaining weight.  Patient states her back pain is improved.  Denies any worsening joint pains.  No falls.  No further episodes of mental status changes.    Review of Systems  Constitutional:  Positive for malaise/fatigue and weight loss. Negative for chills, diaphoresis and fever.  HENT:  Negative for nosebleeds and sore throat.   Eyes:  Negative for double vision.  Respiratory:  Negative for cough, hemoptysis, sputum production, shortness of breath and wheezing.   Cardiovascular:  Negative for chest pain, palpitations, orthopnea and leg swelling.  Gastrointestinal:  Negative for abdominal pain, blood in stool, constipation, diarrhea, heartburn, melena, nausea and vomiting.  Genitourinary:  Negative for dysuria, frequency and urgency.  Musculoskeletal:  Positive for back pain and joint pain.  Skin: Negative.  Negative for itching and rash.  Neurological:  Negative for dizziness, tingling, focal weakness, weakness and headaches.  Endo/Heme/Allergies:  Does not bruise/bleed easily.  Psychiatric/Behavioral:  Negative for depression. The patient  is not nervous/anxious and does not have insomnia.     MEDICAL HISTORY:  Past Medical  History:  Diagnosis Date   Anemia    CAD (coronary artery disease)    Cataracts, bilateral    Closed compression fracture of first lumbar vertebra (HCC)    Closed compression fracture of second lumbar vertebra (HCC)    Diabetes mellitus without complication (HCC)    High cholesterol    History of pneumonia 2010   [place]   History of uterine fibroid    Hypercalcemia    Hyperlipidemia    Hypertension    Lytic bone lesions on xray    Multiple myeloma (HCC)    Osteoporosis    Primary osteoarthritis of right knee     SURGICAL HISTORY: Past Surgical History:  Procedure Laterality Date   CATARACT EXTRACTION Bilateral 2014   COLONOSCOPY  2011   cleared for 5 yrs- [facility]   ECTOPIC PREGNANCY SURGERY      SOCIAL HISTORY: Social History   Socioeconomic History   Marital status: Married    Spouse name: Not on file   Number of children: 2   Years of education: Not on file   Highest education level: 12th grade  Occupational History   Occupation: Retired  Tobacco Use   Smoking status: Never    Passive exposure: Never   Smokeless tobacco: Never   Tobacco comments:    smoking cessation materials not required  Vaping Use   Vaping Use: Never used  Substance and Sexual Activity   Alcohol use: No   Drug use: No   Sexual activity: Not Currently  Other Topics Concern   Not on file  Social History Narrative   [place] with husband; drives. Never smoked; no alcohol. Daughters live close.    Social Determinants of Health   Financial Resource Strain: Low Risk    Difficulty of Paying Living Expenses: Not very hard  Food Insecurity: No Food Insecurity   Worried About Charity fundraiser in the Last Year: Never true   Ran Out of Food in the Last Year: Never true  Transportation Needs: No Transportation Needs   Lack of Transportation (Medical): No   Lack of Transportation (Non-Medical): No  Physical Activity: Inactive   Days of Exercise per Week: 0 days   Minutes of  Exercise per Session: 0 min  Stress: No Stress Concern Present   Feeling of Stress : Only a little  Social Connections: Moderately Integrated   Frequency of Communication with Friends and Family: Three times a week   Frequency of Social Gatherings with Friends and Family: Three times a week   Attends Religious Services: 1 to 4 times per year   Active Member of Clubs or Organizations: No   Attends Music therapist: Never   Marital Status: Married  Human resources officer Violence: Not At Risk   Fear of Current or Ex-Partner: No   Emotionally Abused: No   Physically Abused: No   Sexually Abused: No    FAMILY HISTORY: Family History  Problem Relation Age of Onset   Cancer Mother        breast   Heart disease Mother    Diabetes Father    Heart disease Father    Heart attack Father    Heart attack Brother    Diabetes Brother     ALLERGIES:  is allergic to latex and penicillins.  MEDICATIONS:  Current Outpatient Medications  Medication Sig Dispense Refill   ACCU-CHEK GUIDE test  strip USE 1 STRIP TO CHECK GLUCOSE ONCE DAILY AS DIRECTED     Accu-Chek Softclix Lancets lancets USE 1 TO CHECK GLUCOSE ONCE DAILY     acetaminophen (TYLENOL) 325 MG tablet Take 325 mg by mouth 2 (two) times daily. START 2 days prior to chemo infusion. Take For 2 days; Do NOT take on the day of infusion     acyclovir (ZOVIRAX) 400 MG tablet Take 1 tablet (400 mg total) by mouth 2 (two) times daily. 60 tablet 4   BD PEN NEEDLE NANO 2ND GEN 32G X 4 MM MISC USE 1 ONCE DAILY     Blood Glucose Monitoring Suppl (ACCU-CHEK GUIDE) w/Device KIT USE AS DIRECTED TO CHECK GLUCOSE     carvedilol (COREG) 3.125 MG tablet Take 1 tablet (3.125 mg total) by mouth 2 (two) times daily with a meal. 60 tablet 0   dexamethasone (DECADRON) 4 MG tablet Take 1 tablet (4 mg total) by mouth 2 (two) times daily. Start 2 days prior to infusion; Take it for 2 days. 60 tablet 3   diphenoxylate-atropine (LOMOTIL) 2.5-0.025 MG tablet  Take 1 tablet by mouth 4 (four) times daily as needed for diarrhea or loose stools. Take it along with immodium 60 tablet 0   Glucerna (GLUCERNA) LIQD Take 237 mLs by mouth.     hydrOXYzine (ATARAX) 10 MG tablet Take 1 tablet (10 mg total) by mouth at bedtime as needed. 90 tablet 1   insulin glargine (LANTUS SOLOSTAR) 100 UNIT/ML Solostar Pen Inject 14 Units into the skin daily. 12 units every day except Wed and Thursday- 14 units 3 mL 0   lenalidomide (REVLIMID) 10 MG capsule Take 1 capsule (10 mg total) by mouth daily. Take for 21 days, then hold for 7 days. Repeat every 28 days. 21 capsule 3   lovastatin (MEVACOR) 40 MG tablet Take 1 tablet (40 mg total) by mouth every evening. 90 tablet 1   metFORMIN (GLUCOPHAGE) 1000 MG tablet TAKE ONE TABLET BY MOUTH TWICE DAILY 180 tablet 0   montelukast (SINGULAIR) 10 MG tablet Take 1 tablet (10 mg total) by mouth daily. START 2 days prior to chemo infusion. Take For 2 days; Do NOT take on the day of infusion. 20 tablet 1   ondansetron (ZOFRAN) 4 MG tablet Take 1 tablet (4 mg total) by mouth every 6 (six) hours as needed for nausea. 30 tablet 1   pantoprazole (PROTONIX) 40 MG tablet Take 1 tablet (40 mg total) by mouth daily. 90 tablet 1   sertraline (ZOLOFT) 25 MG tablet Take 1 tablet (25 mg total) by mouth daily. 90 tablet 1   baclofen (LIORESAL) 20 MG tablet Take 1 tablet (20 mg total) by mouth 2 (two) times daily. (Patient not taking: Reported on 08/01/2021) 30 tablet 0   Calcium Carbonate-Vit D-Min (CALCIUM 1200 PO) Take 1 capsule by mouth daily. (Patient not taking: Reported on 08/01/2021)     cholecalciferol (VITAMIN D3) 25 MCG (1000 UNIT) tablet Take 1,000 Units by mouth daily. (Patient not taking: Reported on 08/01/2021)     losartan (COZAAR) 25 MG tablet TAKE ONE TABLET BY MOUTH EVERY EVENING (Patient not taking: Reported on 08/01/2021) 90 tablet 0   Multiple Vitamin (MULTIVITAMIN ADULT PO) Take by mouth. (Patient not taking: Reported on 07/11/2021)      Omega-3 Fatty Acids (FISH OIL) 1000 MG CAPS Take 1 capsule (1,000 mg total) by mouth daily. (Patient not taking: Reported on 07/11/2021) 100 capsule 3   potassium chloride SA (KLOR-CON M)  20 MEQ tablet Take 1 tablet (20 mEq total) by mouth 2 (two) times daily for 14 days. (Patient not taking: Reported on 07/11/2021) 28 tablet 0   No current facility-administered medications for this visit.      Marland Kitchen  PHYSICAL EXAMINATION:  Vitals:   08/08/21 0941  BP: (!) 154/68  Pulse: 71  Temp: 97.8 F (36.6 C)  SpO2: 100%   Filed Weights   08/08/21 0941  Weight: 123 lb 9.6 oz (56.1 kg)    Physical Exam Vitals and nursing note reviewed.  HENT:     Head: Normocephalic and atraumatic.     Mouth/Throat:     Pharynx: Oropharynx is clear.  Eyes:     Extraocular Movements: Extraocular movements intact.     Pupils: Pupils are equal, round, and reactive to light.  Cardiovascular:     Rate and Rhythm: Normal rate and regular rhythm.  Pulmonary:     Comments: Decreased breath sounds bilaterally.  Abdominal:     Palpations: Abdomen is soft.  Musculoskeletal:        General: Normal range of motion.     Cervical back: Normal range of motion.  Skin:    General: Skin is warm.  Neurological:     General: No focal deficit present.     Mental Status: She is alert and oriented to person, place, and time.  Psychiatric:        Behavior: Behavior normal.        Judgment: Judgment normal.     LABORATORY DATA:  I have reviewed the data as listed Lab Results  Component Value Date   WBC 6.6 08/08/2021   HGB 9.7 (L) 08/08/2021   HCT 30.8 (L) 08/08/2021   MCV 97.5 08/08/2021   PLT 370 08/08/2021   Recent Labs    02/01/21 1502 02/18/21 1615 04/15/21 1549 05/01/21 0320 07/25/21 0923 08/01/21 0836 08/08/21 0931  NA 138  --   --    < > 132* 133* 136  K 4.3  --   --    < > 4.0 4.2 3.8  CL 101  --   --    < > 103 103 105  CO2 23  --   --    < > '23 23 24  ' GLUCOSE 178*  --   --    < > 154* 132*  201*  BUN 19  --   --    < > 29* 23 19  CREATININE 0.84  --   --    < > 0.97 1.11* 1.05*  CALCIUM 10.7*   < >  --    < > 9.3 9.0 8.8*  GFRNONAA  --   --   --    < > 57* 49* 52*  PROT  --   --   --    < > 6.7 6.7 6.9  ALBUMIN 4.3  --  4.4   < > 3.7 3.7 3.8  AST 16  --  17   < > '20 21 20  ' ALT 9  --  8   < > '14 12 11  ' ALKPHOS 102  --  108   < > 144* 124 128*  BILITOT 0.3  --  0.3   < > 0.5 0.6 0.3  BILIDIR 0.12  --  0.10  --   --   --   --    < > = values in this interval not displayed.    RADIOGRAPHIC STUDIES: I have personally reviewed  the radiological images as listed and agreed with the findings in the report. No results found.  Multiple myeloma not having achieved remission ([place]) # STAGE I- MULTIPLE MYELOMA -Active [bone lesions; hypercalcemia; anemia; No renal insuffiencey;Bone marrow-32% plasma cell-   STANDARD Cytogenetics].  Currently on Revlimid-Dex- Dara SQ. Partial response noted; April 2023-M protein = 0.5 gm/fl; K/L= 1. 56-N.   #Proceed with Dara SQ today; Labs today reviewed;  acceptable for treatment today.  Recommend we will start Revlimid at 10 mg dose 3 weeks on 1 week off.  Recommend daughter call to refill the prescription.  # Diarrhea- 10 loose BM/yesterday- likely sec to Revlimid- improved. . Awaiting to re-start  at lower dose 10 mg- 3 weeks on and 1 wek off/see above.  # Diabetes-on oral hypoglycemic agents; BG-201  on long acting insulin; 14 units of lantus on T/W/Thursday. Discussed with Dr.Jones.   # HTN: continue to Hold OFF losartan; continue coreg; check BP at home; bring log to next visit.   #Hypercalcemia secondary to multiple myeloma- [FEB 2023-Zometa; calcitonin]-calcium today is 9.26   #DVT/shingles prophylaxis: Aspirin/acyclovir. STABLE  # IV access:PIV   [name]- [phone]  zometa-q 4w; MM; K/L q 4 W  # DISPOSITION:  # proceed with  dara SQ today # follow up in 2 weeks- MD; labs- cbc/cmp;Dara SQ;Dr.B      All questions were  answered. The patient knows to call the clinic with any problems, questions or concerns.       Cammie Sickle, MD 08/08/2021 8:26 PM

## 2021-08-09 LAB — KAPPA/LAMBDA LIGHT CHAINS
Kappa free light chain: 13 mg/L (ref 3.3–19.4)
Kappa, lambda light chain ratio: 1.97 — ABNORMAL HIGH (ref 0.26–1.65)
Lambda free light chains: 6.6 mg/L (ref 5.7–26.3)

## 2021-08-13 DIAGNOSIS — C9000 Multiple myeloma not having achieved remission: Secondary | ICD-10-CM | POA: Diagnosis not present

## 2021-08-13 DIAGNOSIS — E871 Hypo-osmolality and hyponatremia: Secondary | ICD-10-CM | POA: Diagnosis not present

## 2021-08-13 DIAGNOSIS — M8458XD Pathological fracture in neoplastic disease, other specified site, subsequent encounter for fracture with routine healing: Secondary | ICD-10-CM | POA: Diagnosis not present

## 2021-08-13 DIAGNOSIS — I2510 Atherosclerotic heart disease of native coronary artery without angina pectoris: Secondary | ICD-10-CM | POA: Diagnosis not present

## 2021-08-13 DIAGNOSIS — I10 Essential (primary) hypertension: Secondary | ICD-10-CM | POA: Diagnosis not present

## 2021-08-13 LAB — MULTIPLE MYELOMA PANEL, SERUM
Albumin SerPl Elph-Mcnc: 3.8 g/dL (ref 2.9–4.4)
Albumin/Glob SerPl: 1.6 (ref 0.7–1.7)
Alpha 1: 0.2 g/dL (ref 0.0–0.4)
Alpha2 Glob SerPl Elph-Mcnc: 0.7 g/dL (ref 0.4–1.0)
B-Globulin SerPl Elph-Mcnc: 0.9 g/dL (ref 0.7–1.3)
Gamma Glob SerPl Elph-Mcnc: 0.8 g/dL (ref 0.4–1.8)
Globulin, Total: 2.5 g/dL (ref 2.2–3.9)
IgA: 50 mg/dL — ABNORMAL LOW (ref 64–422)
IgG (Immunoglobin G), Serum: 854 mg/dL (ref 586–1602)
IgM (Immunoglobulin M), Srm: 28 mg/dL (ref 26–217)
M Protein SerPl Elph-Mcnc: 0.5 g/dL — ABNORMAL HIGH
Total Protein ELP: 6.3 g/dL (ref 6.0–8.5)

## 2021-08-20 DIAGNOSIS — I2510 Atherosclerotic heart disease of native coronary artery without angina pectoris: Secondary | ICD-10-CM | POA: Diagnosis not present

## 2021-08-20 DIAGNOSIS — E871 Hypo-osmolality and hyponatremia: Secondary | ICD-10-CM | POA: Diagnosis not present

## 2021-08-20 DIAGNOSIS — I10 Essential (primary) hypertension: Secondary | ICD-10-CM | POA: Diagnosis not present

## 2021-08-20 DIAGNOSIS — C9000 Multiple myeloma not having achieved remission: Secondary | ICD-10-CM | POA: Diagnosis not present

## 2021-08-20 DIAGNOSIS — M8458XD Pathological fracture in neoplastic disease, other specified site, subsequent encounter for fracture with routine healing: Secondary | ICD-10-CM | POA: Diagnosis not present

## 2021-08-22 ENCOUNTER — Inpatient Hospital Stay: Payer: Medicare Other | Attending: Nurse Practitioner | Admitting: Internal Medicine

## 2021-08-22 ENCOUNTER — Other Ambulatory Visit: Payer: Self-pay

## 2021-08-22 ENCOUNTER — Inpatient Hospital Stay: Payer: Medicare Other

## 2021-08-22 ENCOUNTER — Encounter: Payer: Self-pay | Admitting: Internal Medicine

## 2021-08-22 DIAGNOSIS — R197 Diarrhea, unspecified: Secondary | ICD-10-CM | POA: Insufficient documentation

## 2021-08-22 DIAGNOSIS — E119 Type 2 diabetes mellitus without complications: Secondary | ICD-10-CM | POA: Diagnosis not present

## 2021-08-22 DIAGNOSIS — I10 Essential (primary) hypertension: Secondary | ICD-10-CM | POA: Insufficient documentation

## 2021-08-22 DIAGNOSIS — D649 Anemia, unspecified: Secondary | ICD-10-CM | POA: Diagnosis not present

## 2021-08-22 DIAGNOSIS — Z5112 Encounter for antineoplastic immunotherapy: Secondary | ICD-10-CM | POA: Insufficient documentation

## 2021-08-22 DIAGNOSIS — C9000 Multiple myeloma not having achieved remission: Secondary | ICD-10-CM

## 2021-08-22 LAB — COMPREHENSIVE METABOLIC PANEL
ALT: 13 U/L (ref 0–44)
AST: 23 U/L (ref 15–41)
Albumin: 3.8 g/dL (ref 3.5–5.0)
Alkaline Phosphatase: 98 U/L (ref 38–126)
Anion gap: 9 (ref 5–15)
BUN: 20 mg/dL (ref 8–23)
CO2: 26 mmol/L (ref 22–32)
Calcium: 9.7 mg/dL (ref 8.9–10.3)
Chloride: 100 mmol/L (ref 98–111)
Creatinine, Ser: 0.84 mg/dL (ref 0.44–1.00)
GFR, Estimated: >60 mL/min (ref >60)
Glucose, Bld: 205 mg/dL — ABNORMAL HIGH (ref 70–99)
Potassium: 3.9 mmol/L (ref 3.5–5.1)
Sodium: 135 mmol/L (ref 135–145)
Total Bilirubin: 0.6 mg/dL (ref 0.3–1.2)
Total Protein: 6.4 g/dL — ABNORMAL LOW (ref 6.5–8.1)

## 2021-08-22 LAB — CBC WITH DIFFERENTIAL/PLATELET
Abs Immature Granulocytes: 0.02 10*3/uL (ref 0.00–0.07)
Basophils Absolute: 0 10*3/uL (ref 0.0–0.1)
Basophils Relative: 0 %
Eosinophils Absolute: 0 10*3/uL (ref 0.0–0.5)
Eosinophils Relative: 0 %
HCT: 29.9 % — ABNORMAL LOW (ref 36.0–46.0)
Hemoglobin: 9.7 g/dL — ABNORMAL LOW (ref 12.0–15.0)
Immature Granulocytes: 0 %
Lymphocytes Relative: 38 %
Lymphs Abs: 2.6 10*3/uL (ref 0.7–4.0)
MCH: 32.1 pg (ref 26.0–34.0)
MCHC: 32.4 g/dL (ref 30.0–36.0)
MCV: 99 fL (ref 80.0–100.0)
Monocytes Absolute: 0.7 10*3/uL (ref 0.1–1.0)
Monocytes Relative: 11 %
Neutro Abs: 3.4 10*3/uL (ref 1.7–7.7)
Neutrophils Relative %: 51 %
Platelets: 216 10*3/uL (ref 150–400)
RBC: 3.02 MIL/uL — ABNORMAL LOW (ref 3.87–5.11)
RDW: 16.3 % — ABNORMAL HIGH (ref 11.5–15.5)
WBC: 6.7 10*3/uL (ref 4.0–10.5)
nRBC: 0 % (ref 0.0–0.2)

## 2021-08-22 MED ORDER — DARATUMUMAB-HYALURONIDASE-FIHJ 1800-30000 MG-UT/15ML ~~LOC~~ SOLN
1800.0000 mg | Freq: Once | SUBCUTANEOUS | Status: AC
  Administered 2021-08-22: 1800 mg via SUBCUTANEOUS
  Filled 2021-08-22: qty 15

## 2021-08-22 MED ORDER — DIPHENHYDRAMINE HCL 25 MG PO CAPS
50.0000 mg | ORAL_CAPSULE | Freq: Once | ORAL | Status: AC
  Administered 2021-08-22: 50 mg via ORAL
  Filled 2021-08-22: qty 2

## 2021-08-22 MED ORDER — ACETAMINOPHEN 325 MG PO TABS
650.0000 mg | ORAL_TABLET | Freq: Once | ORAL | Status: AC
  Administered 2021-08-22: 650 mg via ORAL
  Filled 2021-08-22: qty 2

## 2021-08-22 MED ORDER — DEXAMETHASONE 4 MG PO TABS
20.0000 mg | ORAL_TABLET | Freq: Once | ORAL | Status: AC
  Administered 2021-08-22: 20 mg via ORAL
  Filled 2021-08-22: qty 5

## 2021-08-22 NOTE — Progress Notes (Signed)
Patient denies new problems/concerns today.   °

## 2021-08-22 NOTE — Progress Notes (Signed)
Patient tolerated Dara injection well, no questions/concerns voiced. Patient stable at discharge. AVS given.

## 2021-08-22 NOTE — Patient Instructions (Signed)
[facility] CTR AT [place]  Discharge Instructions: Thank you for choosing [facility] to provide your oncology and hematology care.  If you have a lab appointment with the [facility], please go directly to the [facility] and check in at the registration area.  Wear comfortable clothing and clothing appropriate for easy access to any Portacath or PICC line.   We strive to give you quality time with your provider. You may need to reschedule your appointment if you arrive late (15 or more minutes).  Arriving late affects you and other patients whose appointments are after yours.  Also, if you miss three or more appointments without notifying the office, you may be dismissed from the clinic at the provider's discretion.      For prescription refill requests, have your pharmacy contact our office and allow 72 hours for refills to be completed.    Today you received the following chemotherapy and/or immunotherapy agents: DARZALEX FASPRO   To help prevent nausea and vomiting after your treatment, we encourage you to take your nausea medication as directed.  BELOW ARE SYMPTOMS THAT SHOULD BE REPORTED IMMEDIATELY: *FEVER GREATER THAN 100.4 F (38 C) OR HIGHER *CHILLS OR SWEATING *NAUSEA AND VOMITING THAT IS NOT CONTROLLED WITH YOUR NAUSEA MEDICATION *UNUSUAL SHORTNESS OF BREATH *UNUSUAL BRUISING OR BLEEDING *URINARY PROBLEMS (pain or burning when urinating, or frequent urination) *BOWEL PROBLEMS (unusual diarrhea, constipation, pain near the anus) TENDERNESS IN MOUTH AND THROAT WITH OR WITHOUT PRESENCE OF ULCERS (sore throat, sores in mouth, or a toothache) UNUSUAL RASH, SWELLING OR PAIN  UNUSUAL VAGINAL DISCHARGE OR ITCHING   Items with * indicate a potential emergency and should be followed up as soon as possible or go to the Emergency Department if any problems should occur.  Please show the CHEMOTHERAPY ALERT CARD or IMMUNOTHERAPY ALERT CARD at check-in  to the Emergency Department and triage nurse.  Should you have questions after your visit or need to cancel or reschedule your appointment, please contact [facility] CANCER [place] AT [place]  [phone] and follow the prompts.  Office hours are 8:00 a.m. to 4:30 p.m. Monday - Friday. Please note that voicemails left after 4:00 p.m. may not be returned until the following business day.  We are closed weekends and major holidays. You have access to a nurse at all times for urgent questions. Please call the main number to the clinic [phone] and follow the prompts.  For any non-urgent questions, you may also contact your provider using MyChart. We now offer e-Visits for anyone 57 and older to request care online for non-urgent symptoms. For details visit [email or web address].   Also download the MyChart app! Go to the app store, search "MyChart", open the app, select [facility], and log in with your MyChart username and password.  Due to Covid, a mask is required upon entering the hospital/clinic. If you do not have a mask, one will be given to you upon arrival. For doctor visits, patients may have 1 support person aged 60 or older with them. For treatment visits, patients cannot have anyone with them due to current Covid guidelines and our immunocompromised population.

## 2021-08-22 NOTE — Addendum Note (Signed)
Addended by: Leeann Must on: 08/22/2021 09:51 AM   Modules accepted: Orders

## 2021-08-22 NOTE — Progress Notes (Signed)
Nutrition Follow-up:  Patient with multiple myeloma, currently on chemotherapy.  Revlimid restarted at lower dose.   Met with patient during infusion. Reports that her appetite is ok.  She is trying to continue to eat.  Says diarrhea is better.  Drinking 3 glucerna shakes each day.  Eating 3 meals per day. Husband and daughter are helping prepare meals as well as friends.    Medications: reviewed  Labs: glucose 205  Anthropometrics:   Weight 124 lb 11.2 oz today  118 lb 4.8 oz on 5/4  129 lb 4/13   NUTRITION DIAGNOSIS: Inadequate oral intake improving with weight gain   INTERVENTION:  Encouraged patient to "nibble" including foods high in protein Continue glucerna shakes 3 times per day    MONITORING, EVALUATION, GOAL: weight trends, intake   NEXT VISIT: ~4-6 weeks during treatment  Jarriel Papillion B. [name], [place], [place] Registered Dietitian [phone]

## 2021-08-22 NOTE — Assessment & Plan Note (Addendum)
#  STAGE I- MULTIPLE MYELOMA -Active [bone lesions; hypercalcemia; anemia; No renal insuffiencey;Bone marrow-32% plasma cell-   STANDARD Cytogenetics].  Currently on Revlimid-Dex- Dara SQ. Partial response noted; MAY 25th 2023-M protein = 0.5 gm/fl; K/L= 1. 9.   #Proceed with Dara SQ today; Labs today reviewed;  acceptable for treatment today. On Revlimid at 10 mg dose 3 weeks on 1 week off. Keep dex 8 mg weekly.    # Diarrhea- improved on Revlimid 10 mg a day.  # Diabetes-on oral hypoglycemic agents; BG-201  on long acting insulin; 14 units of lantus on T/W/Thursday.  Stable.  # HTN: continue to Hold OFF losartan; continue coreg; check BP at home; bring log to next visit.   #Hypercalcemia secondary to multiple myeloma- [FEB 2023-Zometa; calcitonin]-calcium today is 9.26   #DVT/shingles prophylaxis: Aspirin/acyclovir. STABLE  # IV access:PIV   [name]- [phone]  zometa-q 4w; MM; K/L q 4 W  # DISPOSITION:  # proceed with  dara SQ today # follow up in 2 weeks- MD; labs- cbc/cmp;Dara SQ;Zometa -  MM panel; k/l light chain;-;Dr.B

## 2021-08-22 NOTE — Progress Notes (Signed)
[facility] NOTE  Patient Care Team: Juline Patch, MD as PCP - General (Family Medicine) Vladimir Faster, [facility] (Inactive) (Pharmacist) Cammie Sickle, MD as Consulting Physician (Oncology)  CHIEF COMPLAINTS/PURPOSE OF CONSULTATION: Multiple myeloma  Oncology History Overview Note  # MULTIPLE MYELOMA  [FEB 2023-hypercalcemia status changes]-Active [bone lesions; hypercalcemia; anemia; No renal insuffiencey;Bone marrow-32% plasma cell-   STANDARD Cytogenetics].FEB 2023- S/p dexamethasone 20 mg q day x4.   # REV [15 mg -3 week-On and 1 week-OFF]-DARA; May 2023-discontinued rev 15 diarrhea/hypotension  # MAY 2023- dara-Rev 10 mg 3w/1w   # FEB 2023-hypercalcemia [[facility]-status post calcitonin; bisphosphonate]  BONE MARROW, ASPIRATE, CLOT, CORE:  -Hypercellular bone marrow with plasma cell neoplasm  -See comment   PERIPHERAL BLOOD:  -Normocytic-normochromic anemia   COMMENT:   The bone marrow is hypercellular for age with increased number of  atypical plasma cells representing 32% of all cells in the aspirate  associated with interstitial infiltrates and numerous variably sized  clusters in the clot/biopsy sections.  The plasma cells display weak  kappa light chain restriction consistent with plasma cell neoplasm.  Correlation with cytogenetic and FISH studies is recommended   MICROSCOPIC DESCRIPTION:   PERIPHERAL BLOOD SMEAR: The red blood cells display mild  anisopoikilocytosis with mild polychromasia.  The white blood cells are  normal number with scattered hypogranular neutrophils. An occasional  myelocyte and large atypical mononuclear cells are seen on scan. The  platelets are normal in number.    Multiple myeloma not having achieved remission ([place])  05/15/2021 Initial Diagnosis   Multiple myeloma not having achieved remission ([place])   06/27/2021 -  Chemotherapy   Patient is on Treatment Plan : MYELOMA Daratumumab SQ + Lenalidomide +  Dexamethasone (DaraRd) q28d     07/11/2021 Cancer Staging   Staging form: Plasma Cell Myeloma and Plasma Cell Disorders, AJCC 8th Edition - Clinical: Beta-2-microglobulin (mg/L): 2.6, Albumin (g/dL): 3.9, ISS: Stage I - Signed by Cammie Sickle, MD on 07/11/2021 Stage prefix: Initial diagnosis Beta 2 microglobulin range (mg/L): Less than 3.5 Albumin range (g/dL): Greater than or equal to 3.5     HISTORY OF PRESENTING ILLNESS: pt in a wheel chair; accompanied by her daughter.   Cristina Morgan 87 y.o.  female with  multiple myeloma currently on Rev-Dex is proceed with Dara SQ today.  Patient denies new problems/concerns today.  Patient currently on Revlimid 10 mg 3 weeks on 1 week off.  Notes to have improvement of the diarrhea.  Her appetite is better.  Blood sugars are better controlled.  She is gaining weight.  Patient states her back pain is improved.  Denies any worsening joint pains.  No falls.  No further episodes of mental status changes.    Review of Systems  Constitutional:  Positive for malaise/fatigue and weight loss. Negative for chills, diaphoresis and fever.  HENT:  Negative for nosebleeds and sore throat.   Eyes:  Negative for double vision.  Respiratory:  Negative for cough, hemoptysis, sputum production, shortness of breath and wheezing.   Cardiovascular:  Negative for chest pain, palpitations, orthopnea and leg swelling.  Gastrointestinal:  Negative for abdominal pain, blood in stool, constipation, diarrhea, heartburn, melena, nausea and vomiting.  Genitourinary:  Negative for dysuria, frequency and urgency.  Musculoskeletal:  Positive for back pain and joint pain.  Skin: Negative.  Negative for itching and rash.  Neurological:  Negative for dizziness, tingling, focal weakness, weakness and headaches.  Endo/Heme/Allergies:  Does not bruise/bleed easily.  Psychiatric/Behavioral:  Negative for depression. The patient is not nervous/anxious and does not have  insomnia.      MEDICAL HISTORY:  Past Medical History:  Diagnosis Date   Anemia    CAD (coronary artery disease)    Cataracts, bilateral    Closed compression fracture of first lumbar vertebra (HCC)    Closed compression fracture of second lumbar vertebra (HCC)    Diabetes mellitus without complication (HCC)    High cholesterol    History of pneumonia 2010   [place]   History of uterine fibroid    Hypercalcemia    Hyperlipidemia    Hypertension    Lytic bone lesions on xray    Multiple myeloma (HCC)    Osteoporosis    Primary osteoarthritis of right knee     SURGICAL HISTORY: Past Surgical History:  Procedure Laterality Date   CATARACT EXTRACTION Bilateral 2014   COLONOSCOPY  2011   cleared for 5 yrs- [facility]   ECTOPIC PREGNANCY SURGERY      SOCIAL HISTORY: Social History   Socioeconomic History   Marital status: Married    Spouse name: Not on file   Number of children: 2   Years of education: Not on file   Highest education level: 12th grade  Occupational History   Occupation: Retired  Tobacco Use   Smoking status: Never    Passive exposure: Never   Smokeless tobacco: Never   Tobacco comments:    smoking cessation materials not required  Vaping Use   Vaping Use: Never used  Substance and Sexual Activity   Alcohol use: No   Drug use: No   Sexual activity: Not Currently  Other Topics Concern   Not on file  Social History Narrative   Brave with husband; drives. Never smoked; no alcohol. Daughters live close.    Social Determinants of Health   Financial Resource Strain: Low Risk  (06/12/2021)   Overall Financial Resource Strain (CARDIA)    Difficulty of Paying Living Expenses: Not very hard  Food Insecurity: No Food Insecurity (06/12/2021)   Hunger Vital Sign    Worried About Running Out of Food in the Last Year: Never true    Ran Out of Food in the Last Year: Never true  Transportation Needs: No Transportation Needs (06/12/2021)   PRAPARE  - Hydrologist (Medical): No    Lack of Transportation (Non-Medical): No  Physical Activity: Inactive (06/12/2021)   Exercise Vital Sign    Days of Exercise per Week: 0 days    Minutes of Exercise per Session: 0 min  Stress: No Stress Concern Present (06/12/2021)   [place]    Feeling of Stress : Only a little  Social Connections: Moderately Integrated (06/12/2021)   Social Connection and Isolation Panel [NHANES]    Frequency of Communication with Friends and Family: Three times a week    Frequency of Social Gatherings with Friends and Family: Three times a week    Attends Religious Services: 1 to 4 times per year    Active Member of Clubs or Organizations: No    Attends Archivist Meetings: Never    Marital Status: Married  Human resources officer Violence: Not At Risk (06/12/2021)   Humiliation, Afraid, Rape, and Kick questionnaire    Fear of Current or Ex-Partner: No    Emotionally Abused: No    Physically Abused: No    Sexually Abused: No    FAMILY  HISTORY: Family History  Problem Relation Age of Onset   Cancer Mother        breast   Heart disease Mother    Diabetes Father    Heart disease Father    Heart attack Father    Heart attack Brother    Diabetes Brother     ALLERGIES:  is allergic to latex and penicillins.  MEDICATIONS:  Current Outpatient Medications  Medication Sig Dispense Refill   ACCU-CHEK GUIDE test strip USE 1 STRIP TO CHECK GLUCOSE ONCE DAILY AS DIRECTED     Accu-Chek Softclix Lancets lancets USE 1 TO CHECK GLUCOSE ONCE DAILY     acetaminophen (TYLENOL) 325 MG tablet Take 325 mg by mouth 2 (two) times daily. START 2 days prior to chemo infusion. Take For 2 days; Do NOT take on the day of infusion     acyclovir (ZOVIRAX) 400 MG tablet Take 1 tablet (400 mg total) by mouth 2 (two) times daily. 60 tablet 4   BD PEN NEEDLE NANO 2ND GEN 32G X 4 MM MISC USE 1  ONCE DAILY     Blood Glucose Monitoring Suppl (ACCU-CHEK GUIDE) w/Device KIT USE AS DIRECTED TO CHECK GLUCOSE     carvedilol (COREG) 3.125 MG tablet Take 1 tablet (3.125 mg total) by mouth 2 (two) times daily with a meal. 60 tablet 0   dexamethasone (DECADRON) 4 MG tablet Take 1 tablet (4 mg total) by mouth 2 (two) times daily. Start 2 days prior to infusion; Take it for 2 days. 60 tablet 3   diphenoxylate-atropine (LOMOTIL) 2.5-0.025 MG tablet Take 1 tablet by mouth 4 (four) times daily as needed for diarrhea or loose stools. Take it along with immodium 60 tablet 0   Glucerna (GLUCERNA) LIQD Take 237 mLs by mouth.     hydrOXYzine (ATARAX) 10 MG tablet Take 1 tablet (10 mg total) by mouth at bedtime as needed. 90 tablet 1   insulin glargine (LANTUS SOLOSTAR) 100 UNIT/ML Solostar Pen Inject 14 Units into the skin daily. 12 units every day except Wed and Thursday- 14 units 3 mL 0   lenalidomide (REVLIMID) 10 MG capsule Take 1 capsule (10 mg total) by mouth daily. Take for 21 days, then hold for 7 days. Repeat every 28 days. 21 capsule 3   lovastatin (MEVACOR) 40 MG tablet Take 1 tablet (40 mg total) by mouth every evening. 90 tablet 1   metFORMIN (GLUCOPHAGE) 1000 MG tablet TAKE ONE TABLET BY MOUTH TWICE DAILY 180 tablet 0   montelukast (SINGULAIR) 10 MG tablet Take 1 tablet (10 mg total) by mouth daily. START 2 days prior to chemo infusion. Take For 2 days; Do NOT take on the day of infusion. 20 tablet 1   ondansetron (ZOFRAN) 4 MG tablet Take 1 tablet (4 mg total) by mouth every 6 (six) hours as needed for nausea. 30 tablet 1   pantoprazole (PROTONIX) 40 MG tablet Take 1 tablet (40 mg total) by mouth daily. 90 tablet 1   sertraline (ZOLOFT) 25 MG tablet Take 1 tablet (25 mg total) by mouth daily. 90 tablet 1   baclofen (LIORESAL) 20 MG tablet Take 1 tablet (20 mg total) by mouth 2 (two) times daily. (Patient not taking: Reported on 08/01/2021) 30 tablet 0   Calcium Carbonate-Vit D-Min (CALCIUM 1200  PO) Take 1 capsule by mouth daily. (Patient not taking: Reported on 08/01/2021)     cholecalciferol (VITAMIN D3) 25 MCG (1000 UNIT) tablet Take 1,000 Units by mouth daily. (Patient not  taking: Reported on 08/01/2021)     losartan (COZAAR) 25 MG tablet TAKE ONE TABLET BY MOUTH EVERY EVENING (Patient not taking: Reported on 08/01/2021) 90 tablet 0   Multiple Vitamin (MULTIVITAMIN ADULT PO) Take by mouth. (Patient not taking: Reported on 07/11/2021)     Omega-3 Fatty Acids (FISH OIL) 1000 MG CAPS Take 1 capsule (1,000 mg total) by mouth daily. (Patient not taking: Reported on 07/11/2021) 100 capsule 3   potassium chloride SA (KLOR-CON M) 20 MEQ tablet Take 1 tablet (20 mEq total) by mouth 2 (two) times daily for 14 days. (Patient not taking: Reported on 07/11/2021) 28 tablet 0   No current facility-administered medications for this visit.      Marland Kitchen  PHYSICAL EXAMINATION:  Vitals:   08/22/21 0900  BP: (!) 124/57  Pulse: 79  Resp: 16  Temp: 97.7 F (36.5 C)   Filed Weights   08/22/21 0900  Weight: 124 lb 11.2 oz (56.6 kg)    Physical Exam Vitals and nursing note reviewed.  HENT:     Head: Normocephalic and atraumatic.     Mouth/Throat:     Pharynx: Oropharynx is clear.  Eyes:     Extraocular Movements: Extraocular movements intact.     Pupils: Pupils are equal, round, and reactive to light.  Cardiovascular:     Rate and Rhythm: Normal rate and regular rhythm.  Pulmonary:     Comments: Decreased breath sounds bilaterally.  Abdominal:     Palpations: Abdomen is soft.  Musculoskeletal:        General: Normal range of motion.     Cervical back: Normal range of motion.  Skin:    General: Skin is warm.  Neurological:     General: No focal deficit present.     Mental Status: She is alert and oriented to person, place, and time.  Psychiatric:        Behavior: Behavior normal.        Judgment: Judgment normal.      LABORATORY DATA:  I have reviewed the data as listed Lab Results   Component Value Date   WBC 6.7 08/22/2021   HGB 9.7 (L) 08/22/2021   HCT 29.9 (L) 08/22/2021   MCV 99.0 08/22/2021   PLT 216 08/22/2021   Recent Labs    02/01/21 1502 02/18/21 1615 04/15/21 1549 05/01/21 0320 08/01/21 0836 08/08/21 0931 08/22/21 0854  NA 138  --   --    < > 133* 136 135  K 4.3  --   --    < > 4.2 3.8 3.9  CL 101  --   --    < > 103 105 100  CO2 23  --   --    < > _0 GLUCOSE 178*  --   --    < > 132* 201* 205*  BUN 19  --   --    < > _1 CREATININE 0.84  --   --    < > 1.11* 1.05* 0.84  CALCIUM 10.7*   < >  --    < > 9.0 8.8* 9.7  GFRNONAA  --   --   --    < > 49* 52* >60  PROT  --   --   --    < > 6.7 6.9 6.4*  ALBUMIN 4.3  --  4.4   < > 3.7 3.8 3.8  AST 16  --  17   < > 21  20 23  ALT 9  --  8   < > _0 ALKPHOS 102  --  108   < > 124 128* 98  BILITOT 0.3  --  0.3   < > 0.6 0.3 0.6  BILIDIR 0.12  --  0.10  --   --   --   --    < > = values in this interval not displayed.    RADIOGRAPHIC STUDIES: I have personally reviewed the radiological images as listed and agreed with the findings in the report. No results found.  Multiple myeloma not having achieved remission ([place]) # STAGE I- MULTIPLE MYELOMA -Active [bone lesions; hypercalcemia; anemia; No renal insuffiencey;Bone marrow-32% plasma cell-   STANDARD Cytogenetics].  Currently on Revlimid-Dex- Dara SQ. Partial response noted; MAY 25th 2023-M protein = 0.5 gm/fl; K/L= 1. 9.   #Proceed with Dara SQ today; Labs today reviewed;  acceptable for treatment today. On Revlimid at 10 mg dose 3 weeks on 1 week off. Keep dex 8 mg weekly.    # Diarrhea- improved on Revlimid 10 mg a day.  # Diabetes-on oral hypoglycemic agents; BG-201  on long acting insulin; 14 units of lantus on T/W/Thursday.  Stable.  # HTN: continue to Hold OFF losartan; continue coreg; check BP at home; bring log to next visit.   #Hypercalcemia secondary to multiple myeloma- [FEB 2023-Zometa; calcitonin]-calcium today is  9.26   #DVT/shingles prophylaxis: Aspirin/acyclovir. STABLE  # IV access:PIV   [name]- [phone]  zometa-q 4w; MM; K/L q 4 W  # DISPOSITION:  # proceed with  dara SQ today # follow up in 2 weeks- MD; labs- cbc/cmp;Dara SQ;Zometa -  MM panel; k/l light chain;-;Dr.B      All questions were answered. The patient knows to call the clinic with any problems, questions or concerns.       Cammie Sickle, MD 08/22/2021 9:44 AM

## 2021-08-24 ENCOUNTER — Other Ambulatory Visit: Payer: Self-pay | Admitting: Internal Medicine

## 2021-08-24 DIAGNOSIS — C9000 Multiple myeloma not having achieved remission: Secondary | ICD-10-CM

## 2021-08-26 ENCOUNTER — Encounter: Payer: Self-pay | Admitting: Internal Medicine

## 2021-08-26 ENCOUNTER — Other Ambulatory Visit: Payer: Self-pay | Admitting: Family Medicine

## 2021-08-26 DIAGNOSIS — I10 Essential (primary) hypertension: Secondary | ICD-10-CM

## 2021-08-26 DIAGNOSIS — E119 Type 2 diabetes mellitus without complications: Secondary | ICD-10-CM

## 2021-08-27 DIAGNOSIS — E871 Hypo-osmolality and hyponatremia: Secondary | ICD-10-CM | POA: Diagnosis not present

## 2021-08-27 DIAGNOSIS — M8458XD Pathological fracture in neoplastic disease, other specified site, subsequent encounter for fracture with routine healing: Secondary | ICD-10-CM | POA: Diagnosis not present

## 2021-08-27 DIAGNOSIS — I2510 Atherosclerotic heart disease of native coronary artery without angina pectoris: Secondary | ICD-10-CM | POA: Diagnosis not present

## 2021-08-27 DIAGNOSIS — I10 Essential (primary) hypertension: Secondary | ICD-10-CM | POA: Diagnosis not present

## 2021-08-27 DIAGNOSIS — C9000 Multiple myeloma not having achieved remission: Secondary | ICD-10-CM | POA: Diagnosis not present

## 2021-08-28 ENCOUNTER — Encounter: Payer: Self-pay | Admitting: Family Medicine

## 2021-08-30 ENCOUNTER — Ambulatory Visit (INDEPENDENT_AMBULATORY_CARE_PROVIDER_SITE_OTHER): Payer: Medicare Other | Admitting: Family Medicine

## 2021-08-30 ENCOUNTER — Encounter: Payer: Self-pay | Admitting: Family Medicine

## 2021-08-30 VITALS — BP 124/50 | HR 74 | Ht 66.0 in | Wt 124.7 lb

## 2021-08-30 DIAGNOSIS — Z794 Long term (current) use of insulin: Secondary | ICD-10-CM | POA: Diagnosis not present

## 2021-08-30 DIAGNOSIS — E1169 Type 2 diabetes mellitus with other specified complication: Secondary | ICD-10-CM | POA: Diagnosis not present

## 2021-08-30 DIAGNOSIS — I10 Essential (primary) hypertension: Secondary | ICD-10-CM

## 2021-08-30 DIAGNOSIS — K219 Gastro-esophageal reflux disease without esophagitis: Secondary | ICD-10-CM | POA: Diagnosis not present

## 2021-08-30 MED ORDER — CARVEDILOL 3.125 MG PO TABS: 3.1250 mg | ORAL_TABLET | Freq: Two times a day (BID) | ORAL | 0 refills | Status: DC

## 2021-08-30 MED ORDER — PANTOPRAZOLE SODIUM 40 MG PO TBEC: 40.0000 mg | DELAYED_RELEASE_TABLET | Freq: Every day | ORAL | 1 refills | Status: DC

## 2021-08-30 NOTE — Progress Notes (Signed)
Date:  08/30/2021   Name:  Cristina Morgan   DOB:  08-Oct-1934   MRN:  [id number]   Chief Complaint: Diabetes, Gastroesophageal Reflux, and Hypertension  Diabetes She presents for her follow-up diabetic visit. Diabetes type: steroid induced/exacerbated. Her disease course has been stable. There are no hypoglycemic associated symptoms. Pertinent negatives for hypoglycemia include no dizziness, headaches or nervousness/anxiousness. There are no diabetic associated symptoms. Pertinent negatives for diabetes include no chest pain and no polydipsia. There are no hypoglycemic complications. Symptoms are stable. There are no diabetic complications. Risk factors for coronary artery disease include dyslipidemia and hypertension. She is following a generally healthy diet.  Gastroesophageal Reflux She reports no abdominal pain, no belching, no chest pain, no choking, no coughing, no dysphagia, no early satiety, no globus sensation, no heartburn, no hoarse voice, no nausea, no sore throat, no stridor, no tooth decay, no water brash or no wheezing. This is a chronic problem. The problem has been gradually improving.  Hypertension This is a chronic problem. The current episode started more than 1 year ago. The problem has been gradually improving since onset. The problem is controlled. Pertinent negatives include no chest pain, headaches, neck pain, palpitations or shortness of breath.    Lab Results  Component Value Date   NA 135 08/22/2021   K 3.9 08/22/2021   CO2 26 08/22/2021   GLUCOSE 205 (H) 08/22/2021   BUN 20 08/22/2021   CREATININE 0.84 08/22/2021   CALCIUM 9.7 08/22/2021   EGFR 68 02/01/2021   GFRNONAA >60 08/22/2021   Lab Results  Component Value Date   CHOL 176 07/24/2020   HDL 74 07/24/2020   LDLCALC 90 07/24/2020   TRIG 63 07/24/2020   CHOLHDL 2.7 03/01/2018   Lab Results  Component Value Date   TSH 0.367 05/03/2021   Lab Results  Component Value Date   HGBA1C 8.7 (H)  07/19/2021   Lab Results  Component Value Date   WBC 6.7 08/22/2021   HGB 9.7 (L) 08/22/2021   HCT 29.9 (L) 08/22/2021   MCV 99.0 08/22/2021   PLT 216 08/22/2021   Lab Results  Component Value Date   ALT 13 08/22/2021   AST 23 08/22/2021   ALKPHOS 98 08/22/2021   BILITOT 0.6 08/22/2021   No results found for: "25OHVITD2", "25OHVITD3", "VD25OH"   Review of Systems  Constitutional:  Negative for chills and fever.  HENT:  Negative for drooling, ear discharge, ear pain, hoarse voice and sore throat.   Respiratory:  Negative for cough, choking, shortness of breath and wheezing.   Cardiovascular:  Negative for chest pain, palpitations and leg swelling.  Gastrointestinal:  Negative for abdominal pain, blood in stool, constipation, diarrhea, dysphagia, heartburn and nausea.  Endocrine: Negative for polydipsia.  Genitourinary:  Negative for dysuria, frequency, hematuria and urgency.  Musculoskeletal:  Negative for back pain, myalgias and neck pain.  Skin:  Negative for rash.  Allergic/Immunologic: Negative for environmental allergies.  Neurological:  Negative for dizziness and headaches.  Hematological:  Does not bruise/bleed easily.  Psychiatric/Behavioral:  Negative for suicidal ideas. The patient is not nervous/anxious.     Patient Active Problem List   Diagnosis Date Noted   Multiple myeloma not having achieved remission ([place]) 05/15/2021   Pressure injury of coccygeal region, stage 2 ([place]) 05/07/2021   Hyponatremia 05/04/2021   Pressure injury of skin 05/02/2021   Vitamin B12 deficiency 05/02/2021   Hypomagnesemia 05/02/2021   Hypophosphatemia 05/02/2021   SVT (supraventricular tachycardia) ([place]) 05/02/2021  Altered mental status 73/41/9379   Acute metabolic encephalopathy 02/40/9735   Hypercalcemia 05/01/2021   Closed compression fracture of second lumbar vertebra ([place]) 10/28/2020   Need for vaccination against Streptococcus pneumoniae using pneumococcal conjugate  vaccine 13 01/07/2017   Primary osteoarthritis of right knee 12/08/2016   PNA (pneumonia) 07/09/2015   Acute chest pain 03/20/2014   Type 2 diabetes mellitus ([place]) 03/20/2014   Hypercholesterolemia 03/20/2014   Essential hypertension 03/20/2014    Allergies  Allergen Reactions   Latex Rash   Penicillins Other (See Comments)    Past Surgical History:  Procedure Laterality Date   CATARACT EXTRACTION Bilateral 2014   COLONOSCOPY  2011   cleared for 5 yrs- [facility]   ECTOPIC PREGNANCY SURGERY      Social History   Tobacco Use   Smoking status: Never    Passive exposure: Never   Smokeless tobacco: Never   Tobacco comments:    smoking cessation materials not required  Vaping Use   Vaping Use: Never used  Substance Use Topics   Alcohol use: No   Drug use: No     Medication list has been reviewed and updated.  Current Meds  Medication Sig   ACCU-CHEK GUIDE test strip USE 1 STRIP TO CHECK GLUCOSE ONCE DAILY AS DIRECTED   Accu-Chek Softclix Lancets lancets USE 1 TO CHECK GLUCOSE ONCE DAILY   acetaminophen (TYLENOL) 325 MG tablet Take 325 mg by mouth 2 (two) times daily. START 2 days prior to chemo infusion. Take For 2 days; Do NOT take on the day of infusion   acyclovir (ZOVIRAX) 400 MG tablet Take 1 tablet (400 mg total) by mouth 2 (two) times daily.   BD PEN NEEDLE NANO 2ND GEN 32G X 4 MM MISC USE 1 ONCE DAILY   Blood Glucose Monitoring Suppl (ACCU-CHEK GUIDE) w/Device KIT USE AS DIRECTED TO CHECK GLUCOSE   Calcium Carbonate-Vit D-Min (CALCIUM 1200 PO) Take 1 capsule by mouth daily.   carvedilol (COREG) 3.125 MG tablet Take 1 tablet (3.125 mg total) by mouth 2 (two) times daily with a meal.   cholecalciferol (VITAMIN D3) 25 MCG (1000 UNIT) tablet Take 1,000 Units by mouth daily.   dexamethasone (DECADRON) 4 MG tablet Take 1 tablet (4 mg total) by mouth 2 (two) times daily. Start 2 days prior to infusion; Take it for 2 days.   diphenoxylate-atropine (LOMOTIL) 2.5-0.025 MG  tablet Take 1 tablet by mouth 4 (four) times daily as needed for diarrhea or loose stools. Take it along with immodium   Glucerna (GLUCERNA) LIQD Take 237 mLs by mouth.   hydrOXYzine (ATARAX) 10 MG tablet Take 1 tablet (10 mg total) by mouth at bedtime as needed.   insulin glargine (LANTUS SOLOSTAR) 100 UNIT/ML Solostar Pen Inject 14 Units into the skin daily. 12 units every day except Wed and Thursday- 14 units   lenalidomide (REVLIMID) 10 MG capsule Take 1 capsule (10 mg total) by mouth daily. Take for 21 days, then hold for 7 days. Repeat every 28 days.   losartan (COZAAR) 25 MG tablet TAKE ONE TABLET BY MOUTH EVERY EVENING   lovastatin (MEVACOR) 40 MG tablet Take 1 tablet (40 mg total) by mouth every evening.   metFORMIN (GLUCOPHAGE) 1000 MG tablet TAKE ONE TABLET BY MOUTH TWICE DAILY   montelukast (SINGULAIR) 10 MG tablet Take ONE tablet by MOUTH daily. START TWO DAYS prior TO chemo infusion. Take FOR TWO DAYS; DO NOT take ON THE DAY of infusion.   ondansetron (ZOFRAN) 4 MG  tablet Take 1 tablet (4 mg total) by mouth every 6 (six) hours as needed for nausea.   pantoprazole (PROTONIX) 40 MG tablet Take 1 tablet (40 mg total) by mouth daily.   sertraline (ZOLOFT) 25 MG tablet Take 1 tablet (25 mg total) by mouth daily.       08/30/2021    2:10 PM 07/01/2021    3:04 PM 05/17/2021    1:46 PM 02/01/2021    2:15 PM  GAD 7 : Generalized Anxiety Score  Nervous, Anxious, on Edge 0 0 0 0  Control/stop worrying 0 0 1 0  Worry too much - different things 0 0 1 0  Trouble relaxing 1 0 0 1  Restless 0 0 0   Easily annoyed or irritable 0 0 0 0  Afraid - awful might happen 0 0 0 0  Total GAD 7 Score 1 0 2   Anxiety Difficulty Not difficult at all Not difficult at all Not difficult at all        08/30/2021    2:10 PM  Depression screen PHQ 2/9  Decreased Interest 0  Down, Depressed, Hopeless 1  PHQ - 2 Score 1  Altered sleeping 1  Tired, decreased energy 0  Change in appetite 0  Feeling bad  or failure about yourself  0  Trouble concentrating 0  Moving slowly or fidgety/restless 0  Suicidal thoughts 0  PHQ-9 Score 2  Difficult doing work/chores Not difficult at all    BP Readings from Last 3 Encounters:  08/30/21 (!) 124/50  08/22/21 (!) 124/57  08/08/21 (!) 154/68    Physical Exam Vitals and nursing note reviewed. Exam conducted with a chaperone present.  Constitutional:      General: She is not in acute distress.    Appearance: She is not diaphoretic.  HENT:     Head: Normocephalic and atraumatic.     Right Ear: External ear normal.     Left Ear: External ear normal.     Nose: Nose normal.  Eyes:     General:        Right eye: No discharge.        Left eye: No discharge.     Conjunctiva/sclera: Conjunctivae normal.     Pupils: Pupils are equal, round, and reactive to light.  Neck:     Thyroid: No thyromegaly.     Vascular: No JVD.  Cardiovascular:     Rate and Rhythm: Normal rate and regular rhythm.     Heart sounds: Normal heart sounds. No murmur heard.    No friction rub. No gallop.  Pulmonary:     Effort: Pulmonary effort is normal.     Breath sounds: Normal breath sounds.  Abdominal:     General: Bowel sounds are normal.     Palpations: Abdomen is soft. There is no mass.     Tenderness: There is no abdominal tenderness. There is no guarding.  Musculoskeletal:        General: Normal range of motion.     Cervical back: Normal range of motion and neck supple.  Lymphadenopathy:     Cervical: No cervical adenopathy.  Skin:    General: Skin is warm and dry.  Neurological:     Mental Status: She is alert.     Deep Tendon Reflexes: Reflexes are normal and symmetric.     Wt Readings from Last 3 Encounters:  08/30/21 124 lb 11.2 oz (56.6 kg)  08/22/21 124 lb 11.2 oz (56.6 kg)  08/08/21 123 lb 9.6 oz (56.1 kg)    BP (!) 124/50   Pulse 74   Ht _0  (1.676 m)   Wt 124 lb 11.2 oz (56.6 kg)   SpO2 98%   BMI 20.13 kg/m   Assessment and  Plan:  1. Type 2 diabetes mellitus with other specified complication, with long-term current use of insulin (HCC) Chronic.  Controlled.  Stable.  Patient has readings that she sent to me which are acceptable range.  We will check an A1c which is the first A1c that have had a true reading while on long-acting insulin.  If remains elevated this is probably due to elevation of postprandial blood sugars but these are resolving within hours.  If the A1c has improved below 8 I am comfortable maintaining in this area for right now.  We will recheck patient's diabetic circumstance in 82-month - HgB A1c  2. Gastroesophageal reflux disease without esophagitis Chronic.  Controlled.  Stable.  Refill pantoprazole 40 mg once a day. - pantoprazole (PROTONIX) 40 MG tablet; Take 1 tablet (40 mg total) by mouth daily.  Dispense: 90 tablet; Refill: 1  3. Essential (primary) hypertension Chronic.  Controlled.  Stable.  Blood pressure today is 124/50.  Continue carvedilol 3.125 twice a day.  We will recheck in 4 months - carvedilol (COREG) 3.125 MG tablet; Take 1 tablet (3.125 mg total) by mouth 2 (two) times daily with a meal.  Dispense: 60 tablet; Refill: 0

## 2021-08-31 LAB — HEMOGLOBIN A1C
Est. average glucose Bld gHb Est-mCnc: 183 mg/dL
Hgb A1c MFr Bld: 8 % — ABNORMAL HIGH (ref 4.8–5.6)

## 2021-09-04 DIAGNOSIS — M8458XD Pathological fracture in neoplastic disease, other specified site, subsequent encounter for fracture with routine healing: Secondary | ICD-10-CM | POA: Diagnosis not present

## 2021-09-04 DIAGNOSIS — I10 Essential (primary) hypertension: Secondary | ICD-10-CM | POA: Diagnosis not present

## 2021-09-04 DIAGNOSIS — I2510 Atherosclerotic heart disease of native coronary artery without angina pectoris: Secondary | ICD-10-CM | POA: Diagnosis not present

## 2021-09-04 DIAGNOSIS — C9000 Multiple myeloma not having achieved remission: Secondary | ICD-10-CM | POA: Diagnosis not present

## 2021-09-04 DIAGNOSIS — E871 Hypo-osmolality and hyponatremia: Secondary | ICD-10-CM | POA: Diagnosis not present

## 2021-09-05 ENCOUNTER — Inpatient Hospital Stay (HOSPITAL_BASED_OUTPATIENT_CLINIC_OR_DEPARTMENT_OTHER): Payer: Medicare Other | Admitting: Internal Medicine

## 2021-09-05 ENCOUNTER — Encounter: Payer: Self-pay | Admitting: Internal Medicine

## 2021-09-05 ENCOUNTER — Inpatient Hospital Stay: Payer: Medicare Other

## 2021-09-05 VITALS — BP 145/67 | HR 77 | Temp 97.5°F | Ht 66.0 in | Wt 129.9 lb

## 2021-09-05 DIAGNOSIS — E119 Type 2 diabetes mellitus without complications: Secondary | ICD-10-CM | POA: Diagnosis not present

## 2021-09-05 DIAGNOSIS — C9000 Multiple myeloma not having achieved remission: Secondary | ICD-10-CM

## 2021-09-05 DIAGNOSIS — D649 Anemia, unspecified: Secondary | ICD-10-CM | POA: Diagnosis not present

## 2021-09-05 DIAGNOSIS — I10 Essential (primary) hypertension: Secondary | ICD-10-CM | POA: Diagnosis not present

## 2021-09-05 DIAGNOSIS — Z5112 Encounter for antineoplastic immunotherapy: Secondary | ICD-10-CM | POA: Diagnosis not present

## 2021-09-05 LAB — CBC WITH DIFFERENTIAL/PLATELET
Abs Immature Granulocytes: 0.01 10*3/uL (ref 0.00–0.07)
Basophils Absolute: 0 10*3/uL (ref 0.0–0.1)
Basophils Relative: 1 %
Eosinophils Absolute: 0.1 10*3/uL (ref 0.0–0.5)
Eosinophils Relative: 2 %
HCT: 31.3 % — ABNORMAL LOW (ref 36.0–46.0)
Hemoglobin: 10 g/dL — ABNORMAL LOW (ref 12.0–15.0)
Immature Granulocytes: 0 %
Lymphocytes Relative: 37 %
Lymphs Abs: 1.8 10*3/uL (ref 0.7–4.0)
MCH: 31.9 pg (ref 26.0–34.0)
MCHC: 31.9 g/dL (ref 30.0–36.0)
MCV: 100 fL (ref 80.0–100.0)
Monocytes Absolute: 0.8 10*3/uL (ref 0.1–1.0)
Monocytes Relative: 17 %
Neutro Abs: 2 10*3/uL (ref 1.7–7.7)
Neutrophils Relative %: 43 %
Platelets: 303 10*3/uL (ref 150–400)
RBC: 3.13 MIL/uL — ABNORMAL LOW (ref 3.87–5.11)
RDW: 16.1 % — ABNORMAL HIGH (ref 11.5–15.5)
WBC: 4.8 10*3/uL (ref 4.0–10.5)
nRBC: 0 % (ref 0.0–0.2)

## 2021-09-05 LAB — COMPREHENSIVE METABOLIC PANEL
ALT: 18 U/L (ref 0–44)
AST: 22 U/L (ref 15–41)
Albumin: 4.1 g/dL (ref 3.5–5.0)
Alkaline Phosphatase: 90 U/L (ref 38–126)
Anion gap: 10 (ref 5–15)
BUN: 18 mg/dL (ref 8–23)
CO2: 24 mmol/L (ref 22–32)
Calcium: 9.1 mg/dL (ref 8.9–10.3)
Chloride: 103 mmol/L (ref 98–111)
Creatinine, Ser: 0.98 mg/dL (ref 0.44–1.00)
GFR, Estimated: 57 mL/min — ABNORMAL LOW (ref >60)
Glucose, Bld: 106 mg/dL — ABNORMAL HIGH (ref 70–99)
Potassium: 3.5 mmol/L (ref 3.5–5.1)
Sodium: 137 mmol/L (ref 135–145)
Total Bilirubin: 0.4 mg/dL (ref 0.3–1.2)
Total Protein: 7 g/dL (ref 6.5–8.1)

## 2021-09-05 MED ORDER — DARATUMUMAB-HYALURONIDASE-FIHJ 1800-30000 MG-UT/15ML ~~LOC~~ SOLN
1800.0000 mg | Freq: Once | SUBCUTANEOUS | Status: DC
  Filled 2021-09-05: qty 15

## 2021-09-05 MED ORDER — DEXAMETHASONE 4 MG PO TABS
20.0000 mg | ORAL_TABLET | Freq: Once | ORAL | Status: AC
  Administered 2021-09-05: 20 mg via ORAL
  Filled 2021-09-05: qty 5

## 2021-09-05 MED ORDER — ZOLEDRONIC ACID 4 MG/5ML IV CONC
3.0000 mg | Freq: Once | INTRAVENOUS | Status: DC
  Filled 2021-09-05: qty 3.75

## 2021-09-05 MED ORDER — SODIUM CHLORIDE 0.9 % IV SOLN
Freq: Once | INTRAVENOUS | Status: AC
  Filled 2021-09-05: qty 250

## 2021-09-05 MED ORDER — DIPHENHYDRAMINE HCL 50 MG/ML IJ SOLN: 50.0000 mg | Freq: Once | INTRAMUSCULAR | Status: DC

## 2021-09-05 MED ORDER — DEXAMETHASONE 4 MG PO TABS: 20.0000 mg | ORAL_TABLET | Freq: Once | ORAL | Status: DC

## 2021-09-05 MED ORDER — DARATUMUMAB-HYALURONIDASE-FIHJ 1800-30000 MG-UT/15ML ~~LOC~~ SOLN
1800.0000 mg | Freq: Once | SUBCUTANEOUS | Status: AC
  Administered 2021-09-05: 1800 mg via SUBCUTANEOUS
  Filled 2021-09-05: qty 15

## 2021-09-05 MED ORDER — ACETAMINOPHEN 325 MG PO TABS
650.0000 mg | ORAL_TABLET | Freq: Once | ORAL | Status: AC
  Administered 2021-09-05: 650 mg via ORAL
  Filled 2021-09-05: qty 2

## 2021-09-05 MED ORDER — ACETAMINOPHEN 325 MG PO TABS: 650.0000 mg | ORAL_TABLET | Freq: Once | ORAL | Status: DC

## 2021-09-05 MED ORDER — DIPHENHYDRAMINE HCL 50 MG/ML IJ SOLN
50.0000 mg | Freq: Once | INTRAMUSCULAR | Status: AC
  Administered 2021-09-05: 50 mg via INTRAVENOUS
  Filled 2021-09-05: qty 1

## 2021-09-05 MED ORDER — DIPHENHYDRAMINE HCL 25 MG PO CAPS: 50.0000 mg | ORAL_CAPSULE | Freq: Once | ORAL | Status: DC

## 2021-09-05 MED ORDER — ZOLEDRONIC ACID 4 MG/5ML IV CONC
3.0000 mg | Freq: Once | INTRAVENOUS | Status: AC
  Administered 2021-09-05: 3 mg via INTRAVENOUS
  Filled 2021-09-05: qty 3.75

## 2021-09-05 NOTE — Progress Notes (Unsigned)
[place] NOTE  Patient Care Team: Juline Patch, MD as PCP - General (Family Medicine) Vladimir Faster, [facility] (Inactive) (Pharmacist) Cammie Sickle, MD as Consulting Physician (Oncology)  CHIEF COMPLAINTS/PURPOSE OF CONSULTATION: Multiple myeloma  Oncology History Overview Note  # MULTIPLE MYELOMA  [FEB 2023-hypercalcemia status changes]-Active [bone lesions; hypercalcemia; anemia; No renal insuffiencey;Bone marrow-32% plasma cell-   STANDARD Cytogenetics].FEB 2023- S/p dexamethasone 20 mg q day x4.   # REV [15 mg -3 week-On and 1 week-OFF]-DARA; May 2023-discontinued rev 15 diarrhea/hypotension  # MAY 2023- dara-Rev 10 mg 3w/1w   # FEB 2023-hypercalcemia [[facility]-status post calcitonin; bisphosphonate]  BONE MARROW, ASPIRATE, CLOT, CORE:  -Hypercellular bone marrow with plasma cell neoplasm  -See comment   PERIPHERAL BLOOD:  -Normocytic-normochromic anemia   COMMENT:   The bone marrow is hypercellular for age with increased number of  atypical plasma cells representing 32% of all cells in the aspirate  associated with interstitial infiltrates and numerous variably sized  clusters in the clot/biopsy sections.  The plasma cells display weak  kappa light chain restriction consistent with plasma cell neoplasm.  Correlation with cytogenetic and FISH studies is recommended   MICROSCOPIC DESCRIPTION:   PERIPHERAL BLOOD SMEAR: The red blood cells display mild  anisopoikilocytosis with mild polychromasia.  The white blood cells are  normal number with scattered hypogranular neutrophils. An occasional  myelocyte and large atypical mononuclear cells are seen on scan. The  platelets are normal in number.    Multiple myeloma not having achieved remission ([place])  05/15/2021 Initial Diagnosis   Multiple myeloma not having achieved remission ([place])   06/27/2021 -  Chemotherapy   Patient is on Treatment Plan : MYELOMA Daratumumab SQ + Lenalidomide +  Dexamethasone (DaraRd) q28d     07/11/2021 Cancer Staging   Staging form: Plasma Cell Myeloma and Plasma Cell Disorders, AJCC 8th Edition - Clinical: Beta-2-microglobulin (mg/L): 2.6, Albumin (g/dL): 3.9, ISS: Stage I - Signed by Cammie Sickle, MD on 07/11/2021 Stage prefix: Initial diagnosis Beta 2 microglobulin range (mg/L): Less than 3.5 Albumin range (g/dL): Greater than or equal to 3.5     HISTORY OF PRESENTING ILLNESS: pt in a wheel chair; accompanied by her daughter.   Cristina Morgan 87 y.o.  female with  multiple myeloma currently on Rev-Dex is proceed with Dara SQ today.  Patient currently on Revlimid 10 mg 3 weeks on 1 week off.  Notes to have improvement of the diarrhea.  Does not have to take any Imodium.  Her appetite is better.  Blood sugars are better controlled.  She is gaining weight.  Patient states her back pain is improved.  Denies any worsening joint pains.  No falls.  No further episodes of mental status changes.    Review of Systems  Constitutional:  Positive for malaise/fatigue and weight loss. Negative for chills, diaphoresis and fever.  HENT:  Negative for nosebleeds and sore throat.   Eyes:  Negative for double vision.  Respiratory:  Negative for cough, hemoptysis, sputum production, shortness of breath and wheezing.   Cardiovascular:  Negative for chest pain, palpitations, orthopnea and leg swelling.  Gastrointestinal:  Negative for abdominal pain, blood in stool, constipation, diarrhea, heartburn, melena, nausea and vomiting.  Genitourinary:  Negative for dysuria, frequency and urgency.  Musculoskeletal:  Positive for back pain and joint pain.  Skin: Negative.  Negative for itching and rash.  Neurological:  Negative for dizziness, tingling, focal weakness, weakness and headaches.  Endo/Heme/Allergies:  Does not  bruise/bleed easily.  Psychiatric/Behavioral:  Negative for depression. The patient is not nervous/anxious and does not have insomnia.       MEDICAL HISTORY:  Past Medical History:  Diagnosis Date  . Anemia   . CAD (coronary artery disease)   . Cataracts, bilateral   . Closed compression fracture of first lumbar vertebra ([place])   . Closed compression fracture of second lumbar vertebra ([place])   . Diabetes mellitus without complication ([place])   . High cholesterol   . History of pneumonia 2010   [place]  . History of uterine fibroid   . Hypercalcemia   . Hyperlipidemia   . Hypertension   . Lytic bone lesions on xray   . Multiple myeloma ([place])   . Osteoporosis   . Primary osteoarthritis of right knee     SURGICAL HISTORY: Past Surgical History:  Procedure Laterality Date  . CATARACT EXTRACTION Bilateral 2014  . COLONOSCOPY  2011   cleared for 5 yrs- [facility]  . ECTOPIC PREGNANCY SURGERY      SOCIAL HISTORY: Social History   Socioeconomic History  . Marital status: Married    Spouse name: Not on file  . Number of children: 2  . Years of education: Not on file  . Highest education level: 12th grade  Occupational History  . Occupation: Retired  Tobacco Use  . Smoking status: Never    Passive exposure: Never  . Smokeless tobacco: Never  . Tobacco comments:    smoking cessation materials not required  Vaping Use  . Vaping Use: Never used  Substance and Sexual Activity  . Alcohol use: No  . Drug use: No  . Sexual activity: Not Currently  Other Topics Concern  . Not on file  Social History Narrative   [place] with husband; drives. Never smoked; no alcohol. Daughters live close.    Social Determinants of Health   Financial Resource Strain: Low Risk  (06/12/2021)   Overall Financial Resource Strain (CARDIA)   . Difficulty of Paying Living Expenses: Not very hard  Food Insecurity: No Food Insecurity (06/12/2021)   Hunger Vital Sign   . Worried About Charity fundraiser in the Last Year: Never true   . Ran Out of Food in the Last Year: Never true  Transportation Needs: No Transportation Needs  (06/12/2021)   PRAPARE - Transportation   . Lack of Transportation (Medical): No   . Lack of Transportation (Non-Medical): No  Physical Activity: Inactive (06/12/2021)   Exercise Vital Sign   . Days of Exercise per Week: 0 days   . Minutes of Exercise per Session: 0 min  Stress: No Stress Concern Present (06/12/2021)   [place]   . Feeling of Stress : Only a little  Social Connections: Moderately Integrated (06/12/2021)   Social Connection and Isolation Panel [NHANES]   . Frequency of Communication with Friends and Family: Three times a week   . Frequency of Social Gatherings with Friends and Family: Three times a week   . Attends Religious Services: 1 to 4 times per year   . Active Member of Clubs or Organizations: No   . Attends Archivist Meetings: Never   . Marital Status: Married  Human resources officer Violence: Not At Risk (06/12/2021)   Humiliation, Afraid, Rape, and Kick questionnaire   . Fear of Current or Ex-Partner: No   . Emotionally Abused: No   . Physically Abused: No   . Sexually Abused: No  FAMILY HISTORY: Family History  Problem Relation Age of Onset  . Cancer Mother        breast  . Heart disease Mother   . Diabetes Father   . Heart disease Father   . Heart attack Father   . Heart attack Brother   . Diabetes Brother     ALLERGIES:  is allergic to latex and penicillins.  MEDICATIONS:  Current Outpatient Medications  Medication Sig Dispense Refill  . ACCU-CHEK GUIDE test strip USE 1 STRIP TO CHECK GLUCOSE ONCE DAILY AS DIRECTED    . Accu-Chek Softclix Lancets lancets USE 1 TO CHECK GLUCOSE ONCE DAILY    . acetaminophen (TYLENOL) 325 MG tablet Take 325 mg by mouth 2 (two) times daily. START 2 days prior to chemo infusion. Take For 2 days; Do NOT take on the day of infusion    . acyclovir (ZOVIRAX) 400 MG tablet Take 1 tablet (400 mg total) by mouth 2 (two) times daily. 60 tablet 4  .  BD PEN NEEDLE NANO 2ND GEN 32G X 4 MM MISC USE 1 ONCE DAILY    . Blood Glucose Monitoring Suppl (ACCU-CHEK GUIDE) w/Device KIT USE AS DIRECTED TO CHECK GLUCOSE    . Calcium Carbonate-Vit D-Min (CALCIUM 1200 PO) Take 1 capsule by mouth daily.    . carvedilol (COREG) 3.125 MG tablet Take 1 tablet (3.125 mg total) by mouth 2 (two) times daily with a meal. 60 tablet 0  . cholecalciferol (VITAMIN D3) 25 MCG (1000 UNIT) tablet Take 1,000 Units by mouth daily.    Marland Kitchen dexamethasone (DECADRON) 4 MG tablet Take 1 tablet (4 mg total) by mouth 2 (two) times daily. Start 2 days prior to infusion; Take it for 2 days. 60 tablet 3  . diphenoxylate-atropine (LOMOTIL) 2.5-0.025 MG tablet Take 1 tablet by mouth 4 (four) times daily as needed for diarrhea or loose stools. Take it along with immodium 60 tablet 0  . Glucerna (GLUCERNA) LIQD Take 237 mLs by mouth.    . hydrOXYzine (ATARAX) 10 MG tablet Take 1 tablet (10 mg total) by mouth at bedtime as needed. 90 tablet 1  . insulin glargine (LANTUS SOLOSTAR) 100 UNIT/ML Solostar Pen Inject 14 Units into the skin daily. 12 units every day except Wed and Thursday- 14 units 3 mL 0  . lenalidomide (REVLIMID) 10 MG capsule Take 1 capsule (10 mg total) by mouth daily. Take for 21 days, then hold for 7 days. Repeat every 28 days. 21 capsule 3  . losartan (COZAAR) 25 MG tablet TAKE ONE TABLET BY MOUTH EVERY EVENING 90 tablet 0  . lovastatin (MEVACOR) 40 MG tablet Take 1 tablet (40 mg total) by mouth every evening. 90 tablet 1  . metFORMIN (GLUCOPHAGE) 1000 MG tablet TAKE ONE TABLET BY MOUTH TWICE DAILY 180 tablet 0  . montelukast (SINGULAIR) 10 MG tablet Take ONE tablet by MOUTH daily. START TWO DAYS prior TO chemo infusion. Take FOR TWO DAYS; DO NOT take ON THE DAY of infusion. 20 tablet 1  . ondansetron (ZOFRAN) 4 MG tablet Take 1 tablet (4 mg total) by mouth every 6 (six) hours as needed for nausea. 30 tablet 1  . pantoprazole (PROTONIX) 40 MG tablet Take 1 tablet (40 mg total)  by mouth daily. 90 tablet 1  . sertraline (ZOLOFT) 25 MG tablet Take 1 tablet (25 mg total) by mouth daily. 90 tablet 1   No current facility-administered medications for this visit.      Marland Kitchen  PHYSICAL EXAMINATION:  Vitals:   09/05/21 0858  BP: (!) 145/67  Pulse: 77  Temp: (!) 97.5 F (36.4 C)  SpO2: 100%   Filed Weights   09/05/21 0858  Weight: 129 lb 14.4 oz (58.9 kg)    Physical Exam Vitals and nursing note reviewed.  HENT:     Head: Normocephalic and atraumatic.     Mouth/Throat:     Pharynx: Oropharynx is clear.  Eyes:     Extraocular Movements: Extraocular movements intact.     Pupils: Pupils are equal, round, and reactive to light.  Cardiovascular:     Rate and Rhythm: Normal rate and regular rhythm.  Pulmonary:     Comments: Decreased breath sounds bilaterally.  Abdominal:     Palpations: Abdomen is soft.  Musculoskeletal:        General: Normal range of motion.     Cervical back: Normal range of motion.  Skin:    General: Skin is warm.  Neurological:     General: No focal deficit present.     Mental Status: She is alert and oriented to person, place, and time.  Psychiatric:        Behavior: Behavior normal.        Judgment: Judgment normal.     LABORATORY DATA:  I have reviewed the data as listed Lab Results  Component Value Date   WBC 4.8 09/05/2021   HGB 10.0 (L) 09/05/2021   HCT 31.3 (L) 09/05/2021   MCV 100.0 09/05/2021   PLT 303 09/05/2021   Recent Labs    02/01/21 1502 02/18/21 1615 04/15/21 1549 05/01/21 0320 08/08/21 0931 08/22/21 0854 09/05/21 0859  NA 138  --   --    < > 136 135 137  K 4.3  --   --    < > 3.8 3.9 3.5  CL 101  --   --    < > 105 100 103  CO2 23  --   --    < > '24 26 24  ' GLUCOSE 178*  --   --    < > 201* 205* 106*  BUN 19  --   --    < > '19 20 18  ' CREATININE 0.84  --   --    < > 1.05* 0.84 0.98  CALCIUM 10.7*   < >  --    < > 8.8* 9.7 9.1  GFRNONAA  --   --   --    < > 52* >60 57*  PROT  --   --   --    <  > 6.9 6.4* 7.0  ALBUMIN 4.3  --  4.4   < > 3.8 3.8 4.1  AST 16  --  17   < > '20 23 22  ' ALT 9  --  8   < > '11 13 18  ' ALKPHOS 102  --  108   < > 128* 98 90  BILITOT 0.3  --  0.3   < > 0.3 0.6 0.4  BILIDIR 0.12  --  0.10  --   --   --   --    < > = values in this interval not displayed.    RADIOGRAPHIC STUDIES: I have personally reviewed the radiological images as listed and agreed with the findings in the report. No results found.  Multiple myeloma not having achieved remission ([place]) # STAGE I- MULTIPLE MYELOMA -Active [bone lesions; hypercalcemia; anemia; No renal insuffiencey;Bone marrow-32% plasma cell-   STANDARD Cytogenetics].  Currently on Revlimid-Dex- Dara SQ. Partial response noted; MAY 25th 2023-M protein = 0.5 gm/fl; K/L= 1. 9.   #Proceed with Dara SQ today; Labs today reviewed;  acceptable for treatment today. On Revlimid at 10 mg dose 3 weeks on 1 week off. Keep dex 8 mg weekly.    # Diarrhea- improved on Revlimid 10 mg a day- 3 weeks-ON & 1 week OFF.   # Diabetes-on oral hypoglycemic agents; BG-106  on long acting insulin; 14 units of lantus on T/W/Thursday- Defer to PCP.   # HTN: continue to Hold OFF losartan; continue coreg; check BP at home; bring log to next visit.- STABLE.    #Hypercalcemia secondary to multiple myeloma- [FEB 2023-Zometa; calcitonin]-calcium today is 9.26   #DVT/shingles prophylaxis: Aspirin/acyclovir. STABLE  # IV access:PIV   [name]- [phone]  zometa-q 4w; MM; K/L q 4 W  # DISPOSITION:  # proceed with  dara SQ; Zometa today.. # follow up in 2 weeks- MD; labs- cbc/cmp;Dara SQ;-;Dr.B      All questions were answered. The patient knows to call the clinic with any problems, questions or concerns.       Cammie Sickle, MD 09/05/2021 9:46 AM

## 2021-09-05 NOTE — Assessment & Plan Note (Addendum)
#  STAGE I- MULTIPLE MYELOMA -Active [bone lesions; hypercalcemia; anemia; No renal insuffiencey;Bone marrow-32% plasma cell-   STANDARD Cytogenetics].  Currently on Revlimid-Dex- Dara SQ. Partial response noted; MAY 25th 2023-M protein = 0.5 gm/fl; K/L= 1. 9.  Awaiting myeloma panel from today.  Stable  #Proceed with Dara SQ today; Labs today reviewed;  acceptable for treatment today. On Revlimid at 10 mg dose 3 weeks on 1 week off. Keep dex 8 mg weekly.    # Diarrhea- improved on Revlimid 10 mg a day- 3 weeks-ON & 1 week OFF.  Stable  # Diabetes-on oral hypoglycemic agents; BG-106  on long acting insulin; 14 units of lantus on T/W/Thursday- Defer to PCP.   # HTN: continue to Hold OFF losartan; continue coreg; check BP at home; bring log to next visit.- STABLE.    #Hypercalcemia secondary to multiple myeloma- [FEB 2023-Zometa ; calcitonin]-calcium today is 9.26. Zometa q 4W;  proceed with Zometa today.  #DVT/shingles prophylaxis: Aspirin/acyclovir. STABLE  # IV access:PIV   * [place]- [phone]  zometa-q 4w; MM; K/L q 4 W  # DISPOSITION:  # proceed with  dara SQ; Zometa today.. # follow up in 2 weeks- MD; labs- cbc/cmp;Dara SQ;-;Dr.B    

## 2021-09-05 NOTE — Progress Notes (Signed)
Patient tolerated Zometa infusion well, Dara SQ also given. No questions/concerns voiced. Patient stable at discharge. AVS given.

## 2021-09-05 NOTE — Patient Instructions (Signed)
[facility] CANCER CTR AT [place]  Discharge Instructions: Thank you for choosing [facility] to provide your oncology and hematology care.  If you have a lab appointment with the [facility], please go directly to the [place] and check in at the registration area.  Wear comfortable clothing and clothing appropriate for easy access to any Portacath or PICC line.   We strive to give you quality time with your provider. You may need to reschedule your appointment if you arrive late (15 or more minutes).  Arriving late affects you and other patients whose appointments are after yours.  Also, if you miss three or more appointments without notifying the office, you may be dismissed from the clinic at the provider's discretion.      For prescription refill requests, have your pharmacy contact our office and allow 72 hours for refills to be completed.    Today you received the following chemotherapy and/or immunotherapy agents: Dara/Zometa   To help prevent nausea and vomiting after your treatment, we encourage you to take your nausea medication as directed.  BELOW ARE SYMPTOMS THAT SHOULD BE REPORTED IMMEDIATELY: *FEVER GREATER THAN 100.4 F (38 C) OR HIGHER *CHILLS OR SWEATING *NAUSEA AND VOMITING THAT IS NOT CONTROLLED WITH YOUR NAUSEA MEDICATION *UNUSUAL SHORTNESS OF BREATH *UNUSUAL BRUISING OR BLEEDING *URINARY PROBLEMS (pain or burning when urinating, or frequent urination) *BOWEL PROBLEMS (unusual diarrhea, constipation, pain near the anus) TENDERNESS IN MOUTH AND THROAT WITH OR WITHOUT PRESENCE OF ULCERS (sore throat, sores in mouth, or a toothache) UNUSUAL RASH, SWELLING OR PAIN  UNUSUAL VAGINAL DISCHARGE OR ITCHING   Items with * indicate a potential emergency and should be followed up as soon as possible or go to the Emergency Department if any problems should occur.  Please show the CHEMOTHERAPY ALERT CARD or IMMUNOTHERAPY ALERT CARD at check-in to  the Emergency Department and triage nurse.  Should you have questions after your visit or need to cancel or reschedule your appointment, please contact [facility]CER [place] AT [place]  [phone] and follow the prompts.  Office hours are 8:00 a.m. to 4:30 p.m. Monday - Friday. Please note that voicemails left after 4:00 p.m. may not be returned until the following business day.  We are closed weekends and major holidays. You have access to a nurse at all times for urgent questions. Please call the main number to the clinic [phone] and follow the prompts.  For any non-urgent questions, you may also contact your provider using MyChart. We now offer e-Visits for anyone 31 and older to request care online for non-urgent symptoms. For details visit [email or web address].   Also download the MyChart app! Go to the app store, search "MyChart", open the app, select [place], and log in with your MyChart username and password.  Masks are optional in the cancer centers. If you would like for your care team to wear a mask while they are taking care of you, please let them know. For doctor visits, patients may have with them one support person who is at least 85 years old. At this time, visitors are not allowed in the infusion area.

## 2021-09-06 ENCOUNTER — Encounter: Payer: Self-pay | Admitting: Internal Medicine

## 2021-09-06 LAB — KAPPA/LAMBDA LIGHT CHAINS
Kappa free light chain: 24.5 mg/L — ABNORMAL HIGH (ref 3.3–19.4)
Kappa, lambda light chain ratio: 1.44 (ref 0.26–1.65)
Lambda free light chains: 17 mg/L (ref 5.7–26.3)

## 2021-09-10 DIAGNOSIS — E113393 Type 2 diabetes mellitus with moderate nonproliferative diabetic retinopathy without macular edema, bilateral: Secondary | ICD-10-CM | POA: Diagnosis not present

## 2021-09-10 LAB — OPHTHALMOLOGY REPORT-SCANNED

## 2021-09-11 LAB — MULTIPLE MYELOMA PANEL, SERUM
Albumin SerPl Elph-Mcnc: 3.8 g/dL (ref 2.9–4.4)
Albumin/Glob SerPl: 1.6 (ref 0.7–1.7)
Alpha 1: 0.2 g/dL (ref 0.0–0.4)
Alpha2 Glob SerPl Elph-Mcnc: 0.7 g/dL (ref 0.4–1.0)
B-Globulin SerPl Elph-Mcnc: 0.9 g/dL (ref 0.7–1.3)
Gamma Glob SerPl Elph-Mcnc: 0.7 g/dL (ref 0.4–1.8)
Globulin, Total: 2.5 g/dL (ref 2.2–3.9)
IgA: 62 mg/dL — ABNORMAL LOW (ref 64–422)
IgG (Immunoglobin G), Serum: 750 mg/dL (ref 586–1602)
IgM (Immunoglobulin M), Srm: 31 mg/dL (ref 26–217)
M Protein SerPl Elph-Mcnc: 0.3 g/dL — ABNORMAL HIGH
Total Protein ELP: 6.3 g/dL (ref 6.0–8.5)

## 2021-09-18 ENCOUNTER — Other Ambulatory Visit: Payer: Self-pay | Admitting: Internal Medicine

## 2021-09-19 ENCOUNTER — Inpatient Hospital Stay: Payer: Medicare Other | Attending: Nurse Practitioner

## 2021-09-19 ENCOUNTER — Inpatient Hospital Stay (HOSPITAL_BASED_OUTPATIENT_CLINIC_OR_DEPARTMENT_OTHER): Payer: Medicare Other | Admitting: Internal Medicine

## 2021-09-19 ENCOUNTER — Inpatient Hospital Stay: Payer: Medicare Other

## 2021-09-19 ENCOUNTER — Encounter: Payer: Self-pay | Admitting: Internal Medicine

## 2021-09-19 VITALS — BP 147/71 | HR 72 | Temp 97.9°F | Resp 16

## 2021-09-19 DIAGNOSIS — R197 Diarrhea, unspecified: Secondary | ICD-10-CM | POA: Diagnosis not present

## 2021-09-19 DIAGNOSIS — C9000 Multiple myeloma not having achieved remission: Secondary | ICD-10-CM

## 2021-09-19 DIAGNOSIS — Z5112 Encounter for antineoplastic immunotherapy: Secondary | ICD-10-CM | POA: Diagnosis not present

## 2021-09-19 DIAGNOSIS — I10 Essential (primary) hypertension: Secondary | ICD-10-CM | POA: Diagnosis not present

## 2021-09-19 DIAGNOSIS — D649 Anemia, unspecified: Secondary | ICD-10-CM | POA: Insufficient documentation

## 2021-09-19 LAB — CBC WITH DIFFERENTIAL/PLATELET
Abs Immature Granulocytes: 0.01 10*3/uL (ref 0.00–0.07)
Basophils Absolute: 0.1 10*3/uL (ref 0.0–0.1)
Basophils Relative: 1 %
Eosinophils Absolute: 0 10*3/uL (ref 0.0–0.5)
Eosinophils Relative: 0 %
HCT: 31.4 % — ABNORMAL LOW (ref 36.0–46.0)
Hemoglobin: 10.1 g/dL — ABNORMAL LOW (ref 12.0–15.0)
Immature Granulocytes: 0 %
Lymphocytes Relative: 31 %
Lymphs Abs: 1.8 10*3/uL (ref 0.7–4.0)
MCH: 31.8 pg (ref 26.0–34.0)
MCHC: 32.2 g/dL (ref 30.0–36.0)
MCV: 98.7 fL (ref 80.0–100.0)
Monocytes Absolute: 1.1 10*3/uL — ABNORMAL HIGH (ref 0.1–1.0)
Monocytes Relative: 19 %
Neutro Abs: 2.8 10*3/uL (ref 1.7–7.7)
Neutrophils Relative %: 49 %
Platelets: 389 10*3/uL (ref 150–400)
RBC: 3.18 MIL/uL — ABNORMAL LOW (ref 3.87–5.11)
RDW: 15 % (ref 11.5–15.5)
WBC: 5.8 10*3/uL (ref 4.0–10.5)
nRBC: 0 % (ref 0.0–0.2)

## 2021-09-19 LAB — COMPREHENSIVE METABOLIC PANEL
ALT: 14 U/L (ref 0–44)
AST: 21 U/L (ref 15–41)
Albumin: 3.9 g/dL (ref 3.5–5.0)
Alkaline Phosphatase: 81 U/L (ref 38–126)
Anion gap: 8 (ref 5–15)
BUN: 29 mg/dL — ABNORMAL HIGH (ref 8–23)
CO2: 23 mmol/L (ref 22–32)
Calcium: 9.8 mg/dL (ref 8.9–10.3)
Chloride: 102 mmol/L (ref 98–111)
Creatinine, Ser: 1.05 mg/dL — ABNORMAL HIGH (ref 0.44–1.00)
GFR, Estimated: 52 mL/min — ABNORMAL LOW (ref >60)
Glucose, Bld: 199 mg/dL — ABNORMAL HIGH (ref 70–99)
Potassium: 4 mmol/L (ref 3.5–5.1)
Sodium: 133 mmol/L — ABNORMAL LOW (ref 135–145)
Total Bilirubin: 0.5 mg/dL (ref 0.3–1.2)
Total Protein: 6.6 g/dL (ref 6.5–8.1)

## 2021-09-19 MED ORDER — DIPHENHYDRAMINE HCL 25 MG PO CAPS
50.0000 mg | ORAL_CAPSULE | Freq: Once | ORAL | Status: AC
  Administered 2021-09-19: 50 mg via ORAL
  Filled 2021-09-19: qty 2

## 2021-09-19 MED ORDER — DARATUMUMAB-HYALURONIDASE-FIHJ 1800-30000 MG-UT/15ML ~~LOC~~ SOLN
1800.0000 mg | Freq: Once | SUBCUTANEOUS | Status: AC
  Administered 2021-09-19: 1800 mg via SUBCUTANEOUS
  Filled 2021-09-19: qty 15

## 2021-09-19 MED ORDER — DEXAMETHASONE 4 MG PO TABS
20.0000 mg | ORAL_TABLET | Freq: Once | ORAL | Status: AC
  Administered 2021-09-19: 20 mg via ORAL
  Filled 2021-09-19: qty 5

## 2021-09-19 MED ORDER — ACETAMINOPHEN 325 MG PO TABS
650.0000 mg | ORAL_TABLET | Freq: Once | ORAL | Status: AC
  Administered 2021-09-19: 650 mg via ORAL
  Filled 2021-09-19: qty 2

## 2021-09-19 NOTE — Progress Notes (Signed)
Pt tolerated Dara SQ without incident. Monitored for 15 min post injection. VSS. AVS given

## 2021-09-19 NOTE — Progress Notes (Signed)
Cristina Morgan NOTE  Patient Care Team: Juline Patch, MD as PCP - General (Family Medicine) Vladimir Faster, [facility] (Inactive) (Pharmacist) Cammie Sickle, MD as Consulting Physician (Oncology)  CHIEF COMPLAINTS/PURPOSE OF CONSULTATION: Multiple myeloma  Oncology History Overview Note  # MULTIPLE MYELOMA  [FEB 2023-hypercalcemia status changes]-Active [bone lesions; hypercalcemia; anemia; No renal insuffiencey;Bone marrow-32% plasma cell-   STANDARD Cytogenetics].FEB 2023- S/p dexamethasone 20 mg q day x4.   # REV [15 mg -3 week-On and 1 week-OFF]-DARA; May 2023-discontinued rev 15 diarrhea/hypotension  # MAY 2023- dara-Rev 10 mg 3w/1w   # FEB 2023-hypercalcemia [[facility]-status post calcitonin; bisphosphonate]  BONE MARROW, ASPIRATE, CLOT, CORE:  -Hypercellular bone marrow with plasma cell neoplasm  -See comment   PERIPHERAL BLOOD:  -Normocytic-normochromic anemia   COMMENT:   The bone marrow is hypercellular for age with increased number of  atypical plasma cells representing 32% of all cells in the aspirate  associated with interstitial infiltrates and numerous variably sized  clusters in the clot/biopsy sections.  The plasma cells display weak  kappa light chain restriction consistent with plasma cell neoplasm.  Correlation with cytogenetic and FISH studies is recommended   MICROSCOPIC DESCRIPTION:   PERIPHERAL BLOOD SMEAR: The red blood cells display mild  anisopoikilocytosis with mild polychromasia.  The white blood cells are  normal number with scattered hypogranular neutrophils. An occasional  myelocyte and large atypical mononuclear cells are seen on scan. The  platelets are normal in number.    Multiple myeloma not having achieved remission ([place])  05/15/2021 Initial Diagnosis   Multiple myeloma not having achieved remission ([place])   06/27/2021 -  Chemotherapy   Patient is on Treatment Plan : MYELOMA Daratumumab SQ + Lenalidomide +  Dexamethasone (DaraRd) q28d     07/11/2021 Cancer Staging   Staging form: Plasma Cell Myeloma and Plasma Cell Disorders, AJCC 8th Edition - Clinical: Beta-2-microglobulin (mg/L): 2.6, Albumin (g/dL): 3.9, ISS: Stage I - Signed by Cammie Sickle, MD on 07/11/2021 Stage prefix: Initial diagnosis Beta 2 microglobulin range (mg/L): Less than 3.5 Albumin range (g/dL): Greater than or equal to 3.5     HISTORY OF PRESENTING ILLNESS: pt in a wheel chair; accompanied by her daughter.   Cristina Morgan 87 y.o.  female with  multiple myeloma currently on Rev-Dex is proceed with Dara SQ today.  Patient continues to be on 10 mg of Revlimid 3 weeks on 1 week off.  No significant diarrhea.  No skin rash.  Her appetite is better.  Blood sugars are better controlled.  States blood sugars are slightly lower 80s to 90s fasting.  She is gaining weight. Patient states her back pain is improved.  Denies any worsening joint pains.  No falls.  No further episodes of mental status changes.    Review of Systems  Constitutional:  Positive for malaise/fatigue and weight loss. Negative for chills, diaphoresis and fever.  HENT:  Negative for nosebleeds and sore throat.   Eyes:  Negative for double vision.  Respiratory:  Negative for cough, hemoptysis, sputum production, shortness of breath and wheezing.   Cardiovascular:  Negative for chest pain, palpitations, orthopnea and leg swelling.  Gastrointestinal:  Negative for abdominal pain, blood in stool, constipation, diarrhea, heartburn, melena, nausea and vomiting.  Genitourinary:  Negative for dysuria, frequency and urgency.  Musculoskeletal:  Positive for back pain and joint pain.  Skin: Negative.  Negative for itching and rash.  Neurological:  Negative for dizziness, tingling, focal weakness, weakness and headaches.  Endo/Heme/Allergies:  Does not bruise/bleed easily.  Psychiatric/Behavioral:  Negative for depression. The patient is not nervous/anxious  and does not have insomnia.      MEDICAL HISTORY:  Past Medical History:  Diagnosis Date   Anemia    CAD (coronary artery disease)    Cataracts, bilateral    Closed compression fracture of first lumbar vertebra (HCC)    Closed compression fracture of second lumbar vertebra (HCC)    Diabetes mellitus without complication (HCC)    High cholesterol    History of pneumonia 2010   [place]   History of uterine fibroid    Hypercalcemia    Hyperlipidemia    Hypertension    Lytic bone lesions on xray    Multiple myeloma (HCC)    Osteoporosis    Primary osteoarthritis of right knee     SURGICAL HISTORY: Past Surgical History:  Procedure Laterality Date   CATARACT EXTRACTION Bilateral 2014   COLONOSCOPY  2011   cleared for 5 yrs- [facility]   ECTOPIC PREGNANCY SURGERY      SOCIAL HISTORY: Social History   Socioeconomic History   Marital status: Married    Spouse name: Not on file   Number of children: 2   Years of education: Not on file   Highest education level: 12th grade  Occupational History   Occupation: Retired  Tobacco Use   Smoking status: Never    Passive exposure: Never   Smokeless tobacco: Never   Tobacco comments:    smoking cessation materials not required  Vaping Use   Vaping Use: Never used  Substance and Sexual Activity   Alcohol use: No   Drug use: No   Sexual activity: Not Currently  Other Topics Concern   Not on file  Social History Narrative   [place] with husband; drives. Never smoked; no alcohol. Daughters live close.    Social Determinants of Health   Financial Resource Strain: Low Risk  (06/12/2021)   Overall Financial Resource Strain (CARDIA)    Difficulty of Paying Living Expenses: Not very hard  Food Insecurity: No Food Insecurity (06/12/2021)   Hunger Vital Sign    Worried About Running Out of Food in the Last Year: Never true    Ran Out of Food in the Last Year: Never true  Transportation Needs: No Transportation Needs  (06/12/2021)   PRAPARE - Hydrologist (Medical): No    Lack of Transportation (Non-Medical): No  Physical Activity: Inactive (06/12/2021)   Exercise Vital Sign    Days of Exercise per Week: 0 days    Minutes of Exercise per Session: 0 min  Stress: No Stress Concern Present (06/12/2021)   [place]    Feeling of Stress : Only a little  Social Connections: Moderately Integrated (06/12/2021)   Social Connection and Isolation Panel [NHANES]    Frequency of Communication with Friends and Family: Three times a week    Frequency of Social Gatherings with Friends and Family: Three times a week    Attends Religious Services: 1 to 4 times per year    Active Member of Clubs or Organizations: No    Attends Archivist Meetings: Never    Marital Status: Married  Human resources officer Violence: Not At Risk (06/12/2021)   Humiliation, Afraid, Rape, and Kick questionnaire    Fear of Current or Ex-Partner: No    Emotionally Abused: No    Physically Abused: No  Sexually Abused: No    FAMILY HISTORY: Family History  Problem Relation Age of Onset   Cancer Mother        breast   Heart disease Mother    Diabetes Father    Heart disease Father    Heart attack Father    Heart attack Brother    Diabetes Brother     ALLERGIES:  is allergic to latex and penicillins.  MEDICATIONS:  Current Outpatient Medications  Medication Sig Dispense Refill   ACCU-CHEK GUIDE test strip USE 1 STRIP TO CHECK GLUCOSE ONCE DAILY AS DIRECTED     Accu-Chek Softclix Lancets lancets USE 1 TO CHECK GLUCOSE ONCE DAILY     acetaminophen (TYLENOL) 325 MG tablet Take 325 mg by mouth 2 (two) times daily. START 2 days prior to chemo infusion. Take For 2 days; Do NOT take on the day of infusion     acyclovir (ZOVIRAX) 400 MG tablet Take 1 tablet (400 mg total) by mouth 2 (two) times daily. 60 tablet 4   BD PEN NEEDLE NANO 2ND GEN  32G X 4 MM MISC USE 1 ONCE DAILY     Blood Glucose Monitoring Suppl (ACCU-CHEK GUIDE) w/Device KIT USE AS DIRECTED TO CHECK GLUCOSE     Calcium Carbonate-Vit D-Min (CALCIUM 1200 PO) Take 1 capsule by mouth daily.     carvedilol (COREG) 3.125 MG tablet Take 1 tablet (3.125 mg total) by mouth 2 (two) times daily with a meal. 60 tablet 0   cholecalciferol (VITAMIN D3) 25 MCG (1000 UNIT) tablet Take 1,000 Units by mouth daily.     dexamethasone (DECADRON) 4 MG tablet Take 1 tablet (4 mg total) by mouth 2 (two) times daily. Start 2 days prior to infusion; Take it for 2 days. 60 tablet 3   diphenoxylate-atropine (LOMOTIL) 2.5-0.025 MG tablet Take 1 tablet by mouth 4 (four) times daily as needed for diarrhea or loose stools. Take it along with immodium 60 tablet 0   Glucerna (GLUCERNA) LIQD Take 237 mLs by mouth.     hydrOXYzine (ATARAX) 10 MG tablet Take 1 tablet (10 mg total) by mouth at bedtime as needed. 90 tablet 1   insulin glargine (LANTUS SOLOSTAR) 100 UNIT/ML Solostar Pen Inject 14 Units into the skin daily. 12 units every day except Wed and Thursday- 14 units 3 mL 0   lenalidomide (REVLIMID) 10 MG capsule Take 1 capsule (10 mg total) by mouth daily. Take for 21 days, then hold for 7 days. Repeat every 28 days. 21 capsule 3   losartan (COZAAR) 25 MG tablet TAKE ONE TABLET BY MOUTH EVERY EVENING 90 tablet 0   lovastatin (MEVACOR) 40 MG tablet Take 1 tablet (40 mg total) by mouth every evening. 90 tablet 1   metFORMIN (GLUCOPHAGE) 1000 MG tablet TAKE ONE TABLET BY MOUTH TWICE DAILY 180 tablet 0   montelukast (SINGULAIR) 10 MG tablet Take ONE tablet by MOUTH daily. START TWO DAYS prior TO chemo infusion. Take FOR TWO DAYS; DO NOT take ON THE DAY of infusion. 20 tablet 1   ondansetron (ZOFRAN) 4 MG tablet Take 1 tablet (4 mg total) by mouth every 6 (six) hours as needed for nausea. 30 tablet 1   pantoprazole (PROTONIX) 40 MG tablet Take 1 tablet (40 mg total) by mouth daily. 90 tablet 1   sertraline  (ZOLOFT) 25 MG tablet Take 1 tablet (25 mg total) by mouth daily. 90 tablet 1   No current facility-administered medications for this visit.      Marland Kitchen  PHYSICAL EXAMINATION:  Vitals:   09/19/21 0900  BP: (!) 149/68  Pulse: 77  Resp: 16  Temp: 98.7 F (37.1 C)   Filed Weights   09/19/21 0900  Weight: 128 lb 14.4 oz (58.5 kg)    Physical Exam Vitals and nursing note reviewed.  HENT:     Head: Normocephalic and atraumatic.     Mouth/Throat:     Pharynx: Oropharynx is clear.  Eyes:     Extraocular Movements: Extraocular movements intact.     Pupils: Pupils are equal, round, and reactive to light.  Cardiovascular:     Rate and Rhythm: Normal rate and regular rhythm.  Pulmonary:     Comments: Decreased breath sounds bilaterally.  Abdominal:     Palpations: Abdomen is soft.  Musculoskeletal:        General: Normal range of motion.     Cervical back: Normal range of motion.  Skin:    General: Skin is warm.  Neurological:     General: No focal deficit present.     Mental Status: She is alert and oriented to person, place, and time.  Psychiatric:        Behavior: Behavior normal.        Judgment: Judgment normal.      LABORATORY DATA:  I have reviewed the data as listed Lab Results  Component Value Date   WBC 5.8 09/19/2021   HGB 10.1 (L) 09/19/2021   HCT 31.4 (L) 09/19/2021   MCV 98.7 09/19/2021   PLT 389 09/19/2021   Recent Labs    02/01/21 1502 02/18/21 1615 04/15/21 1549 05/01/21 0320 08/22/21 0854 09/05/21 0859 09/19/21 0837  NA 138  --   --    < > 135 137 133*  K 4.3  --   --    < > 3.9 3.5 4.0  CL 101  --   --    < > 100 103 102  CO2 23  --   --    < > _0 GLUCOSE 178*  --   --    < > 205* 106* 199*  BUN 19  --   --    < > 20 18 29*  CREATININE 0.84  --   --    < > 0.84 0.98 1.05*  CALCIUM 10.7*   < >  --    < > 9.7 9.1 9.8  GFRNONAA  --   --   --    < > >60 57* 52*  PROT  --   --   --    < > 6.4* 7.0 6.6  ALBUMIN 4.3  --  4.4   < >  3.8 4.1 3.9  AST 16  --  17   < > _1 ALT 9  --  8   < > _2 ALKPHOS 102  --  108   < > 98 90 81  BILITOT 0.3  --  0.3   < > 0.6 0.4 0.5  BILIDIR 0.12  --  0.10  --   --   --   --    < > = values in this interval not displayed.    RADIOGRAPHIC STUDIES: I have personally reviewed the radiological images as listed and agreed with the findings in the report. No results found.  Multiple myeloma not having achieved remission ([place]) # STAGE I- MULTIPLE MYELOMA -Active [bone lesions; hypercalcemia; anemia; No renal insuffiencey;Bone marrow-32% plasma cell-  STANDARD Cytogenetics].  Currently on Revlimid-Dex- Dara SQ. Partial response noted; JUNE  2023-M protein = 0.3 gm/fl; K/L= 1. 4.  STABLE.  #Proceed with Dara SQ today; Labs today reviewed;  acceptable for treatment today. On Revlimid at 10 mg dose 3 weeks on 1 week off. Keep dex 8 mg weekly.    # Diarrhea- improved on Revlimid 10 mg a day- 3 weeks-ON & 1 week OFF.  Stable  # Diabetes-on oral hypoglycemic agents; on long acting insulin; 14 units of lantus on T/W/Thursday- Defer to PCP.PBF- BG-199  Again recommend inform PCP regarding blood sugars log.  # HTN: continue to Hold OFF losartan; continue coreg; check BP at home; bring log to next visit.- STABLE.    #Hypercalcemia secondary to multiple myeloma- [FEB 2023-Zometa ; calcitonin]-calcium today is 9.26. Zometa q 4W;  proceed with Zometa today.  #DVT/shingles prophylaxis: Aspirin/acyclovir. STABLE  # IV access:PIV   Cristina Morgan- [phone]  zometa-q 4w; MM; K/L q 4 W  # DISPOSITION:  # proceed with  dara SQ;  # follow up in 2 weeks- MD; labs- cbc/cmp;MM panel; Kappa-light chains; iron studies; ferritin- Dara SQ;-;Dr.B      All questions were answered. The patient knows to call the clinic with any problems, questions or concerns.       Cammie Sickle, MD 09/19/2021 9:25 AM

## 2021-09-19 NOTE — Assessment & Plan Note (Addendum)
#  STAGE I- MULTIPLE MYELOMA -Active [bone lesions; hypercalcemia; anemia; No renal insuffiencey;Bone marrow-32% plasma cell-   STANDARD Cytogenetics].  Currently on Revlimid-Dex- Dara SQ. Partial response noted; JUNE  2023-M protein = 0.3 gm/fl; K/L= 1. 4.  STABLE.  #Proceed with Dara SQ today; Labs today reviewed;  acceptable for treatment today. On Revlimid at 10 mg dose 3 weeks on 1 week off. Keep dex 8 mg weekly.    # Diarrhea- improved on Revlimid 10 mg a day- 3 weeks-ON & 1 week OFF.  Stable  # Diabetes-on oral hypoglycemic agents; on long acting insulin; 14 units of lantus on T/W/Thursday- Defer to PCP.PBF- BG-199  Again recommend inform PCP regarding blood sugars log.  # HTN: continue to Hold OFF losartan; continue coreg; check BP at home; bring log to next visit.- STABLE.    #Hypercalcemia secondary to multiple myeloma- [FEB 2023-Zometa ; calcitonin]-calcium today is 9.26. Zometa q 4W;  proceed with Zometa today.  #DVT/shingles prophylaxis: Aspirin/acyclovir. STABLE  # IV access:PIV   [name]- [phone]  zometa-q 4w; MM; K/L q 4 W  # DISPOSITION:  # proceed with  dara SQ;  # follow up in 2 weeks- MD; labs- cbc/cmp;MM panel; Kappa-light chains; iron studies; ferritin- Dara SQ;-;Dr.B

## 2021-09-19 NOTE — Patient Instructions (Signed)
[facility] AT [place]-MEDICAL ONCOLOGY  Discharge Instructions: Thank you for choosing [facility] to provide your oncology and hematology care.  If you have a lab appointment with the Cancer Center, please go directly to the Cancer Center and check in at the registration area.  Wear comfortable clothing and clothing appropriate for easy access to any Portacath or PICC line.   We strive to give you quality time with your provider. You may need to reschedule your appointment if you arrive late (15 or more minutes).  Arriving late affects you and other patients whose appointments are after yours.  Also, if you miss three or more appointments without notifying the office, you may be dismissed from the clinic at the provider's discretion.      For prescription refill requests, have your pharmacy contact our office and allow 72 hours for refills to be completed.    Today you received the following chemotherapy and/or immunotherapy agents Darzalex      To help prevent nausea and vomiting after your treatment, we encourage you to take your nausea medication as directed.  BELOW ARE SYMPTOMS THAT SHOULD BE REPORTED IMMEDIATELY: *FEVER GREATER THAN 100.4 F (38 C) OR HIGHER *CHILLS OR SWEATING *NAUSEA AND VOMITING THAT IS NOT CONTROLLED WITH YOUR NAUSEA MEDICATION *UNUSUAL SHORTNESS OF BREATH *UNUSUAL BRUISING OR BLEEDING *URINARY PROBLEMS (pain or burning when urinating, or frequent urination) *BOWEL PROBLEMS (unusual diarrhea, constipation, pain near the anus) TENDERNESS IN MOUTH AND THROAT WITH OR WITHOUT PRESENCE OF ULCERS (sore throat, sores in mouth, or a toothache) UNUSUAL RASH, SWELLING OR PAIN  UNUSUAL VAGINAL DISCHARGE OR ITCHING   Items with * indicate a potential emergency and should be followed up as soon as possible or go to the Emergency Department if any problems should occur.  Please show the CHEMOTHERAPY ALERT CARD or IMMUNOTHERAPY ALERT CARD at check-in to  the Emergency Department and triage nurse.  Should you have questions after your visit or need to cancel or reschedule your appointment, please contact [facility] AT [place]-MEDICAL ONCOLOGY  [phone] and follow the prompts.  Office hours are 8:00 a.m. to 4:30 p.m. Monday - Friday. Please note that voicemails left after 4:00 p.m. may not be returned until the following business day.  We are closed weekends and major holidays. You have access to a nurse at all times for urgent questions. Please call the main number to the clinic [phone] and follow the prompts.  For any non-urgent questions, you may also contact your provider using MyChart. We now offer e-Visits for anyone 18 and older to request care online for non-urgent symptoms. For details visit [email or web address].   Also download the MyChart app! Go to the app store, search "MyChart", open the app, select [place], and log in with your MyChart username and password.  Masks are optional in the cancer centers. If you would like for your care team to wear a mask while they are taking care of you, please let them know. For doctor visits, patients may have with them one support person who is at least 85 years old. At this time, visitors are not allowed in the infusion area.   

## 2021-09-19 NOTE — Progress Notes (Signed)
Not sleeping well at night.  Hydroxyzine did help when first started but seems to not work as well.

## 2021-09-23 ENCOUNTER — Ambulatory Visit: Payer: Medicare Other | Admitting: Family Medicine

## 2021-09-24 ENCOUNTER — Encounter: Payer: Self-pay | Admitting: Internal Medicine

## 2021-09-25 ENCOUNTER — Other Ambulatory Visit: Payer: Self-pay | Admitting: Pharmacist

## 2021-09-25 DIAGNOSIS — C9000 Multiple myeloma not having achieved remission: Secondary | ICD-10-CM

## 2021-09-25 MED ORDER — LENALIDOMIDE 10 MG PO CAPS: 10.0000 mg | ORAL_CAPSULE | Freq: Every day | ORAL | 3 refills | Status: DC

## 2021-09-25 NOTE — Progress Notes (Signed)
Oral Chemotherapy Pharmacist Encounter   To align with her infusion treatment cycle, Cristina Morgan will restart her Revlimid on 10/03/21. Patient's daughter [name] is aware of the plan and knows when to get her mom restarted on treatment.    Darl Pikes, PharmD, BCPS, BCOP, CPP Hematology/Oncology Clinical Pharmacist [place]/DB/AP [facility] [phone]  09/25/2021 11:59 AM

## 2021-10-03 ENCOUNTER — Inpatient Hospital Stay (HOSPITAL_BASED_OUTPATIENT_CLINIC_OR_DEPARTMENT_OTHER): Payer: Medicare Other | Admitting: Internal Medicine

## 2021-10-03 ENCOUNTER — Inpatient Hospital Stay: Payer: Medicare Other

## 2021-10-03 ENCOUNTER — Encounter: Payer: Self-pay | Admitting: Internal Medicine

## 2021-10-03 VITALS — BP 190/86 | HR 70 | Temp 97.4°F | Resp 17

## 2021-10-03 DIAGNOSIS — C9000 Multiple myeloma not having achieved remission: Secondary | ICD-10-CM

## 2021-10-03 DIAGNOSIS — I10 Essential (primary) hypertension: Secondary | ICD-10-CM | POA: Diagnosis not present

## 2021-10-03 DIAGNOSIS — Z5112 Encounter for antineoplastic immunotherapy: Secondary | ICD-10-CM | POA: Diagnosis not present

## 2021-10-03 DIAGNOSIS — R197 Diarrhea, unspecified: Secondary | ICD-10-CM | POA: Diagnosis not present

## 2021-10-03 DIAGNOSIS — D649 Anemia, unspecified: Secondary | ICD-10-CM | POA: Diagnosis not present

## 2021-10-03 LAB — CBC WITH DIFFERENTIAL/PLATELET
Abs Immature Granulocytes: 0.03 10*3/uL (ref 0.00–0.07)
Basophils Absolute: 0 10*3/uL (ref 0.0–0.1)
Basophils Relative: 0 %
Eosinophils Absolute: 0 10*3/uL (ref 0.0–0.5)
Eosinophils Relative: 0 %
HCT: 30.4 % — ABNORMAL LOW (ref 36.0–46.0)
Hemoglobin: 10 g/dL — ABNORMAL LOW (ref 12.0–15.0)
Immature Granulocytes: 0 %
Lymphocytes Relative: 27 %
Lymphs Abs: 2.6 10*3/uL (ref 0.7–4.0)
MCH: 32.3 pg (ref 26.0–34.0)
MCHC: 32.9 g/dL (ref 30.0–36.0)
MCV: 98.1 fL (ref 80.0–100.0)
Monocytes Absolute: 0.7 10*3/uL (ref 0.1–1.0)
Monocytes Relative: 7 %
Neutro Abs: 6.2 10*3/uL (ref 1.7–7.7)
Neutrophils Relative %: 66 %
Platelets: 383 10*3/uL (ref 150–400)
RBC: 3.1 MIL/uL — ABNORMAL LOW (ref 3.87–5.11)
RDW: 15.4 % (ref 11.5–15.5)
WBC: 9.6 10*3/uL (ref 4.0–10.5)
nRBC: 0 % (ref 0.0–0.2)

## 2021-10-03 LAB — COMPREHENSIVE METABOLIC PANEL
ALT: 15 U/L (ref 0–44)
AST: 26 U/L (ref 15–41)
Albumin: 4.2 g/dL (ref 3.5–5.0)
Alkaline Phosphatase: 72 U/L (ref 38–126)
Anion gap: 6 (ref 5–15)
BUN: 28 mg/dL — ABNORMAL HIGH (ref 8–23)
CO2: 24 mmol/L (ref 22–32)
Calcium: 9.7 mg/dL (ref 8.9–10.3)
Chloride: 104 mmol/L (ref 98–111)
Creatinine, Ser: 0.94 mg/dL (ref 0.44–1.00)
GFR, Estimated: 59 mL/min — ABNORMAL LOW (ref >60)
Glucose, Bld: 122 mg/dL — ABNORMAL HIGH (ref 70–99)
Potassium: 3.9 mmol/L (ref 3.5–5.1)
Sodium: 134 mmol/L — ABNORMAL LOW (ref 135–145)
Total Bilirubin: 0.6 mg/dL (ref 0.3–1.2)
Total Protein: 6.8 g/dL (ref 6.5–8.1)

## 2021-10-03 LAB — FERRITIN: Ferritin: 67 ng/mL (ref 11–307)

## 2021-10-03 LAB — IRON AND TIBC
Iron: 50 ug/dL (ref 28–170)
Saturation Ratios: 13 % (ref 10.4–31.8)
TIBC: 393 ug/dL (ref 250–450)
UIBC: 343 ug/dL

## 2021-10-03 MED ORDER — DIPHENHYDRAMINE HCL 25 MG PO CAPS
50.0000 mg | ORAL_CAPSULE | Freq: Once | ORAL | Status: AC
  Administered 2021-10-03: 50 mg via ORAL
  Filled 2021-10-03: qty 2

## 2021-10-03 MED ORDER — ACETAMINOPHEN 325 MG PO TABS
650.0000 mg | ORAL_TABLET | Freq: Once | ORAL | Status: AC
  Administered 2021-10-03: 650 mg via ORAL
  Filled 2021-10-03: qty 2

## 2021-10-03 MED ORDER — DARATUMUMAB-HYALURONIDASE-FIHJ 1800-30000 MG-UT/15ML ~~LOC~~ SOLN
1800.0000 mg | Freq: Once | SUBCUTANEOUS | Status: AC
  Administered 2021-10-03: 1800 mg via SUBCUTANEOUS
  Filled 2021-10-03: qty 15

## 2021-10-03 MED ORDER — DEXAMETHASONE 4 MG PO TABS
20.0000 mg | ORAL_TABLET | Freq: Once | ORAL | Status: AC
  Administered 2021-10-03: 20 mg via ORAL
  Filled 2021-10-03: qty 5

## 2021-10-03 NOTE — Progress Notes (Signed)
5361: Per Dr. Rogue Bussing, no monitoring required post Darzalex injection.   1125: Pt denies any s/s of hypertension. Pt states that DR. Brahmanday instructed her to restart her B/P medication today.  Per Dr. Rogue Bussing okay to dicsharge home and pt and patient's daughter aware to restart losartan.  Patient and patient's daughter educated to restart Losartan as directed today and recheck/monitor B/P at home. Pt to report to ER if any symptoms develop. Both verbalize understanding. Pt stable at discharge.

## 2021-10-03 NOTE — Patient Instructions (Signed)
[facility]  Discharge Instructions: Thank you for choosing [facility] to provide your oncology and hematology care.  If you have a lab appointment with the Cancer Center, please go directly to the Cancer Center and check in at the registration area.  Wear comfortable clothing and clothing appropriate for easy access to any Portacath or PICC line.   We strive to give you quality time with your provider. You may need to reschedule your appointment if you arrive late (15 or more minutes).  Arriving late affects you and other patients whose appointments are after yours.  Also, if you miss three or more appointments without notifying the office, you may be dismissed from the clinic at the provider's discretion.      For prescription refill requests, have your pharmacy contact our office and allow 72 hours for refills to be completed.    Today you received the following chemotherapy and/or immunotherapy agents Darzalex      To help prevent nausea and vomiting after your treatment, we encourage you to take your nausea medication as directed.  BELOW ARE SYMPTOMS THAT SHOULD BE REPORTED IMMEDIATELY: *FEVER GREATER THAN 100.4 F (38 C) OR HIGHER *CHILLS OR SWEATING *NAUSEA AND VOMITING THAT IS NOT CONTROLLED WITH YOUR NAUSEA MEDICATION *UNUSUAL SHORTNESS OF BREATH *UNUSUAL BRUISING OR BLEEDING *URINARY PROBLEMS (pain or burning when urinating, or frequent urination) *BOWEL PROBLEMS (unusual diarrhea, constipation, pain near the anus) TENDERNESS IN MOUTH AND THROAT WITH OR WITHOUT PRESENCE OF ULCERS (sore throat, sores in mouth, or a toothache) UNUSUAL RASH, SWELLING OR PAIN  UNUSUAL VAGINAL DISCHARGE OR ITCHING   Items with * indicate a potential emergency and should be followed up as soon as possible or go to the Emergency Department if any problems should occur.  Please show the CHEMOTHERAPY ALERT CARD or IMMUNOTHERAPY ALERT CARD at check-in to  the Emergency Department and triage nurse.  Should you have questions after your visit or need to cancel or reschedule your appointment, please contact [facility]  [phone] and follow the prompts.  Office hours are 8:00 a.m. to 4:30 p.m. Monday - Friday. Please note that voicemails left after 4:00 p.m. may not be returned until the following business day.  We are closed weekends and major holidays. You have access to a nurse at all times for urgent questions. Please call the main number to the clinic [phone] and follow the prompts.  For any non-urgent questions, you may also contact your provider using MyChart. We now offer e-Visits for anyone 18 and older to request care online for non-urgent symptoms. For details visit [email or web address].   Also download the MyChart app! Go to the app store, search "MyChart", open the app, select [facility], and log in with your MyChart username and password.  Masks are optional in the cancer centers. If you would like for your care team to wear a mask while they are taking care of you, please let them know. For doctor visits, patients may have with them one support person who is at least 85 years old. At this time, visitors are not allowed in the infusion area.   

## 2021-10-03 NOTE — Progress Notes (Signed)
What foods can she eat to help gain weight and not make sugar go up? Lost 4lbs since last visit.

## 2021-10-03 NOTE — Assessment & Plan Note (Addendum)
#  STAGE I- MULTIPLE MYELOMA -Active [bone lesions; hypercalcemia; anemia; No renal insuffiencey;Bone marrow-32% plasma cell-   STANDARD Cytogenetics].  Currently on Revlimid-Dex- Dara SQ. Partial response noted; JUNE  2023-M protein = 0.3 gm/fl; K/L= 1. 4.  STABLE.  #Proceed with Dara SQ today; Labs today reviewed;  acceptable for treatment today. On Revlimid at 10 mg dose 3 weeks on 1 week off. Keep dex 8 mg weekly.    # Diarrhea- improved on Revlimid 10 mg a day- 3 weeks-ON & 1 week OFF.  Stable  # Diabetes-on oral hypoglycemic agents; on long acting insulin; 14 units of lantus on T/W/Thursday- Defer to PCP.PBF- BG-122  Again recommend inform PCP regarding blood sugars log. STABLE.   # HTN: RE-START losartan; continue coreg; check BP at home; bring log to next visit.-   #Hypercalcemia secondary to multiple myeloma- [FEB 2023-Zometa ; calcitonin]-calcium today is 9.26. Zometa q 4W;  proceed with Zometa today.  # Weight loss- awaiting re-veluation with Joli today.   #DVT/shingles prophylaxis: Aspirin/acyclovir. STABLE  # IV access:PIV   [name]- [phone]  zometa-q 4w; MM; K/L q 4 W  # DISPOSITION:  # proceed with  dara SQ;  # follow up in 2 weeks [ok to double book]- MD; labs- cbc/cmp Dara SQ; Zometa--;Dr.B

## 2021-10-03 NOTE — Progress Notes (Signed)
Nutrition Follow-up:   Patient with multiple myeloma, currently on chemotherapy.  Revlimid, Dex and Dara.    Met with patient today during infusion.  Reports that her appetite has been good.  Says that her husband thinks she has been eating less.  Reports that she has been concerned worried about her son-in-law.  Son-in-law has been at [facility] in ICU for several weeks but planning to come home tomorrow.  Daughter has not been able to help patient as much as before.  Patient has been concerned about daughter as well.      Medications: reviewed  Labs: reviewed  Anthropometrics:   Weight 124 lb 12.8 oz today decreased  128 lb 14.4 oz on 7/6 124 lb 11.2 oz on 6/16 5/4 118 lb 4.8 oz   NUTRITION DIAGNOSIS: Inadequate oral intake with recent concern, worry about daughter and son-in-law   INTERVENTION:  Discussed ways to add calories to diet without adding sugar.   Encouraged patient to read foods labels and pick the food highest in calories  Encouraged patient to eat q 2 hours Continue oral nutrition supplements     MONITORING, EVALUATION, GOAL: weight trends, intake   NEXT VISIT: Thursday, August 3rd during infusion  Marshawn Ninneman B. [name], [place], [place] Registered Dietitian [phone]

## 2021-10-03 NOTE — Progress Notes (Signed)
[place] NOTE  Patient Care Team: Juline Patch, MD as PCP - General (Family Medicine) Vladimir Faster, [facility] (Inactive) (Pharmacist) Cammie Sickle, MD as Consulting Physician (Oncology)  CHIEF COMPLAINTS/PURPOSE OF CONSULTATION: Multiple myeloma  Oncology History Overview Note  # MULTIPLE MYELOMA  [FEB 2023-hypercalcemia status changes]-Active [bone lesions; hypercalcemia; anemia; No renal insuffiencey;Bone marrow-32% plasma cell-   STANDARD Cytogenetics].FEB 2023- S/p dexamethasone 20 mg q day x4.   # REV [15 mg -3 week-On and 1 week-OFF]-DARA; May 2023-discontinued rev 15 diarrhea/hypotension  # MAY 2023- dara-Rev 10 mg 3w/1w   # FEB 2023-hypercalcemia [[facility]-status post calcitonin; bisphosphonate]  BONE MARROW, ASPIRATE, CLOT, CORE:  -Hypercellular bone marrow with plasma cell neoplasm  -See comment   PERIPHERAL BLOOD:  -Normocytic-normochromic anemia   COMMENT:   The bone marrow is hypercellular for age with increased number of  atypical plasma cells representing 32% of all cells in the aspirate  associated with interstitial infiltrates and numerous variably sized  clusters in the clot/biopsy sections.  The plasma cells display weak  kappa light chain restriction consistent with plasma cell neoplasm.  Correlation with cytogenetic and FISH studies is recommended   MICROSCOPIC DESCRIPTION:   PERIPHERAL BLOOD SMEAR: The red blood cells display mild  anisopoikilocytosis with mild polychromasia.  The white blood cells are  normal number with scattered hypogranular neutrophils. An occasional  myelocyte and large atypical mononuclear cells are seen on scan. The  platelets are normal in number.    Multiple myeloma not having achieved remission ([place])  05/15/2021 Initial Diagnosis   Multiple myeloma not having achieved remission ([place])   06/27/2021 -  Chemotherapy   Patient is on Treatment Plan : MYELOMA Daratumumab SQ + Lenalidomide +  Dexamethasone (DaraRd) q28d     07/11/2021 Cancer Staging   Staging form: Plasma Cell Myeloma and Plasma Cell Disorders, AJCC 8th Edition - Clinical: Beta-2-microglobulin (mg/L): 2.6, Albumin (g/dL): 3.9, ISS: Stage I - Signed by Cammie Sickle, MD on 07/11/2021 Stage prefix: Initial diagnosis Beta 2 microglobulin range (mg/L): Less than 3.5 Albumin range (g/dL): Greater than or equal to 3.5     HISTORY OF PRESENTING ILLNESS: pt in a wheel chair; accompanied by her daughter.   Cristina Morgan 87 y.o.  female with  multiple myeloma currently on Rev-Dex is proceed with Dara SQ today.  Patient continues to be on 10 mg of Revlimid 3 weeks on 1 week off.  However she did not get started last visit because of logistical issues.   Blood sugars are better controlled.  States blood sugars are slightly lower 80s to 90s fasting.  Patient unfortunately has lost weight.  Patient states her back pain is improved.  Denies any worsening joint pains.  No falls.  No further episodes of mental status changes.    Review of Systems  Constitutional:  Positive for malaise/fatigue and weight loss. Negative for chills, diaphoresis and fever.  HENT:  Negative for nosebleeds and sore throat.   Eyes:  Negative for double vision.  Respiratory:  Negative for cough, hemoptysis, sputum production, shortness of breath and wheezing.   Cardiovascular:  Negative for chest pain, palpitations, orthopnea and leg swelling.  Gastrointestinal:  Negative for abdominal pain, blood in stool, constipation, diarrhea, heartburn, melena, nausea and vomiting.  Genitourinary:  Negative for dysuria, frequency and urgency.  Musculoskeletal:  Positive for back pain and joint pain.  Skin: Negative.  Negative for itching and rash.  Neurological:  Negative for dizziness, tingling, focal weakness,  weakness and headaches.  Endo/Heme/Allergies:  Does not bruise/bleed easily.  Psychiatric/Behavioral:  Negative for depression. The patient  is not nervous/anxious and does not have insomnia.      MEDICAL HISTORY:  Past Medical History:  Diagnosis Date   Anemia    CAD (coronary artery disease)    Cataracts, bilateral    Closed compression fracture of first lumbar vertebra (HCC)    Closed compression fracture of second lumbar vertebra (HCC)    Diabetes mellitus without complication (HCC)    High cholesterol    History of pneumonia 2010   [place]   History of uterine fibroid    Hypercalcemia    Hyperlipidemia    Hypertension    Lytic bone lesions on xray    Multiple myeloma (HCC)    Osteoporosis    Primary osteoarthritis of right knee     SURGICAL HISTORY: Past Surgical History:  Procedure Laterality Date   CATARACT EXTRACTION Bilateral 2014   COLONOSCOPY  2011   cleared for 5 yrs- [facility]   ECTOPIC PREGNANCY SURGERY      SOCIAL HISTORY: Social History   Socioeconomic History   Marital status: Married    Spouse name: Not on file   Number of children: 2   Years of education: Not on file   Highest education level: 12th grade  Occupational History   Occupation: Retired  Tobacco Use   Smoking status: Never    Passive exposure: Never   Smokeless tobacco: Never   Tobacco comments:    smoking cessation materials not required  Vaping Use   Vaping Use: Never used  Substance and Sexual Activity   Alcohol use: No   Drug use: No   Sexual activity: Not Currently  Other Topics Concern   Not on file  Social History Narrative   [place] with husband; drives. Never smoked; no alcohol. Daughters live close.    Social Determinants of Health   Financial Resource Strain: Low Risk  (06/12/2021)   Overall Financial Resource Strain (CARDIA)    Difficulty of Paying Living Expenses: Not very hard  Food Insecurity: No Food Insecurity (06/12/2021)   Hunger Vital Sign    Worried About Running Out of Food in the Last Year: Never true    Ran Out of Food in the Last Year: Never true  Transportation Needs: No  Transportation Needs (06/12/2021)   PRAPARE - Hydrologist (Medical): No    Lack of Transportation (Non-Medical): No  Physical Activity: Inactive (06/12/2021)   Exercise Vital Sign    Days of Exercise per Week: 0 days    Minutes of Exercise per Session: 0 min  Stress: No Stress Concern Present (06/12/2021)   [place]    Feeling of Stress : Only a little  Social Connections: Moderately Integrated (06/12/2021)   Social Connection and Isolation Panel [NHANES]    Frequency of Communication with Friends and Family: Three times a week    Frequency of Social Gatherings with Friends and Family: Three times a week    Attends Religious Services: 1 to 4 times per year    Active Member of Clubs or Organizations: No    Attends Archivist Meetings: Never    Marital Status: Married  Human resources officer Violence: Not At Risk (06/12/2021)   Humiliation, Afraid, Rape, and Kick questionnaire    Fear of Current or Ex-Partner: No    Emotionally Abused: No    Physically Abused:  No    Sexually Abused: No    FAMILY HISTORY: Family History  Problem Relation Age of Onset   Cancer Mother        breast   Heart disease Mother    Diabetes Father    Heart disease Father    Heart attack Father    Heart attack Brother    Diabetes Brother     ALLERGIES:  is allergic to latex and penicillins.  MEDICATIONS:  Current Outpatient Medications  Medication Sig Dispense Refill   ACCU-CHEK GUIDE test strip USE 1 STRIP TO CHECK GLUCOSE ONCE DAILY AS DIRECTED     Accu-Chek Softclix Lancets lancets USE 1 TO CHECK GLUCOSE ONCE DAILY     acetaminophen (TYLENOL) 325 MG tablet Take 325 mg by mouth 2 (two) times daily. START 2 days prior to chemo infusion. Take For 2 days; Do NOT take on the day of infusion     acyclovir (ZOVIRAX) 400 MG tablet TAKE ONE TABLET BY MOUTH TWICE DAILY 60 tablet 4   BD PEN NEEDLE NANO 2ND GEN  32G X 4 MM MISC USE 1 ONCE DAILY     Blood Glucose Monitoring Suppl (ACCU-CHEK GUIDE) w/Device KIT USE AS DIRECTED TO CHECK GLUCOSE     Calcium Carbonate-Vit D-Min (CALCIUM 1200 PO) Take 1 capsule by mouth daily.     carvedilol (COREG) 3.125 MG tablet Take 1 tablet (3.125 mg total) by mouth 2 (two) times daily with a meal. 60 tablet 0   cholecalciferol (VITAMIN D3) 25 MCG (1000 UNIT) tablet Take 1,000 Units by mouth daily.     dexamethasone (DECADRON) 4 MG tablet Take 1 tablet (4 mg total) by mouth 2 (two) times daily. Start 2 days prior to infusion; Take it for 2 days. 60 tablet 3   diphenoxylate-atropine (LOMOTIL) 2.5-0.025 MG tablet Take 1 tablet by mouth 4 (four) times daily as needed for diarrhea or loose stools. Take it along with immodium 60 tablet 0   Glucerna (GLUCERNA) LIQD Take 237 mLs by mouth.     hydrOXYzine (ATARAX) 10 MG tablet Take 1 tablet (10 mg total) by mouth at bedtime as needed. 90 tablet 1   insulin glargine (LANTUS SOLOSTAR) 100 UNIT/ML Solostar Pen Inject 14 Units into the skin daily. 12 units every day except Wed and Thursday- 14 units 3 mL 0   lenalidomide (REVLIMID) 10 MG capsule Take 1 capsule (10 mg total) by mouth daily. Take for 21 days, then hold for 7 days. Repeat every 28 days. 21 capsule 3   losartan (COZAAR) 25 MG tablet TAKE ONE TABLET BY MOUTH EVERY EVENING 90 tablet 0   lovastatin (MEVACOR) 40 MG tablet Take 1 tablet (40 mg total) by mouth every evening. 90 tablet 1   metFORMIN (GLUCOPHAGE) 1000 MG tablet TAKE ONE TABLET BY MOUTH TWICE DAILY 180 tablet 0   montelukast (SINGULAIR) 10 MG tablet Take ONE tablet by MOUTH daily. START TWO DAYS prior TO chemo infusion. Take FOR TWO DAYS; DO NOT take ON THE DAY of infusion. 20 tablet 1   ondansetron (ZOFRAN) 4 MG tablet Take 1 tablet (4 mg total) by mouth every 6 (six) hours as needed for nausea. 30 tablet 1   pantoprazole (PROTONIX) 40 MG tablet Take 1 tablet (40 mg total) by mouth daily. 90 tablet 1   sertraline  (ZOLOFT) 25 MG tablet Take 1 tablet (25 mg total) by mouth daily. 90 tablet 1   No current facility-administered medications for this visit.   Facility-Administered Medications Ordered  in Other Visits  Medication Dose Route Frequency Provider Last Rate Last Admin   daratumumab-hyaluronidase-fihj (DARZALEX FASPRO) 1800-30000 MG-UT/15ML chemo SQ injection 1,800 mg  1,800 mg Subcutaneous Once Cammie Sickle, MD          .  PHYSICAL EXAMINATION:  Vitals:   10/03/21 0856  BP: (!) 151/68  Pulse: 75  Temp: (!) 97.5 F (36.4 C)  SpO2: 100%   Filed Weights   10/03/21 0856  Weight: 124 lb 12.8 oz (56.6 kg)    Physical Exam Vitals and nursing note reviewed.  HENT:     Head: Normocephalic and atraumatic.     Mouth/Throat:     Pharynx: Oropharynx is clear.  Eyes:     Extraocular Movements: Extraocular movements intact.     Pupils: Pupils are equal, round, and reactive to light.  Cardiovascular:     Rate and Rhythm: Normal rate and regular rhythm.  Pulmonary:     Comments: Decreased breath sounds bilaterally.  Abdominal:     Palpations: Abdomen is soft.  Musculoskeletal:        General: Normal range of motion.     Cervical back: Normal range of motion.  Skin:    General: Skin is warm.  Neurological:     General: No focal deficit present.     Mental Status: She is alert and oriented to person, place, and time.  Psychiatric:        Behavior: Behavior normal.        Judgment: Judgment normal.      LABORATORY DATA:  I have reviewed the data as listed Lab Results  Component Value Date   WBC 9.6 10/03/2021   HGB 10.0 (L) 10/03/2021   HCT 30.4 (L) 10/03/2021   MCV 98.1 10/03/2021   PLT 383 10/03/2021   Recent Labs    02/01/21 1502 02/18/21 1615 04/15/21 1549 05/01/21 0320 09/05/21 0859 09/19/21 0837 10/03/21 0854  NA 138  --   --    < > 137 133* 134*  K 4.3  --   --    < > 3.5 4.0 3.9  CL 101  --   --    < > 103 102 104  CO2 23  --   --    < > '24 23 24   ' GLUCOSE 178*  --   --    < > 106* 199* 122*  BUN 19  --   --    < > 18 29* 28*  CREATININE 0.84  --   --    < > 0.98 1.05* 0.94  CALCIUM 10.7*   < >  --    < > 9.1 9.8 9.7  GFRNONAA  --   --   --    < > 57* 52* 59*  PROT  --   --   --    < > 7.0 6.6 6.8  ALBUMIN 4.3  --  4.4   < > 4.1 3.9 4.2  AST 16  --  17   < > '22 21 26  ' ALT 9  --  8   < > '18 14 15  ' ALKPHOS 102  --  108   < > 90 81 72  BILITOT 0.3  --  0.3   < > 0.4 0.5 0.6  BILIDIR 0.12  --  0.10  --   --   --   --    < > = values in this interval not displayed.    RADIOGRAPHIC STUDIES:  I have personally reviewed the radiological images as listed and agreed with the findings in the report. No results found.  Multiple myeloma not having achieved remission ([place]) # STAGE I- MULTIPLE MYELOMA -Active [bone lesions; hypercalcemia; anemia; No renal insuffiencey;Bone marrow-32% plasma cell-   STANDARD Cytogenetics].  Currently on Revlimid-Dex- Dara SQ. Partial response noted; JUNE  2023-M protein = 0.3 gm/fl; K/L= 1. 4.  STABLE.  #Proceed with Dara SQ today; Labs today reviewed;  acceptable for treatment today. On Revlimid at 10 mg dose 3 weeks on 1 week off. Keep dex 8 mg weekly.    # Diarrhea- improved on Revlimid 10 mg a day- 3 weeks-ON & 1 week OFF.  Stable  # Diabetes-on oral hypoglycemic agents; on long acting insulin; 14 units of lantus on T/W/Thursday- Defer to PCP.PBF- BG-122  Again recommend inform PCP regarding blood sugars log. STABLE.   # HTN: RE-START losartan; continue coreg; check BP at home; bring log to next visit.-   #Hypercalcemia secondary to multiple myeloma- [FEB 2023-Zometa ; calcitonin]-calcium today is 9.26. Zometa q 4W;  proceed with Zometa today.  # Weight loss- awaiting re-veluation with Joli today.   #DVT/shingles prophylaxis: Aspirin/acyclovir. STABLE  # IV access:PIV   [name]- [phone]  zometa-q 4w; MM; K/L q 4 W  # DISPOSITION:  # proceed with  dara SQ;  # follow up in 2 weeks [ok to  double book]- MD; labs- cbc/cmp Dara SQ; Zometa--;Dr.B      All questions were answered. The patient knows to call the clinic with any problems, questions or concerns.       Cammie Sickle, MD 10/03/2021 10:08 AM

## 2021-10-04 ENCOUNTER — Other Ambulatory Visit: Payer: Self-pay | Admitting: Family Medicine

## 2021-10-04 LAB — KAPPA/LAMBDA LIGHT CHAINS
Kappa free light chain: 11.6 mg/L (ref 3.3–19.4)
Kappa, lambda light chain ratio: 1.97 — ABNORMAL HIGH (ref 0.26–1.65)
Lambda free light chains: 5.9 mg/L (ref 5.7–26.3)

## 2021-10-05 ENCOUNTER — Other Ambulatory Visit: Payer: Self-pay | Admitting: Internal Medicine

## 2021-10-05 DIAGNOSIS — C9000 Multiple myeloma not having achieved remission: Secondary | ICD-10-CM

## 2021-10-07 ENCOUNTER — Other Ambulatory Visit: Payer: Self-pay

## 2021-10-07 ENCOUNTER — Encounter: Payer: Self-pay | Admitting: Internal Medicine

## 2021-10-07 NOTE — Telephone Encounter (Signed)
Requested medication (s) are due for refill today: yes  Requested medication (s) are on the active medication list:yes  Last refill:  07/01/21  Future visit scheduled: no  Notes to clinic:  Unable to refill per protocol, a historical medication, by historical provider, routing for approval.      Requested Prescriptions  Pending Prescriptions Disp Refills   Accu-Chek Softclix Lancets lancets [Pharmacy Med Name: [place] 100 each 0    Sig: USE 1 TO [place] DAILY     Endocrinology: Diabetes - Testing Supplies Passed - 10/04/2021  6:00 PM      Passed - Valid encounter within last 12 months    Recent Outpatient Visits           1 month ago Type 2 diabetes mellitus with other specified complication, with long-term current use of insulin ([place])   [facility] Juline Patch, MD   2 months ago Type 2 diabetes mellitus without complication, without long-term current use of insulin ([place])   [facility] Juline Patch, MD   3 months ago Type 2 diabetes mellitus without complication, without long-term current use of insulin ([place])   [facility] Juline Patch, MD   4 months ago Type 2 diabetes mellitus without complication, without long-term current use of insulin ([place])   [facility] Juline Patch, MD   4 months ago Type 2 diabetes mellitus without complication, without long-term current use of insulin ([place])   [facility] Juline Patch, MD       Future Appointments             In 2 months Juline Patch, MD [facility] [place] test strip [place] Med Name: Accu-Chek Guide In Vitro Strip] 100 each 0    Sig: USE 1 STRIP TO [place] AS DIRECTED     Endocrinology: Diabetes - Testing Supplies Passed - 10/04/2021  6:00 PM      Passed - Valid encounter within last 12 months    Recent Outpatient Visits           1 month ago Type 2 diabetes mellitus with  other specified complication, with long-term current use of insulin ([place])   [facility] Juline Patch, MD   2 months ago Type 2 diabetes mellitus without complication, without long-term current use of insulin ([place])   [facility] Juline Patch, MD   3 months ago Type 2 diabetes mellitus without complication, without long-term current use of insulin ([place])   [facility] Juline Patch, MD   4 months ago Type 2 diabetes mellitus without complication, without long-term current use of insulin ([place])   [facility] Juline Patch, MD   4 months ago Type 2 diabetes mellitus without complication, without long-term current use of insulin ([place])   [facility] Juline Patch, MD       Future Appointments             In 2 months Juline Patch, MD [facility]

## 2021-10-09 LAB — MULTIPLE MYELOMA PANEL, SERUM
Albumin SerPl Elph-Mcnc: 3.7 g/dL (ref 2.9–4.4)
Albumin/Glob SerPl: 1.4 (ref 0.7–1.7)
Alpha 1: 0.3 g/dL (ref 0.0–0.4)
Alpha2 Glob SerPl Elph-Mcnc: 0.8 g/dL (ref 0.4–1.0)
B-Globulin SerPl Elph-Mcnc: 1 g/dL (ref 0.7–1.3)
Gamma Glob SerPl Elph-Mcnc: 0.6 g/dL (ref 0.4–1.8)
Globulin, Total: 2.7 g/dL (ref 2.2–3.9)
IgA: 61 mg/dL — ABNORMAL LOW (ref 64–422)
IgG (Immunoglobin G), Serum: 698 mg/dL (ref 586–1602)
IgM (Immunoglobulin M), Srm: 27 mg/dL (ref 26–217)
M Protein SerPl Elph-Mcnc: 0.3 g/dL — ABNORMAL HIGH
Total Protein ELP: 6.4 g/dL (ref 6.0–8.5)

## 2021-10-15 ENCOUNTER — Other Ambulatory Visit: Payer: Self-pay

## 2021-10-17 ENCOUNTER — Inpatient Hospital Stay: Payer: Medicare Other

## 2021-10-17 ENCOUNTER — Inpatient Hospital Stay (HOSPITAL_BASED_OUTPATIENT_CLINIC_OR_DEPARTMENT_OTHER): Payer: Medicare Other | Admitting: Internal Medicine

## 2021-10-17 ENCOUNTER — Inpatient Hospital Stay: Payer: Medicare Other | Attending: Nurse Practitioner

## 2021-10-17 ENCOUNTER — Encounter: Payer: Self-pay | Admitting: Internal Medicine

## 2021-10-17 DIAGNOSIS — R197 Diarrhea, unspecified: Secondary | ICD-10-CM | POA: Insufficient documentation

## 2021-10-17 DIAGNOSIS — Z7961 Long term (current) use of immunomodulator: Secondary | ICD-10-CM | POA: Insufficient documentation

## 2021-10-17 DIAGNOSIS — Z5112 Encounter for antineoplastic immunotherapy: Secondary | ICD-10-CM | POA: Diagnosis not present

## 2021-10-17 DIAGNOSIS — C9000 Multiple myeloma not having achieved remission: Secondary | ICD-10-CM

## 2021-10-17 DIAGNOSIS — E119 Type 2 diabetes mellitus without complications: Secondary | ICD-10-CM | POA: Insufficient documentation

## 2021-10-17 DIAGNOSIS — I10 Essential (primary) hypertension: Secondary | ICD-10-CM | POA: Insufficient documentation

## 2021-10-17 DIAGNOSIS — D649 Anemia, unspecified: Secondary | ICD-10-CM | POA: Diagnosis not present

## 2021-10-17 LAB — CBC WITH DIFFERENTIAL/PLATELET
Abs Immature Granulocytes: 0.03 10*3/uL (ref 0.00–0.07)
Basophils Absolute: 0 10*3/uL (ref 0.0–0.1)
Basophils Relative: 0 %
Eosinophils Absolute: 0 10*3/uL (ref 0.0–0.5)
Eosinophils Relative: 0 %
HCT: 27.8 % — ABNORMAL LOW (ref 36.0–46.0)
Hemoglobin: 9 g/dL — ABNORMAL LOW (ref 12.0–15.0)
Immature Granulocytes: 1 %
Lymphocytes Relative: 22 %
Lymphs Abs: 1.4 10*3/uL (ref 0.7–4.0)
MCH: 32.5 pg (ref 26.0–34.0)
MCHC: 32.4 g/dL (ref 30.0–36.0)
MCV: 100.4 fL — ABNORMAL HIGH (ref 80.0–100.0)
Monocytes Absolute: 0.7 10*3/uL (ref 0.1–1.0)
Monocytes Relative: 11 %
Neutro Abs: 4.2 10*3/uL (ref 1.7–7.7)
Neutrophils Relative %: 66 %
Platelets: 220 10*3/uL (ref 150–400)
RBC: 2.77 MIL/uL — ABNORMAL LOW (ref 3.87–5.11)
RDW: 15.3 % (ref 11.5–15.5)
WBC: 6.4 10*3/uL (ref 4.0–10.5)
nRBC: 0 % (ref 0.0–0.2)

## 2021-10-17 LAB — COMPREHENSIVE METABOLIC PANEL
ALT: 17 U/L (ref 0–44)
AST: 24 U/L (ref 15–41)
Albumin: 3.8 g/dL (ref 3.5–5.0)
Alkaline Phosphatase: 74 U/L (ref 38–126)
Anion gap: 7 (ref 5–15)
BUN: 29 mg/dL — ABNORMAL HIGH (ref 8–23)
CO2: 22 mmol/L (ref 22–32)
Calcium: 9 mg/dL (ref 8.9–10.3)
Chloride: 104 mmol/L (ref 98–111)
Creatinine, Ser: 0.99 mg/dL (ref 0.44–1.00)
GFR, Estimated: 56 mL/min — ABNORMAL LOW (ref >60)
Glucose, Bld: 223 mg/dL — ABNORMAL HIGH (ref 70–99)
Potassium: 3.8 mmol/L (ref 3.5–5.1)
Sodium: 133 mmol/L — ABNORMAL LOW (ref 135–145)
Total Bilirubin: 0.4 mg/dL (ref 0.3–1.2)
Total Protein: 6.5 g/dL (ref 6.5–8.1)

## 2021-10-17 MED ORDER — DIPHENHYDRAMINE HCL 25 MG PO CAPS
50.0000 mg | ORAL_CAPSULE | Freq: Once | ORAL | Status: AC
  Administered 2021-10-17: 50 mg via ORAL
  Filled 2021-10-17: qty 2

## 2021-10-17 MED ORDER — ACETAMINOPHEN 325 MG PO TABS
650.0000 mg | ORAL_TABLET | Freq: Once | ORAL | Status: AC
  Administered 2021-10-17: 650 mg via ORAL
  Filled 2021-10-17: qty 2

## 2021-10-17 MED ORDER — SODIUM CHLORIDE 0.9 % IV SOLN
Freq: Once | INTRAVENOUS | Status: AC
  Filled 2021-10-17: qty 250

## 2021-10-17 MED ORDER — DARATUMUMAB-HYALURONIDASE-FIHJ 1800-30000 MG-UT/15ML ~~LOC~~ SOLN
1800.0000 mg | Freq: Once | SUBCUTANEOUS | Status: AC
  Administered 2021-10-17: 1800 mg via SUBCUTANEOUS
  Filled 2021-10-17: qty 15

## 2021-10-17 MED ORDER — DEXAMETHASONE 4 MG PO TABS
20.0000 mg | ORAL_TABLET | Freq: Once | ORAL | Status: AC
  Administered 2021-10-17: 20 mg via ORAL
  Filled 2021-10-17: qty 5

## 2021-10-17 MED ORDER — ZOLEDRONIC ACID 4 MG/5ML IV CONC
3.0000 mg | Freq: Once | INTRAVENOUS | Status: AC
  Administered 2021-10-17: 3 mg via INTRAVENOUS
  Filled 2021-10-17: qty 3.75

## 2021-10-17 NOTE — Assessment & Plan Note (Addendum)
#  STAGE I- MULTIPLE MYELOMA -Active [bone lesions; hypercalcemia; anemia; No renal insuffiencey;Bone marrow-32% plasma cell-   STANDARD Cytogenetics].  Currently on Revlimid-Dex- Dara SQ. Partial response noted; JULY 2023-M protein = 0.3 gm/fl; K/L= 1. 9.   STABLE.  # Proceed with Dara SQ today; Labs today reviewed;  acceptable for treatment today. On Revlimid at 10 mg dose 3 weeks on 1 week off. Keep dex 8 mg weekly.    # Diarrhea- improved on Revlimid 10 mg a day- 3 weeks-ON & 1 week OFF.  Stable  # Diabetes-on oral hypoglycemic agents; on long acting insulin; 14 units of lantus on T/W/Thursday- Defer to PCP.PBF- BG-122  Again recommend inform PCP regarding blood sugars log. STABLE.  # HTN: continue losartan; continue coreg; BP- 130s. STABLE  #Hypercalcemia secondary to multiple myeloma- [FEB 2023-Zometa ; calcitonin]-calcium today is 9.6. Zometa q 4W- .  # Weight loss-s/p re-veluation with Joli -STABLE  #DVT/shingles prophylaxis: Aspirin/acyclovir. STABLE  # IV access:PIV   [name]- [phone]  zometa-q 4w; MM; K/L q 4 W  # DISPOSITION:   # proceed with  dara SQ; Zometa-  # follow up in 2 weeks/- MD; labs- cbc/cmp; MM anel; K/l light chains Dara SQ; -;Dr.B

## 2021-10-17 NOTE — Progress Notes (Signed)
Nutrition Follow-up:  Patient with multiple myeloma, currently on chemotherapy.    Met with patient today during infusion.  She says that she has been snacking more.  She has been drinking 2-3 glucerna shakes a day.  "I thought I would have gained more weight."      Medications: reviewed  Labs: reviewed  Anthropometrics:   Weight 125 lb 3.2 oz  124 lb 12.8 oz today on 7/20 128 lb 14.4 oz on 7/6 124 lb 11.2 oz on 6/16 118 lb 4.8 oz on 5/4   NUTRITION DIAGNOSIS: Inadequate oral intake improving    INTERVENTION:  Congratulated patient on weight gain.  Patient wanted to discuss options for breakfast.  RD provided some options Continue glucerna shakes and eating q 2 hours    MONITORING, EVALUATION, GOAL: weight trends, intake   NEXT VISIT: ~ 4 weeks during infusion  Joli B. Allen, RD, LDN Registered Dietitian [phone]   

## 2021-10-17 NOTE — Progress Notes (Signed)
[facility] NOTE  Patient Care Team: Juline Patch, MD as PCP - General (Family Medicine) Vladimir Faster, [facility] (Inactive) (Pharmacist) Cammie Sickle, MD as Consulting Physician (Oncology)  CHIEF COMPLAINTS/PURPOSE OF CONSULTATION: Multiple myeloma  Oncology History Overview Note  # MULTIPLE MYELOMA  [FEB 2023-hypercalcemia status changes]-Active [bone lesions; hypercalcemia; anemia; No renal insuffiencey;Bone marrow-32% plasma cell-   STANDARD Cytogenetics].FEB 2023- S/p dexamethasone 20 mg q day x4.   # REV [15 mg -3 week-On and 1 week-OFF]-DARA; May 2023-discontinued rev 15 diarrhea/hypotension  # MAY 2023- dara-Rev 10 mg 3w/1w   # FEB 2023-hypercalcemia [[facility]-status post calcitonin; bisphosphonate]  BONE MARROW, ASPIRATE, CLOT, CORE:  -Hypercellular bone marrow with plasma cell neoplasm  -See comment   PERIPHERAL BLOOD:  -Normocytic-normochromic anemia   COMMENT:   The bone marrow is hypercellular for age with increased number of  atypical plasma cells representing 32% of all cells in the aspirate  associated with interstitial infiltrates and numerous variably sized  clusters in the clot/biopsy sections.  The plasma cells display weak  kappa light chain restriction consistent with plasma cell neoplasm.  Correlation with cytogenetic and FISH studies is recommended   MICROSCOPIC DESCRIPTION:   PERIPHERAL BLOOD SMEAR: The red blood cells display mild  anisopoikilocytosis with mild polychromasia.  The white blood cells are  normal number with scattered hypogranular neutrophils. An occasional  myelocyte and large atypical mononuclear cells are seen on scan. The  platelets are normal in number.    Multiple myeloma not having achieved remission ([place])  05/15/2021 Initial Diagnosis   Multiple myeloma not having achieved remission ([place])   06/27/2021 -  Chemotherapy   Patient is on Treatment Plan : MYELOMA Daratumumab SQ + Lenalidomide +  Dexamethasone (DaraRd) q28d     07/11/2021 Cancer Staging   Staging form: Plasma Cell Myeloma and Plasma Cell Disorders, AJCC 8th Edition - Clinical: Beta-2-microglobulin (mg/L): 2.6, Albumin (g/dL): 3.9, ISS: Stage I - Signed by Cammie Sickle, MD on 07/11/2021 Stage prefix: Initial diagnosis Beta 2 microglobulin range (mg/L): Less than 3.5 Albumin range (g/dL): Greater than or equal to 3.5     HISTORY OF PRESENTING ILLNESS: pt in a wheel chair; accompanied by her daughter.   Cristina Morgan 87 y.o.  female with  multiple myeloma currently on Rev-Dex is proceed with Dara SQ today.  Patient continues to be on 10 mg of Revlimid 3 weeks on 1 week off.    Blood pressures are better controlled.  Blood sugars are also well controlled-  States blood sugars are slightly lower 80s to 90s fasting.  She has gained some weight.  Patient states her back pain is improved.  Denies any worsening joint pains.  No falls.  No further episodes of mental status changes.  No diarrhea.   Review of Systems  Constitutional:  Positive for malaise/fatigue and weight loss. Negative for chills, diaphoresis and fever.  HENT:  Negative for nosebleeds and sore throat.   Eyes:  Negative for double vision.  Respiratory:  Negative for cough, hemoptysis, sputum production, shortness of breath and wheezing.   Cardiovascular:  Negative for chest pain, palpitations, orthopnea and leg swelling.  Gastrointestinal:  Negative for abdominal pain, blood in stool, constipation, diarrhea, heartburn, melena, nausea and vomiting.  Genitourinary:  Negative for dysuria, frequency and urgency.  Musculoskeletal:  Positive for back pain and joint pain.  Skin: Negative.  Negative for itching and rash.  Neurological:  Negative for dizziness, tingling, focal weakness, weakness and headaches.  Endo/Heme/Allergies:  Does not bruise/bleed easily.  Psychiatric/Behavioral:  Negative for depression. The patient is not nervous/anxious and  does not have insomnia.      MEDICAL HISTORY:  Past Medical History:  Diagnosis Date   Anemia    CAD (coronary artery disease)    Cataracts, bilateral    Closed compression fracture of first lumbar vertebra (HCC)    Closed compression fracture of second lumbar vertebra (HCC)    Diabetes mellitus without complication (HCC)    High cholesterol    History of pneumonia 2010   [place]   History of uterine fibroid    Hypercalcemia    Hyperlipidemia    Hypertension    Lytic bone lesions on xray    Multiple myeloma (HCC)    Osteoporosis    Primary osteoarthritis of right knee     SURGICAL HISTORY: Past Surgical History:  Procedure Laterality Date   CATARACT EXTRACTION Bilateral 2014   COLONOSCOPY  2011   cleared for 5 yrs- [facility]   ECTOPIC PREGNANCY SURGERY      SOCIAL HISTORY: Social History   Socioeconomic History   Marital status: Married    Spouse name: Not on file   Number of children: 2   Years of education: Not on file   Highest education level: 12th grade  Occupational History   Occupation: Retired  Tobacco Use   Smoking status: Never    Passive exposure: Never   Smokeless tobacco: Never   Tobacco comments:    smoking cessation materials not required  Vaping Use   Vaping Use: Never used  Substance and Sexual Activity   Alcohol use: No   Drug use: No   Sexual activity: Not Currently  Other Topics Concern   Not on file  Social History Narrative   [place] with husband; drives. Never smoked; no alcohol. Daughters live close.    Social Determinants of Health   Financial Resource Strain: Low Risk  (06/12/2021)   Overall Financial Resource Strain (CARDIA)    Difficulty of Paying Living Expenses: Not very hard  Food Insecurity: No Food Insecurity (06/12/2021)   Hunger Vital Sign    Worried About Running Out of Food in the Last Year: Never true    Ran Out of Food in the Last Year: Never true  Transportation Needs: No Transportation Needs  (06/12/2021)   PRAPARE - Hydrologist (Medical): No    Lack of Transportation (Non-Medical): No  Physical Activity: Inactive (06/12/2021)   Exercise Vital Sign    Days of Exercise per Week: 0 days    Minutes of Exercise per Session: 0 min  Stress: No Stress Concern Present (06/12/2021)   [place]    Feeling of Stress : Only a little  Social Connections: Moderately Integrated (06/12/2021)   Social Connection and Isolation Panel [NHANES]    Frequency of Communication with Friends and Family: Three times a week    Frequency of Social Gatherings with Friends and Family: Three times a week    Attends Religious Services: 1 to 4 times per year    Active Member of Clubs or Organizations: No    Attends Archivist Meetings: Never    Marital Status: Married  Human resources officer Violence: Not At Risk (06/12/2021)   Humiliation, Afraid, Rape, and Kick questionnaire    Fear of Current or Ex-Partner: No    Emotionally Abused: No    Physically Abused: No  Sexually Abused: No    FAMILY HISTORY: Family History  Problem Relation Age of Onset   Cancer Mother        breast   Heart disease Mother    Diabetes Father    Heart disease Father    Heart attack Father    Heart attack Brother    Diabetes Brother     ALLERGIES:  is allergic to latex and penicillins.  MEDICATIONS:  Current Outpatient Medications  Medication Sig Dispense Refill   ACCU-CHEK GUIDE test strip USE 1 STRIP TO CHECK GLUCOSE ONCE DAILY AS DIRECTED 100 each 0   Accu-Chek Softclix Lancets lancets USE 1  TO CHECK GLUCOSE ONCE DAILY 100 each 0   acetaminophen (TYLENOL) 325 MG tablet Take 325 mg by mouth 2 (two) times daily. START 2 days prior to chemo infusion. Take For 2 days; Do NOT take on the day of infusion     acyclovir (ZOVIRAX) 400 MG tablet TAKE ONE TABLET BY MOUTH TWICE DAILY 60 tablet 4   BD PEN NEEDLE NANO 2ND GEN 32G  X 4 MM MISC USE 1 ONCE DAILY     Blood Glucose Monitoring Suppl (ACCU-CHEK GUIDE) w/Device KIT USE AS DIRECTED TO CHECK GLUCOSE     Calcium Carbonate-Vit D-Min (CALCIUM 1200 PO) Take 1 capsule by mouth daily.     carvedilol (COREG) 3.125 MG tablet Take 1 tablet (3.125 mg total) by mouth 2 (two) times daily with a meal. 60 tablet 0   cholecalciferol (VITAMIN D3) 25 MCG (1000 UNIT) tablet Take 1,000 Units by mouth daily.     dexamethasone (DECADRON) 4 MG tablet Take 1 tablet (4 mg total) by mouth 2 (two) times daily. Start 2 days prior to infusion; Take it for 2 days. 60 tablet 3   diphenoxylate-atropine (LOMOTIL) 2.5-0.025 MG tablet Take 1 tablet by mouth 4 (four) times daily as needed for diarrhea or loose stools. Take it along with immodium 60 tablet 0   Glucerna (GLUCERNA) LIQD Take 237 mLs by mouth.     hydrOXYzine (ATARAX) 10 MG tablet Take 1 tablet (10 mg total) by mouth at bedtime as needed. 90 tablet 1   insulin glargine (LANTUS SOLOSTAR) 100 UNIT/ML Solostar Pen Inject 14 Units into the skin daily. 12 units every day except Wed and Thursday- 14 units 3 mL 0   lenalidomide (REVLIMID) 10 MG capsule Take 1 capsule (10 mg total) by mouth daily. Take for 21 days, then hold for 7 days. Repeat every 28 days. 21 capsule 3   losartan (COZAAR) 25 MG tablet TAKE ONE TABLET BY MOUTH EVERY EVENING 90 tablet 0   lovastatin (MEVACOR) 40 MG tablet Take 1 tablet (40 mg total) by mouth every evening. 90 tablet 1   metFORMIN (GLUCOPHAGE) 1000 MG tablet TAKE ONE TABLET BY MOUTH TWICE DAILY 180 tablet 0   montelukast (SINGULAIR) 10 MG tablet Take ONE tablet by MOUTH daily. START TWO DAYS prior TO chemo infusion. Take FOR TWO DAYS; DO NOT take ON THE DAY of infusion. 20 tablet 1   ondansetron (ZOFRAN) 4 MG tablet Take 1 tablet (4 mg total) by mouth every 6 (six) hours as needed for nausea. 30 tablet 1   pantoprazole (PROTONIX) 40 MG tablet Take 1 tablet (40 mg total) by mouth daily. 90 tablet 1   sertraline  (ZOLOFT) 25 MG tablet Take 1 tablet (25 mg total) by mouth daily. 90 tablet 1   No current facility-administered medications for this visit.   Facility-Administered Medications Ordered in  Other Visits  Medication Dose Route Frequency Provider Last Rate Last Admin   daratumumab-hyaluronidase-fihj (DARZALEX FASPRO) 1800-30000 MG-UT/15ML chemo SQ injection 1,800 mg  1,800 mg Subcutaneous Once Cammie Sickle, MD          .  PHYSICAL EXAMINATION:  Vitals:   10/17/21 0928  BP: (!) 131/58  Pulse: 79  Temp: (!) 96.7 F (35.9 C)  SpO2: 91%   Filed Weights   10/17/21 0928  Weight: 125 lb 3.2 oz (56.8 kg)    Physical Exam Vitals and nursing note reviewed.  HENT:     Head: Normocephalic and atraumatic.     Mouth/Throat:     Pharynx: Oropharynx is clear.  Eyes:     Extraocular Movements: Extraocular movements intact.     Pupils: Pupils are equal, round, and reactive to light.  Cardiovascular:     Rate and Rhythm: Normal rate and regular rhythm.  Pulmonary:     Comments: Decreased breath sounds bilaterally.  Abdominal:     Palpations: Abdomen is soft.  Musculoskeletal:        General: Normal range of motion.     Cervical back: Normal range of motion.  Skin:    General: Skin is warm.  Neurological:     General: No focal deficit present.     Mental Status: She is alert and oriented to person, place, and time.  Psychiatric:        Behavior: Behavior normal.        Judgment: Judgment normal.      LABORATORY DATA:  I have reviewed the data as listed Lab Results  Component Value Date   WBC 6.4 10/17/2021   HGB 9.0 (L) 10/17/2021   HCT 27.8 (L) 10/17/2021   MCV 100.4 (H) 10/17/2021   PLT 220 10/17/2021   Recent Labs    02/01/21 1502 02/18/21 1615 04/15/21 1549 05/01/21 0320 09/19/21 0837 10/03/21 0854 10/17/21 0855  NA 138  --   --    < > 133* 134* 133*  K 4.3  --   --    < > 4.0 3.9 3.8  CL 101  --   --    < > 102 104 104  CO2 23  --   --    < > '23 24  22  ' GLUCOSE 178*  --   --    < > 199* 122* 223*  BUN 19  --   --    < > 29* 28* 29*  CREATININE 0.84  --   --    < > 1.05* 0.94 0.99  CALCIUM 10.7*   < >  --    < > 9.8 9.7 9.0  GFRNONAA  --   --   --    < > 52* 59* 56*  PROT  --   --   --    < > 6.6 6.8 6.5  ALBUMIN 4.3  --  4.4   < > 3.9 4.2 3.8  AST 16  --  17   < > '21 26 24  ' ALT 9  --  8   < > '14 15 17  ' ALKPHOS 102  --  108   < > 81 72 74  BILITOT 0.3  --  0.3   < > 0.5 0.6 0.4  BILIDIR 0.12  --  0.10  --   --   --   --    < > = values in this interval not displayed.    RADIOGRAPHIC STUDIES:  I have personally reviewed the radiological images as listed and agreed with the findings in the report. No results found.  Multiple myeloma not having achieved remission ([place]) # STAGE I- MULTIPLE MYELOMA -Active [bone lesions; hypercalcemia; anemia; No renal insuffiencey;Bone marrow-32% plasma cell-   STANDARD Cytogenetics].  Currently on Revlimid-Dex- Dara SQ. Partial response noted; JULY 2023-M protein = 0.3 gm/fl; K/L= 1. 9.   STABLE.  # Proceed with Dara SQ today; Labs today reviewed;  acceptable for treatment today. On Revlimid at 10 mg dose 3 weeks on 1 week off. Keep dex 8 mg weekly.    # Diarrhea- improved on Revlimid 10 mg a day- 3 weeks-ON & 1 week OFF.  Stable  # Diabetes-on oral hypoglycemic agents; on long acting insulin; 14 units of lantus on T/W/Thursday- Defer to PCP.PBF- BG-122  Again recommend inform PCP regarding blood sugars log. STABLE.  # HTN: continue losartan; continue coreg; BP- 130s. STABLE  #Hypercalcemia secondary to multiple myeloma- [FEB 2023-Zometa ; calcitonin]-calcium today is 9.6. Zometa q 4W- .  # Weight loss-s/p re-veluation with Joli -STABLE  #DVT/shingles prophylaxis: Aspirin/acyclovir. STABLE  # IV access:PIV   [name]- [phone]  zometa-q 4w; MM; K/L q 4 W  # DISPOSITION:   # proceed with  dara SQ; Zometa-  # follow up in 2 weeks/- MD; labs- cbc/cmp; MM anel; K/l light chains Dara  SQ; -;Dr.B      All questions were answered. The patient knows to call the clinic with any problems, questions or concerns.       Cammie Sickle, MD 10/17/2021 11:28 AM

## 2021-10-25 DIAGNOSIS — H6121 Impacted cerumen, right ear: Secondary | ICD-10-CM | POA: Diagnosis not present

## 2021-10-25 DIAGNOSIS — J302 Other seasonal allergic rhinitis: Secondary | ICD-10-CM | POA: Diagnosis not present

## 2021-10-31 ENCOUNTER — Encounter: Payer: Self-pay | Admitting: Internal Medicine

## 2021-10-31 ENCOUNTER — Inpatient Hospital Stay (HOSPITAL_BASED_OUTPATIENT_CLINIC_OR_DEPARTMENT_OTHER): Payer: Medicare Other | Admitting: Internal Medicine

## 2021-10-31 ENCOUNTER — Inpatient Hospital Stay: Payer: Medicare Other

## 2021-10-31 ENCOUNTER — Other Ambulatory Visit: Payer: Self-pay

## 2021-10-31 VITALS — BP 135/62 | HR 71 | Temp 97.5°F | Resp 16 | Ht 66.0 in | Wt 126.4 lb

## 2021-10-31 DIAGNOSIS — Z5112 Encounter for antineoplastic immunotherapy: Secondary | ICD-10-CM | POA: Diagnosis not present

## 2021-10-31 DIAGNOSIS — R197 Diarrhea, unspecified: Secondary | ICD-10-CM | POA: Diagnosis not present

## 2021-10-31 DIAGNOSIS — C9000 Multiple myeloma not having achieved remission: Secondary | ICD-10-CM

## 2021-10-31 DIAGNOSIS — D649 Anemia, unspecified: Secondary | ICD-10-CM | POA: Diagnosis not present

## 2021-10-31 DIAGNOSIS — E119 Type 2 diabetes mellitus without complications: Secondary | ICD-10-CM | POA: Diagnosis not present

## 2021-10-31 LAB — CBC WITH DIFFERENTIAL/PLATELET
Abs Immature Granulocytes: 0 10*3/uL (ref 0.00–0.07)
Basophils Absolute: 0.1 10*3/uL (ref 0.0–0.1)
Basophils Relative: 1 %
Eosinophils Absolute: 0 10*3/uL (ref 0.0–0.5)
Eosinophils Relative: 0 %
HCT: 29.7 % — ABNORMAL LOW (ref 36.0–46.0)
Hemoglobin: 9.4 g/dL — ABNORMAL LOW (ref 12.0–15.0)
Immature Granulocytes: 0 %
Lymphocytes Relative: 37 %
Lymphs Abs: 1.3 10*3/uL (ref 0.7–4.0)
MCH: 31.9 pg (ref 26.0–34.0)
MCHC: 31.6 g/dL (ref 30.0–36.0)
MCV: 100.7 fL — ABNORMAL HIGH (ref 80.0–100.0)
Monocytes Absolute: 0.6 10*3/uL (ref 0.1–1.0)
Monocytes Relative: 18 %
Neutro Abs: 1.6 10*3/uL — ABNORMAL LOW (ref 1.7–7.7)
Neutrophils Relative %: 44 %
Platelets: 384 10*3/uL (ref 150–400)
RBC: 2.95 MIL/uL — ABNORMAL LOW (ref 3.87–5.11)
RDW: 14.6 % (ref 11.5–15.5)
WBC: 3.6 10*3/uL — ABNORMAL LOW (ref 4.0–10.5)
nRBC: 0 % (ref 0.0–0.2)

## 2021-10-31 LAB — COMPREHENSIVE METABOLIC PANEL
ALT: 16 U/L (ref 0–44)
AST: 24 U/L (ref 15–41)
Albumin: 3.9 g/dL (ref 3.5–5.0)
Alkaline Phosphatase: 63 U/L (ref 38–126)
Anion gap: 7 (ref 5–15)
BUN: 21 mg/dL (ref 8–23)
CO2: 23 mmol/L (ref 22–32)
Calcium: 9.1 mg/dL (ref 8.9–10.3)
Chloride: 106 mmol/L (ref 98–111)
Creatinine, Ser: 1.05 mg/dL — ABNORMAL HIGH (ref 0.44–1.00)
GFR, Estimated: 52 mL/min — ABNORMAL LOW (ref >60)
Glucose, Bld: 184 mg/dL — ABNORMAL HIGH (ref 70–99)
Potassium: 4.1 mmol/L (ref 3.5–5.1)
Sodium: 136 mmol/L (ref 135–145)
Total Bilirubin: 0.6 mg/dL (ref 0.3–1.2)
Total Protein: 6.8 g/dL (ref 6.5–8.1)

## 2021-10-31 MED ORDER — DARATUMUMAB-HYALURONIDASE-FIHJ 1800-30000 MG-UT/15ML ~~LOC~~ SOLN
1800.0000 mg | Freq: Once | SUBCUTANEOUS | Status: AC
  Administered 2021-10-31: 1800 mg via SUBCUTANEOUS
  Filled 2021-10-31: qty 15

## 2021-10-31 MED ORDER — LENALIDOMIDE 10 MG PO CAPS: 10.0000 mg | ORAL_CAPSULE | Freq: Every day | ORAL | 3 refills | Status: DC

## 2021-10-31 MED ORDER — ACETAMINOPHEN 325 MG PO TABS
650.0000 mg | ORAL_TABLET | Freq: Once | ORAL | Status: AC
  Administered 2021-10-31: 650 mg via ORAL
  Filled 2021-10-31: qty 2

## 2021-10-31 MED ORDER — DIPHENHYDRAMINE HCL 25 MG PO CAPS
50.0000 mg | ORAL_CAPSULE | Freq: Once | ORAL | Status: AC
  Administered 2021-10-31: 50 mg via ORAL
  Filled 2021-10-31: qty 2

## 2021-10-31 MED ORDER — DEXAMETHASONE 4 MG PO TABS
20.0000 mg | ORAL_TABLET | Freq: Once | ORAL | Status: AC
  Administered 2021-10-31: 20 mg via ORAL
  Filled 2021-10-31: qty 5

## 2021-10-31 NOTE — Assessment & Plan Note (Addendum)
#  STAGE I- MULTIPLE MYELOMA -Active [bone lesions; hypercalcemia; anemia; No renal insuffiencey;Bone marrow-32% plasma cell-   STANDARD Cytogenetics].  Currently on Revlimid-Dex- Dara SQ. Partial response noted; JULY 2023-M protein = 0.3 gm/fl; K/L= 1. 9.  STABLE.  # Proceed with Dara SQ today; Labs today reviewed;  acceptable for treatment today. On Revlimid at 10 mg dose 3 weeks on 1 week off. Keep dex 8 mg weekly.  STABLE.  # Diarrhea- improved on Revlimid 10 mg a day- 3 weeks-ON & 1 week OFF.  STABLE.  # Diabetes-on oral hypoglycemic agents; on long acting insulin; 14 units of lantus on T/W/Thursday- Defer to PCP.PBF- BG-122  Again recommend inform PCP regarding blood sugars log. STABLE.  # HTN: continue losartan; continue coreg; BP- 130s. STABLE  #Hypercalcemia secondary to multiple myeloma- [FEB 2023-Zometa ; calcitonin]-calcium today is 9.6. Zometa q 4W- .  # Weight loss-s/p re-veluation with Joli -STABLE  #DVT/shingles prophylaxis: Aspirin/acyclovir.STABLE  # IV access:PIV   * [place]- [phone]  zometa-q 4w; MM; K/L q 4 W  # DISPOSITION:  # proceed with  dara SQ; # follow up in 2 weeks/- MD; labs- cbc/cmp;Dara SQ;Zometa -;Dr.B

## 2021-10-31 NOTE — Progress Notes (Signed)
Patient denies new problems/concerns today.   °

## 2021-10-31 NOTE — Progress Notes (Signed)
[place] NOTE  Patient Care Team: Juline Patch, MD as PCP - General (Family Medicine) Vladimir Faster, [facility] (Inactive) (Pharmacist) Cammie Sickle, MD as Consulting Physician (Oncology)  CHIEF COMPLAINTS/PURPOSE OF CONSULTATION: Multiple myeloma  Oncology History Overview Note  # MULTIPLE MYELOMA  [FEB 2023-hypercalcemia status changes]-Active [bone lesions; hypercalcemia; anemia; No renal insuffiencey;Bone marrow-32% plasma cell-   STANDARD Cytogenetics].FEB 2023- S/p dexamethasone 20 mg q day x4.   # REV [15 mg -3 week-On and 1 week-OFF]-DARA; May 2023-discontinued rev 15 diarrhea/hypotension  # MAY 2023- dara-Rev 10 mg 3w/1w   # FEB 2023-hypercalcemia [[facility]-status post calcitonin; bisphosphonate]  BONE MARROW, ASPIRATE, CLOT, CORE:  -Hypercellular bone marrow with plasma cell neoplasm  -See comment   PERIPHERAL BLOOD:  -Normocytic-normochromic anemia   COMMENT:   The bone marrow is hypercellular for age with increased number of  atypical plasma cells representing 32% of all cells in the aspirate  associated with interstitial infiltrates and numerous variably sized  clusters in the clot/biopsy sections.  The plasma cells display weak  kappa light chain restriction consistent with plasma cell neoplasm.  Correlation with cytogenetic and FISH studies is recommended   MICROSCOPIC DESCRIPTION:   PERIPHERAL BLOOD SMEAR: The red blood cells display mild  anisopoikilocytosis with mild polychromasia.  The white blood cells are  normal number with scattered hypogranular neutrophils. An occasional  myelocyte and large atypical mononuclear cells are seen on scan. The  platelets are normal in number.    Multiple myeloma not having achieved remission ([place])  05/15/2021 Initial Diagnosis   Multiple myeloma not having achieved remission ([place])   06/27/2021 - 10/17/2021 Chemotherapy   Patient is on Treatment Plan : MYELOMA Daratumumab SQ + Lenalidomide +  Dexamethasone (DaraRd) q28d     06/27/2021 -  Chemotherapy   Patient is on Treatment Plan : MYELOMA RELAPSED REFRACTORY Daratumumab SQ + Lenalidomide + Dexamethasone (DaraRd) q28d     07/11/2021 Cancer Staging   Staging form: Plasma Cell Myeloma and Plasma Cell Disorders, AJCC 8th Edition - Clinical: Beta-2-microglobulin (mg/L): 2.6, Albumin (g/dL): 3.9, ISS: Stage I - Signed by Cammie Sickle, MD on 07/11/2021 Stage prefix: Initial diagnosis Beta 2 microglobulin range (mg/L): Less than 3.5 Albumin range (g/dL): Greater than or equal to 3.5     HISTORY OF PRESENTING ILLNESS: pt in a wheel chair; accompanied by her daughter.   Cristina Morgan 87 y.o.  female with  multiple myeloma currently on Rev-Dex is proceed with Dara SQ today.  Patient continues to be on 10 mg of Revlimid 3 weeks on 1 week off.    Blood pressures are better controlled.  Blood sugars are also well controlled-  States blood sugars are slightly lower 80s to 90s fasting.  She has gained some weight.  Patient states her back pain is improved.  Denies any worsening joint pains.  No falls.  No further episodes of mental status changes.  No diarrhea.   Review of Systems  Constitutional:  Positive for malaise/fatigue and weight loss. Negative for chills, diaphoresis and fever.  HENT:  Negative for nosebleeds and sore throat.   Eyes:  Negative for double vision.  Respiratory:  Negative for cough, hemoptysis, sputum production, shortness of breath and wheezing.   Cardiovascular:  Negative for chest pain, palpitations, orthopnea and leg swelling.  Gastrointestinal:  Negative for abdominal pain, blood in stool, constipation, diarrhea, heartburn, melena, nausea and vomiting.  Genitourinary:  Negative for dysuria, frequency and urgency.  Musculoskeletal:  Positive  for back pain and joint pain.  Skin: Negative.  Negative for itching and rash.  Neurological:  Negative for dizziness, tingling, focal weakness, weakness and  headaches.  Endo/Heme/Allergies:  Does not bruise/bleed easily.  Psychiatric/Behavioral:  Negative for depression. The patient is not nervous/anxious and does not have insomnia.      MEDICAL HISTORY:  Past Medical History:  Diagnosis Date   Anemia    CAD (coronary artery disease)    Cataracts, bilateral    Closed compression fracture of first lumbar vertebra (HCC)    Closed compression fracture of second lumbar vertebra (HCC)    Diabetes mellitus without complication (HCC)    High cholesterol    History of pneumonia 2010   [place]   History of uterine fibroid    Hypercalcemia    Hyperlipidemia    Hypertension    Lytic bone lesions on xray    Multiple myeloma (HCC)    Osteoporosis    Primary osteoarthritis of right knee     SURGICAL HISTORY: Past Surgical History:  Procedure Laterality Date   CATARACT EXTRACTION Bilateral 2014   COLONOSCOPY  2011   cleared for 5 yrs- [facility]   ECTOPIC PREGNANCY SURGERY      SOCIAL HISTORY: Social History   Socioeconomic History   Marital status: Married    Spouse name: Not on file   Number of children: 2   Years of education: Not on file   Highest education level: 12th grade  Occupational History   Occupation: Retired  Tobacco Use   Smoking status: Never    Passive exposure: Never   Smokeless tobacco: Never   Tobacco comments:    smoking cessation materials not required  Vaping Use   Vaping Use: Never used  Substance and Sexual Activity   Alcohol use: No   Drug use: No   Sexual activity: Not Currently  Other Topics Concern   Not on file  Social History Narrative   [place] with husband; drives. Never smoked; no alcohol. Daughters live close.    Social Determinants of Health   Financial Resource Strain: Low Risk  (06/12/2021)   Overall Financial Resource Strain (CARDIA)    Difficulty of Paying Living Expenses: Not very hard  Food Insecurity: No Food Insecurity (06/12/2021)   Hunger Vital Sign    Worried About  Running Out of Food in the Last Year: Never true    Ran Out of Food in the Last Year: Never true  Transportation Needs: No Transportation Needs (06/12/2021)   PRAPARE - Hydrologist (Medical): No    Lack of Transportation (Non-Medical): No  Physical Activity: Inactive (06/12/2021)   Exercise Vital Sign    Days of Exercise per Week: 0 days    Minutes of Exercise per Session: 0 min  Stress: No Stress Concern Present (06/12/2021)   [place]    Feeling of Stress : Only a little  Social Connections: Moderately Integrated (06/12/2021)   Social Connection and Isolation Panel [NHANES]    Frequency of Communication with Friends and Family: Three times a week    Frequency of Social Gatherings with Friends and Family: Three times a week    Attends Religious Services: 1 to 4 times per year    Active Member of Clubs or Organizations: No    Attends Archivist Meetings: Never    Marital Status: Married  Human resources officer Violence: Not At Risk (06/12/2021)   Humiliation, Afraid,  Rape, and Kick questionnaire    Fear of Current or Ex-Partner: No    Emotionally Abused: No    Physically Abused: No    Sexually Abused: No    FAMILY HISTORY: Family History  Problem Relation Age of Onset   Cancer Mother        breast   Heart disease Mother    Diabetes Father    Heart disease Father    Heart attack Father    Heart attack Brother    Diabetes Brother     ALLERGIES:  is allergic to latex and penicillins.  MEDICATIONS:  Current Outpatient Medications  Medication Sig Dispense Refill   ACCU-CHEK GUIDE test strip USE 1 STRIP TO CHECK GLUCOSE ONCE DAILY AS DIRECTED 100 each 0   Accu-Chek Softclix Lancets lancets USE 1  TO CHECK GLUCOSE ONCE DAILY 100 each 0   acetaminophen (TYLENOL) 325 MG tablet Take 325 mg by mouth 2 (two) times daily. START 2 days prior to chemo infusion. Take For 2 days; Do NOT  take on the day of infusion     acyclovir (ZOVIRAX) 400 MG tablet TAKE ONE TABLET BY MOUTH TWICE DAILY 60 tablet 4   BD PEN NEEDLE NANO 2ND GEN 32G X 4 MM MISC USE 1 ONCE DAILY     Blood Glucose Monitoring Suppl (ACCU-CHEK GUIDE) w/Device KIT USE AS DIRECTED TO CHECK GLUCOSE     Calcium Carbonate-Vit D-Min (CALCIUM 1200 PO) Take 1 capsule by mouth daily.     carvedilol (COREG) 3.125 MG tablet Take 1 tablet (3.125 mg total) by mouth 2 (two) times daily with a meal. 60 tablet 0   cholecalciferol (VITAMIN D3) 25 MCG (1000 UNIT) tablet Take 1,000 Units by mouth daily.     dexamethasone (DECADRON) 4 MG tablet Take 1 tablet (4 mg total) by mouth 2 (two) times daily. Start 2 days prior to infusion; Take it for 2 days. 60 tablet 3   diphenoxylate-atropine (LOMOTIL) 2.5-0.025 MG tablet Take 1 tablet by mouth 4 (four) times daily as needed for diarrhea or loose stools. Take it along with immodium 60 tablet 0   Glucerna (GLUCERNA) LIQD Take 237 mLs by mouth.     hydrOXYzine (ATARAX) 10 MG tablet Take 1 tablet (10 mg total) by mouth at bedtime as needed. 90 tablet 1   insulin glargine (LANTUS SOLOSTAR) 100 UNIT/ML Solostar Pen Inject 14 Units into the skin daily. 12 units every day except Wed and Thursday- 14 units 3 mL 0   lenalidomide (REVLIMID) 10 MG capsule Take 1 capsule (10 mg total) by mouth daily. Take for 21 days, then hold for 7 days. Repeat every 28 days. 21 capsule 3   losartan (COZAAR) 25 MG tablet TAKE ONE TABLET BY MOUTH EVERY EVENING 90 tablet 0   lovastatin (MEVACOR) 40 MG tablet Take 1 tablet (40 mg total) by mouth every evening. 90 tablet 1   metFORMIN (GLUCOPHAGE) 1000 MG tablet TAKE ONE TABLET BY MOUTH TWICE DAILY 180 tablet 0   montelukast (SINGULAIR) 10 MG tablet Take ONE tablet by MOUTH daily. START TWO DAYS prior TO chemo infusion. Take FOR TWO DAYS; DO NOT take ON THE DAY of infusion. 20 tablet 1   ondansetron (ZOFRAN) 4 MG tablet Take 1 tablet (4 mg total) by mouth every 6 (six) hours  as needed for nausea. 30 tablet 1   pantoprazole (PROTONIX) 40 MG tablet Take 1 tablet (40 mg total) by mouth daily. 90 tablet 1   sertraline (ZOLOFT) 25 MG  tablet Take 1 tablet (25 mg total) by mouth daily. 90 tablet 1   No current facility-administered medications for this visit.      Marland Kitchen  PHYSICAL EXAMINATION:  Vitals:   10/31/21 0900  BP: 135/62  Pulse: 71  Resp: 16  Temp: (!) 97.5 F (36.4 C)   Filed Weights   10/31/21 0900  Weight: 126 lb 6.4 oz (57.3 kg)    Physical Exam Vitals and nursing note reviewed.  HENT:     Head: Normocephalic and atraumatic.     Mouth/Throat:     Pharynx: Oropharynx is clear.  Eyes:     Extraocular Movements: Extraocular movements intact.     Pupils: Pupils are equal, round, and reactive to light.  Cardiovascular:     Rate and Rhythm: Normal rate and regular rhythm.  Pulmonary:     Comments: Decreased breath sounds bilaterally.  Abdominal:     Palpations: Abdomen is soft.  Musculoskeletal:        General: Normal range of motion.     Cervical back: Normal range of motion.  Skin:    General: Skin is warm.  Neurological:     General: No focal deficit present.     Mental Status: She is alert and oriented to person, place, and time.  Psychiatric:        Behavior: Behavior normal.        Judgment: Judgment normal.      LABORATORY DATA:  I have reviewed the data as listed Lab Results  Component Value Date   WBC 3.6 (L) 10/31/2021   HGB 9.4 (L) 10/31/2021   HCT 29.7 (L) 10/31/2021   MCV 100.7 (H) 10/31/2021   PLT 384 10/31/2021   Recent Labs    02/01/21 1502 02/18/21 1615 04/15/21 1549 05/01/21 0320 10/03/21 0854 10/17/21 0855 10/31/21 0908  NA 138  --   --    < > 134* 133* 136  K 4.3  --   --    < > 3.9 3.8 4.1  CL 101  --   --    < > 104 104 106  CO2 23  --   --    < > _0 GLUCOSE 178*  --   --    < > 122* 223* 184*  BUN 19  --   --    < > 28* 29* 21  CREATININE 0.84  --   --    < > 0.94 0.99 1.05*   CALCIUM 10.7*   < >  --    < > 9.7 9.0 9.1  GFRNONAA  --   --   --    < > 59* 56* 52*  PROT  --   --   --    < > 6.8 6.5 6.8  ALBUMIN 4.3  --  4.4   < > 4.2 3.8 3.9  AST 16  --  17   < > _1 ALT 9  --  8   < > _2 ALKPHOS 102  --  108   < > 72 74 63  BILITOT 0.3  --  0.3   < > 0.6 0.4 0.6  BILIDIR 0.12  --  0.10  --   --   --   --    < > = values in this interval not displayed.    RADIOGRAPHIC STUDIES: I have personally reviewed the radiological images as listed and agreed with the findings in  the report. No results found.  Multiple myeloma not having achieved remission ([place]) # STAGE I- MULTIPLE MYELOMA -Active [bone lesions; hypercalcemia; anemia; No renal insuffiencey;Bone marrow-32% plasma cell-   STANDARD Cytogenetics].  Currently on Revlimid-Dex- Dara SQ. Partial response noted; JULY 2023-M protein = 0.3 gm/fl; K/L= 1. 9.  STABLE.  # Proceed with Dara SQ today; Labs today reviewed;  acceptable for treatment today. On Revlimid at 10 mg dose 3 weeks on 1 week off. Keep dex 8 mg weekly.  STABLE.  # Diarrhea- improved on Revlimid 10 mg a day- 3 weeks-ON & 1 week OFF.  STABLE.  # Diabetes-on oral hypoglycemic agents; on long acting insulin; 14 units of lantus on T/W/Thursday- Defer to PCP.PBF- BG-122  Again recommend inform PCP regarding blood sugars log. STABLE.  # HTN: continue losartan; continue coreg; BP- 130s. STABLE  #Hypercalcemia secondary to multiple myeloma- [FEB 2023-Zometa ; calcitonin]-calcium today is 9.6. Zometa q 4W- .  # Weight loss-s/p re-veluation with [place] -STABLE  #DVT/shingles prophylaxis: Aspirin/acyclovir.STABLE  # IV access:PIV   * [place]- [phone]  zometa-q 4w; MM; K/L q 4 W  # DISPOSITION:  # proceed with  dara SQ; # follow up in 2 weeks/- MD; labs- cbc/cmp;Dara SQ;Zometa -;Dr.B      All questions were answered. The patient knows to call the clinic with any problems, questions or concerns.       Cammie Sickle,  MD 10/31/2021 11:06 PM

## 2021-10-31 NOTE — Patient Instructions (Signed)
[facility]  Discharge Instructions: Thank you for choosing [facility] to provide your oncology and hematology care.  If you have a lab appointment with the Cancer Center, please go directly to the Cancer Center and check in at the registration area.  Wear comfortable clothing and clothing appropriate for easy access to any Portacath or PICC line.   We strive to give you quality time with your provider. You may need to reschedule your appointment if you arrive late (15 or more minutes).  Arriving late affects you and other patients whose appointments are after yours.  Also, if you miss three or more appointments without notifying the office, you may be dismissed from the clinic at the provider's discretion.      For prescription refill requests, have your pharmacy contact our office and allow 72 hours for refills to be completed.    Today you received the following chemotherapy and/or immunotherapy agents Darzalex      To help prevent nausea and vomiting after your treatment, we encourage you to take your nausea medication as directed.  BELOW ARE SYMPTOMS THAT SHOULD BE REPORTED IMMEDIATELY: *FEVER GREATER THAN 100.4 F (38 C) OR HIGHER *CHILLS OR SWEATING *NAUSEA AND VOMITING THAT IS NOT CONTROLLED WITH YOUR NAUSEA MEDICATION *UNUSUAL SHORTNESS OF BREATH *UNUSUAL BRUISING OR BLEEDING *URINARY PROBLEMS (pain or burning when urinating, or frequent urination) *BOWEL PROBLEMS (unusual diarrhea, constipation, pain near the anus) TENDERNESS IN MOUTH AND THROAT WITH OR WITHOUT PRESENCE OF ULCERS (sore throat, sores in mouth, or a toothache) UNUSUAL RASH, SWELLING OR PAIN  UNUSUAL VAGINAL DISCHARGE OR ITCHING   Items with * indicate a potential emergency and should be followed up as soon as possible or go to the Emergency Department if any problems should occur.  Please show the CHEMOTHERAPY ALERT CARD or IMMUNOTHERAPY ALERT CARD at check-in to  the Emergency Department and triage nurse.  Should you have questions after your visit or need to cancel or reschedule your appointment, please contact [facility]  [phone] and follow the prompts.  Office hours are 8:00 a.m. to 4:30 p.m. Monday - Friday. Please note that voicemails left after 4:00 p.m. may not be returned until the following business day.  We are closed weekends and major holidays. You have access to a nurse at all times for urgent questions. Please call the main number to the clinic [phone] and follow the prompts.  For any non-urgent questions, you may also contact your provider using MyChart. We now offer e-Visits for anyone 18 and older to request care online for non-urgent symptoms. For details visit [email or web address].   Also download the MyChart app! Go to the app store, search "MyChart", open the app, select [place], and log in with your MyChart username and password.  Masks are optional in the cancer centers. If you would like for your care team to wear a mask while they are taking care of you, please let them know. For doctor visits, patients may have with them one support person who is at least 85 years old. At this time, visitors are not allowed in the infusion area.   

## 2021-11-01 LAB — KAPPA/LAMBDA LIGHT CHAINS
Kappa free light chain: 15.5 mg/L (ref 3.3–19.4)
Kappa, lambda light chain ratio: 1.41 (ref 0.26–1.65)
Lambda free light chains: 11 mg/L (ref 5.7–26.3)

## 2021-11-05 LAB — MULTIPLE MYELOMA PANEL, SERUM
Albumin SerPl Elph-Mcnc: 3.7 g/dL (ref 2.9–4.4)
Albumin/Glob SerPl: 1.6 (ref 0.7–1.7)
Alpha 1: 0.2 g/dL (ref 0.0–0.4)
Alpha2 Glob SerPl Elph-Mcnc: 0.7 g/dL (ref 0.4–1.0)
B-Globulin SerPl Elph-Mcnc: 1 g/dL (ref 0.7–1.3)
Gamma Glob SerPl Elph-Mcnc: 0.6 g/dL (ref 0.4–1.8)
Globulin, Total: 2.4 g/dL (ref 2.2–3.9)
IgA: 70 mg/dL (ref 64–422)
IgG (Immunoglobin G), Serum: 615 mg/dL (ref 586–1602)
IgM (Immunoglobulin M), Srm: 25 mg/dL — ABNORMAL LOW (ref 26–217)
M Protein SerPl Elph-Mcnc: 0.2 g/dL — ABNORMAL HIGH
Total Protein ELP: 6.1 g/dL (ref 6.0–8.5)

## 2021-11-12 NOTE — Progress Notes (Unsigned)
Subjective:   Cristina Morgan is a 87 y.o. female who presents for Medicare Annual (Subsequent) preventive examination.  I connected with  [name] on 11/13/21 by a audio enabled telemedicine application and verified that I am speaking with the correct person using two identifiers.  Patient Location: Home  Provider Location: Office/Clinic  I discussed the limitations of evaluation and management by telemedicine. The patient expressed understanding and agreed to proceed.   Review of Systems    Defer to PCP Cardiac Risk Factors include: advanced age (>34mn, >>60women)     Objective:    Today's Vitals   11/13/21 1132  PainSc: 0-No pain   There is no height or weight on file to calculate BMI.     11/13/2021   11:34 AM 10/31/2021    9:25 AM 10/17/2021    9:27 AM 10/03/2021    8:55 AM 09/19/2021    8:38 AM 09/05/2021    8:58 AM 08/22/2021    8:58 AM  Advanced Directives  Does Patient Have a Medical Advance Directive? No No No No No No No  Would patient like information on creating a medical advance directive? No - Guardian declined No - Patient declined No - Patient declined No - Patient declined No - Patient declined No - Patient declined No - Patient declined    Current Medications (verified) Outpatient Encounter Medications as of 11/13/2021  Medication Sig   ACCU-CHEK GUIDE test strip USE 1 STRIP TO CHECK GLUCOSE ONCE DAILY AS DIRECTED   Accu-Chek Softclix Lancets lancets USE 1  TO CHECK GLUCOSE ONCE DAILY   acetaminophen (TYLENOL) 325 MG tablet Take 325 mg by mouth 2 (two) times daily. START 2 days prior to chemo infusion. Take For 2 days; Do NOT take on the day of infusion   acyclovir (ZOVIRAX) 400 MG tablet TAKE ONE TABLET BY MOUTH TWICE DAILY   BD PEN NEEDLE NANO 2ND GEN 32G X 4 MM MISC USE 1 ONCE DAILY   Blood Glucose Monitoring Suppl (ACCU-CHEK GUIDE) w/Device KIT USE AS DIRECTED TO CHECK GLUCOSE   Calcium Carbonate-Vit D-Min (CALCIUM 1200 PO) Take 1 capsule by  mouth daily.   carvedilol (COREG) 3.125 MG tablet Take 1 tablet (3.125 mg total) by mouth 2 (two) times daily with a meal.   cholecalciferol (VITAMIN D3) 25 MCG (1000 UNIT) tablet Take 1,000 Units by mouth daily.   dexamethasone (DECADRON) 4 MG tablet Take 1 tablet (4 mg total) by mouth 2 (two) times daily. Start 2 days prior to infusion; Take it for 2 days.   diphenoxylate-atropine (LOMOTIL) 2.5-0.025 MG tablet Take 1 tablet by mouth 4 (four) times daily as needed for diarrhea or loose stools. Take it along with immodium   Glucerna (GLUCERNA) LIQD Take 237 mLs by mouth.   hydrOXYzine (ATARAX) 10 MG tablet Take 1 tablet (10 mg total) by mouth at bedtime as needed.   insulin glargine (LANTUS SOLOSTAR) 100 UNIT/ML Solostar Pen Inject 14 Units into the skin daily. 12 units every day except Wed and Thursday- 14 units   lenalidomide (REVLIMID) 10 MG capsule Take 1 capsule (10 mg total) by mouth daily. Take for 21 days, then hold for 7 days. Repeat every 28 days.   losartan (COZAAR) 25 MG tablet TAKE ONE TABLET BY MOUTH EVERY EVENING   lovastatin (MEVACOR) 40 MG tablet Take 1 tablet (40 mg total) by mouth every evening.   metFORMIN (GLUCOPHAGE) 1000 MG tablet TAKE ONE TABLET BY MOUTH TWICE DAILY   montelukast (  SINGULAIR) 10 MG tablet Take ONE tablet by MOUTH daily. START TWO DAYS prior TO chemo infusion. Take FOR TWO DAYS; DO NOT take ON THE DAY of infusion.   ondansetron (ZOFRAN) 4 MG tablet Take 1 tablet (4 mg total) by mouth every 6 (six) hours as needed for nausea.   pantoprazole (PROTONIX) 40 MG tablet Take 1 tablet (40 mg total) by mouth daily.   sertraline (ZOLOFT) 25 MG tablet Take 1 tablet (25 mg total) by mouth daily.   No facility-administered encounter medications on file as of 11/13/2021.    Allergies (verified) Latex and Penicillins   History: Past Medical History:  Diagnosis Date   Anemia    CAD (coronary artery disease)    Cataracts, bilateral    Closed compression fracture of  first lumbar vertebra (HCC)    Closed compression fracture of second lumbar vertebra (HCC)    Diabetes mellitus without complication (HCC)    High cholesterol    History of pneumonia 2010   [place]   History of uterine fibroid    Hypercalcemia    Hyperlipidemia    Hypertension    Lytic bone lesions on xray    Multiple myeloma (HCC)    Osteoporosis    Primary osteoarthritis of right knee    Past Surgical History:  Procedure Laterality Date   CATARACT EXTRACTION Bilateral 2014   COLONOSCOPY  2011   cleared for 5 yrs- [facility]   ECTOPIC PREGNANCY SURGERY     Family History  Problem Relation Age of Onset   Cancer Mother        breast   Heart disease Mother    Diabetes Father    Heart disease Father    Heart attack Father    Heart attack Brother    Diabetes Brother    Social History   Socioeconomic History   Marital status: Married    Spouse name: Not on file   Number of children: 2   Years of education: Not on file   Highest education level: 12th grade  Occupational History   Occupation: Retired  Tobacco Use   Smoking status: Never    Passive exposure: Never   Smokeless tobacco: Never   Tobacco comments:    smoking cessation materials not required  Vaping Use   Vaping Use: Never used  Substance and Sexual Activity   Alcohol use: No   Drug use: No   Sexual activity: Not Currently  Other Topics Concern   Not on file  Social History Narrative   [place] with husband; drives. Never smoked; no alcohol. Daughters live close.    Social Determinants of Health   Financial Resource Strain: Low Risk  (11/13/2021)   Overall Financial Resource Strain (CARDIA)    Difficulty of Paying Living Expenses: Not hard at all  Food Insecurity: No Food Insecurity (11/13/2021)   Hunger Vital Sign    Worried About Running Out of Food in the Last Year: Never true    Ran Out of Food in the Last Year: Never true  Transportation Needs: No Transportation Needs (11/13/2021)    PRAPARE - Hydrologist (Medical): No    Lack of Transportation (Non-Medical): No  Physical Activity: Sufficiently Active (11/13/2021)   Exercise Vital Sign    Days of Exercise per Week: 7 days    Minutes of Exercise per Session: 30 min  Stress: No Stress Concern Present (11/13/2021)   [place]  Feeling of Stress : Not at all  Social Connections: Moderately Integrated (11/13/2021)   Social Connection and Isolation Panel [NHANES]    Frequency of Communication with Friends and Family: Twice a week    Frequency of Social Gatherings with Friends and Family: More than three times a week    Attends Religious Services: 1 to 4 times per year    Active Member of [employer] or Organizations: No    Attends Music therapist: Never    Marital Status: Married    Tobacco Counseling Counseling given: Not Answered Tobacco comments: smoking cessation materials not required   Clinical Intake:  Pre-visit preparation completed: Yes  Pain : No/denies pain Pain Score: 0-No pain     Nutritional Risks: None Diabetes: No     Diabetic? No.     Information entered by :: Wyatt Haste, [place] of Daily Living    11/13/2021   11:36 AM 05/07/2021    2:12 AM  In your present state of health, do you have any difficulty performing the following activities:  Hearing? 1   Vision? 0 0  Difficulty concentrating or making decisions? 0   Walking or climbing stairs? 0   Dressing or bathing? 0   Doing errands, shopping? 0   Preparing Food and eating ? N   Using the Toilet? N   In the past six months, have you accidently leaked urine? N   Do you have problems with loss of bowel control? N   Managing your Medications? N   Managing your Finances? N   Housekeeping or managing your Housekeeping? N     Patient Care Team: Juline Patch, MD as PCP - General (Family Medicine) Vladimir Faster, [facility] (Inactive) (Pharmacist) Cammie Sickle, MD as Consulting Physician (Oncology)  Indicate any recent Medical Services you may have received from other than [facility] providers in the past year (date may be approximate).     Assessment:   This is a routine wellness examination for Lynisha.  Hearing/Vision screen Hearing Screening - Comments:: Some hearing loss. Does not wear hearing aids. Right ear is worse than left.  Vision Screening - Comments:: Wears reading glasses.  Dietary issues and exercise activities discussed: Current Exercise Habits: The patient does not participate in regular exercise at present   Goals Addressed   None   Depression Screen    11/13/2021   11:32 AM 08/30/2021    2:10 PM 07/01/2021    3:04 PM 06/12/2021    2:33 PM 05/17/2021    1:42 PM 02/01/2021    2:15 PM 12/12/2020    8:35 AM  PHQ 2/9 Scores  PHQ - 2 Score 0 1 0 2 4 0 0  PHQ- 9 Score  2 0 '6 8 2 4    ' Fall Risk    11/13/2021   11:35 AM 08/30/2021    2:10 PM 12/12/2020    8:36 AM 11/07/2020   11:35 AM 04/09/2020   11:09 AM  Fall Risk   Falls in the past year? 0 0 1 0 0  Number falls in past yr: 0 0 1 0 0  Injury with Fall? 0 0 1 0 0  Risk for fall due to : History of fall(s);Impaired balance/gait No Fall Risks Impaired balance/gait No Fall Risks No Fall Risks  Follow up Falls evaluation completed Falls evaluation completed Falls evaluation completed Falls prevention discussed Falls prevention discussed    FALL RISK PREVENTION PERTAINING TO THE HOME:  Any  stairs in or around the home? Yes  If so, are there any without handrails? Yes  Home free of loose throw rugs in walkways, pet beds, electrical cords, etc? No  Adequate lighting in your home to reduce risk of falls? No   ASSISTIVE DEVICES UTILIZED TO PREVENT FALLS:  Life alert? No  Use of a cane, walker or w/c? Yes  Grab bars in the bathroom? Yes  Shower chair or bench in shower? Yes  Elevated toilet seat or a handicapped  toilet? Yes   TIMED UP AND GO:  Was the test performed? No .    Cognitive Function:        11/13/2021   11:37 AM 08/03/2019    3:00 PM 09/07/2017    9:24 AM  6CIT Screen  What Year? 0 points 0 points 0 points  What month? 0 points 0 points 0 points  What time? 0 points 0 points 0 points  Count back from 20 2 points 0 points 0 points  Months in reverse 0 points 0 points 0 points  Repeat phrase 6 points 4 points 4 points  Total Score 8 points 4 points 4 points    Immunizations Immunization History  Administered Date(s) Administered   Fluad Quad(high Dose 65+) 01/25/2019, 12/02/2019, 12/12/2020   Influenza, High Dose Seasonal PF 01/07/2017, 01/25/2018   Influenza,inj,Quad PF,6+ Mos 01/03/2015, 12/27/2015   Moderna Sars-Covid-2 Vaccination 05/11/2019, 06/08/2019, 07/05/2019, 02/17/2020, 11/12/2020   Pneumococcal Conjugate-13 01/07/2017   Pneumococcal Polysaccharide-23 01/25/2019    TDAP status: Due, Education has been provided regarding the importance of this vaccine. Advised may receive this vaccine at local pharmacy or Health Dept. Aware to provide a copy of the vaccination record if obtained from local pharmacy or Health Dept. Verbalized acceptance and understanding.  Flu Vaccine status: Due, Education has been provided regarding the importance of this vaccine. Advised may receive this vaccine at local pharmacy or Health Dept. Aware to provide a copy of the vaccination record if obtained from local pharmacy or Health Dept. Verbalized acceptance and understanding.  Pneumococcal vaccine status: Up to date  Covid-19 vaccine status: Completed vaccines  Qualifies for Shingles Vaccine? Yes   Zostavax completed No   Shingrix Completed?: No.    Education has been provided regarding the importance of this vaccine. Patient has been advised to call insurance company to determine out of pocket expense if they have not yet received this vaccine. Advised may also receive vaccine at local  pharmacy or Health Dept. Verbalized acceptance and understanding.  Screening Tests Health Maintenance  Topic Date Due   Zoster Vaccines- Shingrix (1 of 2) Never done   OPHTHALMOLOGY EXAM  05/15/2020   COVID-19 Vaccine (6 - Moderna risk series) 01/07/2021   INFLUENZA VACCINE  10/15/2021   TETANUS/TDAP  05/18/2022 (Originally 04/06/1953)   HEMOGLOBIN A1C  03/01/2022   FOOT EXAM  07/02/2022   DEXA SCAN  04/08/2024   Pneumonia Vaccine 78+ Years old  Completed   HPV VACCINES  Aged Out    Health Maintenance  Health Maintenance Due  Topic Date Due   Zoster Vaccines- Shingrix (1 of 2) Never done   OPHTHALMOLOGY EXAM  05/15/2020   COVID-19 Vaccine (6 - Moderna risk series) 01/07/2021   INFLUENZA VACCINE  10/15/2021    Colorectal cancer screening: No longer required.   Mammogram status: No longer required due to age.  Bone Density status: Completed 04/08/2021. Results reflect: Bone density results: OSTEOPOROSIS. Repeat every 3 years.  Lung Cancer Screening: (Low Dose CT Chest  recommended if Age 28-80 years, 63 pack-year currently smoking OR have quit w/in 15years.) does not qualify.   Lung Cancer Screening Referral: N/A  Additional Screening:  Hepatitis C Screening: does not qualify.  Vision Screening: Recommended annual ophthalmology exams for early detection of glaucoma and other disorders of the eye. Is the patient up to date with their annual eye exam?  Yes  Who is the provider or what is the name of the office in which the patient attends annual eye exams?  [facility] [place] If pt is not established with a provider, would they like to be referred to a provider to establish care? No .   Dental Screening: Recommended annual dental exams for proper oral hygiene  Community Resource Referral / Chronic Care Management: CRR required this visit?  No   CCM required this visit?  No      Plan:     I have personally reviewed and noted the following in the patient's chart:    Medical and social history Use of alcohol, tobacco or illicit drugs  Current medications and supplements including opioid prescriptions. Patient is not currently taking opioid prescriptions. Functional ability and status Nutritional status Physical activity Advanced directives List of other physicians Hospitalizations, surgeries, and ER visits in previous 12 months Vitals Screenings to include cognitive, depression, and falls Referrals and appointments  In addition, I have reviewed and discussed with patient certain preventive protocols, quality metrics, and best practice recommendations. A written personalized care plan for preventive services as well as general preventive health recommendations were provided to patient.     Clista Bernhardt, [place]   11/13/2021   Nurse Notes: None.   Ms. Coto , Thank you for taking time to come for your Medicare Wellness Visit. I appreciate your ongoing commitment to your health goals. Please review the following plan we discussed and let me know if I can assist you in the future.   These are the goals we discussed:  Goals      DIET - INCREASE WATER INTAKE     Recommend to drink at least 6-8 8oz glasses of water per day.     Pharmacy Care Plan     CARE PLAN ENTRY (see longitudinal plan of care for additional care plan information)  Current Barriers:  Chronic Disease Management support, education, and care coordination needs related to Hypertension, Hyperlipidemia, and Diabetes   Hypertension BP Readings from Last 3 Encounters:  12/02/19 130/80  04/19/19 130/60  01/25/19 130/62  Pharmacist Clinical Goal(s): Over the next 120 days, patient will work with PharmD and providers to maintain BP goal <130/80 Current regimen:  Losartan 25 mg daily Interventions: Comprehensive medication review performed, medication list updated in electronic medical record Reviewed proper BP technique Provided diet and exercise counseling. Patient self  care activities - Over the next 120 days, patient will: Check BP 3 times weekly document, and provide at future appointments Ensure daily salt intake < 2300 mg/day Continue walking regimen with goal of 30 minutes/day 5 days/week.    Diabetes Lab Results  Component Value Date/Time   HGBA1C 7.7 (H) 12/02/2019 09:54 AM   HGBA1C 6.7 (H) 01/25/2019 10:06 AM  Pharmacist Clinical Goal(s): Over the next 120 days, patient will work with PharmD and providers to achieve A1c goal <7% Current regimen:  Glipizide xl 10 mg qd (recent increase) Metformin 1051m bid Interventions: Comprehensive medication review performed, medication list updated in electronic medical record Inter-disciplinary care team collaboration (see longitudinal plan of care) Discuss  checking B12 level with PCP Patient self care activities - Over the next 120 days, patient will: Check blood sugar once daily, document, and provide at future appointments Contact provider with any episodes of hypoglycemia Report for follow up A1C  Medication management Pharmacist Clinical Goal(s): Over the next 120 days, patient will work with PharmD and providers to maintain optimal medication adherence Current pharmacy: [facility] Inter[facility] Comprehensive medication review performed. Utilize [facility] pharmacy for medication synchronization, packaging and delivery Patient self care activities - Over the next 60 days, patient will: Focus on medication adherence by utilizing packaging and delivery Take medications as prescribed Report any questions or concerns to PharmD and/or provider(s)  Please see past updates related to this goal by clicking on the "Past Updates" button in the selected goal          This is a list of the screening recommended for you and due dates:  Health Maintenance  Topic Date Due   Zoster (Shingles) Vaccine (1 of 2) Never done   Eye exam for diabetics  05/15/2020   COVID-19 Vaccine (6 - Moderna risk series)  01/07/2021   Flu Shot  10/15/2021   Tetanus Vaccine  05/18/2022*   Hemoglobin A1C  03/01/2022   Complete foot exam   07/02/2022   DEXA scan (bone density measurement)  04/08/2024   Pneumonia Vaccine  Completed   HPV Vaccine  Aged Out  *Topic was postponed. The date shown is not the original due date.

## 2021-11-13 ENCOUNTER — Ambulatory Visit (INDEPENDENT_AMBULATORY_CARE_PROVIDER_SITE_OTHER): Payer: Medicare Other

## 2021-11-13 DIAGNOSIS — Z0000 Encounter for general adult medical examination without abnormal findings: Secondary | ICD-10-CM

## 2021-11-14 ENCOUNTER — Inpatient Hospital Stay: Payer: Medicare Other

## 2021-11-14 ENCOUNTER — Other Ambulatory Visit (HOSPITAL_COMMUNITY): Payer: Self-pay

## 2021-11-14 ENCOUNTER — Other Ambulatory Visit: Payer: Self-pay

## 2021-11-14 ENCOUNTER — Inpatient Hospital Stay (HOSPITAL_BASED_OUTPATIENT_CLINIC_OR_DEPARTMENT_OTHER): Payer: Medicare Other | Admitting: Internal Medicine

## 2021-11-14 ENCOUNTER — Encounter: Payer: Self-pay | Admitting: Internal Medicine

## 2021-11-14 VITALS — BP 151/62 | HR 69 | Temp 96.8°F | Ht 66.0 in | Wt 126.6 lb

## 2021-11-14 VITALS — BP 169/68 | Temp 97.5°F

## 2021-11-14 DIAGNOSIS — R197 Diarrhea, unspecified: Secondary | ICD-10-CM | POA: Diagnosis not present

## 2021-11-14 DIAGNOSIS — C9000 Multiple myeloma not having achieved remission: Secondary | ICD-10-CM

## 2021-11-14 DIAGNOSIS — D649 Anemia, unspecified: Secondary | ICD-10-CM | POA: Diagnosis not present

## 2021-11-14 DIAGNOSIS — Z5112 Encounter for antineoplastic immunotherapy: Secondary | ICD-10-CM | POA: Diagnosis not present

## 2021-11-14 DIAGNOSIS — E119 Type 2 diabetes mellitus without complications: Secondary | ICD-10-CM | POA: Diagnosis not present

## 2021-11-14 LAB — COMPREHENSIVE METABOLIC PANEL
ALT: 18 U/L (ref 0–44)
AST: 24 U/L (ref 15–41)
Albumin: 3.9 g/dL (ref 3.5–5.0)
Alkaline Phosphatase: 65 U/L (ref 38–126)
Anion gap: 8 (ref 5–15)
BUN: 23 mg/dL (ref 8–23)
CO2: 24 mmol/L (ref 22–32)
Calcium: 9.2 mg/dL (ref 8.9–10.3)
Chloride: 103 mmol/L (ref 98–111)
Creatinine, Ser: 1.09 mg/dL — ABNORMAL HIGH (ref 0.44–1.00)
GFR, Estimated: 50 mL/min — ABNORMAL LOW (ref >60)
Glucose, Bld: 186 mg/dL — ABNORMAL HIGH (ref 70–99)
Potassium: 4.5 mmol/L (ref 3.5–5.1)
Sodium: 135 mmol/L (ref 135–145)
Total Bilirubin: 0.6 mg/dL (ref 0.3–1.2)
Total Protein: 6.6 g/dL (ref 6.5–8.1)

## 2021-11-14 LAB — CBC WITH DIFFERENTIAL/PLATELET
Abs Immature Granulocytes: 0.02 10*3/uL (ref 0.00–0.07)
Basophils Absolute: 0 10*3/uL (ref 0.0–0.1)
Basophils Relative: 1 %
Eosinophils Absolute: 0 10*3/uL (ref 0.0–0.5)
Eosinophils Relative: 0 %
HCT: 29.1 % — ABNORMAL LOW (ref 36.0–46.0)
Hemoglobin: 9.2 g/dL — ABNORMAL LOW (ref 12.0–15.0)
Immature Granulocytes: 0 %
Lymphocytes Relative: 22 %
Lymphs Abs: 1.5 10*3/uL (ref 0.7–4.0)
MCH: 32.1 pg (ref 26.0–34.0)
MCHC: 31.6 g/dL (ref 30.0–36.0)
MCV: 101.4 fL — ABNORMAL HIGH (ref 80.0–100.0)
Monocytes Absolute: 0.4 10*3/uL (ref 0.1–1.0)
Monocytes Relative: 6 %
Neutro Abs: 5 10*3/uL (ref 1.7–7.7)
Neutrophils Relative %: 71 %
Platelets: 298 10*3/uL (ref 150–400)
RBC: 2.87 MIL/uL — ABNORMAL LOW (ref 3.87–5.11)
RDW: 14.7 % (ref 11.5–15.5)
WBC: 6.9 10*3/uL (ref 4.0–10.5)
nRBC: 0 % (ref 0.0–0.2)

## 2021-11-14 MED ORDER — ACETAMINOPHEN 325 MG PO TABS
650.0000 mg | ORAL_TABLET | Freq: Once | ORAL | Status: AC
  Administered 2021-11-14: 650 mg via ORAL
  Filled 2021-11-14: qty 2

## 2021-11-14 MED ORDER — DEXAMETHASONE 4 MG PO TABS
20.0000 mg | ORAL_TABLET | Freq: Once | ORAL | Status: AC
  Administered 2021-11-14: 20 mg via ORAL
  Filled 2021-11-14: qty 5

## 2021-11-14 MED ORDER — SODIUM CHLORIDE 0.9 % IV SOLN
Freq: Once | INTRAVENOUS | Status: AC
  Filled 2021-11-14: qty 250

## 2021-11-14 MED ORDER — DIPHENHYDRAMINE HCL 25 MG PO CAPS
50.0000 mg | ORAL_CAPSULE | Freq: Once | ORAL | Status: AC
  Administered 2021-11-14: 50 mg via ORAL
  Filled 2021-11-14: qty 2

## 2021-11-14 MED ORDER — ZOLEDRONIC ACID 4 MG/5ML IV CONC
3.0000 mg | Freq: Once | INTRAVENOUS | Status: AC
  Administered 2021-11-14: 3 mg via INTRAVENOUS
  Filled 2021-11-14: qty 3.75

## 2021-11-14 MED ORDER — DARATUMUMAB-HYALURONIDASE-FIHJ 1800-30000 MG-UT/15ML ~~LOC~~ SOLN
1800.0000 mg | Freq: Once | SUBCUTANEOUS | Status: AC
  Administered 2021-11-14: 1800 mg via SUBCUTANEOUS
  Filled 2021-11-14: qty 15

## 2021-11-14 NOTE — Assessment & Plan Note (Addendum)
#  STAGE I- MULTIPLE MYELOMA -Active [bone lesions; hypercalcemia; anemia; No renal insuffiencey;Bone marrow-32% plasma cell-   STANDARD [employer]].  Currently on Revlimid-Dex- Dara SQ. Partial response noted; AUG 2023-M protein = 0.2 gm/fl; K/L= 1. 5.  STABLE.  # Proceed with Dara SQ today; Labs today reviewed;  acceptable for treatment today. On Revlimid at 10 mg dose 3 weeks on 1 week off. Keep dex 8 mg weekly.  STABLE.  # Diarrhea- improved on Revlimid 10 mg a day- 3 weeks-ON & 1 week OFF.  STABLE.  # Diabetes-on oral hypoglycemic agents; on long acting insulin; 14 units of lantus on T/W/Thursday- Defer to PCP.PBF- [id number]  Again recommend inform PCP regarding blood sugars log. STABLE.  # HTN: continue losartan; continue coreg; BP- 130s. STABLE.  #Hypercalcemia secondary to multiple myeloma- [FEB 2023-Zometa ; calcitonin]-calcium today is 9.6. Zometa q 4W- .  # Weight loss-s/p re-veluation with Joli -STABLE  #DVT/shingles prophylaxis: Aspirin/acyclovir.STABLE  # Vaccinations; Ok with flu/COVID shot  # IV access:PIV   [name]- [phone]  zometa-q 4w- 8/31/2-23; MM; K/L q 4 W  # DISPOSITION:  # proceed with  dara SQ; Zometa  # follow up in 2 weeks/- MD; labs- cbc/cmp;Dara SQ;-;Dr.B

## 2021-11-14 NOTE — Patient Instructions (Signed)
[facility] CANCER CTR AT [place]  Discharge Instructions: Thank you for choosing [facility] to provide your oncology and hematology care.  If you have a lab appointment with the [facility], please go directly to the [facility] and check in at the registration area.  Wear comfortable clothing and clothing appropriate for easy access to any Portacath or PICC line.   We strive to give you quality time with your provider. You may need to reschedule your appointment if you arrive late (15 or more minutes).  Arriving late affects you and other patients whose appointments are after yours.  Also, if you miss three or more appointments without notifying the office, you may be dismissed from the clinic at the provider's discretion.      For prescription refill requests, have your pharmacy contact our office and allow 72 hours for refills to be completed.    Today you received the following chemotherapy and/or immunotherapy agents Darzalex Faspro      To help prevent nausea and vomiting after your treatment, we encourage you to take your nausea medication as directed.  BELOW ARE SYMPTOMS THAT SHOULD BE REPORTED IMMEDIATELY: *FEVER GREATER THAN 100.4 F (38 C) OR HIGHER *CHILLS OR SWEATING *NAUSEA AND VOMITING THAT IS NOT CONTROLLED WITH YOUR NAUSEA MEDICATION *UNUSUAL SHORTNESS OF BREATH *UNUSUAL BRUISING OR BLEEDING *URINARY PROBLEMS (pain or burning when urinating, or frequent urination) *BOWEL PROBLEMS (unusual diarrhea, constipation, pain near the anus) TENDERNESS IN MOUTH AND THROAT WITH OR WITHOUT PRESENCE OF ULCERS (sore throat, sores in mouth, or a toothache) UNUSUAL RASH, SWELLING OR PAIN  UNUSUAL VAGINAL DISCHARGE OR ITCHING   Items with * indicate a potential emergency and should be followed up as soon as possible or go to the Emergency Department if any problems should occur.  Please show the CHEMOTHERAPY ALERT CARD or IMMUNOTHERAPY ALERT CARD at  check-in to the Emergency Department and triage nurse.  Should you have questions after your visit or need to cancel or reschedule your appointment, please contact [facility] CANCER [place] AT [place]  [phone] and follow the prompts.  Office hours are 8:00 a.m. to 4:30 p.m. Monday - Friday. Please note that voicemails left after 4:00 p.m. may not be returned until the following business day.  We are closed weekends and major holidays. You have access to a nurse at all times for urgent questions. Please call the main number to the clinic [phone] and follow the prompts.  For any non-urgent questions, you may also contact your provider using MyChart. We now offer e-Visits for anyone 35 and older to request care online for non-urgent symptoms. For details visit [email or web address].   Also download the MyChart app! Go to the app store, search "MyChart", open the app, select Early, and log in with your MyChart username and password.  Masks are optional in the cancer centers. If you would like for your care team to wear a mask while they are taking care of you, please let them know. For doctor visits, patients may have with them one support person who is at least 85 years old. At this time, visitors are not allowed in the infusion area.   Zoledronic Acid Injection (Cancer) What is this medication? ZOLEDRONIC ACID (ZOE le dron ik AS id) treats high calcium levels in the blood caused by cancer. It may also be used with chemotherapy to treat weakened bones caused by cancer. It works by slowing down the release of calcium from bones. This lowers calcium  levels in your blood. It also makes your bones stronger and less likely to break (fracture). It belongs to a group of medications called bisphosphonates. This medicine may be used for other purposes; ask your health care provider or pharmacist if you have questions. COMMON BRAND NAME(S): Zometa, Zometa Powder What should I tell my  care team before I take this medication? They need to know if you have any of these conditions: Dehydration Dental disease Kidney disease Liver disease Low levels of calcium in the blood Lung or breathing disease, such as asthma Receiving steroids, such as dexamethasone or prednisone An unusual or allergic reaction to zoledronic acid, other medications, foods, dyes, or preservatives Pregnant or trying to get pregnant Breast-feeding How should I use this medication? This medication is injected into a vein. It is given by your care team in a hospital or clinic setting. Talk to your care team about the use of this medication in children. Special care may be needed. Overdosage: If you think you have taken too much of this medicine contact a poison control center or emergency room at once. NOTE: This medicine is only for you. Do not share this medicine with others. What if I miss a dose? Keep appointments for follow-up doses. It is important not to miss your dose. Call your care team if you are unable to keep an appointment. What may interact with this medication? Certain antibiotics given by injection Diuretics, such as bumetanide, furosemide NSAIDs, medications for pain and inflammation, such as ibuprofen or naproxen Teriparatide Thalidomide This list may not describe all possible interactions. Give your health care provider a list of all the medicines, herbs, non-prescription drugs, or dietary supplements you use. Also tell them if you smoke, drink alcohol, or use illegal drugs. Some items may interact with your medicine. What should I watch for while using this medication? Visit your care team for regular checks on your progress. It may be some time before you see the benefit from this medication. Some people who take this medication have severe bone, joint, or muscle pain. This medication may also increase your risk for jaw problems or a broken thigh bone. Tell your care team right away  if you have severe pain in your jaw, bones, joints, or muscles. Tell you care team if you have any pain that does not go away or that gets worse. Tell your dentist and dental surgeon that you are taking this medication. You should not have major dental surgery while on this medication. See your dentist to have a dental exam and fix any dental problems before starting this medication. Take good care of your teeth while on this medication. Make sure you see your dentist for regular follow-up appointments. You should make sure you get enough calcium and vitamin D while you are taking this medication. Discuss the foods you eat and the vitamins you take with your care team. Check with your care team if you have severe diarrhea, nausea, and vomiting, or if you sweat a lot. The loss of too much body fluid may make it dangerous for you to take this medication. You may need bloodwork while taking this medication. Talk to your care team if you wish to become pregnant or think you might be pregnant. This medication can cause serious birth defects. What side effects may I notice from receiving this medication? Side effects that you should report to your care team as soon as possible: Allergic reactions--skin rash, itching, hives, swelling of the face, lips, tongue,  or throat Kidney injury--decrease in the amount of urine, swelling of the ankles, hands, or feet Low calcium level--muscle pain or cramps, confusion, tingling, or numbness in the hands or feet Osteonecrosis of the jaw--pain, swelling, or redness in the mouth, numbness of the jaw, poor healing after dental work, unusual discharge from the mouth, visible bones in the mouth Severe bone, joint, or muscle pain Side effects that usually do not require medical attention (report to your care team if they continue or are bothersome): Constipation Fatigue Fever Loss of appetite Nausea Stomach pain This list may not describe all possible side effects. Call  your doctor for medical advice about side effects. You may report side effects to FDA at 1-800-FDA-1088. Where should I keep my medication? This medication is given in a hospital or clinic. It will not be stored at home. NOTE: This sheet is a summary. It may not cover all possible information. If you have questions about this medicine, talk to your doctor, pharmacist, or health care provider.  2023 Elsevier/Gold Standard (2021-04-18 00:00:00)

## 2021-11-14 NOTE — Progress Notes (Signed)
Should she get another covid booster?

## 2021-11-14 NOTE — Progress Notes (Signed)
No wait/monitoring needed post Darzalex Faspro injection per Dr. Rogue Bussing.

## 2021-11-14 NOTE — Progress Notes (Signed)
[place] NOTE  Patient Care Team: Juline Patch, MD as PCP - General (Family Medicine) Vladimir Faster, [facility] (Inactive) (Pharmacist) Cammie Sickle, MD as Consulting Physician (Oncology)  CHIEF COMPLAINTS/PURPOSE OF CONSULTATION: Multiple myeloma  Oncology History Overview Note  # MULTIPLE MYELOMA  [FEB 2023-hypercalcemia status changes]-Active [bone lesions; hypercalcemia; anemia; No renal insuffiencey;Bone marrow-32% plasma cell-   STANDARD Cytogenetics].FEB 2023- S/p dexamethasone 20 mg q day x4.   # REV [15 mg -3 week-On and 1 week-OFF]-DARA; May 2023-discontinued rev 15 diarrhea/hypotension  # MAY 2023- dara-Rev 10 mg 3w/1w   # FEB 2023-hypercalcemia [[facility]-status post calcitonin; bisphosphonate]  BONE MARROW, ASPIRATE, CLOT, CORE:  -Hypercellular bone marrow with plasma cell neoplasm  -See comment   PERIPHERAL BLOOD:  -Normocytic-normochromic anemia   COMMENT:   The bone marrow is hypercellular for age with increased number of  atypical plasma cells representing 32% of all cells in the aspirate  associated with interstitial infiltrates and numerous variably sized  clusters in the clot/biopsy sections.  The plasma cells display weak  kappa light chain restriction consistent with plasma cell neoplasm.  Correlation with cytogenetic and FISH studies is recommended   MICROSCOPIC DESCRIPTION:   PERIPHERAL BLOOD SMEAR: The red blood cells display mild  anisopoikilocytosis with mild polychromasia.  The white blood cells are  normal number with scattered hypogranular neutrophils. An occasional  myelocyte and large atypical mononuclear cells are seen on scan. The  platelets are normal in number.    Multiple myeloma not having achieved remission ([place])  05/15/2021 Initial Diagnosis   Multiple myeloma not having achieved remission ([place])   06/27/2021 - 10/17/2021 Chemotherapy   Patient is on Treatment Plan : MYELOMA Daratumumab SQ + Lenalidomide +  Dexamethasone (DaraRd) q28d     06/27/2021 -  Chemotherapy   Patient is on Treatment Plan : MYELOMA RELAPSED REFRACTORY Daratumumab SQ + Lenalidomide + Dexamethasone (DaraRd) q28d     07/11/2021 Cancer Staging   Staging form: Plasma Cell Myeloma and Plasma Cell Disorders, AJCC 8th Edition - Clinical: Beta-2-microglobulin (mg/L): 2.6, Albumin (g/dL): 3.9, ISS: Stage I - Signed by Cammie Sickle, MD on 07/11/2021 Stage prefix: Initial diagnosis Beta 2 microglobulin range (mg/L): Less than 3.5 Albumin range (g/dL): Greater than or equal to 3.5     HISTORY OF PRESENTING ILLNESS: pt in a wheel chair; accompanied by her daughter.   Cristina Morgan 87 y.o.  female with  multiple myeloma currently on Rev-Dex is proceed with Dara SQ today.  Patient continues to be on 10 mg of Revlimid 3 weeks on 1 week off.    Blood pressures are better controlled.  Blood sugars are also well controlled-  States blood sugars are slightly lower 80s to 90s fasting.  She has gained some weight.  Patient states her back pain is improved.  Denies any worsening joint pains.  No falls.  No further episodes of mental status changes.  No diarrhea.   Review of Systems  Constitutional:  Positive for malaise/fatigue and weight loss. Negative for chills, diaphoresis and fever.  HENT:  Negative for nosebleeds and sore throat.   Eyes:  Negative for double vision.  Respiratory:  Negative for cough, hemoptysis, sputum production, shortness of breath and wheezing.   Cardiovascular:  Negative for chest pain, palpitations, orthopnea and leg swelling.  Gastrointestinal:  Negative for abdominal pain, blood in stool, constipation, diarrhea, heartburn, melena, nausea and vomiting.  Genitourinary:  Negative for dysuria, frequency and urgency.  Musculoskeletal:  Positive  for back pain and joint pain.  Skin: Negative.  Negative for itching and rash.  Neurological:  Negative for dizziness, tingling, focal weakness, weakness and  headaches.  Endo/Heme/Allergies:  Does not bruise/bleed easily.  Psychiatric/Behavioral:  Negative for depression. The patient is not nervous/anxious and does not have insomnia.      MEDICAL HISTORY:  Past Medical History:  Diagnosis Date   Anemia    CAD (coronary artery disease)    Cataracts, bilateral    Closed compression fracture of first lumbar vertebra (HCC)    Closed compression fracture of second lumbar vertebra (HCC)    Diabetes mellitus without complication (HCC)    High cholesterol    History of pneumonia 2010   [place]   History of uterine fibroid    Hypercalcemia    Hyperlipidemia    Hypertension    Lytic bone lesions on xray    Multiple myeloma (HCC)    Osteoporosis    Primary osteoarthritis of right knee     SURGICAL HISTORY: Past Surgical History:  Procedure Laterality Date   CATARACT EXTRACTION Bilateral 2014   COLONOSCOPY  2011   cleared for 5 yrs- [facility]   ECTOPIC PREGNANCY SURGERY      SOCIAL HISTORY: Social History   Socioeconomic History   Marital status: Married    Spouse name: Not on file   Number of children: 2   Years of education: Not on file   Highest education level: 12th grade  Occupational History   Occupation: Retired  Tobacco Use   Smoking status: Never    Passive exposure: Never   Smokeless tobacco: Never   Tobacco comments:    smoking cessation materials not required  Vaping Use   Vaping Use: Never used  Substance and Sexual Activity   Alcohol use: No   Drug use: No   Sexual activity: Not Currently  Other Topics Concern   Not on file  Social History Narrative   [place] with husband; drives. Never smoked; no alcohol. Daughters live close.    Social Determinants of Health   Financial Resource Strain: Low Risk  (11/13/2021)   Overall Financial Resource Strain (CARDIA)    Difficulty of Paying Living Expenses: Not hard at all  Food Insecurity: No Food Insecurity (11/13/2021)   Hunger Vital Sign    Worried  About Running Out of Food in the Last Year: Never true    Ran Out of Food in the Last Year: Never true  Transportation Needs: No Transportation Needs (11/13/2021)   PRAPARE - Administrator, Civil Service (Medical): No    Lack of Transportation (Non-Medical): No  Physical Activity: Sufficiently Active (11/13/2021)   Exercise Vital Sign    Days of Exercise per Week: 7 days    Minutes of Exercise per Session: 30 min  Stress: No Stress Concern Present (11/13/2021)   [employer] of Occupational Health - Occupational Stress Questionnaire    Feeling of Stress : Not at all  Social Connections: Moderately Integrated (11/13/2021)   Social Connection and Isolation Panel [NHANES]    Frequency of Communication with Friends and Family: Twice a week    Frequency of Social Gatherings with Friends and Family: More than three times a week    Attends Religious Services: 1 to 4 times per year    Active Member of [employer] or Organizations: No    Attends Banker Meetings: Never    Marital Status: Married  Catering manager Violence: Not At Risk (11/13/2021)  Humiliation, Afraid, Rape, and Kick questionnaire    Fear of Current or Ex-Partner: No    Emotionally Abused: No    Physically Abused: No    Sexually Abused: No    FAMILY HISTORY: Family History  Problem Relation Age of Onset   Cancer Mother        breast   Heart disease Mother    Diabetes Father    Heart disease Father    Heart attack Father    Heart attack Brother    Diabetes Brother     ALLERGIES:  is allergic to latex and penicillins.  MEDICATIONS:  Current Outpatient Medications  Medication Sig Dispense Refill   ACCU-CHEK GUIDE test strip USE 1 STRIP TO CHECK GLUCOSE ONCE DAILY AS DIRECTED 100 each 0   Accu-Chek Softclix Lancets lancets USE 1  TO CHECK GLUCOSE ONCE DAILY 100 each 0   acetaminophen (TYLENOL) 325 MG tablet Take 325 mg by mouth 2 (two) times daily. START 2 days prior to chemo infusion. Take For  2 days; Do NOT take on the day of infusion     acyclovir (ZOVIRAX) 400 MG tablet TAKE ONE TABLET BY MOUTH TWICE DAILY 60 tablet 4   BD PEN NEEDLE NANO 2ND GEN 32G X 4 MM MISC USE 1 ONCE DAILY     Blood Glucose Monitoring Suppl (ACCU-CHEK GUIDE) w/Device KIT USE AS DIRECTED TO CHECK GLUCOSE     Calcium Carbonate-Vit D-Min (CALCIUM 1200 PO) Take 1 capsule by mouth daily.     carvedilol (COREG) 3.125 MG tablet Take 1 tablet (3.125 mg total) by mouth 2 (two) times daily with a meal. 60 tablet 0   cholecalciferol (VITAMIN D3) 25 MCG (1000 UNIT) tablet Take 1,000 Units by mouth daily.     dexamethasone (DECADRON) 4 MG tablet Take 1 tablet (4 mg total) by mouth 2 (two) times daily. Start 2 days prior to infusion; Take it for 2 days. 60 tablet 3   diphenoxylate-atropine (LOMOTIL) 2.5-0.025 MG tablet Take 1 tablet by mouth 4 (four) times daily as needed for diarrhea or loose stools. Take it along with immodium 60 tablet 0   Glucerna (GLUCERNA) LIQD Take 237 mLs by mouth.     hydrOXYzine (ATARAX) 10 MG tablet Take 1 tablet (10 mg total) by mouth at bedtime as needed. 90 tablet 1   insulin glargine (LANTUS SOLOSTAR) 100 UNIT/ML Solostar Pen Inject 14 Units into the skin daily. 12 units every day except Wed and Thursday- 14 units 3 mL 0   lenalidomide (REVLIMID) 10 MG capsule Take 1 capsule (10 mg total) by mouth daily. Take for 21 days, then hold for 7 days. Repeat every 28 days. 21 capsule 3   losartan (COZAAR) 25 MG tablet TAKE ONE TABLET BY MOUTH EVERY EVENING 90 tablet 0   lovastatin (MEVACOR) 40 MG tablet Take 1 tablet (40 mg total) by mouth every evening. 90 tablet 1   metFORMIN (GLUCOPHAGE) 1000 MG tablet TAKE ONE TABLET BY MOUTH TWICE DAILY 180 tablet 0   montelukast (SINGULAIR) 10 MG tablet Take ONE tablet by MOUTH daily. START TWO DAYS prior TO chemo infusion. Take FOR TWO DAYS; DO NOT take ON THE DAY of infusion. 20 tablet 1   ondansetron (ZOFRAN) 4 MG tablet Take 1 tablet (4 mg total) by mouth  every 6 (six) hours as needed for nausea. 30 tablet 1   pantoprazole (PROTONIX) 40 MG tablet Take 1 tablet (40 mg total) by mouth daily. 90 tablet 1   sertraline (ZOLOFT)  25 MG tablet Take 1 tablet (25 mg total) by mouth daily. 90 tablet 1   No current facility-administered medications for this visit.      Marland Kitchen  PHYSICAL EXAMINATION:  Vitals:   11/14/21 1025  BP: (!) 151/62  Pulse: 69  Temp: (!) 96.8 F (36 C)  SpO2: 95%   Filed Weights   11/14/21 1025  Weight: 126 lb 9.6 oz (57.4 kg)    Physical Exam Vitals and nursing note reviewed.  HENT:     Head: Normocephalic and atraumatic.     Mouth/Throat:     Pharynx: Oropharynx is clear.  Eyes:     Extraocular Movements: Extraocular movements intact.     Pupils: Pupils are equal, round, and reactive to light.  Cardiovascular:     Rate and Rhythm: Normal rate and regular rhythm.  Pulmonary:     Comments: Decreased breath sounds bilaterally.  Abdominal:     Palpations: Abdomen is soft.  Musculoskeletal:        General: Normal range of motion.     Cervical back: Normal range of motion.  Skin:    General: Skin is warm.  Neurological:     General: No focal deficit present.     Mental Status: She is alert and oriented to person, place, and time.  Psychiatric:        Behavior: Behavior normal.        Judgment: Judgment normal.      LABORATORY DATA:  I have reviewed the data as listed Lab Results  Component Value Date   WBC 6.9 11/14/2021   HGB 9.2 (L) 11/14/2021   HCT 29.1 (L) 11/14/2021   MCV 101.4 (H) 11/14/2021   PLT 298 11/14/2021   Recent Labs    02/01/21 1502 02/18/21 1615 04/15/21 1549 05/01/21 0320 10/17/21 0855 10/31/21 0908 11/14/21 1006  NA 138  --   --    < > 133* 136 135  K 4.3  --   --    < > 3.8 4.1 4.5  CL 101  --   --    < > 104 106 103  CO2 23  --   --    < > $R'22 23 24  'Uy$ GLUCOSE 178*  --   --    < > 223* 184* 186*  BUN 19  --   --    < > 29* 21 23  CREATININE 0.84  --   --    < > 0.99  1.05* 1.09*  CALCIUM 10.7*   < >  --    < > 9.0 9.1 9.2  GFRNONAA  --   --   --    < > 56* 52* 50*  PROT  --   --   --    < > 6.5 6.8 6.6  ALBUMIN 4.3  --  4.4   < > 3.8 3.9 3.9  AST 16  --  17   < > $R'24 24 24  'OB$ ALT 9  --  8   < > $R'17 16 18  'um$ ALKPHOS 102  --  108   < > 74 63 65  BILITOT 0.3  --  0.3   < > 0.4 0.6 0.6  BILIDIR 0.12  --  0.10  --   --   --   --    < > = values in this interval not displayed.    RADIOGRAPHIC STUDIES: I have personally reviewed the radiological images as listed and agreed with the  findings in the report. No results found.  Multiple myeloma not having achieved remission ([place]) # STAGE I- MULTIPLE MYELOMA -Active [bone lesions; hypercalcemia; anemia; No renal insuffiencey;Bone marrow-32% plasma cell-   STANDARD Cytogenetics].  Currently on Revlimid-Dex- Dara SQ. Partial response noted; AUG 2023-M protein = 0.2 gm/fl; K/L= 1. 5.  STABLE.  # Proceed with Dara SQ today; Labs today reviewed;  acceptable for treatment today. On Revlimid at 10 mg dose 3 weeks on 1 week off. Keep dex 8 mg weekly.  STABLE.  # Diarrhea- improved on Revlimid 10 mg a day- 3 weeks-ON & 1 week OFF.  STABLE.  # Diabetes-on oral hypoglycemic agents; on long acting insulin; 14 units of lantus on T/W/Thursday- Defer to PCP.PBF- BG-122  Again recommend inform PCP regarding blood sugars log. STABLE.  # HTN: continue losartan; continue coreg; BP- 130s. STABLE.  #Hypercalcemia secondary to multiple myeloma- [FEB 2023-Zometa ; calcitonin]-calcium today is 9.6. Zometa q 4W- .  # Weight loss-s/p re-veluation with Joli -STABLE  #DVT/shingles prophylaxis: Aspirin/acyclovir.STABLE  # Vaccinations; Ok with flu/COVID shot  # IV access:PIV   [name]- [phone]  zometa-q 4w- 8/31/2-23; MM; K/L q 4 W  # DISPOSITION:  # proceed with  dara SQ; Zometa  # follow up in 2 weeks/- MD; labs- cbc/cmp;Dara SQ;-;Dr.B      All questions were answered. The patient knows to call the clinic with any  problems, questions or concerns.       Cammie Sickle, MD 11/14/2021 12:55 PM

## 2021-11-18 ENCOUNTER — Other Ambulatory Visit: Payer: Self-pay | Admitting: Internal Medicine

## 2021-11-18 DIAGNOSIS — F329 Major depressive disorder, single episode, unspecified: Secondary | ICD-10-CM

## 2021-11-18 DIAGNOSIS — F5102 Adjustment insomnia: Secondary | ICD-10-CM

## 2021-11-19 ENCOUNTER — Other Ambulatory Visit: Payer: Self-pay

## 2021-11-19 ENCOUNTER — Encounter: Payer: Self-pay | Admitting: Internal Medicine

## 2021-11-27 ENCOUNTER — Other Ambulatory Visit: Payer: Self-pay | Admitting: Family Medicine

## 2021-11-27 DIAGNOSIS — I10 Essential (primary) hypertension: Secondary | ICD-10-CM

## 2021-11-27 DIAGNOSIS — E119 Type 2 diabetes mellitus without complications: Secondary | ICD-10-CM

## 2021-11-28 ENCOUNTER — Inpatient Hospital Stay: Payer: Medicare Other

## 2021-11-28 ENCOUNTER — Inpatient Hospital Stay (HOSPITAL_BASED_OUTPATIENT_CLINIC_OR_DEPARTMENT_OTHER): Payer: Medicare Other | Admitting: Internal Medicine

## 2021-11-28 ENCOUNTER — Other Ambulatory Visit: Payer: Self-pay

## 2021-11-28 ENCOUNTER — Inpatient Hospital Stay: Payer: Medicare Other | Attending: Nurse Practitioner

## 2021-11-28 ENCOUNTER — Encounter: Payer: Self-pay | Admitting: Internal Medicine

## 2021-11-28 VITALS — BP 157/64 | HR 65 | Temp 98.0°F | Resp 16 | Ht 66.0 in | Wt 127.4 lb

## 2021-11-28 VITALS — BP 167/65 | HR 73 | Temp 97.8°F

## 2021-11-28 DIAGNOSIS — C9000 Multiple myeloma not having achieved remission: Secondary | ICD-10-CM | POA: Insufficient documentation

## 2021-11-28 DIAGNOSIS — I959 Hypotension, unspecified: Secondary | ICD-10-CM | POA: Insufficient documentation

## 2021-11-28 DIAGNOSIS — Z7982 Long term (current) use of aspirin: Secondary | ICD-10-CM | POA: Diagnosis not present

## 2021-11-28 DIAGNOSIS — E119 Type 2 diabetes mellitus without complications: Secondary | ICD-10-CM | POA: Diagnosis not present

## 2021-11-28 DIAGNOSIS — D649 Anemia, unspecified: Secondary | ICD-10-CM | POA: Insufficient documentation

## 2021-11-28 DIAGNOSIS — Z5112 Encounter for antineoplastic immunotherapy: Secondary | ICD-10-CM | POA: Diagnosis not present

## 2021-11-28 DIAGNOSIS — R197 Diarrhea, unspecified: Secondary | ICD-10-CM | POA: Insufficient documentation

## 2021-11-28 LAB — CBC WITH DIFFERENTIAL/PLATELET
Abs Immature Granulocytes: 0.01 10*3/uL (ref 0.00–0.07)
Basophils Absolute: 0 10*3/uL (ref 0.0–0.1)
Basophils Relative: 1 %
Eosinophils Absolute: 0 10*3/uL (ref 0.0–0.5)
Eosinophils Relative: 0 %
HCT: 29.2 % — ABNORMAL LOW (ref 36.0–46.0)
Hemoglobin: 9.3 g/dL — ABNORMAL LOW (ref 12.0–15.0)
Immature Granulocytes: 0 %
Lymphocytes Relative: 34 %
Lymphs Abs: 1.5 10*3/uL (ref 0.7–4.0)
MCH: 32.2 pg (ref 26.0–34.0)
MCHC: 31.8 g/dL (ref 30.0–36.0)
MCV: 101 fL — ABNORMAL HIGH (ref 80.0–100.0)
Monocytes Absolute: 0.8 10*3/uL (ref 0.1–1.0)
Monocytes Relative: 17 %
Neutro Abs: 2.2 10*3/uL (ref 1.7–7.7)
Neutrophils Relative %: 48 %
Platelets: 215 10*3/uL (ref 150–400)
RBC: 2.89 MIL/uL — ABNORMAL LOW (ref 3.87–5.11)
RDW: 14.1 % (ref 11.5–15.5)
WBC: 4.5 10*3/uL (ref 4.0–10.5)
nRBC: 0 % (ref 0.0–0.2)

## 2021-11-28 LAB — COMPREHENSIVE METABOLIC PANEL
ALT: 14 U/L (ref 0–44)
AST: 23 U/L (ref 15–41)
Albumin: 3.6 g/dL (ref 3.5–5.0)
Alkaline Phosphatase: 64 U/L (ref 38–126)
Anion gap: 6 (ref 5–15)
BUN: 22 mg/dL (ref 8–23)
CO2: 24 mmol/L (ref 22–32)
Calcium: 9.3 mg/dL (ref 8.9–10.3)
Chloride: 107 mmol/L (ref 98–111)
Creatinine, Ser: 0.91 mg/dL (ref 0.44–1.00)
GFR, Estimated: >60 mL/min (ref >60)
Glucose, Bld: 174 mg/dL — ABNORMAL HIGH (ref 70–99)
Potassium: 4.1 mmol/L (ref 3.5–5.1)
Sodium: 137 mmol/L (ref 135–145)
Total Bilirubin: 0.5 mg/dL (ref 0.3–1.2)
Total Protein: 6.3 g/dL — ABNORMAL LOW (ref 6.5–8.1)

## 2021-11-28 MED ORDER — ACETAMINOPHEN 325 MG PO TABS
650.0000 mg | ORAL_TABLET | Freq: Once | ORAL | Status: AC
  Administered 2021-11-28: 650 mg via ORAL
  Filled 2021-11-28: qty 2

## 2021-11-28 MED ORDER — DIPHENHYDRAMINE HCL 25 MG PO CAPS
50.0000 mg | ORAL_CAPSULE | Freq: Once | ORAL | Status: AC
  Administered 2021-11-28: 50 mg via ORAL
  Filled 2021-11-28: qty 2

## 2021-11-28 MED ORDER — DARATUMUMAB-HYALURONIDASE-FIHJ 1800-30000 MG-UT/15ML ~~LOC~~ SOLN
1800.0000 mg | Freq: Once | SUBCUTANEOUS | Status: AC
  Administered 2021-11-28: 1800 mg via SUBCUTANEOUS
  Filled 2021-11-28: qty 15

## 2021-11-28 MED ORDER — DEXAMETHASONE 4 MG PO TABS
20.0000 mg | ORAL_TABLET | Freq: Once | ORAL | Status: AC
  Administered 2021-11-28: 20 mg via ORAL
  Filled 2021-11-28: qty 5

## 2021-11-28 NOTE — Progress Notes (Unsigned)
Patient denies new problems/concerns today.   °

## 2021-11-28 NOTE — Assessment & Plan Note (Signed)
#  STAGE I- MULTIPLE MYELOMA -Active [bone lesions; hypercalcemia; anemia; No renal insuffiencey;Bone marrow-32% plasma cell-   STANDARD Cytogenetics].  Currently on Revlimid-Dex- Dara SQ. Partial response noted; AUG 2023-M protein = 0.2 gm/fl; K/L= 1. 5.  STABLE.  # Proceed with Dara SQ today; Labs today reviewed;  acceptable for treatment today. On Revlimid at 10 mg dose 3 weeks on 1 week off. Keep dex 8 mg weekly.   STABLE  # Diarrhea- improved on Revlimid 10 mg a day- 3 weeks-ON & 1 week OFF.  STABLE.  # Diabetes-on oral hypoglycemic agents; on long acting insulin; 14 units of lantus on T/W/Thursday- Defer to PCP.PBF- BG-122  Again recommend inform PCP regarding blood sugars log.STABLE  # HTN: continue losartan; continue coreg; BP- 130s. STABLE  #Hypercalcemia secondary to multiple myeloma- [FEB 2023-Zometa ; calcitonin]-calcium today is 9.6. Zometa q 4W- .  # Weight loss-s/p re-veluation with Joli -STABLE  #DVT/shingles prophylaxis: Aspirin/acyclovir.STABLE  # Vaccinations; Ok with flu/COVID shot  # IV access:PIV   [name]- [phone]  zometa-q 4w- ; MM; K/L q 4 W  # DISPOSITION:  # proceed with  dara SQ;  # follow up in 2 weeks/- MD; labs- cbc/cmp MM panel;K/L light chains; Zometa - ;Dara SQ;-;Dr.B

## 2021-11-28 NOTE — Progress Notes (Unsigned)
[place] NOTE  Patient Care Team: Juline Patch, MD as PCP - General (Family Medicine) Vladimir Faster, [facility] (Inactive) (Pharmacist) Cammie Sickle, MD as Consulting Physician (Oncology)  CHIEF COMPLAINTS/PURPOSE OF CONSULTATION: Multiple myeloma  Oncology History Overview Note  # MULTIPLE MYELOMA  [FEB 2023-hypercalcemia status changes]-Active [bone lesions; hypercalcemia; anemia; No renal insuffiencey;Bone marrow-32% plasma cell-   STANDARD Cytogenetics].FEB 2023- S/p dexamethasone 20 mg q day x4.   # REV [15 mg -3 week-On and 1 week-OFF]-DARA; May 2023-discontinued rev 15 diarrhea/hypotension  # MAY 2023- dara-Rev 10 mg 3w/1w   # FEB 2023-hypercalcemia [[facility]-status post calcitonin; bisphosphonate]  BONE MARROW, ASPIRATE, CLOT, CORE:  -Hypercellular bone marrow with plasma cell neoplasm  -See comment   PERIPHERAL BLOOD:  -Normocytic-normochromic anemia   COMMENT:   The bone marrow is hypercellular for age with increased number of  atypical plasma cells representing 32% of all cells in the aspirate  associated with interstitial infiltrates and numerous variably sized  clusters in the clot/biopsy sections.  The plasma cells display weak  kappa light chain restriction consistent with plasma cell neoplasm.  Correlation with cytogenetic and FISH studies is recommended   MICROSCOPIC DESCRIPTION:   PERIPHERAL BLOOD SMEAR: The red blood cells display mild  anisopoikilocytosis with mild polychromasia.  The white blood cells are  normal number with scattered hypogranular neutrophils. An occasional  myelocyte and large atypical mononuclear cells are seen on scan. The  platelets are normal in number.    Multiple myeloma not having achieved remission ([place])  05/15/2021 Initial Diagnosis   Multiple myeloma not having achieved remission ([place])   06/27/2021 - 10/17/2021 Chemotherapy   Patient is on Treatment Plan : MYELOMA Daratumumab SQ + Lenalidomide +  Dexamethasone (DaraRd) q28d     06/27/2021 -  Chemotherapy   Patient is on Treatment Plan : MYELOMA RELAPSED REFRACTORY Daratumumab SQ + Lenalidomide + Dexamethasone (DaraRd) q28d     07/11/2021 Cancer Staging   Staging form: Plasma Cell Myeloma and Plasma Cell Disorders, AJCC 8th Edition - Clinical: Beta-2-microglobulin (mg/L): 2.6, Albumin (g/dL): 3.9, ISS: Stage I - Signed by Cammie Sickle, MD on 07/11/2021 Stage prefix: Initial diagnosis Beta 2 microglobulin range (mg/L): Less than 3.5 Albumin range (g/dL): Greater than or equal to 3.5     HISTORY OF PRESENTING ILLNESS: pt in a wheel chair; accompanied by her daughter.   Cristina Morgan 87 y.o.  female with  multiple myeloma currently on Rev-Dex is proceed with Dara SQ today.  Patient continues to be on 10 mg of Revlimid 3 weeks on 1 week off.    Blood pressures are better controlled.  Blood sugars are also well controlled-  States blood sugars are slightly lower 80s to 90s fasting.  She has gained some weight.  Patient states her back pain is improved.  Denies any worsening joint pains.  No falls.  No further episodes of mental status changes.  No diarrhea.   Review of Systems  Constitutional:  Positive for malaise/fatigue and weight loss. Negative for chills, diaphoresis and fever.  HENT:  Negative for nosebleeds and sore throat.   Eyes:  Negative for double vision.  Respiratory:  Negative for cough, hemoptysis, sputum production, shortness of breath and wheezing.   Cardiovascular:  Negative for chest pain, palpitations, orthopnea and leg swelling.  Gastrointestinal:  Negative for abdominal pain, blood in stool, constipation, diarrhea, heartburn, melena, nausea and vomiting.  Genitourinary:  Negative for dysuria, frequency and urgency.  Musculoskeletal:  Positive  for back pain and joint pain.  Skin: Negative.  Negative for itching and rash.  Neurological:  Negative for dizziness, tingling, focal weakness, weakness and  headaches.  Endo/Heme/Allergies:  Does not bruise/bleed easily.  Psychiatric/Behavioral:  Negative for depression. The patient is not nervous/anxious and does not have insomnia.      MEDICAL HISTORY:  Past Medical History:  Diagnosis Date  . Anemia   . CAD (coronary artery disease)   . Cataracts, bilateral   . Closed compression fracture of first lumbar vertebra ([place])   . Closed compression fracture of second lumbar vertebra ([place])   . Diabetes mellitus without complication ([place])   . High cholesterol   . History of pneumonia 2010   [facility]  . History of uterine fibroid   . Hypercalcemia   . Hyperlipidemia   . Hypertension   . Lytic bone lesions on xray   . Multiple myeloma ([place])   . Osteoporosis   . Primary osteoarthritis of right knee     SURGICAL HISTORY: Past Surgical History:  Procedure Laterality Date  . CATARACT EXTRACTION Bilateral 2014  . COLONOSCOPY  2011   cleared for 5 yrs- [facility]  . ECTOPIC PREGNANCY SURGERY      SOCIAL HISTORY: Social History   Socioeconomic History  . Marital status: Married    Spouse name: Not on file  . Number of children: 2  . Years of education: Not on file  . Highest education level: 12th grade  Occupational History  . Occupation: Retired  Tobacco Use  . Smoking status: Never    Passive exposure: Never  . Smokeless tobacco: Never  . Tobacco comments:    smoking cessation materials not required  Vaping Use  . Vaping Use: Never used  Substance and Sexual Activity  . Alcohol use: No  . Drug use: No  . Sexual activity: Not Currently  Other Topics Concern  . Not on file  Social History Narrative   [place] with husband; drives. Never smoked; no alcohol. Daughters live close.    Social Determinants of Health   Financial Resource Strain: Low Risk  (11/13/2021)   Overall Financial Resource Strain (CARDIA)   . Difficulty of Paying Living Expenses: Not hard at all  Food Insecurity: No Food Insecurity (11/13/2021)    Hunger Vital Sign   . Worried About Charity fundraiser in the Last Year: Never true   . Ran Out of Food in the Last Year: Never true  Transportation Needs: No Transportation Needs (11/13/2021)   PRAPARE - Transportation   . Lack of Transportation (Medical): No   . Lack of Transportation (Non-Medical): No  Physical Activity: Sufficiently Active (11/13/2021)   Exercise Vital Sign   . Days of Exercise per Week: 7 days   . Minutes of Exercise per Session: 30 min  Stress: No Stress Concern Present (11/13/2021)   [place]   . Feeling of Stress : Not at all  Social Connections: Moderately Integrated (11/13/2021)   Social Connection and Isolation Panel [NHANES]   . Frequency of Communication with Friends and Family: Twice a week   . Frequency of Social Gatherings with Friends and Family: More than three times a week   . Attends Religious Services: 1 to 4 times per year   . Active Member of Clubs or Organizations: No   . Attends Archivist Meetings: Never   . Marital Status: Married  Human resources officer Violence: Not At Risk (11/13/2021)  Humiliation, Afraid, Rape, and Kick questionnaire   . Fear of Current or Ex-Partner: No   . Emotionally Abused: No   . Physically Abused: No   . Sexually Abused: No    FAMILY HISTORY: Family History  Problem Relation Age of Onset  . Cancer Mother        breast  . Heart disease Mother   . Diabetes Father   . Heart disease Father   . Heart attack Father   . Heart attack Brother   . Diabetes Brother     ALLERGIES:  is allergic to latex and penicillins.  MEDICATIONS:  Current Outpatient Medications  Medication Sig Dispense Refill  . ACCU-CHEK GUIDE test strip USE 1 STRIP TO CHECK GLUCOSE ONCE DAILY AS DIRECTED 100 each 0  . Accu-Chek Softclix Lancets lancets USE 1  TO CHECK GLUCOSE ONCE DAILY 100 each 0  . acetaminophen (TYLENOL) 325 MG tablet Take 325 mg by mouth 2 (two)  times daily. START 2 days prior to chemo infusion. Take For 2 days; Do NOT take on the day of infusion    . acyclovir (ZOVIRAX) 400 MG tablet TAKE ONE TABLET BY MOUTH TWICE DAILY 60 tablet 4  . BD PEN NEEDLE NANO 2ND GEN 32G X 4 MM MISC USE 1 ONCE DAILY    . Blood Glucose Monitoring Suppl (ACCU-CHEK GUIDE) w/Device KIT USE AS DIRECTED TO CHECK GLUCOSE    . Calcium Carbonate-Vit D-Min (CALCIUM 1200 PO) Take 1 capsule by mouth daily.    . carvedilol (COREG) 3.125 MG tablet Take 1 tablet (3.125 mg total) by mouth 2 (two) times daily with a meal. 60 tablet 0  . cholecalciferol (VITAMIN D3) 25 MCG (1000 UNIT) tablet Take 1,000 Units by mouth daily.    Marland Kitchen dexamethasone (DECADRON) 4 MG tablet Take 1 tablet (4 mg total) by mouth 2 (two) times daily. Start 2 days prior to infusion; Take it for 2 days. 60 tablet 3  . diphenoxylate-atropine (LOMOTIL) 2.5-0.025 MG tablet Take 1 tablet by mouth 4 (four) times daily as needed for diarrhea or loose stools. Take it along with immodium 60 tablet 0  . Glucerna (GLUCERNA) LIQD Take 237 mLs by mouth.    . hydrOXYzine (ATARAX) 10 MG tablet Take 1 tablet (10 mg total) by mouth at bedtime as needed. 90 tablet 1  . insulin glargine (LANTUS SOLOSTAR) 100 UNIT/ML Solostar Pen Inject 14 Units into the skin daily. 12 units every day except Wed and Thursday- 14 units 3 mL 0  . lenalidomide (REVLIMID) 10 MG capsule Take 1 capsule (10 mg total) by mouth daily. Take for 21 days, then hold for 7 days. Repeat every 28 days. 21 capsule 3  . losartan (COZAAR) 25 MG tablet TAKE ONE TABLET BY MOUTH EVERY EVENING 90 tablet 0  . lovastatin (MEVACOR) 40 MG tablet Take 1 tablet (40 mg total) by mouth every evening. 90 tablet 1  . metFORMIN (GLUCOPHAGE) 1000 MG tablet TAKE ONE TABLET BY MOUTH TWICE DAILY 180 tablet 0  . montelukast (SINGULAIR) 10 MG tablet Take ONE tablet by MOUTH daily. START TWO DAYS prior TO chemo infusion. Take FOR TWO DAYS; DO NOT take ON THE DAY of infusion. 20 tablet 1   . ondansetron (ZOFRAN) 4 MG tablet Take 1 tablet (4 mg total) by mouth every 6 (six) hours as needed for nausea. 30 tablet 1  . pantoprazole (PROTONIX) 40 MG tablet Take 1 tablet (40 mg total) by mouth daily. 90 tablet 1  . sertraline (ZOLOFT)  25 MG tablet TAKE ONE TABLET BY MOUTH ONCE DAILY 90 tablet 1   No current facility-administered medications for this visit.      Marland Kitchen  PHYSICAL EXAMINATION:  Vitals:   11/28/21 1100  BP: (!) 157/64  Pulse: 65  Resp: 16  Temp: 98 F (36.7 C)   Filed Weights   11/28/21 1100  Weight: 127 lb 6.4 oz (57.8 kg)    Physical Exam Vitals and nursing note reviewed.  HENT:     Head: Normocephalic and atraumatic.     Mouth/Throat:     Pharynx: Oropharynx is clear.  Eyes:     Extraocular Movements: Extraocular movements intact.     Pupils: Pupils are equal, round, and reactive to light.  Cardiovascular:     Rate and Rhythm: Normal rate and regular rhythm.  Pulmonary:     Comments: Decreased breath sounds bilaterally.  Abdominal:     Palpations: Abdomen is soft.  Musculoskeletal:        General: Normal range of motion.     Cervical back: Normal range of motion.  Skin:    General: Skin is warm.  Neurological:     General: No focal deficit present.     Mental Status: She is alert and oriented to person, place, and time.  Psychiatric:        Behavior: Behavior normal.        Judgment: Judgment normal.     LABORATORY DATA:  I have reviewed the data as listed Lab Results  Component Value Date   WBC 4.5 11/28/2021   HGB 9.3 (L) 11/28/2021   HCT 29.2 (L) 11/28/2021   MCV 101.0 (H) 11/28/2021   PLT 215 11/28/2021   Recent Labs    02/01/21 1502 02/18/21 1615 04/15/21 1549 05/01/21 0320 10/31/21 0908 11/14/21 1006 11/28/21 1018  NA 138  --   --    < > 136 135 137  K 4.3  --   --    < > 4.1 4.5 4.1  CL 101  --   --    < > 106 103 107  CO2 23  --   --    < > $R'23 24 24  'Pe$ GLUCOSE 178*  --   --    < > 184* 186* 174*  BUN 19  --   --     < > $R'21 23 22  'AY$ CREATININE 0.84  --   --    < > 1.05* 1.09* 0.91  CALCIUM 10.7*   < >  --    < > 9.1 9.2 9.3  GFRNONAA  --   --   --    < > 52* 50* >60  PROT  --   --   --    < > 6.8 6.6 6.3*  ALBUMIN 4.3  --  4.4   < > 3.9 3.9 3.6  AST 16  --  17   < > $R'24 24 23  'XX$ ALT 9  --  8   < > $R'16 18 14  'tQ$ ALKPHOS 102  --  108   < > 63 65 64  BILITOT 0.3  --  0.3   < > 0.6 0.6 0.5  BILIDIR 0.12  --  0.10  --   --   --   --    < > = values in this interval not displayed.     RADIOGRAPHIC STUDIES: I have personally reviewed the radiological images as listed and agreed with the findings in the  report. No results found.  No problem-specific Assessment & Plan notes found for this encounter.    All questions were answered. The patient knows to call the clinic with any problems, questions or concerns.       Cammie Sickle, MD 11/28/2021 11:11 AM

## 2021-11-29 ENCOUNTER — Encounter: Payer: Self-pay | Admitting: Internal Medicine

## 2021-11-29 MED ORDER — LENALIDOMIDE 10 MG PO CAPS: 10.0000 mg | ORAL_CAPSULE | Freq: Every day | ORAL | 3 refills | Status: DC

## 2021-12-02 MED ORDER — LENALIDOMIDE 10 MG PO CAPS: 10.0000 mg | ORAL_CAPSULE | Freq: Every day | ORAL | 0 refills | Status: DC

## 2021-12-02 NOTE — Addendum Note (Signed)
Addended by: Darl Pikes on: 12/02/2021 10:27 AM   Modules accepted: Orders

## 2021-12-02 NOTE — Telephone Encounter (Addendum)
Received a call from patient's daughter Cristina Morgan letting me know her mom's Revlimid Rx was sent to her local pharmacy. Redirected the Revlimid Rx to [facility] following the call.   Cristina Morgan was able to start her mom on Revlimid as scheduled last week with extra capsules she had at home, but today is the last capsule she has handy. With medication shipping time she will likely miss a few doses before [facility] can delivery medication. Instructed Cristina Morgan to stay on schedule and stop as planned on cycle day 21.

## 2021-12-12 ENCOUNTER — Inpatient Hospital Stay: Payer: Medicare Other

## 2021-12-12 ENCOUNTER — Inpatient Hospital Stay (HOSPITAL_BASED_OUTPATIENT_CLINIC_OR_DEPARTMENT_OTHER): Payer: Medicare Other | Admitting: Internal Medicine

## 2021-12-12 DIAGNOSIS — C9000 Multiple myeloma not having achieved remission: Secondary | ICD-10-CM

## 2021-12-12 DIAGNOSIS — I10 Essential (primary) hypertension: Secondary | ICD-10-CM

## 2021-12-12 DIAGNOSIS — Z5112 Encounter for antineoplastic immunotherapy: Secondary | ICD-10-CM | POA: Diagnosis not present

## 2021-12-12 DIAGNOSIS — E119 Type 2 diabetes mellitus without complications: Secondary | ICD-10-CM | POA: Diagnosis not present

## 2021-12-12 DIAGNOSIS — I959 Hypotension, unspecified: Secondary | ICD-10-CM | POA: Diagnosis not present

## 2021-12-12 DIAGNOSIS — D649 Anemia, unspecified: Secondary | ICD-10-CM | POA: Diagnosis not present

## 2021-12-12 LAB — COMPREHENSIVE METABOLIC PANEL
ALT: 13 U/L (ref 0–44)
AST: 23 U/L (ref 15–41)
Albumin: 3.8 g/dL (ref 3.5–5.0)
Alkaline Phosphatase: 70 U/L (ref 38–126)
Anion gap: 5 (ref 5–15)
BUN: 21 mg/dL (ref 8–23)
CO2: 25 mmol/L (ref 22–32)
Calcium: 9.4 mg/dL (ref 8.9–10.3)
Chloride: 106 mmol/L (ref 98–111)
Creatinine, Ser: 1.11 mg/dL — ABNORMAL HIGH (ref 0.44–1.00)
GFR, Estimated: 49 mL/min — ABNORMAL LOW (ref >60)
Glucose, Bld: 176 mg/dL — ABNORMAL HIGH (ref 70–99)
Potassium: 3.4 mmol/L — ABNORMAL LOW (ref 3.5–5.1)
Sodium: 136 mmol/L (ref 135–145)
Total Bilirubin: 0.5 mg/dL (ref 0.3–1.2)
Total Protein: 6.9 g/dL (ref 6.5–8.1)

## 2021-12-12 LAB — CBC WITH DIFFERENTIAL/PLATELET
Abs Immature Granulocytes: 0.03 10*3/uL (ref 0.00–0.07)
Basophils Absolute: 0.1 10*3/uL (ref 0.0–0.1)
Basophils Relative: 1 %
Eosinophils Absolute: 0 10*3/uL (ref 0.0–0.5)
Eosinophils Relative: 0 %
HCT: 28.7 % — ABNORMAL LOW (ref 36.0–46.0)
Hemoglobin: 9.4 g/dL — ABNORMAL LOW (ref 12.0–15.0)
Immature Granulocytes: 0 %
Lymphocytes Relative: 24 %
Lymphs Abs: 1.7 10*3/uL (ref 0.7–4.0)
MCH: 32.4 pg (ref 26.0–34.0)
MCHC: 32.8 g/dL (ref 30.0–36.0)
MCV: 99 fL (ref 80.0–100.0)
Monocytes Absolute: 0.8 10*3/uL (ref 0.1–1.0)
Monocytes Relative: 11 %
Neutro Abs: 4.6 10*3/uL (ref 1.7–7.7)
Neutrophils Relative %: 64 %
Platelets: 376 10*3/uL (ref 150–400)
RBC: 2.9 MIL/uL — ABNORMAL LOW (ref 3.87–5.11)
RDW: 14.2 % (ref 11.5–15.5)
WBC: 7.2 10*3/uL (ref 4.0–10.5)
nRBC: 0 % (ref 0.0–0.2)

## 2021-12-12 MED ORDER — DEXAMETHASONE 4 MG PO TABS
20.0000 mg | ORAL_TABLET | Freq: Once | ORAL | Status: AC
  Administered 2021-12-12: 20 mg via ORAL
  Filled 2021-12-12: qty 5

## 2021-12-12 MED ORDER — ACETAMINOPHEN 325 MG PO TABS
650.0000 mg | ORAL_TABLET | Freq: Once | ORAL | Status: AC
  Administered 2021-12-12: 650 mg via ORAL
  Filled 2021-12-12: qty 2

## 2021-12-12 MED ORDER — CARVEDILOL 3.125 MG PO TABS: 3.1250 mg | ORAL_TABLET | Freq: Two times a day (BID) | ORAL | 1 refills | Status: DC

## 2021-12-12 MED ORDER — DIPHENHYDRAMINE HCL 25 MG PO CAPS
50.0000 mg | ORAL_CAPSULE | Freq: Once | ORAL | Status: AC
  Administered 2021-12-12: 50 mg via ORAL
  Filled 2021-12-12: qty 2

## 2021-12-12 MED ORDER — DARATUMUMAB-HYALURONIDASE-FIHJ 1800-30000 MG-UT/15ML ~~LOC~~ SOLN
1800.0000 mg | Freq: Once | SUBCUTANEOUS | Status: AC
  Administered 2021-12-12: 1800 mg via SUBCUTANEOUS
  Filled 2021-12-12: qty 15

## 2021-12-12 NOTE — Patient Instructions (Addendum)
[facility] [place]  Discharge Instructions: Thank you for choosing [facility] to provide your oncology and hematology care.  If you have a lab appointment with the Cancer Center, please go directly to the Cancer Center and check in at the registration area.  Wear comfortable clothing and clothing appropriate for easy access to any Portacath or PICC line.   We strive to give you quality time with your provider. You may need to reschedule your appointment if you arrive late (15 or more minutes).  Arriving late affects you and other patients whose appointments are after yours.  Also, if you miss three or more appointments without notifying the office, you may be dismissed from the clinic at the provider's discretion.      For prescription refill requests, have your pharmacy contact our office and allow 72 hours for refills to be completed.    Today you received the following chemotherapy and/or immunotherapy agents Darzalex      To help prevent nausea and vomiting after your treatment, we encourage you to take your nausea medication as directed.  BELOW ARE SYMPTOMS THAT SHOULD BE REPORTED IMMEDIATELY: *FEVER GREATER THAN 100.4 F (38 C) OR HIGHER *CHILLS OR SWEATING *NAUSEA AND VOMITING THAT IS NOT CONTROLLED WITH YOUR NAUSEA MEDICATION *UNUSUAL SHORTNESS OF BREATH *UNUSUAL BRUISING OR BLEEDING *URINARY PROBLEMS (pain or burning when urinating, or frequent urination) *BOWEL PROBLEMS (unusual diarrhea, constipation, pain near the anus) TENDERNESS IN MOUTH AND THROAT WITH OR WITHOUT PRESENCE OF ULCERS (sore throat, sores in mouth, or a toothache) UNUSUAL RASH, SWELLING OR PAIN  UNUSUAL VAGINAL DISCHARGE OR ITCHING   Items with * indicate a potential emergency and should be followed up as soon as possible or go to the Emergency Department if any problems should occur.  Please show the CHEMOTHERAPY ALERT CARD or IMMUNOTHERAPY ALERT CARD at check-in to  the Emergency Department and triage nurse.  Should you have questions after your visit or need to cancel or reschedule your appointment, please contact [facility] [place]  [phone] and follow the prompts.  Office hours are 8:00 a.m. to 4:30 p.m. Monday - Friday. Please note that voicemails left after 4:00 p.m. may not be returned until the following business day.  We are closed weekends and major holidays. You have access to a nurse at all times for urgent questions. Please call the main number to the clinic [phone] and follow the prompts.  For any non-urgent questions, you may also contact your provider using MyChart. We now offer e-Visits for anyone 18 and older to request care online for non-urgent symptoms. For details visit [email or web address].   Also download the MyChart app! Go to the app store, search "MyChart", open the app, select [facility], and log in with your MyChart username and password.  Masks are optional in the cancer centers. If you would like for your care team to wear a mask while they are taking care of you, please let them know. For doctor visits, patients may have with them one support person who is at least 85 years old. At this time, visitors are not allowed in the infusion area.   

## 2021-12-12 NOTE — Progress Notes (Signed)
Per Brahmanday, no zometa needed today.

## 2021-12-12 NOTE — Assessment & Plan Note (Addendum)
#  STAGE I- MULTIPLE MYELOMA -Active [bone lesions; hypercalcemia; anemia; No renal insuffiencey;Bone marrow-32% plasma cell-   STANDARD Cytogenetics].  Currently on Revlimid-Dex- Dara SQ. Partial response noted; AUG 2023-M protein = 0.2 gm/fl; K/L= 1. 5.  STABLE.  # Proceed with Dara SQ today; Labs today reviewed;  acceptable for treatment today. On Revlimid at 10 mg dose 3 weeks on 1 week off Decrease dex to 4 mg weekly [reflux like symtoms].   STABLE  #Reflux-like symptoms- ?  Secondary to dexamethasone.  Decreasing the dose to 4 mg daily continue PPI.  # Diarrhea- improved on Revlimid 10 mg a day- 3 weeks-ON & 1 week OFF.  STABLE.  # Diabetes-on oral hypoglycemic agents; on long acting insulin; 14 units of lantus on T/W/Thursday- Defer to PCP.PBF- [id number]  Again recommend inform PCP regarding blood sugars log.STABLE  # HTN: continue losartan; continue coreg; BP- 150-160- refilled coreg; STABLE  #Hypercalcemia secondary to multiple myeloma- [FEB 2023-Zometa ; calcitonin]-calcium today is 9.6. Zometa q 4W.   # Weight loss-s/p re-veluation with [place] -STABLE  #DVT/shingles prophylaxis: Aspirin/acyclovir.STABLE  # Vaccinations; Ok with flu/COVID shot  # IV access:PIV   [name]- [phone]  zometa-q 4w- ; MM; K/L q 4 W  # DISPOSITION:  # proceed with  dara SQ;  # follow up in 4 weeks/Friday  MD; labs- cbc/cmp MM panel;K/L light chains; Zometa - ;Dara SQ;-;Dr.B

## 2021-12-12 NOTE — Progress Notes (Signed)
[place] NOTE  Patient Care Team: Juline Patch, MD as PCP - General (Family Medicine) Vladimir Faster, [facility] (Inactive) (Pharmacist) Cammie Sickle, MD as Consulting Physician (Oncology)  CHIEF COMPLAINTS/PURPOSE OF CONSULTATION: Multiple myeloma  Oncology History Overview Note  # MULTIPLE MYELOMA  [FEB 2023-hypercalcemia status changes]-Active [bone lesions; hypercalcemia; anemia; No renal insuffiencey;Bone marrow-32% plasma cell-   STANDARD Cytogenetics].FEB 2023- S/p dexamethasone 20 mg q day x4.   # REV [15 mg -3 week-On and 1 week-OFF]-DARA; May 2023-discontinued rev 15 diarrhea/hypotension  # MAY 2023- dara-Rev 10 mg 3w/1w   # FEB 2023-hypercalcemia [[facility]-status post calcitonin; bisphosphonate]  BONE MARROW, ASPIRATE, CLOT, CORE:  -Hypercellular bone marrow with plasma cell neoplasm  -See comment   PERIPHERAL BLOOD:  -Normocytic-normochromic anemia   COMMENT:   The bone marrow is hypercellular for age with increased number of  atypical plasma cells representing 32% of all cells in the aspirate  associated with interstitial infiltrates and numerous variably sized  clusters in the clot/biopsy sections.  The plasma cells display weak  kappa light chain restriction consistent with plasma cell neoplasm.  Correlation with cytogenetic and FISH studies is recommended   MICROSCOPIC DESCRIPTION:   PERIPHERAL BLOOD SMEAR: The red blood cells display mild  anisopoikilocytosis with mild polychromasia.  The white blood cells are  normal number with scattered hypogranular neutrophils. An occasional  myelocyte and large atypical mononuclear cells are seen on scan. The  platelets are normal in number.    Multiple myeloma not having achieved remission ([place])  05/15/2021 Initial Diagnosis   Multiple myeloma not having achieved remission ([place])   06/27/2021 - 10/17/2021 Chemotherapy   Patient is on Treatment Plan : MYELOMA Daratumumab SQ + Lenalidomide +  Dexamethasone (DaraRd) q28d     06/27/2021 -  Chemotherapy   Patient is on Treatment Plan : MYELOMA RELAPSED REFRACTORY Daratumumab SQ + Lenalidomide + Dexamethasone (DaraRd) q28d     07/11/2021 Cancer Staging   Staging form: Plasma Cell Myeloma and Plasma Cell Disorders, AJCC 8th Edition - Clinical: Beta-2-microglobulin (mg/L): 2.6, Albumin (g/dL): 3.9, ISS: Stage I - Signed by Cammie Sickle, MD on 07/11/2021 Stage prefix: Initial diagnosis Beta 2 microglobulin range (mg/L): Less than 3.5 Albumin range (g/dL): Greater than or equal to 3.5     HISTORY OF PRESENTING ILLNESS: pt in a wheel chair; accompanied by her daughter.   Cristina Morgan 87 y.o.  female with  multiple myeloma currently on Rev-Dex is proceed with Dara SQ today.  Patient continues to be on 10 mg of Revlimid 3 weeks on 1 week off.    Patient complains of reflux-like symptoms.  Denies any nausea vomiting.  Denies any blood in stools or black-colored stools.   Blood pressures are better controlled.  Blood sugars are also well controlled.  Weight is stable.  Patient states her back pain is improved.  Denies any worsening joint pains.  No falls.  No further episodes of mental status changes.  No diarrhea.   Review of Systems  Constitutional:  Positive for malaise/fatigue and weight loss. Negative for chills, diaphoresis and fever.  HENT:  Negative for nosebleeds and sore throat.   Eyes:  Negative for double vision.  Respiratory:  Negative for cough, hemoptysis, sputum production, shortness of breath and wheezing.   Cardiovascular:  Negative for chest pain, palpitations, orthopnea and leg swelling.  Gastrointestinal:  Negative for abdominal pain, blood in stool, constipation, diarrhea, heartburn, melena, nausea and vomiting.  Genitourinary:  Negative for  dysuria, frequency and urgency.  Musculoskeletal:  Positive for back pain and joint pain.  Skin: Negative.  Negative for itching and rash.  Neurological:   Negative for dizziness, tingling, focal weakness, weakness and headaches.  Endo/Heme/Allergies:  Does not bruise/bleed easily.  Psychiatric/Behavioral:  Negative for depression. The patient is not nervous/anxious and does not have insomnia.      MEDICAL HISTORY:  Past Medical History:  Diagnosis Date   Anemia    CAD (coronary artery disease)    Cataracts, bilateral    Closed compression fracture of first lumbar vertebra (HCC)    Closed compression fracture of second lumbar vertebra (HCC)    Diabetes mellitus without complication (HCC)    High cholesterol    History of pneumonia 2010   [place]   History of uterine fibroid    Hypercalcemia    Hyperlipidemia    Hypertension    Lytic bone lesions on xray    Multiple myeloma (HCC)    Osteoporosis    Primary osteoarthritis of right knee     SURGICAL HISTORY: Past Surgical History:  Procedure Laterality Date   CATARACT EXTRACTION Bilateral 2014   COLONOSCOPY  2011   cleared for 5 yrs- [facility]   ECTOPIC PREGNANCY SURGERY      SOCIAL HISTORY: Social History   Socioeconomic History   Marital status: Married    Spouse name: Not on file   Number of children: 2   Years of education: Not on file   Highest education level: 12th grade  Occupational History   Occupation: Retired  Tobacco Use   Smoking status: Never    Passive exposure: Never   Smokeless tobacco: Never   Tobacco comments:    smoking cessation materials not required  Vaping Use   Vaping Use: Never used  Substance and Sexual Activity   Alcohol use: No   Drug use: No   Sexual activity: Not Currently  Other Topics Concern   Not on file  Social History Narrative   Sims with husband; drives. Never smoked; no alcohol. Daughters live close.    Social Determinants of Health   Financial Resource Strain: Low Risk  (11/13/2021)   Overall Financial Resource Strain (CARDIA)    Difficulty of Paying Living Expenses: Not hard at all  Food Insecurity: No  Food Insecurity (11/13/2021)   Hunger Vital Sign    Worried About Running Out of Food in the Last Year: Never true    Ran Out of Food in the Last Year: Never true  Transportation Needs: No Transportation Needs (11/13/2021)   PRAPARE - Hydrologist (Medical): No    Lack of Transportation (Non-Medical): No  Physical Activity: Sufficiently Active (11/13/2021)   Exercise Vital Sign    Days of Exercise per Week: 7 days    Minutes of Exercise per Session: 30 min  Stress: No Stress Concern Present (11/13/2021)   [place]    Feeling of Stress : Not at all  Social Connections: Moderately Integrated (11/13/2021)   Social Connection and Isolation Panel [NHANES]    Frequency of Communication with Friends and Family: Twice a week    Frequency of Social Gatherings with Friends and Family: More than three times a week    Attends Religious Services: 1 to 4 times per year    Active Member of [employer] or Organizations: No    Attends Archivist Meetings: Never    Marital Status: Married  Intimate Partner Violence: Not At Risk (11/13/2021)   Humiliation, Afraid, Rape, and Kick questionnaire    Fear of Current or Ex-Partner: No    Emotionally Abused: No    Physically Abused: No    Sexually Abused: No    FAMILY HISTORY: Family History  Problem Relation Age of Onset   Cancer Mother        breast   Heart disease Mother    Diabetes Father    Heart disease Father    Heart attack Father    Heart attack Brother    Diabetes Brother     ALLERGIES:  is allergic to latex and penicillins.  MEDICATIONS:  Current Outpatient Medications  Medication Sig Dispense Refill   ACCU-CHEK GUIDE test strip USE 1 STRIP TO CHECK GLUCOSE ONCE DAILY AS DIRECTED 100 each 0   Accu-Chek Softclix Lancets lancets USE 1  TO CHECK GLUCOSE ONCE DAILY 100 each 0   acetaminophen (TYLENOL) 325 MG tablet Take 325 mg by mouth 2  (two) times daily. START 2 days prior to chemo infusion. Take For 2 days; Do NOT take on the day of infusion     acyclovir (ZOVIRAX) 400 MG tablet TAKE ONE TABLET BY MOUTH TWICE DAILY 60 tablet 4   BD PEN NEEDLE NANO 2ND GEN 32G X 4 MM MISC USE 1 ONCE DAILY     Blood Glucose Monitoring Suppl (ACCU-CHEK GUIDE) w/Device KIT USE AS DIRECTED TO CHECK GLUCOSE     Calcium Carbonate-Vit D-Min (CALCIUM 1200 PO) Take 1 capsule by mouth daily.     cholecalciferol (VITAMIN D3) 25 MCG (1000 UNIT) tablet Take 1,000 Units by mouth daily.     dexamethasone (DECADRON) 4 MG tablet Take 1 tablet (4 mg total) by mouth 2 (two) times daily. Start 2 days prior to infusion; Take it for 2 days. 60 tablet 3   diphenoxylate-atropine (LOMOTIL) 2.5-0.025 MG tablet Take 1 tablet by mouth 4 (four) times daily as needed for diarrhea or loose stools. Take it along with immodium 60 tablet 0   Glucerna (GLUCERNA) LIQD Take 237 mLs by mouth.     hydrOXYzine (ATARAX) 10 MG tablet Take 1 tablet (10 mg total) by mouth at bedtime as needed. 90 tablet 1   insulin glargine (LANTUS SOLOSTAR) 100 UNIT/ML Solostar Pen Inject 14 Units into the skin daily. 12 units every day except Wed and Thursday- 14 units 3 mL 0   lenalidomide (REVLIMID) 10 MG capsule Take 1 capsule (10 mg total) by mouth daily. Take for 21 days, then hold for 7 days. Repeat every 28 days. 21 capsule 0   losartan (COZAAR) 25 MG tablet TAKE ONE TABLET BY MOUTH EVERY EVENING 90 tablet 0   lovastatin (MEVACOR) 40 MG tablet Take 1 tablet (40 mg total) by mouth every evening. 90 tablet 1   metFORMIN (GLUCOPHAGE) 1000 MG tablet TAKE ONE TABLET BY MOUTH TWICE DAILY 180 tablet 0   montelukast (SINGULAIR) 10 MG tablet Take ONE tablet by MOUTH daily. START TWO DAYS prior TO chemo infusion. Take FOR TWO DAYS; DO NOT take ON THE DAY of infusion. 20 tablet 1   ondansetron (ZOFRAN) 4 MG tablet Take 1 tablet (4 mg total) by mouth every 6 (six) hours as needed for nausea. 30 tablet 1    pantoprazole (PROTONIX) 40 MG tablet Take 1 tablet (40 mg total) by mouth daily. 90 tablet 1   sertraline (ZOLOFT) 25 MG tablet TAKE ONE TABLET BY MOUTH ONCE DAILY 90 tablet 1   carvedilol (  COREG) 3.125 MG tablet Take 1 tablet (3.125 mg total) by mouth 2 (two) times daily with a meal. 60 tablet 1   No current facility-administered medications for this visit.      Marland Kitchen  PHYSICAL EXAMINATION:  Vitals:   12/12/21 1100  BP: (!) 166/66  Pulse: 67  Resp: 16  Temp: 97.7 F (36.5 C)   Filed Weights   12/12/21 1100  Weight: 127 lb 3.2 oz (57.7 kg)    Physical Exam Vitals and nursing note reviewed.  HENT:     Head: Normocephalic and atraumatic.     Mouth/Throat:     Pharynx: Oropharynx is clear.  Eyes:     Extraocular Movements: Extraocular movements intact.     Pupils: Pupils are equal, round, and reactive to light.  Cardiovascular:     Rate and Rhythm: Normal rate and regular rhythm.  Pulmonary:     Comments: Decreased breath sounds bilaterally.  Abdominal:     Palpations: Abdomen is soft.  Musculoskeletal:        General: Normal range of motion.     Cervical back: Normal range of motion.  Skin:    General: Skin is warm.  Neurological:     General: No focal deficit present.     Mental Status: She is alert and oriented to person, place, and time.  Psychiatric:        Behavior: Behavior normal.        Judgment: Judgment normal.      LABORATORY DATA:  I have reviewed the data as listed Lab Results  Component Value Date   WBC 7.2 12/12/2021   HGB 9.4 (L) 12/12/2021   HCT 28.7 (L) 12/12/2021   MCV 99.0 12/12/2021   PLT 376 12/12/2021   Recent Labs    02/01/21 1502 02/18/21 1615 04/15/21 1549 05/01/21 0320 11/14/21 1006 11/28/21 1018 12/12/21 1119  NA 138  --   --    < > 135 137 136  K 4.3  --   --    < > 4.5 4.1 3.4*  CL 101  --   --    < > 103 107 106  CO2 23  --   --    < > _0 GLUCOSE 178*  --   --    < > 186* 174* 176*  BUN 19  --   --    < >  _1 CREATININE 0.84  --   --    < > 1.09* 0.91 1.11*  CALCIUM 10.7*   < >  --    < > 9.2 9.3 9.4  GFRNONAA  --   --   --    < > 50* >60 49*  PROT  --   --   --    < > 6.6 6.3* 6.9  ALBUMIN 4.3  --  4.4   < > 3.9 3.6 3.8  AST 16  --  17   < > _2 ALT 9  --  8   < > _3 ALKPHOS 102  --  108   < > 65 64 70  BILITOT 0.3  --  0.3   < > 0.6 0.5 0.5  BILIDIR 0.12  --  0.10  --   --   --   --    < > = values in this interval not displayed.    RADIOGRAPHIC STUDIES: I have personally reviewed the radiological images as  listed and agreed with the findings in the report. No results found.  Multiple myeloma not having achieved remission ([place]) # STAGE I- MULTIPLE MYELOMA -Active [bone lesions; hypercalcemia; anemia; No renal insuffiencey;Bone marrow-32% plasma cell-   STANDARD Cytogenetics].  Currently on Revlimid-Dex- Dara SQ. Partial response noted; AUG 2023-M protein = 0.2 gm/fl; K/L= 1. 5.  STABLE.  # Proceed with Dara SQ today; Labs today reviewed;  acceptable for treatment today. On Revlimid at 10 mg dose 3 weeks on 1 week off Decrease dex to 4 mg weekly [reflux like symtoms].   STABLE  #Reflux-like symptoms- ?  Secondary to dexamethasone.  Decreasing the dose to 4 mg daily continue PPI.  # Diarrhea- improved on Revlimid 10 mg a day- 3 weeks-ON & 1 week OFF.  STABLE.  # Diabetes-on oral hypoglycemic agents; on long acting insulin; 14 units of lantus on T/W/Thursday- Defer to PCP.PBF- [id number]  Again recommend inform PCP regarding blood sugars log.STABLE  # HTN: continue losartan; continue coreg; BP- 150-160- refilled coreg; STABLE  #Hypercalcemia secondary to multiple myeloma- [FEB 2023-Zometa ; calcitonin]-calcium today is 9.6. Zometa q 4W.   # Weight loss-s/p re-veluation with [place] -STABLE  #DVT/shingles prophylaxis: Aspirin/acyclovir.STABLE  # Vaccinations; Ok with flu/COVID shot  # IV access:PIV   [name]- [phone]  zometa-q 4w- ; MM; K/L q 4 W  #  DISPOSITION:  # proceed with  dara SQ;  # follow up in 4 weeks/Friday  MD; labs- cbc/cmp MM panel;K/L light chains; Zometa - ;Dara SQ;-;Dr.B      All questions were answered. The patient knows to call the clinic with any problems, questions or concerns.       Cammie Sickle, MD 12/13/2021 7:41 AM

## 2021-12-12 NOTE — Progress Notes (Signed)
Patient has not taken Carvedilol since February.  Does she need to resume?  Increase in acid reflux for the last 2-3 weeks.

## 2021-12-13 ENCOUNTER — Encounter: Payer: Self-pay | Admitting: Internal Medicine

## 2021-12-13 LAB — KAPPA/LAMBDA LIGHT CHAINS
Kappa free light chain: 19.3 mg/L (ref 3.3–19.4)
Kappa, lambda light chain ratio: 1.51 (ref 0.26–1.65)
Lambda free light chains: 12.8 mg/L (ref 5.7–26.3)

## 2021-12-17 LAB — MULTIPLE MYELOMA PANEL, SERUM
Albumin SerPl Elph-Mcnc: 3.8 g/dL (ref 2.9–4.4)
Albumin/Glob SerPl: 1.6 (ref 0.7–1.7)
Alpha 1: 0.2 g/dL (ref 0.0–0.4)
Alpha2 Glob SerPl Elph-Mcnc: 0.7 g/dL (ref 0.4–1.0)
B-Globulin SerPl Elph-Mcnc: 0.9 g/dL (ref 0.7–1.3)
Gamma Glob SerPl Elph-Mcnc: 0.6 g/dL (ref 0.4–1.8)
Globulin, Total: 2.4 g/dL (ref 2.2–3.9)
IgA: 78 mg/dL (ref 64–422)
IgG (Immunoglobin G), Serum: 639 mg/dL (ref 586–1602)
IgM (Immunoglobulin M), Srm: 23 mg/dL — ABNORMAL LOW (ref 26–217)
M Protein SerPl Elph-Mcnc: 0.2 g/dL — ABNORMAL HIGH
Total Protein ELP: 6.2 g/dL (ref 6.0–8.5)

## 2021-12-23 ENCOUNTER — Other Ambulatory Visit: Payer: Self-pay | Admitting: Pharmacist

## 2021-12-23 DIAGNOSIS — C9000 Multiple myeloma not having achieved remission: Secondary | ICD-10-CM

## 2021-12-23 MED ORDER — LENALIDOMIDE 10 MG PO CAPS: 10.0000 mg | ORAL_CAPSULE | Freq: Every day | ORAL | 0 refills | Status: DC

## 2021-12-30 ENCOUNTER — Ambulatory Visit (INDEPENDENT_AMBULATORY_CARE_PROVIDER_SITE_OTHER): Payer: Medicare Other | Admitting: Family Medicine

## 2021-12-30 ENCOUNTER — Encounter: Payer: Self-pay | Admitting: Family Medicine

## 2021-12-30 VITALS — BP 128/60 | HR 68 | Ht 66.0 in | Wt 134.0 lb

## 2021-12-30 DIAGNOSIS — E119 Type 2 diabetes mellitus without complications: Secondary | ICD-10-CM

## 2021-12-30 DIAGNOSIS — E7800 Pure hypercholesterolemia, unspecified: Secondary | ICD-10-CM

## 2021-12-30 DIAGNOSIS — Z23 Encounter for immunization: Secondary | ICD-10-CM | POA: Diagnosis not present

## 2021-12-30 DIAGNOSIS — Z794 Long term (current) use of insulin: Secondary | ICD-10-CM

## 2021-12-30 DIAGNOSIS — I10 Essential (primary) hypertension: Secondary | ICD-10-CM

## 2021-12-30 MED ORDER — LOVASTATIN 40 MG PO TABS: 40.0000 mg | ORAL_TABLET | Freq: Every evening | ORAL | 1 refills | Status: DC

## 2021-12-30 MED ORDER — LANTUS SOLOSTAR 100 UNIT/ML ~~LOC~~ SOPN: 10.0000 [IU] | PEN_INJECTOR | Freq: Every day | SUBCUTANEOUS | 1 refills | Status: DC

## 2021-12-30 MED ORDER — METFORMIN HCL 1000 MG PO TABS: 1000.0000 mg | ORAL_TABLET | Freq: Two times a day (BID) | ORAL | 1 refills | Status: DC

## 2021-12-30 NOTE — Progress Notes (Signed)
Date:  12/30/2021   Name:  Cristina Morgan   DOB:  1934-10-30   MRN:  [id number]   Chief Complaint: Diabetes  Diabetes She presents for her follow-up diabetic visit. She has type 2 diabetes mellitus. Her disease course has been stable. Pertinent negatives for hypoglycemia include no confusion, dizziness, headaches or nervousness/anxiousness. There are no diabetic associated symptoms. Pertinent negatives for diabetes include no chest pain, no fatigue, no polydipsia, no polyuria, no weakness and no weight loss. There are no hypoglycemic complications. Symptoms are stable. There are no diabetic complications. There are no known risk factors for coronary artery disease. Current diabetic treatment includes insulin injections and oral agent (monotherapy). She is compliant with treatment all of the time. Her breakfast blood glucose is taken between 8-9 am. Her breakfast blood glucose range is generally 130-140 mg/dl.  Hypertension This is a chronic problem. The current episode started more than 1 year ago. The problem has been gradually improving since onset. The problem is controlled. Pertinent negatives include no chest pain, headaches, orthopnea, palpitations, PND or shortness of breath. There are no associated agents to hypertension. There are no known risk factors for coronary artery disease. Past treatments include angiotensin blockers, beta blockers and alpha 1 blockers. The current treatment provides moderate improvement. There is no history of chronic renal disease, a hypertension causing med or renovascular disease.  Hyperlipidemia She has no history of chronic renal disease. Pertinent negatives include no chest pain, myalgias or shortness of breath. Current antihyperlipidemic treatment includes statins.    Lab Results  Component Value Date   NA 136 12/12/2021   K 3.4 (L) 12/12/2021   CO2 25 12/12/2021   GLUCOSE 176 (H) 12/12/2021   BUN 21 12/12/2021   CREATININE 1.11 (H) 12/12/2021    CALCIUM 9.4 12/12/2021   EGFR 68 02/01/2021   GFRNONAA 49 (L) 12/12/2021   Lab Results  Component Value Date   CHOL 176 07/24/2020   HDL 74 07/24/2020   LDLCALC 90 07/24/2020   TRIG 63 07/24/2020   CHOLHDL 2.7 03/01/2018   Lab Results  Component Value Date   TSH 0.367 05/03/2021   Lab Results  Component Value Date   HGBA1C 8.0 (H) 08/30/2021   Lab Results  Component Value Date   WBC 7.2 12/12/2021   HGB 9.4 (L) 12/12/2021   HCT 28.7 (L) 12/12/2021   MCV 99.0 12/12/2021   PLT 376 12/12/2021   Lab Results  Component Value Date   ALT 13 12/12/2021   AST 23 12/12/2021   ALKPHOS 70 12/12/2021   BILITOT 0.5 12/12/2021   No results found for: "25OHVITD2", "25OHVITD3", "VD25OH"   Review of Systems  Constitutional: Negative.  Negative for chills, fatigue, fever, unexpected weight change and weight loss.  HENT:  Negative for congestion, ear discharge, ear pain, rhinorrhea, sinus pressure, sneezing and sore throat.   Respiratory:  Negative for cough, shortness of breath, wheezing and stridor.   Cardiovascular:  Negative for chest pain, palpitations, orthopnea and PND.  Gastrointestinal:  Negative for abdominal pain, blood in stool, constipation, diarrhea and nausea.  Endocrine: Negative for polydipsia and polyuria.  Genitourinary:  Negative for dysuria, flank pain, frequency, hematuria, urgency and vaginal discharge.  Musculoskeletal:  Negative for arthralgias, back pain and myalgias.  Skin:  Negative for rash.  Neurological:  Negative for dizziness, weakness and headaches.  Hematological:  Negative for adenopathy. Does not bruise/bleed easily.  Psychiatric/Behavioral:  Negative for confusion and dysphoric mood. The patient is not nervous/anxious.  Patient Active Problem List   Diagnosis Date Noted   Multiple myeloma not having achieved remission ([place]) 05/15/2021   Pressure injury of coccygeal region, stage 2 ([place]) 05/07/2021   Hyponatremia 05/04/2021   Pressure  injury of skin 05/02/2021   Vitamin B12 deficiency 05/02/2021   Hypomagnesemia 05/02/2021   Hypophosphatemia 05/02/2021   SVT (supraventricular tachycardia) 05/02/2021   Altered mental status 06/05/2246   Acute metabolic encephalopathy 25/00/3704   Hypercalcemia 05/01/2021   Closed compression fracture of second lumbar vertebra ([place]) 10/28/2020   Need for vaccination against Streptococcus pneumoniae using pneumococcal conjugate vaccine 13 01/07/2017   Primary osteoarthritis of right knee 12/08/2016   PNA (pneumonia) 07/09/2015   Acute chest pain 03/20/2014   Type 2 diabetes mellitus ([place]) 03/20/2014   Hypercholesterolemia 03/20/2014   Essential hypertension 03/20/2014    Allergies  Allergen Reactions   Latex Rash   Penicillins Other (See Comments)    Past Surgical History:  Procedure Laterality Date   CATARACT EXTRACTION Bilateral 2014   COLONOSCOPY  2011   cleared for 5 yrs- [facility]   ECTOPIC PREGNANCY SURGERY      Social History   Tobacco Use   Smoking status: Never    Passive exposure: Never   Smokeless tobacco: Never   Tobacco comments:    smoking cessation materials not required  Vaping Use   Vaping Use: Never used  Substance Use Topics   Alcohol use: No   Drug use: No     Medication list has been reviewed and updated.  Current Meds  Medication Sig   ACCU-CHEK GUIDE test strip USE 1 STRIP TO CHECK GLUCOSE ONCE DAILY AS DIRECTED   Accu-Chek Softclix Lancets lancets USE 1  TO CHECK GLUCOSE ONCE DAILY   acetaminophen (TYLENOL) 325 MG tablet Take 325 mg by mouth 2 (two) times daily. START 2 days prior to chemo infusion. Take For 2 days; Do NOT take on the day of infusion   acyclovir (ZOVIRAX) 400 MG tablet TAKE ONE TABLET BY MOUTH TWICE DAILY   BD PEN NEEDLE NANO 2ND GEN 32G X 4 MM MISC USE 1 ONCE DAILY   Blood Glucose Monitoring Suppl (ACCU-CHEK GUIDE) w/Device KIT USE AS DIRECTED TO CHECK GLUCOSE   Calcium Carbonate-Vit D-Min (CALCIUM 1200 PO) Take 1 capsule  by mouth daily.   carvedilol (COREG) 3.125 MG tablet Take 1 tablet (3.125 mg total) by mouth 2 (two) times daily with a meal.   cholecalciferol (VITAMIN D3) 25 MCG (1000 UNIT) tablet Take 1,000 Units by mouth daily.   dexamethasone (DECADRON) 4 MG tablet Take 1 tablet (4 mg total) by mouth 2 (two) times daily. Start 2 days prior to infusion; Take it for 2 days.   diphenoxylate-atropine (LOMOTIL) 2.5-0.025 MG tablet Take 1 tablet by mouth 4 (four) times daily as needed for diarrhea or loose stools. Take it along with immodium   Glucerna (GLUCERNA) LIQD Take 237 mLs by mouth.   hydrOXYzine (ATARAX) 10 MG tablet Take 1 tablet (10 mg total) by mouth at bedtime as needed.   insulin glargine (LANTUS SOLOSTAR) 100 UNIT/ML Solostar Pen Inject 14 Units into the skin daily. 12 units every day except Wed and Thursday- 14 units (Patient taking differently: Inject 10 Units into the skin daily. 8 units every day except Wed and Thursday- 10 units)   lenalidomide (REVLIMID) 10 MG capsule Take 1 capsule (10 mg total) by mouth daily. Take for 21 days, then hold for 7 days. Repeat every 28 days.   losartan (COZAAR)  25 MG tablet TAKE ONE TABLET BY MOUTH EVERY EVENING   lovastatin (MEVACOR) 40 MG tablet Take 1 tablet (40 mg total) by mouth every evening.   metFORMIN (GLUCOPHAGE) 1000 MG tablet TAKE ONE TABLET BY MOUTH TWICE DAILY   montelukast (SINGULAIR) 10 MG tablet Take ONE tablet by MOUTH daily. START TWO DAYS prior TO chemo infusion. Take FOR TWO DAYS; DO NOT take ON THE DAY of infusion.   ondansetron (ZOFRAN) 4 MG tablet Take 1 tablet (4 mg total) by mouth every 6 (six) hours as needed for nausea.   pantoprazole (PROTONIX) 40 MG tablet Take 1 tablet (40 mg total) by mouth daily.   sertraline (ZOLOFT) 25 MG tablet TAKE ONE TABLET BY MOUTH ONCE DAILY       12/30/2021    2:39 PM 08/30/2021    2:10 PM 07/01/2021    3:04 PM 05/17/2021    1:46 PM  GAD 7 : Generalized Anxiety Score  Nervous, Anxious, on Edge 0 0 0 0   Control/stop worrying 0 0 0 1  Worry too much - different things 0 0 0 1  Trouble relaxing 0 1 0 0  Restless 0 0 0 0  Easily annoyed or irritable 0 0 0 0  Afraid - awful might happen 0 0 0 0  Total GAD 7 Score 0 1 0 2  Anxiety Difficulty Not difficult at all Not difficult at all Not difficult at all Not difficult at all       12/30/2021    2:38 PM 11/13/2021   11:32 AM 08/30/2021    2:10 PM  Depression screen PHQ 2/9  Decreased Interest 0 0 0  Down, Depressed, Hopeless 0 0 1  PHQ - 2 Score 0 0 1  Altered sleeping 0  1  Tired, decreased energy 0  0  Change in appetite 0  0  Feeling bad or failure about yourself  0  0  Trouble concentrating 0  0  Moving slowly or fidgety/restless 0  0  Suicidal thoughts 0  0  PHQ-9 Score 0  2  Difficult doing work/chores Not difficult at all  Not difficult at all    BP Readings from Last 3 Encounters:  12/30/21 128/60  12/12/21 (!) 166/66  11/28/21 (!) 167/65    Physical Exam Vitals and nursing note reviewed. Exam conducted with a chaperone present.  Constitutional:      General: She is not in acute distress.    Appearance: She is not diaphoretic.  HENT:     Head: Normocephalic and atraumatic.     Right Ear: Tympanic membrane, ear canal and external ear normal.     Left Ear: Tympanic membrane, ear canal and external ear normal.     Nose: Nose normal.     Mouth/Throat:     Mouth: Mucous membranes are moist.  Eyes:     General:        Right eye: No discharge.        Left eye: No discharge.     Conjunctiva/sclera: Conjunctivae normal.     Pupils: Pupils are equal, round, and reactive to light.  Neck:     Thyroid: No thyromegaly.     Vascular: No JVD.  Cardiovascular:     Rate and Rhythm: Normal rate and regular rhythm.     Heart sounds: Normal heart sounds. No murmur heard.    No friction rub. No gallop.  Pulmonary:     Effort: Pulmonary effort is normal.  Breath sounds: Normal breath sounds. No wheezing, rhonchi or rales.   Abdominal:     General: Bowel sounds are normal.     Palpations: Abdomen is soft. There is no mass.     Tenderness: There is no abdominal tenderness. There is no guarding or rebound.  Musculoskeletal:        General: Normal range of motion.     Cervical back: Normal range of motion and neck supple.  Lymphadenopathy:     Cervical: No cervical adenopathy.  Skin:    General: Skin is warm and dry.     Findings: No bruising or erythema.  Neurological:     Mental Status: She is alert.     Motor: No weakness.     Coordination: Coordination normal.     Deep Tendon Reflexes: Reflexes are normal and symmetric.     Wt Readings from Last 3 Encounters:  12/30/21 134 lb (60.8 kg)  12/12/21 127 lb 3.2 oz (57.7 kg)  11/28/21 127 lb 6.4 oz (57.8 kg)    BP 128/60   Pulse 68   Ht '5\' 6"'  (1.676 m)   Wt 134 lb (60.8 kg)   BMI 21.63 kg/m   Assessment and Plan:  1. Type 2 diabetes mellitus without complication, with long-term current use of insulin (HCC) Chronic.  Controlled.  Stable.  Currently is on alternating Lantus 10 units and 8 units depending on whether she takes steroid treatment.  Continue metformin 1 g twice a day. - Urine microalbumin-creatinine with uACR - HgB A1c - insulin glargine (LANTUS SOLOSTAR) 100 UNIT/ML Solostar Pen; Inject 10 Units into the skin daily. 8 units every day except Wed and Thursday- 10 units  Dispense: 15 mL; Refill: 1 - metFORMIN (GLUCOPHAGE) 1000 MG tablet; Take 1 tablet (1,000 mg total) by mouth 2 (two) times daily.  Dispense: 180 tablet; Refill: 1  2. Need for immunization against influenza Discussed and administered - Flu Vaccine QUAD High Dose(Fluad)  3. Hypercholesterolemia Chronic.  Controlled.  Stable.  Continue lovastatin 1 tablet q. evening. - lovastatin (MEVACOR) 40 MG tablet; Take 1 tablet (40 mg total) by mouth every evening.  Dispense: 90 tablet; Refill: 1  4. Essential hypertension Chronic.  Controlled.  Stable.  Excellent at 128/60.   Continue losartan and Coreg as previously prescribed.   Otilio Miu, MD

## 2021-12-30 NOTE — Patient Instructions (Signed)
..  mmc

## 2022-01-08 ENCOUNTER — Telehealth: Payer: Self-pay

## 2022-01-08 NOTE — Telephone Encounter (Signed)
Called Cristina Morgan for micro and A1C to be done this week

## 2022-01-10 ENCOUNTER — Encounter: Payer: Self-pay | Admitting: Internal Medicine

## 2022-01-10 ENCOUNTER — Inpatient Hospital Stay: Payer: Medicare Other

## 2022-01-10 ENCOUNTER — Inpatient Hospital Stay (HOSPITAL_BASED_OUTPATIENT_CLINIC_OR_DEPARTMENT_OTHER): Payer: Medicare Other | Admitting: Internal Medicine

## 2022-01-10 ENCOUNTER — Inpatient Hospital Stay: Payer: Medicare Other | Attending: Nurse Practitioner

## 2022-01-10 VITALS — BP 155/61 | HR 65 | Temp 97.0°F | Resp 17 | Wt 124.0 lb

## 2022-01-10 DIAGNOSIS — E119 Type 2 diabetes mellitus without complications: Secondary | ICD-10-CM | POA: Diagnosis not present

## 2022-01-10 DIAGNOSIS — Z7984 Long term (current) use of oral hypoglycemic drugs: Secondary | ICD-10-CM | POA: Diagnosis not present

## 2022-01-10 DIAGNOSIS — M7989 Other specified soft tissue disorders: Secondary | ICD-10-CM | POA: Diagnosis not present

## 2022-01-10 DIAGNOSIS — Z79899 Other long term (current) drug therapy: Secondary | ICD-10-CM | POA: Insufficient documentation

## 2022-01-10 DIAGNOSIS — R600 Localized edema: Secondary | ICD-10-CM | POA: Insufficient documentation

## 2022-01-10 DIAGNOSIS — Z7952 Long term (current) use of systemic steroids: Secondary | ICD-10-CM | POA: Insufficient documentation

## 2022-01-10 DIAGNOSIS — I2510 Atherosclerotic heart disease of native coronary artery without angina pectoris: Secondary | ICD-10-CM | POA: Insufficient documentation

## 2022-01-10 DIAGNOSIS — R0609 Other forms of dyspnea: Secondary | ICD-10-CM

## 2022-01-10 DIAGNOSIS — C9000 Multiple myeloma not having achieved remission: Secondary | ICD-10-CM

## 2022-01-10 DIAGNOSIS — I10 Essential (primary) hypertension: Secondary | ICD-10-CM | POA: Insufficient documentation

## 2022-01-10 DIAGNOSIS — E7800 Pure hypercholesterolemia, unspecified: Secondary | ICD-10-CM | POA: Diagnosis not present

## 2022-01-10 DIAGNOSIS — Z7961 Long term (current) use of immunomodulator: Secondary | ICD-10-CM | POA: Diagnosis not present

## 2022-01-10 DIAGNOSIS — Z5112 Encounter for antineoplastic immunotherapy: Secondary | ICD-10-CM | POA: Insufficient documentation

## 2022-01-10 DIAGNOSIS — D649 Anemia, unspecified: Secondary | ICD-10-CM | POA: Diagnosis not present

## 2022-01-10 DIAGNOSIS — M549 Dorsalgia, unspecified: Secondary | ICD-10-CM | POA: Insufficient documentation

## 2022-01-10 DIAGNOSIS — Z79624 Long term (current) use of inhibitors of nucleotide synthesis: Secondary | ICD-10-CM | POA: Diagnosis not present

## 2022-01-10 DIAGNOSIS — Z794 Long term (current) use of insulin: Secondary | ICD-10-CM | POA: Diagnosis not present

## 2022-01-10 LAB — COMPREHENSIVE METABOLIC PANEL
ALT: 13 U/L (ref 0–44)
AST: 22 U/L (ref 15–41)
Albumin: 3.8 g/dL (ref 3.5–5.0)
Alkaline Phosphatase: 66 U/L (ref 38–126)
Anion gap: 8 (ref 5–15)
BUN: 20 mg/dL (ref 8–23)
CO2: 24 mmol/L (ref 22–32)
Calcium: 9.4 mg/dL (ref 8.9–10.3)
Chloride: 107 mmol/L (ref 98–111)
Creatinine, Ser: 1.15 mg/dL — ABNORMAL HIGH (ref 0.44–1.00)
GFR, Estimated: 47 mL/min — ABNORMAL LOW (ref >60)
Glucose, Bld: 115 mg/dL — ABNORMAL HIGH (ref 70–99)
Potassium: 3.8 mmol/L (ref 3.5–5.1)
Sodium: 139 mmol/L (ref 135–145)
Total Bilirubin: 0.6 mg/dL (ref 0.3–1.2)
Total Protein: 6.6 g/dL (ref 6.5–8.1)

## 2022-01-10 LAB — CBC WITH DIFFERENTIAL/PLATELET
Abs Immature Granulocytes: 0.01 10*3/uL (ref 0.00–0.07)
Basophils Absolute: 0.1 10*3/uL (ref 0.0–0.1)
Basophils Relative: 2 %
Eosinophils Absolute: 0.2 10*3/uL (ref 0.0–0.5)
Eosinophils Relative: 5 %
HCT: 29.2 % — ABNORMAL LOW (ref 36.0–46.0)
Hemoglobin: 9 g/dL — ABNORMAL LOW (ref 12.0–15.0)
Immature Granulocytes: 0 %
Lymphocytes Relative: 44 %
Lymphs Abs: 2 10*3/uL (ref 0.7–4.0)
MCH: 31.5 pg (ref 26.0–34.0)
MCHC: 30.8 g/dL (ref 30.0–36.0)
MCV: 102.1 fL — ABNORMAL HIGH (ref 80.0–100.0)
Monocytes Absolute: 0.7 10*3/uL (ref 0.1–1.0)
Monocytes Relative: 15 %
Neutro Abs: 1.5 10*3/uL — ABNORMAL LOW (ref 1.7–7.7)
Neutrophils Relative %: 34 %
Platelets: 332 10*3/uL (ref 150–400)
RBC: 2.86 MIL/uL — ABNORMAL LOW (ref 3.87–5.11)
RDW: 14.2 % (ref 11.5–15.5)
WBC: 4.5 10*3/uL (ref 4.0–10.5)
nRBC: 0 % (ref 0.0–0.2)

## 2022-01-10 LAB — BRAIN NATRIURETIC PEPTIDE: B Natriuretic Peptide: 119.7 pg/mL — ABNORMAL HIGH (ref 0.0–100.0)

## 2022-01-10 MED ORDER — SODIUM CHLORIDE 0.9 % IV SOLN
Freq: Once | INTRAVENOUS | Status: AC
  Filled 2022-01-10: qty 250

## 2022-01-10 MED ORDER — DARATUMUMAB-HYALURONIDASE-FIHJ 1800-30000 MG-UT/15ML ~~LOC~~ SOLN
1800.0000 mg | Freq: Once | SUBCUTANEOUS | Status: AC
  Administered 2022-01-10: 1800 mg via SUBCUTANEOUS
  Filled 2022-01-10: qty 15

## 2022-01-10 MED ORDER — ACETAMINOPHEN 325 MG PO TABS
650.0000 mg | ORAL_TABLET | Freq: Once | ORAL | Status: AC
  Administered 2022-01-10: 650 mg via ORAL
  Filled 2022-01-10: qty 2

## 2022-01-10 MED ORDER — DIPHENHYDRAMINE HCL 25 MG PO CAPS
50.0000 mg | ORAL_CAPSULE | Freq: Once | ORAL | Status: AC
  Administered 2022-01-10: 50 mg via ORAL
  Filled 2022-01-10: qty 2

## 2022-01-10 MED ORDER — ZOLEDRONIC ACID 4 MG/5ML IV CONC
3.0000 mg | Freq: Once | INTRAVENOUS | Status: AC
  Administered 2022-01-10: 3 mg via INTRAVENOUS
  Filled 2022-01-10: qty 3.75

## 2022-01-10 MED ORDER — DEXAMETHASONE 4 MG PO TABS
20.0000 mg | ORAL_TABLET | Freq: Once | ORAL | Status: AC
  Administered 2022-01-10: 20 mg via ORAL
  Filled 2022-01-10: qty 5

## 2022-01-10 NOTE — Progress Notes (Signed)
Nutrition Follow-up:  Patient with multiple myeloma, currently on chemotherapy.  Met with patient during infusion.  Patient reports that her appetite has been good and she has felt like doing more things around the house recently.  "I have been cooking some."  Has backed off of drinking glucerna shakes.  Says that for breakfast she likes a sandwich.  Lunch is soup or peanut butter crackers or fruit or jello.  Supper is meat and couple of sides.  Reports some reflux symptoms but otherwise denies nutrition impact symptoms    Medications: reviewed  Labs: reviewed  Anthropometrics:   Weight 124 lb today  10/16 134 lb at PCP office (? Accuracy) 9/28 127 lb   125 lb on 8/3 124 lb on 7/20   NUTRITION DIAGNOSIS: Inadequate oral intake ongoing   INTERVENTION:  Question if patient more active and not stopping to eat.  Patient believes this could be a factor.   Encouraged adding back glucerna shakes between meals Encouraged patient to consume more at lunch time.  We discussed some optoins Discussed snack options as well    MONITORING, EVALUATION, GOAL: weight trends, intake   NEXT VISIT: Friday, Dec 8 phone call  Marquia Costello B. [name], [place], [place] Registered Dietitian [phone]

## 2022-01-10 NOTE — Patient Instructions (Signed)
[facility]  Discharge Instructions: Thank you for choosing [facility] to provide your oncology and hematology care.  If you have a lab appointment with the Cancer Center, please go directly to the Cancer Center and check in at the registration area.  Wear comfortable clothing and clothing appropriate for easy access to any Portacath or PICC line.   We strive to give you quality time with your provider. You may need to reschedule your appointment if you arrive late (15 or more minutes).  Arriving late affects you and other patients whose appointments are after yours.  Also, if you miss three or more appointments without notifying the office, you may be dismissed from the clinic at the provider's discretion.      For prescription refill requests, have your pharmacy contact our office and allow 72 hours for refills to be completed.    Today you received the following chemotherapy and/or immunotherapy agents Darzalex      To help prevent nausea and vomiting after your treatment, we encourage you to take your nausea medication as directed.  BELOW ARE SYMPTOMS THAT SHOULD BE REPORTED IMMEDIATELY: *FEVER GREATER THAN 100.4 F (38 C) OR HIGHER *CHILLS OR SWEATING *NAUSEA AND VOMITING THAT IS NOT CONTROLLED WITH YOUR NAUSEA MEDICATION *UNUSUAL SHORTNESS OF BREATH *UNUSUAL BRUISING OR BLEEDING *URINARY PROBLEMS (pain or burning when urinating, or frequent urination) *BOWEL PROBLEMS (unusual diarrhea, constipation, pain near the anus) TENDERNESS IN MOUTH AND THROAT WITH OR WITHOUT PRESENCE OF ULCERS (sore throat, sores in mouth, or a toothache) UNUSUAL RASH, SWELLING OR PAIN  UNUSUAL VAGINAL DISCHARGE OR ITCHING   Items with * indicate a potential emergency and should be followed up as soon as possible or go to the Emergency Department if any problems should occur.  Please show the CHEMOTHERAPY ALERT CARD or IMMUNOTHERAPY ALERT CARD at check-in to  the Emergency Department and triage nurse.  Should you have questions after your visit or need to cancel or reschedule your appointment, please contact [facility]  [phone] and follow the prompts.  Office hours are 8:00 a.m. to 4:30 p.m. Monday - Friday. Please note that voicemails left after 4:00 p.m. may not be returned until the following business day.  We are closed weekends and major holidays. You have access to a nurse at all times for urgent questions. Please call the main number to the clinic [phone] and follow the prompts.  For any non-urgent questions, you may also contact your provider using MyChart. We now offer e-Visits for anyone 18 and older to request care online for non-urgent symptoms. For details visit [email or web address].   Also download the MyChart app! Go to the app store, search "MyChart", open the app, select [facility], and log in with your MyChart username and password.  Masks are optional in the cancer centers. If you would like for your care team to wear a mask while they are taking care of you, please let them know. For doctor visits, patients may have with them one support person who is at least 85 years old. At this time, visitors are not allowed in the infusion area.   

## 2022-01-10 NOTE — Assessment & Plan Note (Addendum)
#  STAGE I- MULTIPLE MYELOMA -Active [bone lesions; hypercalcemia; anemia; No renal insuffiencey;Bone marrow-32% plasma cell-   STANDARD [employer]].  Currently on Revlimid-Dex- Dara SQ. Partial response noted; SEP 2023-M protein = 0.2 gm/fl; K/L= 1. 5.  STABLE.  # Proceed with Dara SQ today; Labs today reviewed;  acceptable for treatment today. On Revlimid at 10 mg dose 3 weeks on 1 week off Decrease dex to 4 mg weekly [reflux like symtoms].   STABLE.   #Reflux-like symptoms- ?  Secondary to dexamethasone.  Continue low-dose 4 mg weekly continue PPI. STABLE.  # Anemia- Hb 9; JUNE 2023- Iron sat-13; Ferritin- 66. Trial of gentle iron. Consider IV iron.   # Diarrhea- improved on Revlimid 10 mg a day- 3 weeks-ON & 1 week OFF.  STABLE.  # Diabetes-on oral hypoglycemic agents; on long acting insulin; 14 units of lantus on T/W/Thursday- Defer to PCP.PBF- BG-176  Again recommend inform PCP regarding blood sugars log.STABLE  # HTN: continue losartan; continue coreg; BP- 150-160- refilled coreg; STABLE  # Hypercalcemia secondary to multiple myeloma- [FEB 2023-Zometa ; calcitonin]-calcium today is 9.6. Zometa q 4W. STABLE  # Weight loss-s/p re-veluation with Joli -STABLE  # Bil LE swelling- recommend compression stockings; clinically not suggestive of CHF.  Check BNP.   #DVT/shingles prophylaxis: Aspirin/acyclovir.STABLE  # Vaccinations; Ok with flu/COVID shot  # IV access:PIV   [name]- [phone]  zometa-q 4w- ; MM; K/L q 4 W  # DISPOSITION:  # ADD BNP to lab today- ordered.  # proceed with  dara SQ;  # follow up in 5 weeks/Friday  MD; labs- cbc/cmp MM panel;K/L light chains; iron studies; ferritin;BN ; Zometa - ;Dara SQ;possible venofer--;Dr.B

## 2022-01-10 NOTE — Progress Notes (Signed)
Patient here for oncology follow-up appointment, concerns of weight loss and SOB

## 2022-01-10 NOTE — Progress Notes (Signed)
[place] NOTE  Patient Care Team: Juline Patch, MD as PCP - General (Family Medicine) Vladimir Faster, [facility] (Inactive) (Pharmacist) Cammie Sickle, MD as Consulting Physician (Oncology)  CHIEF COMPLAINTS/PURPOSE OF CONSULTATION: Multiple myeloma  Oncology History Overview Note  # MULTIPLE MYELOMA  [FEB 2023-hypercalcemia status changes]-Active [bone lesions; hypercalcemia; anemia; No renal insuffiencey;Bone marrow-32% plasma cell-   STANDARD Cytogenetics].FEB 2023- S/p dexamethasone 20 mg q day x4.   # REV [15 mg -3 week-On and 1 week-OFF]-DARA; May 2023-discontinued rev 15 diarrhea/hypotension  # MAY 2023- dara-Rev 10 mg 3w/1w   # FEB 2023-hypercalcemia [[facility]-status post calcitonin; bisphosphonate]  BONE MARROW, ASPIRATE, CLOT, CORE:  -Hypercellular bone marrow with plasma cell neoplasm  -See comment   PERIPHERAL BLOOD:  -Normocytic-normochromic anemia   COMMENT:   The bone marrow is hypercellular for age with increased number of  atypical plasma cells representing 32% of all cells in the aspirate  associated with interstitial infiltrates and numerous variably sized  clusters in the clot/biopsy sections.  The plasma cells display weak  kappa light chain restriction consistent with plasma cell neoplasm.  Correlation with cytogenetic and FISH studies is recommended   MICROSCOPIC DESCRIPTION:   PERIPHERAL BLOOD SMEAR: The red blood cells display mild  anisopoikilocytosis with mild polychromasia.  The white blood cells are  normal number with scattered hypogranular neutrophils. An occasional  myelocyte and large atypical mononuclear cells are seen on scan. The  platelets are normal in number.    Multiple myeloma not having achieved remission ([place])  05/15/2021 Initial Diagnosis   Multiple myeloma not having achieved remission ([place])   06/27/2021 - 10/17/2021 Chemotherapy   Patient is on Treatment Plan : MYELOMA Daratumumab SQ + Lenalidomide +  Dexamethasone (DaraRd) q28d     06/27/2021 -  Chemotherapy   Patient is on Treatment Plan : MYELOMA RELAPSED REFRACTORY Daratumumab SQ + Lenalidomide + Dexamethasone (DaraRd) q28d     07/11/2021 Cancer Staging   Staging form: Plasma Cell Myeloma and Plasma Cell Disorders, AJCC 8th Edition - Clinical: Beta-2-microglobulin (mg/L): 2.6, Albumin (g/dL): 3.9, ISS: Stage I - Signed by Cammie Sickle, MD on 07/11/2021 Stage prefix: Initial diagnosis Beta 2 microglobulin range (mg/L): Less than 3.5 Albumin range (g/dL): Greater than or equal to 3.5     HISTORY OF PRESENTING ILLNESS: pt in a wheel chair; accompanied by her daughter.   Cristina Morgan 87 y.o.  female with  multiple myeloma currently on Rev-Dex is proceed with Dara SQ today.    Patient here for oncology follow-up appointment.  As per the daughter patient has been fairly active in the last few weeks.  Patient continues to be on 10 mg of Revlimid 3 weeks on 1 week off.  Denies any worsening reflux-like symptoms.  Denies any nausea vomiting.  Denies any blood in stools or black-colored stools.   Complains of bilateral extremity swelling dependent edema-worse especially morning  Patient states her back pain is improved.  Denies any worsening joint pains.  No falls.  No further episodes of mental status changes.  No diarrhea.   Review of Systems  Constitutional:  Positive for malaise/fatigue and weight loss. Negative for chills, diaphoresis and fever.  HENT:  Negative for nosebleeds and sore throat.   Eyes:  Negative for double vision.  Respiratory:  Negative for cough, hemoptysis, sputum production, shortness of breath and wheezing.   Cardiovascular:  Negative for chest pain, palpitations, orthopnea and leg swelling.  Gastrointestinal:  Negative for abdominal pain,  blood in stool, constipation, diarrhea, heartburn, melena, nausea and vomiting.  Genitourinary:  Negative for dysuria, frequency and urgency.  Musculoskeletal:   Positive for back pain and joint pain.  Skin: Negative.  Negative for itching and rash.  Neurological:  Negative for dizziness, tingling, focal weakness, weakness and headaches.  Endo/Heme/Allergies:  Does not bruise/bleed easily.  Psychiatric/Behavioral:  Negative for depression. The patient is not nervous/anxious and does not have insomnia.      MEDICAL HISTORY:  Past Medical History:  Diagnosis Date   Anemia    CAD (coronary artery disease)    Cataracts, bilateral    Closed compression fracture of first lumbar vertebra (HCC)    Closed compression fracture of second lumbar vertebra (HCC)    Diabetes mellitus without complication (HCC)    High cholesterol    History of pneumonia 2010   [place]   History of uterine fibroid    Hypercalcemia    Hyperlipidemia    Hypertension    Lytic bone lesions on xray    Multiple myeloma (HCC)    Osteoporosis    Primary osteoarthritis of right knee     SURGICAL HISTORY: Past Surgical History:  Procedure Laterality Date   CATARACT EXTRACTION Bilateral 2014   COLONOSCOPY  2011   cleared for 5 yrs- [facility]   ECTOPIC PREGNANCY SURGERY      SOCIAL HISTORY: Social History   Socioeconomic History   Marital status: Married    Spouse name: Not on file   Number of children: 2   Years of education: Not on file   Highest education level: 12th grade  Occupational History   Occupation: Retired  Tobacco Use   Smoking status: Never    Passive exposure: Never   Smokeless tobacco: Never   Tobacco comments:    smoking cessation materials not required  Vaping Use   Vaping Use: Never used  Substance and Sexual Activity   Alcohol use: No   Drug use: No   Sexual activity: Not Currently  Other Topics Concern   Not on file  Social History Narrative   [place] with husband; drives. Never smoked; no alcohol. Daughters live close.    Social Determinants of Health   Financial Resource Strain: Low Risk  (11/13/2021)   Overall Financial  Resource Strain (CARDIA)    Difficulty of Paying Living Expenses: Not hard at all  Food Insecurity: No Food Insecurity (11/13/2021)   Hunger Vital Sign    Worried About Running Out of Food in the Last Year: Never true    Ran Out of Food in the Last Year: Never true  Transportation Needs: No Transportation Needs (11/13/2021)   PRAPARE - Hydrologist (Medical): No    Lack of Transportation (Non-Medical): No  Physical Activity: Sufficiently Active (11/13/2021)   Exercise Vital Sign    Days of Exercise per Week: 7 days    Minutes of Exercise per Session: 30 min  Stress: No Stress Concern Present (11/13/2021)   [place]    Feeling of Stress : Not at all  Social Connections: Moderately Integrated (11/13/2021)   Social Connection and Isolation Panel [NHANES]    Frequency of Communication with Friends and Family: Twice a week    Frequency of Social Gatherings with Friends and Family: More than three times a week    Attends Religious Services: 1 to 4 times per year    Active Member of Clubs or Organizations: No  Attends Archivist Meetings: Never    Marital Status: Married  Human resources officer Violence: Not At Risk (11/13/2021)   Humiliation, Afraid, Rape, and Kick questionnaire    Fear of Current or Ex-Partner: No    Emotionally Abused: No    Physically Abused: No    Sexually Abused: No    FAMILY HISTORY: Family History  Problem Relation Age of Onset   Cancer Mother        breast   Heart disease Mother    Diabetes Father    Heart disease Father    Heart attack Father    Heart attack Brother    Diabetes Brother     ALLERGIES:  is allergic to latex and penicillins.  MEDICATIONS:  Current Outpatient Medications  Medication Sig Dispense Refill   ACCU-CHEK GUIDE test strip USE 1 STRIP TO CHECK GLUCOSE ONCE DAILY AS DIRECTED 100 each 0   Accu-Chek Softclix Lancets lancets USE 1  TO  CHECK GLUCOSE ONCE DAILY 100 each 0   acetaminophen (TYLENOL) 325 MG tablet Take 325 mg by mouth 2 (two) times daily. START 2 days prior to chemo infusion. Take For 2 days; Do NOT take on the day of infusion     acyclovir (ZOVIRAX) 400 MG tablet TAKE ONE TABLET BY MOUTH TWICE DAILY 60 tablet 4   BD PEN NEEDLE NANO 2ND GEN 32G X 4 MM MISC USE 1 ONCE DAILY     Blood Glucose Monitoring Suppl (ACCU-CHEK GUIDE) w/Device KIT USE AS DIRECTED TO CHECK GLUCOSE     Calcium Carbonate-Vit D-Min (CALCIUM 1200 PO) Take 1 capsule by mouth daily.     carvedilol (COREG) 3.125 MG tablet Take 1 tablet (3.125 mg total) by mouth 2 (two) times daily with a meal. 60 tablet 1   cholecalciferol (VITAMIN D3) 25 MCG (1000 UNIT) tablet Take 1,000 Units by mouth daily.     dexamethasone (DECADRON) 4 MG tablet Take 1 tablet (4 mg total) by mouth 2 (two) times daily. Start 2 days prior to infusion; Take it for 2 days. 60 tablet 3   diphenoxylate-atropine (LOMOTIL) 2.5-0.025 MG tablet Take 1 tablet by mouth 4 (four) times daily as needed for diarrhea or loose stools. Take it along with immodium 60 tablet 0   Glucerna (GLUCERNA) LIQD Take 237 mLs by mouth.     hydrOXYzine (ATARAX) 10 MG tablet Take 1 tablet (10 mg total) by mouth at bedtime as needed. 90 tablet 1   insulin glargine (LANTUS SOLOSTAR) 100 UNIT/ML Solostar Pen Inject 10 Units into the skin daily. 8 units every day except Wed and Thursday- 10 units 15 mL 1   lenalidomide (REVLIMID) 10 MG capsule Take 1 capsule (10 mg total) by mouth daily. Take for 21 days, then hold for 7 days. Repeat every 28 days. 21 capsule 0   losartan (COZAAR) 25 MG tablet TAKE ONE TABLET BY MOUTH EVERY EVENING 90 tablet 0   lovastatin (MEVACOR) 40 MG tablet Take 1 tablet (40 mg total) by mouth every evening. 90 tablet 1   metFORMIN (GLUCOPHAGE) 1000 MG tablet Take 1 tablet (1,000 mg total) by mouth 2 (two) times daily. 180 tablet 1   montelukast (SINGULAIR) 10 MG tablet Take ONE tablet by MOUTH  daily. START TWO DAYS prior TO chemo infusion. Take FOR TWO DAYS; DO NOT take ON THE DAY of infusion. 20 tablet 1   ondansetron (ZOFRAN) 4 MG tablet Take 1 tablet (4 mg total) by mouth every 6 (six) hours as needed for  nausea. 30 tablet 1   pantoprazole (PROTONIX) 40 MG tablet Take 1 tablet (40 mg total) by mouth daily. 90 tablet 1   sertraline (ZOLOFT) 25 MG tablet TAKE ONE TABLET BY MOUTH ONCE DAILY 90 tablet 1   triamcinolone (NASACORT) 55 MCG/ACT AERO nasal inhaler 2 sprays 2 (two) times daily.     No current facility-administered medications for this visit.      Marland Kitchen  PHYSICAL EXAMINATION:  Vitals:   01/10/22 1009  BP: (!) 155/61  Pulse: 65  Resp: 17  Temp: (!) 97 F (36.1 C)  SpO2: 96%   Filed Weights   01/10/22 1009  Weight: 124 lb (56.2 kg)    Physical Exam Vitals and nursing note reviewed.  HENT:     Head: Normocephalic and atraumatic.     Mouth/Throat:     Pharynx: Oropharynx is clear.  Eyes:     Extraocular Movements: Extraocular movements intact.     Pupils: Pupils are equal, round, and reactive to light.  Cardiovascular:     Rate and Rhythm: Normal rate and regular rhythm.  Pulmonary:     Comments: Decreased breath sounds bilaterally.  Abdominal:     Palpations: Abdomen is soft.  Musculoskeletal:        General: Normal range of motion.     Cervical back: Normal range of motion.  Skin:    General: Skin is warm.  Neurological:     General: No focal deficit present.     Mental Status: She is alert and oriented to person, place, and time.  Psychiatric:        Behavior: Behavior normal.        Judgment: Judgment normal.      LABORATORY DATA:  I have reviewed the data as listed Lab Results  Component Value Date   WBC 4.5 01/10/2022   HGB 9.0 (L) 01/10/2022   HCT 29.2 (L) 01/10/2022   MCV 102.1 (H) 01/10/2022   PLT 332 01/10/2022   Recent Labs    02/01/21 1502 02/18/21 1615 04/15/21 1549 05/01/21 0320 11/28/21 1018 12/12/21 1119  01/10/22 0929  NA 138  --   --    < > 137 136 139  K 4.3  --   --    < > 4.1 3.4* 3.8  CL 101  --   --    < > 107 106 107  CO2 23  --   --    < > _0 GLUCOSE 178*  --   --    < > 174* 176* 115*  BUN 19  --   --    < > _1 CREATININE 0.84  --   --    < > 0.91 1.11* 1.15*  CALCIUM 10.7*   < >  --    < > 9.3 9.4 9.4  GFRNONAA  --   --   --    < > >60 49* 47*  PROT  --   --   --    < > 6.3* 6.9 6.6  ALBUMIN 4.3  --  4.4   < > 3.6 3.8 3.8  AST 16  --  17   < > _2 ALT 9  --  8   < > _3 ALKPHOS 102  --  108   < > 64 70 66  BILITOT 0.3  --  0.3   < > 0.5 0.5 0.6  BILIDIR 0.12  --  0.10  --   --   --   --    < > = values in this interval not displayed.    RADIOGRAPHIC STUDIES: I have personally reviewed the radiological images as listed and agreed with the findings in the report. No results found.  Multiple myeloma not having achieved remission ([place]) # STAGE I- MULTIPLE MYELOMA -Active [bone lesions; hypercalcemia; anemia; No renal insuffiencey;Bone marrow-32% plasma cell-   STANDARD Cytogenetics].  Currently on Revlimid-Dex- Dara SQ. Partial response noted; SEP 2023-M protein = 0.2 gm/fl; K/L= 1. 5.  STABLE.  # Proceed with Dara SQ today; Labs today reviewed;  acceptable for treatment today. On Revlimid at 10 mg dose 3 weeks on 1 week off Decrease dex to 4 mg weekly [reflux like symtoms].   STABLE.   #Reflux-like symptoms- ?  Secondary to dexamethasone.  Continue low-dose 4 mg weekly continue PPI. STABLE.  # Anemia- Hb 9; JUNE 2023- Iron sat-13; Ferritin- 66. Trial of gentle iron. Consider IV iron.   # Diarrhea- improved on Revlimid 10 mg a day- 3 weeks-ON & 1 week OFF.  STABLE.  # Diabetes-on oral hypoglycemic agents; on long acting insulin; 14 units of lantus on T/W/Thursday- Defer to PCP.PBF- BG-176  Again recommend inform PCP regarding blood sugars log.STABLE  # HTN: continue losartan; continue coreg; BP- 150-160- refilled coreg; STABLE  # Hypercalcemia  secondary to multiple myeloma- [FEB 2023-Zometa ; calcitonin]-calcium today is 9.6. Zometa q 4W. STABLE  # Weight loss-s/p re-veluation with Joli -STABLE  # Bil LE swelling- recommend compression stockings; clinically not suggestive of CHF.  Check BNP.   #DVT/shingles prophylaxis: Aspirin/acyclovir.STABLE  # Vaccinations; Ok with flu/COVID shot  # IV access:PIV   [name]- [phone]  zometa-q 4w- ; MM; K/L q 4 W  # DISPOSITION:  # ADD BNP to lab today- ordered.  # proceed with  dara SQ;  # follow up in 5 weeks/Friday  MD; labs- cbc/cmp MM panel;K/L light chains; iron studies; ferritin;BN ; Zometa - ;Dara SQ;possible venofer--;Dr.B       All questions were answered. The patient knows to call the clinic with any problems, questions or concerns.       Cammie Sickle, MD 01/10/2022 8:02 PM

## 2022-01-13 LAB — KAPPA/LAMBDA LIGHT CHAINS
Kappa free light chain: 20.8 mg/L — ABNORMAL HIGH (ref 3.3–19.4)
Kappa, lambda light chain ratio: 1.63 (ref 0.26–1.65)
Lambda free light chains: 12.8 mg/L (ref 5.7–26.3)

## 2022-01-15 ENCOUNTER — Other Ambulatory Visit: Payer: Self-pay | Admitting: Internal Medicine

## 2022-01-15 DIAGNOSIS — I10 Essential (primary) hypertension: Secondary | ICD-10-CM

## 2022-01-15 LAB — MULTIPLE MYELOMA PANEL, SERUM
Albumin SerPl Elph-Mcnc: 3.7 g/dL (ref 2.9–4.4)
Albumin/Glob SerPl: 1.6 (ref 0.7–1.7)
Alpha 1: 0.2 g/dL (ref 0.0–0.4)
Alpha2 Glob SerPl Elph-Mcnc: 0.7 g/dL (ref 0.4–1.0)
B-Globulin SerPl Elph-Mcnc: 1 g/dL (ref 0.7–1.3)
Gamma Glob SerPl Elph-Mcnc: 0.5 g/dL (ref 0.4–1.8)
Globulin, Total: 2.4 g/dL (ref 2.2–3.9)
IgA: 82 mg/dL (ref 64–422)
IgG (Immunoglobin G), Serum: 575 mg/dL — ABNORMAL LOW (ref 586–1602)
IgM (Immunoglobulin M), Srm: 27 mg/dL (ref 26–217)
M Protein SerPl Elph-Mcnc: 0.2 g/dL — ABNORMAL HIGH
Total Protein ELP: 6.1 g/dL (ref 6.0–8.5)

## 2022-01-24 ENCOUNTER — Telehealth: Payer: Self-pay

## 2022-01-24 ENCOUNTER — Encounter: Payer: Self-pay | Admitting: Internal Medicine

## 2022-01-24 ENCOUNTER — Other Ambulatory Visit (HOSPITAL_COMMUNITY): Payer: Self-pay

## 2022-01-24 NOTE — Telephone Encounter (Signed)
Oral Oncology Patient Advocate Encounter  Was successful in securing patient a $12,000 grant from [facility] to provide copayment coverage for Revlimid.  This will keep the out of pocket expense at $0.     [employer] ID: [id number]  I have spoken with the patient.   The billing information is as follows and has been shared with [facility].    RxBin: [id number] PCN: [id number] Member ID: [id number] Group ID: [id number] Dates of Eligibility: 10.11.23 through 10.10.24  Fund:  Multiple Myeloma - Medicare [place], [facility] [place]  [phone] (phone) [phone] (fax) 01/24/2022 2:44 PM

## 2022-01-27 ENCOUNTER — Other Ambulatory Visit: Payer: Self-pay | Admitting: Family Medicine

## 2022-01-28 NOTE — Telephone Encounter (Signed)
Requested medication (s) are due for refill today - yes  Requested medication (s) are on the active medication list -yes  Future visit scheduled -yes  Last refill: 06/03/21  Notes to clinic: listed historical provider  Requested Prescriptions  Pending Prescriptions Disp Refills   BD PEN NEEDLE NANO 2ND GEN 32G X 4 MM MISC [Pharmacy Med Name: BD PEN NEEDLE/NANO 32GX4MM MIS] 100 each 0    Sig: USE 1 ONCE DAILY     Endocrinology: Diabetes - Testing Supplies Passed - 01/27/2022 11:02 AM      Passed - Valid encounter within last 12 months    Recent Outpatient Visits           4 weeks ago Type 2 diabetes mellitus without complication, with long-term current use of insulin ([place])   [place] [facility] at [facility], MD   5 months ago Type 2 diabetes mellitus with other specified complication, with long-term current use of insulin ([place])   [place] [facility] at [facility], MD   6 months ago Type 2 diabetes mellitus without complication, without long-term current use of insulin ([place])   [place] [facility] at [facility], MD   7 months ago Type 2 diabetes mellitus without complication, without long-term current use of insulin ([place])   [place] [facility] at [facility], MD   7 months ago Type 2 diabetes mellitus without complication, without long-term current use of insulin ([place])   [place] [facility] at [place], Deanna C, MD       Future Appointments             In 3 months Juline Patch, MD [facility] at [facility], [facility]               Requested Prescriptions  Pending Prescriptions Disp Refills   BD PEN NEEDLE NANO 2ND GEN 32G X 4 MM MISC [Pharmacy Med Name: BD PEN NEEDLE/NANO 32GX4MM MIS] 100 each 0    Sig: USE 1  ONCE DAILY     Endocrinology: Diabetes - Testing Supplies Passed - 01/27/2022 11:02 AM      Passed - Valid encounter within last 12 months    Recent Outpatient Visits           4 weeks ago Type 2 diabetes mellitus without complication, with long-term current use of insulin ([place])   [place] [facility] at [facility], MD   5 months ago Type 2 diabetes mellitus with other specified complication, with long-term current use of insulin ([place])   [place] [facility] at [facility], MD   6 months ago Type 2 diabetes mellitus without complication, without long-term current use of insulin ([place])   [place] [facility] at [facility], MD   7 months ago Type 2 diabetes mellitus without complication, without long-term current use of insulin ([place])   [place] [facility] at [facility], MD   7 months ago Type 2 diabetes mellitus without complication, without long-term current use of insulin ([place])   [place] [facility] at [facility], MD  Future Appointments             In 3 months Ronnald Ramp, Iven Finn, MD [facility] at [facility], [facility]

## 2022-02-04 ENCOUNTER — Other Ambulatory Visit: Payer: Self-pay | Admitting: Pharmacist

## 2022-02-04 DIAGNOSIS — C9000 Multiple myeloma not having achieved remission: Secondary | ICD-10-CM

## 2022-02-04 MED ORDER — LENALIDOMIDE 10 MG PO CAPS: 10.0000 mg | ORAL_CAPSULE | Freq: Every day | ORAL | 0 refills | Status: DC

## 2022-02-12 ENCOUNTER — Other Ambulatory Visit: Payer: Self-pay | Admitting: Internal Medicine

## 2022-02-14 ENCOUNTER — Inpatient Hospital Stay: Payer: Medicare Other

## 2022-02-14 ENCOUNTER — Inpatient Hospital Stay: Payer: Medicare Other | Attending: Nurse Practitioner

## 2022-02-14 ENCOUNTER — Encounter: Payer: Self-pay | Admitting: Internal Medicine

## 2022-02-14 ENCOUNTER — Inpatient Hospital Stay (HOSPITAL_BASED_OUTPATIENT_CLINIC_OR_DEPARTMENT_OTHER): Payer: Medicare Other | Admitting: Internal Medicine

## 2022-02-14 VITALS — BP 162/59 | HR 60 | Temp 98.6°F | Resp 20 | Wt 135.0 lb

## 2022-02-14 DIAGNOSIS — Z5112 Encounter for antineoplastic immunotherapy: Secondary | ICD-10-CM | POA: Insufficient documentation

## 2022-02-14 DIAGNOSIS — I959 Hypotension, unspecified: Secondary | ICD-10-CM | POA: Diagnosis not present

## 2022-02-14 DIAGNOSIS — R0609 Other forms of dyspnea: Secondary | ICD-10-CM

## 2022-02-14 DIAGNOSIS — K30 Functional dyspepsia: Secondary | ICD-10-CM | POA: Insufficient documentation

## 2022-02-14 DIAGNOSIS — C9000 Multiple myeloma not having achieved remission: Secondary | ICD-10-CM

## 2022-02-14 DIAGNOSIS — R600 Localized edema: Secondary | ICD-10-CM | POA: Diagnosis not present

## 2022-02-14 DIAGNOSIS — R634 Abnormal weight loss: Secondary | ICD-10-CM | POA: Insufficient documentation

## 2022-02-14 DIAGNOSIS — Z7961 Long term (current) use of immunomodulator: Secondary | ICD-10-CM | POA: Diagnosis not present

## 2022-02-14 DIAGNOSIS — Z794 Long term (current) use of insulin: Secondary | ICD-10-CM | POA: Diagnosis not present

## 2022-02-14 DIAGNOSIS — I10 Essential (primary) hypertension: Secondary | ICD-10-CM | POA: Diagnosis not present

## 2022-02-14 DIAGNOSIS — E119 Type 2 diabetes mellitus without complications: Secondary | ICD-10-CM | POA: Diagnosis not present

## 2022-02-14 DIAGNOSIS — Z79899 Other long term (current) drug therapy: Secondary | ICD-10-CM | POA: Insufficient documentation

## 2022-02-14 DIAGNOSIS — R197 Diarrhea, unspecified: Secondary | ICD-10-CM | POA: Insufficient documentation

## 2022-02-14 DIAGNOSIS — D630 Anemia in neoplastic disease: Secondary | ICD-10-CM | POA: Insufficient documentation

## 2022-02-14 DIAGNOSIS — R5383 Other fatigue: Secondary | ICD-10-CM | POA: Insufficient documentation

## 2022-02-14 LAB — COMPREHENSIVE METABOLIC PANEL
ALT: 13 U/L (ref 0–44)
AST: 25 U/L (ref 15–41)
Albumin: 3.8 g/dL (ref 3.5–5.0)
Alkaline Phosphatase: 59 U/L (ref 38–126)
Anion gap: 10 (ref 5–15)
BUN: 25 mg/dL — ABNORMAL HIGH (ref 8–23)
CO2: 22 mmol/L (ref 22–32)
Calcium: 9.5 mg/dL (ref 8.9–10.3)
Chloride: 105 mmol/L (ref 98–111)
Creatinine, Ser: 1.06 mg/dL — ABNORMAL HIGH (ref 0.44–1.00)
GFR, Estimated: 51 mL/min — ABNORMAL LOW (ref >60)
Glucose, Bld: 125 mg/dL — ABNORMAL HIGH (ref 70–99)
Potassium: 3.5 mmol/L (ref 3.5–5.1)
Sodium: 137 mmol/L (ref 135–145)
Total Bilirubin: 0.6 mg/dL (ref 0.3–1.2)
Total Protein: 6.2 g/dL — ABNORMAL LOW (ref 6.5–8.1)

## 2022-02-14 LAB — CBC WITH DIFFERENTIAL/PLATELET
Abs Immature Granulocytes: 0 10*3/uL (ref 0.00–0.07)
Basophils Absolute: 0.1 10*3/uL (ref 0.0–0.1)
Basophils Relative: 2 %
Eosinophils Absolute: 0.2 10*3/uL (ref 0.0–0.5)
Eosinophils Relative: 4 %
HCT: 28 % — ABNORMAL LOW (ref 36.0–46.0)
Hemoglobin: 8.8 g/dL — ABNORMAL LOW (ref 12.0–15.0)
Immature Granulocytes: 0 %
Lymphocytes Relative: 46 %
Lymphs Abs: 2 10*3/uL (ref 0.7–4.0)
MCH: 30.8 pg (ref 26.0–34.0)
MCHC: 31.4 g/dL (ref 30.0–36.0)
MCV: 97.9 fL (ref 80.0–100.0)
Monocytes Absolute: 0.7 10*3/uL (ref 0.1–1.0)
Monocytes Relative: 17 %
Neutro Abs: 1.3 10*3/uL — ABNORMAL LOW (ref 1.7–7.7)
Neutrophils Relative %: 31 %
Platelets: 241 10*3/uL (ref 150–400)
RBC: 2.86 MIL/uL — ABNORMAL LOW (ref 3.87–5.11)
RDW: 13.8 % (ref 11.5–15.5)
WBC: 4.3 10*3/uL (ref 4.0–10.5)
nRBC: 0 % (ref 0.0–0.2)

## 2022-02-14 LAB — FERRITIN: Ferritin: 26 ng/mL (ref 11–307)

## 2022-02-14 LAB — IRON AND TIBC
Iron: 46 ug/dL (ref 28–170)
Saturation Ratios: 11 % (ref 10.4–31.8)
TIBC: 416 ug/dL (ref 250–450)
UIBC: 370 ug/dL

## 2022-02-14 LAB — BRAIN NATRIURETIC PEPTIDE: B Natriuretic Peptide: 191.9 pg/mL — ABNORMAL HIGH (ref 0.0–100.0)

## 2022-02-14 MED ORDER — ACYCLOVIR 400 MG PO TABS: 400.0000 mg | ORAL_TABLET | Freq: Two times a day (BID) | ORAL | 4 refills | Status: DC

## 2022-02-14 MED ORDER — ZOLEDRONIC ACID 4 MG/5ML IV CONC
3.0000 mg | Freq: Once | INTRAVENOUS | Status: AC
  Administered 2022-02-14: 3 mg via INTRAVENOUS
  Filled 2022-02-14: qty 3.75

## 2022-02-14 MED ORDER — ONDANSETRON HCL 8 MG PO TABS: ORAL_TABLET | ORAL | 1 refills | Status: DC

## 2022-02-14 MED ORDER — DARATUMUMAB-HYALURONIDASE-FIHJ 1800-30000 MG-UT/15ML ~~LOC~~ SOLN
1800.0000 mg | Freq: Once | SUBCUTANEOUS | Status: AC
  Administered 2022-02-14: 1800 mg via SUBCUTANEOUS
  Filled 2022-02-14: qty 15

## 2022-02-14 MED ORDER — SODIUM CHLORIDE 0.9 % IV SOLN
Freq: Once | INTRAVENOUS | Status: AC
  Filled 2022-02-14: qty 250

## 2022-02-14 MED ORDER — DIPHENHYDRAMINE HCL 25 MG PO CAPS
50.0000 mg | ORAL_CAPSULE | Freq: Once | ORAL | Status: AC
  Administered 2022-02-14: 50 mg via ORAL
  Filled 2022-02-14: qty 2

## 2022-02-14 MED ORDER — ACETAMINOPHEN 325 MG PO TABS
650.0000 mg | ORAL_TABLET | Freq: Once | ORAL | Status: AC
  Administered 2022-02-14: 650 mg via ORAL
  Filled 2022-02-14: qty 2

## 2022-02-14 MED ORDER — SODIUM CHLORIDE 0.9 % IV SOLN
200.0000 mg | INTRAVENOUS | Status: DC
  Administered 2022-02-14: 200 mg via INTRAVENOUS
  Filled 2022-02-14: qty 200

## 2022-02-14 MED ORDER — DEXAMETHASONE 4 MG PO TABS
20.0000 mg | ORAL_TABLET | Freq: Once | ORAL | Status: AC
  Administered 2022-02-14: 20 mg via ORAL
  Filled 2022-02-14: qty 5

## 2022-02-14 MED ORDER — PROCHLORPERAZINE MALEATE 10 MG PO TABS: 10.0000 mg | ORAL_TABLET | Freq: Four times a day (QID) | ORAL | 1 refills | Status: DC | PRN

## 2022-02-14 MED FILL — Iron Sucrose Inj 20 MG/ML (Fe Equiv): INTRAVENOUS | Qty: 10 | Status: AC

## 2022-02-14 NOTE — Progress Notes (Signed)
Patient has no concerns today. 

## 2022-02-14 NOTE — Assessment & Plan Note (Addendum)
#  STAGE I- MULTIPLE MYELOMA -Active [bone lesions; hypercalcemia; anemia; No renal insuffiencey;Bone marrow-32% plasma cell-   STANDARD Cytogenetics].  Currently on Revlimid-Dex- Dara SQ. Partial response noted; OCT 2023-M protein = 0.2 gm/fl; K/L RATIO= 1. 5.  STABLE.  # Proceed with Dara SQ today; Labs today reviewed;  acceptable for treatment today. On Revlimid at 10 mg dose 3 weeks on 1 week off Decrease dex to 4 mg weekly [reflux like symtoms].   STABLE.   #Reflux-like symptoms- ?  Secondary to dexamethasone.  Continue low-dose 4 mg weekly continue PPI. STABLE.  # Anemia- Hb 9; JUNE 2023- Iron sat-13; Ferritin- 66. Reminded of trial of gentle iron. Proceed with venofer today  # Diarrhea- improved on Revlimid 10 mg a day- 3 weeks-ON & 1 week OFF.  STABLE.  # Diabetes-on oral hypoglycemic agents; on long acting insulin; 14 units of lantus on T/W/Thursday- Defer to PCP.PBF- BG-176  Again recommend inform PCP regarding blood sugars log.STABLE  # HTN: continue losartan; continue coreg; BP- 150-160- refilled coreg; STABLE  # Hypercalcemia secondary to multiple myeloma- [FEB 2023-Zometa ; calcitonin]-calcium today is 9.6. Zometa q 4W. STABLE  # Weight loss-s/p re-veluation with Joli -STABLE  #DVT/shingles prophylaxis: Aspirin/acyclovir.STABLE  # Vaccinations; Ok with flu/COVID shot  # IV access:PIV   [name]  zometa-q 3 months [next March 2024]- ; MM; K/L q 4 W  # DISPOSITION:  # proceed with  dara SQ; venofer; Zometa  # follow up in 4 weeks/Friday  MD; labs- cbc/cmp MM panel;K/L light chains; - ;Dara SQ; possible venofer- ;Dr.B

## 2022-02-14 NOTE — Progress Notes (Signed)
Per Dr. Rogue Bussing, no post injection monitoring needed

## 2022-02-14 NOTE — Patient Instructions (Signed)
[facility] AT [place]  Discharge Instructions: Thank you for choosing [facility] to provide your oncology and hematology care.  If you have a lab appointment with the [facility], please go directly to the [place] and check in at the registration area.  Wear comfortable clothing and clothing appropriate for easy access to any Portacath or PICC line.   We strive to give you quality time with your provider. You may need to reschedule your appointment if you arrive late (15 or more minutes).  Arriving late affects you and other patients whose appointments are after yours.  Also, if you miss three or more appointments without notifying the office, you may be dismissed from the clinic at the provider's discretion.      For prescription refill requests, have your pharmacy contact our office and allow 72 hours for refills to be completed.    Today you received the following chemotherapy and/or immunotherapy agents Darzalex, Zometa      To help prevent nausea and vomiting after your treatment, we encourage you to take your nausea medication as directed.  BELOW ARE SYMPTOMS THAT SHOULD BE REPORTED IMMEDIATELY: *FEVER GREATER THAN 100.4 F (38 C) OR HIGHER *CHILLS OR SWEATING *NAUSEA AND VOMITING THAT IS NOT CONTROLLED WITH YOUR NAUSEA MEDICATION *UNUSUAL SHORTNESS OF BREATH *UNUSUAL BRUISING OR BLEEDING *URINARY PROBLEMS (pain or burning when urinating, or frequent urination) *BOWEL PROBLEMS (unusual diarrhea, constipation, pain near the anus) TENDERNESS IN MOUTH AND THROAT WITH OR WITHOUT PRESENCE OF ULCERS (sore throat, sores in mouth, or a toothache) UNUSUAL RASH, SWELLING OR PAIN  UNUSUAL VAGINAL DISCHARGE OR ITCHING   Items with * indicate a potential emergency and should be followed up as soon as possible or go to the Emergency Department if any problems should occur.  Please show the CHEMOTHERAPY ALERT CARD or IMMUNOTHERAPY ALERT CARD at  check-in to the Emergency Department and triage nurse.  Should you have questions after your visit or need to cancel or reschedule your appointment, please contact [facility] [place] AT [place]  [phone] and follow the prompts.  Office hours are 8:00 a.m. to 4:30 p.m. Monday - Friday. Please note that voicemails left after 4:00 p.m. may not be returned until the following business day.  We are closed weekends and major holidays. You have access to a nurse at all times for urgent questions. Please call the main number to the clinic [phone] and follow the prompts.  For any non-urgent questions, you may also contact your provider using MyChart. We now offer e-Visits for anyone 11 and older to request care online for non-urgent symptoms. For details visit [email or web address].   Also download the MyChart app! Go to the app store, search "MyChart", open the app, select [place], and log in with your MyChart username and password.  Masks are optional in the cancer centers. If you would like for your care team to wear a mask while they are taking care of you, please let them know. For doctor visits, patients may have with them one support person who is at least 85 years old. At this time, visitors are not allowed in the infusion area.

## 2022-02-14 NOTE — Progress Notes (Signed)
[place] NOTE  Patient Care Team: Juline Patch, MD as PCP - General (Family Medicine) Vladimir Faster, [facility] (Inactive) (Pharmacist) Cammie Sickle, MD as Consulting Physician (Oncology)  CHIEF COMPLAINTS/PURPOSE OF CONSULTATION: Multiple myeloma  Oncology History Overview Note  # MULTIPLE MYELOMA  [FEB 2023-hypercalcemia status changes]-Active [bone lesions; hypercalcemia; anemia; No renal insuffiencey;Bone marrow-32% plasma cell-   STANDARD Cytogenetics].FEB 2023- S/p dexamethasone 20 mg q day x4.   # REV [15 mg -3 week-On and 1 week-OFF]-DARA; May 2023-discontinued rev 15 diarrhea/hypotension  # MAY 2023- dara-Rev 10 mg 3w/1w   # FEB 2023-hypercalcemia [[facility]-status post calcitonin; bisphosphonate]  BONE MARROW, ASPIRATE, CLOT, CORE:  -Hypercellular bone marrow with plasma cell neoplasm  -See comment   PERIPHERAL BLOOD:  -Normocytic-normochromic anemia   COMMENT:   The bone marrow is hypercellular for age with increased number of  atypical plasma cells representing 32% of all cells in the aspirate  associated with interstitial infiltrates and numerous variably sized  clusters in the clot/biopsy sections.  The plasma cells display weak  kappa light chain restriction consistent with plasma cell neoplasm.  Correlation with cytogenetic and FISH studies is recommended   MICROSCOPIC DESCRIPTION:   PERIPHERAL BLOOD SMEAR: The red blood cells display mild  anisopoikilocytosis with mild polychromasia.  The white blood cells are  normal number with scattered hypogranular neutrophils. An occasional  myelocyte and large atypical mononuclear cells are seen on scan. The  platelets are normal in number.    Multiple myeloma not having achieved remission ([place])  05/15/2021 Initial Diagnosis   Multiple myeloma not having achieved remission ([place])   06/27/2021 - 10/17/2021 Chemotherapy   Patient is on Treatment Plan : MYELOMA Daratumumab SQ + Lenalidomide +  Dexamethasone (DaraRd) q28d     06/27/2021 -  Chemotherapy   Patient is on Treatment Plan : MYELOMA RELAPSED REFRACTORY Daratumumab SQ + Lenalidomide + Dexamethasone (DaraRd) q28d     07/11/2021 Cancer Staging   Staging form: Plasma Cell Myeloma and Plasma Cell Disorders, AJCC 8th Edition - Clinical: Beta-2-microglobulin (mg/L): 2.6, Albumin (g/dL): 3.9, ISS: Stage I - Signed by Cammie Sickle, MD on 07/11/2021 Stage prefix: Initial diagnosis Beta 2 microglobulin range (mg/L): Less than 3.5 Albumin range (g/dL): Greater than or equal to 3.5     HISTORY OF PRESENTING ILLNESS: pt in a wheel chair; accompanied by her daughter.   Cristina Morgan 87 y.o.  female with  multiple myeloma currently on Rev-Dex is proceed with Dara SQ today.    Patient has no concerns today.   As per the daughter patient has been fairly active in the last few weeks. Complains of worsening fatigue. Not started on PO iron yet.   Patient continues to be on 10 mg of Revlimid 3 weeks on 1 week off.  Denies any worsening reflux-like symptoms.  Denies any nausea vomiting.  Denies any blood in stools or black-colored stools.   Patient states her back pain is improved.  Denies any worsening joint pains.  No falls.  No further episodes of mental status changes.  No diarrhea.   Review of Systems  Constitutional:  Positive for malaise/fatigue and weight loss. Negative for chills, diaphoresis and fever.  HENT:  Negative for nosebleeds and sore throat.   Eyes:  Negative for double vision.  Respiratory:  Negative for cough, hemoptysis, sputum production, shortness of breath and wheezing.   Cardiovascular:  Negative for chest pain, palpitations, orthopnea and leg swelling.  Gastrointestinal:  Negative for abdominal  pain, blood in stool, constipation, diarrhea, heartburn, melena, nausea and vomiting.  Genitourinary:  Negative for dysuria, frequency and urgency.  Musculoskeletal:  Positive for back pain and joint pain.   Skin: Negative.  Negative for itching and rash.  Neurological:  Negative for dizziness, tingling, focal weakness, weakness and headaches.  Endo/Heme/Allergies:  Does not bruise/bleed easily.  Psychiatric/Behavioral:  Negative for depression. The patient is not nervous/anxious and does not have insomnia.      MEDICAL HISTORY:  Past Medical History:  Diagnosis Date   Anemia    CAD (coronary artery disease)    Cataracts, bilateral    Closed compression fracture of first lumbar vertebra (HCC)    Closed compression fracture of second lumbar vertebra (HCC)    Diabetes mellitus without complication (HCC)    High cholesterol    History of pneumonia 2010   [place]   History of uterine fibroid    Hypercalcemia    Hyperlipidemia    Hypertension    Lytic bone lesions on xray    Multiple myeloma (HCC)    Osteoporosis    Primary osteoarthritis of right knee     SURGICAL HISTORY: Past Surgical History:  Procedure Laterality Date   CATARACT EXTRACTION Bilateral 2014   COLONOSCOPY  2011   cleared for 5 yrs- [facility]   ECTOPIC PREGNANCY SURGERY      SOCIAL HISTORY: Social History   Socioeconomic History   Marital status: Married    Spouse name: Not on file   Number of children: 2   Years of education: Not on file   Highest education level: 12th grade  Occupational History   Occupation: Retired  Tobacco Use   Smoking status: Never    Passive exposure: Never   Smokeless tobacco: Never   Tobacco comments:    smoking cessation materials not required  Vaping Use   Vaping Use: Never used  Substance and Sexual Activity   Alcohol use: No   Drug use: No   Sexual activity: Not Currently  Other Topics Concern   Not on file  Social History Narrative   [place] with husband; drives. Never smoked; no alcohol. Daughters live close.    Social Determinants of Health   Financial Resource Strain: Low Risk  (11/13/2021)   Overall Financial Resource Strain (CARDIA)    Difficulty  of Paying Living Expenses: Not hard at all  Food Insecurity: No Food Insecurity (11/13/2021)   Hunger Vital Sign    Worried About Running Out of Food in the Last Year: Never true    Ran Out of Food in the Last Year: Never true  Transportation Needs: No Transportation Needs (11/13/2021)   PRAPARE - Hydrologist (Medical): No    Lack of Transportation (Non-Medical): No  Physical Activity: Sufficiently Active (11/13/2021)   Exercise Vital Sign    Days of Exercise per Week: 7 days    Minutes of Exercise per Session: 30 min  Stress: No Stress Concern Present (11/13/2021)   [place]    Feeling of Stress : Not at all  Social Connections: Moderately Integrated (11/13/2021)   Social Connection and Isolation Panel [NHANES]    Frequency of Communication with Friends and Family: Twice a week    Frequency of Social Gatherings with Friends and Family: More than three times a week    Attends Religious Services: 1 to 4 times per year    Active Member of Clubs or Organizations: No  Attends Archivist Meetings: Never    Marital Status: Married  Human resources officer Violence: Not At Risk (11/13/2021)   Humiliation, Afraid, Rape, and Kick questionnaire    Fear of Current or Ex-Partner: No    Emotionally Abused: No    Physically Abused: No    Sexually Abused: No    FAMILY HISTORY: Family History  Problem Relation Age of Onset   Cancer Mother        breast   Heart disease Mother    Diabetes Father    Heart disease Father    Heart attack Father    Heart attack Brother    Diabetes Brother     ALLERGIES:  is allergic to latex and penicillins.  MEDICATIONS:  Current Outpatient Medications  Medication Sig Dispense Refill   ACCU-CHEK GUIDE test strip USE 1 STRIP TO CHECK GLUCOSE ONCE DAILY AS DIRECTED 100 each 0   Accu-Chek Softclix Lancets lancets USE 1  TO CHECK GLUCOSE ONCE DAILY 100 each 0    acetaminophen (TYLENOL) 325 MG tablet Take 325 mg by mouth 2 (two) times daily. START 2 days prior to chemo infusion. Take For 2 days; Do NOT take on the day of infusion     BD PEN NEEDLE NANO 2ND GEN 32G X 4 MM MISC USE 1  ONCE DAILY 100 each 0   Blood Glucose Monitoring Suppl (ACCU-CHEK GUIDE) w/Device KIT USE AS DIRECTED TO CHECK GLUCOSE     Calcium Carbonate-Vit D-Min (CALCIUM 1200 PO) Take 1 capsule by mouth daily.     carvedilol (COREG) 3.125 MG tablet TAKE ONE TABLET BY MOUTH TWICE DAILY with meals 60 tablet 1   cholecalciferol (VITAMIN D3) 25 MCG (1000 UNIT) tablet Take 1,000 Units by mouth daily.     dexamethasone (DECADRON) 4 MG tablet Take 1 tablet (4 mg total) by mouth 2 (two) times daily. Start 2 days prior to infusion; Take it for 2 days. 60 tablet 3   diphenoxylate-atropine (LOMOTIL) 2.5-0.025 MG tablet Take 1 tablet by mouth 4 (four) times daily as needed for diarrhea or loose stools. Take it along with immodium 60 tablet 0   Glucerna (GLUCERNA) LIQD Take 237 mLs by mouth.     hydrOXYzine (ATARAX) 10 MG tablet Take 1 tablet (10 mg total) by mouth at bedtime as needed. 90 tablet 1   insulin glargine (LANTUS SOLOSTAR) 100 UNIT/ML Solostar Pen Inject 10 Units into the skin daily. 8 units every day except Wed and Thursday- 10 units 15 mL 1   lenalidomide (REVLIMID) 10 MG capsule Take 1 capsule (10 mg total) by mouth daily. Take for 21 days, then hold for 7 days. Repeat every 28 days. 21 capsule 0   losartan (COZAAR) 25 MG tablet TAKE ONE TABLET BY MOUTH EVERY EVENING 90 tablet 0   lovastatin (MEVACOR) 40 MG tablet Take 1 tablet (40 mg total) by mouth every evening. 90 tablet 1   metFORMIN (GLUCOPHAGE) 1000 MG tablet Take 1 tablet (1,000 mg total) by mouth 2 (two) times daily. 180 tablet 1   montelukast (SINGULAIR) 10 MG tablet Take ONE tablet by MOUTH daily. START TWO DAYS prior TO chemo infusion. Take FOR TWO DAYS; DO NOT take ON THE DAY of infusion. 20 tablet 1   ondansetron (ZOFRAN) 8  MG tablet One pill every 8 hours as needed for nausea/vomitting. 40 tablet 1   pantoprazole (PROTONIX) 40 MG tablet Take 1 tablet (40 mg total) by mouth daily. 90 tablet 1   prochlorperazine (COMPAZINE) 10  MG tablet Take 1 tablet (10 mg total) by mouth every 6 (six) hours as needed for nausea or vomiting. 40 tablet 1   sertraline (ZOLOFT) 25 MG tablet TAKE ONE TABLET BY MOUTH ONCE DAILY 90 tablet 1   triamcinolone (NASACORT) 55 MCG/ACT AERO nasal inhaler 2 sprays 2 (two) times daily.     acyclovir (ZOVIRAX) 400 MG tablet Take 1 tablet (400 mg total) by mouth 2 (two) times daily. 60 tablet 4   No current facility-administered medications for this visit.   Facility-Administered Medications Ordered in Other Visits  Medication Dose Route Frequency Provider Last Rate Last Admin   iron sucrose (VENOFER) 200 mg in sodium chloride 0.9 % 100 mL IVPB  200 mg Intravenous Weekly Cammie Sickle, MD   Stopped at 02/14/22 1117      .  PHYSICAL EXAMINATION:  Vitals:   02/14/22 0927  BP: (!) 162/59  Pulse: 60  Resp: 20  Temp: 98.6 F (37 C)  SpO2: 100%   Filed Weights   02/14/22 0927  Weight: 135 lb (61.2 kg)    Physical Exam Vitals and nursing note reviewed.  HENT:     Head: Normocephalic and atraumatic.     Mouth/Throat:     Pharynx: Oropharynx is clear.  Eyes:     Extraocular Movements: Extraocular movements intact.     Pupils: Pupils are equal, round, and reactive to light.  Cardiovascular:     Rate and Rhythm: Normal rate and regular rhythm.  Pulmonary:     Comments: Decreased breath sounds bilaterally.  Abdominal:     Palpations: Abdomen is soft.  Musculoskeletal:        General: Normal range of motion.     Cervical back: Normal range of motion.  Skin:    General: Skin is warm.  Neurological:     General: No focal deficit present.     Mental Status: She is alert and oriented to person, place, and time.  Psychiatric:        Behavior: Behavior normal.         Judgment: Judgment normal.      LABORATORY DATA:  I have reviewed the data as listed Lab Results  Component Value Date   WBC 4.3 02/14/2022   HGB 8.8 (L) 02/14/2022   HCT 28.0 (L) 02/14/2022   MCV 97.9 02/14/2022   PLT 241 02/14/2022   Recent Labs    04/15/21 1549 05/01/21 0320 12/12/21 1119 01/10/22 0929 02/14/22 0906  NA  --    < > 136 139 137  K  --    < > 3.4* 3.8 3.5  CL  --    < > 106 107 105  CO2  --    < > _0 GLUCOSE  --    < > 176* 115* 125*  BUN  --    < > 21 20 25*  CREATININE  --    < > 1.11* 1.15* 1.06*  CALCIUM  --    < > 9.4 9.4 9.5  GFRNONAA  --    < > 49* 47* 51*  PROT  --    < > 6.9 6.6 6.2*  ALBUMIN 4.4   < > 3.8 3.8 3.8  AST 17   < > _1 ALT 8   < > _2 ALKPHOS 108   < > 70 66 59  BILITOT 0.3   < > 0.5 0.6 0.6  BILIDIR 0.10  --   --   --   --    < > =  values in this interval not displayed.    RADIOGRAPHIC STUDIES: I have personally reviewed the radiological images as listed and agreed with the findings in the report. No results found.  Multiple myeloma not having achieved remission ([place]) # STAGE I- MULTIPLE MYELOMA -Active [bone lesions; hypercalcemia; anemia; No renal insuffiencey;Bone marrow-32% plasma cell-   STANDARD Cytogenetics].  Currently on Revlimid-Dex- Dara SQ. Partial response noted; SEP 2023-M protein = 0.2 gm/fl; K/L= 1. 5.  STABLE.  # Proceed with Dara SQ today; Labs today reviewed;  acceptable for treatment today. On Revlimid at 10 mg dose 3 weeks on 1 week off Decrease dex to 4 mg weekly [reflux like symtoms].   STABLE.   #Reflux-like symptoms- ?  Secondary to dexamethasone.  Continue low-dose 4 mg weekly continue PPI. STABLE.  # Anemia- Hb 9; JUNE 2023- Iron sat-13; Ferritin- 66. Trial of gentle iron. Proceed with venofer today  # Diarrhea- improved on Revlimid 10 mg a day- 3 weeks-ON & 1 week OFF.  STABLE.  # Diabetes-on oral hypoglycemic agents; on long acting insulin; 14 units of lantus on  T/W/Thursday- Defer to PCP.PBF- [id number]  Again recommend inform PCP regarding blood sugars log.STABLE  # HTN: continue losartan; continue coreg; BP- 150-160- refilled coreg; STABLE  # Hypercalcemia secondary to multiple myeloma- [FEB 2023-Zometa ; calcitonin]-calcium today is 9.6. Zometa q 4W. STABLE  # Weight loss-s/p re-veluation with [place] -STABLE  # Bil LE swelling- recommend compression stockings; clinically not suggestive of CHF- stable.   #DVT/shingles prophylaxis: Aspirin/acyclovir.STABLE  # Vaccinations; Ok with flu/COVID shot  # IV access:PIV   [name]  zometa-q 3 months [next March 2024]- ; MM; K/L q 4 W  # DISPOSITION:  # proceed with  dara SQ; venofer; Zometa  # follow up in 4 weeks/Friday  MD; labs- cbc/cmp MM panel;K/L light chains; - ;Dara SQ; possible venofer- ;Dr.B       All questions were answered. The patient knows to call the clinic with any problems, questions or concerns.       Cammie Sickle, MD 02/14/2022 12:15 PM

## 2022-02-17 LAB — KAPPA/LAMBDA LIGHT CHAINS
Kappa free light chain: 15.8 mg/L (ref 3.3–19.4)
Kappa, lambda light chain ratio: 1.58 (ref 0.26–1.65)
Lambda free light chains: 10 mg/L (ref 5.7–26.3)

## 2022-02-18 LAB — MULTIPLE MYELOMA PANEL, SERUM
Albumin SerPl Elph-Mcnc: 3.6 g/dL (ref 2.9–4.4)
Albumin/Glob SerPl: 1.7 (ref 0.7–1.7)
Alpha 1: 0.2 g/dL (ref 0.0–0.4)
Alpha2 Glob SerPl Elph-Mcnc: 0.7 g/dL (ref 0.4–1.0)
B-Globulin SerPl Elph-Mcnc: 0.9 g/dL (ref 0.7–1.3)
Gamma Glob SerPl Elph-Mcnc: 0.4 g/dL (ref 0.4–1.8)
Globulin, Total: 2.2 g/dL (ref 2.2–3.9)
IgA: 77 mg/dL (ref 64–422)
IgG (Immunoglobin G), Serum: 573 mg/dL — ABNORMAL LOW (ref 586–1602)
IgM (Immunoglobulin M), Srm: 21 mg/dL — ABNORMAL LOW (ref 26–217)
Total Protein ELP: 5.8 g/dL — ABNORMAL LOW (ref 6.0–8.5)

## 2022-02-19 ENCOUNTER — Other Ambulatory Visit: Payer: Self-pay | Admitting: Family Medicine

## 2022-02-19 DIAGNOSIS — I10 Essential (primary) hypertension: Secondary | ICD-10-CM

## 2022-02-19 DIAGNOSIS — E119 Type 2 diabetes mellitus without complications: Secondary | ICD-10-CM

## 2022-02-20 MED FILL — Iron Sucrose Inj 20 MG/ML (Fe Equiv): INTRAVENOUS | Qty: 10 | Status: AC

## 2022-02-21 ENCOUNTER — Inpatient Hospital Stay: Payer: Medicare Other

## 2022-02-21 NOTE — Progress Notes (Signed)
Nutrition Follow-up:  Called and spoke with Daughter, [name] (highlighted number).  She encouraged RD to call patient on home number.  Called but unable to reach patient.  Will follow-up on 12/29 next infusion.  Monik Lins B. [name], [place], [place] Registered Dietitian [phone]

## 2022-03-05 ENCOUNTER — Encounter: Payer: Self-pay | Admitting: Family Medicine

## 2022-03-06 ENCOUNTER — Other Ambulatory Visit: Payer: Self-pay

## 2022-03-06 DIAGNOSIS — Z794 Long term (current) use of insulin: Secondary | ICD-10-CM | POA: Diagnosis not present

## 2022-03-06 DIAGNOSIS — E119 Type 2 diabetes mellitus without complications: Secondary | ICD-10-CM

## 2022-03-06 DIAGNOSIS — E113393 Type 2 diabetes mellitus with moderate nonproliferative diabetic retinopathy without macular edema, bilateral: Secondary | ICD-10-CM | POA: Diagnosis not present

## 2022-03-06 NOTE — Progress Notes (Signed)
Printed labs

## 2022-03-07 LAB — HEMOGLOBIN A1C
Est. average glucose Bld gHb Est-mCnc: 163 mg/dL
Hgb A1c MFr Bld: 7.3 % — ABNORMAL HIGH (ref 4.8–5.6)

## 2022-03-07 LAB — MICROALBUMIN / CREATININE URINE RATIO
Creatinine, Urine: 249.3 mg/dL
Microalb/Creat Ratio: 105 mg/g creat — ABNORMAL HIGH (ref 0–29)
Microalbumin, Urine: 262.7 ug/mL

## 2022-03-11 ENCOUNTER — Other Ambulatory Visit: Payer: Self-pay

## 2022-03-11 ENCOUNTER — Ambulatory Visit: Payer: Self-pay

## 2022-03-11 DIAGNOSIS — Z794 Long term (current) use of insulin: Secondary | ICD-10-CM

## 2022-03-11 MED ORDER — LANTUS SOLOSTAR 100 UNIT/ML ~~LOC~~ SOPN: 10.0000 [IU] | PEN_INJECTOR | Freq: Every day | SUBCUTANEOUS | 0 refills | Status: DC

## 2022-03-11 NOTE — Telephone Encounter (Signed)
Chief Complaint: Questions about dosage of lantus Symptoms: N/A Frequency: N/A Pertinent Negatives: Patient denies N/A Disposition: '[]'$ ED /'[]'$ Urgent Care (no appt availability in office) / '[]'$ Appointment(In office/virtual)/ '[]'$  [place] [facility]/ '[]'$ Home Care/ '[]'$ Refused Recommended Disposition /'[]'$ [facility]/ '[x]'$  Follow-up with PCP Additional Notes: Destiny, Pharmacy Technician needs clarification on how much insulin the patient takes. Rx received today indicates 10 units daily, 6 units everyday except Wed and Thurs-8 units. The will need to know what is the actual dosage? Advised I will send this to Dr. Ronnald Ramp for review and someone will call or send a new Rx in.    ummary: [place] requests call back for clarification of the Rx   Destiny with [facility] requests call back for clarification of the Rx for insulin glargine (LANTUS SOLOSTAR) 100 UNIT/ML Solostar Pen.     Reason for Disposition . [1] Caller has NON-URGENT medicine question about med that PCP prescribed AND [2] triager unable to answer question  Answer Assessment - Initial Assessment Questions 1. NAME of MEDICINE: "What medicine(s) are you calling about?"     Lantus Solostar 2. QUESTION: "What is your question?" (e.g., double dose of medicine, side effect)     How many units using per day 3. PRESCRIBER: "Who prescribed the medicine?" Reason: if prescribed by specialist, call should be referred to that group.     Dr. Ronnald Ramp  Protocols used: Medication Question Call-A-AH

## 2022-03-14 ENCOUNTER — Other Ambulatory Visit: Payer: Self-pay

## 2022-03-14 ENCOUNTER — Other Ambulatory Visit: Payer: Self-pay | Admitting: Family Medicine

## 2022-03-14 ENCOUNTER — Inpatient Hospital Stay: Payer: Medicare Other

## 2022-03-14 ENCOUNTER — Encounter: Payer: Self-pay | Admitting: Internal Medicine

## 2022-03-14 ENCOUNTER — Inpatient Hospital Stay (HOSPITAL_BASED_OUTPATIENT_CLINIC_OR_DEPARTMENT_OTHER): Payer: Medicare Other | Admitting: Internal Medicine

## 2022-03-14 VITALS — BP 137/57 | HR 59 | Temp 97.1°F | Resp 18 | Ht 66.0 in | Wt 125.1 lb

## 2022-03-14 VITALS — BP 146/55 | HR 65

## 2022-03-14 DIAGNOSIS — C9000 Multiple myeloma not having achieved remission: Secondary | ICD-10-CM

## 2022-03-14 DIAGNOSIS — I10 Essential (primary) hypertension: Secondary | ICD-10-CM | POA: Diagnosis not present

## 2022-03-14 DIAGNOSIS — I959 Hypotension, unspecified: Secondary | ICD-10-CM | POA: Diagnosis not present

## 2022-03-14 DIAGNOSIS — K219 Gastro-esophageal reflux disease without esophagitis: Secondary | ICD-10-CM

## 2022-03-14 DIAGNOSIS — Z5112 Encounter for antineoplastic immunotherapy: Secondary | ICD-10-CM | POA: Diagnosis not present

## 2022-03-14 DIAGNOSIS — D630 Anemia in neoplastic disease: Secondary | ICD-10-CM | POA: Diagnosis not present

## 2022-03-14 LAB — CBC WITH DIFFERENTIAL/PLATELET
Abs Immature Granulocytes: 0.01 10*3/uL (ref 0.00–0.07)
Basophils Absolute: 0 10*3/uL (ref 0.0–0.1)
Basophils Relative: 1 %
Eosinophils Absolute: 0.1 10*3/uL (ref 0.0–0.5)
Eosinophils Relative: 3 %
HCT: 26.9 % — ABNORMAL LOW (ref 36.0–46.0)
Hemoglobin: 8.6 g/dL — ABNORMAL LOW (ref 12.0–15.0)
Immature Granulocytes: 0 %
Lymphocytes Relative: 42 %
Lymphs Abs: 1.7 10*3/uL (ref 0.7–4.0)
MCH: 30.7 pg (ref 26.0–34.0)
MCHC: 32 g/dL (ref 30.0–36.0)
MCV: 96.1 fL (ref 80.0–100.0)
Monocytes Absolute: 0.6 10*3/uL (ref 0.1–1.0)
Monocytes Relative: 14 %
Neutro Abs: 1.6 10*3/uL — ABNORMAL LOW (ref 1.7–7.7)
Neutrophils Relative %: 40 %
Platelets: 291 10*3/uL (ref 150–400)
RBC: 2.8 MIL/uL — ABNORMAL LOW (ref 3.87–5.11)
RDW: 14.6 % (ref 11.5–15.5)
WBC: 4 10*3/uL (ref 4.0–10.5)
nRBC: 0 % (ref 0.0–0.2)

## 2022-03-14 LAB — COMPREHENSIVE METABOLIC PANEL
ALT: 22 U/L (ref 0–44)
AST: 26 U/L (ref 15–41)
Albumin: 3.4 g/dL — ABNORMAL LOW (ref 3.5–5.0)
Alkaline Phosphatase: 55 U/L (ref 38–126)
Anion gap: 7 (ref 5–15)
BUN: 20 mg/dL (ref 8–23)
CO2: 25 mmol/L (ref 22–32)
Calcium: 9.4 mg/dL (ref 8.9–10.3)
Chloride: 107 mmol/L (ref 98–111)
Creatinine, Ser: 1.18 mg/dL — ABNORMAL HIGH (ref 0.44–1.00)
GFR, Estimated: 45 mL/min — ABNORMAL LOW (ref >60)
Glucose, Bld: 105 mg/dL — ABNORMAL HIGH (ref 70–99)
Potassium: 3.6 mmol/L (ref 3.5–5.1)
Sodium: 139 mmol/L (ref 135–145)
Total Bilirubin: 0.4 mg/dL (ref 0.3–1.2)
Total Protein: 6.3 g/dL — ABNORMAL LOW (ref 6.5–8.1)

## 2022-03-14 MED ORDER — ACETAMINOPHEN 325 MG PO TABS
650.0000 mg | ORAL_TABLET | Freq: Once | ORAL | Status: AC
  Administered 2022-03-14: 650 mg via ORAL
  Filled 2022-03-14: qty 2

## 2022-03-14 MED ORDER — SODIUM CHLORIDE 0.9 % IV SOLN
Freq: Once | INTRAVENOUS | Status: AC
  Filled 2022-03-14: qty 250

## 2022-03-14 MED ORDER — DEXAMETHASONE 4 MG PO TABS
20.0000 mg | ORAL_TABLET | Freq: Once | ORAL | Status: AC
  Administered 2022-03-14: 20 mg via ORAL
  Filled 2022-03-14: qty 5

## 2022-03-14 MED ORDER — DARATUMUMAB-HYALURONIDASE-FIHJ 1800-30000 MG-UT/15ML ~~LOC~~ SOLN
1800.0000 mg | Freq: Once | SUBCUTANEOUS | Status: AC
  Administered 2022-03-14: 1800 mg via SUBCUTANEOUS
  Filled 2022-03-14: qty 15

## 2022-03-14 MED ORDER — SODIUM CHLORIDE 0.9 % IV SOLN
200.0000 mg | INTRAVENOUS | Status: DC
  Administered 2022-03-14: 200 mg via INTRAVENOUS
  Filled 2022-03-14: qty 200

## 2022-03-14 MED ORDER — DIPHENHYDRAMINE HCL 25 MG PO CAPS
50.0000 mg | ORAL_CAPSULE | Freq: Once | ORAL | Status: AC
  Administered 2022-03-14: 50 mg via ORAL
  Filled 2022-03-14: qty 2

## 2022-03-14 NOTE — Progress Notes (Signed)
[place] NOTE  Patient Care Team: Juline Patch, MD as PCP - General (Family Medicine) Vladimir Faster, [facility] (Inactive) (Pharmacist) Cammie Sickle, MD as Consulting Physician (Oncology)  CHIEF COMPLAINTS/PURPOSE OF CONSULTATION: Multiple myeloma  Oncology History Overview Note  # MULTIPLE MYELOMA  [FEB 2023-hypercalcemia status changes]-Active [bone lesions; hypercalcemia; anemia; No renal insuffiencey;Bone marrow-32% plasma cell-   STANDARD Cytogenetics].FEB 2023- S/p dexamethasone 20 mg q day x4.   # REV [15 mg -3 week-On and 1 week-OFF]-DARA; May 2023-discontinued rev 15 diarrhea/hypotension  # MAY 2023- dara-Rev 10 mg 3w/1w   # FEB 2023-hypercalcemia [[facility]-status post calcitonin; bisphosphonate]  BONE MARROW, ASPIRATE, CLOT, CORE:  -Hypercellular bone marrow with plasma cell neoplasm  -See comment   PERIPHERAL BLOOD:  -Normocytic-normochromic anemia   COMMENT:   The bone marrow is hypercellular for age with increased number of  atypical plasma cells representing 32% of all cells in the aspirate  associated with interstitial infiltrates and numerous variably sized  clusters in the clot/biopsy sections.  The plasma cells display weak  kappa light chain restriction consistent with plasma cell neoplasm.  Correlation with cytogenetic and FISH studies is recommended   MICROSCOPIC DESCRIPTION:   PERIPHERAL BLOOD SMEAR: The red blood cells display mild  anisopoikilocytosis with mild polychromasia.  The white blood cells are  normal number with scattered hypogranular neutrophils. An occasional  myelocyte and large atypical mononuclear cells are seen on scan. The  platelets are normal in number.    Multiple myeloma not having achieved remission ([place])  05/15/2021 Initial Diagnosis   Multiple myeloma not having achieved remission ([place])   06/27/2021 - 10/17/2021 Chemotherapy   Patient is on Treatment Plan : MYELOMA Daratumumab SQ + Lenalidomide +  Dexamethasone (DaraRd) q28d     06/27/2021 -  Chemotherapy   Patient is on Treatment Plan : MYELOMA RELAPSED REFRACTORY Daratumumab SQ + Lenalidomide + Dexamethasone (DaraRd) q28d     07/11/2021 Cancer Staging   Staging form: Plasma Cell Myeloma and Plasma Cell Disorders, AJCC 8th Edition - Clinical: Beta-2-microglobulin (mg/L): 2.6, Albumin (g/dL): 3.9, ISS: Stage I - Signed by Cammie Sickle, MD on 07/11/2021 Stage prefix: Initial diagnosis Beta 2 microglobulin range (mg/L): Less than 3.5 Albumin range (g/dL): Greater than or equal to 3.5     HISTORY OF PRESENTING ILLNESS: pt in a wheel chair; accompanied by her daughter.   Cristina Morgan 87 y.o.  female with  multiple myeloma currently on Rev-Dex is proceed with Dara SQ today.    Patient complains of worsening nightly indigestion and taking Protonix.   Also noted to have runny nose/cough -is taking Coricidin for the past 2 weeks. Improving not resolved. No phlegm.  lost about 10 pounds since the last visit.   Diarrhea well controlled with last loose stool last week improved with Lomotil.   Continues to have ongoing fatigue. Started on PO iron 1-2 weeks ago.  Denies any nausea vomiting.  Denies any blood in stools or black-colored stools.   Patient states her back pain is improved.  Denies any worsening joint pains.  No falls.  No further episodes of mental status changes.    Review of Systems  Constitutional:  Positive for malaise/fatigue and weight loss. Negative for chills, diaphoresis and fever.  HENT:  Negative for nosebleeds and sore throat.   Eyes:  Negative for double vision.  Respiratory:  Negative for cough, hemoptysis, sputum production, shortness of breath and wheezing.   Cardiovascular:  Negative for chest pain,  palpitations, orthopnea and leg swelling.  Gastrointestinal:  Negative for abdominal pain, blood in stool, constipation, diarrhea, heartburn, melena, nausea and vomiting.  Genitourinary:  Negative for  dysuria, frequency and urgency.  Musculoskeletal:  Positive for back pain and joint pain.  Skin: Negative.  Negative for itching and rash.  Neurological:  Negative for dizziness, tingling, focal weakness, weakness and headaches.  Endo/Heme/Allergies:  Does not bruise/bleed easily.  Psychiatric/Behavioral:  Negative for depression. The patient is not nervous/anxious and does not have insomnia.      MEDICAL HISTORY:  Past Medical History:  Diagnosis Date   Anemia    CAD (coronary artery disease)    Cataracts, bilateral    Closed compression fracture of first lumbar vertebra (HCC)    Closed compression fracture of second lumbar vertebra (HCC)    Diabetes mellitus without complication (HCC)    High cholesterol    History of pneumonia 2010   [place]   History of uterine fibroid    Hypercalcemia    Hyperlipidemia    Hypertension    Lytic bone lesions on xray    Multiple myeloma (HCC)    Osteoporosis    Primary osteoarthritis of right knee     SURGICAL HISTORY: Past Surgical History:  Procedure Laterality Date   CATARACT EXTRACTION Bilateral 2014   COLONOSCOPY  2011   cleared for 5 yrs- [facility]   ECTOPIC PREGNANCY SURGERY      SOCIAL HISTORY: Social History   Socioeconomic History   Marital status: Married    Spouse name: Not on file   Number of children: 2   Years of education: Not on file   Highest education level: 12th grade  Occupational History   Occupation: Retired  Tobacco Use   Smoking status: Never    Passive exposure: Never   Smokeless tobacco: Never   Tobacco comments:    smoking cessation materials not required  Vaping Use   Vaping Use: Never used  Substance and Sexual Activity   Alcohol use: No   Drug use: No   Sexual activity: Not Currently  Other Topics Concern   Not on file  Social History Narrative   [place] with husband; drives. Never smoked; no alcohol. Daughters live close.    Social Determinants of Health   Financial Resource  Strain: Low Risk  (11/13/2021)   Overall Financial Resource Strain (CARDIA)    Difficulty of Paying Living Expenses: Not hard at all  Food Insecurity: No Food Insecurity (11/13/2021)   Hunger Vital Sign    Worried About Running Out of Food in the Last Year: Never true    Ran Out of Food in the Last Year: Never true  Transportation Needs: No Transportation Needs (11/13/2021)   PRAPARE - Hydrologist (Medical): No    Lack of Transportation (Non-Medical): No  Physical Activity: Sufficiently Active (11/13/2021)   Exercise Vital Sign    Days of Exercise per Week: 7 days    Minutes of Exercise per Session: 30 min  Stress: No Stress Concern Present (11/13/2021)   [place]    Feeling of Stress : Not at all  Social Connections: Moderately Integrated (11/13/2021)   Social Connection and Isolation Panel [NHANES]    Frequency of Communication with Friends and Family: Twice a week    Frequency of Social Gatherings with Friends and Family: More than three times a week    Attends Religious Services: 1 to 4 times per  year    Active Member of Clubs or Organizations: No    Attends Archivist Meetings: Never    Marital Status: Married  Human resources officer Violence: Not At Risk (11/13/2021)   Humiliation, Afraid, Rape, and Kick questionnaire    Fear of Current or Ex-Partner: No    Emotionally Abused: No    Physically Abused: No    Sexually Abused: No    FAMILY HISTORY: Family History  Problem Relation Age of Onset   Cancer Mother        breast   Heart disease Mother    Diabetes Father    Heart disease Father    Heart attack Father    Heart attack Brother    Diabetes Brother     ALLERGIES:  is allergic to latex and penicillins.  MEDICATIONS:  Current Outpatient Medications  Medication Sig Dispense Refill   ACCU-CHEK GUIDE test strip USE 1 STRIP TO CHECK GLUCOSE ONCE DAILY AS DIRECTED 100  each 0   Accu-Chek Softclix Lancets lancets USE 1  TO CHECK GLUCOSE ONCE DAILY 100 each 0   acetaminophen (TYLENOL) 325 MG tablet Take 325 mg by mouth 2 (two) times daily. START 2 days prior to chemo infusion. Take For 2 days; Do NOT take on the day of infusion     acyclovir (ZOVIRAX) 400 MG tablet Take 1 tablet (400 mg total) by mouth 2 (two) times daily. 60 tablet 4   BD PEN NEEDLE NANO 2ND GEN 32G X 4 MM MISC USE 1  ONCE DAILY 100 each 0   Blood Glucose Monitoring Suppl (ACCU-CHEK GUIDE) w/Device KIT USE AS DIRECTED TO CHECK GLUCOSE     Calcium Carbonate-Vit D-Min (CALCIUM 1200 PO) Take 1 capsule by mouth daily.     carvedilol (COREG) 3.125 MG tablet TAKE ONE TABLET BY MOUTH TWICE DAILY with meals 60 tablet 1   cholecalciferol (VITAMIN D3) 25 MCG (1000 UNIT) tablet Take 1,000 Units by mouth daily.     dexamethasone (DECADRON) 4 MG tablet Take 1 tablet (4 mg total) by mouth 2 (two) times daily. Start 2 days prior to infusion; Take it for 2 days. 60 tablet 3   diphenoxylate-atropine (LOMOTIL) 2.5-0.025 MG tablet Take 1 tablet by mouth 4 (four) times daily as needed for diarrhea or loose stools. Take it along with immodium 60 tablet 0   Glucerna (GLUCERNA) LIQD Take 237 mLs by mouth.     hydrOXYzine (ATARAX) 10 MG tablet Take 1 tablet (10 mg total) by mouth at bedtime as needed. 90 tablet 1   insulin glargine (LANTUS SOLOSTAR) 100 UNIT/ML Solostar Pen Inject 10 Units into the skin daily. 6 units every day except Wed and Thursday- 8 units 15 mL 0   lenalidomide (REVLIMID) 10 MG capsule Take 1 capsule (10 mg total) by mouth daily. Take for 21 days, then hold for 7 days. Repeat every 28 days. 21 capsule 0   losartan (COZAAR) 25 MG tablet TAKE ONE TABLET BY MOUTH EVERY EVENING 90 tablet 0   lovastatin (MEVACOR) 40 MG tablet Take 1 tablet (40 mg total) by mouth every evening. 90 tablet 1   metFORMIN (GLUCOPHAGE) 1000 MG tablet Take 1 tablet (1,000 mg total) by mouth 2 (two) times daily. 180 tablet 1    montelukast (SINGULAIR) 10 MG tablet Take ONE tablet by MOUTH daily. START TWO DAYS prior TO chemo infusion. Take FOR TWO DAYS; DO NOT take ON THE DAY of infusion. 20 tablet 1   ondansetron (ZOFRAN) 8 MG tablet  One pill every 8 hours as needed for nausea/vomitting. 40 tablet 1   pantoprazole (PROTONIX) 40 MG tablet Take 1 tablet (40 mg total) by mouth daily. 90 tablet 1   prochlorperazine (COMPAZINE) 10 MG tablet Take 1 tablet (10 mg total) by mouth every 6 (six) hours as needed for nausea or vomiting. 40 tablet 1   sertraline (ZOLOFT) 25 MG tablet TAKE ONE TABLET BY MOUTH ONCE DAILY 90 tablet 1   triamcinolone (NASACORT) 55 MCG/ACT AERO nasal inhaler 2 sprays 2 (two) times daily.     No current facility-administered medications for this visit.      Marland Kitchen  PHYSICAL EXAMINATION:  Vitals:   03/14/22 1100  BP: (!) 137/57  Pulse: (!) 59  Resp: 18  Temp: (!) 97.1 F (36.2 C)    Filed Weights   03/14/22 1100  Weight: 125 lb 1.6 oz (56.7 kg)     Physical Exam Vitals and nursing note reviewed.  HENT:     Head: Normocephalic and atraumatic.     Mouth/Throat:     Pharynx: Oropharynx is clear.  Eyes:     Extraocular Movements: Extraocular movements intact.     Pupils: Pupils are equal, round, and reactive to light.  Cardiovascular:     Rate and Rhythm: Normal rate and regular rhythm.  Pulmonary:     Comments: Decreased breath sounds bilaterally.  Abdominal:     Palpations: Abdomen is soft.  Musculoskeletal:        General: Normal range of motion.     Cervical back: Normal range of motion.  Skin:    General: Skin is warm.  Neurological:     General: No focal deficit present.     Mental Status: She is alert and oriented to person, place, and time.  Psychiatric:        Behavior: Behavior normal.        Judgment: Judgment normal.      LABORATORY DATA:  I have reviewed the data as listed Lab Results  Component Value Date   WBC 4.0 03/14/2022   HGB 8.6 (L) 03/14/2022    HCT 26.9 (L) 03/14/2022   MCV 96.1 03/14/2022   PLT 291 03/14/2022   Recent Labs    04/15/21 1549 05/01/21 0320 01/10/22 0929 02/14/22 0906 03/14/22 1102  NA  --    < > 139 137 139  K  --    < > 3.8 3.5 3.6  CL  --    < > 107 105 107  CO2  --    < > _0 GLUCOSE  --    < > 115* 125* 105*  BUN  --    < > 20 25* 20  CREATININE  --    < > 1.15* 1.06* 1.18*  CALCIUM  --    < > 9.4 9.5 9.4  GFRNONAA  --    < > 47* 51* 45*  PROT  --    < > 6.6 6.2* 6.3*  ALBUMIN 4.4   < > 3.8 3.8 3.4*  AST 17   < > _1 ALT 8   < > _2 ALKPHOS 108   < > 66 59 55  BILITOT 0.3   < > 0.6 0.6 0.4  BILIDIR 0.10  --   --   --   --    < > = values in this interval not displayed.    RADIOGRAPHIC STUDIES: I have personally reviewed the radiological  images as listed and agreed with the findings in the report. No results found.  Multiple myeloma not having achieved remission ([place]) # STAGE I- MULTIPLE MYELOMA -Active [bone lesions; hypercalcemia; anemia; No renal insuffiencey;Bone marrow-32% plasma cell-   STANDARD Cytogenetics].  Currently on Revlimid-Dex- Dara SQ. Partial response noted; NOV  2023-M protein = ABSENT  K/L RATIO= 1. 5= WNL;  STABLE.  # Proceed with Dara SQ today; Labs today reviewed;  acceptable for treatment today. On Revlimid at 10 mg dose 3 weeks on 1 week off ; HOLD dex t4 mg weekly [reflux like sypmtoms- see below].   STABLE.   #Reflux-like symptoms- ?  Secondary to dexamethasone.   Currently on low-dose 4 mg weekly/WED. HOLD Dex; and refer to GI ASAP- esp; given worsening anemia; 10 pound weight loss in the last 1 month  # URI-improved as per patient.  Monitor closely as patient is immunosuppressed.  # Anemia- Hb 8.19 Feb 2022- Iron sat-11 Ferritin- 26; continue trial of gentle iron. Proceed with venofer today  # Diarrhea- improved on Revlimid 10 mg a day- 3 weeks-ON & 1 week OFF.  STABLE.  # Diabetes-on oral hypoglycemic agents; on long acting insulin; 14 units of  lantus on T/W/Thursday- Defer to PCP.PBF- BG-106; STABLE   # HTN: continue losartan; continue coreg; BP- 150-160- refilled coreg; STABLE  # Hypercalcemia secondary to multiple myeloma- [FEB 2023-Zometa ; calcitonin]-calcium today is 9.6. Zometa q 4W. STABLE  # Weight loss-s/p re-veluation with Joli -STABLE  #DVT/shingles prophylaxis: Aspirin/acyclovir.STABLE  # Vaccinations; Ok with flu/COVID shot  # IV access:PIV   [name]  zometa-q 3 months [next March 2024]- ; MM; K/L q 4 W  # DISPOSITION:  # proceed with  dara SQ; venofer;   # in 2 weeks- venofer   # referral to [facility] re: worsening anemia/reflux ASAP  # follow up in 4 weeks/Friday  MD; labs- cbc/cmp MM panel;K/L light chains; - ;Dara SQ; possible venofer- ;Dr.B       All questions were answered. The patient knows to call the clinic with any problems, questions or concerns.       Cammie Sickle, MD 03/14/2022 12:05 PM

## 2022-03-14 NOTE — Patient Instructions (Signed)
[facility] CANCER CTR AT [place]  Discharge Instructions: Thank you for choosing [facility] to provide your oncology and hematology care.  If you have a lab appointment with the [facility], please go directly to the [facility] and check in at the registration area.  Wear comfortable clothing and clothing appropriate for easy access to any Portacath or PICC line.   We strive to give you quality time with your provider. You may need to reschedule your appointment if you arrive late (15 or more minutes).  Arriving late affects you and other patients whose appointments are after yours.  Also, if you miss three or more appointments without notifying the office, you may be dismissed from the clinic at the provider's discretion.      For prescription refill requests, have your pharmacy contact our office and allow 72 hours for refills to be completed.    Today you received the following chemotherapy and/or immunotherapy agents:daratumumab     To help prevent nausea and vomiting after your treatment, we encourage you to take your nausea medication as directed.  BELOW ARE SYMPTOMS THAT SHOULD BE REPORTED IMMEDIATELY: *FEVER GREATER THAN 100.4 F (38 C) OR HIGHER *CHILLS OR SWEATING *NAUSEA AND VOMITING THAT IS NOT CONTROLLED WITH YOUR NAUSEA MEDICATION *UNUSUAL SHORTNESS OF BREATH *UNUSUAL BRUISING OR BLEEDING *URINARY PROBLEMS (pain or burning when urinating, or frequent urination) *BOWEL PROBLEMS (unusual diarrhea, constipation, pain near the anus) TENDERNESS IN MOUTH AND THROAT WITH OR WITHOUT PRESENCE OF ULCERS (sore throat, sores in mouth, or a toothache) UNUSUAL RASH, SWELLING OR PAIN  UNUSUAL VAGINAL DISCHARGE OR ITCHING   Items with * indicate a potential emergency and should be followed up as soon as possible or go to the Emergency Department if any problems should occur.  Please show the CHEMOTHERAPY ALERT CARD or IMMUNOTHERAPY ALERT CARD at check-in to  the Emergency Department and triage nurse.  Should you have questions after your visit or need to cancel or reschedule your appointment, please contact [facility] CANCER [place] AT [place]  [phone] and follow the prompts.  Office hours are 8:00 a.m. to 4:30 p.m. Monday - Friday. Please note that voicemails left after 4:00 p.m. may not be returned until the following business day.  We are closed weekends and major holidays. You have access to a nurse at all times for urgent questions. Please call the main number to the clinic [phone] and follow the prompts.  For any non-urgent questions, you may also contact your provider using MyChart. We now offer e-Visits for anyone 77 and older to request care online for non-urgent symptoms. For details visit [email or web address].   Also download the MyChart app! Go to the app store, search "MyChart", open the app, select [facility], and log in with your MyChart username and password.

## 2022-03-14 NOTE — Progress Notes (Signed)
Worsening nightly indigestion and taking Protonix.  Is taking Coricidin for the past 2 weeks for cough and runny nose.  Diarrhea well controlled with last loose stool last week improved with Lomotil.

## 2022-03-14 NOTE — Assessment & Plan Note (Addendum)
#  STAGE I- MULTIPLE MYELOMA -Active [bone lesions; hypercalcemia; anemia; No renal insuffiencey;Bone marrow-32% plasma cell-   STANDARD Cytogenetics].  Currently on Revlimid-Dex- Dara SQ. Partial response noted; NOV  2023-M protein = ABSENT  K/L RATIO= 1. 5= WNL;  STABLE.  # Proceed with Dara SQ today; Labs today reviewed;  acceptable for treatment today. On Revlimid at 10 mg dose 3 weeks on 1 week off ; HOLD dex t4 mg weekly [reflux like sypmtoms- see below].   STABLE.   #Reflux-like symptoms- ?  Secondary to dexamethasone.   Currently on low-dose 4 mg weekly/WED. HOLD Dex; and refer to GI ASAP- esp; given worsening anemia; 10 pound weight loss in the last 1 month  # URI-improved as per patient.  Monitor closely as patient is immunosuppressed.  # Anemia- Hb 8.19 Feb 2022- Iron sat-11 Ferritin- 26; continue trial of gentle iron. Proceed with venofer today  # Diarrhea- improved on Revlimid 10 mg a day- 3 weeks-ON & 1 week OFF.  STABLE.  # Diabetes-on oral hypoglycemic agents; on long acting insulin; 14 units of lantus on T/W/Thursday- Defer to PCP.PBF- BG-106; STABLE   # HTN: continue losartan; continue coreg; BP- 150-160- refilled coreg; STABLE  # Hypercalcemia secondary to multiple myeloma- [FEB 2023-Zometa ; calcitonin]-calcium today is 9.6. Zometa q 4W. STABLE  # Weight loss-s/p re-veluation with Joli -STABLE  #DVT/shingles prophylaxis: Aspirin/acyclovir.STABLE  # Vaccinations; Ok with flu/COVID shot  # IV access:PIV   [name]  zometa-q 3 months [next March 2024]- ; MM; K/L q 4 W  # DISPOSITION:  # proceed with  dara SQ; venofer;   # in 2 weeks- venofer   # referral to [facility] re: worsening anemia/reflux ASAP  # follow up in 4 weeks/Friday  MD; labs- cbc/cmp MM panel;K/L light chains; - ;Dara SQ; possible venofer- ;Dr.B

## 2022-03-14 NOTE — Progress Notes (Signed)
Nutrition  Patient finished up in infusion prior to RD being able to see today.  Will see at next infusion on 1/11  Shubham Thackston B. [name], [place], [place] Registered Dietitian [phone]

## 2022-03-15 NOTE — Telephone Encounter (Signed)
Requested Prescriptions  Pending Prescriptions Disp Refills   pantoprazole (PROTONIX) 40 MG tablet [Pharmacy Med Name: Pantoprazole Sodium 40 MG Oral Tablet Delayed Release] 90 tablet 0    Sig: Take 1 tablet by mouth once daily     Gastroenterology: Proton Pump Inhibitors Passed - 03/14/2022  5:25 PM      Passed - Valid encounter within last 12 months    Recent Outpatient Visits           2 months ago Type 2 diabetes mellitus without complication, with long-term current use of insulin ([place])   [facility], MD   6 months ago Type 2 diabetes mellitus with other specified complication, with long-term current use of insulin ([place])   [facility] at [facility], MD   7 months ago Type 2 diabetes mellitus without complication, without long-term current use of insulin ([place])   [facility] at [facility], MD   8 months ago Type 2 diabetes mellitus without complication, without long-term current use of insulin ([place])   [facility] at [facility], MD   9 months ago Type 2 diabetes mellitus without complication, without long-term current use of insulin ([place])   [facility] [place], [place], MD       Future Appointments             In 1 month Juline Patch, MD [place] and Sports Medicine at [facility], [facility]

## 2022-03-17 ENCOUNTER — Other Ambulatory Visit: Payer: Self-pay | Admitting: Internal Medicine

## 2022-03-17 DIAGNOSIS — I10 Essential (primary) hypertension: Secondary | ICD-10-CM

## 2022-03-18 ENCOUNTER — Telehealth: Payer: Self-pay | Admitting: Pharmacist

## 2022-03-18 ENCOUNTER — Encounter: Payer: Self-pay | Admitting: Internal Medicine

## 2022-03-18 ENCOUNTER — Other Ambulatory Visit (HOSPITAL_COMMUNITY): Payer: Self-pay

## 2022-03-18 DIAGNOSIS — C9000 Multiple myeloma not having achieved remission: Secondary | ICD-10-CM

## 2022-03-18 LAB — KAPPA/LAMBDA LIGHT CHAINS
Kappa free light chain: 23.7 mg/L — ABNORMAL HIGH (ref 3.3–19.4)
Kappa, lambda light chain ratio: 1.1 (ref 0.26–1.65)
Lambda free light chains: 21.6 mg/L (ref 5.7–26.3)

## 2022-03-18 MED ORDER — LENALIDOMIDE 10 MG PO CAPS: 10.0000 mg | ORAL_CAPSULE | Freq: Every day | ORAL | 0 refills | Status: DC

## 2022-03-18 NOTE — Telephone Encounter (Signed)
Patient will be transitioning from mfg assistance to filling a [facility]. Rx sent to [facility] and supportive information sent to [facility].

## 2022-03-20 LAB — MULTIPLE MYELOMA PANEL, SERUM
Albumin SerPl Elph-Mcnc: 3.2 g/dL (ref 2.9–4.4)
Albumin/Glob SerPl: 1.3 (ref 0.7–1.7)
Alpha 1: 0.2 g/dL (ref 0.0–0.4)
Alpha2 Glob SerPl Elph-Mcnc: 0.8 g/dL (ref 0.4–1.0)
B-Globulin SerPl Elph-Mcnc: 1 g/dL (ref 0.7–1.3)
Gamma Glob SerPl Elph-Mcnc: 0.5 g/dL (ref 0.4–1.8)
Globulin, Total: 2.5 g/dL (ref 2.2–3.9)
IgA: 98 mg/dL (ref 64–422)
IgG (Immunoglobin G), Serum: 578 mg/dL — ABNORMAL LOW (ref 586–1602)
IgM (Immunoglobulin M), Srm: 20 mg/dL — ABNORMAL LOW (ref 26–217)
M Protein SerPl Elph-Mcnc: 0.2 g/dL — ABNORMAL HIGH
Total Protein ELP: 5.7 g/dL — ABNORMAL LOW (ref 6.0–8.5)

## 2022-03-24 ENCOUNTER — Other Ambulatory Visit: Payer: Self-pay | Admitting: Internal Medicine

## 2022-03-25 NOTE — Telephone Encounter (Signed)
Per [facility] there were trying to reach out to patient/family to set-up delivery and have not been able to reach anyone.   Called patient's daughter Cristina Morgan and was able to reach her. She will return a call to [facility] to set-up delivery.

## 2022-03-26 MED FILL — Iron Sucrose Inj 20 MG/ML (Fe Equiv): INTRAVENOUS | Qty: 10 | Status: AC

## 2022-03-27 ENCOUNTER — Ambulatory Visit: Payer: Medicare Other | Admitting: Gastroenterology

## 2022-03-27 ENCOUNTER — Inpatient Hospital Stay: Payer: Medicare Other

## 2022-03-27 ENCOUNTER — Inpatient Hospital Stay: Payer: Medicare Other | Attending: Nurse Practitioner

## 2022-03-27 VITALS — BP 133/55 | HR 62 | Temp 99.1°F | Resp 18

## 2022-03-27 DIAGNOSIS — Z5112 Encounter for antineoplastic immunotherapy: Secondary | ICD-10-CM | POA: Diagnosis not present

## 2022-03-27 DIAGNOSIS — D649 Anemia, unspecified: Secondary | ICD-10-CM | POA: Diagnosis not present

## 2022-03-27 DIAGNOSIS — I10 Essential (primary) hypertension: Secondary | ICD-10-CM | POA: Insufficient documentation

## 2022-03-27 DIAGNOSIS — Z79899 Other long term (current) drug therapy: Secondary | ICD-10-CM | POA: Diagnosis not present

## 2022-03-27 DIAGNOSIS — C9000 Multiple myeloma not having achieved remission: Secondary | ICD-10-CM | POA: Diagnosis not present

## 2022-03-27 DIAGNOSIS — K219 Gastro-esophageal reflux disease without esophagitis: Secondary | ICD-10-CM | POA: Insufficient documentation

## 2022-03-27 DIAGNOSIS — E119 Type 2 diabetes mellitus without complications: Secondary | ICD-10-CM | POA: Insufficient documentation

## 2022-03-27 MED ORDER — SODIUM CHLORIDE 0.9 % IV SOLN
200.0000 mg | Freq: Once | INTRAVENOUS | Status: AC
  Administered 2022-03-27: 200 mg via INTRAVENOUS
  Filled 2022-03-27: qty 200

## 2022-03-27 MED ORDER — SODIUM CHLORIDE 0.9 % IV SOLN
Freq: Once | INTRAVENOUS | Status: AC
  Filled 2022-03-27: qty 250

## 2022-03-27 NOTE — Patient Instructions (Signed)

## 2022-03-27 NOTE — Progress Notes (Signed)
Nutrition Follow-up:  Patient with multiple myeloma, currently on chemotherapy.    Met with patient during venofer infusion.  Patient reports that her appetite is fairly good.  Later tells me that sometimes her stomach gets upset after she eats.  Took tums prior to eating as was better.  Sounds like she has been having some vomiting as well.  Has been drinking glucerna shakes.  Has been eating cheerios for breakfast. Lunch usually a sandwich and dinner meat and vegetables.  Has been having some swelling in lower extremities.     Medications: reviewed  Labs: reviewed  Anthropometrics:   Weight 125 lb  135 lb on 12/1 (bilateral lower extremity swelling??) 124 lb on 10/27 134 lb at PCP office 127 lb on 9/28  NUTRITION DIAGNOSIS: Inadequate oral intake ongoing    INTERVENTION:  Continue glucerna shakes Encouraged patient to monitor stomach upset with meals and inform MD about symptoms Reviewed ways to add calories in diet    MONITORING, EVALUATION, GOAL: weight trends, intake   NEXT VISIT:  Friday, Jan 26 during infusion  Carneshia Raker B. [name], [place], [place] Registered Dietitian [phone]

## 2022-03-31 ENCOUNTER — Other Ambulatory Visit: Payer: Self-pay

## 2022-03-31 DIAGNOSIS — C9000 Multiple myeloma not having achieved remission: Secondary | ICD-10-CM

## 2022-03-31 MED ORDER — MONTELUKAST SODIUM 10 MG PO TABS: ORAL_TABLET | ORAL | 1 refills | Status: DC

## 2022-03-31 MED ORDER — DEXAMETHASONE 4 MG PO TABS: 20.0000 mg | ORAL_TABLET | ORAL | 3 refills | Status: DC

## 2022-03-31 NOTE — Progress Notes (Signed)
After discussion with the provider, the patient will begin taking their premedications at home prior to daratumumab infusions. Premedications to be taken at home include Dexamethasone, starting 04/11/2022. A calendar has been mailed to the patient's address.  Laray Anger, PharmD PGY-2 Pharmacy Resident Hematology/Oncology [phone]  03/31/2022 11:48 AM

## 2022-04-11 ENCOUNTER — Other Ambulatory Visit: Payer: Self-pay | Admitting: Internal Medicine

## 2022-04-11 ENCOUNTER — Encounter: Payer: Self-pay | Admitting: Internal Medicine

## 2022-04-11 ENCOUNTER — Inpatient Hospital Stay: Payer: Medicare Other

## 2022-04-11 ENCOUNTER — Inpatient Hospital Stay (HOSPITAL_BASED_OUTPATIENT_CLINIC_OR_DEPARTMENT_OTHER): Payer: Medicare Other | Admitting: Internal Medicine

## 2022-04-11 ENCOUNTER — Ambulatory Visit: Payer: Medicare Other

## 2022-04-11 VITALS — BP 138/50 | HR 74 | Temp 98.0°F | Resp 17 | Wt 130.8 lb

## 2022-04-11 DIAGNOSIS — Z5112 Encounter for antineoplastic immunotherapy: Secondary | ICD-10-CM | POA: Diagnosis not present

## 2022-04-11 DIAGNOSIS — C9000 Multiple myeloma not having achieved remission: Secondary | ICD-10-CM

## 2022-04-11 DIAGNOSIS — K219 Gastro-esophageal reflux disease without esophagitis: Secondary | ICD-10-CM | POA: Diagnosis not present

## 2022-04-11 DIAGNOSIS — I10 Essential (primary) hypertension: Secondary | ICD-10-CM | POA: Diagnosis not present

## 2022-04-11 DIAGNOSIS — D649 Anemia, unspecified: Secondary | ICD-10-CM | POA: Diagnosis not present

## 2022-04-11 LAB — CBC WITH DIFFERENTIAL/PLATELET
Abs Immature Granulocytes: 0.03 10*3/uL (ref 0.00–0.07)
Basophils Absolute: 0 10*3/uL (ref 0.0–0.1)
Basophils Relative: 1 %
Eosinophils Absolute: 0.2 10*3/uL (ref 0.0–0.5)
Eosinophils Relative: 3 %
HCT: 33 % — ABNORMAL LOW (ref 36.0–46.0)
Hemoglobin: 10.4 g/dL — ABNORMAL LOW (ref 12.0–15.0)
Immature Granulocytes: 1 %
Lymphocytes Relative: 25 %
Lymphs Abs: 1.4 10*3/uL (ref 0.7–4.0)
MCH: 31.8 pg (ref 26.0–34.0)
MCHC: 31.5 g/dL (ref 30.0–36.0)
MCV: 100.9 fL — ABNORMAL HIGH (ref 80.0–100.0)
Monocytes Absolute: 0.2 10*3/uL (ref 0.1–1.0)
Monocytes Relative: 4 %
Neutro Abs: 3.6 10*3/uL (ref 1.7–7.7)
Neutrophils Relative %: 66 %
Platelets: 234 10*3/uL (ref 150–400)
RBC: 3.27 MIL/uL — ABNORMAL LOW (ref 3.87–5.11)
RDW: 17.4 % — ABNORMAL HIGH (ref 11.5–15.5)
WBC: 5.5 10*3/uL (ref 4.0–10.5)
nRBC: 0 % (ref 0.0–0.2)

## 2022-04-11 LAB — COMPREHENSIVE METABOLIC PANEL
ALT: 18 U/L (ref 0–44)
AST: 25 U/L (ref 15–41)
Albumin: 4.5 g/dL (ref 3.5–5.0)
Alkaline Phosphatase: 85 U/L (ref 38–126)
Anion gap: 10 (ref 5–15)
BUN: 26 mg/dL — ABNORMAL HIGH (ref 8–23)
CO2: 23 mmol/L (ref 22–32)
Calcium: 10.4 mg/dL — ABNORMAL HIGH (ref 8.9–10.3)
Chloride: 102 mmol/L (ref 98–111)
Creatinine, Ser: 1.01 mg/dL — ABNORMAL HIGH (ref 0.44–1.00)
GFR, Estimated: 54 mL/min — ABNORMAL LOW (ref >60)
Glucose, Bld: 150 mg/dL — ABNORMAL HIGH (ref 70–99)
Potassium: 3.8 mmol/L (ref 3.5–5.1)
Sodium: 135 mmol/L (ref 135–145)
Total Bilirubin: 0.4 mg/dL (ref 0.3–1.2)
Total Protein: 7.6 g/dL (ref 6.5–8.1)

## 2022-04-11 MED ORDER — DEXAMETHASONE 4 MG PO TABS
4.0000 mg | ORAL_TABLET | Freq: Once | ORAL | Status: AC
  Administered 2022-04-11: 4 mg via ORAL
  Filled 2022-04-11: qty 1

## 2022-04-11 MED ORDER — SODIUM CHLORIDE 0.9 % IV SOLN
Freq: Once | INTRAVENOUS | Status: AC
  Filled 2022-04-11: qty 250

## 2022-04-11 MED ORDER — DIPHENHYDRAMINE HCL 25 MG PO CAPS
50.0000 mg | ORAL_CAPSULE | Freq: Once | ORAL | Status: AC
  Administered 2022-04-11: 50 mg via ORAL
  Filled 2022-04-11: qty 2

## 2022-04-11 MED ORDER — DARATUMUMAB-HYALURONIDASE-FIHJ 1800-30000 MG-UT/15ML ~~LOC~~ SOLN
1800.0000 mg | Freq: Once | SUBCUTANEOUS | Status: AC
  Administered 2022-04-11: 1800 mg via SUBCUTANEOUS
  Filled 2022-04-11: qty 15

## 2022-04-11 MED ORDER — SODIUM CHLORIDE 0.9 % IV SOLN
200.0000 mg | Freq: Once | INTRAVENOUS | Status: AC
  Administered 2022-04-11: 200 mg via INTRAVENOUS
  Filled 2022-04-11: qty 10

## 2022-04-11 MED ORDER — ACETAMINOPHEN 325 MG PO TABS
650.0000 mg | ORAL_TABLET | Freq: Once | ORAL | Status: AC
  Administered 2022-04-11: 650 mg via ORAL
  Filled 2022-04-11: qty 2

## 2022-04-11 NOTE — Progress Notes (Signed)
Doing well. Some dyspnea with exertion. Appetite is fair. Occ diarrhea. Fatigue at times.

## 2022-04-11 NOTE — Assessment & Plan Note (Addendum)
#  STAGE I- MULTIPLE MYELOMA -Active [bone lesions; hypercalcemia; anemia; No renal insuffiencey;Bone marrow-32% plasma cell-   STANDARD Cytogenetics].  Currently on Revlimid-Dex- Dara SQ. Partial response noted;  DEC 2023-M protein = 0.2;  K/L RATIO= 1. 5 = WNL;  stable.  # Proceed with Dara SQ today; Labs today reviewed;  acceptable for treatment today. On Revlimid at 10 mg dose 3 weeks on 1 week off ; continue dex 4 mg weekly [reflux like sypmtoms- see below].   Stable.   #Reflux-like symptoms-  Secondary to dexamethasone; on PPI-  Re-start low-dose 4 mg weekly/WED.  and wants to hold off GI evaluation [per daughter]- improved.   # Anemia- Hb 8.19 Feb 2022- Iron sat-11 Ferritin- 26; continue trial of gentle iron. Hb [JAn 2024]- 10.4- improved; Proceed with venofer today  # Diarrhea- improved on Revlimid 10 mg a day- 3 weeks-ON & 1 week OFF. stable  # Diabetes-on oral hypoglycemic agents; on long acting insulin; 14 units of lantus on T/W/Thursday- Defer to PCP. PBF- BG-125 stable  # HTN: continue losartan; continue coreg; BP- 150-160- refilled coreg; stable  # Hypercalcemia secondary to multiple myeloma- [FEB 2023-Zometa ; calcitonin]-calcium today is 9.6. Zometa q 10M; stable  # Weight loss-s/p re-veluation with Joli - stable  #DVT/shingles prophylaxis: Aspirin/acyclovir.stable  # Vaccinations; Ok with flu/COVID shot  # IV access:PIV   [name]  zometa-q 3 months [next March 2024]- ; MM; K/L q 4 W  # DISPOSITION:  # proceed with  dara SQ; venofer;   # follow up in 4 weeks/Friday  MD; labs- cbc/cmp MM panel;K/L light chains; iron studies, ferritin; Dara SQ; possible venofer- ;Dr.B

## 2022-04-11 NOTE — Progress Notes (Signed)
Venofer infiltrated when infusion was about 75% complete, cathter removed, dressing placed and ice was placed on site per pharmacy recs. Pt reports no pain at this time, RUE elevated and pt educated on post infiltration care.

## 2022-04-11 NOTE — Progress Notes (Signed)
Nutrition Follow-up:  Patient with multiple myeloma, currently on chemotherapy.   Met with patient during infusion.  Patient reports that she has been eating more.  Has had issues with diarrhea but takes imodium. Has been drinking glucerna shakes as well.  Able to do more things around her house.      Medications: reviewed  Labs: glucose 150, BUN 26, creatinine 1.01, calcium 10.4  Anthropometrics:   Weight 130 lb 12.8 oz today 125 lb on 1/11 135 lb on 12/1 (swelling) 124 lb on 10/27 127 lb on 9/28   NUTRITION DIAGNOSIS: Inadequate oral intake improving   INTERVENTION:  Continue glucerna shakes Continue imodium with diarrhea Continue frequent meals/snacks to maintain weight    MONITORING, EVALUATION, GOAL: weight trends, intake   NEXT VISIT: Firday, Feb 23 during infusion  Tonika Eden B. [name], [place], [place] Registered Dietitian [phone]

## 2022-04-11 NOTE — Progress Notes (Signed)
[facility] NOTE  Patient Care Team: Juline Patch, MD as PCP - General (Family Medicine) Vladimir Faster, [facility] (Inactive) (Pharmacist) Cammie Sickle, MD as Consulting Physician (Oncology)  CHIEF COMPLAINTS/PURPOSE OF CONSULTATION: Multiple myeloma  Oncology History Overview Note  # MULTIPLE MYELOMA  [FEB 2023-hypercalcemia status changes]-Active [bone lesions; hypercalcemia; anemia; No renal insuffiencey;Bone marrow-32% plasma cell-   STANDARD Cytogenetics].FEB 2023- S/p dexamethasone 20 mg q day x4.   # REV [15 mg -3 week-On and 1 week-OFF]-DARA; May 2023-discontinued rev 15 diarrhea/hypotension  # MAY 2023- dara-Rev 10 mg 3w/1w   # FEB 2023-hypercalcemia [[facility]-status post calcitonin; bisphosphonate]  BONE MARROW, ASPIRATE, CLOT, CORE:  -Hypercellular bone marrow with plasma cell neoplasm  -See comment   PERIPHERAL BLOOD:  -Normocytic-normochromic anemia   COMMENT:   The bone marrow is hypercellular for age with increased number of  atypical plasma cells representing 32% of all cells in the aspirate  associated with interstitial infiltrates and numerous variably sized  clusters in the clot/biopsy sections.  The plasma cells display weak  kappa light chain restriction consistent with plasma cell neoplasm.  Correlation with cytogenetic and FISH studies is recommended   MICROSCOPIC DESCRIPTION:   PERIPHERAL BLOOD SMEAR: The red blood cells display mild  anisopoikilocytosis with mild polychromasia.  The white blood cells are  normal number with scattered hypogranular neutrophils. An occasional  myelocyte and large atypical mononuclear cells are seen on scan. The  platelets are normal in number.    Multiple myeloma not having achieved remission ([place])  05/15/2021 Initial Diagnosis   Multiple myeloma not having achieved remission ([place])   06/27/2021 - 10/17/2021 Chemotherapy   Patient is on Treatment Plan : MYELOMA Daratumumab SQ + Lenalidomide +  Dexamethasone (DaraRd) q28d     06/27/2021 -  Chemotherapy   Patient is on Treatment Plan : MYELOMA RELAPSED REFRACTORY Daratumumab SQ + Lenalidomide + Dexamethasone (DaraRd) q28d     07/11/2021 Cancer Staging   Staging form: Plasma Cell Myeloma and Plasma Cell Disorders, AJCC 8th Edition - Clinical: Beta-2-microglobulin (mg/L): 2.6, Albumin (g/dL): 3.9, ISS: Stage I - Signed by Cammie Sickle, MD on 07/11/2021 Stage prefix: Initial diagnosis Beta 2 microglobulin range (mg/L): Less than 3.5 Albumin range (g/dL): Greater than or equal to 3.5     HISTORY OF PRESENTING ILLNESS: pt in a wheel chair; accompanied by her daughter.   Cristina Morgan 88 y.o.  female with  multiple myeloma currently on Rev-Dex is proceed with Dara SQ today.    Patient noted to have improvement of her reflux since she started taking her Protonix.  Chronic mild fatigue.  Otherwise no worsening shortness of breath or cough. Diarrhea well controlled with last loose stool last week improved with Lomotil.   Denies any nausea vomiting.  Denies any blood in stools or black-colored stools.   Patient states her back pain is improved.  Denies any worsening joint pains.  No falls.  No further episodes of mental status changes.    Review of Systems  Constitutional:  Positive for malaise/fatigue and weight loss. Negative for chills, diaphoresis and fever.  HENT:  Negative for nosebleeds and sore throat.   Eyes:  Negative for double vision.  Respiratory:  Negative for cough, hemoptysis, sputum production, shortness of breath and wheezing.   Cardiovascular:  Negative for chest pain, palpitations, orthopnea and leg swelling.  Gastrointestinal:  Negative for abdominal pain, blood in stool, constipation, diarrhea, heartburn, melena, nausea and vomiting.  Genitourinary:  Negative for  dysuria, frequency and urgency.  Musculoskeletal:  Positive for back pain and joint pain.  Skin: Negative.  Negative for itching and rash.   Neurological:  Negative for dizziness, tingling, focal weakness, weakness and headaches.  Endo/Heme/Allergies:  Does not bruise/bleed easily.  Psychiatric/Behavioral:  Negative for depression. The patient is not nervous/anxious and does not have insomnia.      MEDICAL HISTORY:  Past Medical History:  Diagnosis Date   Anemia    CAD (coronary artery disease)    Cataracts, bilateral    Closed compression fracture of first lumbar vertebra (HCC)    Closed compression fracture of second lumbar vertebra (HCC)    Diabetes mellitus without complication (HCC)    High cholesterol    History of pneumonia 2010   [place]   History of uterine fibroid    Hypercalcemia    Hyperlipidemia    Hypertension    Lytic bone lesions on xray    Multiple myeloma (HCC)    Osteoporosis    Primary osteoarthritis of right knee     SURGICAL HISTORY: Past Surgical History:  Procedure Laterality Date   CATARACT EXTRACTION Bilateral 2014   COLONOSCOPY  2011   cleared for 5 yrs- [facility]   ECTOPIC PREGNANCY SURGERY      SOCIAL HISTORY: Social History   Socioeconomic History   Marital status: Married    Spouse name: Not on file   Number of children: 2   Years of education: Not on file   Highest education level: 12th grade  Occupational History   Occupation: Retired  Tobacco Use   Smoking status: Never    Passive exposure: Never   Smokeless tobacco: Never   Tobacco comments:    smoking cessation materials not required  Vaping Use   Vaping Use: Never used  Substance and Sexual Activity   Alcohol use: No   Drug use: No   Sexual activity: Not Currently  Other Topics Concern   Not on file  Social History Narrative   [place] with husband; drives. Never smoked; no alcohol. Daughters live close.    Social Determinants of Health   Financial Resource Strain: Low Risk  (11/13/2021)   Overall Financial Resource Strain (CARDIA)    Difficulty of Paying Living Expenses: Not hard at all  Food  Insecurity: No Food Insecurity (11/13/2021)   Hunger Vital Sign    Worried About Running Out of Food in the Last Year: Never true    Ran Out of Food in the Last Year: Never true  Transportation Needs: No Transportation Needs (11/13/2021)   PRAPARE - Hydrologist (Medical): No    Lack of Transportation (Non-Medical): No  Physical Activity: Sufficiently Active (11/13/2021)   Exercise Vital Sign    Days of Exercise per Week: 7 days    Minutes of Exercise per Session: 30 min  Stress: No Stress Concern Present (11/13/2021)   [place]    Feeling of Stress : Not at all  Social Connections: Moderately Integrated (11/13/2021)   Social Connection and Isolation Panel [NHANES]    Frequency of Communication with Friends and Family: Twice a week    Frequency of Social Gatherings with Friends and Family: More than three times a week    Attends Religious Services: 1 to 4 times per year    Active Member of [employer] or Organizations: No    Attends Archivist Meetings: Never    Marital Status: Married  Intimate Partner Violence: Not At Risk (11/13/2021)   Humiliation, Afraid, Rape, and Kick questionnaire    Fear of Current or Ex-Partner: No    Emotionally Abused: No    Physically Abused: No    Sexually Abused: No    FAMILY HISTORY: Family History  Problem Relation Age of Onset   Cancer Mother        breast   Heart disease Mother    Diabetes Father    Heart disease Father    Heart attack Father    Heart attack Brother    Diabetes Brother     ALLERGIES:  is allergic to latex and penicillins.  MEDICATIONS:  Current Outpatient Medications  Medication Sig Dispense Refill   ACCU-CHEK GUIDE test strip USE 1 STRIP TO CHECK GLUCOSE ONCE DAILY AS DIRECTED 100 each 0   Accu-Chek Softclix Lancets lancets USE 1  TO CHECK GLUCOSE ONCE DAILY 100 each 0   acetaminophen (TYLENOL) 325 MG tablet Take 325 mg  by mouth 2 (two) times daily. START 2 days prior to chemo infusion. Take For 2 days; Do NOT take on the day of infusion     acyclovir (ZOVIRAX) 400 MG tablet Take 1 tablet (400 mg total) by mouth 2 (two) times daily. 60 tablet 4   BD PEN NEEDLE NANO 2ND GEN 32G X 4 MM MISC USE 1  ONCE DAILY 100 each 0   Blood Glucose Monitoring Suppl (ACCU-CHEK GUIDE) w/Device KIT USE AS DIRECTED TO CHECK GLUCOSE     Calcium Carbonate-Vit D-Min (CALCIUM 1200 PO) Take 1 capsule by mouth daily.     carvedilol (COREG) 3.125 MG tablet TAKE ONE TABLET BY MOUTH TWICE DAILY with meals 60 tablet 1   cholecalciferol (VITAMIN D3) 25 MCG (1000 UNIT) tablet Take 1,000 Units by mouth daily.     dexamethasone (DECADRON) 4 MG tablet Take 1 tablet (4 mg total) by mouth 2 (two) times daily. Start 2 days prior to infusion; Take it for 2 days. 60 tablet 3   dexamethasone (DECADRON) 4 MG tablet Take 5 tablets (20 mg total) by mouth as directed. Take one hour before monthly Darzalex injections. 15 tablet 3   diphenoxylate-atropine (LOMOTIL) 2.5-0.025 MG tablet Take 1 tablet by mouth 4 (four) times daily as needed for diarrhea or loose stools. Take it along with immodium 60 tablet 0   Glucerna (GLUCERNA) LIQD Take 237 mLs by mouth.     hydrOXYzine (ATARAX) 10 MG tablet Take 1 tablet (10 mg total) by mouth at bedtime as needed. 90 tablet 1   Infant Foods (GOOD START 2 ESSENTIALS/IRON PO) Take 1 tablet by mouth daily.     insulin glargine (LANTUS SOLOSTAR) 100 UNIT/ML Solostar Pen Inject 10 Units into the skin daily. 6 units every day except Wed and Thursday- 8 units 15 mL 0   lenalidomide (REVLIMID) 10 MG capsule Take 1 capsule (10 mg total) by mouth daily. Take for 21 days, then hold for 7 days. Repeat every 28 days. 21 capsule 0   losartan (COZAAR) 25 MG tablet TAKE ONE TABLET BY MOUTH EVERY EVENING 90 tablet 0   lovastatin (MEVACOR) 40 MG tablet Take 1 tablet (40 mg total) by mouth every evening. 90 tablet 1   metFORMIN (GLUCOPHAGE)  1000 MG tablet Take 1 tablet (1,000 mg total) by mouth 2 (two) times daily. 180 tablet 1   montelukast (SINGULAIR) 10 MG tablet Take ONE tablet by MOUTH daily. START TWO DAYS prior TO chemo infusion. Take FOR TWO DAYS; DO  NOT take ON THE DAY of infusion. 20 tablet 1   ondansetron (ZOFRAN) 8 MG tablet One pill every 8 hours as needed for nausea/vomitting. 40 tablet 1   pantoprazole (PROTONIX) 40 MG tablet Take 1 tablet by mouth once daily 90 tablet 0   prochlorperazine (COMPAZINE) 10 MG tablet Take 1 tablet (10 mg total) by mouth every 6 (six) hours as needed for nausea or vomiting. 40 tablet 1   sertraline (ZOLOFT) 25 MG tablet TAKE ONE TABLET BY MOUTH ONCE DAILY 90 tablet 1   triamcinolone (NASACORT) 55 MCG/ACT AERO nasal inhaler 2 sprays 2 (two) times daily.     No current facility-administered medications for this visit.      Marland Kitchen  PHYSICAL EXAMINATION:  Vitals:   04/11/22 0906  BP: (!) 138/50  Pulse: 74  Resp: 17  Temp: 98 F (36.7 C)  SpO2: 100%    Filed Weights   04/11/22 0906  Weight: 130 lb 12.8 oz (59.3 kg)     Physical Exam Vitals and nursing note reviewed.  HENT:     Head: Normocephalic and atraumatic.     Mouth/Throat:     Pharynx: Oropharynx is clear.  Eyes:     Extraocular Movements: Extraocular movements intact.     Pupils: Pupils are equal, round, and reactive to light.  Cardiovascular:     Rate and Rhythm: Normal rate and regular rhythm.  Pulmonary:     Comments: Decreased breath sounds bilaterally.  Abdominal:     Palpations: Abdomen is soft.  Musculoskeletal:        General: Normal range of motion.     Cervical back: Normal range of motion.  Skin:    General: Skin is warm.  Neurological:     General: No focal deficit present.     Mental Status: She is alert and oriented to person, place, and time.  Psychiatric:        Behavior: Behavior normal.        Judgment: Judgment normal.      LABORATORY DATA:  I have reviewed the data as  listed Lab Results  Component Value Date   WBC 5.5 04/11/2022   HGB 10.4 (L) 04/11/2022   HCT 33.0 (L) 04/11/2022   MCV 100.9 (H) 04/11/2022   PLT 234 04/11/2022   Recent Labs    04/15/21 1549 05/01/21 0320 02/14/22 0906 03/14/22 1102 04/11/22 0836  NA  --    < > 137 139 135  K  --    < > 3.5 3.6 3.8  CL  --    < > 105 107 102  CO2  --    < > '22 25 23  '$ GLUCOSE  --    < > 125* 105* 150*  BUN  --    < > 25* 20 26*  CREATININE  --    < > 1.06* 1.18* 1.01*  CALCIUM  --    < > 9.5 9.4 10.4*  GFRNONAA  --    < > 51* 45* 54*  PROT  --    < > 6.2* 6.3* 7.6  ALBUMIN 4.4   < > 3.8 3.4* 4.5  AST 17   < > '25 26 25  '$ ALT 8   < > '13 22 18  '$ ALKPHOS 108   < > 59 55 85  BILITOT 0.3   < > 0.6 0.4 0.4  BILIDIR 0.10  --   --   --   --    < > =  values in this interval not displayed.    RADIOGRAPHIC STUDIES: I have personally reviewed the radiological images as listed and agreed with the findings in the report. No results found.  Multiple myeloma not having achieved remission ([place]) # STAGE I- MULTIPLE MYELOMA -Active [bone lesions; hypercalcemia; anemia; No renal insuffiencey;Bone marrow-32% plasma cell-   STANDARD Cytogenetics].  Currently on Revlimid-Dex- Dara SQ. Partial response noted;  DEC 2023-M protein = 0.2;  K/L RATIO= 1. 5 = WNL;  stable.  # Proceed with Dara SQ today; Labs today reviewed;  acceptable for treatment today. On Revlimid at 10 mg dose 3 weeks on 1 week off ; continue dex 4 mg weekly [reflux like sypmtoms- see below].   Stable.   #Reflux-like symptoms-  Secondary to dexamethasone; on PPI-  Re-start low-dose 4 mg weekly/WED.  and wants to hold off GI evaluation [per daughter]- improved.   # Anemia- Hb 8.19 Feb 2022- Iron sat-11 Ferritin- 26; continue trial of gentle iron. Hb [JAn 2024]- 10.4- improved; Proceed with venofer today  # Diarrhea- improved on Revlimid 10 mg a day- 3 weeks-ON & 1 week OFF. stable  # Diabetes-on oral hypoglycemic agents; on long acting  insulin; 14 units of lantus on T/W/Thursday- Defer to PCP. PBF- BG-125 stable  # HTN: continue losartan; continue coreg; BP- 150-160- refilled coreg; stable  # Hypercalcemia secondary to multiple myeloma- [FEB 2023-Zometa ; calcitonin]-calcium today is 9.6. Zometa q 20M; stable  # Weight loss-s/p re-veluation with Joli - stable  #DVT/shingles prophylaxis: Aspirin/acyclovir.stable  # Vaccinations; Ok with flu/COVID shot  # IV access:PIV   [name]  zometa-q 3 months [next March 2024]- ; MM; K/L q 4 W  # DISPOSITION:  # proceed with  dara SQ; venofer;   # follow up in 4 weeks/Friday  MD; labs- cbc/cmp MM panel;K/L light chains; iron studies, ferritin; Dara SQ; possible venofer- ;Dr.B     All questions were answered. The patient knows to call the clinic with any problems, questions or concerns.       Cammie Sickle, MD 04/11/2022 9:43 AM

## 2022-04-11 NOTE — Patient Instructions (Signed)
[facility]  Discharge Instructions: Thank you for choosing [facility] to provide your oncology and hematology care.  If you have a lab appointment with the [facility], please go directly to the [facility] and check in at the registration area.  Wear comfortable clothing and clothing appropriate for easy access to any Portacath or PICC line.   We strive to give you quality time with your provider. You may need to reschedule your appointment if you arrive late (15 or more minutes).  Arriving late affects you and other patients whose appointments are after yours.  Also, if you miss three or more appointments without notifying the office, you may be dismissed from the clinic at the provider's discretion.      For prescription refill requests, have your pharmacy contact our office and allow 72 hours for refills to be completed.    Today you received the following chemotherapy and/or immunotherapy agents Darzalex and Venofer.      To help prevent nausea and vomiting after your treatment, we encourage you to take your nausea medication as directed.  BELOW ARE SYMPTOMS THAT SHOULD BE REPORTED IMMEDIATELY: *FEVER GREATER THAN 100.4 F (38 C) OR HIGHER *CHILLS OR SWEATING *NAUSEA AND VOMITING THAT IS NOT CONTROLLED WITH YOUR NAUSEA MEDICATION *UNUSUAL SHORTNESS OF BREATH *UNUSUAL BRUISING OR BLEEDING *URINARY PROBLEMS (pain or burning when urinating, or frequent urination) *BOWEL PROBLEMS (unusual diarrhea, constipation, pain near the anus) TENDERNESS IN MOUTH AND THROAT WITH OR WITHOUT PRESENCE OF ULCERS (sore throat, sores in mouth, or a toothache) UNUSUAL RASH, SWELLING OR PAIN  UNUSUAL VAGINAL DISCHARGE OR ITCHING   Items with * indicate a potential emergency and should be followed up as soon as possible or go to the Emergency Department if any problems should occur.  Please show the CHEMOTHERAPY ALERT CARD or IMMUNOTHERAPY ALERT CARD at  check-in to the Emergency Department and triage nurse.  Should you have questions after your visit or need to cancel or reschedule your appointment, please contact [facility]  [phone] and follow the prompts.  Office hours are 8:00 a.m. to 4:30 p.m. Monday - Friday. Please note that voicemails left after 4:00 p.m. may not be returned until the following business day.  We are closed weekends and major holidays. You have access to a nurse at all times for urgent questions. Please call the main number to the clinic [phone] and follow the prompts.  For any non-urgent questions, you may also contact your provider using MyChart. We now offer e-Visits for anyone 83 and older to request care online for non-urgent symptoms. For details visit [email or web address].   Also download the MyChart app! Go to the app store, search "MyChart", open the app, select [facility], and log in with your MyChart username and password.

## 2022-04-14 ENCOUNTER — Other Ambulatory Visit: Payer: Self-pay | Admitting: Family Medicine

## 2022-04-14 LAB — KAPPA/LAMBDA LIGHT CHAINS
Kappa free light chain: 17 mg/L (ref 3.3–19.4)
Kappa, lambda light chain ratio: 1.55 (ref 0.26–1.65)
Lambda free light chains: 11 mg/L (ref 5.7–26.3)

## 2022-04-16 LAB — MULTIPLE MYELOMA PANEL, SERUM
Albumin SerPl Elph-Mcnc: 4.3 g/dL (ref 2.9–4.4)
Albumin/Glob SerPl: 2 — ABNORMAL HIGH (ref 0.7–1.7)
Alpha 1: 0.2 g/dL (ref 0.0–0.4)
Alpha2 Glob SerPl Elph-Mcnc: 0.7 g/dL (ref 0.4–1.0)
B-Globulin SerPl Elph-Mcnc: 0.8 g/dL (ref 0.7–1.3)
Gamma Glob SerPl Elph-Mcnc: 0.5 g/dL (ref 0.4–1.8)
Globulin, Total: 2.2 g/dL (ref 2.2–3.9)
IgA: 69 mg/dL (ref 64–422)
IgG (Immunoglobin G), Serum: 646 mg/dL (ref 586–1602)
IgM (Immunoglobulin M), Srm: 25 mg/dL — ABNORMAL LOW (ref 26–217)
Total Protein ELP: 6.5 g/dL (ref 6.0–8.5)

## 2022-04-21 ENCOUNTER — Encounter: Payer: Self-pay | Admitting: Family Medicine

## 2022-04-21 ENCOUNTER — Ambulatory Visit: Payer: Medicare Other | Admitting: Family Medicine

## 2022-04-21 ENCOUNTER — Ambulatory Visit (INDEPENDENT_AMBULATORY_CARE_PROVIDER_SITE_OTHER): Payer: Medicare Other | Admitting: Family Medicine

## 2022-04-21 ENCOUNTER — Other Ambulatory Visit: Payer: Self-pay | Admitting: Internal Medicine

## 2022-04-21 VITALS — BP 120/68 | HR 72 | Ht 66.0 in | Wt 130.0 lb

## 2022-04-21 DIAGNOSIS — H6123 Impacted cerumen, bilateral: Secondary | ICD-10-CM | POA: Diagnosis not present

## 2022-04-21 DIAGNOSIS — J019 Acute sinusitis, unspecified: Secondary | ICD-10-CM

## 2022-04-21 DIAGNOSIS — C9000 Multiple myeloma not having achieved remission: Secondary | ICD-10-CM

## 2022-04-21 MED ORDER — AZITHROMYCIN 250 MG PO TABS: ORAL_TABLET | ORAL | 0 refills | Status: AC

## 2022-04-21 NOTE — Progress Notes (Signed)
Date:  04/21/2022   Name:  Cristina Morgan   DOB:  1935-01-04   MRN:  [id number]   Chief Complaint: Cough (Yellow/ green production, no facial pressure)  Cough This is a new problem. The current episode started 1 to 4 weeks ago (2 weeks). The problem occurs every few minutes. The cough is Productive of purulent sputum (yellow). Associated symptoms include chills. Pertinent negatives include no chest pain, fever, nasal congestion, postnasal drip, rhinorrhea, sore throat, shortness of breath or wheezing. She has tried OTC cough suppressant for the symptoms. The treatment provided mild relief. There is no history of emphysema.    Lab Results  Component Value Date   NA 135 04/11/2022   K 3.8 04/11/2022   CO2 23 04/11/2022   GLUCOSE 150 (H) 04/11/2022   BUN 26 (H) 04/11/2022   CREATININE 1.01 (H) 04/11/2022   CALCIUM 10.4 (H) 04/11/2022   EGFR 68 02/01/2021   GFRNONAA 54 (L) 04/11/2022   Lab Results  Component Value Date   CHOL 176 07/24/2020   HDL 74 07/24/2020   LDLCALC 90 07/24/2020   TRIG 63 07/24/2020   CHOLHDL 2.7 03/01/2018   Lab Results  Component Value Date   TSH 0.367 05/03/2021   Lab Results  Component Value Date   HGBA1C 7.3 (H) 03/06/2022   Lab Results  Component Value Date   WBC 5.5 04/11/2022   HGB 10.4 (L) 04/11/2022   HCT 33.0 (L) 04/11/2022   MCV 100.9 (H) 04/11/2022   PLT 234 04/11/2022   Lab Results  Component Value Date   ALT 18 04/11/2022   AST 25 04/11/2022   ALKPHOS 85 04/11/2022   BILITOT 0.4 04/11/2022   No results found for: "25OHVITD2", "25OHVITD3", "VD25OH"   Review of Systems  Constitutional:  Positive for chills. Negative for fever.  HENT:  Negative for postnasal drip, rhinorrhea and sore throat.   Respiratory:  Positive for cough. Negative for shortness of breath and wheezing.   Cardiovascular:  Negative for chest pain.    Patient Active Problem List   Diagnosis Date Noted   Multiple myeloma not having achieved remission  ([place]) 05/15/2021   Pressure injury of coccygeal region, stage 2 ([place]) 05/07/2021   Hyponatremia 05/04/2021   Pressure injury of skin 05/02/2021   Vitamin B12 deficiency 05/02/2021   Hypomagnesemia 05/02/2021   Hypophosphatemia 05/02/2021   SVT (supraventricular tachycardia) 05/02/2021   Altered mental status 84/13/2440   Acute metabolic encephalopathy 01/11/2535   Hypercalcemia 05/01/2021   Closed compression fracture of second lumbar vertebra ([place]) 10/28/2020   Need for vaccination against Streptococcus pneumoniae using pneumococcal conjugate vaccine 13 01/07/2017   Primary osteoarthritis of right knee 12/08/2016   PNA (pneumonia) 07/09/2015   Acute chest pain 03/20/2014   Type 2 diabetes mellitus ([place]) 03/20/2014   Hypercholesterolemia 03/20/2014   Essential hypertension 03/20/2014    Allergies  Allergen Reactions   Latex Rash   Penicillins Other (See Comments)    Past Surgical History:  Procedure Laterality Date   CATARACT EXTRACTION Bilateral 2014   COLONOSCOPY  2011   cleared for 5 yrs- [facility]   ECTOPIC PREGNANCY SURGERY      Social History   Tobacco Use   Smoking status: Never    Passive exposure: Never   Smokeless tobacco: Never   Tobacco comments:    smoking cessation materials not required  Vaping Use   Vaping Use: Never used  Substance Use Topics   Alcohol use: No   Drug use:  No     Medication list has been reviewed and updated.  Current Meds  Medication Sig   ACCU-CHEK GUIDE test strip USE 1 STRIP TO CHECK GLUCOSE ONCE DAILY AS DIRECTED   Accu-Chek Softclix Lancets lancets USE 1  TO CHECK GLUCOSE ONCE DAILY   acetaminophen (TYLENOL) 325 MG tablet Take 325 mg by mouth 2 (two) times daily. START 2 days prior to chemo infusion. Take For 2 days; Do NOT take on the day of infusion   acyclovir (ZOVIRAX) 400 MG tablet Take 1 tablet (400 mg total) by mouth 2 (two) times daily.   BD PEN NEEDLE NANO 2ND GEN 32G X 4 MM MISC USE 1  ONCE DAILY   Blood Glucose  Monitoring Suppl (ACCU-CHEK GUIDE) w/Device KIT USE AS DIRECTED TO CHECK GLUCOSE   Calcium Carbonate-Vit D-Min (CALCIUM 1200 PO) Take 1 capsule by mouth daily.   carvedilol (COREG) 3.125 MG tablet TAKE ONE TABLET BY MOUTH TWICE DAILY with meals   cholecalciferol (VITAMIN D3) 25 MCG (1000 UNIT) tablet Take 1,000 Units by mouth daily.   dexamethasone (DECADRON) 4 MG tablet Take 1 tablet (4 mg total) by mouth 2 (two) times daily. Start 2 days prior to infusion; Take it for 2 days.   dexamethasone (DECADRON) 4 MG tablet Take 5 tablets (20 mg total) by mouth as directed. Take one hour before monthly Darzalex injections.   diphenoxylate-atropine (LOMOTIL) 2.5-0.025 MG tablet Take 1 tablet by mouth 4 (four) times daily as needed for diarrhea or loose stools. Take it along with immodium   Glucerna (GLUCERNA) LIQD Take 237 mLs by mouth.   hydrOXYzine (ATARAX) 10 MG tablet Take 1 tablet (10 mg total) by mouth at bedtime as needed.   Infant Foods (GOOD START 2 ESSENTIALS/IRON PO) Take 1 tablet by mouth daily.   insulin glargine (LANTUS SOLOSTAR) 100 UNIT/ML Solostar Pen Inject 10 Units into the skin daily. 6 units every day except Wed and Thursday- 8 units   lenalidomide (REVLIMID) 10 MG capsule Take 1 capsule (10 mg total) by mouth daily. Take for 21 days, then hold for 7 days. Repeat every 28 days.   losartan (COZAAR) 25 MG tablet TAKE ONE TABLET BY MOUTH EVERY EVENING   lovastatin (MEVACOR) 40 MG tablet Take 1 tablet (40 mg total) by mouth every evening.   metFORMIN (GLUCOPHAGE) 1000 MG tablet Take 1 tablet (1,000 mg total) by mouth 2 (two) times daily.   montelukast (SINGULAIR) 10 MG tablet TAKE ONE TABLET ONCE DAILY. start TWO DAYS prior TO infusion. take FOR TWO DAYS AND DO not take ON DAY of infusion   ondansetron (ZOFRAN) 8 MG tablet One pill every 8 hours as needed for nausea/vomitting.   pantoprazole (PROTONIX) 40 MG tablet Take 1 tablet by mouth once daily   prochlorperazine (COMPAZINE) 10 MG  tablet Take 1 tablet (10 mg total) by mouth every 6 (six) hours as needed for nausea or vomiting.   sertraline (ZOLOFT) 25 MG tablet TAKE ONE TABLET BY MOUTH ONCE DAILY   triamcinolone (NASACORT) 55 MCG/ACT AERO nasal inhaler 2 sprays 2 (two) times daily.       04/21/2022    3:59 PM 12/30/2021    2:39 PM 08/30/2021    2:10 PM 07/01/2021    3:04 PM  GAD 7 : Generalized Anxiety Score  Nervous, Anxious, on Edge 0 0 0 0  Control/stop worrying 0 0 0 0  Worry too much - different things 0 0 0 0  Trouble relaxing 0 0 1  0  Restless 0 0 0 0  Easily annoyed or irritable 0 0 0 0  Afraid - awful might happen 0 0 0 0  Total GAD 7 Score 0 0 1 0  Anxiety Difficulty Not difficult at all Not difficult at all Not difficult at all Not difficult at all       04/21/2022    3:59 PM 12/30/2021    2:38 PM 11/13/2021   11:32 AM  Depression screen PHQ 2/9  Decreased Interest 0 0 0  Down, Depressed, Hopeless 0 0 0  PHQ - 2 Score 0 0 0  Altered sleeping 0 0   Tired, decreased energy 0 0   Change in appetite 0 0   Feeling bad or failure about yourself  0 0   Trouble concentrating 0 0   Moving slowly or fidgety/restless 0 0   Suicidal thoughts 0 0   PHQ-9 Score 0 0   Difficult doing work/chores Not difficult at all Not difficult at all     BP Readings from Last 3 Encounters:  04/21/22 120/68  04/11/22 (!) 138/50  03/27/22 (!) 133/55    Physical Exam Vitals and nursing note reviewed. Exam conducted with a chaperone present.  Constitutional:      General: She is not in acute distress.    Appearance: She is not diaphoretic.  HENT:     Head: Normocephalic and atraumatic.     Right Ear: Tympanic membrane and external ear normal.     Left Ear: Tympanic membrane and external ear normal.     Nose: Nose normal.     Mouth/Throat:     Mouth: Mucous membranes are moist.  Eyes:     General:        Right eye: No discharge.        Left eye: No discharge.     Conjunctiva/sclera: Conjunctivae normal.      Pupils: Pupils are equal, round, and reactive to light.  Neck:     Thyroid: No thyromegaly.     Vascular: No JVD.  Cardiovascular:     Rate and Rhythm: Normal rate and regular rhythm.     Heart sounds: Normal heart sounds. No murmur heard.    No friction rub. No gallop.  Pulmonary:     Effort: Pulmonary effort is normal.     Breath sounds: Normal breath sounds. No wheezing or rhonchi.  Abdominal:     General: Bowel sounds are normal.     Palpations: Abdomen is soft. There is no mass.     Tenderness: There is no abdominal tenderness. There is no guarding.  Musculoskeletal:        General: Normal range of motion.     Cervical back: Normal range of motion and neck supple.  Lymphadenopathy:     Cervical: No cervical adenopathy.  Skin:    General: Skin is warm and dry.  Neurological:     Mental Status: She is alert.     Deep Tendon Reflexes: Reflexes are normal and symmetric.     Wt Readings from Last 3 Encounters:  04/21/22 130 lb (59 kg)  04/11/22 130 lb 12.8 oz (59.3 kg)  03/14/22 125 lb 1.6 oz (56.7 kg)    BP 120/68   Pulse 72   Ht '5\' 6"'$  (1.676 m)   Wt 130 lb (59 kg)   SpO2 95%   BMI 20.98 kg/m   Assessment and Plan:  1. Acute sinusitis, recurrence not specified, unspecified location New onset.  Episodic.  Stable.  Patient probably is having a productive cough secondary to a postnasal drainage from suspected sinus involvement.  We will treat with azithromycin to 50 mg 2 tablets today followed by 1 a day for 4 days and encourage over-the-counter cough preparations such as Mucinex DM. - azithromycin (ZITHROMAX) 250 MG tablet; Take 2 tablets on day 1, then 1 tablet daily on days 2 through 5  Dispense: 6 tablet; Refill: 0  2. Bilateral impacted cerumen Chronic.  Persistent patient has cerumen impaction bilateral and will refer to ear nose and throat for cleaning of cerumen impaction. - Ambulatory referral to ENT    Otilio Miu, MD

## 2022-04-23 ENCOUNTER — Other Ambulatory Visit: Payer: Self-pay

## 2022-04-23 DIAGNOSIS — C9000 Multiple myeloma not having achieved remission: Secondary | ICD-10-CM

## 2022-04-23 MED ORDER — LENALIDOMIDE 10 MG PO CAPS: 10.0000 mg | ORAL_CAPSULE | Freq: Every day | ORAL | 0 refills | Status: DC

## 2022-04-30 ENCOUNTER — Encounter: Payer: Self-pay | Admitting: Family Medicine

## 2022-04-30 NOTE — Telephone Encounter (Signed)
Please review.  KP

## 2022-05-02 ENCOUNTER — Encounter: Payer: Self-pay | Admitting: Family Medicine

## 2022-05-02 ENCOUNTER — Ambulatory Visit (INDEPENDENT_AMBULATORY_CARE_PROVIDER_SITE_OTHER): Payer: Medicare Other | Admitting: Family Medicine

## 2022-05-02 VITALS — BP 120/78 | HR 58 | Ht 66.0 in | Wt 134.0 lb

## 2022-05-02 DIAGNOSIS — Z794 Long term (current) use of insulin: Secondary | ICD-10-CM

## 2022-05-02 DIAGNOSIS — E7800 Pure hypercholesterolemia, unspecified: Secondary | ICD-10-CM | POA: Diagnosis not present

## 2022-05-02 DIAGNOSIS — K219 Gastro-esophageal reflux disease without esophagitis: Secondary | ICD-10-CM

## 2022-05-02 DIAGNOSIS — E119 Type 2 diabetes mellitus without complications: Secondary | ICD-10-CM

## 2022-05-02 MED ORDER — PANTOPRAZOLE SODIUM 40 MG PO TBEC: 40.0000 mg | DELAYED_RELEASE_TABLET | Freq: Every day | ORAL | 0 refills | Status: DC

## 2022-05-02 MED ORDER — LOVASTATIN 40 MG PO TABS: 40.0000 mg | ORAL_TABLET | Freq: Every evening | ORAL | 1 refills | Status: DC

## 2022-05-02 MED ORDER — METFORMIN HCL 1000 MG PO TABS: 1000.0000 mg | ORAL_TABLET | Freq: Two times a day (BID) | ORAL | 1 refills | Status: DC

## 2022-05-02 MED ORDER — LANTUS SOLOSTAR 100 UNIT/ML ~~LOC~~ SOPN: 10.0000 [IU] | PEN_INJECTOR | Freq: Every day | SUBCUTANEOUS | 0 refills | Status: DC

## 2022-05-02 NOTE — Progress Notes (Signed)
Date:  05/02/2022   Name:  Cristina Morgan   DOB:  September 30, 1934   MRN:  [id number]   Chief Complaint: Diabetes (Will get labs drawn at Dr [facility] [employer] office)  Diabetes Pertinent negatives for hypoglycemia include no dizziness, headaches or nervousness/anxiousness. Pertinent negatives for diabetes include no fatigue and no weakness.    Lab Results  Component Value Date   NA 135 04/11/2022   K 3.8 04/11/2022   CO2 23 04/11/2022   GLUCOSE 150 (H) 04/11/2022   BUN 26 (H) 04/11/2022   CREATININE 1.01 (H) 04/11/2022   CALCIUM 10.4 (H) 04/11/2022   EGFR 68 02/01/2021   GFRNONAA 54 (L) 04/11/2022   Lab Results  Component Value Date   CHOL 176 07/24/2020   HDL 74 07/24/2020   LDLCALC 90 07/24/2020   TRIG 63 07/24/2020   CHOLHDL 2.7 03/01/2018   Lab Results  Component Value Date   TSH 0.367 05/03/2021   Lab Results  Component Value Date   HGBA1C 7.3 (H) 03/06/2022   Lab Results  Component Value Date   WBC 5.5 04/11/2022   HGB 10.4 (L) 04/11/2022   HCT 33.0 (L) 04/11/2022   MCV 100.9 (H) 04/11/2022   PLT 234 04/11/2022   Lab Results  Component Value Date   ALT 18 04/11/2022   AST 25 04/11/2022   ALKPHOS 85 04/11/2022   BILITOT 0.4 04/11/2022   No results found for: "25OHVITD2", "25OHVITD3", "VD25OH"   Review of Systems  Constitutional: Negative.  Negative for chills, fatigue, fever and unexpected weight change.  HENT:  Negative for congestion, ear discharge, ear pain, rhinorrhea, sinus pressure, sneezing and sore throat.   Respiratory:  Negative for cough, shortness of breath, wheezing and stridor.   Gastrointestinal:  Negative for abdominal pain, blood in stool, constipation, diarrhea and nausea.  Genitourinary:  Negative for dysuria, flank pain, frequency, hematuria, urgency and vaginal discharge.  Musculoskeletal:  Negative for arthralgias, back pain and myalgias.  Skin:  Negative for rash.  Neurological:  Negative for dizziness, weakness and headaches.   Hematological:  Negative for adenopathy. Does not bruise/bleed easily.  Psychiatric/Behavioral:  Negative for dysphoric mood. The patient is not nervous/anxious.     Patient Active Problem List   Diagnosis Date Noted   Multiple myeloma not having achieved remission ([place]) 05/15/2021   Pressure injury of coccygeal region, stage 2 ([place]) 05/07/2021   Hyponatremia 05/04/2021   Pressure injury of skin 05/02/2021   Vitamin B12 deficiency 05/02/2021   Hypomagnesemia 05/02/2021   Hypophosphatemia 05/02/2021   SVT (supraventricular tachycardia) 05/02/2021   Altered mental status 99991111   Acute metabolic encephalopathy 99991111   Hypercalcemia 05/01/2021   Closed compression fracture of second lumbar vertebra ([place]) 10/28/2020   Need for vaccination against Streptococcus pneumoniae using pneumococcal conjugate vaccine 13 01/07/2017   Primary osteoarthritis of right knee 12/08/2016   PNA (pneumonia) 07/09/2015   Acute chest pain 03/20/2014   Type 2 diabetes mellitus ([place]) 03/20/2014   Hypercholesterolemia 03/20/2014   Essential hypertension 03/20/2014    Allergies  Allergen Reactions   Latex Rash   Penicillins Other (See Comments)    Past Surgical History:  Procedure Laterality Date   CATARACT EXTRACTION Bilateral 2014   COLONOSCOPY  2011   cleared for 5 yrs- [facility]   ECTOPIC PREGNANCY SURGERY      Social History   Tobacco Use   Smoking status: Never    Passive exposure: Never   Smokeless tobacco: Never   Tobacco comments:  smoking cessation materials not required  Vaping Use   Vaping Use: Never used  Substance Use Topics   Alcohol use: No   Drug use: No     Medication list has been reviewed and updated.  Current Meds  Medication Sig   ACCU-CHEK GUIDE test strip USE 1 STRIP TO CHECK GLUCOSE ONCE DAILY AS DIRECTED   Accu-Chek Softclix Lancets lancets USE 1  TO CHECK GLUCOSE ONCE DAILY   acetaminophen (TYLENOL) 325 MG tablet Take 325 mg by mouth 2 (two) times  daily. START 2 days prior to chemo infusion. Take For 2 days; Do NOT take on the day of infusion   acyclovir (ZOVIRAX) 400 MG tablet Take 1 tablet (400 mg total) by mouth 2 (two) times daily.   BD PEN NEEDLE NANO 2ND GEN 32G X 4 MM MISC USE 1  ONCE DAILY   Blood Glucose Monitoring Suppl (ACCU-CHEK GUIDE) w/Device KIT USE AS DIRECTED TO CHECK GLUCOSE   Calcium Carbonate-Vit D-Min (CALCIUM 1200 PO) Take 1 capsule by mouth daily.   carvedilol (COREG) 3.125 MG tablet TAKE ONE TABLET BY MOUTH TWICE DAILY with meals   cholecalciferol (VITAMIN D3) 25 MCG (1000 UNIT) tablet Take 1,000 Units by mouth daily.   dexamethasone (DECADRON) 4 MG tablet Take 1 tablet (4 mg total) by mouth 2 (two) times daily. Start 2 days prior to infusion; Take it for 2 days.   dexamethasone (DECADRON) 4 MG tablet Take 5 tablets (20 mg total) by mouth as directed. Take one hour before monthly Darzalex injections.   diphenoxylate-atropine (LOMOTIL) 2.5-0.025 MG tablet Take 1 tablet by mouth 4 (four) times daily as needed for diarrhea or loose stools. Take it along with immodium   Glucerna (GLUCERNA) LIQD Take 237 mLs by mouth.   hydrOXYzine (ATARAX) 10 MG tablet Take 1 tablet (10 mg total) by mouth at bedtime as needed.   Infant Foods (GOOD START 2 ESSENTIALS/IRON PO) Take 1 tablet by mouth daily.   lenalidomide (REVLIMID) 10 MG capsule Take 1 capsule (10 mg total) by mouth daily. Take for 21 days, then hold for 7 days. Repeat every 28 days.   losartan (COZAAR) 25 MG tablet TAKE ONE TABLET BY MOUTH EVERY EVENING   montelukast (SINGULAIR) 10 MG tablet TAKE ONE TABLET ONCE DAILY. start TWO DAYS prior TO infusion. take FOR TWO DAYS AND DO not take ON DAY of infusion   ondansetron (ZOFRAN) 8 MG tablet One pill every 8 hours as needed for nausea/vomitting.   prochlorperazine (COMPAZINE) 10 MG tablet Take 1 tablet (10 mg total) by mouth every 6 (six) hours as needed for nausea or vomiting.   sertraline (ZOLOFT) 25 MG tablet TAKE ONE  TABLET BY MOUTH ONCE DAILY   triamcinolone (NASACORT) 55 MCG/ACT AERO nasal inhaler 2 sprays 2 (two) times daily.   [DISCONTINUED] insulin glargine (LANTUS SOLOSTAR) 100 UNIT/ML Solostar Pen Inject 10 Units into the skin daily. 6 units every day except Wed and Thursday- 8 units   [DISCONTINUED] lovastatin (MEVACOR) 40 MG tablet Take 1 tablet (40 mg total) by mouth every evening.   [DISCONTINUED] metFORMIN (GLUCOPHAGE) 1000 MG tablet Take 1 tablet (1,000 mg total) by mouth 2 (two) times daily.   [DISCONTINUED] pantoprazole (PROTONIX) 40 MG tablet Take 1 tablet by mouth once daily       05/02/2022    2:10 PM 04/21/2022    3:59 PM 12/30/2021    2:39 PM 08/30/2021    2:10 PM  GAD 7 : Generalized Anxiety Score  Nervous,  Anxious, on Edge 0 0 0 0  Control/stop worrying 0 0 0 0  Worry too much - different things 0 0 0 0  Trouble relaxing 0 0 0 1  Restless 0 0 0 0  Easily annoyed or irritable 0 0 0 0  Afraid - awful might happen 0 0 0 0  Total GAD 7 Score 0 0 0 1  Anxiety Difficulty Not difficult at all Not difficult at all Not difficult at all Not difficult at all       05/02/2022    2:09 PM 04/21/2022    3:59 PM 12/30/2021    2:38 PM  Depression screen PHQ 2/9  Decreased Interest 0 0 0  Down, Depressed, Hopeless 0 0 0  PHQ - 2 Score 0 0 0  Altered sleeping 0 0 0  Tired, decreased energy 0 0 0  Change in appetite 0 0 0  Feeling bad or failure about yourself  0 0 0  Trouble concentrating 0 0 0  Moving slowly or fidgety/restless 0 0 0  Suicidal thoughts 0 0 0  PHQ-9 Score 0 0 0  Difficult doing work/chores Not difficult at all Not difficult at all Not difficult at all    BP Readings from Last 3 Encounters:  05/02/22 120/78  04/21/22 120/68  04/11/22 (!) 138/50    Physical Exam Vitals and nursing note reviewed. Exam conducted with a chaperone present.  Constitutional:      General: She is not in acute distress.    Appearance: She is not diaphoretic.  HENT:     Head:  Normocephalic and atraumatic.     Right Ear: External ear normal.     Left Ear: External ear normal.     Nose: Nose normal.  Eyes:     General:        Right eye: No discharge.        Left eye: No discharge.     Conjunctiva/sclera: Conjunctivae normal.     Pupils: Pupils are equal, round, and reactive to light.  Neck:     Thyroid: No thyromegaly.     Vascular: No JVD.  Cardiovascular:     Rate and Rhythm: Normal rate and regular rhythm.     Heart sounds: Normal heart sounds. No murmur heard.    No friction rub. No gallop.  Pulmonary:     Effort: Pulmonary effort is normal.     Breath sounds: Normal breath sounds.  Abdominal:     General: Bowel sounds are normal.     Palpations: Abdomen is soft. There is no mass.     Tenderness: There is no abdominal tenderness. There is no guarding.  Musculoskeletal:        General: Normal range of motion.     Cervical back: Normal range of motion and neck supple.  Lymphadenopathy:     Cervical: No cervical adenopathy.  Skin:    General: Skin is warm and dry.  Neurological:     Mental Status: She is alert.     Wt Readings from Last 3 Encounters:  05/02/22 134 lb (60.8 kg)  04/21/22 130 lb (59 kg)  04/11/22 130 lb 12.8 oz (59.3 kg)    BP 120/78   Pulse (!) 58   Ht 5' 6"$  (1.676 m)   Wt 134 lb (60.8 kg)   SpO2 94%   BMI 21.63 kg/m   Assessment and Plan: 1. Type 2 diabetes mellitus without complication, with long-term current use of insulin (HCC) Chronic.  Controlled.  Stable.  Patient is doing well with alternating dosing pending days on steroids and days off steroids of Lantus 10 units daily 6 units every day except Wednesday and Thursday 8 units.  Will also continue metformin 1 g twice a day. - insulin glargine (LANTUS SOLOSTAR) 100 UNIT/ML Solostar Pen; Inject 10 Units into the skin daily. 6 units every day except Wed and Thursday- 8 units  Dispense: 15 mL; Refill: 0 - metFORMIN (GLUCOPHAGE) 1000 MG tablet; Take 1 tablet (1,000 mg  total) by mouth 2 (two) times daily.  Dispense: 180 tablet; Refill: 1  2. Gastroesophageal reflux disease without esophagitis Chronic.  Controlled.  Stable.  Continue pantoprazole 40 mg once a day. - pantoprazole (PROTONIX) 40 MG tablet; Take 1 tablet (40 mg total) by mouth daily.  Dispense: 90 tablet; Refill: 0  3. Hypercholesterolemia Patient with upcoming appointment in which she will have her lipid panel done in the meantime we will continue lovastatin at 40 mg q. evening. - lovastatin (MEVACOR) 40 MG tablet; Take 1 tablet (40 mg total) by mouth every evening.  Dispense: 90 tablet; Refill: 1     Otilio Miu, MD

## 2022-05-02 NOTE — Progress Notes (Signed)
Date:  05/02/2022   Name:  Cristina Morgan   DOB:  [birth year]   MRN:  [id number]   Chief Complaint: Diabetes (Will get labs drawn at Dr [facility] office)  Diabetes She presents for her follow-up diabetic visit. She has type 2 diabetes mellitus. Her disease course has been stable. There are no hypoglycemic associated symptoms. There are no diabetic associated symptoms. Pertinent negatives for diabetes include no chest pain. There are no hypoglycemic complications. Symptoms are stable. There are no diabetic complications. There are no known risk factors for coronary artery disease. Current diabetic treatment includes diet and insulin injections. She is compliant with treatment most of the time.    Lab Results  Component Value Date   NA 135 04/11/2022   K 3.8 04/11/2022   CO2 23 04/11/2022   GLUCOSE 150 (H) 04/11/2022   BUN 26 (H) 04/11/2022   CREATININE 1.01 (H) 04/11/2022   CALCIUM 10.4 (H) 04/11/2022   EGFR 68 02/01/2021   GFRNONAA 54 (L) 04/11/2022   Lab Results  Component Value Date   CHOL 176 07/24/2020   HDL 74 07/24/2020   LDLCALC 90 07/24/2020   TRIG 63 07/24/2020   CHOLHDL 2.7 03/01/2018   Lab Results  Component Value Date   TSH 0.367 05/03/2021   Lab Results  Component Value Date   HGBA1C 7.3 (H) 03/06/2022   Lab Results  Component Value Date   WBC 5.5 04/11/2022   HGB 10.4 (L) 04/11/2022   HCT 33.0 (L) 04/11/2022   MCV 100.9 (H) 04/11/2022   PLT 234 04/11/2022   Lab Results  Component Value Date   ALT 18 04/11/2022   AST 25 04/11/2022   ALKPHOS 85 04/11/2022   BILITOT 0.4 04/11/2022   No results found for: "25OHVITD2", "25OHVITD3", "VD25OH"   Review of Systems  Constitutional:  Negative for chills and fever.  HENT:  Negative for congestion and ear pain.   Respiratory:  Negative for cough, choking, chest tightness, shortness of breath and wheezing.   Cardiovascular:  Negative for chest pain, palpitations and leg swelling.  Genitourinary:  Negative for  difficulty urinating.    Patient Active Problem List   Diagnosis Date Noted   Multiple myeloma not having achieved remission ([place]) 05/15/2021   Pressure injury of coccygeal region, stage 2 ([place]) 05/07/2021   Hyponatremia 05/04/2021   Pressure injury of skin 05/02/2021   Vitamin B12 deficiency 05/02/2021   Hypomagnesemia 05/02/2021   Hypophosphatemia 05/02/2021   SVT (supraventricular tachycardia) 05/02/2021   Altered mental status 99991111   Acute metabolic encephalopathy 99991111   Hypercalcemia 05/01/2021   Closed compression fracture of second lumbar vertebra ([place]) 10/28/2020   Need for vaccination against Streptococcus pneumoniae using pneumococcal conjugate vaccine 13 01/07/2017   Primary osteoarthritis of right knee 12/08/2016   PNA (pneumonia) 07/09/2015   Acute chest pain 03/20/2014   Type 2 diabetes mellitus ([place]) 03/20/2014   Hypercholesterolemia 03/20/2014   Essential hypertension 03/20/2014    Allergies  Allergen Reactions   Latex Rash   Penicillins Other (See Comments)    Past Surgical History:  Procedure Laterality Date   CATARACT EXTRACTION Bilateral 2014   COLONOSCOPY  2011   cleared for 5 yrs- [facility]   ECTOPIC PREGNANCY SURGERY      Social History   Tobacco Use   Smoking status: Never    Passive exposure: Never   Smokeless tobacco: Never   Tobacco comments:    smoking cessation materials not required  Vaping Use  Vaping Use: Never used  Substance Use Topics   Alcohol use: No   Drug use: No     Medication list has been reviewed and updated.  Current Meds  Medication Sig   ACCU-CHEK GUIDE test strip USE 1 STRIP TO CHECK GLUCOSE ONCE DAILY AS DIRECTED   Accu-Chek Softclix Lancets lancets USE 1  TO CHECK GLUCOSE ONCE DAILY   acetaminophen (TYLENOL) 325 MG tablet Take 325 mg by mouth 2 (two) times daily. START 2 days prior to chemo infusion. Take For 2 days; Do NOT take on the day of infusion   acyclovir (ZOVIRAX) 400 MG tablet Take 1  tablet (400 mg total) by mouth 2 (two) times daily.   BD PEN NEEDLE NANO 2ND GEN 32G X 4 MM MISC USE 1  ONCE DAILY   Blood Glucose Monitoring Suppl (ACCU-CHEK GUIDE) w/Device KIT USE AS DIRECTED TO CHECK GLUCOSE   Calcium Carbonate-Vit D-Min (CALCIUM 1200 PO) Take 1 capsule by mouth daily.   carvedilol (COREG) 3.125 MG tablet TAKE ONE TABLET BY MOUTH TWICE DAILY with meals   cholecalciferol (VITAMIN D3) 25 MCG (1000 UNIT) tablet Take 1,000 Units by mouth daily.   dexamethasone (DECADRON) 4 MG tablet Take 1 tablet (4 mg total) by mouth 2 (two) times daily. Start 2 days prior to infusion; Take it for 2 days.   dexamethasone (DECADRON) 4 MG tablet Take 5 tablets (20 mg total) by mouth as directed. Take one hour before monthly Darzalex injections.   diphenoxylate-atropine (LOMOTIL) 2.5-0.025 MG tablet Take 1 tablet by mouth 4 (four) times daily as needed for diarrhea or loose stools. Take it along with immodium   Glucerna (GLUCERNA) LIQD Take 237 mLs by mouth.   hydrOXYzine (ATARAX) 10 MG tablet Take 1 tablet (10 mg total) by mouth at bedtime as needed.   Infant Foods (GOOD START 2 ESSENTIALS/IRON PO) Take 1 tablet by mouth daily.   insulin glargine (LANTUS SOLOSTAR) 100 UNIT/ML Solostar Pen Inject 10 Units into the skin daily. 6 units every day except Wed and Thursday- 8 units   lenalidomide (REVLIMID) 10 MG capsule Take 1 capsule (10 mg total) by mouth daily. Take for 21 days, then hold for 7 days. Repeat every 28 days.   losartan (COZAAR) 25 MG tablet TAKE ONE TABLET BY MOUTH EVERY EVENING   lovastatin (MEVACOR) 40 MG tablet Take 1 tablet (40 mg total) by mouth every evening.   metFORMIN (GLUCOPHAGE) 1000 MG tablet Take 1 tablet (1,000 mg total) by mouth 2 (two) times daily.   montelukast (SINGULAIR) 10 MG tablet TAKE ONE TABLET ONCE DAILY. start TWO DAYS prior TO infusion. take FOR TWO DAYS AND DO not take ON DAY of infusion   ondansetron (ZOFRAN) 8 MG tablet One pill every 8 hours as needed for  nausea/vomitting.   pantoprazole (PROTONIX) 40 MG tablet Take 1 tablet by mouth once daily   prochlorperazine (COMPAZINE) 10 MG tablet Take 1 tablet (10 mg total) by mouth every 6 (six) hours as needed for nausea or vomiting.   sertraline (ZOLOFT) 25 MG tablet TAKE ONE TABLET BY MOUTH ONCE DAILY   triamcinolone (NASACORT) 55 MCG/ACT AERO nasal inhaler 2 sprays 2 (two) times daily.       05/02/2022    2:10 PM 04/21/2022    3:59 PM 12/30/2021    2:39 PM 08/30/2021    2:10 PM  GAD 7 : Generalized Anxiety Score  Nervous, Anxious, on Edge 0 0 0 0  Control/stop worrying 0 0 0 0  Worry too much - different things 0 0 0 0  Trouble relaxing 0 0 0 1  Restless 0 0 0 0  Easily annoyed or irritable 0 0 0 0  Afraid - awful might happen 0 0 0 0  Total GAD 7 Score 0 0 0 1  Anxiety Difficulty Not difficult at all Not difficult at all Not difficult at all Not difficult at all       05/02/2022    2:09 PM 04/21/2022    3:59 PM 12/30/2021    2:38 PM  Depression screen PHQ 2/9  Decreased Interest 0 0 0  Down, Depressed, Hopeless 0 0 0  PHQ - 2 Score 0 0 0  Altered sleeping 0 0 0  Tired, decreased energy 0 0 0  Change in appetite 0 0 0  Feeling bad or failure about yourself  0 0 0  Trouble concentrating 0 0 0  Moving slowly or fidgety/restless 0 0 0  Suicidal thoughts 0 0 0  PHQ-9 Score 0 0 0  Difficult doing work/chores Not difficult at all Not difficult at all Not difficult at all    BP Readings from Last 3 Encounters:  05/02/22 120/78  04/21/22 120/68  04/11/22 (!) 138/50    Physical Exam Vitals and nursing note reviewed. Exam conducted with a chaperone present.  Constitutional:      General: She is not in acute distress.    Appearance: She is not diaphoretic.  HENT:     Head: Normocephalic and atraumatic.     Right Ear: Tympanic membrane and external ear normal.     Left Ear: Tympanic membrane and external ear normal.     Nose: Nose normal.  Eyes:     General:        Right eye:  No discharge.        Left eye: No discharge.     Conjunctiva/sclera: Conjunctivae normal.     Pupils: Pupils are equal, round, and reactive to light.  Neck:     Thyroid: No thyromegaly.     Vascular: No JVD.  Cardiovascular:     Rate and Rhythm: Normal rate and regular rhythm.     Heart sounds: Normal heart sounds. No murmur heard.    No friction rub. No gallop.  Pulmonary:     Effort: Pulmonary effort is normal.     Breath sounds: Normal breath sounds. No wheezing, rhonchi or rales.  Abdominal:     General: Bowel sounds are normal.     Palpations: Abdomen is soft. There is no mass.     Tenderness: There is no abdominal tenderness. There is no guarding or rebound.  Musculoskeletal:        General: Normal range of motion.     Cervical back: Normal range of motion and neck supple.  Lymphadenopathy:     Cervical: No cervical adenopathy.  Skin:    General: Skin is warm.  Neurological:     Mental Status: She is alert.     Wt Readings from Last 3 Encounters:  05/02/22 134 lb (60.8 kg)  04/21/22 130 lb (59 kg)  04/11/22 130 lb 12.8 oz (59.3 kg)    BP 120/78   Pulse (!) 58   Ht 5' 6"$  (1.676 m)   Wt 134 lb (60.8 kg)   SpO2 94%   BMI 21.63 kg/m   Assessment and Plan:     Otilio Miu, MD

## 2022-05-04 ENCOUNTER — Other Ambulatory Visit: Payer: Self-pay

## 2022-05-06 ENCOUNTER — Encounter: Payer: Self-pay | Admitting: Internal Medicine

## 2022-05-06 DIAGNOSIS — E119 Type 2 diabetes mellitus without complications: Secondary | ICD-10-CM

## 2022-05-06 DIAGNOSIS — E7849 Other hyperlipidemia: Secondary | ICD-10-CM

## 2022-05-07 ENCOUNTER — Other Ambulatory Visit: Payer: Self-pay | Admitting: *Deleted

## 2022-05-07 ENCOUNTER — Encounter: Payer: Self-pay | Admitting: Internal Medicine

## 2022-05-09 ENCOUNTER — Inpatient Hospital Stay: Payer: Medicare Other | Attending: Nurse Practitioner

## 2022-05-09 ENCOUNTER — Inpatient Hospital Stay: Payer: Medicare Other

## 2022-05-09 ENCOUNTER — Encounter: Payer: Self-pay | Admitting: Internal Medicine

## 2022-05-09 ENCOUNTER — Other Ambulatory Visit: Payer: Self-pay

## 2022-05-09 ENCOUNTER — Inpatient Hospital Stay (HOSPITAL_BASED_OUTPATIENT_CLINIC_OR_DEPARTMENT_OTHER): Payer: Medicare Other | Admitting: Internal Medicine

## 2022-05-09 VITALS — BP 138/54 | HR 69 | Temp 97.5°F | Resp 18 | Ht 66.0 in | Wt 128.2 lb

## 2022-05-09 DIAGNOSIS — C9000 Multiple myeloma not having achieved remission: Secondary | ICD-10-CM | POA: Diagnosis not present

## 2022-05-09 DIAGNOSIS — R197 Diarrhea, unspecified: Secondary | ICD-10-CM | POA: Diagnosis not present

## 2022-05-09 DIAGNOSIS — D649 Anemia, unspecified: Secondary | ICD-10-CM | POA: Insufficient documentation

## 2022-05-09 DIAGNOSIS — E7849 Other hyperlipidemia: Secondary | ICD-10-CM

## 2022-05-09 DIAGNOSIS — Z5112 Encounter for antineoplastic immunotherapy: Secondary | ICD-10-CM | POA: Insufficient documentation

## 2022-05-09 DIAGNOSIS — E119 Type 2 diabetes mellitus without complications: Secondary | ICD-10-CM

## 2022-05-09 DIAGNOSIS — I10 Essential (primary) hypertension: Secondary | ICD-10-CM | POA: Insufficient documentation

## 2022-05-09 LAB — COMPREHENSIVE METABOLIC PANEL
ALT: 18 U/L (ref 0–44)
AST: 24 U/L (ref 15–41)
Albumin: 3.7 g/dL (ref 3.5–5.0)
Alkaline Phosphatase: 59 U/L (ref 38–126)
Anion gap: 10 (ref 5–15)
BUN: 19 mg/dL (ref 8–23)
CO2: 24 mmol/L (ref 22–32)
Calcium: 9.1 mg/dL (ref 8.9–10.3)
Chloride: 101 mmol/L (ref 98–111)
Creatinine, Ser: 1 mg/dL (ref 0.44–1.00)
GFR, Estimated: 55 mL/min — ABNORMAL LOW (ref >60)
Glucose, Bld: 164 mg/dL — ABNORMAL HIGH (ref 70–99)
Potassium: 3.9 mmol/L (ref 3.5–5.1)
Sodium: 135 mmol/L (ref 135–145)
Total Bilirubin: 0.6 mg/dL (ref 0.3–1.2)
Total Protein: 6.2 g/dL — ABNORMAL LOW (ref 6.5–8.1)

## 2022-05-09 LAB — CBC WITH DIFFERENTIAL/PLATELET
Abs Immature Granulocytes: 0.02 10*3/uL (ref 0.00–0.07)
Basophils Absolute: 0.1 10*3/uL (ref 0.0–0.1)
Basophils Relative: 2 %
Eosinophils Absolute: 0 10*3/uL (ref 0.0–0.5)
Eosinophils Relative: 0 %
HCT: 29.4 % — ABNORMAL LOW (ref 36.0–46.0)
Hemoglobin: 9.4 g/dL — ABNORMAL LOW (ref 12.0–15.0)
Immature Granulocytes: 1 %
Lymphocytes Relative: 29 %
Lymphs Abs: 1.1 10*3/uL (ref 0.7–4.0)
MCH: 32.1 pg (ref 26.0–34.0)
MCHC: 32 g/dL (ref 30.0–36.0)
MCV: 100.3 fL — ABNORMAL HIGH (ref 80.0–100.0)
Monocytes Absolute: 0.5 10*3/uL (ref 0.1–1.0)
Monocytes Relative: 12 %
Neutro Abs: 2.1 10*3/uL (ref 1.7–7.7)
Neutrophils Relative %: 56 %
Platelets: 314 10*3/uL (ref 150–400)
RBC: 2.93 MIL/uL — ABNORMAL LOW (ref 3.87–5.11)
RDW: 17.1 % — ABNORMAL HIGH (ref 11.5–15.5)
WBC: 3.7 10*3/uL — ABNORMAL LOW (ref 4.0–10.5)
nRBC: 0 % (ref 0.0–0.2)

## 2022-05-09 LAB — FERRITIN: Ferritin: 192 ng/mL (ref 11–307)

## 2022-05-09 LAB — LIPID PANEL
Cholesterol: 146 mg/dL (ref 0–200)
HDL: 67 mg/dL (ref >40)
LDL Cholesterol: 74 mg/dL (ref 0–99)
Total CHOL/HDL Ratio: 2.2 RATIO
Triglycerides: 26 mg/dL (ref <150)
VLDL: 5 mg/dL (ref 0–40)

## 2022-05-09 LAB — HEMOGLOBIN A1C
Hgb A1c MFr Bld: 7.2 % — ABNORMAL HIGH (ref 4.8–5.6)
Mean Plasma Glucose: 159.94 mg/dL

## 2022-05-09 LAB — IRON AND TIBC
Iron: 42 ug/dL (ref 28–170)
Saturation Ratios: 13 % (ref 10.4–31.8)
TIBC: 325 ug/dL (ref 250–450)
UIBC: 283 ug/dL

## 2022-05-09 MED ORDER — DIPHENHYDRAMINE HCL 25 MG PO CAPS
50.0000 mg | ORAL_CAPSULE | Freq: Once | ORAL | Status: AC
  Administered 2022-05-09: 50 mg via ORAL
  Filled 2022-05-09: qty 2

## 2022-05-09 MED ORDER — DEXAMETHASONE 4 MG PO TABS: 4.0000 mg | ORAL_TABLET | Freq: Once | ORAL | Status: DC

## 2022-05-09 MED ORDER — DARATUMUMAB-HYALURONIDASE-FIHJ 1800-30000 MG-UT/15ML ~~LOC~~ SOLN
1800.0000 mg | Freq: Once | SUBCUTANEOUS | Status: AC
  Administered 2022-05-09: 1800 mg via SUBCUTANEOUS
  Filled 2022-05-09: qty 15

## 2022-05-09 MED ORDER — DIPHENOXYLATE-ATROPINE 2.5-0.025 MG PO TABS: 1.0000 | ORAL_TABLET | Freq: Four times a day (QID) | ORAL | 3 refills | Status: DC | PRN

## 2022-05-09 MED ORDER — ACETAMINOPHEN 325 MG PO TABS
650.0000 mg | ORAL_TABLET | Freq: Once | ORAL | Status: AC
  Administered 2022-05-09: 650 mg via ORAL
  Filled 2022-05-09: qty 2

## 2022-05-09 NOTE — Patient Instructions (Signed)
[facility]  Discharge Instructions: Thank you for choosing [facility] to provide your oncology and hematology care.  If you have a lab appointment with the [facility], please go directly to the [facility] and check in at the registration area.  Wear comfortable clothing and clothing appropriate for easy access to any Portacath or PICC line.   We strive to give you quality time with your provider. You may need to reschedule your appointment if you arrive late (15 or more minutes).  Arriving late affects you and other patients whose appointments are after yours.  Also, if you miss three or more appointments without notifying the office, you may be dismissed from the clinic at the provider's discretion.      For prescription refill requests, have your pharmacy contact our office and allow 72 hours for refills to be completed.    Today you received the following chemotherapy and/or immunotherapy agents DARZALEX      To help prevent nausea and vomiting after your treatment, we encourage you to take your nausea medication as directed.  BELOW ARE SYMPTOMS THAT SHOULD BE REPORTED IMMEDIATELY: *FEVER GREATER THAN 100.4 F (38 C) OR HIGHER *CHILLS OR SWEATING *NAUSEA AND VOMITING THAT IS NOT CONTROLLED WITH YOUR NAUSEA MEDICATION *UNUSUAL SHORTNESS OF BREATH *UNUSUAL BRUISING OR BLEEDING *URINARY PROBLEMS (pain or burning when urinating, or frequent urination) *BOWEL PROBLEMS (unusual diarrhea, constipation, pain near the anus) TENDERNESS IN MOUTH AND THROAT WITH OR WITHOUT PRESENCE OF ULCERS (sore throat, sores in mouth, or a toothache) UNUSUAL RASH, SWELLING OR PAIN  UNUSUAL VAGINAL DISCHARGE OR ITCHING   Items with * indicate a potential emergency and should be followed up as soon as possible or go to the Emergency Department if any problems should occur.  Please show the CHEMOTHERAPY ALERT CARD or IMMUNOTHERAPY ALERT CARD at check-in to  the Emergency Department and triage nurse.  Should you have questions after your visit or need to cancel or reschedule your appointment, please contact [facility]  [phone] and follow the prompts.  Office hours are 8:00 a.m. to 4:30 p.m. Monday - Friday. Please note that voicemails left after 4:00 p.m. may not be returned until the following business day.  We are closed weekends and major holidays. You have access to a nurse at all times for urgent questions. Please call the main number to the clinic [phone] and follow the prompts.  For any non-urgent questions, you may also contact your provider using MyChart. We now offer e-Visits for anyone 9 and older to request care online for non-urgent symptoms. For details visit [email or web address].   Also download the MyChart app! Go to the app store, search "MyChart", open the app, select [facility], and log in with your MyChart username and password.  Daratumumab Injection What is this medication? DARATUMUMAB (dar a toom ue mab) treats multiple myeloma, a type of bone marrow cancer. It works by helping your immune system slow or stop the spread of cancer cells. It is a monoclonal antibody. This medicine may be used for other purposes; ask your health care provider or pharmacist if you have questions. COMMON BRAND NAME(S): DARZALEX What should I tell my care team before I take this medication? They need to know if you have any of these conditions: Hereditary fructose intolerance Infection, such as chickenpox, herpes, hepatitis B Lung or breathing disease, such as asthma, COPD An unusual or allergic reaction to daratumumab, sorbitol, other  medications, foods, dyes, or preservatives Pregnant or trying to get pregnant Breastfeeding How should I use this medication? This medication is injected into a vein. It is given by your care team in a hospital or clinic setting. Talk to your care team about the use  of this medication in children. Special care may be needed. Overdosage: If you think you have taken too much of this medicine contact a poison control center or emergency room at once. NOTE: This medicine is only for you. Do not share this medicine with others. What if I miss a dose? Keep appointments for follow-up doses. It is important not to miss your dose. Call your care team if you are unable to keep an appointment. What may interact with this medication? Interactions have not been studied. This list may not describe all possible interactions. Give your health care provider a list of all the medicines, herbs, non-prescription drugs, or dietary supplements you use. Also tell them if you smoke, drink alcohol, or use illegal drugs. Some items may interact with your medicine. What should I watch for while using this medication? Your condition will be monitored carefully while you are receiving this medication. This medication can cause serious allergic reactions. To reduce your risk, your care team may give you other medication to take before receiving this one. Be sure to follow the directions from your care team. This medication can affect the results of blood tests to match your blood type. These changes can last for up to 6 months after the final dose. Your care team will do blood tests to match your blood type before you start treatment. Tell all of your care team that you are being treated with this medication before receiving a blood transfusion. This medication can affect the results of some tests used to determine treatment response; extra tests may be needed to evaluate response. Talk to your care team if you wish to become pregnant or think you are pregnant. This medication can cause serious birth defects if taken during pregnancy and for 3 months after the last dose. A reliable form of contraception is recommended while taking this medication and for 3 months after the last dose. Talk to  your care team about effective forms of contraception. Do not breast-feed while taking this medication. What side effects may I notice from receiving this medication? Side effects that you should report to your care team as soon as possible: Allergic reactions--skin rash, itching, hives, swelling of the face, lips, tongue, or throat Infection--fever, chills, cough, sore throat, wounds that don't heal, pain or trouble when passing urine, general feeling of discomfort or being unwell Infusion reactions--chest pain, shortness of breath or trouble breathing, feeling faint or lightheaded Unusual bruising or bleeding Side effects that usually do not require medical attention (report to your care team if they continue or are bothersome): Constipation Diarrhea Fatigue Nausea Pain, tingling, or numbness in the hands or feet Swelling of the ankles, hands, or feet This list may not describe all possible side effects. Call your doctor for medical advice about side effects. You may report side effects to FDA at 1-800-FDA-1088. Where should I keep my medication? This medication is given in a hospital or clinic. It will not be stored at home. NOTE: This sheet is a summary. It may not cover all possible information. If you have questions about this medicine, talk to your doctor, pharmacist, or health care provider.  2023 Elsevier/Gold Standard (2021-06-26 00:00:00)

## 2022-05-09 NOTE — Progress Notes (Signed)
[place] NOTE  Patient Care Team: Juline Patch, MD as PCP - General (Family Medicine) Vladimir Faster, [facility] (Inactive) (Pharmacist) Cammie Sickle, MD as Consulting Physician (Oncology)  CHIEF COMPLAINTS/PURPOSE OF CONSULTATION: Multiple myeloma  Oncology History Overview Note  # MULTIPLE MYELOMA  [FEB 2023-hypercalcemia status changes]-Active [bone lesions; hypercalcemia; anemia; No renal insuffiencey;Bone marrow-32% plasma cell-   STANDARD Cytogenetics].FEB 2023- S/p dexamethasone 20 mg q day x4.   # REV [15 mg -3 week-On and 1 week-OFF]-DARA; May 2023-discontinued rev 15 diarrhea/hypotension  # MAY 2023- dara-Rev 10 mg 3w/1w   # FEB 2023-hypercalcemia [[facility]-status post calcitonin; bisphosphonate]  BONE MARROW, ASPIRATE, CLOT, CORE:  -Hypercellular bone marrow with plasma cell neoplasm  -See comment   PERIPHERAL BLOOD:  -Normocytic-normochromic anemia   COMMENT:   The bone marrow is hypercellular for age with increased number of  atypical plasma cells representing 32% of all cells in the aspirate  associated with interstitial infiltrates and numerous variably sized  clusters in the clot/biopsy sections.  The plasma cells display weak  kappa light chain restriction consistent with plasma cell neoplasm.  Correlation with cytogenetic and FISH studies is recommended   MICROSCOPIC DESCRIPTION:   PERIPHERAL BLOOD SMEAR: The red blood cells display mild  anisopoikilocytosis with mild polychromasia.  The white blood cells are  normal number with scattered hypogranular neutrophils. An occasional  myelocyte and large atypical mononuclear cells are seen on scan. The  platelets are normal in number.    Multiple myeloma not having achieved remission ([place])  05/15/2021 Initial Diagnosis   Multiple myeloma not having achieved remission ([place])   06/27/2021 - 10/17/2021 Chemotherapy   Patient is on Treatment Plan : MYELOMA Daratumumab SQ + Lenalidomide +  Dexamethasone (DaraRd) q28d     06/27/2021 -  Chemotherapy   Patient is on Treatment Plan : MYELOMA RELAPSED REFRACTORY Daratumumab SQ + Lenalidomide + Dexamethasone (DaraRd) q28d     07/11/2021 Cancer Staging   Staging form: Plasma Cell Myeloma and Plasma Cell Disorders, AJCC 8th Edition - Clinical: Beta-2-microglobulin (mg/L): 2.6, Albumin (g/dL): 3.9, ISS: Stage I - Signed by Cammie Sickle, MD on 07/11/2021 Stage prefix: Initial diagnosis Beta 2 microglobulin range (mg/L): Less than 3.5 Albumin range (g/dL): Greater than or equal to 3.5     HISTORY OF PRESENTING ILLNESS: pt in a wheel chair; accompanied by her daughter.   Cristina Morgan 88 y.o.  female with  multiple myeloma currently on Rev-Dex is proceed with Dara SQ today.    In the interim patient s/p evaluation with nutrition.   Recent bronchitis- s/p antibiotcs. Resolved.    Patient noted to have improvement of her reflux since she started taking her Protonix.   Chronic mild fatigue.  Otherwise no worsening shortness of breath or cough. Diarrhea well controlled with last loose stool last week improved with Lomotil.   Denies any nausea vomiting.  Denies any blood in stools or black-colored stools.  Patient states her back pain is improved.  Denies any worsening joint pains.  No falls.    Review of Systems  Constitutional:  Positive for malaise/fatigue and weight loss. Negative for chills, diaphoresis and fever.  HENT:  Negative for nosebleeds and sore throat.   Eyes:  Negative for double vision.  Respiratory:  Negative for cough, hemoptysis, sputum production, shortness of breath and wheezing.   Cardiovascular:  Negative for chest pain, palpitations, orthopnea and leg swelling.  Gastrointestinal:  Negative for abdominal pain, blood in stool, constipation, diarrhea,  heartburn, melena, nausea and vomiting.  Genitourinary:  Negative for dysuria, frequency and urgency.  Musculoskeletal:  Positive for back pain and joint  pain.  Skin: Negative.  Negative for itching and rash.  Neurological:  Negative for dizziness, tingling, focal weakness, weakness and headaches.  Endo/Heme/Allergies:  Does not bruise/bleed easily.  Psychiatric/Behavioral:  Negative for depression. The patient is not nervous/anxious and does not have insomnia.      MEDICAL HISTORY:  Past Medical History:  Diagnosis Date   Anemia    CAD (coronary artery disease)    Cataracts, bilateral    Closed compression fracture of first lumbar vertebra (HCC)    Closed compression fracture of second lumbar vertebra (HCC)    Diabetes mellitus without complication (HCC)    High cholesterol    History of pneumonia 2010   [place]   History of uterine fibroid    Hypercalcemia    Hyperlipidemia    Hypertension    Lytic bone lesions on xray    Multiple myeloma (HCC)    Osteoporosis    Primary osteoarthritis of right knee     SURGICAL HISTORY: Past Surgical History:  Procedure Laterality Date   CATARACT EXTRACTION Bilateral 2014   COLONOSCOPY  2011   cleared for 5 yrs- [facility]   ECTOPIC PREGNANCY SURGERY      SOCIAL HISTORY: Social History   Socioeconomic History   Marital status: Married    Spouse name: Not on file   Number of children: 2   Years of education: Not on file   Highest education level: 12th grade  Occupational History   Occupation: Retired  Tobacco Use   Smoking status: Never    Passive exposure: Never   Smokeless tobacco: Never   Tobacco comments:    smoking cessation materials not required  Vaping Use   Vaping Use: Never used  Substance and Sexual Activity   Alcohol use: No   Drug use: No   Sexual activity: Not Currently  Other Topics Concern   Not on file  Social History Narrative   [place] with husband; drives. Never smoked; no alcohol. Daughters live close.    Social Determinants of Health   Financial Resource Strain: Low Risk  (11/13/2021)   Overall Financial Resource Strain (CARDIA)     Difficulty of Paying Living Expenses: Not hard at all  Food Insecurity: No Food Insecurity (11/13/2021)   Hunger Vital Sign    Worried About Running Out of Food in the Last Year: Never true    Ran Out of Food in the Last Year: Never true  Transportation Needs: No Transportation Needs (11/13/2021)   PRAPARE - Hydrologist (Medical): No    Lack of Transportation (Non-Medical): No  Physical Activity: Sufficiently Active (11/13/2021)   Exercise Vital Sign    Days of Exercise per Week: 7 days    Minutes of Exercise per Session: 30 min  Stress: No Stress Concern Present (11/13/2021)   [place]    Feeling of Stress : Not at all  Social Connections: Moderately Integrated (11/13/2021)   Social Connection and Isolation Panel [NHANES]    Frequency of Communication with Friends and Family: Twice a week    Frequency of Social Gatherings with Friends and Family: More than three times a week    Attends Religious Services: 1 to 4 times per year    Active Member of [employer] or Organizations: No    Attends Club or  Organization Meetings: Never    Marital Status: Married  Human resources officer Violence: Not At Risk (11/13/2021)   Humiliation, Afraid, Rape, and Kick questionnaire    Fear of Current or Ex-Partner: No    Emotionally Abused: No    Physically Abused: No    Sexually Abused: No    FAMILY HISTORY: Family History  Problem Relation Age of Onset   Cancer Mother        breast   Heart disease Mother    Diabetes Father    Heart disease Father    Heart attack Father    Heart attack Brother    Diabetes Brother     ALLERGIES:  is allergic to latex and penicillins.  MEDICATIONS:  Current Outpatient Medications  Medication Sig Dispense Refill   ACCU-CHEK GUIDE test strip USE 1 STRIP TO CHECK GLUCOSE ONCE DAILY AS DIRECTED 100 each 0   Accu-Chek Softclix Lancets lancets USE 1  TO CHECK GLUCOSE ONCE DAILY 100  each 0   acetaminophen (TYLENOL) 325 MG tablet Take 325 mg by mouth 2 (two) times daily. START 2 days prior to chemo infusion. Take For 2 days; Do NOT take on the day of infusion     acyclovir (ZOVIRAX) 400 MG tablet Take 1 tablet (400 mg total) by mouth 2 (two) times daily. 60 tablet 4   BD PEN NEEDLE NANO 2ND GEN 32G X 4 MM MISC USE 1  ONCE DAILY 100 each 0   Blood Glucose Monitoring Suppl (ACCU-CHEK GUIDE) w/Device KIT USE AS DIRECTED TO CHECK GLUCOSE     Calcium Carbonate-Vit D-Min (CALCIUM 1200 PO) Take 1 capsule by mouth daily.     carvedilol (COREG) 3.125 MG tablet TAKE ONE TABLET BY MOUTH TWICE DAILY with meals 60 tablet 1   cholecalciferol (VITAMIN D3) 25 MCG (1000 UNIT) tablet Take 1,000 Units by mouth daily.     dexamethasone (DECADRON) 4 MG tablet Take 1 tablet (4 mg total) by mouth 2 (two) times daily. Start 2 days prior to infusion; Take it for 2 days. 60 tablet 3   dexamethasone (DECADRON) 4 MG tablet Take 5 tablets (20 mg total) by mouth as directed. Take one hour before monthly Darzalex injections. 15 tablet 3   Glucerna (GLUCERNA) LIQD Take 237 mLs by mouth.     hydrOXYzine (ATARAX) 10 MG tablet Take 1 tablet (10 mg total) by mouth at bedtime as needed. 90 tablet 1   Infant Foods (GOOD START 2 ESSENTIALS/IRON PO) Take 1 tablet by mouth daily.     insulin glargine (LANTUS SOLOSTAR) 100 UNIT/ML Solostar Pen Inject 10 Units into the skin daily. 6 units every day except Wed and Thursday- 8 units 15 mL 0   lenalidomide (REVLIMID) 10 MG capsule Take 1 capsule (10 mg total) by mouth daily. Take for 21 days, then hold for 7 days. Repeat every 28 days. 21 capsule 0   losartan (COZAAR) 25 MG tablet TAKE ONE TABLET BY MOUTH EVERY EVENING 90 tablet 0   lovastatin (MEVACOR) 40 MG tablet Take 1 tablet (40 mg total) by mouth every evening. 90 tablet 1   metFORMIN (GLUCOPHAGE) 1000 MG tablet Take 1 tablet (1,000 mg total) by mouth 2 (two) times daily. 180 tablet 1   montelukast (SINGULAIR) 10 MG  tablet TAKE ONE TABLET ONCE DAILY. start TWO DAYS prior TO infusion. take FOR TWO DAYS AND DO not take ON DAY of infusion 20 tablet 1   ondansetron (ZOFRAN) 8 MG tablet One pill every 8 hours as  needed for nausea/vomitting. 40 tablet 1   pantoprazole (PROTONIX) 40 MG tablet Take 1 tablet (40 mg total) by mouth daily. 90 tablet 0   prochlorperazine (COMPAZINE) 10 MG tablet Take 1 tablet (10 mg total) by mouth every 6 (six) hours as needed for nausea or vomiting. 40 tablet 1   sertraline (ZOLOFT) 25 MG tablet TAKE ONE TABLET BY MOUTH ONCE DAILY 90 tablet 1   triamcinolone (NASACORT) 55 MCG/ACT AERO nasal inhaler 2 sprays 2 (two) times daily.     diphenoxylate-atropine (LOMOTIL) 2.5-0.025 MG tablet Take 1 tablet by mouth 4 (four) times daily as needed for diarrhea or loose stools. Take it along with immodium 60 tablet 3   No current facility-administered medications for this visit.   Facility-Administered Medications Ordered in Other Visits  Medication Dose Route Frequency Provider Last Rate Last Admin   daratumumab-hyaluronidase-fihj (DARZALEX FASPRO) 1800-30000 MG-UT/15ML chemo SQ injection 1,800 mg  1,800 mg Subcutaneous Once Shalise Rosado R, MD       dexamethasone (DECADRON) tablet 4 mg  4 mg Oral Once Charlaine Dalton R, MD          .  PHYSICAL EXAMINATION:  Vitals:   05/09/22 0900  BP: (!) 138/54  Pulse: 69  Resp: 18  Temp: (!) 97.5 F (36.4 C)    Filed Weights   05/09/22 0900  Weight: 128 lb 3.2 oz (58.2 kg)     Physical Exam Vitals and nursing note reviewed.  HENT:     Head: Normocephalic and atraumatic.     Mouth/Throat:     Pharynx: Oropharynx is clear.  Eyes:     Extraocular Movements: Extraocular movements intact.     Pupils: Pupils are equal, round, and reactive to light.  Cardiovascular:     Rate and Rhythm: Normal rate and regular rhythm.  Pulmonary:     Comments: Decreased breath sounds bilaterally.  Abdominal:     Palpations: Abdomen is soft.   Musculoskeletal:        General: Normal range of motion.     Cervical back: Normal range of motion.  Skin:    General: Skin is warm.  Neurological:     General: No focal deficit present.     Mental Status: She is alert and oriented to person, place, and time.  Psychiatric:        Behavior: Behavior normal.        Judgment: Judgment normal.      LABORATORY DATA:  I have reviewed the data as listed Lab Results  Component Value Date   WBC 3.7 (L) 05/09/2022   HGB 9.4 (L) 05/09/2022   HCT 29.4 (L) 05/09/2022   MCV 100.3 (H) 05/09/2022   PLT 314 05/09/2022   Recent Labs    03/14/22 1102 04/11/22 0836 05/09/22 0849  NA 139 135 135  K 3.6 3.8 3.9  CL 107 102 101  CO2 '25 23 24  '$ GLUCOSE 105* 150* 164*  BUN 20 26* 19  CREATININE 1.18* 1.01* 1.00  CALCIUM 9.4 10.4* 9.1  GFRNONAA 45* 54* 55*  PROT 6.3* 7.6 6.2*  ALBUMIN 3.4* 4.5 3.7  AST '26 25 24  '$ ALT '22 18 18  '$ ALKPHOS 55 85 59  BILITOT 0.4 0.4 0.6    RADIOGRAPHIC STUDIES: I have personally reviewed the radiological images as listed and agreed with the findings in the report. No results found.  Multiple myeloma not having achieved remission ([place]) # STAGE I- MULTIPLE MYELOMA -Active [bone lesions; hypercalcemia; anemia; No renal insuffiencey;Bone marrow-32%  plasma cell-   STANDARD Cytogenetics].  Currently on Revlimid-Dex- Dara SQ. Partial response noted;  JAN 2023-M protein = NEG;  K/L RATIO= 1. 7 = WNL;  stable.   # Proceed with Dara SQ today + DEX 20 mg PO [at home/pre-meds.]; Labs today reviewed;  acceptable for treatment today. On Revlimid at 10 mg dose 3 weeks on 1 week off; continue weekly -Dex 4 mg weekly [reflux like sypmtoms- see below].   Stable.   #Reflux-like symptoms-  Secondary to dexamethasone; on PPI- continue low-dose 4 mg weekly/WED.  and wants to hold off GI evaluation [per daughter]- improved. stable  # Anemia- Hb 8.19 Feb 2022- Iron sat-11 Ferritin- 26; continue trial of gentle iron. Hb [JAn 2024]-  10.4- improved; Proceed with venofer today. Pending- iron studies, ferritin/cbc- pending.   # Diarrhea- improved on Revlimid 10 mg a day- 3 weeks-ON & 1 week OFF. Stable; refilled lomotil  # Diabetes-on oral hypoglycemic agents; on long acting insulin; 14 units of lantus on T/W/Thursday- Defer to PCP. PBF- BG-164table  # HTN: continue losartan; continue coreg; BP- 150-160- refilled coreg; stable  # Hypercalcemia secondary to multiple myeloma- [FEB 2023-Zometa ; calcitonin]-calcium today is 9.6. Zometa q 31M; stable  # Weight loss-s/p re-veluation with Joli - stable  #DVT/shingles prophylaxis: Aspirin/acyclovir.stable  # Vaccinations; Ok with flu/COVID shot  # IV access:PIV   [name]  zometa-q 3 months [next March 2024]- ; MM; K/L q 4 W  # DISPOSITION:  # proceed with  dara SQ; venofer- wait for cbc   # follow up in 4 weeks/Friday  MD-Dr.A; labs- cbc/cmp MM panel;K/L light chains;  Dara SQ; possible venofer; Zometa  ;Dr.B     All questions were answered. The patient knows to call the clinic with any problems, questions or concerns.       Cammie Sickle, MD 05/09/2022 10:29 AM

## 2022-05-09 NOTE — Progress Notes (Signed)
Nutrition Follow-up:  Patient with multiple myeloma, currently on darzalex.  Met with patient during infusion.  Patient reports that she has been on antibiotics for a upper respiratory infection.  Has had issues with diarrhea last week but better.  Says that her appetite is good.  Eating small meals during the day.  Ate chicken with sweet potatoes and green peas yesterday for dinner.  Lunch was sandwich and glucerna shake.  Snacked on nuts and fruit.  Breakfast has been eggs, onion and sausage.     Medications: reviewed  Labs: reviewed  Anthropometrics:   128 lb today (wearing compression hose for swelling) 130 lb 12.8 oz on 1/26 125 lb on 1/11 135 lb on 12/1 (swelling) 124 lb on 10/27 127 lb on 9/28   NUTRITION DIAGNOSIS: Inadequate oral intake improving   INTERVENTION:  Continue strategies for adding calories and protein in diet Continue glucerna shakes    MONITORING, EVALUATION,GOAL: weight trends, intake   NEXT VISIT: Friday, Mar 22 during infusion  Shaima Sardinas B. [name], [place], [place] Registered Dietitian [phone]

## 2022-05-09 NOTE — Assessment & Plan Note (Addendum)
#   STAGE I- MULTIPLE MYELOMA -Active [bone lesions; hypercalcemia; anemia; No renal insuffiencey;Bone marrow-32% plasma cell-   STANDARD Cytogenetics].  Currently on Revlimid-Dex- Dara SQ. Partial response noted;  JAN 2023-M protein = NEG;  K/L RATIO= 1. 7 = WNL;  stable.   # Proceed with Dara SQ today + DEX 20 mg PO [at home/pre-meds.]; Labs today reviewed;  acceptable for treatment today. On Revlimid at 10 mg dose 3 weeks on 1 week off; continue weekly -Dex 4 mg weekly [reflux like sypmtoms- see below].   Stable.   #Reflux-like symptoms-  Secondary to dexamethasone; on PPI- continue low-dose 4 mg weekly/WED.  and wants to hold off GI evaluation [per daughter]- improved. stable  # Anemia- Hb 8.19 Feb 2022- Iron sat-11 Ferritin- 26; continue trial of gentle iron. Hb [JAn 2024]- 10.4- improved; Proceed with venofer today. Pending- iron studies, ferritin/cbc- pending.   # Diarrhea- improved on Revlimid 10 mg a day- 3 weeks-ON & 1 week OFF. Stable; refilled lomotil  # Diabetes-on oral hypoglycemic agents; on long acting insulin; 14 units of lantus on T/W/Thursday- Defer to PCP. PBF- BG-164table  # HTN: continue losartan; continue coreg; BP- 150-160- refilled coreg; stable  # Hypercalcemia secondary to multiple myeloma- [FEB 2023-Zometa ; calcitonin]-calcium today is 9.6. Zometa q 22M; stable  # Weight loss-s/p re-veluation with Joli - stable  #DVT/shingles prophylaxis: Aspirin/acyclovir.stable  # Vaccinations; Ok with flu/COVID shot  # IV access:PIV   [name]  zometa-q 3 months [next March 2024]- ; MM; K/L q 4 W  # DISPOSITION:  # proceed with  dara SQ; venofer- wait for cbc   # follow up in 4 weeks/Friday  MD-Dr.A; labs- cbc/cmp MM panel;K/L light chains;  Dara SQ; possible venofer; Zometa  ;Dr.B

## 2022-05-09 NOTE — Progress Notes (Signed)
Patient had diarrhea last week and being treated for URI.  Has a 6 lb wt loss despite no decrease in appetite.  Requesting refill on Lomotil for prn use (pended for MD review).

## 2022-05-12 LAB — KAPPA/LAMBDA LIGHT CHAINS
Kappa free light chain: 13.6 mg/L (ref 3.3–19.4)
Kappa, lambda light chain ratio: 1.31 (ref 0.26–1.65)
Lambda free light chains: 10.4 mg/L (ref 5.7–26.3)

## 2022-05-14 ENCOUNTER — Other Ambulatory Visit: Payer: Self-pay | Admitting: Internal Medicine

## 2022-05-14 DIAGNOSIS — F329 Major depressive disorder, single episode, unspecified: Secondary | ICD-10-CM

## 2022-05-14 DIAGNOSIS — F5102 Adjustment insomnia: Secondary | ICD-10-CM

## 2022-05-14 DIAGNOSIS — I10 Essential (primary) hypertension: Secondary | ICD-10-CM

## 2022-05-14 LAB — MULTIPLE MYELOMA PANEL, SERUM
Albumin SerPl Elph-Mcnc: 3.8 g/dL (ref 2.9–4.4)
Albumin/Glob SerPl: 2 — ABNORMAL HIGH (ref 0.7–1.7)
Alpha 1: 0.2 g/dL (ref 0.0–0.4)
Alpha2 Glob SerPl Elph-Mcnc: 0.6 g/dL (ref 0.4–1.0)
B-Globulin SerPl Elph-Mcnc: 0.7 g/dL (ref 0.7–1.3)
Gamma Glob SerPl Elph-Mcnc: 0.5 g/dL (ref 0.4–1.8)
Globulin, Total: 2 g/dL — ABNORMAL LOW (ref 2.2–3.9)
IgA: 74 mg/dL (ref 64–422)
IgG (Immunoglobin G), Serum: 582 mg/dL — ABNORMAL LOW (ref 586–1602)
IgM (Immunoglobulin M), Srm: 24 mg/dL — ABNORMAL LOW (ref 26–217)
Total Protein ELP: 5.8 g/dL — ABNORMAL LOW (ref 6.0–8.5)

## 2022-05-22 ENCOUNTER — Other Ambulatory Visit: Payer: Self-pay | Admitting: Family Medicine

## 2022-05-22 DIAGNOSIS — E119 Type 2 diabetes mellitus without complications: Secondary | ICD-10-CM

## 2022-05-22 DIAGNOSIS — I10 Essential (primary) hypertension: Secondary | ICD-10-CM

## 2022-05-23 ENCOUNTER — Telehealth: Payer: Self-pay | Admitting: *Deleted

## 2022-05-23 ENCOUNTER — Other Ambulatory Visit: Payer: Self-pay

## 2022-05-23 DIAGNOSIS — C9000 Multiple myeloma not having achieved remission: Secondary | ICD-10-CM

## 2022-05-23 DIAGNOSIS — H903 Sensorineural hearing loss, bilateral: Secondary | ICD-10-CM | POA: Diagnosis not present

## 2022-05-23 DIAGNOSIS — H6123 Impacted cerumen, bilateral: Secondary | ICD-10-CM | POA: Diagnosis not present

## 2022-05-23 NOTE — Telephone Encounter (Signed)
Pharmacy needs refill for pt. Team already knows

## 2022-05-23 NOTE — Telephone Encounter (Signed)
Requested Prescriptions  Pending Prescriptions Disp Refills   losartan (COZAAR) 25 MG tablet [Pharmacy Med Name: losartan 25 mg tablet] 90 tablet 1    Sig: TAKE ONE TABLET BY MOUTH EVERY EVENING     Cardiovascular:  Angiotensin Receptor Blockers Passed - 05/22/2022  4:24 PM      Passed - Cr in normal range and within 180 days    Creatinine, Ser  Date Value Ref Range Status  05/09/2022 1.00 0.44 - 1.00 mg/dL Final         Passed - K in normal range and within 180 days    Potassium  Date Value Ref Range Status  05/09/2022 3.9 3.5 - 5.1 mmol/L Final         Passed - Patient is not pregnant      Passed - Last BP in normal range    BP Readings from Last 1 Encounters:  05/09/22 (!) 138/54         Passed - Valid encounter within last 6 months    Recent Outpatient Visits           3 weeks ago Type 2 diabetes mellitus without complication, with long-term current use of insulin ([place])   [facility] [place], Deanna C, MD   1 month ago Acute sinusitis, recurrence not specified, unspecified location   [facility], MD   4 months ago Type 2 diabetes mellitus without complication, with long-term current use of insulin ([place])   [place], MD   8 months ago Type 2 diabetes mellitus with other specified complication, with long-term current use of insulin ([place])   [place] [facility] at [facility], MD   10 months ago Type 2 diabetes mellitus without complication, without long-term current use of insulin ([place])   [place] [facility] [place], Deanna C, MD       Future Appointments             In 3 weeks Lucilla Lame, MD [facility]   In 3 months Juline Patch, MD [place] at [facility], [facility]

## 2022-05-26 MED ORDER — LENALIDOMIDE 10 MG PO CAPS: 10.0000 mg | ORAL_CAPSULE | Freq: Every day | ORAL | 0 refills | Status: DC

## 2022-06-01 ENCOUNTER — Other Ambulatory Visit: Payer: Self-pay | Admitting: Internal Medicine

## 2022-06-01 DIAGNOSIS — C9000 Multiple myeloma not having achieved remission: Secondary | ICD-10-CM

## 2022-06-02 ENCOUNTER — Encounter: Payer: Self-pay | Admitting: Internal Medicine

## 2022-06-05 MED FILL — Iron Sucrose Inj 20 MG/ML (Fe Equiv): INTRAVENOUS | Qty: 10 | Status: AC

## 2022-06-06 ENCOUNTER — Inpatient Hospital Stay (HOSPITAL_BASED_OUTPATIENT_CLINIC_OR_DEPARTMENT_OTHER): Payer: Medicare Other | Admitting: Internal Medicine

## 2022-06-06 ENCOUNTER — Inpatient Hospital Stay: Payer: Medicare Other

## 2022-06-06 ENCOUNTER — Encounter: Payer: Self-pay | Admitting: Internal Medicine

## 2022-06-06 ENCOUNTER — Inpatient Hospital Stay: Payer: Medicare Other | Attending: Nurse Practitioner

## 2022-06-06 VITALS — BP 162/63 | HR 66 | Temp 98.2°F | Resp 15 | Wt 132.0 lb

## 2022-06-06 DIAGNOSIS — Z5112 Encounter for antineoplastic immunotherapy: Secondary | ICD-10-CM | POA: Insufficient documentation

## 2022-06-06 DIAGNOSIS — C9000 Multiple myeloma not having achieved remission: Secondary | ICD-10-CM

## 2022-06-06 DIAGNOSIS — I10 Essential (primary) hypertension: Secondary | ICD-10-CM | POA: Diagnosis not present

## 2022-06-06 DIAGNOSIS — E119 Type 2 diabetes mellitus without complications: Secondary | ICD-10-CM | POA: Diagnosis not present

## 2022-06-06 DIAGNOSIS — D649 Anemia, unspecified: Secondary | ICD-10-CM | POA: Diagnosis not present

## 2022-06-06 LAB — CMP (CANCER CENTER ONLY)
ALT: 15 U/L (ref 0–44)
AST: 24 U/L (ref 15–41)
Albumin: 3.7 g/dL (ref 3.5–5.0)
Alkaline Phosphatase: 52 U/L (ref 38–126)
Anion gap: 5 (ref 5–15)
BUN: 18 mg/dL (ref 8–23)
CO2: 24 mmol/L (ref 22–32)
Calcium: 8.9 mg/dL (ref 8.9–10.3)
Chloride: 105 mmol/L (ref 98–111)
Creatinine: 0.91 mg/dL (ref 0.44–1.00)
GFR, Estimated: >60 mL/min (ref >60)
Glucose, Bld: 162 mg/dL — ABNORMAL HIGH (ref 70–99)
Potassium: 3.5 mmol/L (ref 3.5–5.1)
Sodium: 134 mmol/L — ABNORMAL LOW (ref 135–145)
Total Bilirubin: 0.5 mg/dL (ref 0.3–1.2)
Total Protein: 6.1 g/dL — ABNORMAL LOW (ref 6.5–8.1)

## 2022-06-06 LAB — CBC WITH DIFFERENTIAL/PLATELET
Abs Immature Granulocytes: 0.03 10*3/uL (ref 0.00–0.07)
Basophils Absolute: 0 10*3/uL (ref 0.0–0.1)
Basophils Relative: 1 %
Eosinophils Absolute: 0 10*3/uL (ref 0.0–0.5)
Eosinophils Relative: 1 %
HCT: 29.8 % — ABNORMAL LOW (ref 36.0–46.0)
Hemoglobin: 9.4 g/dL — ABNORMAL LOW (ref 12.0–15.0)
Immature Granulocytes: 1 %
Lymphocytes Relative: 30 %
Lymphs Abs: 1.5 10*3/uL (ref 0.7–4.0)
MCH: 32.6 pg (ref 26.0–34.0)
MCHC: 31.5 g/dL (ref 30.0–36.0)
MCV: 103.5 fL — ABNORMAL HIGH (ref 80.0–100.0)
Monocytes Absolute: 0.5 10*3/uL (ref 0.1–1.0)
Monocytes Relative: 11 %
Neutro Abs: 2.8 10*3/uL (ref 1.7–7.7)
Neutrophils Relative %: 56 %
Platelets: 281 10*3/uL (ref 150–400)
RBC: 2.88 MIL/uL — ABNORMAL LOW (ref 3.87–5.11)
RDW: 16.6 % — ABNORMAL HIGH (ref 11.5–15.5)
WBC: 4.9 10*3/uL (ref 4.0–10.5)
nRBC: 0 % (ref 0.0–0.2)

## 2022-06-06 MED ORDER — DIPHENHYDRAMINE HCL 25 MG PO CAPS
50.0000 mg | ORAL_CAPSULE | Freq: Once | ORAL | Status: AC
  Administered 2022-06-06: 50 mg via ORAL
  Filled 2022-06-06: qty 2

## 2022-06-06 MED ORDER — ZOLEDRONIC ACID 4 MG/5ML IV CONC
3.0000 mg | Freq: Once | INTRAVENOUS | Status: AC
  Administered 2022-06-06: 3 mg via INTRAVENOUS
  Filled 2022-06-06: qty 3.75

## 2022-06-06 MED ORDER — ACETAMINOPHEN 325 MG PO TABS
650.0000 mg | ORAL_TABLET | Freq: Once | ORAL | Status: AC
  Administered 2022-06-06: 650 mg via ORAL
  Filled 2022-06-06: qty 2

## 2022-06-06 MED ORDER — DEXAMETHASONE 4 MG PO TABS
4.0000 mg | ORAL_TABLET | Freq: Once | ORAL | Status: DC
  Filled 2022-06-06: qty 1

## 2022-06-06 MED ORDER — SODIUM CHLORIDE 0.9 % IV SOLN
Freq: Once | INTRAVENOUS | Status: AC
  Filled 2022-06-06: qty 250

## 2022-06-06 MED ORDER — DARATUMUMAB-HYALURONIDASE-FIHJ 1800-30000 MG-UT/15ML ~~LOC~~ SOLN
1800.0000 mg | Freq: Once | SUBCUTANEOUS | Status: AC
  Administered 2022-06-06: 1800 mg via SUBCUTANEOUS
  Filled 2022-06-06: qty 15

## 2022-06-06 NOTE — Progress Notes (Signed)
[place] NOTE  Patient Care Team: Juline Patch, MD as PCP - General (Family Medicine) Vladimir Faster, [facility] (Inactive) (Pharmacist) Cammie Sickle, MD as Consulting Physician (Oncology)  CHIEF COMPLAINTS/PURPOSE OF CONSULTATION: Multiple myeloma  Oncology History Overview Note  # MULTIPLE MYELOMA  [FEB 2023-hypercalcemia status changes]-Active [bone lesions; hypercalcemia; anemia; No renal insuffiencey;Bone marrow-32% plasma cell-   STANDARD Cytogenetics].FEB 2023- S/p dexamethasone 20 mg q day x4.   # REV [15 mg -3 week-On and 1 week-OFF]-DARA; May 2023-discontinued rev 15 diarrhea/hypotension  # MAY 2023- dara-Rev 10 mg 3w/1w   # FEB 2023-hypercalcemia [[facility]-status post calcitonin; bisphosphonate]  BONE MARROW, ASPIRATE, CLOT, CORE:  -Hypercellular bone marrow with plasma cell neoplasm  -See comment   PERIPHERAL BLOOD:  -Normocytic-normochromic anemia   COMMENT:   The bone marrow is hypercellular for age with increased number of  atypical plasma cells representing 32% of all cells in the aspirate  associated with interstitial infiltrates and numerous variably sized  clusters in the clot/biopsy sections.  The plasma cells display weak  kappa light chain restriction consistent with plasma cell neoplasm.  Correlation with cytogenetic and FISH studies is recommended   MICROSCOPIC DESCRIPTION:   PERIPHERAL BLOOD SMEAR: The red blood cells display mild  anisopoikilocytosis with mild polychromasia.  The white blood cells are  normal number with scattered hypogranular neutrophils. An occasional  myelocyte and large atypical mononuclear cells are seen on scan. The  platelets are normal in number.    Multiple myeloma not having achieved remission ([place])  05/15/2021 Initial Diagnosis   Multiple myeloma not having achieved remission ([place])   06/27/2021 - 10/17/2021 Chemotherapy   Patient is on Treatment Plan : MYELOMA Daratumumab SQ + Lenalidomide +  Dexamethasone (DaraRd) q28d     06/27/2021 -  Chemotherapy   Patient is on Treatment Plan : MYELOMA RELAPSED REFRACTORY Daratumumab SQ + Lenalidomide + Dexamethasone (DaraRd) q28d     07/11/2021 Cancer Staging   Staging form: Plasma Cell Myeloma and Plasma Cell Disorders, AJCC 8th Edition - Clinical: Beta-2-microglobulin (mg/L): 2.6, Albumin (g/dL): 3.9, ISS: Stage I - Signed by Cammie Sickle, MD on 07/11/2021 Stage prefix: Initial diagnosis Beta 2 microglobulin range (mg/L): Less than 3.5 Albumin range (g/dL): Greater than or equal to 3.5     HISTORY OF PRESENTING ILLNESS: pt in a wheel chair; accompanied by her daughter.   Cristina Morgan 88 y.o.  female with  multiple myeloma currently on Rev-Dex is proceed with Dara SQ today.    Patient will follow-up with nutrition today. She reports doing well.  Has gained 5 pounds.  Denies any diarrhea, nausea, vomiting. Tolerating treatment well.  Taking her dexamethasone and Revlimid.  Reflux is better after the addition of pantoprazole.   Review of Systems  Constitutional:  Positive for malaise/fatigue and weight loss. Negative for chills, diaphoresis and fever.  HENT:  Negative for nosebleeds and sore throat.   Eyes:  Negative for double vision.  Respiratory:  Negative for cough, hemoptysis, sputum production, shortness of breath and wheezing.   Cardiovascular:  Negative for chest pain, palpitations, orthopnea and leg swelling.  Gastrointestinal:  Negative for abdominal pain, blood in stool, constipation, diarrhea, heartburn, melena, nausea and vomiting.  Genitourinary:  Negative for dysuria, frequency and urgency.  Musculoskeletal:  Positive for back pain and joint pain.  Skin: Negative.  Negative for itching and rash.  Neurological:  Negative for dizziness, tingling, focal weakness, weakness and headaches.  Endo/Heme/Allergies:  Does not bruise/bleed easily.  Psychiatric/Behavioral:  Negative for depression. The patient is not  nervous/anxious and does not have insomnia.      MEDICAL HISTORY:  Past Medical History:  Diagnosis Date   Anemia    CAD (coronary artery disease)    Cataracts, bilateral    Closed compression fracture of first lumbar vertebra (HCC)    Closed compression fracture of second lumbar vertebra (HCC)    Diabetes mellitus without complication (HCC)    High cholesterol    History of pneumonia 2010   [place]   History of uterine fibroid    Hypercalcemia    Hyperlipidemia    Hypertension    Lytic bone lesions on xray    Multiple myeloma (HCC)    Osteoporosis    Primary osteoarthritis of right knee     SURGICAL HISTORY: Past Surgical History:  Procedure Laterality Date   CATARACT EXTRACTION Bilateral 2014   COLONOSCOPY  2011   cleared for 5 yrs- [facility]   ECTOPIC PREGNANCY SURGERY      SOCIAL HISTORY: Social History   Socioeconomic History   Marital status: Married    Spouse name: Not on file   Number of children: 2   Years of education: Not on file   Highest education level: 12th grade  Occupational History   Occupation: Retired  Tobacco Use   Smoking status: Never    Passive exposure: Never   Smokeless tobacco: Never   Tobacco comments:    smoking cessation materials not required  Vaping Use   Vaping Use: Never used  Substance and Sexual Activity   Alcohol use: No   Drug use: No   Sexual activity: Not Currently  Other Topics Concern   Not on file  Social History Narrative   Union with husband; drives. Never smoked; no alcohol. Daughters live close.    Social Determinants of Health   Financial Resource Strain: Low Risk  (11/13/2021)   Overall Financial Resource Strain (CARDIA)    Difficulty of Paying Living Expenses: Not hard at all  Food Insecurity: No Food Insecurity (11/13/2021)   Hunger Vital Sign    Worried About Running Out of Food in the Last Year: Never true    Ran Out of Food in the Last Year: Never true  Transportation Needs: No  Transportation Needs (11/13/2021)   PRAPARE - Hydrologist (Medical): No    Lack of Transportation (Non-Medical): No  Physical Activity: Sufficiently Active (11/13/2021)   Exercise Vital Sign    Days of Exercise per Week: 7 days    Minutes of Exercise per Session: 30 min  Stress: No Stress Concern Present (11/13/2021)   [place]    Feeling of Stress : Not at all  Social Connections: Moderately Integrated (11/13/2021)   Social Connection and Isolation Panel [NHANES]    Frequency of Communication with Friends and Family: Twice a week    Frequency of Social Gatherings with Friends and Family: More than three times a week    Attends Religious Services: 1 to 4 times per year    Active Member of [employer] or Organizations: No    Attends Archivist Meetings: Never    Marital Status: Married  Human resources officer Violence: Not At Risk (11/13/2021)   Humiliation, Afraid, Rape, and Kick questionnaire    Fear of Current or Ex-Partner: No    Emotionally Abused: No    Physically Abused: No    Sexually Abused: No  FAMILY HISTORY: Family History  Problem Relation Age of Onset   Cancer Mother        breast   Heart disease Mother    Diabetes Father    Heart disease Father    Heart attack Father    Heart attack Brother    Diabetes Brother     ALLERGIES:  is allergic to latex and penicillins.  MEDICATIONS:  Current Outpatient Medications  Medication Sig Dispense Refill   ACCU-CHEK GUIDE test strip USE 1 STRIP TO CHECK GLUCOSE ONCE DAILY AS DIRECTED 100 each 0   Accu-Chek Softclix Lancets lancets USE 1  TO CHECK GLUCOSE ONCE DAILY 100 each 0   acetaminophen (TYLENOL) 325 MG tablet Take 325 mg by mouth 2 (two) times daily. START 2 days prior to chemo infusion. Take For 2 days; Do NOT take on the day of infusion     BD PEN NEEDLE NANO 2ND GEN 32G X 4 MM MISC USE 1  ONCE DAILY 100 each 0   Blood  Glucose Monitoring Suppl (ACCU-CHEK GUIDE) w/Device KIT USE AS DIRECTED TO CHECK GLUCOSE     acyclovir (ZOVIRAX) 400 MG tablet Take 1 tablet (400 mg total) by mouth 2 (two) times daily. 60 tablet 4   Calcium Carbonate-Vit D-Min (CALCIUM 1200 PO) Take 1 capsule by mouth daily.     carvedilol (COREG) 3.125 MG tablet TAKE ONE TABLET BY MOUTH TWICE DAILY 60 tablet 1   cholecalciferol (VITAMIN D3) 25 MCG (1000 UNIT) tablet Take 1,000 Units by mouth daily.     dexamethasone (DECADRON) 4 MG tablet Take 1 tablet (4 mg total) by mouth 2 (two) times daily. Start 2 days prior to infusion; Take it for 2 days. 60 tablet 3   dexamethasone (DECADRON) 4 MG tablet Take 5 tablets (20 mg total) by mouth as directed. Take one hour before monthly Darzalex injections. 15 tablet 3   diphenoxylate-atropine (LOMOTIL) 2.5-0.025 MG tablet Take 1 tablet by mouth 4 (four) times daily as needed for diarrhea or loose stools. Take it along with immodium 60 tablet 3   Glucerna (GLUCERNA) LIQD Take 237 mLs by mouth.     hydrOXYzine (ATARAX) 10 MG tablet Take 1 tablet (10 mg total) by mouth at bedtime as needed. 90 tablet 1   Infant Foods (GOOD START 2 ESSENTIALS/IRON PO) Take 1 tablet by mouth daily.     insulin glargine (LANTUS SOLOSTAR) 100 UNIT/ML Solostar Pen Inject 10 Units into the skin daily. 6 units every day except Wed and Thursday- 8 units 15 mL 0   lenalidomide (REVLIMID) 10 MG capsule Take 1 capsule (10 mg total) by mouth daily. Take for 21 days, then hold for 7 days. Repeat every 28 days. 21 capsule 0   losartan (COZAAR) 25 MG tablet TAKE ONE TABLET BY MOUTH EVERY EVENING 90 tablet 1   lovastatin (MEVACOR) 40 MG tablet Take 1 tablet (40 mg total) by mouth every evening. 90 tablet 1   metFORMIN (GLUCOPHAGE) 1000 MG tablet Take 1 tablet (1,000 mg total) by mouth 2 (two) times daily. 180 tablet 1   montelukast (SINGULAIR) 10 MG tablet TAKE ONE TABLET ONCE DAILY, starting TWO DAYS prior TO infusion. take FOR TWO DAYS AND DO  not take ON DAY of infusion 20 tablet 1   ondansetron (ZOFRAN) 8 MG tablet One pill every 8 hours as needed for nausea/vomitting. 40 tablet 1   pantoprazole (PROTONIX) 40 MG tablet Take 1 tablet (40 mg total) by mouth daily. 90 tablet 0  prochlorperazine (COMPAZINE) 10 MG tablet Take 1 tablet (10 mg total) by mouth every 6 (six) hours as needed for nausea or vomiting. 40 tablet 1   sertraline (ZOLOFT) 25 MG tablet TAKE ONE TABLET BY MOUTH ONCE DAILY 90 tablet 1   triamcinolone (NASACORT) 55 MCG/ACT AERO nasal inhaler 2 sprays 2 (two) times daily.     No current facility-administered medications for this visit.      Marland Kitchen  PHYSICAL EXAMINATION:  There were no vitals filed for this visit.   There were no vitals filed for this visit.    Physical Exam Vitals and nursing note reviewed.  HENT:     Head: Normocephalic and atraumatic.     Mouth/Throat:     Pharynx: Oropharynx is clear.  Eyes:     Extraocular Movements: Extraocular movements intact.     Pupils: Pupils are equal, round, and reactive to light.  Cardiovascular:     Rate and Rhythm: Normal rate and regular rhythm.  Pulmonary:     Comments: Decreased breath sounds bilaterally.  Abdominal:     Palpations: Abdomen is soft.  Musculoskeletal:        General: Normal range of motion.     Cervical back: Normal range of motion.  Skin:    General: Skin is warm.  Neurological:     General: No focal deficit present.     Mental Status: She is alert and oriented to person, place, and time.  Psychiatric:        Behavior: Behavior normal.        Judgment: Judgment normal.     LABORATORY DATA:  I have reviewed the data as listed Lab Results  Component Value Date   WBC 4.9 06/06/2022   HGB 9.4 (L) 06/06/2022   HCT 29.8 (L) 06/06/2022   MCV 103.5 (H) 06/06/2022   PLT 281 06/06/2022   Recent Labs    03/14/22 1102 04/11/22 0836 05/09/22 0849  NA 139 135 135  K 3.6 3.8 3.9  CL 107 102 101  CO2 25 23 24   GLUCOSE 105*  150* 164*  BUN 20 26* 19  CREATININE 1.18* 1.01* 1.00  CALCIUM 9.4 10.4* 9.1  GFRNONAA 45* 54* 55*  PROT 6.3* 7.6 6.2*  ALBUMIN 3.4* 4.5 3.7  AST 26 25 24   ALT 22 18 18   ALKPHOS 55 85 59  BILITOT 0.4 0.4 0.6     RADIOGRAPHIC STUDIES: I have personally reviewed the radiological images as listed and agreed with the findings in the report. No results found.  Multiple myeloma not having achieved remission ([place]) # STAGE I- MULTIPLE MYELOMA -Active [bone lesions; hypercalcemia; anemia; No renal insuffiencey;Bone marrow-32% plasma cell-   STANDARD Cytogenetics].  Currently on Revlimid-Dex- Dara SQ. Partial response noted;   From February 2024, M spike not observed.  Kappa 13.6, lambda 10.4 with normal ratio   # Proceed with Dara SQ today + DEX 20 mg PO [at home/pre-meds.]; Labs today reviewed;  acceptable for treatment today. On Revlimid at 10 mg dose 3 weeks on 1 week off; continue weekly -Dex 4 mg weekly [reflux like sypmtoms- see below].   Stable.    #Reflux-like symptoms-  Secondary to dexamethasone;  continue low-dose 4 mg weekly/WED.  and wants to hold off GI evaluation [per daughter]-resolved after addition of pantoprazole.   # Anemia- Hb 8.19 Feb 2022- Iron sat-11 Ferritin- 26; continue trial of gentle iron. Hb [JAn 2024]-9.4.  Stable from last month.  Iron studies reviewed from February 2024.  I will hold off on IV Venofer.   # Diarrhea- on Revlimid 10 mg a day- 3 weeks-ON & 1 week OFF.  Resolved   # Diabetes-on oral hypoglycemic agents; on long acting insulin; 14 units of lantus on T/W/Thursday- Defer to PCP. PBF- BG-164table   # HTN: continue losartan; continue coreg; BP- 150-160- refilled coreg; stable   # Hypercalcemia secondary to multiple myeloma- [FEB 2023-Zometa ; calcitonin]-calcium today is 8.9.  Proceed with Zometa today.    # Weight loss-s/p re-veluation with Joli - stable   #DVT/shingles prophylaxis: Aspirin/acyclovir.stable   # IV access:PIV      #  DISPOSITION:  # RTC in 4 weeks for MD visit with Dr. Jacinto Reap, labs, subcu Dara, possible Venofer.  Orders Placed This Encounter  Procedures   CBC with Differential   Comprehensive metabolic panel   Multiple Myeloma Panel (SPEP&IFE w/QIG)   Kappa/lambda light chains    All questions were answered. The patient knows to call the clinic with any problems, questions or concerns.       Jane Canary, MD 06/06/2022 1:16 PM

## 2022-06-06 NOTE — Progress Notes (Signed)
Nutrition Follow-up:  Patient with multiple myeloma, currently on darzalex.  Met with patient during infusion. Reports that she has been eating, snacking more.  "Did you see my weight."  Says that she has been eating peanut butter crackers, nuts, potato sticks, eggs, cereal, chicken, vegetables and fruits.  Drinking glucerna shakes.  Says that her stomach is better, no diarrhea.      Medications: reviewed  Labs: reviewed  Anthropometrics:   Weight 132 lb today  128 lb on 2/23 130 lb 12.8 oz on 1/26 125 lb on 1/11 135 lb on 12/1 (swelling) 124 lb on 10/27 127 lb on 9/28   NUTRITION DIAGNOSIS: Inadequate oral intake improving    INTERVENTION:  Coupons given for glucerna shakes Continue high calorie, high protein foods for weight maintenance    MONITORING, EVALUATION, GOAL: weight trends, intake   NEXT VISIT: Friday, April 19 during infusion  Merril Nagy B. [name], [place], [place] Registered Dietitian [phone]

## 2022-06-09 LAB — KAPPA/LAMBDA LIGHT CHAINS
Kappa free light chain: 10.9 mg/L (ref 3.3–19.4)
Kappa, lambda light chain ratio: 1.49 (ref 0.26–1.65)
Lambda free light chains: 7.3 mg/L (ref 5.7–26.3)

## 2022-06-11 LAB — MULTIPLE MYELOMA PANEL, SERUM
Albumin SerPl Elph-Mcnc: 3.5 g/dL (ref 2.9–4.4)
Albumin/Glob SerPl: 1.6 (ref 0.7–1.7)
Alpha 1: 0.2 g/dL (ref 0.0–0.4)
Alpha2 Glob SerPl Elph-Mcnc: 0.7 g/dL (ref 0.4–1.0)
B-Globulin SerPl Elph-Mcnc: 0.8 g/dL (ref 0.7–1.3)
Gamma Glob SerPl Elph-Mcnc: 0.5 g/dL (ref 0.4–1.8)
Globulin, Total: 2.2 g/dL (ref 2.2–3.9)
IgA: 60 mg/dL — ABNORMAL LOW (ref 64–422)
IgG (Immunoglobin G), Serum: 557 mg/dL — ABNORMAL LOW (ref 586–1602)
IgM (Immunoglobulin M), Srm: 21 mg/dL — ABNORMAL LOW (ref 26–217)
M Protein SerPl Elph-Mcnc: 0.1 g/dL — ABNORMAL HIGH
Total Protein ELP: 5.7 g/dL — ABNORMAL LOW (ref 6.0–8.5)

## 2022-06-18 ENCOUNTER — Encounter: Payer: Self-pay | Admitting: Internal Medicine

## 2022-06-19 ENCOUNTER — Ambulatory Visit: Payer: Medicare Other | Admitting: Gastroenterology

## 2022-07-03 MED FILL — Iron Sucrose Inj 20 MG/ML (Fe Equiv): INTRAVENOUS | Qty: 10 | Status: AC

## 2022-07-04 ENCOUNTER — Inpatient Hospital Stay: Payer: Medicare Other | Attending: Nurse Practitioner

## 2022-07-04 ENCOUNTER — Inpatient Hospital Stay: Payer: Medicare Other

## 2022-07-04 ENCOUNTER — Encounter: Payer: Self-pay | Admitting: Internal Medicine

## 2022-07-04 ENCOUNTER — Inpatient Hospital Stay (HOSPITAL_BASED_OUTPATIENT_CLINIC_OR_DEPARTMENT_OTHER): Payer: Medicare Other | Admitting: Internal Medicine

## 2022-07-04 VITALS — BP 162/59 | HR 61 | Temp 98.6°F | Resp 20 | Wt 131.5 lb

## 2022-07-04 VITALS — BP 152/61 | HR 70

## 2022-07-04 DIAGNOSIS — Z794 Long term (current) use of insulin: Secondary | ICD-10-CM | POA: Insufficient documentation

## 2022-07-04 DIAGNOSIS — D649 Anemia, unspecified: Secondary | ICD-10-CM | POA: Insufficient documentation

## 2022-07-04 DIAGNOSIS — R5383 Other fatigue: Secondary | ICD-10-CM | POA: Insufficient documentation

## 2022-07-04 DIAGNOSIS — I10 Essential (primary) hypertension: Secondary | ICD-10-CM | POA: Insufficient documentation

## 2022-07-04 DIAGNOSIS — R197 Diarrhea, unspecified: Secondary | ICD-10-CM | POA: Diagnosis not present

## 2022-07-04 DIAGNOSIS — C9000 Multiple myeloma not having achieved remission: Secondary | ICD-10-CM

## 2022-07-04 DIAGNOSIS — E119 Type 2 diabetes mellitus without complications: Secondary | ICD-10-CM | POA: Insufficient documentation

## 2022-07-04 DIAGNOSIS — Z5112 Encounter for antineoplastic immunotherapy: Secondary | ICD-10-CM | POA: Diagnosis not present

## 2022-07-04 LAB — COMPREHENSIVE METABOLIC PANEL
ALT: 19 U/L (ref 0–44)
AST: 23 U/L (ref 15–41)
Albumin: 3.7 g/dL (ref 3.5–5.0)
Alkaline Phosphatase: 56 U/L (ref 38–126)
Anion gap: 6 (ref 5–15)
BUN: 19 mg/dL (ref 8–23)
CO2: 23 mmol/L (ref 22–32)
Calcium: 9 mg/dL (ref 8.9–10.3)
Chloride: 105 mmol/L (ref 98–111)
Creatinine, Ser: 1.14 mg/dL — ABNORMAL HIGH (ref 0.44–1.00)
GFR, Estimated: 47 mL/min — ABNORMAL LOW (ref >60)
Glucose, Bld: 149 mg/dL — ABNORMAL HIGH (ref 70–99)
Potassium: 3.9 mmol/L (ref 3.5–5.1)
Sodium: 134 mmol/L — ABNORMAL LOW (ref 135–145)
Total Bilirubin: 0.5 mg/dL (ref 0.3–1.2)
Total Protein: 6.2 g/dL — ABNORMAL LOW (ref 6.5–8.1)

## 2022-07-04 LAB — CBC WITH DIFFERENTIAL/PLATELET
Abs Immature Granulocytes: 0.01 10*3/uL (ref 0.00–0.07)
Basophils Absolute: 0.1 10*3/uL (ref 0.0–0.1)
Basophils Relative: 1 %
Eosinophils Absolute: 0 10*3/uL (ref 0.0–0.5)
Eosinophils Relative: 0 %
HCT: 28.7 % — ABNORMAL LOW (ref 36.0–46.0)
Hemoglobin: 9.1 g/dL — ABNORMAL LOW (ref 12.0–15.0)
Immature Granulocytes: 0 %
Lymphocytes Relative: 29 %
Lymphs Abs: 1.2 10*3/uL (ref 0.7–4.0)
MCH: 32.3 pg (ref 26.0–34.0)
MCHC: 31.7 g/dL (ref 30.0–36.0)
MCV: 101.8 fL — ABNORMAL HIGH (ref 80.0–100.0)
Monocytes Absolute: 0.6 10*3/uL (ref 0.1–1.0)
Monocytes Relative: 14 %
Neutro Abs: 2.3 10*3/uL (ref 1.7–7.7)
Neutrophils Relative %: 56 %
Platelets: 382 10*3/uL (ref 150–400)
RBC: 2.82 MIL/uL — ABNORMAL LOW (ref 3.87–5.11)
RDW: 15.3 % (ref 11.5–15.5)
WBC: 4.1 10*3/uL (ref 4.0–10.5)
nRBC: 0 % (ref 0.0–0.2)

## 2022-07-04 MED ORDER — DEXAMETHASONE 4 MG PO TABS: 4.0000 mg | ORAL_TABLET | Freq: Once | ORAL | Status: DC

## 2022-07-04 MED ORDER — FUROSEMIDE 20 MG PO TABS: 20.0000 mg | ORAL_TABLET | Freq: Every day | ORAL | 0 refills | Status: DC

## 2022-07-04 MED ORDER — DIPHENHYDRAMINE HCL 25 MG PO CAPS: 50.0000 mg | ORAL_CAPSULE | Freq: Once | ORAL | Status: DC

## 2022-07-04 MED ORDER — ACETAMINOPHEN 325 MG PO TABS: 650.0000 mg | ORAL_TABLET | Freq: Once | ORAL | Status: DC

## 2022-07-04 MED ORDER — SODIUM CHLORIDE 0.9 % IV SOLN
Freq: Once | INTRAVENOUS | Status: AC
  Filled 2022-07-04: qty 250

## 2022-07-04 MED ORDER — DARATUMUMAB-HYALURONIDASE-FIHJ 1800-30000 MG-UT/15ML ~~LOC~~ SOLN
1800.0000 mg | Freq: Once | SUBCUTANEOUS | Status: AC
  Administered 2022-07-04: 1800 mg via SUBCUTANEOUS
  Filled 2022-07-04: qty 15

## 2022-07-04 MED ORDER — SODIUM CHLORIDE 0.9 % IV SOLN
200.0000 mg | Freq: Once | INTRAVENOUS | Status: AC
  Administered 2022-07-04: 200 mg via INTRAVENOUS
  Filled 2022-07-04: qty 200

## 2022-07-04 NOTE — Patient Instructions (Signed)
[facility]  Discharge Instructions: Thank you for choosing [facility] to provide your oncology and hematology care.  If you have a lab appointment with the Cancer Center, please go directly to the Cancer Center and check in at the registration area.  Wear comfortable clothing and clothing appropriate for easy access to any Portacath or PICC line.   We strive to give you quality time with your provider. You may need to reschedule your appointment if you arrive late (15 or more minutes).  Arriving late affects you and other patients whose appointments are after yours.  Also, if you miss three or more appointments without notifying the office, you may be dismissed from the clinic at the provider's discretion.      For prescription refill requests, have your pharmacy contact our office and allow 72 hours for refills to be completed.    Today you received the following chemotherapy and/or immunotherapy agents darzalex    To help prevent nausea and vomiting after your treatment, we encourage you to take your nausea medication as directed.  BELOW ARE SYMPTOMS THAT SHOULD BE REPORTED IMMEDIATELY: *FEVER GREATER THAN 100.4 F (38 C) OR HIGHER *CHILLS OR SWEATING *NAUSEA AND VOMITING THAT IS NOT CONTROLLED WITH YOUR NAUSEA MEDICATION *UNUSUAL SHORTNESS OF BREATH *UNUSUAL BRUISING OR BLEEDING *URINARY PROBLEMS (pain or burning when urinating, or frequent urination) *BOWEL PROBLEMS (unusual diarrhea, constipation, pain near the anus) TENDERNESS IN MOUTH AND THROAT WITH OR WITHOUT PRESENCE OF ULCERS (sore throat, sores in mouth, or a toothache) UNUSUAL RASH, SWELLING OR PAIN  UNUSUAL VAGINAL DISCHARGE OR ITCHING   Items with * indicate a potential emergency and should be followed up as soon as possible or go to the Emergency Department if any problems should occur.  Please show the CHEMOTHERAPY ALERT CARD or IMMUNOTHERAPY ALERT CARD at check-in to  the Emergency Department and triage nurse.  Should you have questions after your visit or need to cancel or reschedule your appointment, please contact [facility]  [phone] and follow the prompts.  Office hours are 8:00 a.m. to 4:30 p.m. Monday - Friday. Please note that voicemails left after 4:00 p.m. may not be returned until the following business day.  We are closed weekends and major holidays. You have access to a nurse at all times for urgent questions. Please call the main number to the clinic [phone] and follow the prompts.  For any non-urgent questions, you may also contact your provider using MyChart. We now offer e-Visits for anyone 81 and older to request care online for non-urgent symptoms. For details visit [email or web address].   Also download the MyChart app! Go to the app store, search "MyChart", open the app, select [facility], and log in with your MyChart username and password.

## 2022-07-04 NOTE — Progress Notes (Signed)
Patient has no concerns 

## 2022-07-04 NOTE — Assessment & Plan Note (Addendum)
#   STAGE I- MULTIPLE MYELOMA -Active [bone lesions; hypercalcemia; anemia; No renal insuffiencey;Bone marrow-32% plasma cell-   STANDARD Cytogenetics].  Currently on Revlimid-Dex- Dara SQ. Partial response noted;  JAN 2023-M protein = NEG;  K/L RATIO= 1. 7 = WNL;  stable.   # Proceed with Dara SQ today + DEX 20 mg PO [at home/pre-meds.]; Labs today reviewed;  acceptable for treatment today. On Revlimid at 10 mg dose 3 weeks on 1 week off; continue weekly -Dex 4 mg weekly [reflux like sypmtoms- see below].   Stable.   #Reflux-like symptoms-  Secondary to dexamethasone; on PPI- continue low-dose 4 mg weekly/WED.  and wants to hold off GI evaluation [per daughter]- improved. stable  # Anemia-moderate- multifactorial-MM/ IDA-  FEB 2024- Iron sat- 13 Ferritin- 192; continue trial of gentle iron. Hb [JAn 2024]- 9-10  stable. Proceed with venofer today.  # Mild swelling in leg/ likely dependant/compression- no concerns of DVT- trial of lasix 20 mg/day.    # Diarrhea- improved on Revlimid 10 mg a day- 3 weeks-ON & 1 week OFF. Stable; refilled lomotil- Stable.  # Diabetes-on oral hypoglycemic agents; on long acting insulin; 14 units of lantus on T/W/Thursday- Defer to PCP. PBF- BG-149- stable  # HTN: continue losartan; continue coreg; BP- 150-160- refilled coreg; stable  # Hypercalcemia secondary to multiple myeloma- [FEB 2023-Zometa ; calcitonin]-calcium today is 9.6. Zometa q 68M; stable  # Weight loss-s/p re-veluation with Joli - stable  #DVT/shingles prophylaxis: Aspirin/acyclovir.stable  # Vaccinations; Ok with flu/COVID shot  # IV access:PIV   [facility]- [phone]  zometa-q 3 months [next june 2024]- ; MM; K/L q 4 W  # DISPOSITION:  # proceed with  dara SQ; venofer-   # follow up in 4 weeks/Friday  MD-labs- cbc/cmp MM panel;K/L light chains;  Dara SQ; possible venofer; Dr.B

## 2022-07-04 NOTE — Progress Notes (Signed)
[facility] CONSULT NOTE  Patient Care Team: Duanne Limerick, MD as PCP - General (Family Medicine) Lajean Manes, [employer] (Inactive) (Pharmacist) Earna Coder, MD as Consulting Physician (Oncology)  CHIEF COMPLAINTS/PURPOSE OF CONSULTATION: Multiple myeloma  Oncology History Overview Note  # MULTIPLE MYELOMA  [FEB 2023-hypercalcemia status changes]-Active [bone lesions; hypercalcemia; anemia; No renal insuffiencey;Bone marrow-32% plasma cell-   STANDARD Cytogenetics].FEB 2023- S/p dexamethasone 20 mg q day x4.   # REV [15 mg -3 week-On and 1 week-OFF]-DARA; May 2023-discontinued rev 15 diarrhea/hypotension  # MAY 2023- dara-Rev 10 mg 3w/1w   # FEB 2023-hypercalcemia [[facility]-status post calcitonin; bisphosphonate]  BONE MARROW, ASPIRATE, CLOT, CORE:  -Hypercellular bone marrow with plasma cell neoplasm  -See comment   PERIPHERAL BLOOD:  -Normocytic-normochromic anemia   COMMENT:   The bone marrow is hypercellular for age with increased number of  atypical plasma cells representing 32% of all cells in the aspirate  associated with interstitial infiltrates and numerous variably sized  clusters in the clot/biopsy sections.  The plasma cells display weak  kappa light chain restriction consistent with plasma cell neoplasm.  Correlation with cytogenetic and FISH studies is recommended   MICROSCOPIC DESCRIPTION:   PERIPHERAL BLOOD SMEAR: The red blood cells display mild  anisopoikilocytosis with mild polychromasia.  The white blood cells are  normal number with scattered hypogranular neutrophils. An occasional  myelocyte and large atypical mononuclear cells are seen on scan. The  platelets are normal in number.    Multiple myeloma not having achieved remission  05/15/2021 Initial Diagnosis   Multiple myeloma not having achieved remission (HCC)   06/27/2021 - 10/17/2021 Chemotherapy   Patient is on Treatment Plan : MYELOMA Daratumumab SQ + Lenalidomide +  Dexamethasone (DaraRd) q28d     06/27/2021 -  Chemotherapy   Patient is on Treatment Plan : MYELOMA RELAPSED REFRACTORY Daratumumab SQ + Lenalidomide + Dexamethasone (DaraRd) q28d     07/11/2021 Cancer Staging   Staging form: Plasma Cell Myeloma and Plasma Cell Disorders, AJCC 8th Edition - Clinical: Beta-2-microglobulin (mg/L): 2.6, Albumin (g/dL): 3.9, ISS: Stage I - Signed by Earna Coder, MD on 07/11/2021 Stage prefix: Initial diagnosis Beta 2 microglobulin range (mg/L): Less than 3.5 Albumin range (g/dL): Greater than or equal to 3.5     HISTORY OF PRESENTING ILLNESS: pt in a wheel chair; accompanied by her daughter.   Cristina Morgan 88 y.o.  female with  multiple myeloma currently on Rev-Dex is proceed with Dara SQ today.    Complains of bilateral leg swelling. No falls.   Chronic mild fatigue.  Otherwise no worsening shortness of breath or cough. Diarrhea well controlled with last loose stool last week improved with Lomotil.   Denies any nausea vomiting.  Denies any blood in stools or black-colored stools.  Patient states her back pain is improved.  Denies any worsening joint pains.   Review of Systems  Constitutional:  Positive for malaise/fatigue and weight loss. Negative for chills, diaphoresis and fever.  HENT:  Negative for nosebleeds and sore throat.   Eyes:  Negative for double vision.  Respiratory:  Negative for cough, hemoptysis, sputum production, shortness of breath and wheezing.   Cardiovascular:  Negative for chest pain, palpitations, orthopnea and leg swelling.  Gastrointestinal:  Negative for abdominal pain, blood in stool, constipation, diarrhea, heartburn, melena, nausea and vomiting.  Genitourinary:  Negative for dysuria, frequency and urgency.  Musculoskeletal:  Positive for back pain and joint pain.  Skin: Negative.  Negative for  itching and rash.  Neurological:  Negative for dizziness, tingling, focal weakness, weakness and headaches.   Endo/Heme/Allergies:  Does not bruise/bleed easily.  Psychiatric/Behavioral:  Negative for depression. The patient is not nervous/anxious and does not have insomnia.      MEDICAL HISTORY:  Past Medical History:  Diagnosis Date   Anemia    CAD (coronary artery disease)    Cataracts, bilateral    Closed compression fracture of first lumbar vertebra    Closed compression fracture of second lumbar vertebra    Diabetes mellitus without complication    High cholesterol    History of pneumonia 2010   [place]   History of uterine fibroid    Hypercalcemia    Hyperlipidemia    Hypertension    Lytic bone lesions on xray    Multiple myeloma    Osteoporosis    Primary osteoarthritis of right knee     SURGICAL HISTORY: Past Surgical History:  Procedure Laterality Date   CATARACT EXTRACTION Bilateral 2014   COLONOSCOPY  2011   cleared for 5 yrs- [facility]   ECTOPIC PREGNANCY SURGERY      SOCIAL HISTORY: Social History   Socioeconomic History   Marital status: Married    Spouse name: Not on file   Number of children: 2   Years of education: Not on file   Highest education level: 12th grade  Occupational History   Occupation: Retired  Tobacco Use   Smoking status: Never    Passive exposure: Never   Smokeless tobacco: Never   Tobacco comments:    smoking cessation materials not required  Vaping Use   Vaping Use: Never used  Substance and Sexual Activity   Alcohol use: No   Drug use: No   Sexual activity: Not Currently  Other Topics Concern   Not on file  Social History Narrative   [place] with husband; drives. Never smoked; no alcohol. Daughters live close.    Social Determinants of Health   Financial Resource Strain: Low Risk  (11/13/2021)   Overall Financial Resource Strain (CARDIA)    Difficulty of Paying Living Expenses: Not hard at all  Food Insecurity: No Food Insecurity (11/13/2021)   Hunger Vital Sign    Worried About Running Out of Food in the Last  Year: Never true    Ran Out of Food in the Last Year: Never true  Transportation Needs: No Transportation Needs (11/13/2021)   PRAPARE - Administrator, Civil Service (Medical): No    Lack of Transportation (Non-Medical): No  Physical Activity: Sufficiently Active (11/13/2021)   Exercise Vital Sign    Days of Exercise per Week: 7 days    Minutes of Exercise per Session: 30 min  Stress: No Stress Concern Present (11/13/2021)   [employer] of Occupational Health - Occupational Stress Questionnaire    Feeling of Stress : Not at all  Social Connections: Moderately Integrated (11/13/2021)   Social Connection and Isolation Panel [NHANES]    Frequency of Communication with Friends and Family: Twice a week    Frequency of Social Gatherings with Friends and Family: More than three times a week    Attends Religious Services: 1 to 4 times per year    Active Member of [employer] or Organizations: No    Attends Banker Meetings: Never    Marital Status: Married  Catering manager Violence: Not At Risk (11/13/2021)   Humiliation, Afraid, Rape, and Kick questionnaire    Fear of Current or Ex-Partner: No  Emotionally Abused: No    Physically Abused: No    Sexually Abused: No    FAMILY HISTORY: Family History  Problem Relation Age of Onset   Cancer Mother        breast   Heart disease Mother    Diabetes Father    Heart disease Father    Heart attack Father    Heart attack Brother    Diabetes Brother     ALLERGIES:  is allergic to latex and penicillins.  MEDICATIONS:  Current Outpatient Medications  Medication Sig Dispense Refill   ACCU-CHEK GUIDE test strip USE 1 STRIP TO CHECK GLUCOSE ONCE DAILY AS DIRECTED 100 each 0   Accu-Chek Softclix Lancets lancets USE 1  TO CHECK GLUCOSE ONCE DAILY 100 each 0   acetaminophen (TYLENOL) 325 MG tablet Take 325 mg by mouth 2 (two) times daily. START 2 days prior to chemo infusion. Take For 2 days; Do NOT take on the day of  infusion     acyclovir (ZOVIRAX) 400 MG tablet Take 1 tablet (400 mg total) by mouth 2 (two) times daily. 60 tablet 4   aspirin EC 81 MG tablet Take 81 mg by mouth daily. Swallow whole.     BD PEN NEEDLE NANO 2ND GEN 32G X 4 MM MISC USE 1  ONCE DAILY 100 each 0   Blood Glucose Monitoring Suppl (ACCU-CHEK GUIDE) w/Device KIT USE AS DIRECTED TO CHECK GLUCOSE     Calcium Carbonate-Vit D-Min (CALCIUM 1200 PO) Take 1 capsule by mouth daily.     carvedilol (COREG) 3.125 MG tablet TAKE ONE TABLET BY MOUTH TWICE DAILY 60 tablet 1   cholecalciferol (VITAMIN D3) 25 MCG (1000 UNIT) tablet Take 1,000 Units by mouth daily.     dexamethasone (DECADRON) 4 MG tablet Take 1 tablet (4 mg total) by mouth 2 (two) times daily. Start 2 days prior to infusion; Take it for 2 days. 60 tablet 3   dexamethasone (DECADRON) 4 MG tablet Take 5 tablets (20 mg total) by mouth as directed. Take one hour before monthly Darzalex injections. 15 tablet 3   diphenoxylate-atropine (LOMOTIL) 2.5-0.025 MG tablet Take 1 tablet by mouth 4 (four) times daily as needed for diarrhea or loose stools. Take it along with immodium 60 tablet 3   furosemide (LASIX) 20 MG tablet Take 1 tablet (20 mg total) by mouth daily. Take it in AM. 7 tablet 0   Glucerna (GLUCERNA) LIQD Take 237 mLs by mouth.     hydrOXYzine (ATARAX) 10 MG tablet Take 1 tablet (10 mg total) by mouth at bedtime as needed. 90 tablet 1   Infant Foods (GOOD START 2 ESSENTIALS/IRON PO) Take 1 tablet by mouth daily.     insulin glargine (LANTUS SOLOSTAR) 100 UNIT/ML Solostar Pen Inject 10 Units into the skin daily. 6 units every day except Wed and Thursday- 8 units 15 mL 0   lenalidomide (REVLIMID) 10 MG capsule Take 1 capsule (10 mg total) by mouth daily. Take for 21 days, then hold for 7 days. Repeat every 28 days. 21 capsule 0   losartan (COZAAR) 25 MG tablet TAKE ONE TABLET BY MOUTH EVERY EVENING 90 tablet 1   lovastatin (MEVACOR) 40 MG tablet Take 1 tablet (40 mg total) by mouth  every evening. 90 tablet 1   metFORMIN (GLUCOPHAGE) 1000 MG tablet Take 1 tablet (1,000 mg total) by mouth 2 (two) times daily. 180 tablet 1   montelukast (SINGULAIR) 10 MG tablet TAKE ONE TABLET ONCE DAILY, starting TWO  DAYS prior TO infusion. take FOR TWO DAYS AND DO not take ON DAY of infusion 20 tablet 1   ondansetron (ZOFRAN) 8 MG tablet One pill every 8 hours as needed for nausea/vomitting. 40 tablet 1   pantoprazole (PROTONIX) 40 MG tablet Take 1 tablet (40 mg total) by mouth daily. 90 tablet 0   prochlorperazine (COMPAZINE) 10 MG tablet Take 1 tablet (10 mg total) by mouth every 6 (six) hours as needed for nausea or vomiting. 40 tablet 1   sertraline (ZOLOFT) 25 MG tablet TAKE ONE TABLET BY MOUTH ONCE DAILY 90 tablet 1   triamcinolone (NASACORT) 55 MCG/ACT AERO nasal inhaler 2 sprays 2 (two) times daily.     No current facility-administered medications for this visit.   Facility-Administered Medications Ordered in Other Visits  Medication Dose Route Frequency Provider Last Rate Last Admin   0.9 %  sodium chloride infusion   Intravenous Once Earna Coder, MD       acetaminophen (TYLENOL) tablet 650 mg  650 mg Oral Once Michaelyn Barter, MD       daratumumab-hyaluronidase-[facility] FASPRO) 1800-30000 MG-UT/15ML chemo SQ injection 1,800 mg  1,800 mg Subcutaneous Once Michaelyn Barter, MD       dexamethasone (DECADRON) tablet 4 mg  4 mg Oral Once Michaelyn Barter, MD       diphenhydrAMINE (BENADRYL) capsule 50 mg  50 mg Oral Once Michaelyn Barter, MD       iron sucrose (VENOFER) 200 mg in sodium chloride 0.9 % 100 mL IVPB  200 mg Intravenous Once Louretta Shorten R, MD          .  PHYSICAL EXAMINATION:  Vitals:   07/04/22 1003  BP: (!) 162/59  Pulse: 61  Resp: 20  Temp: 98.6 F (37 C)  SpO2: 100%    Filed Weights   07/04/22 1003  Weight: 131 lb 8 oz (59.6 kg)     Physical Exam Vitals and nursing note reviewed.  HENT:     Head: Normocephalic and  atraumatic.     Mouth/Throat:     Pharynx: Oropharynx is clear.  Eyes:     Extraocular Movements: Extraocular movements intact.     Pupils: Pupils are equal, round, and reactive to light.  Cardiovascular:     Rate and Rhythm: Normal rate and regular rhythm.  Pulmonary:     Comments: Decreased breath sounds bilaterally.  Abdominal:     Palpations: Abdomen is soft.  Musculoskeletal:        General: Normal range of motion.     Cervical back: Normal range of motion.  Skin:    General: Skin is warm.  Neurological:     General: No focal deficit present.     Mental Status: She is alert and oriented to person, place, and time.  Psychiatric:        Behavior: Behavior normal.        Judgment: Judgment normal.      LABORATORY DATA:  I have reviewed the data as listed Lab Results  Component Value Date   WBC 4.1 07/04/2022   HGB 9.1 (L) 07/04/2022   HCT 28.7 (L) 07/04/2022   MCV 101.8 (H) 07/04/2022   PLT 382 07/04/2022   Recent Labs    05/09/22 0849 06/06/22 1258 07/04/22 0935  NA 135 134* 134*  K 3.9 3.5 3.9  CL 101 105 105  CO2 GLUCOSE 164* 162* 149*  BUN CREATININE 1.00 0.91 1.14*  CALCIUM 9.1 8.9 9.0  GFRNONAA 55* >60 47*  PROT 6.2* 6.1* 6.2*  ALBUMIN 3.7 3.7 3.7  AST ALT ALKPHOS 59 52 56  BILITOT 0.6 0.5 0.5    RADIOGRAPHIC STUDIES: I have personally reviewed the radiological images as listed and agreed with the findings in the report. No results found.  Multiple myeloma not having achieved remission (HCC) # STAGE I- MULTIPLE MYELOMA -Active [bone lesions; hypercalcemia; anemia; No renal insuffiencey;Bone marrow-32% plasma cell-   STANDARD Cytogenetics].  Currently on Revlimid-Dex- Dara SQ. Partial response noted;  JAN 2023-M protein = NEG;  K/L RATIO= 1. 7 = WNL;  stable.   # Proceed with Dara SQ today + DEX 20 mg PO [at home/pre-meds.]; Labs today reviewed;  acceptable for treatment today. On Revlimid at 10 mg dose 3  weeks on 1 week off; continue weekly -Dex 4 mg weekly [reflux like sypmtoms- see below].   Stable.   #Reflux-like symptoms-  Secondary to dexamethasone; on PPI- continue low-dose 4 mg weekly/WED.  and wants to hold off GI evaluation [per daughter]- improved. stable  # Anemia-moderate- multifactorial-MM/ IDA-  FEB 2024- Iron sat- 13 Ferritin- 192; continue trial of gentle iron. Hb [JAn 2024]- 9-10  stable. Proceed with venofer today.  # Mild swelling in leg/ likely dependant/compression- no concerns of DVT- trial of lasix 20 mg/day.    # Diarrhea- improved on Revlimid 10 mg a day- 3 weeks-ON & 1 week OFF. Stable; refilled lomotil- Stable.  # Diabetes-on oral hypoglycemic agents; on long acting insulin; 14 units of lantus on T/W/Thursday- Defer to PCP. PBF- BG-149- stable  # HTN: continue losartan; continue coreg; BP- 150-160- refilled coreg; stable  # Hypercalcemia secondary to multiple myeloma- [FEB 2023-Zometa ; calcitonin]-calcium today is 9.6. Zometa q 57M; stable  # Weight loss-s/p re-veluation with Joli - stable  #DVT/shingles prophylaxis: Aspirin/acyclovir.stable  # Vaccinations; Ok with flu/COVID shot  # IV access:PIV   [facility]- [phone]  zometa-q 3 months [next june 2024]- ; MM; K/L q 4 W  # DISPOSITION:  # proceed with  dara SQ; venofer-   # follow up in 4 weeks/Friday  MD-labs- cbc/cmp MM panel;K/L light chains;  Dara SQ; possible venofer; Dr.B   All questions were answered. The patient knows to call the clinic with any problems, questions or concerns.    Earna Coder, MD 07/04/2022 10:56 AM

## 2022-07-04 NOTE — Progress Notes (Signed)
Nutrition Follow-up:  Patient with multiple myeloma, currently on darzalex.    Met with patient during infusion.  Reports that appetite has been good.  "I have been doing good."  Dropped a tray on her foot recently and has swelling.  Yesterday made soup with carrots, onions, milk, cream of chicken soup    Medications: reviewed  Labs: reviewed  Anthropometrics:  Weight 131 lb 8 oz today  128 lb on 2/23 130 lb on 12/8 oz on 1/26 125 lb on 1/11 135 lb on 12/1 (swelling) 124 lb on 10/27   NUTRITION DIAGNOSIS: Inadequate oral intake improving    INTERVENTION:  Discussed ways to add more protein to her soup Continue high calorie, high protein foods    MONITORING, EVALUATION, GOAL: weight trends, intake   NEXT VISIT: Friday, May 17 during infusion  Fraya Ueda B. Freida Busman, RD, LDN Registered Dietitian [phone]

## 2022-07-07 LAB — KAPPA/LAMBDA LIGHT CHAINS
Kappa free light chain: 13.1 mg/L (ref 3.3–19.4)
Kappa, lambda light chain ratio: 1.27 (ref 0.26–1.65)
Lambda free light chains: 10.3 mg/L (ref 5.7–26.3)

## 2022-07-09 LAB — MULTIPLE MYELOMA PANEL, SERUM
Albumin SerPl Elph-Mcnc: 3.4 g/dL (ref 2.9–4.4)
Albumin/Glob SerPl: 1.7 (ref 0.7–1.7)
Alpha 1: 0.2 g/dL (ref 0.0–0.4)
Alpha2 Glob SerPl Elph-Mcnc: 0.6 g/dL (ref 0.4–1.0)
B-Globulin SerPl Elph-Mcnc: 0.8 g/dL (ref 0.7–1.3)
Gamma Glob SerPl Elph-Mcnc: 0.5 g/dL (ref 0.4–1.8)
Globulin, Total: 2.1 g/dL — ABNORMAL LOW (ref 2.2–3.9)
IgA: 74 mg/dL (ref 64–422)
IgG (Immunoglobin G), Serum: 599 mg/dL (ref 586–1602)
IgM (Immunoglobulin M), Srm: 23 mg/dL — ABNORMAL LOW (ref 26–217)
Total Protein ELP: 5.5 g/dL — ABNORMAL LOW (ref 6.0–8.5)

## 2022-07-10 ENCOUNTER — Other Ambulatory Visit: Payer: Self-pay | Admitting: Internal Medicine

## 2022-07-10 DIAGNOSIS — I10 Essential (primary) hypertension: Secondary | ICD-10-CM

## 2022-07-11 ENCOUNTER — Encounter: Payer: Self-pay | Admitting: Internal Medicine

## 2022-07-21 ENCOUNTER — Other Ambulatory Visit: Payer: Self-pay | Admitting: *Deleted

## 2022-07-21 DIAGNOSIS — C9000 Multiple myeloma not having achieved remission: Secondary | ICD-10-CM

## 2022-07-21 MED ORDER — LENALIDOMIDE 10 MG PO CAPS
10.0000 mg | ORAL_CAPSULE | Freq: Every day | ORAL | 0 refills | Status: DC
Start: 2022-07-21 — End: 2022-10-20

## 2022-07-27 ENCOUNTER — Other Ambulatory Visit: Payer: Self-pay | Admitting: Internal Medicine

## 2022-07-29 ENCOUNTER — Encounter: Payer: Self-pay | Admitting: Internal Medicine

## 2022-07-31 ENCOUNTER — Other Ambulatory Visit: Payer: Self-pay | Admitting: *Deleted

## 2022-07-31 DIAGNOSIS — C9000 Multiple myeloma not having achieved remission: Secondary | ICD-10-CM

## 2022-07-31 MED FILL — Iron Sucrose Inj 20 MG/ML (Fe Equiv): INTRAVENOUS | Qty: 10 | Status: AC

## 2022-08-01 ENCOUNTER — Inpatient Hospital Stay: Payer: Medicare Other

## 2022-08-01 ENCOUNTER — Other Ambulatory Visit: Payer: Self-pay | Admitting: Internal Medicine

## 2022-08-01 ENCOUNTER — Inpatient Hospital Stay: Payer: Medicare Other | Attending: Nurse Practitioner

## 2022-08-01 ENCOUNTER — Encounter: Payer: Self-pay | Admitting: Internal Medicine

## 2022-08-01 ENCOUNTER — Inpatient Hospital Stay (HOSPITAL_BASED_OUTPATIENT_CLINIC_OR_DEPARTMENT_OTHER): Payer: Medicare Other | Admitting: Internal Medicine

## 2022-08-01 VITALS — BP 154/69 | HR 75 | Temp 98.5°F | Ht 66.0 in | Wt 129.5 lb

## 2022-08-01 VITALS — BP 159/65 | HR 68

## 2022-08-01 DIAGNOSIS — D509 Iron deficiency anemia, unspecified: Secondary | ICD-10-CM | POA: Diagnosis not present

## 2022-08-01 DIAGNOSIS — I10 Essential (primary) hypertension: Secondary | ICD-10-CM | POA: Insufficient documentation

## 2022-08-01 DIAGNOSIS — Z5112 Encounter for antineoplastic immunotherapy: Secondary | ICD-10-CM | POA: Diagnosis not present

## 2022-08-01 DIAGNOSIS — E119 Type 2 diabetes mellitus without complications: Secondary | ICD-10-CM | POA: Insufficient documentation

## 2022-08-01 DIAGNOSIS — C9000 Multiple myeloma not having achieved remission: Secondary | ICD-10-CM

## 2022-08-01 LAB — CMP (CANCER CENTER ONLY)
ALT: 12 U/L (ref 0–44)
AST: 20 U/L (ref 15–41)
Albumin: 3.6 g/dL (ref 3.5–5.0)
Alkaline Phosphatase: 53 U/L (ref 38–126)
Anion gap: 9 (ref 5–15)
BUN: 17 mg/dL (ref 8–23)
CO2: 22 mmol/L (ref 22–32)
Calcium: 9.3 mg/dL (ref 8.9–10.3)
Chloride: 107 mmol/L (ref 98–111)
Creatinine: 0.99 mg/dL (ref 0.44–1.00)
GFR, Estimated: 56 mL/min — ABNORMAL LOW (ref >60)
Glucose, Bld: 213 mg/dL — ABNORMAL HIGH (ref 70–99)
Potassium: 3.6 mmol/L (ref 3.5–5.1)
Sodium: 138 mmol/L (ref 135–145)
Total Bilirubin: 0.4 mg/dL (ref 0.3–1.2)
Total Protein: 5.8 g/dL — ABNORMAL LOW (ref 6.5–8.1)

## 2022-08-01 LAB — CBC WITH DIFFERENTIAL/PLATELET
Abs Immature Granulocytes: 0.02 10*3/uL (ref 0.00–0.07)
Basophils Absolute: 0 10*3/uL (ref 0.0–0.1)
Basophils Relative: 0 %
Eosinophils Absolute: 0 10*3/uL (ref 0.0–0.5)
Eosinophils Relative: 0 %
HCT: 28.3 % — ABNORMAL LOW (ref 36.0–46.0)
Hemoglobin: 9.1 g/dL — ABNORMAL LOW (ref 12.0–15.0)
Immature Granulocytes: 0 %
Lymphocytes Relative: 22 %
Lymphs Abs: 1.3 10*3/uL (ref 0.7–4.0)
MCH: 33.5 pg (ref 26.0–34.0)
MCHC: 32.2 g/dL (ref 30.0–36.0)
MCV: 104 fL — ABNORMAL HIGH (ref 80.0–100.0)
Monocytes Absolute: 0.5 10*3/uL (ref 0.1–1.0)
Monocytes Relative: 8 %
Neutro Abs: 4 10*3/uL (ref 1.7–7.7)
Neutrophils Relative %: 70 %
Platelets: 274 10*3/uL (ref 150–400)
RBC: 2.72 MIL/uL — ABNORMAL LOW (ref 3.87–5.11)
RDW: 15.9 % — ABNORMAL HIGH (ref 11.5–15.5)
WBC: 5.9 10*3/uL (ref 4.0–10.5)
nRBC: 0 % (ref 0.0–0.2)

## 2022-08-01 MED ORDER — SODIUM CHLORIDE 0.9 % IV SOLN
200.0000 mg | Freq: Once | INTRAVENOUS | Status: AC
  Administered 2022-08-01: 200 mg via INTRAVENOUS
  Filled 2022-08-01: qty 200

## 2022-08-01 MED ORDER — DIPHENHYDRAMINE HCL 25 MG PO CAPS: 50.0000 mg | ORAL_CAPSULE | Freq: Once | ORAL | Status: DC

## 2022-08-01 MED ORDER — DARATUMUMAB-HYALURONIDASE-FIHJ 1800-30000 MG-UT/15ML ~~LOC~~ SOLN
1800.0000 mg | Freq: Once | SUBCUTANEOUS | Status: AC
  Administered 2022-08-01: 1800 mg via SUBCUTANEOUS
  Filled 2022-08-01: qty 15

## 2022-08-01 MED ORDER — ACETAMINOPHEN 325 MG PO TABS: 650.0000 mg | ORAL_TABLET | Freq: Once | ORAL | Status: DC

## 2022-08-01 MED ORDER — SODIUM CHLORIDE 0.9 % IV SOLN
Freq: Once | INTRAVENOUS | Status: AC
  Filled 2022-08-01: qty 250

## 2022-08-01 MED ORDER — DEXAMETHASONE 4 MG PO TABS: 4.0000 mg | ORAL_TABLET | Freq: Once | ORAL | Status: DC

## 2022-08-01 NOTE — Progress Notes (Signed)
[facility] CONSULT NOTE  Patient Care Team: Duanne Limerick, MD as PCP - General (Family Medicine) Lajean Manes, [facility] (Inactive) (Pharmacist) Earna Coder, MD as Consulting Physician (Oncology)  CHIEF COMPLAINTS/PURPOSE OF CONSULTATION: Multiple myeloma  Oncology History Overview Note  # MULTIPLE MYELOMA  [FEB 2023-hypercalcemia status changes]-Active [bone lesions; hypercalcemia; anemia; No renal insuffiencey;Bone marrow-32% plasma cell-   STANDARD Cytogenetics].FEB 2023- S/p dexamethasone 20 mg q day x4.   # REV [15 mg -3 week-On and 1 week-OFF]-DARA; May 2023-discontinued rev 15 diarrhea/hypotension  # MAY 2023- dara-Rev 10 mg 3w/1w   # FEB 2023-hypercalcemia [[facility]-status post calcitonin; bisphosphonate]  BONE MARROW, ASPIRATE, CLOT, CORE:  -Hypercellular bone marrow with plasma cell neoplasm  -See comment   PERIPHERAL BLOOD:  -Normocytic-normochromic anemia   COMMENT:   The bone marrow is hypercellular for age with increased number of  atypical plasma cells representing 32% of all cells in the aspirate  associated with interstitial infiltrates and numerous variably sized  clusters in the clot/biopsy sections.  The plasma cells display weak  kappa light chain restriction consistent with plasma cell neoplasm.  Correlation with cytogenetic and FISH studies is recommended   MICROSCOPIC DESCRIPTION:   PERIPHERAL BLOOD SMEAR: The red blood cells display mild  anisopoikilocytosis with mild polychromasia.  The white blood cells are  normal number with scattered hypogranular neutrophils. An occasional  myelocyte and large atypical mononuclear cells are seen on scan. The  platelets are normal in number.    Multiple myeloma not having achieved remission (HCC)  05/15/2021 Initial Diagnosis   Multiple myeloma not having achieved remission (HCC)   06/27/2021 - 10/17/2021 Chemotherapy   Patient is on Treatment Plan : MYELOMA Daratumumab SQ + Lenalidomide +  Dexamethasone (DaraRd) q28d     06/27/2021 -  Chemotherapy   Patient is on Treatment Plan : MYELOMA RELAPSED REFRACTORY Daratumumab SQ + Lenalidomide + Dexamethasone (DaraRd) q28d     07/11/2021 Cancer Staging   Staging form: Plasma Cell Myeloma and Plasma Cell Disorders, AJCC 8th Edition - Clinical: Beta-2-microglobulin (mg/L): 2.6, Albumin (g/dL): 3.9, ISS: Stage I - Signed by Earna Coder, MD on 07/11/2021 Stage prefix: Initial diagnosis Beta 2 microglobulin range (mg/L): Less than 3.5 Albumin range (g/dL): Greater than or equal to 3.5     HISTORY OF PRESENTING ILLNESS: pt in a wheel chair; accompanied by her daughter.   Cristina Morgan 88 y.o.  female with  multiple myeloma currently on Rev-Dex is proceed with Dara SQ today.    Pt states she pulled a tick off her lt ear last weekend. Complains of bilateral leg swelling. No falls.   Chronic mild fatigue.  Otherwise no worsening shortness of breath or cough. Diarrhea well controlled with last loose stool last week improved with Lomotil.   Denies any nausea vomiting.  Denies any blood in stools or black-colored stools.  Patient states her back pain is improved.  Denies any worsening joint pains.   Review of Systems  Constitutional:  Positive for malaise/fatigue and weight loss. Negative for chills, diaphoresis and fever.  HENT:  Negative for nosebleeds and sore throat.   Eyes:  Negative for double vision.  Respiratory:  Negative for cough, hemoptysis, sputum production, shortness of breath and wheezing.   Cardiovascular:  Negative for chest pain, palpitations, orthopnea and leg swelling.  Gastrointestinal:  Negative for abdominal pain, blood in stool, constipation, diarrhea, heartburn, melena, nausea and vomiting.  Genitourinary:  Negative for dysuria, frequency and urgency.  Musculoskeletal:  Positive for back pain and joint pain.  Skin: Negative.  Negative for itching and rash.  Neurological:  Negative for dizziness,  tingling, focal weakness, weakness and headaches.  Endo/Heme/Allergies:  Does not bruise/bleed easily.  Psychiatric/Behavioral:  Negative for depression. The patient is not nervous/anxious and does not have insomnia.     MEDICAL HISTORY:  Past Medical History:  Diagnosis Date   Anemia    CAD (coronary artery disease)    Cataracts, bilateral    Closed compression fracture of first lumbar vertebra (HCC)    Closed compression fracture of second lumbar vertebra (HCC)    Diabetes mellitus without complication (HCC)    High cholesterol    History of pneumonia 2010   [place]   History of uterine fibroid    Hypercalcemia    Hyperlipidemia    Hypertension    Lytic bone lesions on xray    Multiple myeloma (HCC)    Osteoporosis    Primary osteoarthritis of right knee     SURGICAL HISTORY: Past Surgical History:  Procedure Laterality Date   CATARACT EXTRACTION Bilateral 2014   COLONOSCOPY  2011   cleared for 5 yrs- [facility]   ECTOPIC PREGNANCY SURGERY      SOCIAL HISTORY: Social History   Socioeconomic History   Marital status: Married    Spouse name: Not on file   Number of children: 2   Years of education: Not on file   Highest education level: 12th grade  Occupational History   Occupation: Retired  Tobacco Use   Smoking status: Never    Passive exposure: Never   Smokeless tobacco: Never   Tobacco comments:    smoking cessation materials not required  Vaping Use   Vaping Use: Never used  Substance and Sexual Activity   Alcohol use: No   Drug use: No   Sexual activity: Not Currently  Other Topics Concern   Not on file  Social History Narrative   [place] with husband; drives. Never smoked; no alcohol. Daughters live close.    Social Determinants of Health   Financial Resource Strain: Low Risk  (11/13/2021)   Overall Financial Resource Strain (CARDIA)    Difficulty of Paying Living Expenses: Not hard at all  Food Insecurity: No Food Insecurity (11/13/2021)    Hunger Vital Sign    Worried About Running Out of Food in the Last Year: Never true    Ran Out of Food in the Last Year: Never true  Transportation Needs: No Transportation Needs (11/13/2021)   PRAPARE - Administrator, Civil Service (Medical): No    Lack of Transportation (Non-Medical): No  Physical Activity: Sufficiently Active (11/13/2021)   Exercise Vital Sign    Days of Exercise per Week: 7 days    Minutes of Exercise per Session: 30 min  Stress: No Stress Concern Present (11/13/2021)   [employer] of Occupational Health - Occupational Stress Questionnaire    Feeling of Stress : Not at all  Social Connections: Moderately Integrated (11/13/2021)   Social Connection and Isolation Panel [NHANES]    Frequency of Communication with Friends and Family: Twice a week    Frequency of Social Gatherings with Friends and Family: More than three times a week    Attends Religious Services: 1 to 4 times per year    Active Member of [employer] or Organizations: No    Attends Banker Meetings: Never    Marital Status: Married  Catering manager Violence: Not At Risk (11/13/2021)  Humiliation, Afraid, Rape, and Kick questionnaire    Fear of Current or Ex-Partner: No    Emotionally Abused: No    Physically Abused: No    Sexually Abused: No    FAMILY HISTORY: Family History  Problem Relation Age of Onset   Cancer Mother        breast   Heart disease Mother    Diabetes Father    Heart disease Father    Heart attack Father    Heart attack Brother    Diabetes Brother     ALLERGIES:  is allergic to latex and penicillins.  MEDICATIONS:  Current Outpatient Medications  Medication Sig Dispense Refill   ACCU-CHEK GUIDE test strip USE 1 STRIP TO CHECK GLUCOSE ONCE DAILY AS DIRECTED 100 each 0   Accu-Chek Softclix Lancets lancets USE 1  TO CHECK GLUCOSE ONCE DAILY 100 each 0   acetaminophen (TYLENOL) 325 MG tablet Take 325 mg by mouth 2 (two) times daily. START 2 days  prior to chemo infusion. Take For 2 days; Do NOT take on the day of infusion     acyclovir (ZOVIRAX) 400 MG tablet TAKE ONE TABLET BY MOUTH TWICE DAILY 60 tablet 4   aspirin EC 81 MG tablet Take 81 mg by mouth daily. Swallow whole.     BD PEN NEEDLE NANO 2ND GEN 32G X 4 MM MISC USE 1  ONCE DAILY 100 each 0   Blood Glucose Monitoring Suppl (ACCU-CHEK GUIDE) w/Device KIT USE AS DIRECTED TO CHECK GLUCOSE     Calcium Carbonate-Vit D-Min (CALCIUM 1200 PO) Take 1 capsule by mouth daily.     carvedilol (COREG) 3.125 MG tablet TAKE ONE TABLET BY MOUTH TWICE DAILY 60 tablet 1   cholecalciferol (VITAMIN D3) 25 MCG (1000 UNIT) tablet Take 1,000 Units by mouth daily.     dexamethasone (DECADRON) 4 MG tablet Take 1 tablet (4 mg total) by mouth 2 (two) times daily. Start 2 days prior to infusion; Take it for 2 days. 60 tablet 3   dexamethasone (DECADRON) 4 MG tablet Take 5 tablets (20 mg total) by mouth as directed. Take one hour before monthly Darzalex injections. 15 tablet 3   diphenoxylate-atropine (LOMOTIL) 2.5-0.025 MG tablet Take 1 tablet by mouth 4 times daily as needed for diarrhea or loose stools. Take along with immodium. 60 tablet 3   furosemide (LASIX) 20 MG tablet Take 1 tablet (20 mg total) by mouth daily. Take it in AM. 7 tablet 0   Glucerna (GLUCERNA) LIQD Take 237 mLs by mouth.     hydrOXYzine (ATARAX) 10 MG tablet Take 1 tablet (10 mg total) by mouth at bedtime as needed. 90 tablet 1   Infant Foods (GOOD START 2 ESSENTIALS/IRON PO) Take 1 tablet by mouth daily.     insulin glargine (LANTUS SOLOSTAR) 100 UNIT/ML Solostar Pen Inject 10 Units into the skin daily. 6 units every day except Wed and Thursday- 8 units 15 mL 0   lenalidomide (REVLIMID) 10 MG capsule Take 1 capsule (10 mg total) by mouth daily. Take for 21 days, then hold for 7 days. Repeat every 28 days. 21 capsule 0   losartan (COZAAR) 25 MG tablet TAKE ONE TABLET BY MOUTH EVERY EVENING 90 tablet 1   lovastatin (MEVACOR) 40 MG tablet  Take 1 tablet (40 mg total) by mouth every evening. 90 tablet 1   metFORMIN (GLUCOPHAGE) 1000 MG tablet Take 1 tablet (1,000 mg total) by mouth 2 (two) times daily. 180 tablet 1   montelukast (  SINGULAIR) 10 MG tablet TAKE ONE TABLET BY MOUTH ONCE DAILY, STARTING TWO DAYS prior TO infusion. take FOR TWO DAYS AND DO not take ON DAY of infusion 20 tablet 1   ondansetron (ZOFRAN) 8 MG tablet One pill every 8 hours as needed for nausea/vomitting. 40 tablet 1   pantoprazole (PROTONIX) 40 MG tablet Take 1 tablet (40 mg total) by mouth daily. 90 tablet 0   prochlorperazine (COMPAZINE) 10 MG tablet Take 1 tablet (10 mg total) by mouth every 6 (six) hours as needed for nausea or vomiting. 40 tablet 1   sertraline (ZOLOFT) 25 MG tablet TAKE ONE TABLET BY MOUTH ONCE DAILY 90 tablet 1   triamcinolone (NASACORT) 55 MCG/ACT AERO nasal inhaler 2 sprays 2 (two) times daily.     No current facility-administered medications for this visit.    PHYSICAL EXAMINATION:  Vitals:   08/01/22 1308  BP: (!) 154/69  Pulse: 75  Temp: 98.5 F (36.9 C)  SpO2: 99%    Filed Weights   08/01/22 1308  Weight: 129 lb 8 oz (58.7 kg)     Physical Exam Vitals and nursing note reviewed.  HENT:     Head: Normocephalic and atraumatic.     Mouth/Throat:     Pharynx: Oropharynx is clear.  Eyes:     Extraocular Movements: Extraocular movements intact.     Pupils: Pupils are equal, round, and reactive to light.  Cardiovascular:     Rate and Rhythm: Normal rate and regular rhythm.  Pulmonary:     Comments: Decreased breath sounds bilaterally.  Abdominal:     Palpations: Abdomen is soft.  Musculoskeletal:        General: Normal range of motion.     Cervical back: Normal range of motion.  Skin:    General: Skin is warm.  Neurological:     General: No focal deficit present.     Mental Status: She is alert and oriented to person, place, and time.  Psychiatric:        Behavior: Behavior normal.        Judgment:  Judgment normal.      LABORATORY DATA:  I have reviewed the data as listed Lab Results  Component Value Date   WBC 5.9 08/01/2022   HGB 9.1 (L) 08/01/2022   HCT 28.3 (L) 08/01/2022   MCV 104.0 (H) 08/01/2022   PLT 274 08/01/2022   Recent Labs    06/06/22 1258 07/04/22 0935 08/01/22 1311  NA 134* 134* 138  K 3.5 3.9 3.6  CL 105 105 107  CO2 24 23 22   GLUCOSE 162* 149* 213*  BUN 18 19 17   CREATININE 0.91 1.14* 0.99  CALCIUM 8.9 9.0 9.3  GFRNONAA >60 47* 56*  PROT 6.1* 6.2* 5.8*  ALBUMIN 3.7 3.7 3.6  AST 24 23 20   ALT 15 19 12   ALKPHOS 52 56 53  BILITOT 0.5 0.5 0.4    RADIOGRAPHIC STUDIES: I have personally reviewed the radiological images as listed and agreed with the findings in the report. No results found.  Multiple myeloma not having achieved remission (HCC) # STAGE I- MULTIPLE MYELOMA -Active [bone lesions; hypercalcemia; anemia; No renal insuffiencey;Bone marrow-32% plasma cell-   STANDARD Cytogenetics].  Currently on Revlimid-Dex- Dara SQ. Partial response noted;  APRIL 2024-M protein = NEG;  K/L RATIO= 1. 6= WNL;  Stable.   # Proceed with Dara SQ today + DEX 20 mg PO [at home/pre-meds.]; Labs today reviewed;  acceptable for treatment today. On Revlimid at  10 mg dose 3 weeks on 1 week off; continue weekly -Dex 4 mg weekly [reflux like sypmtoms- see below].   Stable.   #Reflux-like symptoms-  Secondary to dexamethasone; on PPI- continue low-dose 4 mg weekly/WED.  and wants to hold off GI evaluation [per daughter]- improved. Stable.   # Anemia-moderate- multifactorial-MM/ IDA-  FEB 2024- Iron sat- 13 Ferritin- 192; continue trial of gentle iron. Hb [JAn 2024]- 9-10  stable. Proceed with venofer today. Will repeat Iron studies;ferritin.   # Mild swelling in leg/ likely dependant/compression- no concerns of DVT- trial of lasix 20 mg/day.    # Diarrhea- improved on Revlimid 10 mg a day- 3 weeks-ON & 1 week OFF. Stable; refilled lomotil- Stable.  # Diabetes-on  oral hypoglycemic agents; on long acting insulin; 14 units of lantus on T/W/Thursday- Defer to PCP. PBF- BG-149- stable  # HTN: continue losartan; continue coreg; BP- 150-160- refilled coreg; stable  # Hypercalcemia secondary to multiple myeloma- [FEB 2023-Zometa ; calcitonin]-calcium today is 9.6. Zometa q 49M; stable  # Weight loss-s/p re-veluation with Joli - stable  #DVT/shingles prophylaxis: Aspirin/acyclovir.stable  # Vaccinations; Ok with flu/COVID shot  # IV access:PIV   Shela [facility]- [phone]  zometa-q 3 months [next june 2024]- ; MM; K/L q 4 W  # DISPOSITION:  # proceed with  dara SQ; venofer-   # follow up in 4 weeks/Friday  MD-labs- cbc/cmp; iron studies;ferritin; MM panel;K/L light chains;  Dara SQ; possible venofer; Zometa Dr.B   All questions were answered. The patient knows to call the clinic with any problems, questions or concerns.    Earna Coder, MD 08/01/2022 1:58 PM

## 2022-08-01 NOTE — Progress Notes (Signed)
Pt states she pulled a tick off her lt ear last weekend. No concerns.

## 2022-08-01 NOTE — Progress Notes (Signed)
Nutrition Follow-up:  Patient with multiple myeloma, currently on daratumumab, IV venofer and zometa.  Met with patient during IV venofer.  Patient reports that she has been on fluid pill and lost some weight.  Says that she has been eating good and snacking.      Medications: reviewed  Labs: glucose 213  Anthropometrics:   Weight 129 lb 8 oz on 5/17 131 lb 8 oz on 4/19 128 lb on 2/23 130 lb on 12.8 oz on 1/26  Weight stable. Likely weight change is related to fluid  NUTRITION DIAGNOSIS: Inadequate oral intake improving    INTERVENTION:  Encouraged patient to continue eating high calorie, high protein foods Overall weight is stable     MONITORING, EVALUATION, GOAL: weight trends, intake   NEXT VISIT: as needed  Wolfe Camarena B. Freida Busman, RD, LDN Registered Dietitian [phone]

## 2022-08-01 NOTE — Assessment & Plan Note (Addendum)
#   STAGE I- MULTIPLE MYELOMA -Active [bone lesions; hypercalcemia; anemia; No renal insuffiencey;Bone marrow-32% plasma cell-   STANDARD Cytogenetics].  Currently on Revlimid-Dex- Dara SQ. Partial response noted;  APRIL 2024-M protein = NEG;  K/L RATIO= 1. 6= WNL;  Stable.   # Proceed with Dara SQ today + DEX 20 mg PO [at home/pre-meds.]; Labs today reviewed;  acceptable for treatment today. On Revlimid at 10 mg dose 3 weeks on 1 week off; continue weekly -Dex 4 mg weekly [reflux like sypmtoms- see below].   Stable.   #Reflux-like symptoms-  Secondary to dexamethasone; on PPI- continue low-dose 4 mg weekly/WED.  and wants to hold off GI evaluation [per daughter]- improved. Stable.   # Anemia-moderate- multifactorial-MM/ IDA-  FEB 2024- Iron sat- 13 Ferritin- 192; continue trial of gentle iron. Hb [JAn 2024]- 9-10  stable. Proceed with venofer today. Will repeat Iron studies;ferritin.   # Mild swelling in leg/ likely dependant/compression- no concerns of DVT- trial of lasix 20 mg/day.    # Diarrhea- improved on Revlimid 10 mg a day- 3 weeks-ON & 1 week OFF. Stable; refilled lomotil- Stable.  # Diabetes-on oral hypoglycemic agents; on long acting insulin; 14 units of lantus on T/W/Thursday- Defer to PCP. PBF- BG-149- stable  # HTN: continue losartan; continue coreg; BP- 150-160- refilled coreg; stable  # Hypercalcemia secondary to multiple myeloma- [FEB 2023-Zometa ; calcitonin]-calcium today is 9.6. Zometa q 46M; stable  # Weight loss-s/p re-veluation with Joli - stable  #DVT/shingles prophylaxis: Aspirin/acyclovir.stable  # Vaccinations; Ok with flu/COVID shot  # IV access:PIV   [facility]hel[facility]- [phone]  zometa-q 3 months [next june 2024]- ; MM; K/L q 4 W  # DISPOSITION:  # proceed with  dara SQ; venofer-   # follow up in 4 weeks/Friday  MD-labs- cbc/cmp; iron studies;ferritin; MM panel;K/L light chains;  Dara SQ; possible venofer; Zometa Dr.B

## 2022-08-01 NOTE — Patient Instructions (Signed)
[facility]  Discharge Instructions: Thank you for choosing [facility] to provide your oncology and hematology care.  If you have a lab appointment with the Cancer Center, please go directly to the Cancer Center and check in at the registration area.  Wear comfortable clothing and clothing appropriate for easy access to any Portacath or PICC line.   We strive to give you quality time with your provider. You may need to reschedule your appointment if you arrive late (15 or more minutes).  Arriving late affects you and other patients whose appointments are after yours.  Also, if you miss three or more appointments without notifying the office, you may be dismissed from the clinic at the provider's discretion.      For prescription refill requests, have your pharmacy contact our office and allow 72 hours for refills to be completed.    Today you received the following chemotherapy and/or immunotherapy agents Darzalex      To help prevent nausea and vomiting after your treatment, we encourage you to take your nausea medication as directed.  BELOW ARE SYMPTOMS THAT SHOULD BE REPORTED IMMEDIATELY: *FEVER GREATER THAN 100.4 F (38 C) OR HIGHER *CHILLS OR SWEATING *NAUSEA AND VOMITING THAT IS NOT CONTROLLED WITH YOUR NAUSEA MEDICATION *UNUSUAL SHORTNESS OF BREATH *UNUSUAL BRUISING OR BLEEDING *URINARY PROBLEMS (pain or burning when urinating, or frequent urination) *BOWEL PROBLEMS (unusual diarrhea, constipation, pain near the anus) TENDERNESS IN MOUTH AND THROAT WITH OR WITHOUT PRESENCE OF ULCERS (sore throat, sores in mouth, or a toothache) UNUSUAL RASH, SWELLING OR PAIN  UNUSUAL VAGINAL DISCHARGE OR ITCHING   Items with * indicate a potential emergency and should be followed up as soon as possible or go to the Emergency Department if any problems should occur.  Please show the CHEMOTHERAPY ALERT CARD or IMMUNOTHERAPY ALERT CARD at check-in to  the Emergency Department and triage nurse.  Should you have questions after your visit or need to cancel or reschedule your appointment, please contact [facility] [facility]  [phone] and follow the prompts.  Office hours are 8:00 a.m. to 4:30 p.m. Monday - Friday. Please note that voicemails left after 4:00 p.m. may not be returned until the following business day.  We are closed weekends and major holidays. You have access to a nurse at all times for urgent questions. Please call the main number to the clinic [phone] and follow the prompts.  For any non-urgent questions, you may also contact your provider using MyChart. We now offer e-Visits for anyone 18 and older to request care online for non-urgent symptoms. For details visit [email or web address].   Also download the MyChart app! Go to the app store, search "MyChart", open the app, select [facility], and log in with your MyChart username and password.      

## 2022-08-05 LAB — KAPPA/LAMBDA LIGHT CHAINS
Kappa free light chain: 9.2 mg/L (ref 3.3–19.4)
Kappa, lambda light chain ratio: 1.18 (ref 0.26–1.65)
Lambda free light chains: 7.8 mg/L (ref 5.7–26.3)

## 2022-08-12 LAB — MULTIPLE MYELOMA PANEL, SERUM
Albumin SerPl Elph-Mcnc: 3.3 g/dL (ref 2.9–4.4)
Albumin/Glob SerPl: 1.7 (ref 0.7–1.7)
Alpha 1: 0.2 g/dL (ref 0.0–0.4)
Alpha2 Glob SerPl Elph-Mcnc: 0.6 g/dL (ref 0.4–1.0)
B-Globulin SerPl Elph-Mcnc: 0.8 g/dL (ref 0.7–1.3)
Gamma Glob SerPl Elph-Mcnc: 0.4 g/dL (ref 0.4–1.8)
Globulin, Total: 2 g/dL — ABNORMAL LOW (ref 2.2–3.9)
IgA: 55 mg/dL — ABNORMAL LOW (ref 64–422)
IgG (Immunoglobin G), Serum: 562 mg/dL — ABNORMAL LOW (ref 586–1602)
IgM (Immunoglobulin M), Srm: 26 mg/dL (ref 26–217)
Total Protein ELP: 5.3 g/dL — ABNORMAL LOW (ref 6.0–8.5)

## 2022-08-18 ENCOUNTER — Telehealth: Payer: Self-pay | Admitting: Pharmacist

## 2022-08-18 NOTE — Telephone Encounter (Signed)
Patient's daughter Elonda Husky called to request a refill of her mom's dexamethasone weekly rx. In reviewing medication list, there are a few dexamethasone prescriptions with varying instruction. Spoke with Dr. Katriana Dortch Schwartz, and he would like Ms. Felty to continue with the 20mg  dexamethasone one hour before monthly Darzalex injections only.   Cassandra stated her understanding of the plan and reports having the monthly dexamethasone on hand at home.

## 2022-08-28 ENCOUNTER — Other Ambulatory Visit: Payer: Self-pay | Admitting: Family Medicine

## 2022-08-29 ENCOUNTER — Inpatient Hospital Stay (HOSPITAL_BASED_OUTPATIENT_CLINIC_OR_DEPARTMENT_OTHER): Payer: Medicare Other | Admitting: Internal Medicine

## 2022-08-29 ENCOUNTER — Encounter: Payer: Self-pay | Admitting: Internal Medicine

## 2022-08-29 ENCOUNTER — Other Ambulatory Visit: Payer: Self-pay

## 2022-08-29 ENCOUNTER — Inpatient Hospital Stay: Payer: Medicare Other

## 2022-08-29 ENCOUNTER — Other Ambulatory Visit (HOSPITAL_COMMUNITY): Payer: Self-pay

## 2022-08-29 ENCOUNTER — Inpatient Hospital Stay: Payer: Medicare Other | Attending: Nurse Practitioner

## 2022-08-29 VITALS — BP 154/58 | HR 76 | Temp 97.0°F | Ht 66.0 in | Wt 133.8 lb

## 2022-08-29 DIAGNOSIS — Z7982 Long term (current) use of aspirin: Secondary | ICD-10-CM | POA: Insufficient documentation

## 2022-08-29 DIAGNOSIS — M549 Dorsalgia, unspecified: Secondary | ICD-10-CM | POA: Diagnosis not present

## 2022-08-29 DIAGNOSIS — M7989 Other specified soft tissue disorders: Secondary | ICD-10-CM | POA: Insufficient documentation

## 2022-08-29 DIAGNOSIS — R600 Localized edema: Secondary | ICD-10-CM | POA: Diagnosis not present

## 2022-08-29 DIAGNOSIS — Z5112 Encounter for antineoplastic immunotherapy: Secondary | ICD-10-CM | POA: Insufficient documentation

## 2022-08-29 DIAGNOSIS — R634 Abnormal weight loss: Secondary | ICD-10-CM | POA: Insufficient documentation

## 2022-08-29 DIAGNOSIS — E7800 Pure hypercholesterolemia, unspecified: Secondary | ICD-10-CM | POA: Diagnosis not present

## 2022-08-29 DIAGNOSIS — I471 Supraventricular tachycardia, unspecified: Secondary | ICD-10-CM | POA: Diagnosis not present

## 2022-08-29 DIAGNOSIS — C9000 Multiple myeloma not having achieved remission: Secondary | ICD-10-CM | POA: Diagnosis not present

## 2022-08-29 DIAGNOSIS — R5383 Other fatigue: Secondary | ICD-10-CM | POA: Insufficient documentation

## 2022-08-29 DIAGNOSIS — D509 Iron deficiency anemia, unspecified: Secondary | ICD-10-CM | POA: Insufficient documentation

## 2022-08-29 DIAGNOSIS — M1711 Unilateral primary osteoarthritis, right knee: Secondary | ICD-10-CM | POA: Diagnosis not present

## 2022-08-29 DIAGNOSIS — E119 Type 2 diabetes mellitus without complications: Secondary | ICD-10-CM | POA: Diagnosis not present

## 2022-08-29 DIAGNOSIS — M810 Age-related osteoporosis without current pathological fracture: Secondary | ICD-10-CM | POA: Diagnosis not present

## 2022-08-29 DIAGNOSIS — Z8249 Family history of ischemic heart disease and other diseases of the circulatory system: Secondary | ICD-10-CM | POA: Insufficient documentation

## 2022-08-29 DIAGNOSIS — R197 Diarrhea, unspecified: Secondary | ICD-10-CM | POA: Insufficient documentation

## 2022-08-29 DIAGNOSIS — I2510 Atherosclerotic heart disease of native coronary artery without angina pectoris: Secondary | ICD-10-CM | POA: Diagnosis not present

## 2022-08-29 DIAGNOSIS — Z7984 Long term (current) use of oral hypoglycemic drugs: Secondary | ICD-10-CM | POA: Diagnosis not present

## 2022-08-29 DIAGNOSIS — Z79899 Other long term (current) drug therapy: Secondary | ICD-10-CM | POA: Insufficient documentation

## 2022-08-29 DIAGNOSIS — R0989 Other specified symptoms and signs involving the circulatory and respiratory systems: Secondary | ICD-10-CM | POA: Diagnosis not present

## 2022-08-29 DIAGNOSIS — Z79624 Long term (current) use of inhibitors of nucleotide synthesis: Secondary | ICD-10-CM | POA: Insufficient documentation

## 2022-08-29 DIAGNOSIS — I10 Essential (primary) hypertension: Secondary | ICD-10-CM | POA: Diagnosis not present

## 2022-08-29 DIAGNOSIS — Z794 Long term (current) use of insulin: Secondary | ICD-10-CM | POA: Diagnosis not present

## 2022-08-29 DIAGNOSIS — Z7961 Long term (current) use of immunomodulator: Secondary | ICD-10-CM | POA: Diagnosis not present

## 2022-08-29 DIAGNOSIS — I509 Heart failure, unspecified: Secondary | ICD-10-CM

## 2022-08-29 LAB — CBC WITH DIFFERENTIAL/PLATELET
Abs Immature Granulocytes: 0.01 10*3/uL (ref 0.00–0.07)
Basophils Absolute: 0.1 10*3/uL (ref 0.0–0.1)
Basophils Relative: 1 %
Eosinophils Absolute: 0.2 10*3/uL (ref 0.0–0.5)
Eosinophils Relative: 4 %
HCT: 30.7 % — ABNORMAL LOW (ref 36.0–46.0)
Hemoglobin: 10 g/dL — ABNORMAL LOW (ref 12.0–15.0)
Immature Granulocytes: 0 %
Lymphocytes Relative: 47 %
Lymphs Abs: 1.7 10*3/uL (ref 0.7–4.0)
MCH: 33.4 pg (ref 26.0–34.0)
MCHC: 32.6 g/dL (ref 30.0–36.0)
MCV: 102.7 fL — ABNORMAL HIGH (ref 80.0–100.0)
Monocytes Absolute: 0.6 10*3/uL (ref 0.1–1.0)
Monocytes Relative: 16 %
Neutro Abs: 1.2 10*3/uL — ABNORMAL LOW (ref 1.7–7.7)
Neutrophils Relative %: 32 %
Platelets: 334 10*3/uL (ref 150–400)
RBC: 2.99 MIL/uL — ABNORMAL LOW (ref 3.87–5.11)
RDW: 15.5 % (ref 11.5–15.5)
WBC: 3.7 10*3/uL — ABNORMAL LOW (ref 4.0–10.5)
nRBC: 0 % (ref 0.0–0.2)

## 2022-08-29 LAB — CMP (CANCER CENTER ONLY)
ALT: 15 U/L (ref 0–44)
AST: 18 U/L (ref 15–41)
Albumin: 3.7 g/dL (ref 3.5–5.0)
Alkaline Phosphatase: 60 U/L (ref 38–126)
Anion gap: 9 (ref 5–15)
BUN: 20 mg/dL (ref 8–23)
CO2: 23 mmol/L (ref 22–32)
Calcium: 9.7 mg/dL (ref 8.9–10.3)
Chloride: 104 mmol/L (ref 98–111)
Creatinine: 0.9 mg/dL (ref 0.44–1.00)
GFR, Estimated: >60 mL/min (ref >60)
Glucose, Bld: 155 mg/dL — ABNORMAL HIGH (ref 70–99)
Potassium: 3.6 mmol/L (ref 3.5–5.1)
Sodium: 136 mmol/L (ref 135–145)
Total Bilirubin: 0.6 mg/dL (ref 0.3–1.2)
Total Protein: 6.6 g/dL (ref 6.5–8.1)

## 2022-08-29 LAB — FERRITIN: Ferritin: 282 ng/mL (ref 11–307)

## 2022-08-29 LAB — IRON AND TIBC
Iron: 44 ug/dL (ref 28–170)
Saturation Ratios: 13 % (ref 10.4–31.8)
TIBC: 332 ug/dL (ref 250–450)
UIBC: 288 ug/dL

## 2022-08-29 LAB — BRAIN NATRIURETIC PEPTIDE: B Natriuretic Peptide: 186.1 pg/mL — ABNORMAL HIGH (ref 0.0–100.0)

## 2022-08-29 MED ORDER — ACETAMINOPHEN 325 MG PO TABS
650.0000 mg | ORAL_TABLET | Freq: Once | ORAL | Status: AC
  Administered 2022-08-29: 650 mg via ORAL
  Filled 2022-08-29: qty 2

## 2022-08-29 MED ORDER — SODIUM CHLORIDE 0.9 % IV SOLN
200.0000 mg | INTRAVENOUS | Status: DC
  Administered 2022-08-29: 200 mg via INTRAVENOUS
  Filled 2022-08-29: qty 200

## 2022-08-29 MED ORDER — DEXAMETHASONE 4 MG PO TABS
20.0000 mg | ORAL_TABLET | Freq: Once | ORAL | Status: AC
  Administered 2022-08-29: 20 mg via ORAL

## 2022-08-29 MED ORDER — DARATUMUMAB-HYALURONIDASE-FIHJ 1800-30000 MG-UT/15ML ~~LOC~~ SOLN
1800.0000 mg | Freq: Once | SUBCUTANEOUS | Status: AC
  Administered 2022-08-29: 1800 mg via SUBCUTANEOUS
  Filled 2022-08-29: qty 15

## 2022-08-29 MED ORDER — SODIUM CHLORIDE 0.9 % IV SOLN
Freq: Once | INTRAVENOUS | Status: AC
  Filled 2022-08-29: qty 250

## 2022-08-29 MED ORDER — ZOLEDRONIC ACID 4 MG/5ML IV CONC
3.0000 mg | Freq: Once | INTRAVENOUS | Status: AC
  Administered 2022-08-29: 3 mg via INTRAVENOUS
  Filled 2022-08-29: qty 3.75

## 2022-08-29 MED ORDER — ACCU-CHEK GUIDE VI STRP
ORAL_STRIP | 0 refills | Status: AC
  Filled 2022-08-29: qty 100, 30d supply, fill #0

## 2022-08-29 MED ORDER — DIPHENHYDRAMINE HCL 25 MG PO CAPS
50.0000 mg | ORAL_CAPSULE | Freq: Once | ORAL | Status: AC
  Administered 2022-08-29: 50 mg via ORAL
  Filled 2022-08-29: qty 2

## 2022-08-29 MED ORDER — DEXAMETHASONE 4 MG PO TABS: 20.0000 mg | ORAL_TABLET | Freq: Once | ORAL | Status: DC

## 2022-08-29 MED ORDER — ACCU-CHEK SOFTCLIX LANCETS MISC
0 refills | Status: AC
  Filled 2022-08-29: qty 100, 30d supply, fill #0

## 2022-08-29 NOTE — Assessment & Plan Note (Addendum)
#   STAGE I- MULTIPLE MYELOMA -Active [bone lesions; hypercalcemia; anemia; No renal insuffiencey;Bone marrow-32% plasma cell-   STANDARD Cytogenetics].  Currently on Revlimid-Dex- Dara SQ. Partial response noted;  APRIL 2024-M protein = NEG;  K/L RATIO= 1. 6= WNL;  Stable.   # Proceed with Dara SQ today + DEX 20 mg PO [at home/pre-meds; none today- will givendex in clinic.]; Labs today reviewed;  acceptable for treatment today. On Revlimid at 10 mg dose 3 weeks on 1 week off-    Stable.   #Reflux-like symptoms-  Secondary to dexamethasone; on PPI- continue low-dose 4 mg weekly/WED.  and wants to hold off GI evaluation [per daughter]- improved. Stable.   # Anemia-moderate- multifactorial-MM/ IDA-  FEB 2024- Iron sat- 13 Ferritin- 192; continue trial of gentle iron. Hb [JAn 2024]- 9-10  stable. Proceed with venofer today. Will repeat Iron studies;ferritin. Stable.    # Mild swelling in leg/ likely dependant/compression- no concerns of DVT- trial of lasix 20 mg/day.    # Diarrhea- improved on Revlimid 10 mg a day- 3 weeks-ON & 1 week OFF. Stable; refilled lomotil- Stable.  # Diabetes-on oral hypoglycemic agents; on long acting insulin; 14 units of lantus on T/W/Thursday- Defer to PCP. PBF- BG-223 stable.   # labile BP- recent hypotension- but in general HTN: continue losartan; continue coreg; BP- 150-160- refilled coreg; monitor for now.   # Hypercalcemia secondary to multiple myeloma- [FEB 2023-Zometa ; calcitonin]-calcium today is 9.6. Zometa q 39M; stable  # Weight loss-s/p re-veluation with Joli - stable  #DVT/shingles prophylaxis: Aspirin/acyclovir.stable  # Vaccinations; Ok with flu/COVID shot  # IV access:PIV   Shela [facility]- [phone]  zometa-q 3 months [next sep 2024-]- ; MM; K/L q 4 W  # DISPOSITION:  # check BNP today  # proceed with  dara SQ but give dexamethasone as ordered 20 mg/premed; also proceed with venofer;  Zometa  # follow up in 5 weeks/Friday  MD-labs- cbc/cmp;BNP  ; MM panel;K/L light chains;  Dara SQ; possible venofer; Dr.B

## 2022-08-29 NOTE — Progress Notes (Signed)
Pt didn't get her 5 pills to take before treatment.    Pt and daughter state her blood pressure has been dropping over the past 2 weeks causing some light headedness and dizziness. Readings have been with systolic in the 80's and dystolic between 81-19.

## 2022-08-29 NOTE — Addendum Note (Signed)
Addended by: Darrold Span A on: 08/29/2022 01:18 PM   Modules accepted: Orders

## 2022-08-29 NOTE — Progress Notes (Signed)
[facility]cer Center CONSULT NOTE  Patient Care Team: Duanne Limerick, MD as PCP - General (Family Medicine) Lajean Manes, [facility] (Inactive) (Pharmacist) Earna Coder, MD as Consulting Physician (Oncology)  CHIEF COMPLAINTS/PURPOSE OF CONSULTATION: Multiple myeloma  Oncology History Overview Note  # MULTIPLE MYELOMA  [FEB 2023-hypercalcemia status changes]-Active [bone lesions; hypercalcemia; anemia; No renal insuffiencey;Bone marrow-32% plasma cell-   STANDARD Cytogenetics].FEB 2023- S/p dexamethasone 20 mg q day x4.   # REV [15 mg -3 week-On and 1 week-OFF]-DARA; May 2023-discontinued rev 15 diarrhea/hypotension  # MAY 2023- dara-Rev 10 mg 3w/1w   # FEB 2023-hypercalcemia [[facility]-status post calcitonin; bisphosphonate]  BONE MARROW, ASPIRATE, CLOT, CORE:  -Hypercellular bone marrow with plasma cell neoplasm  -See comment   PERIPHERAL BLOOD:  -Normocytic-normochromic anemia   COMMENT:   The bone marrow is hypercellular for age with increased number of  atypical plasma cells representing 32% of all cells in the aspirate  associated with interstitial infiltrates and numerous variably sized  clusters in the clot/biopsy sections.  The plasma cells display weak  kappa light chain restriction consistent with plasma cell neoplasm.  Correlation with cytogenetic and FISH studies is recommended   MICROSCOPIC DESCRIPTION:   PERIPHERAL BLOOD SMEAR: The red blood cells display mild  anisopoikilocytosis with mild polychromasia.  The white blood cells are  normal number with scattered hypogranular neutrophils. An occasional  myelocyte and large atypical mononuclear cells are seen on scan. The  platelets are normal in number.    Multiple myeloma not having achieved remission (HCC)  05/15/2021 Initial Diagnosis   Multiple myeloma not having achieved remission (HCC)   06/27/2021 - 10/17/2021 Chemotherapy   Patient is on Treatment Plan : MYELOMA Daratumumab SQ + Lenalidomide +  Dexamethasone (DaraRd) q28d     06/27/2021 -  Chemotherapy   Patient is on Treatment Plan : MYELOMA RELAPSED REFRACTORY Daratumumab SQ + Lenalidomide + Dexamethasone (DaraRd) q28d     07/11/2021 Cancer Staging   Staging form: Plasma Cell Myeloma and Plasma Cell Disorders, AJCC 8th Edition - Clinical: Beta-2-microglobulin (mg/L): 2.6, Albumin (g/dL): 3.9, ISS: Stage I - Signed by Earna Coder, MD on 07/11/2021 Stage prefix: Initial diagnosis Beta 2 microglobulin range (mg/L): Less than 3.5 Albumin range (g/dL): Greater than or equal to 3.5     HISTORY OF PRESENTING ILLNESS: pt in a wheel chair; accompanied by her daughter.   Cristina Morgan 88 y.o.  female with  multiple myeloma currently on Rev-Dex is proceed with Dara SQ today.    Pt didn't get her 5 pills to take before treatment.     Pt and daughter state her blood pressure has been dropping over the past 2 weeks causing some light headedness and dizziness. Readings have been with systolic in the 80's and dystolic between 16-10. In clinic better. Off losartan. Complains of bilateral leg swelling. No falls.   Chronic mild fatigue.  Otherwise no worsening shortness of breath or cough. Diarrhea- intermittently, not worse.   Denies any nausea vomiting.  Denies any blood in stools or black-colored stools.  Patient states her back pain is improved.  Denies any worsening joint pains.   Review of Systems  Constitutional:  Positive for malaise/fatigue and weight loss. Negative for chills, diaphoresis and fever.  HENT:  Negative for nosebleeds and sore throat.   Eyes:  Negative for double vision.  Respiratory:  Negative for cough, hemoptysis, sputum production, shortness of breath and wheezing.   Cardiovascular:  Negative for chest pain, palpitations, orthopnea  and leg swelling.  Gastrointestinal:  Negative for abdominal pain, blood in stool, constipation, diarrhea, heartburn, melena, nausea and vomiting.  Genitourinary:  Negative for  dysuria, frequency and urgency.  Musculoskeletal:  Positive for back pain and joint pain.  Skin: Negative.  Negative for itching and rash.  Neurological:  Negative for dizziness, tingling, focal weakness, weakness and headaches.  Endo/Heme/Allergies:  Does not bruise/bleed easily.  Psychiatric/Behavioral:  Negative for depression. The patient is not nervous/anxious and does not have insomnia.     MEDICAL HISTORY:  Past Medical History:  Diagnosis Date   Anemia    CAD (coronary artery disease)    Cataracts, bilateral    Closed compression fracture of first lumbar vertebra (HCC)    Closed compression fracture of second lumbar vertebra (HCC)    Diabetes mellitus without complication (HCC)    High cholesterol    History of pneumonia 2010   [place]   History of uterine fibroid    Hypercalcemia    Hyperlipidemia    Hypertension    Lytic bone lesions on xray    Multiple myeloma (HCC)    Osteoporosis    Primary osteoarthritis of right knee     SURGICAL HISTORY: Past Surgical History:  Procedure Laterality Date   CATARACT EXTRACTION Bilateral 2014   COLONOSCOPY  2011   cleared for 5 yrs- [facility]   ECTOPIC PREGNANCY SURGERY      SOCIAL HISTORY: Social History   Socioeconomic History   Marital status: Married    Spouse name: Not on file   Number of children: 2   Years of education: Not on file   Highest education level: 12th grade  Occupational History   Occupation: Retired  Tobacco Use   Smoking status: Never    Passive exposure: Never   Smokeless tobacco: Never   Tobacco comments:    smoking cessation materials not required  Vaping Use   Vaping Use: Never used  Substance and Sexual Activity   Alcohol use: No   Drug use: No   Sexual activity: Not Currently  Other Topics Concern   Not on file  Social History Narrative   [place] with husband; drives. Never smoked; no alcohol. Daughters live close.    Social Determinants of Health   Financial Resource  Strain: Low Risk  (11/13/2021)   Overall Financial Resource Strain (CARDIA)    Difficulty of Paying Living Expenses: Not hard at all  Food Insecurity: No Food Insecurity (11/13/2021)   Hunger Vital Sign    Worried About Running Out of Food in the Last Year: Never true    Ran Out of Food in the Last Year: Never true  Transportation Needs: No Transportation Needs (11/13/2021)   PRAPARE - Administrator, Civil Service (Medical): No    Lack of Transportation (Non-Medical): No  Physical Activity: Sufficiently Active (11/13/2021)   Exercise Vital Sign    Days of Exercise per Week: 7 days    Minutes of Exercise per Session: 30 min  Stress: No Stress Concern Present (11/13/2021)   [employer] of Occupational Health - Occupational Stress Questionnaire    Feeling of Stress : Not at all  Social Connections: Moderately Integrated (11/13/2021)   Social Connection and Isolation Panel [NHANES]    Frequency of Communication with Friends and Family: Twice a week    Frequency of Social Gatherings with Friends and Family: More than three times a week    Attends Religious Services: 1 to 4 times per year  Active Member of Clubs or Organizations: No    Attends Banker Meetings: Never    Marital Status: Married  Catering manager Violence: Not At Risk (11/13/2021)   Humiliation, Afraid, Rape, and Kick questionnaire    Fear of Current or Ex-Partner: No    Emotionally Abused: No    Physically Abused: No    Sexually Abused: No    FAMILY HISTORY: Family History  Problem Relation Age of Onset   Cancer Mother        breast   Heart disease Mother    Diabetes Father    Heart disease Father    Heart attack Father    Heart attack Brother    Diabetes Brother     ALLERGIES:  is allergic to latex and penicillins.  MEDICATIONS:  Current Outpatient Medications  Medication Sig Dispense Refill   Accu-Chek Softclix Lancets lancets Use as instructed 100 each 0   acetaminophen  (TYLENOL) 325 MG tablet Take 325 mg by mouth 2 (two) times daily. START 2 days prior to chemo infusion. Take For 2 days; Do NOT take on the day of infusion     acyclovir (ZOVIRAX) 400 MG tablet TAKE ONE TABLET BY MOUTH TWICE DAILY 60 tablet 4   aspirin EC 81 MG tablet Take 81 mg by mouth daily. Swallow whole.     BD PEN NEEDLE NANO 2ND GEN 32G X 4 MM MISC USE 1  ONCE DAILY 100 each 0   Blood Glucose Monitoring Suppl (ACCU-CHEK GUIDE) w/Device KIT USE AS DIRECTED TO CHECK GLUCOSE     Calcium Carbonate-Vit D-Min (CALCIUM 1200 PO) Take 1 capsule by mouth daily.     carvedilol (COREG) 3.125 MG tablet TAKE ONE TABLET BY MOUTH TWICE DAILY 60 tablet 1   cholecalciferol (VITAMIN D3) 25 MCG (1000 UNIT) tablet Take 1,000 Units by mouth daily.     dexamethasone (DECADRON) 4 MG tablet Take 5 tablets (20 mg total) by mouth as directed. Take one hour before monthly Darzalex injections. 15 tablet 3   diphenoxylate-atropine (LOMOTIL) 2.5-0.025 MG tablet Take 1 tablet by mouth 4 times daily as needed for diarrhea or loose stools. Take along with immodium. 60 tablet 3   furosemide (LASIX) 20 MG tablet Take 1 tablet (20 mg total) by mouth daily. Take it in AM. 7 tablet 0   Glucerna (GLUCERNA) LIQD Take 237 mLs by mouth.     glucose blood (ACCU-CHEK GUIDE) test strip Use as instructed 100 each 0   hydrOXYzine (ATARAX) 10 MG tablet Take 1 tablet (10 mg total) by mouth at bedtime as needed. 90 tablet 1   Infant Foods (GOOD START 2 ESSENTIALS/IRON PO) Take 1 tablet by mouth daily.     insulin glargine (LANTUS SOLOSTAR) 100 UNIT/ML Solostar Pen Inject 10 Units into the skin daily. 6 units every day except Wed and Thursday- 8 units 15 mL 0   lenalidomide (REVLIMID) 10 MG capsule Take 1 capsule (10 mg total) by mouth daily. Take for 21 days, then hold for 7 days. Repeat every 28 days. 21 capsule 0   losartan (COZAAR) 25 MG tablet TAKE ONE TABLET BY MOUTH EVERY EVENING 90 tablet 1   lovastatin (MEVACOR) 40 MG tablet Take 1  tablet (40 mg total) by mouth every evening. 90 tablet 1   metFORMIN (GLUCOPHAGE) 1000 MG tablet Take 1 tablet (1,000 mg total) by mouth 2 (two) times daily. 180 tablet 1   montelukast (SINGULAIR) 10 MG tablet TAKE ONE TABLET BY MOUTH ONCE DAILY,  STARTING TWO DAYS prior TO infusion. take FOR TWO DAYS AND DO not take ON DAY of infusion 20 tablet 1   ondansetron (ZOFRAN) 8 MG tablet One pill every 8 hours as needed for nausea/vomitting. 40 tablet 1   pantoprazole (PROTONIX) 40 MG tablet Take 1 tablet (40 mg total) by mouth daily. 90 tablet 0   prochlorperazine (COMPAZINE) 10 MG tablet Take 1 tablet (10 mg total) by mouth every 6 (six) hours as needed for nausea or vomiting. 40 tablet 1   sertraline (ZOLOFT) 25 MG tablet TAKE ONE TABLET BY MOUTH ONCE DAILY 90 tablet 1   triamcinolone (NASACORT) 55 MCG/ACT AERO nasal inhaler 2 sprays 2 (two) times daily.     Current Facility-Administered Medications  Medication Dose Route Frequency Provider Last Rate Last Admin   dexamethasone (DECADRON) tablet 20 mg  20 mg Oral Once Earna Coder, MD       Facility-Administered Medications Ordered in Other Visits  Medication Dose Route Frequency Provider Last Rate Last Admin   0.9 %  sodium chloride infusion   Intravenous Once Earna Coder, MD       acetaminophen (TYLENOL) tablet 650 mg  650 mg Oral Once Earna Coder, MD       daratumumab-hyaluronidase-fihj (DARZALEX FASPRO) 1800-30000 MG-UT/15ML chemo SQ injection 1,800 mg  1,800 mg Subcutaneous Once Earna Coder, MD       diphenhydrAMINE (BENADRYL) capsule 50 mg  50 mg Oral Once Earna Coder, MD       iron sucrose (VENOFER) 200 mg in sodium chloride 0.9 % 100 mL IVPB  200 mg Intravenous Weekly Louretta Shorten R, MD       zoledronic acid (ZOMETA) 3 mg in sodium chloride 0.9 % 100 mL IVPB  3 mg Intravenous Once Earna Coder, MD        PHYSICAL EXAMINATION:  Vitals:   08/29/22 0901  BP: (!) 154/58   Pulse: 76  Temp: (!) 97 F (36.1 C)  SpO2: 100%    Filed Weights   08/29/22 0901  Weight: 133 lb 12.8 oz (60.7 kg)     Physical Exam Vitals and nursing note reviewed.  HENT:     Head: Normocephalic and atraumatic.     Mouth/Throat:     Pharynx: Oropharynx is clear.  Eyes:     Extraocular Movements: Extraocular movements intact.     Pupils: Pupils are equal, round, and reactive to light.  Cardiovascular:     Rate and Rhythm: Normal rate and regular rhythm.  Pulmonary:     Comments: Decreased breath sounds bilaterally.  Abdominal:     Palpations: Abdomen is soft.  Musculoskeletal:        General: Normal range of motion.     Cervical back: Normal range of motion.  Skin:    General: Skin is warm.  Neurological:     General: No focal deficit present.     Mental Status: She is alert and oriented to person, place, and time.  Psychiatric:        Behavior: Behavior normal.        Judgment: Judgment normal.      LABORATORY DATA:  I have reviewed the data as listed Lab Results  Component Value Date   WBC 3.7 (L) 08/29/2022   HGB 10.0 (L) 08/29/2022   HCT 30.7 (L) 08/29/2022   MCV 102.7 (H) 08/29/2022   PLT 334 08/29/2022   Recent Labs    07/04/22 0935 08/01/22 1311 08/29/22 0905  NA 134* 138 136  K 3.9 3.6 3.6  CL 105 107 104  CO2 23 22 23   GLUCOSE 149* 213* 155*  BUN 19 17 20   CREATININE 1.14* 0.99 0.90  CALCIUM 9.0 9.3 9.7  GFRNONAA 47* 56* >60  PROT 6.2* 5.8* 6.6  ALBUMIN 3.7 3.6 3.7  AST 23 20 18   ALT 19 12 15   ALKPHOS 56 53 60  BILITOT 0.5 0.4 0.6    RADIOGRAPHIC STUDIES: I have personally reviewed the radiological images as listed and agreed with the findings in the report. No results found.  Multiple myeloma not having achieved remission (HCC) # STAGE I- MULTIPLE MYELOMA -Active [bone lesions; hypercalcemia; anemia; No renal insuffiencey;Bone marrow-32% plasma cell-   STANDARD Cytogenetics].  Currently on Revlimid-Dex- Dara SQ. Partial  response noted;  APRIL 2024-M protein = NEG;  K/L RATIO= 1. 6= WNL;  Stable.   # Proceed with Dara SQ today + DEX 20 mg PO [at home/pre-meds; none today- will givendex in clinic.]; Labs today reviewed;  acceptable for treatment today. On Revlimid at 10 mg dose 3 weeks on 1 week off-    Stable.   #Reflux-like symptoms-  Secondary to dexamethasone; on PPI- continue low-dose 4 mg weekly/WED.  and wants to hold off GI evaluation [per daughter]- improved. Stable.   # Anemia-moderate- multifactorial-MM/ IDA-  FEB 2024- Iron sat- 13 Ferritin- 192; continue trial of gentle iron. Hb [JAn 2024]- 9-10  stable. Proceed with venofer today. Will repeat Iron studies;ferritin. Stable.    # Mild swelling in leg/ likely dependant/compression- no concerns of DVT- trial of lasix 20 mg/day.    # Diarrhea- improved on Revlimid 10 mg a day- 3 weeks-ON & 1 week OFF. Stable; refilled lomotil- Stable.  # Diabetes-on oral hypoglycemic agents; on long acting insulin; 14 units of lantus on T/W/Thursday- Defer to PCP. PBF- BG-223 stable.   # labile BP- recent hypotension- but in general HTN: continue losartan; continue coreg; BP- 150-160- refilled coreg; monitor for now.   # Hypercalcemia secondary to multiple myeloma- [FEB 2023-Zometa ; calcitonin]-calcium today is 9.6. Zometa q 27M; stable  # Weight loss-s/p re-veluation with Joli - stable  #DVT/shingles prophylaxis: Aspirin/acyclovir.stable  # Vaccinations; Ok with flu/COVID shot  # IV access:PIV   [facility]- [phone]  zometa-q 3 months [next sep 2024-]- ; MM; K/L q 4 W  # DISPOSITION:  # check BNP today  # proceed with  dara SQ but give dexamethasone as ordered 20 mg/premed; also proceed with venofer;  Zometa  # follow up in 5 weeks/Friday  MD-labs- cbc/cmp;BNP ; MM panel;K/L light chains;  Dara SQ; possible venofer; Dr.B   All questions were answered. The patient knows to call the clinic with any problems, questions or concerns.    Earna Coder, MD 08/29/2022 10:31 AM

## 2022-08-29 NOTE — Patient Instructions (Addendum)
[place] [facility]  Discharge Instructions: Thank you for choosing [facility] to provide your oncology and hematology care.  If you have a lab appointment with the Cancer Center, please go directly to the Cancer Center and check in at the registration area.  Wear comfortable clothing and clothing appropriate for easy access to any Portacath or PICC line.   We strive to give you quality time with your provider. You may need to reschedule your appointment if you arrive late (15 or more minutes).  Arriving late affects you and other patients whose appointments are after yours.  Also, if you miss three or more appointments without notifying the office, you may be dismissed from the clinic at the provider's discretion.      For prescription refill requests, have your pharmacy contact our office and allow 72 hours for refills to be completed.    Today you received the following chemotherapy and/or immunotherapy agents- daratumumab      To help prevent nausea and vomiting after your treatment, we encourage you to take your nausea medication as directed.  BELOW ARE SYMPTOMS THAT SHOULD BE REPORTED IMMEDIATELY: *FEVER GREATER THAN 100.4 F (38 C) OR HIGHER *CHILLS OR SWEATING *NAUSEA AND VOMITING THAT IS NOT CONTROLLED WITH YOUR NAUSEA MEDICATION *UNUSUAL SHORTNESS OF BREATH *UNUSUAL BRUISING OR BLEEDING *URINARY PROBLEMS (pain or burning when urinating, or frequent urination) *BOWEL PROBLEMS (unusual diarrhea, constipation, pain near the anus) TENDERNESS IN MOUTH AND THROAT WITH OR WITHOUT PRESENCE OF ULCERS (sore throat, sores in mouth, or a toothache) UNUSUAL RASH, SWELLING OR PAIN  UNUSUAL VAGINAL DISCHARGE OR ITCHING   Items with * indicate a potential emergency and should be followed up as soon as possible or go to the Emergency Department if any problems should occur.  Please show the CHEMOTHERAPY ALERT CARD or IMMUNOTHERAPY ALERT CARD at check-in  to the Emergency Department and triage nurse.  Should you have questions after your visit or need to cancel or reschedule your appointment, please contact [place] [facility]  [phone] and follow the prompts.  Office hours are 8:00 a.m. to 4:30 p.m. Monday - Friday. Please note that voicemails left after 4:00 p.m. may not be returned until the following business day.  We are closed weekends and major holidays. You have access to a nurse at all times for urgent questions. Please call the main number to the clinic [phone] and follow the prompts.  For any non-urgent questions, you may also contact your provider using MyChart. We now offer e-Visits for anyone 40 and older to request care online for non-urgent symptoms. For details visit [email or web address].   Also download the MyChart app! Go to the app store, search "MyChart", open the app, select [place], and log in with your MyChart username and password.  Iron Sucrose Injection What is this medication? IRON SUCROSE (EYE ern SOO krose) treats low levels of iron (iron deficiency anemia) in people with kidney disease. Iron is a mineral that plays an important role in making red blood cells, which carry oxygen from your lungs to the rest of your body. This medicine may be used for other purposes; ask your health care provider or pharmacist if you have questions. COMMON BRAND NAME(S): Venofer What should I tell my care team before I take this medication? They need to know if you have any of these conditions: Anemia not caused by low iron levels Heart disease High levels of iron in the blood  Kidney disease Liver disease An unusual or allergic reaction to iron, other medications, foods, dyes, or preservatives Pregnant or trying to get pregnant Breastfeeding How should I use this medication? This medication is for infusion into a vein. It is given in a hospital or clinic setting. Talk to your care team  about the use of this medication in children. While this medication may be prescribed for children as young as 2 years for selected conditions, precautions do apply. Overdosage: If you think you have taken too much of this medicine contact a poison control center or emergency room at once. NOTE: This medicine is only for you. Do not share this medicine with others. What if I miss a dose? Keep appointments for follow-up doses. It is important not to miss your dose. Call your care team if you are unable to keep an appointment. What may interact with this medication? Do not take this medication with any of the following: Deferoxamine Dimercaprol Other iron products This medication may also interact with the following: Chloramphenicol Deferasirox This list may not describe all possible interactions. Give your health care provider a list of all the medicines, herbs, non-prescription drugs, or dietary supplements you use. Also tell them if you smoke, drink alcohol, or use illegal drugs. Some items may interact with your medicine. What should I watch for while using this medication? Visit your care team regularly. Tell your care team if your symptoms do not start to get better or if they get worse. You may need blood work done while you are taking this medication. You may need to follow a special diet. Talk to your care team. Foods that contain iron include: whole grains/cereals, dried fruits, beans, or peas, leafy green vegetables, and organ meats (liver, kidney). What side effects may I notice from receiving this medication? Side effects that you should report to your care team as soon as possible: Allergic reactions--skin rash, itching, hives, swelling of the face, lips, tongue, or throat Low blood pressure--dizziness, feeling faint or lightheaded, blurry vision Shortness of breath Side effects that usually do not require medical attention (report to your care team if they continue or are  bothersome): Flushing Headache Joint pain Muscle pain Nausea Pain, redness, or irritation at injection site This list may not describe all possible side effects. Call your doctor for medical advice about side effects. You may report side effects to FDA at 1-800-FDA-1088. Where should I keep my medication? This medication is given in a hospital or clinic and will not be stored at home. NOTE: This sheet is a summary. It may not cover all possible information. If you have questions about this medicine, talk to your doctor, pharmacist, or health care provider.  2024 Elsevier/Gold Standard (2021-09-11 00:00:00)  Zoledronic Acid Injection (Cancer) What is this medication? ZOLEDRONIC ACID (ZOE le dron ik AS id) treats high calcium levels in the blood caused by cancer. It may also be used with chemotherapy to treat weakened bones caused by cancer. It works by slowing down the release of calcium from bones. This lowers calcium levels in your blood. It also makes your bones stronger and less likely to break (fracture). It belongs to a group of medications called bisphosphonates. This medicine may be used for other purposes; ask your health care provider or pharmacist if you have questions. COMMON BRAND NAME(S): Zometa, Zometa Powder What should I tell my care team before I take this medication? They need to know if you have any of these conditions: Dehydration Dental disease  Kidney disease Liver disease Low levels of calcium in the blood Lung or breathing disease, such as asthma Receiving steroids, such as dexamethasone or prednisone An unusual or allergic reaction to zoledronic acid, other medications, foods, dyes, or preservatives Pregnant or trying to get pregnant Breast-feeding How should I use this medication? This medication is injected into a vein. It is given by your care team in a hospital or clinic setting. Talk to your care team about the use of this medication in children. Special  care may be needed. Overdosage: If you think you have taken too much of this medicine contact a poison control center or emergency room at once. NOTE: This medicine is only for you. Do not share this medicine with others. What if I miss a dose? Keep appointments for follow-up doses. It is important not to miss your dose. Call your care team if you are unable to keep an appointment. What may interact with this medication? Certain antibiotics given by injection Diuretics, such as bumetanide, furosemide NSAIDs, medications for pain and inflammation, such as ibuprofen or naproxen Teriparatide Thalidomide This list may not describe all possible interactions. Give your health care provider a list of all the medicines, herbs, non-prescription drugs, or dietary supplements you use. Also tell them if you smoke, drink alcohol, or use illegal drugs. Some items may interact with your medicine. What should I watch for while using this medication? Visit your care team for regular checks on your progress. It may be some time before you see the benefit from this medication. Some people who take this medication have severe bone, joint, or muscle pain. This medication may also increase your risk for jaw problems or a broken thigh bone. Tell your care team right away if you have severe pain in your jaw, bones, joints, or muscles. Tell you care team if you have any pain that does not go away or that gets worse. Tell your dentist and dental surgeon that you are taking this medication. You should not have major dental surgery while on this medication. See your dentist to have a dental exam and fix any dental problems before starting this medication. Take good care of your teeth while on this medication. Make sure you see your dentist for regular follow-up appointments. You should make sure you get enough calcium and vitamin D while you are taking this medication. Discuss the foods you eat and the vitamins you take with  your care team. Check with your care team if you have severe diarrhea, nausea, and vomiting, or if you sweat a lot. The loss of too much body fluid may make it dangerous for you to take this medication. You may need bloodwork while taking this medication. Talk to your care team if you wish to become pregnant or think you might be pregnant. This medication can cause serious birth defects. What side effects may I notice from receiving this medication? Side effects that you should report to your care team as soon as possible: Allergic reactions--skin rash, itching, hives, swelling of the face, lips, tongue, or throat Kidney injury--decrease in the amount of urine, swelling of the ankles, hands, or feet Low calcium level--muscle pain or cramps, confusion, tingling, or numbness in the hands or feet Osteonecrosis of the jaw--pain, swelling, or redness in the mouth, numbness of the jaw, poor healing after dental work, unusual discharge from the mouth, visible bones in the mouth Severe bone, joint, or muscle pain Side effects that usually do not require medical attention (report  to your care team if they continue or are bothersome): Constipation Fatigue Fever Loss of appetite Nausea Stomach pain This list may not describe all possible side effects. Call your doctor for medical advice about side effects. You may report side effects to FDA at 1-800-FDA-1088. Where should I keep my medication? This medication is given in a hospital or clinic. It will not be stored at home. NOTE: This sheet is a summary. It may not cover all possible information. If you have questions about this medicine, talk to your doctor, pharmacist, or health care provider.  2024 Elsevier/Gold Standard (2021-04-26 00:00:00)

## 2022-09-01 ENCOUNTER — Other Ambulatory Visit: Payer: Self-pay

## 2022-09-01 ENCOUNTER — Encounter: Payer: Self-pay | Admitting: Pharmacist

## 2022-09-01 LAB — KAPPA/LAMBDA LIGHT CHAINS
Kappa free light chain: 13.9 mg/L (ref 3.3–19.4)
Kappa, lambda light chain ratio: 0.99 (ref 0.26–1.65)
Lambda free light chains: 14.1 mg/L (ref 5.7–26.3)

## 2022-09-03 LAB — MULTIPLE MYELOMA PANEL, SERUM
Albumin SerPl Elph-Mcnc: 3.7 g/dL (ref 2.9–4.4)
Albumin/Glob SerPl: 1.8 — ABNORMAL HIGH (ref 0.7–1.7)
Alpha 1: 0.2 g/dL (ref 0.0–0.4)
Alpha2 Glob SerPl Elph-Mcnc: 0.6 g/dL (ref 0.4–1.0)
B-Globulin SerPl Elph-Mcnc: 0.8 g/dL (ref 0.7–1.3)
Gamma Glob SerPl Elph-Mcnc: 0.5 g/dL (ref 0.4–1.8)
Globulin, Total: 2.1 g/dL — ABNORMAL LOW (ref 2.2–3.9)
IgA: 74 mg/dL (ref 64–422)
IgG (Immunoglobin G), Serum: 616 mg/dL (ref 586–1602)
IgM (Immunoglobulin M), Srm: 28 mg/dL (ref 26–217)
M Protein SerPl Elph-Mcnc: 0.1 g/dL — ABNORMAL HIGH
Total Protein ELP: 5.8 g/dL — ABNORMAL LOW (ref 6.0–8.5)

## 2022-09-04 ENCOUNTER — Other Ambulatory Visit: Payer: Self-pay

## 2022-09-09 ENCOUNTER — Other Ambulatory Visit: Payer: Self-pay | Admitting: Internal Medicine

## 2022-09-09 ENCOUNTER — Encounter: Payer: Self-pay | Admitting: Family Medicine

## 2022-09-09 ENCOUNTER — Ambulatory Visit (INDEPENDENT_AMBULATORY_CARE_PROVIDER_SITE_OTHER): Payer: Medicare Other | Admitting: Family Medicine

## 2022-09-09 VITALS — BP 132/74 | HR 64 | Ht 66.0 in | Wt 132.0 lb

## 2022-09-09 DIAGNOSIS — E119 Type 2 diabetes mellitus without complications: Secondary | ICD-10-CM

## 2022-09-09 DIAGNOSIS — R600 Localized edema: Secondary | ICD-10-CM | POA: Diagnosis not present

## 2022-09-09 DIAGNOSIS — I10 Essential (primary) hypertension: Secondary | ICD-10-CM

## 2022-09-09 DIAGNOSIS — Z7984 Long term (current) use of oral hypoglycemic drugs: Secondary | ICD-10-CM | POA: Diagnosis not present

## 2022-09-09 DIAGNOSIS — Z794 Long term (current) use of insulin: Secondary | ICD-10-CM | POA: Diagnosis not present

## 2022-09-09 MED ORDER — FUROSEMIDE 20 MG PO TABS: 20.0000 mg | ORAL_TABLET | Freq: Every day | ORAL | 0 refills | Status: DC

## 2022-09-09 MED ORDER — METFORMIN HCL 1000 MG PO TABS: 1000.0000 mg | ORAL_TABLET | Freq: Two times a day (BID) | ORAL | 1 refills | Status: DC

## 2022-09-09 MED ORDER — LANTUS SOLOSTAR 100 UNIT/ML ~~LOC~~ SOPN: 10.0000 [IU] | PEN_INJECTOR | Freq: Every day | SUBCUTANEOUS | 0 refills | Status: DC

## 2022-09-09 NOTE — Progress Notes (Signed)
Date:  09/09/2022   Name:  Cristina Morgan   DOB:  01-Apr-1934   MRN:  [id number]   Chief Complaint: Diabetes Shela Commons is in room- okay per pt) and Edema (Lower extremities)  Diabetes She presents for her follow-up diabetic visit. She has type 2 diabetes mellitus. Her disease course has been stable. There are no hypoglycemic associated symptoms. There are no diabetic associated symptoms. Pertinent negatives for diabetes include no polydipsia, no polyphagia and no polyuria. There are no hypoglycemic complications. Symptoms are stable. There are no diabetic complications.    Lab Results  Component Value Date   NA 136 08/29/2022   K 3.6 08/29/2022   CO2 23 08/29/2022   GLUCOSE 155 (H) 08/29/2022   BUN 20 08/29/2022   CREATININE 0.90 08/29/2022   CALCIUM 9.7 08/29/2022   EGFR 68 02/01/2021   GFRNONAA >60 08/29/2022   Lab Results  Component Value Date   CHOL 146 05/09/2022   HDL 67 05/09/2022   LDLCALC 74 05/09/2022   TRIG 26 05/09/2022   CHOLHDL 2.2 05/09/2022   Lab Results  Component Value Date   TSH 0.367 05/03/2021   Lab Results  Component Value Date   HGBA1C 7.2 (H) 05/09/2022   Lab Results  Component Value Date   WBC 3.7 (L) 08/29/2022   HGB 10.0 (L) 08/29/2022   HCT 30.7 (L) 08/29/2022   MCV 102.7 (H) 08/29/2022   PLT 334 08/29/2022   Lab Results  Component Value Date   ALT 15 08/29/2022   AST 18 08/29/2022   ALKPHOS 60 08/29/2022   BILITOT 0.6 08/29/2022   No results found for: "25OHVITD2", "25OHVITD3", "VD25OH"   Review of Systems  Constitutional:  Negative for chills, diaphoresis and fever.  HENT:  Negative for congestion, sinus pressure and sore throat.   Respiratory:  Negative for shortness of breath and wheezing.   Cardiovascular:  Negative for palpitations.  Gastrointestinal:  Negative for abdominal pain.  Endocrine: Negative for polydipsia, polyphagia and polyuria.  Genitourinary:  Negative for difficulty urinating and vaginal bleeding.     Patient Active Problem List   Diagnosis Date Noted   Multiple myeloma not having achieved remission (HCC) 05/15/2021   Pressure injury of coccygeal region, stage 2 (HCC) 05/07/2021   Hyponatremia 05/04/2021   Pressure injury of skin 05/02/2021   Vitamin B12 deficiency 05/02/2021   Hypomagnesemia 05/02/2021   Hypophosphatemia 05/02/2021   SVT (supraventricular tachycardia) 05/02/2021   Altered mental status 05/01/2021   Acute metabolic encephalopathy 05/01/2021   Hypercalcemia 05/01/2021   Closed compression fracture of second lumbar vertebra (HCC) 10/28/2020   Need for vaccination against Streptococcus pneumoniae using pneumococcal conjugate vaccine 13 01/07/2017   Primary osteoarthritis of right knee 12/08/2016   PNA (pneumonia) 07/09/2015   Acute chest pain 03/20/2014   Type 2 diabetes mellitus (HCC) 03/20/2014   Hypercholesterolemia 03/20/2014   Essential hypertension 03/20/2014    Allergies  Allergen Reactions   Latex Rash   Penicillins Other (See Comments)    Past Surgical History:  Procedure Laterality Date   CATARACT EXTRACTION Bilateral 2014   COLONOSCOPY  2011   cleared for 5 yrs- [facility]   ECTOPIC PREGNANCY SURGERY      Social History   Tobacco Use   Smoking status: Never    Passive exposure: Never   Smokeless tobacco: Never   Tobacco comments:    smoking cessation materials not required  Vaping Use   Vaping Use: Never used  Substance Use Topics   Alcohol  use: No   Drug use: No     Medication list has been reviewed and updated.  Current Meds  Medication Sig   Accu-Chek Softclix Lancets lancets Use as instructed   acetaminophen (TYLENOL) 325 MG tablet Take 325 mg by mouth 2 (two) times daily. START 2 days prior to chemo infusion. Take For 2 days; Do NOT take on the day of infusion   acyclovir (ZOVIRAX) 400 MG tablet TAKE ONE TABLET BY MOUTH TWICE DAILY   aspirin EC 81 MG tablet Take 81 mg by mouth daily. Swallow whole.   BD PEN NEEDLE NANO 2ND  GEN 32G X 4 MM MISC USE 1  ONCE DAILY   Blood Glucose Monitoring Suppl (ACCU-CHEK GUIDE) w/Device KIT USE AS DIRECTED TO CHECK GLUCOSE   Calcium Carbonate-Vit D-Min (CALCIUM 1200 PO) Take 1 capsule by mouth daily.   carvedilol (COREG) 3.125 MG tablet TAKE ONE TABLET BY MOUTH TWICE DAILY   cholecalciferol (VITAMIN D3) 25 MCG (1000 UNIT) tablet Take 1,000 Units by mouth daily.   dexamethasone (DECADRON) 4 MG tablet Take 5 tablets (20 mg total) by mouth as directed. Take one hour before monthly Darzalex injections.   diphenoxylate-atropine (LOMOTIL) 2.5-0.025 MG tablet Take 1 tablet by mouth 4 times daily as needed for diarrhea or loose stools. Take along with immodium.   Glucerna (GLUCERNA) LIQD Take 237 mLs by mouth.   glucose blood (ACCU-CHEK GUIDE) test strip Use as instructed   hydrOXYzine (ATARAX) 10 MG tablet Take 1 tablet (10 mg total) by mouth at bedtime as needed.   Infant Foods (GOOD START 2 ESSENTIALS/IRON PO) Take 1 tablet by mouth daily.   insulin glargine (LANTUS SOLOSTAR) 100 UNIT/ML Solostar Pen Inject 10 Units into the skin daily. 6 units every day except Wed and Thursday- 8 units   lenalidomide (REVLIMID) 10 MG capsule Take 1 capsule (10 mg total) by mouth daily. Take for 21 days, then hold for 7 days. Repeat every 28 days.   losartan (COZAAR) 25 MG tablet TAKE ONE TABLET BY MOUTH EVERY EVENING   lovastatin (MEVACOR) 40 MG tablet Take 1 tablet (40 mg total) by mouth every evening.   metFORMIN (GLUCOPHAGE) 1000 MG tablet Take 1 tablet (1,000 mg total) by mouth 2 (two) times daily.   montelukast (SINGULAIR) 10 MG tablet TAKE ONE TABLET BY MOUTH ONCE DAILY, STARTING TWO DAYS prior TO infusion. take FOR TWO DAYS AND DO not take ON DAY of infusion   ondansetron (ZOFRAN) 8 MG tablet One pill every 8 hours as needed for nausea/vomitting.   pantoprazole (PROTONIX) 40 MG tablet Take 1 tablet (40 mg total) by mouth daily.   prochlorperazine (COMPAZINE) 10 MG tablet Take 1 tablet (10 mg  total) by mouth every 6 (six) hours as needed for nausea or vomiting.   sertraline (ZOLOFT) 25 MG tablet TAKE ONE TABLET BY MOUTH ONCE DAILY   triamcinolone (NASACORT) 55 MCG/ACT AERO nasal inhaler 2 sprays 2 (two) times daily.       09/09/2022    2:40 PM 05/02/2022    2:10 PM 04/21/2022    3:59 PM 12/30/2021    2:39 PM  GAD 7 : Generalized Anxiety Score  Nervous, Anxious, on Edge 0 0 0 0  Control/stop worrying 0 0 0 0  Worry too much - different things 0 0 0 0  Trouble relaxing 0 0 0 0  Restless 0 0 0 0  Easily annoyed or irritable 0 0 0 0  Afraid - awful might happen 0 0  0 0  Total GAD 7 Score 0 0 0 0  Anxiety Difficulty Not difficult at all Not difficult at all Not difficult at all Not difficult at all       09/09/2022    2:39 PM 05/02/2022    2:09 PM 04/21/2022    3:59 PM  Depression screen PHQ 2/9  Decreased Interest 0 0 0  Down, Depressed, Hopeless 0 0 0  PHQ - 2 Score 0 0 0  Altered sleeping 0 0 0  Tired, decreased energy 0 0 0  Change in appetite 0 0 0  Feeling bad or failure about yourself  0 0 0  Trouble concentrating 0 0 0  Moving slowly or fidgety/restless 0 0 0  Suicidal thoughts 0 0 0  PHQ-9 Score 0 0 0  Difficult doing work/chores Not difficult at all Not difficult at all Not difficult at all    BP Readings from Last 3 Encounters:  09/09/22 132/74  08/29/22 (!) 154/58  08/01/22 (!) 159/65    Physical Exam Vitals and nursing note reviewed. Exam conducted with a chaperone present.  Constitutional:      General: She is not in acute distress.    Appearance: She is not diaphoretic.  HENT:     Head: Normocephalic and atraumatic.     Right Ear: Tympanic membrane and external ear normal.     Left Ear: Tympanic membrane and external ear normal.     Nose: Nose normal.     Mouth/Throat:     Mouth: Mucous membranes are moist.  Eyes:     General:        Right eye: No discharge.        Left eye: No discharge.     Conjunctiva/sclera: Conjunctivae normal.      Pupils: Pupils are equal, round, and reactive to light.  Neck:     Thyroid: No thyromegaly.     Vascular: No JVD.  Cardiovascular:     Rate and Rhythm: Normal rate and regular rhythm.     Heart sounds: Normal heart sounds. No murmur heard.    No friction rub. No gallop.  Pulmonary:     Effort: Pulmonary effort is normal.     Breath sounds: Normal breath sounds. No wheezing or rhonchi.  Abdominal:     General: Bowel sounds are normal.     Palpations: Abdomen is soft. There is no mass.     Tenderness: There is no abdominal tenderness. There is no guarding.  Musculoskeletal:        General: Normal range of motion.     Cervical back: Normal range of motion and neck supple.  Lymphadenopathy:     Cervical: No cervical adenopathy.  Skin:    General: Skin is warm and dry.  Neurological:     Mental Status: She is alert.     Deep Tendon Reflexes: Reflexes are normal and symmetric.     Wt Readings from Last 3 Encounters:  09/09/22 132 lb (59.9 kg)  08/29/22 133 lb 12.8 oz (60.7 kg)  08/01/22 129 lb 8 oz (58.7 kg)    BP 132/74   Pulse 64   Ht 5\' 6"  (1.676 m)   Wt 132 lb (59.9 kg)   SpO2 99%   BMI 21.31 kg/m   Assessment and Plan: 1. Type 2 diabetes mellitus without complication, with long-term current use of insulin (HCC) Chronic.  Controlled.  Stable.  Asymptomatic.  Tolerating medication regimen well.  Continue Lantus 10 units daily and  additional Lantus pending whether it is Wednesday Thursday or other days.  Patient will also continue metformin 1 g twice a day.  Will check A1c and microalbuminuria. - insulin glargine (LANTUS SOLOSTAR) 100 UNIT/ML Solostar Pen; Inject 5 units every day.  Foot exam was done and it was unremarkable.  Microalbuminuria/creatinine urine ratio obtained to evaluate for diabetic nephropathy.  Dispense: 15 mL; Refill: 0 - metFORMIN (GLUCOPHAGE) 1000 MG tablet; Take 1 tablet (1,000 mg total) by mouth 2 (two) times daily.  Dispense: 180 tablet; Refill: 1 -  HgB A1c - Microalbumin / creatinine urine ratio  2. Diabetes mellitus treated with oral medication (HCC) As noted above. - insulin glargine (LANTUS SOLOSTAR) 100 UNIT/ML Solostar Pen; Inject 5 units every day.  Dispense: 15 mL; Refill: 0 - metFORMIN (GLUCOPHAGE) 1000 MG tablet; Take 1 tablet (1,000 mg total) by mouth 2 (two) times daily.  Dispense: 180 tablet; Refill: 1 - HgB A1c - Microalbumin / creatinine urine ratio  3. Localized edema Chronic.  Controlled.  Stable.  On as-needed basis Lasix 20 mg as needed and swelling. - furosemide (LASIX) 20 MG tablet; Take 1 tablet (20 mg total) by mouth daily. Take it in AM.  Dispense: 30 tablet; Refill: 0     Elizabeth Sauer, MD

## 2022-09-10 ENCOUNTER — Other Ambulatory Visit: Payer: Self-pay

## 2022-09-10 LAB — MICROALBUMIN / CREATININE URINE RATIO
Creatinine, Urine: 122.3 mg/dL
Microalb/Creat Ratio: 100 mg/g creat — ABNORMAL HIGH (ref 0–29)
Microalbumin, Urine: 122 ug/mL

## 2022-09-10 LAB — HEMOGLOBIN A1C
Est. average glucose Bld gHb Est-mCnc: 174 mg/dL
Hgb A1c MFr Bld: 7.7 % — ABNORMAL HIGH (ref 4.8–5.6)

## 2022-09-11 ENCOUNTER — Other Ambulatory Visit: Payer: Self-pay

## 2022-09-11 DIAGNOSIS — I10 Essential (primary) hypertension: Secondary | ICD-10-CM

## 2022-09-11 DIAGNOSIS — E119 Type 2 diabetes mellitus without complications: Secondary | ICD-10-CM

## 2022-09-11 MED ORDER — LOSARTAN POTASSIUM 50 MG PO TABS
50.0000 mg | ORAL_TABLET | Freq: Every evening | ORAL | 0 refills | Status: DC
Start: 2022-09-11 — End: 2023-05-08

## 2022-09-15 ENCOUNTER — Other Ambulatory Visit: Payer: Self-pay | Admitting: Family Medicine

## 2022-09-15 DIAGNOSIS — K219 Gastro-esophageal reflux disease without esophagitis: Secondary | ICD-10-CM

## 2022-09-25 ENCOUNTER — Encounter: Payer: Self-pay | Admitting: Internal Medicine

## 2022-10-02 ENCOUNTER — Other Ambulatory Visit: Payer: Self-pay | Admitting: *Deleted

## 2022-10-02 DIAGNOSIS — C9000 Multiple myeloma not having achieved remission: Secondary | ICD-10-CM

## 2022-10-02 DIAGNOSIS — I509 Heart failure, unspecified: Secondary | ICD-10-CM

## 2022-10-03 ENCOUNTER — Inpatient Hospital Stay: Payer: Medicare Other | Attending: Nurse Practitioner

## 2022-10-03 ENCOUNTER — Inpatient Hospital Stay: Payer: Medicare Other

## 2022-10-03 ENCOUNTER — Inpatient Hospital Stay (HOSPITAL_BASED_OUTPATIENT_CLINIC_OR_DEPARTMENT_OTHER): Payer: Medicare Other | Admitting: Internal Medicine

## 2022-10-03 ENCOUNTER — Encounter: Payer: Self-pay | Admitting: Internal Medicine

## 2022-10-03 VITALS — BP 130/61 | HR 81 | Temp 97.1°F | Ht 66.0 in | Wt 136.1 lb

## 2022-10-03 DIAGNOSIS — D509 Iron deficiency anemia, unspecified: Secondary | ICD-10-CM | POA: Diagnosis not present

## 2022-10-03 DIAGNOSIS — Z7984 Long term (current) use of oral hypoglycemic drugs: Secondary | ICD-10-CM | POA: Insufficient documentation

## 2022-10-03 DIAGNOSIS — Z79899 Other long term (current) drug therapy: Secondary | ICD-10-CM | POA: Insufficient documentation

## 2022-10-03 DIAGNOSIS — E119 Type 2 diabetes mellitus without complications: Secondary | ICD-10-CM | POA: Diagnosis not present

## 2022-10-03 DIAGNOSIS — R600 Localized edema: Secondary | ICD-10-CM | POA: Insufficient documentation

## 2022-10-03 DIAGNOSIS — R5383 Other fatigue: Secondary | ICD-10-CM | POA: Diagnosis not present

## 2022-10-03 DIAGNOSIS — I509 Heart failure, unspecified: Secondary | ICD-10-CM

## 2022-10-03 DIAGNOSIS — Z5112 Encounter for antineoplastic immunotherapy: Secondary | ICD-10-CM | POA: Insufficient documentation

## 2022-10-03 DIAGNOSIS — C9000 Multiple myeloma not having achieved remission: Secondary | ICD-10-CM | POA: Diagnosis not present

## 2022-10-03 DIAGNOSIS — R634 Abnormal weight loss: Secondary | ICD-10-CM | POA: Diagnosis not present

## 2022-10-03 DIAGNOSIS — I10 Essential (primary) hypertension: Secondary | ICD-10-CM | POA: Diagnosis not present

## 2022-10-03 DIAGNOSIS — Z7961 Long term (current) use of immunomodulator: Secondary | ICD-10-CM | POA: Insufficient documentation

## 2022-10-03 DIAGNOSIS — R197 Diarrhea, unspecified: Secondary | ICD-10-CM | POA: Diagnosis not present

## 2022-10-03 LAB — CBC WITH DIFFERENTIAL/PLATELET
Abs Immature Granulocytes: 0.03 10*3/uL (ref 0.00–0.07)
Basophils Absolute: 0.1 10*3/uL (ref 0.0–0.1)
Basophils Relative: 2 %
Eosinophils Absolute: 0 10*3/uL (ref 0.0–0.5)
Eosinophils Relative: 1 %
HCT: 33.1 % — ABNORMAL LOW (ref 36.0–46.0)
Hemoglobin: 10.8 g/dL — ABNORMAL LOW (ref 12.0–15.0)
Immature Granulocytes: 1 %
Lymphocytes Relative: 17 %
Lymphs Abs: 1 10*3/uL (ref 0.7–4.0)
MCH: 33.4 pg (ref 26.0–34.0)
MCHC: 32.6 g/dL (ref 30.0–36.0)
MCV: 102.5 fL — ABNORMAL HIGH (ref 80.0–100.0)
Monocytes Absolute: 0.2 10*3/uL (ref 0.1–1.0)
Monocytes Relative: 4 %
Neutro Abs: 4.6 10*3/uL (ref 1.7–7.7)
Neutrophils Relative %: 75 %
Platelets: 241 10*3/uL (ref 150–400)
RBC: 3.23 MIL/uL — ABNORMAL LOW (ref 3.87–5.11)
RDW: 14.5 % (ref 11.5–15.5)
WBC: 6 10*3/uL (ref 4.0–10.5)
nRBC: 0 % (ref 0.0–0.2)

## 2022-10-03 LAB — CMP (CANCER CENTER ONLY)
ALT: 15 U/L (ref 10–47)
AST: 20 U/L (ref 11–38)
Albumin: 4.2 g/dL (ref 3.5–5.0)
Alkaline Phosphatase: 59 U/L (ref 38–126)
Anion gap: 10 (ref 5–15)
BUN: 16 mg/dL (ref 8–23)
CO2: 22 mmol/L (ref 22–32)
Calcium: 9.2 mg/dL (ref 8.9–10.3)
Chloride: 105 mmol/L (ref 98–111)
Creatinine: 1.06 mg/dL (ref 0.60–1.20)
Glucose, Bld: 237 mg/dL — ABNORMAL HIGH (ref 70–99)
Potassium: 3.5 mmol/L (ref 3.5–5.1)
Sodium: 137 mmol/L (ref 135–145)
Total Bilirubin: 0.6 mg/dL (ref 0.2–1.6)
Total Protein: 6.8 g/dL (ref 6.5–8.1)

## 2022-10-03 LAB — BRAIN NATRIURETIC PEPTIDE: B Natriuretic Peptide: 165.5 pg/mL — ABNORMAL HIGH (ref 0.0–100.0)

## 2022-10-03 MED ORDER — ACETAMINOPHEN 325 MG PO TABS: 650.0000 mg | ORAL_TABLET | Freq: Once | ORAL | Status: DC

## 2022-10-03 MED ORDER — DARATUMUMAB-HYALURONIDASE-FIHJ 1800-30000 MG-UT/15ML ~~LOC~~ SOLN
1800.0000 mg | Freq: Once | SUBCUTANEOUS | Status: AC
  Administered 2022-10-03: 1800 mg via SUBCUTANEOUS
  Filled 2022-10-03: qty 15

## 2022-10-03 MED ORDER — DIPHENHYDRAMINE HCL 25 MG PO CAPS: 50.0000 mg | ORAL_CAPSULE | Freq: Once | ORAL | Status: DC

## 2022-10-03 NOTE — Progress Notes (Signed)
No concerns today 

## 2022-10-03 NOTE — Assessment & Plan Note (Signed)
#   STAGE I- MULTIPLE MYELOMA -Active [bone lesions; hypercalcemia; anemia; No renal insuffiencey;Bone marrow-32% plasma cell-   STANDARD Cytogenetics].  Currently on Revlimid-Dex- Dara SQ. Partial response noted;  APRIL 2024-M protein = NEG;  K/L RATIO= 1. 6= WNL;  Stable.   # Proceed with Dara SQ today + DEX 20 mg PO [at home/pre-meds; none today- will givendex in clinic.]; Labs today reviewed;  acceptable for treatment today. On Revlimid at 10 mg dose 3 weeks on 1 week off-    Stable.    #Reflux-like symptoms-  Secondary to dexamethasone; on PPI- continue low-dose 4 mg weekly/WED.  and wants to hold off GI evaluation [per daughter]- improved. Stable.   # Anemia-moderate- multifactorial-MM/ IDA-  continue trial of gentle iron. Hb [JAn 2024]- 9-10  stable. JUNE 2024- iron sat-12%- stable. Continue venofer.   # Mild swelling in leg/ likely dependant/compression- no concerns of DVT- trial of lasix 20 mg/day.    # Diarrhea- improved on Revlimid 10 mg a day- 3 weeks-ON & 1 week OFF. Stable; refilled lomotil- stable.   # Diabetes-on oral hypoglycemic agents; on long acting insulin; 14 units of lantus on T/W/Thursday- Defer to PCP. PBF- BG-237 stable.   # labile BP- recent hypotension- but in general HTN: continue losartan; continue coreg; BP- 150-160- refilled coreg; monitor for now. stable.   # Hypercalcemia secondary to multiple myeloma- [FEB 2023-Zometa ; calcitonin]-calcium today is 9.6. Zometa q 92M- stable.  # Weight loss-s/p re-veluation with Joli -stable.  #DVT/shingles prophylaxis: Aspirin/acyclovir.stable  # Vaccinations; Ok with flu/COVID shot  # IV access:PIV   Shela Commons- [phone]  zometa-q 3 months [next sep 2024-]- ; MM; K/L q 4 W  # DISPOSITION:  # proceed with  dara SQ; also proceed with venofer-  # follow up in 4 weeks/Friday  MD-labs- cbc/cmp;BNP ; MM panel;K/L light chains;  Dara SQ; possible venofer; Dr.B

## 2022-10-03 NOTE — Progress Notes (Signed)
[facility] CONSULT NOTE  Patient Care Team: Duanne Limerick, MD as PCP - General (Family Medicine) Lajean Manes, [facility] (Inactive) (Pharmacist) Earna Coder, MD as Consulting Physician (Oncology)  CHIEF COMPLAINTS/PURPOSE OF CONSULTATION: Multiple myeloma  Oncology History Overview Note  # MULTIPLE MYELOMA  [FEB 2023-hypercalcemia status changes]-Active [bone lesions; hypercalcemia; anemia; No renal insuffiencey;Bone marrow-32% plasma cell-   STANDARD Cytogenetics].FEB 2023- S/p dexamethasone 20 mg q day x4.   # REV [15 mg -3 week-On and 1 week-OFF]-DARA; May 2023-discontinued rev 15 diarrhea/hypotension  # MAY 2023- dara-Rev 10 mg 3w/1w   # FEB 2023-hypercalcemia [[facility]-status post calcitonin; bisphosphonate]  BONE MARROW, ASPIRATE, CLOT, CORE:  -Hypercellular bone marrow with plasma cell neoplasm  -See comment   PERIPHERAL BLOOD:  -Normocytic-normochromic anemia   COMMENT:   The bone marrow is hypercellular for age with increased number of  atypical plasma cells representing 32% of all cells in the aspirate  associated with interstitial infiltrates and numerous variably sized  clusters in the clot/biopsy sections.  The plasma cells display weak  kappa light chain restriction consistent with plasma cell neoplasm.  Correlation with cytogenetic and FISH studies is recommended   MICROSCOPIC DESCRIPTION:   PERIPHERAL BLOOD SMEAR: The red blood cells display mild  anisopoikilocytosis with mild polychromasia.  The white blood cells are  normal number with scattered hypogranular neutrophils. An occasional  myelocyte and large atypical mononuclear cells are seen on scan. The  platelets are normal in number.    Multiple myeloma not having achieved remission (HCC)  05/15/2021 Initial Diagnosis   Multiple myeloma not having achieved remission (HCC)   06/27/2021 - 10/17/2021 Chemotherapy   Patient is on Treatment Plan : MYELOMA Daratumumab SQ + Lenalidomide +  Dexamethasone (DaraRd) q28d     06/27/2021 -  Chemotherapy   Patient is on Treatment Plan : MYELOMA RELAPSED REFRACTORY Daratumumab SQ + Lenalidomide + Dexamethasone (DaraRd) q28d     07/11/2021 Cancer Staging   Staging form: Plasma Cell Myeloma and Plasma Cell Disorders, AJCC 8th Edition - Clinical: Beta-2-microglobulin (mg/L): 2.6, Albumin (g/dL): 3.9, ISS: Stage I - Signed by Earna Coder, MD on 07/11/2021 Stage prefix: Initial diagnosis Beta 2 microglobulin range (mg/L): Less than 3.5 Albumin range (g/dL): Greater than or equal to 3.5     HISTORY OF PRESENTING ILLNESS: pt in a wheel chair; accompanied by her daughter.   Cristina Morgan 88 y.o.  female with  multiple myeloma currently on Rev-Dex is proceed with Dara SQ today.     Chronic mild fatigue.  Otherwise no worsening shortness of breath or cough. Diarrhea- intermittently, not worse.   Denies any nausea vomiting.  Denies any blood in stools or black-colored stools.  Patient states her back pain is improved.  Denies any worsening joint pains.   Review of Systems  Constitutional:  Positive for malaise/fatigue and weight loss. Negative for chills, diaphoresis and fever.  HENT:  Negative for nosebleeds and sore throat.   Eyes:  Negative for double vision.  Respiratory:  Negative for cough, hemoptysis, sputum production, shortness of breath and wheezing.   Cardiovascular:  Negative for chest pain, palpitations, orthopnea and leg swelling.  Gastrointestinal:  Negative for abdominal pain, blood in stool, constipation, diarrhea, heartburn, melena, nausea and vomiting.  Genitourinary:  Negative for dysuria, frequency and urgency.  Musculoskeletal:  Positive for back pain and joint pain.  Skin: Negative.  Negative for itching and rash.  Neurological:  Negative for dizziness, tingling, focal weakness, weakness and headaches.  Endo/Heme/Allergies:  Does not bruise/bleed easily.  Psychiatric/Behavioral:  Negative for depression.  The patient is not nervous/anxious and does not have insomnia.     MEDICAL HISTORY:  Past Medical History:  Diagnosis Date   Anemia    CAD (coronary artery disease)    Cataracts, bilateral    Closed compression fracture of first lumbar vertebra (HCC)    Closed compression fracture of second lumbar vertebra (HCC)    Diabetes mellitus without complication (HCC)    High cholesterol    History of pneumonia 2010   [place]   History of uterine fibroid    Hypercalcemia    Hyperlipidemia    Hypertension    Lytic bone lesions on xray    Multiple myeloma (HCC)    Osteoporosis    Primary osteoarthritis of right knee     SURGICAL HISTORY: Past Surgical History:  Procedure Laterality Date   CATARACT EXTRACTION Bilateral 2014   COLONOSCOPY  2011   cleared for 5 yrs- [facility]   ECTOPIC PREGNANCY SURGERY      SOCIAL HISTORY: Social History   Socioeconomic History   Marital status: Married    Spouse name: Not on file   Number of children: 2   Years of education: Not on file   Highest education level: 12th grade  Occupational History   Occupation: Retired  Tobacco Use   Smoking status: Never    Passive exposure: Never   Smokeless tobacco: Never   Tobacco comments:    smoking cessation materials not required  Vaping Use   Vaping status: Never Used  Substance and Sexual Activity   Alcohol use: No   Drug use: No   Sexual activity: Not Currently  Other Topics Concern   Not on file  Social History Narrative   [place] with husband; drives. Never smoked; no alcohol. Daughters live close.    Social Determinants of Health   Financial Resource Strain: Low Risk  (11/13/2021)   Overall Financial Resource Strain (CARDIA)    Difficulty of Paying Living Expenses: Not hard at all  Food Insecurity: No Food Insecurity (11/13/2021)   Hunger Vital Sign    Worried About Running Out of Food in the Last Year: Never true    Ran Out of Food in the Last Year: Never true   Transportation Needs: No Transportation Needs (11/13/2021)   PRAPARE - Administrator, Civil Service (Medical): No    Lack of Transportation (Non-Medical): No  Physical Activity: Sufficiently Active (11/13/2021)   Exercise Vital Sign    Days of Exercise per Week: 7 days    Minutes of Exercise per Session: 30 min  Stress: No Stress Concern Present (11/13/2021)   [employer] of Occupational Health - Occupational Stress Questionnaire    Feeling of Stress : Not at all  Social Connections: Moderately Integrated (11/13/2021)   Social Connection and Isolation Panel [NHANES]    Frequency of Communication with Friends and Family: Twice a week    Frequency of Social Gatherings with Friends and Family: More than three times a week    Attends Religious Services: 1 to 4 times per year    Active Member of [employer] or Organizations: No    Attends Banker Meetings: Never    Marital Status: Married  Catering manager Violence: Not At Risk (11/13/2021)   Humiliation, Afraid, Rape, and Kick questionnaire    Fear of Current or Ex-Partner: No    Emotionally Abused: No    Physically Abused: No  Sexually Abused: No    FAMILY HISTORY: Family History  Problem Relation Age of Onset   Cancer Mother        breast   Heart disease Mother    Diabetes Father    Heart disease Father    Heart attack Father    Heart attack Brother    Diabetes Brother     ALLERGIES:  is allergic to latex and penicillins.  MEDICATIONS:  Current Outpatient Medications  Medication Sig Dispense Refill   Accu-Chek Softclix Lancets lancets Use as instructed 100 each 0   acetaminophen (TYLENOL) 325 MG tablet Take 325 mg by mouth 2 (two) times daily. START 2 days prior to chemo infusion. Take For 2 days; Do NOT take on the day of infusion     acyclovir (ZOVIRAX) 400 MG tablet TAKE ONE TABLET BY MOUTH TWICE DAILY 60 tablet 4   aspirin EC 81 MG tablet Take 81 mg by mouth daily. Swallow whole.     BD PEN  NEEDLE NANO 2ND GEN 32G X 4 MM MISC USE 1  ONCE DAILY 100 each 0   Blood Glucose Monitoring Suppl (ACCU-CHEK GUIDE) w/Device KIT USE AS DIRECTED TO CHECK GLUCOSE     Calcium Carbonate-Vit D-Min (CALCIUM 1200 PO) Take 1 capsule by mouth daily.     carvedilol (COREG) 3.125 MG tablet TAKE ONE TABLET BY MOUTH TWICE DAILY 60 tablet 1   cholecalciferol (VITAMIN D3) 25 MCG (1000 UNIT) tablet Take 1,000 Units by mouth daily.     dexamethasone (DECADRON) 4 MG tablet Take 5 tablets (20 mg total) by mouth as directed. Take one hour before monthly Darzalex injections. 15 tablet 3   diphenoxylate-atropine (LOMOTIL) 2.5-0.025 MG tablet Take 1 tablet by mouth 4 times daily as needed for diarrhea or loose stools. Take along with immodium. 60 tablet 3   furosemide (LASIX) 20 MG tablet Take 1 tablet (20 mg total) by mouth daily. Take it in AM. 30 tablet 0   Glucerna (GLUCERNA) LIQD Take 237 mLs by mouth.     glucose blood (ACCU-CHEK GUIDE) test strip Use as instructed 100 each 0   hydrOXYzine (ATARAX) 10 MG tablet Take 1 tablet (10 mg total) by mouth at bedtime as needed. 90 tablet 1   Infant Foods (GOOD START 2 ESSENTIALS/IRON PO) Take 1 tablet by mouth daily.     insulin glargine (LANTUS SOLOSTAR) 100 UNIT/ML Solostar Pen Inject 10 Units into the skin daily. 6 units every day except Wed and Thursday- 8 units 15 mL 0   lenalidomide (REVLIMID) 10 MG capsule Take 1 capsule (10 mg total) by mouth daily. Take for 21 days, then hold for 7 days. Repeat every 28 days. 21 capsule 0   losartan (COZAAR) 50 MG tablet Take 1 tablet (50 mg total) by mouth every evening. 90 tablet 0   lovastatin (MEVACOR) 40 MG tablet Take 1 tablet (40 mg total) by mouth every evening. 90 tablet 1   metFORMIN (GLUCOPHAGE) 1000 MG tablet Take 1 tablet (1,000 mg total) by mouth 2 (two) times daily. 180 tablet 1   montelukast (SINGULAIR) 10 MG tablet TAKE ONE TABLET BY MOUTH ONCE DAILY, STARTING TWO DAYS prior TO infusion. take FOR TWO DAYS AND DO  not take ON DAY of infusion 20 tablet 1   ondansetron (ZOFRAN) 8 MG tablet One pill every 8 hours as needed for nausea/vomitting. 40 tablet 1   pantoprazole (PROTONIX) 40 MG tablet TAKE ONE TABLET BY MOUTH ONCE DAILY 90 tablet 0  prochlorperazine (COMPAZINE) 10 MG tablet Take 1 tablet (10 mg total) by mouth every 6 (six) hours as needed for nausea or vomiting. 40 tablet 1   sertraline (ZOLOFT) 25 MG tablet TAKE ONE TABLET BY MOUTH ONCE DAILY 90 tablet 1   triamcinolone (NASACORT) 55 MCG/ACT AERO nasal inhaler 2 sprays 2 (two) times daily.     No current facility-administered medications for this visit.   Facility-Administered Medications Ordered in Other Visits  Medication Dose Route Frequency Provider Last Rate Last Admin   acetaminophen (TYLENOL) tablet 650 mg  650 mg Oral Once Louretta Shorten R, MD       diphenhydrAMINE (BENADRYL) capsule 50 mg  50 mg Oral Once Earna Coder, MD        PHYSICAL EXAMINATION:  Vitals:   10/03/22 1044  BP: 130/61  Pulse: 81  Temp: (!) 97.1 F (36.2 C)  SpO2: 96%    Filed Weights   10/03/22 1044  Weight: 136 lb 1.6 oz (61.7 kg)     Physical Exam Vitals and nursing note reviewed.  HENT:     Head: Normocephalic and atraumatic.     Mouth/Throat:     Pharynx: Oropharynx is clear.  Eyes:     Extraocular Movements: Extraocular movements intact.     Pupils: Pupils are equal, round, and reactive to light.  Cardiovascular:     Rate and Rhythm: Normal rate and regular rhythm.  Pulmonary:     Comments: Decreased breath sounds bilaterally.  Abdominal:     Palpations: Abdomen is soft.  Musculoskeletal:        General: Normal range of motion.     Cervical back: Normal range of motion.  Skin:    General: Skin is warm.  Neurological:     General: No focal deficit present.     Mental Status: She is alert and oriented to person, place, and time.  Psychiatric:        Behavior: Behavior normal.        Judgment: Judgment normal.       LABORATORY DATA:  I have reviewed the data as listed Lab Results  Component Value Date   WBC 6.0 10/03/2022   HGB 10.8 (L) 10/03/2022   HCT 33.1 (L) 10/03/2022   MCV 102.5 (H) 10/03/2022   PLT 241 10/03/2022   Recent Labs    07/04/22 0935 08/01/22 1311 08/29/22 0905 10/03/22 1048  NA 134* 138 136 137  K 3.9 3.6 3.6 3.5  CL 105 107 104 105  CO2 23 22 23 22   GLUCOSE 149* 213* 155* 237*  BUN 19 17 20 16   CREATININE 1.14* 0.99 0.90 1.06  CALCIUM 9.0 9.3 9.7 9.2  GFRNONAA 47* 56* >60  --   PROT 6.2* 5.8* 6.6 6.8  ALBUMIN 3.7 3.6 3.7 4.2  AST 23 20 18 20   ALT 19 12 15 15   ALKPHOS 56 53 60 59  BILITOT 0.5 0.4 0.6 0.6    RADIOGRAPHIC STUDIES: I have personally reviewed the radiological images as listed and agreed with the findings in the report. No results found.  Multiple myeloma not having achieved remission (HCC) # STAGE I- MULTIPLE MYELOMA -Active [bone lesions; hypercalcemia; anemia; No renal insuffiencey;Bone marrow-32% plasma cell-   STANDARD Cytogenetics].  Currently on Revlimid-Dex- Dara SQ. Partial response noted;  APRIL 2024-M protein = NEG;  K/L RATIO= 1. 6= WNL;  Stable.   # Proceed with Dara SQ today + DEX 20 mg PO [at home/pre-meds; none today- will givendex in clinic.];  Labs today reviewed;  acceptable for treatment today. On Revlimid at 10 mg dose 3 weeks on 1 week off-    Stable.    #Reflux-like symptoms-  Secondary to dexamethasone; on PPI- continue low-dose 4 mg weekly/WED.  and wants to hold off GI evaluation [per daughter]- improved. Stable.   # Anemia-moderate- multifactorial-MM/ IDA-  continue trial of gentle iron. Hb [JAn 2024]- 9-10  stable. JUNE 2024- iron sat-12%- stable. Continue venofer.   # Mild swelling in leg/ likely dependant/compression- no concerns of DVT- trial of lasix 20 mg/day.    # Diarrhea- improved on Revlimid 10 mg a day- 3 weeks-ON & 1 week OFF. Stable; refilled lomotil- stable.   # Diabetes-on oral hypoglycemic agents;  on long acting insulin; 14 units of lantus on T/W/Thursday- Defer to PCP. PBF- BG-237 stable.   # labile BP- recent hypotension- but in general HTN: continue losartan; continue coreg; BP- 150-160- refilled coreg; monitor for now. stable.   # Hypercalcemia secondary to multiple myeloma- [FEB 2023-Zometa ; calcitonin]-calcium today is 9.6. Zometa q 71M- stable.  # Weight loss-s/p re-veluation with Joli -stable.  #DVT/shingles prophylaxis: Aspirin/acyclovir.stable  # Vaccinations; Ok with flu/COVID shot  # IV access:PIV   [facility]- [phone]  zometa-q 3 months [next sep 2024-]- ; MM; K/L q 4 W  # DISPOSITION:  # proceed with  dara SQ; also proceed with venofer-  # follow up in 4 weeks/Friday  MD-labs- cbc/cmp;BNP ; MM panel;K/L light chains;  Dara SQ; possible venofer; Dr.B    All questions were answered. The patient knows to call the clinic with any problems, questions or concerns.    Earna Coder, MD 10/03/2022 12:04 PM

## 2022-10-03 NOTE — Patient Instructions (Signed)
[facility] [place] [facility]  Discharge Instructions: Thank you for choosing [facility] to provide your oncology and hematology care.  If you have a lab appointment with the Cancer Center, please go directly to the Cancer Center and check in at the registration area.  Wear comfortable clothing and clothing appropriate for easy access to any Portacath or PICC line.   We strive to give you quality time with your provider. You may need to reschedule your appointment if you arrive late (15 or more minutes).  Arriving late affects you and other patients whose appointments are after yours.  Also, if you miss three or more appointments without notifying the office, you may be dismissed from the clinic at the provider's discretion.      For prescription refill requests, have your pharmacy contact our office and allow 72 hours for refills to be completed.    Today you received the following chemotherapy and/or immunotherapy agents Darzalex Faspro      To help prevent nausea and vomiting after your treatment, we encourage you to take your nausea medication as directed.  BELOW ARE SYMPTOMS THAT SHOULD BE REPORTED IMMEDIATELY: *FEVER GREATER THAN 100.4 F (38 C) OR HIGHER *CHILLS OR SWEATING *NAUSEA AND VOMITING THAT IS NOT CONTROLLED WITH YOUR NAUSEA MEDICATION *UNUSUAL SHORTNESS OF BREATH *UNUSUAL BRUISING OR BLEEDING *URINARY PROBLEMS (pain or burning when urinating, or frequent urination) *BOWEL PROBLEMS (unusual diarrhea, constipation, pain near the anus) TENDERNESS IN MOUTH AND THROAT WITH OR WITHOUT PRESENCE OF ULCERS (sore throat, sores in mouth, or a toothache) UNUSUAL RASH, SWELLING OR PAIN  UNUSUAL VAGINAL DISCHARGE OR ITCHING   Items with * indicate a potential emergency and should be followed up as soon as possible or go to the Emergency Department if any problems should occur.  Please show the CHEMOTHERAPY ALERT CARD or IMMUNOTHERAPY ALERT CARD at  check-in to the Emergency Department and triage nurse.  Should you have questions after your visit or need to cancel or reschedule your appointment, please contact [facility] [place] [facility]  [phone] and follow the prompts.  Office hours are 8:00 a.m. to 4:30 p.m. Monday - Friday. Please note that voicemails left after 4:00 p.m. may not be returned until the following business day.  We are closed weekends and major holidays. You have access to a nurse at all times for urgent questions. Please call the main number to the clinic [phone] and follow the prompts.  For any non-urgent questions, you may also contact your provider using MyChart. We now offer e-Visits for anyone 18 and older to request care online for non-urgent symptoms. For details visit [email or web address].   Also download the MyChart app! Go to the app store, search "MyChart", open the app, select [facility], and log in with your MyChart username and password.    

## 2022-10-06 LAB — MULTIPLE MYELOMA PANEL, SERUM
Albumin SerPl Elph-Mcnc: 3.7 g/dL (ref 2.9–4.4)
Albumin/Glob SerPl: 1.7 (ref 0.7–1.7)
Alpha 1: 0.2 g/dL (ref 0.0–0.4)
Alpha2 Glob SerPl Elph-Mcnc: 0.7 g/dL (ref 0.4–1.0)
B-Globulin SerPl Elph-Mcnc: 0.8 g/dL (ref 0.7–1.3)
Gamma Glob SerPl Elph-Mcnc: 0.6 g/dL (ref 0.4–1.8)
Globulin, Total: 2.3 g/dL (ref 2.2–3.9)
IgA: 76 mg/dL (ref 64–422)
IgG (Immunoglobin G), Serum: 621 mg/dL (ref 586–1602)
IgM (Immunoglobulin M), Srm: 25 mg/dL — ABNORMAL LOW (ref 26–217)
Total Protein ELP: 6 g/dL (ref 6.0–8.5)

## 2022-10-06 LAB — KAPPA/LAMBDA LIGHT CHAINS
Kappa free light chain: 19 mg/L (ref 3.3–19.4)
Kappa, lambda light chain ratio: 1.12 (ref 0.26–1.65)
Lambda free light chains: 17 mg/L (ref 5.7–26.3)

## 2022-10-17 ENCOUNTER — Other Ambulatory Visit: Payer: Self-pay | Admitting: Family Medicine

## 2022-10-17 DIAGNOSIS — R600 Localized edema: Secondary | ICD-10-CM

## 2022-10-17 NOTE — Telephone Encounter (Signed)
Requested Prescriptions  Pending Prescriptions Disp Refills   furosemide (LASIX) 20 MG tablet [Pharmacy Med Name: furosemide 20 mg tablet] 30 tablet 0    Sig: TAKE ONE TABLET BY MOUTH EVERY MORNING     Cardiovascular:  Diuretics - Loop Failed - 10/17/2022 10:28 AM      Failed - Mg Level in normal range and within 180 days    Magnesium  Date Value Ref Range Status  05/06/2021 2.0 1.7 - 2.4 mg/dL Final    Comment:    Performed at [facility], [address] [place], [place] [address]         Passed - K in normal range and within 180 days    Potassium  Date Value Ref Range Status  10/03/2022 3.5 3.5 - 5.1 mmol/L Final         Passed - Ca in normal range and within 180 days    Calcium  Date Value Ref Range Status  10/03/2022 9.2 8.9 - 10.3 mg/dL Final   Calcium, Total (PTH)  Date Value Ref Range Status  05/02/2021 11.0 (H) 8.7 - 10.3 mg/dL Final         Passed - Na in normal range and within 180 days    Sodium  Date Value Ref Range Status  10/03/2022 137 135 - 145 mmol/L Final  02/01/2021 138 134 - 144 mmol/L Final         Passed - Cr in normal range and within 180 days    Creatinine  Date Value Ref Range Status  10/03/2022 1.06 0.60 - 1.20 mg/dL Final         Passed - Cl in normal range and within 180 days    Chloride  Date Value Ref Range Status  10/03/2022 105 98 - 111 mmol/L Final         Passed - Last BP in normal range    BP Readings from Last 1 Encounters:  10/03/22 130/61         Passed - Valid encounter within last 6 months    Recent Outpatient Visits           1 month ago Type 2 diabetes mellitus without complication, with long-term current use of insulin (HCC)   [facility] at [facility], MD   5 months ago Type 2 diabetes mellitus without complication, with long-term current use of insulin (HCC)   [facility], MD   5  months ago Acute sinusitis, recurrence not specified, unspecified location   [facility] at [facility], MD   9 months ago Type 2 diabetes mellitus without complication, with long-term current use of insulin (HCC)   [place] [facility] [facility], MD   1 year ago Type 2 diabetes mellitus with other specified complication, with long-term current use of insulin [facility])   [facility], MD       Future Appointments             In 2 months Duanne Limerick, MD [facility] at [facility]

## 2022-10-20 ENCOUNTER — Telehealth: Payer: Self-pay | Admitting: *Deleted

## 2022-10-20 DIAGNOSIS — C9000 Multiple myeloma not having achieved remission: Secondary | ICD-10-CM

## 2022-10-20 MED ORDER — LENALIDOMIDE 10 MG PO CAPS
10.0000 mg | ORAL_CAPSULE | Freq: Every day | ORAL | 0 refills | Status: DC
Start: 2022-10-20 — End: 2022-11-12

## 2022-10-20 NOTE — Telephone Encounter (Signed)
Refill for revlimid sent to [facility]

## 2022-10-31 ENCOUNTER — Inpatient Hospital Stay: Payer: Medicare Other | Attending: Nurse Practitioner

## 2022-10-31 ENCOUNTER — Encounter: Payer: Self-pay | Admitting: Internal Medicine

## 2022-10-31 ENCOUNTER — Inpatient Hospital Stay: Payer: Medicare Other

## 2022-10-31 ENCOUNTER — Inpatient Hospital Stay: Payer: Medicare Other | Admitting: Internal Medicine

## 2022-10-31 VITALS — BP 143/56 | HR 65 | Temp 97.5°F | Ht 66.0 in | Wt 130.2 lb

## 2022-10-31 VITALS — BP 180/65 | HR 73

## 2022-10-31 DIAGNOSIS — E876 Hypokalemia: Secondary | ICD-10-CM | POA: Insufficient documentation

## 2022-10-31 DIAGNOSIS — G9341 Metabolic encephalopathy: Secondary | ICD-10-CM

## 2022-10-31 DIAGNOSIS — I509 Heart failure, unspecified: Secondary | ICD-10-CM

## 2022-10-31 DIAGNOSIS — C9000 Multiple myeloma not having achieved remission: Secondary | ICD-10-CM | POA: Diagnosis not present

## 2022-10-31 DIAGNOSIS — D509 Iron deficiency anemia, unspecified: Secondary | ICD-10-CM | POA: Insufficient documentation

## 2022-10-31 DIAGNOSIS — I10 Essential (primary) hypertension: Secondary | ICD-10-CM | POA: Diagnosis not present

## 2022-10-31 DIAGNOSIS — Z5112 Encounter for antineoplastic immunotherapy: Secondary | ICD-10-CM | POA: Diagnosis not present

## 2022-10-31 DIAGNOSIS — R600 Localized edema: Secondary | ICD-10-CM | POA: Insufficient documentation

## 2022-10-31 LAB — CBC WITH DIFFERENTIAL/PLATELET
Abs Immature Granulocytes: 0.01 10*3/uL (ref 0.00–0.07)
Basophils Absolute: 0.1 10*3/uL (ref 0.0–0.1)
Basophils Relative: 1 %
Eosinophils Absolute: 0.2 10*3/uL (ref 0.0–0.5)
Eosinophils Relative: 4 %
HCT: 33 % — ABNORMAL LOW (ref 36.0–46.0)
Hemoglobin: 10.4 g/dL — ABNORMAL LOW (ref 12.0–15.0)
Immature Granulocytes: 0 %
Lymphocytes Relative: 31 %
Lymphs Abs: 1.3 10*3/uL (ref 0.7–4.0)
MCH: 32.1 pg (ref 26.0–34.0)
MCHC: 31.5 g/dL (ref 30.0–36.0)
MCV: 101.9 fL — ABNORMAL HIGH (ref 80.0–100.0)
Monocytes Absolute: 0.3 10*3/uL (ref 0.1–1.0)
Monocytes Relative: 8 %
Neutro Abs: 2.5 10*3/uL (ref 1.7–7.7)
Neutrophils Relative %: 56 %
Platelets: 181 10*3/uL (ref 150–400)
RBC: 3.24 MIL/uL — ABNORMAL LOW (ref 3.87–5.11)
RDW: 13.8 % (ref 11.5–15.5)
WBC: 4.4 10*3/uL (ref 4.0–10.5)
nRBC: 0 % (ref 0.0–0.2)

## 2022-10-31 LAB — CMP (CANCER CENTER ONLY)
ALT: 11 U/L (ref 0–44)
AST: 17 U/L (ref 15–41)
Albumin: 4.1 g/dL (ref 3.5–5.0)
Alkaline Phosphatase: 52 U/L (ref 38–126)
Anion gap: 8 (ref 5–15)
BUN: 17 mg/dL (ref 8–23)
CO2: 24 mmol/L (ref 22–32)
Calcium: 9 mg/dL (ref 8.9–10.3)
Chloride: 103 mmol/L (ref 98–111)
Creatinine: 1.12 mg/dL — ABNORMAL HIGH (ref 0.44–1.00)
GFR, Estimated: 48 mL/min — ABNORMAL LOW (ref >60)
Glucose, Bld: 137 mg/dL — ABNORMAL HIGH (ref 70–99)
Potassium: 3.2 mmol/L — ABNORMAL LOW (ref 3.5–5.1)
Sodium: 135 mmol/L (ref 135–145)
Total Bilirubin: 0.7 mg/dL (ref 0.3–1.2)
Total Protein: 6.7 g/dL (ref 6.5–8.1)

## 2022-10-31 LAB — BRAIN NATRIURETIC PEPTIDE: B Natriuretic Peptide: 73.7 pg/mL (ref 0.0–100.0)

## 2022-10-31 MED ORDER — SODIUM CHLORIDE 0.9 % IV SOLN
200.0000 mg | Freq: Once | INTRAVENOUS | Status: AC
  Administered 2022-10-31: 200 mg via INTRAVENOUS
  Filled 2022-10-31: qty 200

## 2022-10-31 MED ORDER — ACETAMINOPHEN 325 MG PO TABS
650.0000 mg | ORAL_TABLET | Freq: Once | ORAL | Status: AC
  Administered 2022-10-31: 650 mg via ORAL
  Filled 2022-10-31: qty 2

## 2022-10-31 MED ORDER — DIPHENHYDRAMINE HCL 25 MG PO CAPS
50.0000 mg | ORAL_CAPSULE | Freq: Once | ORAL | Status: AC
  Administered 2022-10-31: 50 mg via ORAL
  Filled 2022-10-31: qty 2

## 2022-10-31 MED ORDER — SODIUM CHLORIDE 0.9 % IV SOLN
Freq: Once | INTRAVENOUS | Status: AC
  Filled 2022-10-31: qty 250

## 2022-10-31 MED ORDER — DARATUMUMAB-HYALURONIDASE-FIHJ 1800-30000 MG-UT/15ML ~~LOC~~ SOLN
1800.0000 mg | Freq: Once | SUBCUTANEOUS | Status: AC
  Administered 2022-10-31: 1800 mg via SUBCUTANEOUS
  Filled 2022-10-31: qty 15

## 2022-10-31 NOTE — Progress Notes (Signed)
[facility] CONSULT NOTE  Patient Care Team: Duanne Limerick, MD as PCP - General (Family Medicine) Lajean Manes, [facility] (Inactive) (Pharmacist) Earna Coder, MD as Consulting Physician (Oncology)  CHIEF COMPLAINTS/PURPOSE OF CONSULTATION: Multiple myeloma  Oncology History Overview Note  # MULTIPLE MYELOMA  [FEB 2023-hypercalcemia status changes]-Active [bone lesions; hypercalcemia; anemia; No renal insuffiencey;Bone marrow-32% plasma cell-   STANDARD Cytogenetics].FEB 2023- S/p dexamethasone 20 mg q day x4.   # REV [15 mg -3 week-On and 1 week-OFF]-DARA; May 2023-discontinued rev 15 diarrhea/hypotension  # MAY 2023- dara-Rev 10 mg 3w/1w   # FEB 2023-hypercalcemia [[facility]-status post calcitonin; bisphosphonate]  BONE MARROW, ASPIRATE, CLOT, CORE:  -Hypercellular bone marrow with plasma cell neoplasm  -See comment   PERIPHERAL BLOOD:  -Normocytic-normochromic anemia   COMMENT:   The bone marrow is hypercellular for age with increased number of  atypical plasma cells representing 32% of all cells in the aspirate  associated with interstitial infiltrates and numerous variably sized  clusters in the clot/biopsy sections.  The plasma cells display weak  kappa light chain restriction consistent with plasma cell neoplasm.  Correlation with cytogenetic and FISH studies is recommended   MICROSCOPIC DESCRIPTION:   PERIPHERAL BLOOD SMEAR: The red blood cells display mild  anisopoikilocytosis with mild polychromasia.  The white blood cells are  normal number with scattered hypogranular neutrophils. An occasional  myelocyte and large atypical mononuclear cells are seen on scan. The  platelets are normal in number.    Multiple myeloma not having achieved remission (HCC)  05/15/2021 Initial Diagnosis   Multiple myeloma not having achieved remission (HCC)   06/27/2021 - 10/17/2021 Chemotherapy   Patient is on Treatment Plan : MYELOMA Daratumumab SQ + Lenalidomide +  Dexamethasone (DaraRd) q28d     06/27/2021 -  Chemotherapy   Patient is on Treatment Plan : MYELOMA RELAPSED REFRACTORY Daratumumab SQ + Lenalidomide + Dexamethasone (DaraRd) q28d     07/11/2021 Cancer Staging   Staging form: Plasma Cell Myeloma and Plasma Cell Disorders, AJCC 8th Edition - Clinical: Beta-2-microglobulin (mg/L): 2.6, Albumin (g/dL): 3.9, ISS: Stage I - Signed by Earna Coder, MD on 07/11/2021 Stage prefix: Initial diagnosis Beta 2 microglobulin range (mg/L): Less than 3.5 Albumin range (g/dL): Greater than or equal to 3.5     HISTORY OF PRESENTING ILLNESS: pt in a wheel chair; accompanied by her daughter.   Cristina Morgan 88 y.o.  female with  multiple myeloma currently on Rev-Dex is proceed with Dara SQ today.    C/o of mild diarrhea- multiple stools in last- ? Sec to food. 1-3 loose stools. Taking lomotil as needed. ? Compliance.   Chronic mild fatigue.  Otherwise no worsening shortness of breath or cough.   Denies any nausea vomiting.  Denies any blood in stools or black-colored stools.  Patient states her back pain is improved.  Denies any worsening joint pains.   Review of Systems  Constitutional:  Positive for malaise/fatigue and weight loss. Negative for chills, diaphoresis and fever.  HENT:  Negative for nosebleeds and sore throat.   Eyes:  Negative for double vision.  Respiratory:  Negative for cough, hemoptysis, sputum production, shortness of breath and wheezing.   Cardiovascular:  Negative for chest pain, palpitations, orthopnea and leg swelling.  Gastrointestinal:  Negative for abdominal pain, blood in stool, constipation, diarrhea, heartburn, melena, nausea and vomiting.  Genitourinary:  Negative for dysuria, frequency and urgency.  Musculoskeletal:  Positive for back pain and joint pain.  Skin: Negative.  Negative for itching and rash.  Neurological:  Negative for dizziness, tingling, focal weakness, weakness and headaches.   Endo/Heme/Allergies:  Does not bruise/bleed easily.  Psychiatric/Behavioral:  Negative for depression. The patient is not nervous/anxious and does not have insomnia.     MEDICAL HISTORY:  Past Medical History:  Diagnosis Date   Anemia    CAD (coronary artery disease)    Cataracts, bilateral    Closed compression fracture of first lumbar vertebra (HCC)    Closed compression fracture of second lumbar vertebra (HCC)    Diabetes mellitus without complication (HCC)    High cholesterol    History of pneumonia 2010   [place]   History of uterine fibroid    Hypercalcemia    Hyperlipidemia    Hypertension    Lytic bone lesions on xray    Multiple myeloma (HCC)    Osteoporosis    Primary osteoarthritis of right knee     SURGICAL HISTORY: Past Surgical History:  Procedure Laterality Date   CATARACT EXTRACTION Bilateral 2014   COLONOSCOPY  2011   cleared for 5 yrs- [facility]   ECTOPIC PREGNANCY SURGERY      SOCIAL HISTORY: Social History   Socioeconomic History   Marital status: Married    Spouse name: Not on file   Number of children: 2   Years of education: Not on file   Highest education level: 12th grade  Occupational History   Occupation: Retired  Tobacco Use   Smoking status: Never    Passive exposure: Never   Smokeless tobacco: Never   Tobacco comments:    smoking cessation materials not required  Vaping Use   Vaping status: Never Used  Substance and Sexual Activity   Alcohol use: No   Drug use: No   Sexual activity: Not Currently  Other Topics Concern   Not on file  Social History Narrative   [place] with husband; drives. Never smoked; no alcohol. Daughters live close.    Social Determinants of Health   Financial Resource Strain: Low Risk  (11/13/2021)   Overall Financial Resource Strain (CARDIA)    Difficulty of Paying Living Expenses: Not hard at all  Food Insecurity: No Food Insecurity (11/13/2021)   Hunger Vital Sign    Worried About Running  Out of Food in the Last Year: Never true    Ran Out of Food in the Last Year: Never true  Transportation Needs: No Transportation Needs (11/13/2021)   PRAPARE - Administrator, Civil Service (Medical): No    Lack of Transportation (Non-Medical): No  Physical Activity: Sufficiently Active (11/13/2021)   Exercise Vital Sign    Days of Exercise per Week: 7 days    Minutes of Exercise per Session: 30 min  Stress: No Stress Concern Present (11/13/2021)   [employer] of Occupational Health - Occupational Stress Questionnaire    Feeling of Stress : Not at all  Social Connections: Moderately Integrated (11/13/2021)   Social Connection and Isolation Panel [NHANES]    Frequency of Communication with Friends and Family: Twice a week    Frequency of Social Gatherings with Friends and Family: More than three times a week    Attends Religious Services: 1 to 4 times per year    Active Member of [employer] or Organizations: No    Attends Banker Meetings: Never    Marital Status: Married  Catering manager Violence: Not At Risk (11/13/2021)   Humiliation, Afraid, Rape, and Kick questionnaire    Fear  of Current or Ex-Partner: No    Emotionally Abused: No    Physically Abused: No    Sexually Abused: No    FAMILY HISTORY: Family History  Problem Relation Age of Onset   Cancer Mother        breast   Heart disease Mother    Diabetes Father    Heart disease Father    Heart attack Father    Heart attack Brother    Diabetes Brother     ALLERGIES:  is allergic to latex and penicillins.  MEDICATIONS:  Current Outpatient Medications  Medication Sig Dispense Refill   Accu-Chek Softclix Lancets lancets Use as instructed 100 each 0   acetaminophen (TYLENOL) 325 MG tablet Take 325 mg by mouth 2 (two) times daily. START 2 days prior to chemo infusion. Take For 2 days; Do NOT take on the day of infusion     acyclovir (ZOVIRAX) 400 MG tablet TAKE ONE TABLET BY MOUTH TWICE DAILY 60  tablet 4   aspirin EC 81 MG tablet Take 81 mg by mouth daily. Swallow whole.     BD PEN NEEDLE NANO 2ND GEN 32G X 4 MM MISC USE 1  ONCE DAILY 100 each 0   Blood Glucose Monitoring Suppl (ACCU-CHEK GUIDE) w/Device KIT USE AS DIRECTED TO CHECK GLUCOSE     Calcium Carbonate-Vit D-Min (CALCIUM 1200 PO) Take 1 capsule by mouth daily.     carvedilol (COREG) 3.125 MG tablet TAKE ONE TABLET BY MOUTH TWICE DAILY 60 tablet 1   cholecalciferol (VITAMIN D3) 25 MCG (1000 UNIT) tablet Take 1,000 Units by mouth daily.     dexamethasone (DECADRON) 4 MG tablet Take 5 tablets (20 mg total) by mouth as directed. Take one hour before monthly Darzalex injections. 15 tablet 3   diphenoxylate-atropine (LOMOTIL) 2.5-0.025 MG tablet Take 1 tablet by mouth 4 times daily as needed for diarrhea or loose stools. Take along with immodium. 60 tablet 3   furosemide (LASIX) 20 MG tablet TAKE ONE TABLET BY MOUTH EVERY MORNING 30 tablet 0   Glucerna (GLUCERNA) LIQD Take 237 mLs by mouth.     glucose blood (ACCU-CHEK GUIDE) test strip Use as instructed 100 each 0   hydrOXYzine (ATARAX) 10 MG tablet Take 1 tablet (10 mg total) by mouth at bedtime as needed. 90 tablet 1   Infant Foods (GOOD START 2 ESSENTIALS/IRON PO) Take 1 tablet by mouth daily.     insulin glargine (LANTUS SOLOSTAR) 100 UNIT/ML Solostar Pen Inject 10 Units into the skin daily. 6 units every day except Wed and Thursday- 8 units 15 mL 0   lenalidomide (REVLIMID) 10 MG capsule Take 1 capsule (10 mg total) by mouth daily. Take for 21 days, then hold for 7 days. Repeat every 28 days. 21 capsule 0   losartan (COZAAR) 50 MG tablet Take 1 tablet (50 mg total) by mouth every evening. 90 tablet 0   lovastatin (MEVACOR) 40 MG tablet Take 1 tablet (40 mg total) by mouth every evening. 90 tablet 1   metFORMIN (GLUCOPHAGE) 1000 MG tablet Take 1 tablet (1,000 mg total) by mouth 2 (two) times daily. 180 tablet 1   montelukast (SINGULAIR) 10 MG tablet TAKE ONE TABLET BY MOUTH ONCE  DAILY, STARTING TWO DAYS prior TO infusion. take FOR TWO DAYS AND DO not take ON DAY of infusion 20 tablet 1   ondansetron (ZOFRAN) 8 MG tablet One pill every 8 hours as needed for nausea/vomitting. 40 tablet 1   pantoprazole (PROTONIX) 40  MG tablet TAKE ONE TABLET BY MOUTH ONCE DAILY 90 tablet 0   prochlorperazine (COMPAZINE) 10 MG tablet Take 1 tablet (10 mg total) by mouth every 6 (six) hours as needed for nausea or vomiting. 40 tablet 1   sertraline (ZOLOFT) 25 MG tablet TAKE ONE TABLET BY MOUTH ONCE DAILY 90 tablet 1   triamcinolone (NASACORT) 55 MCG/ACT AERO nasal inhaler 2 sprays 2 (two) times daily.     No current facility-administered medications for this visit.    PHYSICAL EXAMINATION:  Vitals:   10/31/22 1003  BP: (!) 143/56  Pulse: 65  Temp: (!) 97.5 F (36.4 C)  SpO2: 99%    Filed Weights   10/31/22 1003  Weight: 130 lb 3.2 oz (59.1 kg)     Physical Exam Vitals and nursing note reviewed.  HENT:     Head: Normocephalic and atraumatic.     Mouth/Throat:     Pharynx: Oropharynx is clear.  Eyes:     Extraocular Movements: Extraocular movements intact.     Pupils: Pupils are equal, round, and reactive to light.  Cardiovascular:     Rate and Rhythm: Normal rate and regular rhythm.  Pulmonary:     Comments: Decreased breath sounds bilaterally.  Abdominal:     Palpations: Abdomen is soft.  Musculoskeletal:        General: Normal range of motion.     Cervical back: Normal range of motion.  Skin:    General: Skin is warm.  Neurological:     General: No focal deficit present.     Mental Status: She is alert and oriented to person, place, and time.  Psychiatric:        Behavior: Behavior normal.        Judgment: Judgment normal.      LABORATORY DATA:  I have reviewed the data as listed Lab Results  Component Value Date   WBC 4.4 10/31/2022   HGB 10.4 (L) 10/31/2022   HCT 33.0 (L) 10/31/2022   MCV 101.9 (H) 10/31/2022   PLT 181 10/31/2022   Recent  Labs    08/01/22 1311 08/29/22 0905 10/03/22 1048 10/31/22 0955  NA 138 136 137 135  K 3.6 3.6 3.5 3.2*  CL 107 104 105 103  CO2 22 23 22 24   GLUCOSE 213* 155* 237* 137*  BUN 17 20 16 17   CREATININE 0.99 0.90 1.06 1.12*  CALCIUM 9.3 9.7 9.2 9.0  GFRNONAA 56* >60  --  48*  PROT 5.8* 6.6 6.8 6.7  ALBUMIN 3.6 3.7 4.2 4.1  AST 20 18 20 17   ALT 12 15 15 11   ALKPHOS 53 60 59 52  BILITOT 0.4 0.6 0.6 0.7    RADIOGRAPHIC STUDIES: I have personally reviewed the radiological images as listed and agreed with the findings in the report. No results found.  Multiple myeloma not having achieved remission (HCC) # STAGE I- MULTIPLE MYELOMA -Active [bone lesions; hypercalcemia; anemia; No renal insuffiencey;Bone marrow-32% plasma cell-   STANDARD Cytogenetics].  Currently on Revlimid-Dex- Dara SQ. Partial response noted;  JULY 2024-M protein = NEG;  K/L RATIO= 1. 6= WNL;  Stable.   # Proceed with Dara SQ today + DEX 20 mg PO [at home/pre-meds; none today- will givendex in clinic.]; Labs today reviewed;  acceptable for treatment today. On Revlimid at 10 mg dose 3 weeks on 1 week off-  Stable.    #Reflux-like symptoms-  Secondary to dexamethasone; on PPI- continue low-dose 4 mg weekly/WED.  and wants to hold  off GI evaluation [per daughter]- improved. Stable.   # Anemia-moderate- multifactorial-MM/ IDA-  continue trial of gentle iron. Hb [JAn 2024]- 9-10  stable. JUNE 2024- iron sat-12%- stable. Continue venofer.   # Hypokalemia- recommend dietary changes. Hold off script  # Mild swelling in leg/ likely dependant/compression- no concerns of DVT- t stable-on lasix 20 mg/day.    # Diarrhea- improved on Revlimid 10 mg a day- 3 weeks-ON & 1 week OFF. Stable; refilled lomotil- stable.   # Diabetes-on oral hypoglycemic agents; on long acting insulin; 14 units of lantus on T/W/Thursday- Defer to PCP. PBF- BG-237  stable.    # labile BP- recent hypotension- but in general HTN: continue losartan;  continue coreg; BP- 150-160- refilled coreg; monitor for now. stable.   # Hypercalcemia secondary to multiple myeloma- [FEB 2023-Zometa ; calcitonin]-calcium today is 9.6. Zometa q 58M-   # Weight loss-s/p re-veluation with Joli -stable.  #DVT/shingles prophylaxis: Aspirin/acyclovirstable.  # Vaccinations; Ok with flu/COVID shot  # IV access:PIV   [facility]- [phone]  zometa-q 3 months [next sep 2024-]- ; MM; K/L q 4 W  # DISPOSITION:  # proceed with  dara SQ; also proceed with venofer-  # follow up in 4 weeks/Friday  MD-labs- cbc/cmp;BNP ; MM panel;K/L light chains;  Dara SQ; possible venofer;Zometa  Dr.B   All questions were answered. The patient knows to call the clinic with any problems, questions or concerns.    Earna Coder, MD 10/31/2022 11:13 AM

## 2022-10-31 NOTE — Patient Instructions (Signed)
[facility]  Discharge Instructions: Thank you for choosing [facility] to provide your oncology and hematology care.  If you have a lab appointment with the Cancer Center, please go directly to the Cancer Center and check in at the registration area.  Wear comfortable clothing and clothing appropriate for easy access to any Portacath or PICC line.   We strive to give you quality time with your provider. You may need to reschedule your appointment if you arrive late (15 or more minutes).  Arriving late affects you and other patients whose appointments are after yours.  Also, if you miss three or more appointments without notifying the office, you may be dismissed from the clinic at the provider's discretion.      For prescription refill requests, have your pharmacy contact our office and allow 72 hours for refills to be completed.    Today you received the following chemotherapy and/or immunotherapy agents- daratumumab      To help prevent nausea and vomiting after your treatment, we encourage you to take your nausea medication as directed.  BELOW ARE SYMPTOMS THAT SHOULD BE REPORTED IMMEDIATELY: *FEVER GREATER THAN 100.4 F (38 C) OR HIGHER *CHILLS OR SWEATING *NAUSEA AND VOMITING THAT IS NOT CONTROLLED WITH YOUR NAUSEA MEDICATION *UNUSUAL SHORTNESS OF BREATH *UNUSUAL BRUISING OR BLEEDING *URINARY PROBLEMS (pain or burning when urinating, or frequent urination) *BOWEL PROBLEMS (unusual diarrhea, constipation, pain near the anus) TENDERNESS IN MOUTH AND THROAT WITH OR WITHOUT PRESENCE OF ULCERS (sore throat, sores in mouth, or a toothache) UNUSUAL RASH, SWELLING OR PAIN  UNUSUAL VAGINAL DISCHARGE OR ITCHING   Items with * indicate a potential emergency and should be followed up as soon as possible or go to the Emergency Department if any problems should occur.  Please show the CHEMOTHERAPY ALERT CARD or IMMUNOTHERAPY ALERT CARD at check-in  to the Emergency Department and triage nurse.  Should you have questions after your visit or need to cancel or reschedule your appointment, please contact [facility]  [phone] and follow the prompts.  Office hours are 8:00 a.m. to 4:30 p.m. Monday - Friday. Please note that voicemails left after 4:00 p.m. may not be returned until the following business day.  We are closed weekends and major holidays. You have access to a nurse at all times for urgent questions. Please call the main number to the clinic [phone] and follow the prompts.  For any non-urgent questions, you may also contact your provider using MyChart. We now offer e-Visits for anyone 18 and older to request care online for non-urgent symptoms. For details visit [email or web address].   Also download the MyChart app! Go to the app store, search "MyChart", open the app, select [facility], and log in with your MyChart username and password.    

## 2022-10-31 NOTE — Assessment & Plan Note (Signed)
#   STAGE I- MULTIPLE MYELOMA -Active [bone lesions; hypercalcemia; anemia; No renal insuffiencey;Bone marrow-32% plasma cell-   STANDARD Cytogenetics].  Currently on Revlimid-Dex- Dara SQ. Partial response noted;  JULY 2024-M protein = NEG;  K/L RATIO= 1. 6= WNL;  Stable.   # Proceed with Dara SQ today + DEX 20 mg PO [at home/pre-meds; none today- will givendex in clinic.]; Labs today reviewed;  acceptable for treatment today. On Revlimid at 10 mg dose 3 weeks on 1 week off-  Stable.    #Reflux-like symptoms-  Secondary to dexamethasone; on PPI- continue low-dose 4 mg weekly/WED.  and wants to hold off GI evaluation [per daughter]- improved. Stable.   # Anemia-moderate- multifactorial-MM/ IDA-  continue trial of gentle iron. Hb [JAn 2024]- 9-10  stable. JUNE 2024- iron sat-12%- stable. Continue venofer.   # Hypokalemia- recommend dietary changes. Hold off script  # Mild swelling in leg/ likely dependant/compression- no concerns of DVT- t stable-on lasix 20 mg/day.    # Diarrhea- improved on Revlimid 10 mg a day- 3 weeks-ON & 1 week OFF. Stable; refilled lomotil- stable.   # Diabetes-on oral hypoglycemic agents; on long acting insulin; 14 units of lantus on T/W/Thursday- Defer to PCP. PBF- BG-237  stable.    # labile BP- recent hypotension- but in general HTN: continue losartan; continue coreg; BP- 150-160- refilled coreg; monitor for now. stable.   # Hypercalcemia secondary to multiple myeloma- [FEB 2023-Zometa ; calcitonin]-calcium today is 9.6. Zometa q 82M-   # Weight loss-s/p re-veluation with Joli -stable.  #DVT/shingles prophylaxis: Aspirin/acyclovirstable.  # Vaccinations; Ok with flu/COVID shot  # IV access:PIV   [facility]- [phone]  zometa-q 3 months [next sep 2024-]- ; MM; K/L q 4 W  # DISPOSITION:  # proceed with  dara SQ; also proceed with venofer-  # follow up in 4 weeks/Friday  MD-labs- cbc/cmp;BNP ; MM panel;K/L light chains;  Dara SQ; possible venofer;Zometa   Dr.B

## 2022-10-31 NOTE — Progress Notes (Signed)
C/o of itcy scalp, asking if we can check vit d level?  Has had diarrhea, multiple times a day, thinking if is why she lost 6lbs since last visit.  C/o SOB occasionally.

## 2022-11-03 LAB — KAPPA/LAMBDA LIGHT CHAINS
Kappa free light chain: 16.4 mg/L (ref 3.3–19.4)
Kappa, lambda light chain ratio: 1.39 (ref 0.26–1.65)
Lambda free light chains: 11.8 mg/L (ref 5.7–26.3)

## 2022-11-05 LAB — MULTIPLE MYELOMA PANEL, SERUM
Albumin SerPl Elph-Mcnc: 3.8 g/dL (ref 2.9–4.4)
Albumin/Glob SerPl: 1.6 (ref 0.7–1.7)
Alpha 1: 0.2 g/dL (ref 0.0–0.4)
Alpha2 Glob SerPl Elph-Mcnc: 0.7 g/dL (ref 0.4–1.0)
B-Globulin SerPl Elph-Mcnc: 0.9 g/dL (ref 0.7–1.3)
Gamma Glob SerPl Elph-Mcnc: 0.5 g/dL (ref 0.4–1.8)
Globulin, Total: 2.4 g/dL (ref 2.2–3.9)
IgA: 83 mg/dL (ref 64–422)
IgG (Immunoglobin G), Serum: 640 mg/dL (ref 586–1602)
IgM (Immunoglobulin M), Srm: 22 mg/dL — ABNORMAL LOW (ref 26–217)
Total Protein ELP: 6.2 g/dL (ref 6.0–8.5)

## 2022-11-06 ENCOUNTER — Other Ambulatory Visit: Payer: Self-pay | Admitting: Internal Medicine

## 2022-11-06 ENCOUNTER — Other Ambulatory Visit: Payer: Self-pay | Admitting: Family Medicine

## 2022-11-06 DIAGNOSIS — I10 Essential (primary) hypertension: Secondary | ICD-10-CM

## 2022-11-06 DIAGNOSIS — E7800 Pure hypercholesterolemia, unspecified: Secondary | ICD-10-CM

## 2022-11-06 DIAGNOSIS — E119 Type 2 diabetes mellitus without complications: Secondary | ICD-10-CM

## 2022-11-06 DIAGNOSIS — F5102 Adjustment insomnia: Secondary | ICD-10-CM

## 2022-11-06 DIAGNOSIS — F329 Major depressive disorder, single episode, unspecified: Secondary | ICD-10-CM

## 2022-11-06 NOTE — Telephone Encounter (Signed)
Requested Prescriptions  Pending Prescriptions Disp Refills   losartan (COZAAR) 25 MG tablet [Pharmacy Med Name: losartan 25 mg tablet] 90 tablet 1    Sig: TAKE ONE TABLET BY MOUTH EVERY EVENING     Cardiovascular:  Angiotensin Receptor Blockers Failed - 11/06/2022  8:03 AM      Failed - Cr in normal range and within 180 days    Creatinine  Date Value Ref Range Status  10/31/2022 1.12 (H) 0.44 - 1.00 mg/dL Final         Failed - K in normal range and within 180 days    Potassium  Date Value Ref Range Status  10/31/2022 3.2 (L) 3.5 - 5.1 mmol/L Final         Failed - Last BP in normal range    BP Readings from Last 1 Encounters:  10/31/22 (!) 180/65         Passed - Patient is not pregnant      Passed - Valid encounter within last 6 months    Recent Outpatient Visits           1 month ago Type 2 diabetes mellitus without complication, with long-term current use of insulin (HCC)   [place] [facility], [facility]   6 months ago Type 2 diabetes mellitus without complication, with long-term current use of insulin (HCC)   [place] [facility], [facility]   6 months ago Acute sinusitis, recurrence not specified, unspecified location   [facility], [facility]   10 months ago Type 2 diabetes mellitus without complication, with long-term current use of insulin (HCC)   [place] [facility], [facility]   1 year ago Type 2 diabetes mellitus with other specified complication, with long-term current use of insulin (HCC)   [place] [facility], MD       Future Appointments             In 2 months Duanne Limerick, MD [facility]             lovastatin (MEVACOR) 40  MG tablet [Pharmacy Med Name: lovastatin 40 mg tablet] 90 tablet 3    Sig: TAKE ONE TABLET BY MOUTH EVERY evening     Cardiovascular:  Antilipid - Statins 2 Failed - 11/06/2022  8:03 AM      Failed - Cr in normal range and within 360 days    Creatinine  Date Value Ref Range Status  10/31/2022 1.12 (H) 0.44 - 1.00 mg/dL Final         Failed - Lipid Panel in normal range within the last 12 months    Cholesterol, Total  Date Value Ref Range Status  07/24/2020 176 100 - 199 mg/dL Final   Cholesterol  Date Value Ref Range Status  05/09/2022 146 0 - 200 mg/dL Final   LDL Chol Calc (NIH)  Date Value Ref Range Status  07/24/2020 90 0 - 99 mg/dL Final   LDL Cholesterol  Date Value Ref Range Status  05/09/2022 74 0 - 99 mg/dL Final    Comment:           Total Cholesterol/HDL:CHD Risk Coronary Heart Disease Risk Table  Men   Women  1/2 Average Risk   3.4   3.3  Average Risk       5.0   4.4  2 X Average Risk   9.6   7.1  3 X Average Risk  23.4   11.0        Use the calculated Patient Ratio above and the CHD Risk Table to determine the patient's CHD Risk.        ATP III CLASSIFICATION (LDL):  <100     mg/dL   Optimal  191-478  mg/dL   Near or Above                    Optimal  130-159  mg/dL   Borderline  295-621  mg/dL   High  >308     mg/dL   Very High Performed at [facility], [address] [place], [place] [address]    HDL  Date Value Ref Range Status  05/09/2022 67 >40 mg/dL Final  69/62/9528 74 >41 mg/dL Final   Triglycerides  Date Value Ref Range Status  05/09/2022 26 <150 mg/dL Final         Passed - Patient is not pregnant      Passed - Valid encounter within last 12 months    Recent Outpatient Visits           1 month ago Type 2 diabetes mellitus without complication, with long-term current use of insulin (HCC)   [place] [facility], MD   6 months ago Type 2  diabetes mellitus without complication, with long-term current use of insulin (HCC)   [place] [facility]   6 months ago Acute sinusitis, recurrence not specified, unspecified location   [facility] at [facility], MD   10 months ago Type 2 diabetes mellitus without complication, with long-term current use of insulin (HCC)   [place] [facility], [facility]   1 year ago Type 2 diabetes mellitus with other specified complication, with long-term current use of insulin [facility])   [place] [facility], MD       Future Appointments             In 2 months Duanne Limerick, MD [facility] at [facility], [facility]

## 2022-11-12 ENCOUNTER — Telehealth: Payer: Self-pay | Admitting: *Deleted

## 2022-11-12 ENCOUNTER — Other Ambulatory Visit: Payer: Self-pay | Admitting: Family Medicine

## 2022-11-12 DIAGNOSIS — C9000 Multiple myeloma not having achieved remission: Secondary | ICD-10-CM

## 2022-11-12 DIAGNOSIS — I10 Essential (primary) hypertension: Secondary | ICD-10-CM

## 2022-11-12 DIAGNOSIS — E119 Type 2 diabetes mellitus without complications: Secondary | ICD-10-CM

## 2022-11-12 DIAGNOSIS — R600 Localized edema: Secondary | ICD-10-CM

## 2022-11-12 MED ORDER — LENALIDOMIDE 10 MG PO CAPS
10.0000 mg | ORAL_CAPSULE | Freq: Every day | ORAL | 0 refills | Status: AC
Start: 2022-11-12 — End: ?

## 2022-11-12 NOTE — Telephone Encounter (Signed)
Refill Revlimid request

## 2022-11-13 ENCOUNTER — Other Ambulatory Visit: Payer: Self-pay | Admitting: *Deleted

## 2022-11-13 ENCOUNTER — Other Ambulatory Visit: Payer: Self-pay | Admitting: Internal Medicine

## 2022-11-13 ENCOUNTER — Other Ambulatory Visit: Payer: Self-pay | Admitting: Family Medicine

## 2022-11-13 DIAGNOSIS — K219 Gastro-esophageal reflux disease without esophagitis: Secondary | ICD-10-CM

## 2022-11-13 DIAGNOSIS — C9000 Multiple myeloma not having achieved remission: Secondary | ICD-10-CM

## 2022-11-13 MED ORDER — LENALIDOMIDE 10 MG PO CAPS
10.0000 mg | ORAL_CAPSULE | Freq: Every day | ORAL | 0 refills | Status: AC
Start: 2022-11-13 — End: ?

## 2022-11-13 NOTE — Telephone Encounter (Signed)
Requested Prescriptions  Pending Prescriptions Disp Refills   losartan (COZAAR) 25 MG tablet [Pharmacy Med Name: losartan 25 mg tablet] 90 tablet 0    Sig: TAKE ONE TABLET BY MOUTH EVERY EVENING     Cardiovascular:  Angiotensin Receptor Blockers Failed - 11/12/2022  9:59 AM      Failed - Cr in normal range and within 180 days    Creatinine  Date Value Ref Range Status  10/31/2022 1.12 (H) 0.44 - 1.00 mg/dL Final         Failed - K in normal range and within 180 days    Potassium  Date Value Ref Range Status  10/31/2022 3.2 (L) 3.5 - 5.1 mmol/L Final         Failed - Last BP in normal range    BP Readings from Last 1 Encounters:  10/31/22 (!) 180/65         Passed - Patient is not pregnant      Passed - Valid encounter within last 6 months    Recent Outpatient Visits           2 months ago Type 2 diabetes mellitus without complication, with long-term current use of insulin (HCC)   [place] [facility], [facility]   6 months ago Type 2 diabetes mellitus without complication, with long-term current use of insulin (HCC)   [place] [facility], MD   6 months ago Acute sinusitis, recurrence not specified, unspecified location   [facility] at [facility], [facility]   10 months ago Type 2 diabetes mellitus without complication, with long-term current use of insulin (HCC)   [place] [facility], MD   1 year ago Type 2 diabetes mellitus with other specified complication, with long-term current use of insulin (HCC)   [place] [facility], MD       Future Appointments             In 1 month Duanne Limerick, MD [facility]             furosemide (LASIX) 20  MG tablet [Pharmacy Med Name: furosemide 20 mg tablet] 30 tablet 1    Sig: TAKE ONE TABLET BY MOUTH EVERY MORNING     Cardiovascular:  Diuretics - Loop Failed - 11/12/2022  9:59 AM      Failed - K in normal range and within 180 days    Potassium  Date Value Ref Range Status  10/31/2022 3.2 (L) 3.5 - 5.1 mmol/L Final         Failed - Cr in normal range and within 180 days    Creatinine  Date Value Ref Range Status  10/31/2022 1.12 (H) 0.44 - 1.00 mg/dL Final         Failed - Mg Level in normal range and within 180 days    Magnesium  Date Value Ref Range Status  05/06/2021 2.0 1.7 - 2.4 mg/dL Final    Comment:    Performed at [facility], [address] [place], [place] [address]         Failed - Last BP in normal range    BP Readings from Last 1 Encounters:  10/31/22 (!) 180/65         Passed - Ca in normal range and within 180 days    Calcium  Date Value Ref Range Status  10/31/2022 9.0 8.9 - 10.3 mg/dL Final   Calcium, Total (PTH)  Date Value Ref Range Status  05/02/2021 11.0 (H) 8.7 - 10.3 mg/dL Final         Passed - Na in normal range and within 180 days    Sodium  Date Value Ref Range Status  10/31/2022 135 135 - 145 mmol/L Final  02/01/2021 138 134 - 144 mmol/L Final         Passed - Cl in normal range and within 180 days    Chloride  Date Value Ref Range Status  10/31/2022 103 98 - 111 mmol/L Final         Passed - Valid encounter within last 6 months    Recent Outpatient Visits           2 months ago Type 2 diabetes mellitus without complication, with long-term current use of insulin (HCC)   [place] [facility] at [facility], MD   6 months ago Type 2 diabetes mellitus without complication, with long-term current use of insulin (HCC)   [place] [facility], MD   6 months ago Acute sinusitis, recurrence not specified, unspecified location    [facility] at [facility], MD   10 months ago Type 2 diabetes mellitus without complication, with long-term current use of insulin (HCC)   [place] [facility], MD   1 year ago Type 2 diabetes mellitus with other specified complication, with long-term current use of insulin [facility])    Primary Care & Sports Medicine at [facility], [facility]       Future Appointments             In 1 month Duanne Limerick, MD [facility] at [facility], [facility]

## 2022-11-13 NOTE — Telephone Encounter (Signed)
Medication was refilled 11/13/22, duplicate request.E-Prescribing Status: Receipt confirmed by pharmacy (11/13/2022 12:14 PM EDT).  Requested Prescriptions  Pending Prescriptions Disp Refills   furosemide (LASIX) 20 MG tablet [Pharmacy Med Name: FUROSEMIDE 20 MG TABS 20 Tablet] 30 tablet 10    Sig: TAKE 1 TABLET BY MOUTH EVERY MORNING *REFILL REQUEST*     Cardiovascular:  Diuretics - Loop Failed - 11/12/2022  2:16 PM      Failed - K in normal range and within 180 days    Potassium  Date Value Ref Range Status  10/31/2022 3.2 (L) 3.5 - 5.1 mmol/L Final         Failed - Cr in normal range and within 180 days    Creatinine  Date Value Ref Range Status  10/31/2022 1.12 (H) 0.44 - 1.00 mg/dL Final         Failed - Mg Level in normal range and within 180 days    Magnesium  Date Value Ref Range Status  05/06/2021 2.0 1.7 - 2.4 mg/dL Final    Comment:    Performed at [facility], [address] [place], [place] [address]         Failed - Last BP in normal range    BP Readings from Last 1 Encounters:  10/31/22 (!) 180/65         Passed - Ca in normal range and within 180 days    Calcium  Date Value Ref Range Status  10/31/2022 9.0 8.9 - 10.3 mg/dL Final   Calcium, Total (PTH)  Date Value Ref Range Status  05/02/2021 11.0 (H) 8.7 - 10.3 mg/dL Final         Passed - Na in normal range and within 180 days    Sodium  Date Value Ref Range Status  10/31/2022 135 135 - 145 mmol/L Final  02/01/2021 138 134 - 144 mmol/L Final         Passed - Cl in normal range and within 180 days    Chloride  Date Value Ref Range Status  10/31/2022 103 98 - 111 mmol/L Final         Passed - Valid encounter within last 6 months    Recent Outpatient Visits           2 months ago Type 2 diabetes mellitus without complication, with long-term current use of insulin (HCC)   [place] [facility] at [facility], MD   6 months ago Type 2  diabetes mellitus without complication, with long-term current use of insulin (HCC)   [place] [facility], [facility]   6 months ago Acute sinusitis, recurrence not specified, unspecified location   [facility] at [facility], MD   10 months ago Type 2 diabetes mellitus without complication, with long-term current use of insulin (HCC)   [place] [facility], [facility]   1 year ago Type 2 diabetes mellitus with other specified complication, with long-term current use of insulin [facility])   [place] [facility], MD       Future Appointments             In 1 month Duanne Limerick, MD [facility] at [facility], [facility]  losartan (COZAAR) 25 MG tablet [Pharmacy Med Name: LOSARTAN 25MG  TABLET 25 Tablet] 30 tablet 10    Sig: TAKE 1 TABLET BY MOUTH EACH EVENING *REFILL REQUEST*     Cardiovascular:  Angiotensin Receptor Blockers Failed - 11/12/2022  2:16 PM      Failed - Cr in normal range and within 180 days    Creatinine  Date Value Ref Range Status  10/31/2022 1.12 (H) 0.44 - 1.00 mg/dL Final         Failed - K in normal range and within 180 days    Potassium  Date Value Ref Range Status  10/31/2022 3.2 (L) 3.5 - 5.1 mmol/L Final         Failed - Last BP in normal range    BP Readings from Last 1 Encounters:  10/31/22 (!) 180/65         Passed - Patient is not pregnant      Passed - Valid encounter within last 6 months    Recent Outpatient Visits           2 months ago Type 2 diabetes mellitus without complication, with long-term current use of insulin (HCC)   [place] [facility] at [facility], MD   6 months ago Type 2 diabetes mellitus without complication, with long-term current  use of insulin (HCC)   [place] [facility], MD   6 months ago Acute sinusitis, recurrence not specified, unspecified location   [facility] at [facility], MD   10 months ago Type 2 diabetes mellitus without complication, with long-term current use of insulin (HCC)   [place] [facility], MD   1 year ago Type 2 diabetes mellitus with other specified complication, with long-term current use of insulin [facility])    Primary Care & Sports Medicine at [facility], [facility]       Future Appointments             In 1 month Duanne Limerick, MD [facility] at [facility], [facility]

## 2022-11-14 ENCOUNTER — Other Ambulatory Visit: Payer: Self-pay | Admitting: Family Medicine

## 2022-11-14 ENCOUNTER — Other Ambulatory Visit: Payer: Self-pay | Admitting: Internal Medicine

## 2022-11-14 ENCOUNTER — Encounter: Payer: Self-pay | Admitting: Internal Medicine

## 2022-11-14 DIAGNOSIS — K219 Gastro-esophageal reflux disease without esophagitis: Secondary | ICD-10-CM

## 2022-11-14 DIAGNOSIS — C9000 Multiple myeloma not having achieved remission: Secondary | ICD-10-CM

## 2022-11-14 NOTE — Telephone Encounter (Signed)
Requested Prescriptions  Refused Prescriptions Disp Refills   pantoprazole (PROTONIX) 40 MG tablet [Pharmacy Med Name: PANTOPRAZOLE DR 40MG  TAB 40 Tablet] 30 tablet 10    Sig: TAKE 1 TABLET BY MOUTH ONCE DAILY     Gastroenterology: Proton Pump Inhibitors Passed - 11/14/2022  5:03 PM      Passed - Valid encounter within last 12 months    Recent Outpatient Visits           2 months ago Type 2 diabetes mellitus without complication, with long-term current use of insulin (HCC)   [place] [facility] at [facility], MD   6 months ago Type 2 diabetes mellitus without complication, with long-term current use of insulin (HCC)   [place] [facility], MD   6 months ago Acute sinusitis, recurrence not specified, unspecified location   [facility] at [facility], MD   10 months ago Type 2 diabetes mellitus without complication, with long-term current use of insulin (HCC)   [place] [facility], MD   1 year ago Type 2 diabetes mellitus with other specified complication, with long-term current use of insulin [facility])   [place] [facility], MD       Future Appointments             In 1 month Duanne Limerick, MD [facility] Medicine at [facility], [facility]

## 2022-11-14 NOTE — Telephone Encounter (Signed)
Requested medication (s) are due for refill today:   Requested medication (s) are on the active medication list: Yes  Last refill:  09/16/22  Future visit scheduled: Yes  Notes to clinic:  Pharmacy requests 1 year supply.    Requested Prescriptions  Pending Prescriptions Disp Refills   pantoprazole (PROTONIX) 40 MG tablet [Pharmacy Med Name: PANTOPRAZOLE DR 40MG  TAB 40 Tablet] 30 tablet 10    Sig: TAKE 1 TABLET BY MOUTH ONCE DAILY     Gastroenterology: Proton Pump Inhibitors Passed - 11/13/2022  7:25 PM      Passed - Valid encounter within last 12 months    Recent Outpatient Visits           2 months ago Type 2 diabetes mellitus without complication, with long-term current use of insulin (HCC)   [place] [facility] at [facility], MD   6 months ago Type 2 diabetes mellitus without complication, with long-term current use of insulin (HCC)   [place] [facility], MD   6 months ago Acute sinusitis, recurrence not specified, unspecified location   [facility] at [facility], MD   10 months ago Type 2 diabetes mellitus without complication, with long-term current use of insulin (HCC)   [place] [facility], MD   1 year ago Type 2 diabetes mellitus with other specified complication, with long-term current use of insulin [facility])   [place] [facility] at [facility], MD       Future Appointments             In 1 month Duanne Limerick, MD [facility] Primary [facility] at [facility], [facility]

## 2022-11-18 ENCOUNTER — Encounter: Payer: Self-pay | Admitting: Internal Medicine

## 2022-11-19 ENCOUNTER — Ambulatory Visit (INDEPENDENT_AMBULATORY_CARE_PROVIDER_SITE_OTHER): Payer: Medicare Other

## 2022-11-19 ENCOUNTER — Ambulatory Visit: Payer: Medicare Other

## 2022-11-19 DIAGNOSIS — Z0000 Encounter for general adult medical examination without abnormal findings: Secondary | ICD-10-CM | POA: Diagnosis not present

## 2022-11-19 NOTE — Patient Instructions (Addendum)
Cristina Morgan , Thank you for taking time to come for your Medicare Wellness Visit. I appreciate your ongoing commitment to your health goals. Please review the following plan we discussed and let me know if I can assist you in the future.   Referrals/Orders/Follow-Ups/Clinician Recommendations: none  This is a list of the screening recommended for you and due dates:  Health Maintenance  Topic Date Due   Eye exam for diabetics  05/15/2020   Flu Shot  10/16/2022   COVID-19 Vaccine (6 - 2023-24 season) 11/16/2022   Zoster (Shingles) Vaccine (1 of 2) 12/10/2022*   Hemoglobin A1C  03/11/2023   Complete foot exam   09/09/2023   Medicare Annual Wellness Visit  11/19/2023   DEXA scan (bone density measurement)  04/08/2024   Pneumonia Vaccine  Completed   HPV Vaccine  Aged Out   DTaP/Tdap/Td vaccine  Discontinued  *Topic was postponed. The date shown is not the original due date.    Advanced directives: (ACP Link)Information on Advanced Care Planning can be found at [facility] Directives Advance Health Care Directives ([email or web address]   Next Medicare Annual Wellness Visit scheduled for next year: Yes   11/25/23 @ 10:15 am by phone

## 2022-11-19 NOTE — Progress Notes (Signed)
Subjective:   Cristina Morgan is a 88 y.o. female who presents for Medicare Annual (Subsequent) preventive examination.  Visit Complete: Virtual  I connected with  [name] on 11/19/22 by a audio enabled telemedicine application and verified that I am speaking with the correct person using two identifiers.  Patient Location: Home  Provider Location: Office/Clinic  I discussed the limitations of evaluation and management by telemedicine. The patient expressed understanding and agreed to proceed.  Vital Signs: Unable to obtain new vitals due to this being a telehealth visit.  Review of Systems     Cardiac Risk Factors include: advanced age (>43men, >65 women);diabetes mellitus;hypertension     Objective:    Today's Vitals   11/19/22 1303  PainSc: 0-No pain   There is no height or weight on file to calculate BMI.     11/19/2022    1:12 PM 10/31/2022   10:03 AM 10/03/2022   10:44 AM 08/29/2022    9:01 AM 08/01/2022    1:08 PM 07/04/2022   10:04 AM 06/06/2022    1:17 PM  Advanced Directives  Does Patient Have a Medical Advance Directive? No No No No No No No  Would patient like information on creating a medical advance directive? No - Patient declined No - Patient declined No - Patient declined No - Patient declined No - Patient declined No - Patient declined No - Patient declined    Current Medications (verified) Outpatient Encounter Medications as of 11/19/2022  Medication Sig   Accu-Chek Softclix Lancets lancets Use as instructed   acetaminophen (TYLENOL) 325 MG tablet Take 325 mg by mouth 2 (two) times daily. START 2 days prior to chemo infusion. Take For 2 days; Do NOT take on the day of infusion   acyclovir (ZOVIRAX) 400 MG tablet TAKE 1 TABLET BY MOUTH TWICE DAILY   aspirin EC 81 MG tablet Take 81 mg by mouth daily. Swallow whole.   BD PEN NEEDLE NANO 2ND GEN 32G X 4 MM MISC USE 1  ONCE DAILY   Blood Glucose Monitoring Suppl (ACCU-CHEK GUIDE) w/Device KIT USE AS  DIRECTED TO CHECK GLUCOSE   Calcium Carbonate-Vit D-Min (CALCIUM 1200 PO) Take 1 capsule by mouth daily.   carvedilol (COREG) 3.125 MG tablet TAKE ONE TABLET BY MOUTH TWICE DAILY   cholecalciferol (VITAMIN D3) 25 MCG (1000 UNIT) tablet Take 1,000 Units by mouth daily.   dexamethasone (DECADRON) 4 MG tablet Take 5 tablets (20 mg total) by mouth as directed. Take one hour before monthly Darzalex injections.   diphenoxylate-atropine (LOMOTIL) 2.5-0.025 MG tablet Take 1 tablet by mouth 4 times daily as needed for diarrhea or loose stools. Take along with immodium.   furosemide (LASIX) 20 MG tablet TAKE ONE TABLET BY MOUTH EVERY MORNING   Glucerna (GLUCERNA) LIQD Take 237 mLs by mouth.   glucose blood (ACCU-CHEK GUIDE) test strip Use as instructed   hydrOXYzine (ATARAX) 10 MG tablet Take 1 tablet (10 mg total) by mouth at bedtime as needed.   Infant Foods (GOOD START 2 ESSENTIALS/IRON PO) Take 1 tablet by mouth daily.   insulin glargine (LANTUS SOLOSTAR) 100 UNIT/ML Solostar Pen Inject 10 Units into the skin daily. 6 units every day except Wed and Thursday- 8 units   lenalidomide (REVLIMID) 10 MG capsule Take 1 capsule (10 mg total) by mouth daily. Take for 21 days, then hold for 7 days. Repeat every 28 days.   losartan (COZAAR) 25 MG tablet TAKE ONE TABLET BY MOUTH EVERY EVENING  losartan (COZAAR) 50 MG tablet Take 1 tablet (50 mg total) by mouth every evening.   lovastatin (MEVACOR) 40 MG tablet TAKE ONE TABLET BY MOUTH EVERY evening   metFORMIN (GLUCOPHAGE) 1000 MG tablet Take 1 tablet (1,000 mg total) by mouth 2 (two) times daily.   montelukast (SINGULAIR) 10 MG tablet STARTING 2 DAYS BEFORE INFUSION, TAKE 1 TABLET BY MOUTH ONCE DAILY *DO NOT TAKE ON DAY OF INFUSION*   ondansetron (ZOFRAN) 8 MG tablet One pill every 8 hours as needed for nausea/vomitting.   pantoprazole (PROTONIX) 40 MG tablet TAKE ONE TABLET BY MOUTH ONCE DAILY   prochlorperazine (COMPAZINE) 10 MG tablet Take 1 tablet (10 mg  total) by mouth every 6 (six) hours as needed for nausea or vomiting.   sertraline (ZOLOFT) 25 MG tablet TAKE ONE TABLET BY MOUTH ONCE DAILY   triamcinolone (NASACORT) 55 MCG/ACT AERO nasal inhaler 2 sprays 2 (two) times daily.   No facility-administered encounter medications on file as of 11/19/2022.    Allergies (verified) Latex and Penicillins   History: Past Medical History:  Diagnosis Date   Anemia    CAD (coronary artery disease)    Cataracts, bilateral    Closed compression fracture of first lumbar vertebra (HCC)    Closed compression fracture of second lumbar vertebra (HCC)    Diabetes mellitus without complication (HCC)    High cholesterol    History of pneumonia 2010   [facility]   History of uterine fibroid    Hypercalcemia    Hyperlipidemia    Hypertension    Lytic bone lesions on xray    Multiple myeloma (HCC)    Osteoporosis    Primary osteoarthritis of right knee    Past Surgical History:  Procedure Laterality Date   CATARACT EXTRACTION Bilateral 2014   COLONOSCOPY  2011   cleared for 5 yrs- [facility]   ECTOPIC PREGNANCY SURGERY     Family History  Problem Relation Age of Onset   Cancer Mother        breast   Heart disease Mother    Diabetes Father    Heart disease Father    Heart attack Father    Heart attack Brother    Diabetes Brother    Social History   Socioeconomic History   Marital status: Married    Spouse name: Not on file   Number of children: 2   Years of education: Not on file   Highest education level: 12th grade  Occupational History   Occupation: Retired  Tobacco Use   Smoking status: Never    Passive exposure: Never   Smokeless tobacco: Never   Tobacco comments:    smoking cessation materials not required  Vaping Use   Vaping status: Never Used  Substance and Sexual Activity   Alcohol use: No   Drug use: No   Sexual activity: Not Currently  Other Topics Concern   Not on file  Social History Narrative   [place]  with husband; drives. Never smoked; no alcohol. Daughters live close.    Social Determinants of Health   Financial Resource Strain: Low Risk  (11/19/2022)   Overall Financial Resource Strain (CARDIA)    Difficulty of Paying Living Expenses: Not very hard  Food Insecurity: No Food Insecurity (11/19/2022)   Hunger Vital Sign    Worried About Running Out of Food in the Last Year: Never true    Ran Out of Food in the Last Year: Never true  Transportation Needs: No Transportation Needs (  11/19/2022)   PRAPARE - Administrator, Civil Service (Medical): No    Lack of Transportation (Non-Medical): No  Physical Activity: Sufficiently Active (11/19/2022)   Exercise Vital Sign    Days of Exercise per Week: 5 days    Minutes of Exercise per Session: 30 min  Stress: No Stress Concern Present (11/19/2022)   [employer] of Occupational Health - Occupational Stress Questionnaire    Feeling of Stress : Not at all  Social Connections: Moderately Integrated (11/19/2022)   Social Connection and Isolation Panel [NHANES]    Frequency of Communication with Friends and Family: Twice a week    Frequency of Social Gatherings with Friends and Family: More than three times a week    Attends Religious Services: More than 4 times per year    Active Member of [employer] or Organizations: No    Attends Engineer, structural: Never    Marital Status: Married    Tobacco Counseling Counseling given: Not Answered Tobacco comments: smoking cessation materials not required   Clinical Intake:  Pre-visit preparation completed: Yes  Pain : No/denies pain Pain Score: 0-No pain     Nutritional Risks: None Diabetes: Yes CBG done?: No Did pt. bring in CBG monitor from home?: No  How often do you need to have someone help you when you read instructions, pamphlets, or other written materials from your doctor or pharmacy?: 1 - Never  Interpreter Needed?: No  Information entered by :: Kennedy Bucker,  LPN   Activities of Daily Living    11/19/2022    1:13 PM  In your present state of health, do you have any difficulty performing the following activities:  Hearing? 1  Vision? 0  Difficulty concentrating or making decisions? 0  Walking or climbing stairs? 0  Dressing or bathing? 0  Doing errands, shopping? 0  Preparing Food and eating ? N  Using the Toilet? N  In the past six months, have you accidently leaked urine? N  Do you have problems with loss of bowel control? N  Managing your Medications? N  Managing your Finances? N  Housekeeping or managing your Housekeeping? Y    Patient Care Team: Duanne Limerick, MD as PCP - General (Family Medicine) Lajean Manes, [facility] (Inactive) (Pharmacist) Earna Coder, MD as Consulting Physician (Oncology)  Indicate any recent Medical Services you may have received from other than [facility] providers in the past year (date may be approximate).     Assessment:   This is a routine wellness examination for Reighn.  Hearing/Vision screen Hearing Screening - Comments:: Wears aids Vision Screening - Comments:: Readers- Dr.Bell  Dietary issues and exercise activities discussed:     Goals Addressed             This Visit's Progress    DIET - EAT MORE FRUITS AND VEGETABLES         Depression Screen    11/19/2022    1:10 PM 09/09/2022    2:39 PM 05/02/2022    2:09 PM 04/21/2022    3:59 PM 12/30/2021    2:38 PM 11/13/2021   11:32 AM 08/30/2021    2:10 PM  PHQ 2/9 Scores  PHQ - 2 Score 0 0 0 0 0 0 1  PHQ- 9 Score 0 0 0 0 0  2    Fall Risk    11/19/2022    1:12 PM 09/09/2022    2:39 PM 05/02/2022    2:09 PM 04/21/2022  3:59 PM 12/30/2021    2:37 PM  Fall Risk   Falls in the past year? 0 0 0 0 0  Number falls in past yr: 0 0 0 0 0  Injury with Fall? 0 0 0 0 0  Risk for fall due to : No Fall Risks Impaired balance/gait Impaired balance/gait No Fall Risks Impaired balance/gait  Follow up Falls prevention discussed;Falls  evaluation completed Falls evaluation completed Falls evaluation completed;Falls prevention discussed Falls evaluation completed Falls evaluation completed;Falls prevention discussed    MEDICARE RISK AT HOME: Medicare Risk at Home Any stairs in or around the home?: No If so, are there any without handrails?: No Home free of loose throw rugs in walkways, pet beds, electrical cords, etc?: Yes Adequate lighting in your home to reduce risk of falls?: Yes Life alert?: No Use of a cane, walker or w/c?: Yes (cane when outside) Grab bars in the bathroom?: Yes Shower chair or bench in shower?: Yes Elevated toilet seat or a handicapped toilet?: No  TIMED UP AND GO:  Was the test performed?  No    Cognitive Function:        11/19/2022    1:14 PM 11/13/2021   11:37 AM 08/03/2019    3:00 PM 09/07/2017    9:24 AM  6CIT Screen  What Year? 0 points 0 points 0 points 0 points  What month? 0 points 0 points 0 points 0 points  What time? 0 points 0 points 0 points 0 points  Count back from 20 0 points 2 points 0 points 0 points  Months in reverse 4 points 0 points 0 points 0 points  Repeat phrase 2 points 6 points 4 points 4 points  Total Score 6 points 8 points 4 points 4 points    Immunizations Immunization History  Administered Date(s) Administered   Fluad Quad(high Dose 65+) 01/25/2019, 12/02/2019, 12/12/2020, 12/30/2021   Influenza, High Dose Seasonal PF 01/07/2017, 01/25/2018   Influenza,inj,Quad PF,6+ Mos 01/03/2015, 12/27/2015   Moderna Sars-Covid-2 Vaccination 05/11/2019, 06/08/2019, 07/05/2019, 02/17/2020, 11/12/2020   Pneumococcal Conjugate-13 01/07/2017   Pneumococcal Polysaccharide-23 01/25/2019    TDAP status: Due, Education has been provided regarding the importance of this vaccine. Advised may receive this vaccine at local pharmacy or Health Dept. Aware to provide a copy of the vaccination record if obtained from local pharmacy or Health Dept. Verbalized acceptance and  understanding.  Flu Vaccine status: Up to date  Pneumococcal vaccine status: Up to date  Covid-19 vaccine status: Completed vaccines  Qualifies for Shingles Vaccine? Yes   Zostavax completed No   Shingrix Completed?: No.    Education has been provided regarding the importance of this vaccine. Patient has been advised to call insurance company to determine out of pocket expense if they have not yet received this vaccine. Advised may also receive vaccine at local pharmacy or Health Dept. Verbalized acceptance and understanding.  Screening Tests Health Maintenance  Topic Date Due   OPHTHALMOLOGY EXAM  05/15/2020   INFLUENZA VACCINE  10/16/2022   COVID-19 Vaccine (6 - 2023-24 season) 11/16/2022   Zoster Vaccines- Shingrix (1 of 2) 12/10/2022 (Originally 04/06/1953)   HEMOGLOBIN A1C  03/11/2023   FOOT EXAM  09/09/2023   Medicare Annual Wellness (AWV)  11/19/2023   DEXA SCAN  04/08/2024   Pneumonia Vaccine 36+ Years old  Completed   HPV VACCINES  Aged Out   DTaP/Tdap/Td  Discontinued    Health Maintenance  Health Maintenance Due  Topic Date Due   OPHTHALMOLOGY EXAM  05/15/2020   INFLUENZA VACCINE  10/16/2022   COVID-19 Vaccine (6 - 2023-24 season) 11/16/2022    Colorectal cancer screening: No longer required.   Mammogram status: No longer required due to age.   Lung Cancer Screening: (Low Dose CT Chest recommended if Age 65-80 years, 20 pack-year currently smoking OR have quit w/in 15years.) does not qualify.    Additional Screening:  Hepatitis C Screening: does not qualify; Completed no  Vision Screening: Recommended annual ophthalmology exams for early detection of glaucoma and other disorders of the eye. Is the patient up to date with their annual eye exam?  Yes  Who is the provider or what is the name of the office in which the patient attends annual eye exams? Dr.Bell If pt is not established with a provider, would they like to be referred to a provider to establish  care? No .   Dental Screening: Recommended annual dental exams for proper oral hygiene  Diabetic Foot Exam: Diabetic Foot Exam: Completed 09/09/22  Community Resource Referral / Chronic Care Management: CRR required this visit?  No   CCM required this visit?  No     Plan:     I have personally reviewed and noted the following in the patient's chart:   Medical and social history Use of alcohol, tobacco or illicit drugs  Current medications and supplements including opioid prescriptions. Patient is not currently taking opioid prescriptions. Functional ability and status Nutritional status Physical activity Advanced directives List of other physicians Hospitalizations, surgeries, and ER visits in previous 12 months Vitals Screenings to include cognitive, depression, and falls Referrals and appointments  In addition, I have reviewed and discussed with patient certain preventive protocols, quality metrics, and best practice recommendations. A written personalized care plan for preventive services as well as general preventive health recommendations were provided to patient.     Hal Hope, LPN   07/19/979   After Visit Summary: (MyChart) Due to this being a telephonic visit, the after visit summary with patients personalized plan was offered to patient via MyChart   Nurse Notes: none

## 2022-11-20 ENCOUNTER — Ambulatory Visit: Payer: Medicare Other

## 2022-11-20 ENCOUNTER — Other Ambulatory Visit: Payer: Self-pay

## 2022-11-28 ENCOUNTER — Inpatient Hospital Stay: Payer: Medicare Other

## 2022-11-28 ENCOUNTER — Encounter: Payer: Self-pay | Admitting: Internal Medicine

## 2022-11-28 ENCOUNTER — Inpatient Hospital Stay (HOSPITAL_BASED_OUTPATIENT_CLINIC_OR_DEPARTMENT_OTHER): Payer: Medicare Other | Admitting: Internal Medicine

## 2022-11-28 ENCOUNTER — Inpatient Hospital Stay: Payer: Medicare Other | Attending: Nurse Practitioner

## 2022-11-28 VITALS — BP 106/48 | HR 60 | Temp 97.6°F | Ht 66.0 in | Wt 130.2 lb

## 2022-11-28 DIAGNOSIS — D509 Iron deficiency anemia, unspecified: Secondary | ICD-10-CM | POA: Insufficient documentation

## 2022-11-28 DIAGNOSIS — Z794 Long term (current) use of insulin: Secondary | ICD-10-CM | POA: Insufficient documentation

## 2022-11-28 DIAGNOSIS — Z7984 Long term (current) use of oral hypoglycemic drugs: Secondary | ICD-10-CM | POA: Insufficient documentation

## 2022-11-28 DIAGNOSIS — Z7982 Long term (current) use of aspirin: Secondary | ICD-10-CM | POA: Insufficient documentation

## 2022-11-28 DIAGNOSIS — I2510 Atherosclerotic heart disease of native coronary artery without angina pectoris: Secondary | ICD-10-CM | POA: Diagnosis not present

## 2022-11-28 DIAGNOSIS — R600 Localized edema: Secondary | ICD-10-CM

## 2022-11-28 DIAGNOSIS — Z7961 Long term (current) use of immunomodulator: Secondary | ICD-10-CM | POA: Diagnosis not present

## 2022-11-28 DIAGNOSIS — C9000 Multiple myeloma not having achieved remission: Secondary | ICD-10-CM

## 2022-11-28 DIAGNOSIS — Z79624 Long term (current) use of inhibitors of nucleotide synthesis: Secondary | ICD-10-CM | POA: Insufficient documentation

## 2022-11-28 DIAGNOSIS — E559 Vitamin D deficiency, unspecified: Secondary | ICD-10-CM | POA: Diagnosis not present

## 2022-11-28 DIAGNOSIS — Z79899 Other long term (current) drug therapy: Secondary | ICD-10-CM | POA: Insufficient documentation

## 2022-11-28 DIAGNOSIS — E119 Type 2 diabetes mellitus without complications: Secondary | ICD-10-CM | POA: Diagnosis not present

## 2022-11-28 DIAGNOSIS — E876 Hypokalemia: Secondary | ICD-10-CM | POA: Diagnosis not present

## 2022-11-28 DIAGNOSIS — Z5112 Encounter for antineoplastic immunotherapy: Secondary | ICD-10-CM | POA: Insufficient documentation

## 2022-11-28 DIAGNOSIS — I509 Heart failure, unspecified: Secondary | ICD-10-CM

## 2022-11-28 LAB — CBC WITH DIFFERENTIAL/PLATELET
Abs Immature Granulocytes: 0.01 10*3/uL (ref 0.00–0.07)
Basophils Absolute: 0 10*3/uL (ref 0.0–0.1)
Basophils Relative: 1 %
Eosinophils Absolute: 0.1 10*3/uL (ref 0.0–0.5)
Eosinophils Relative: 4 %
HCT: 30 % — ABNORMAL LOW (ref 36.0–46.0)
Hemoglobin: 9.4 g/dL — ABNORMAL LOW (ref 12.0–15.0)
Immature Granulocytes: 0 %
Lymphocytes Relative: 33 %
Lymphs Abs: 1.3 10*3/uL (ref 0.7–4.0)
MCH: 32.2 pg (ref 26.0–34.0)
MCHC: 31.3 g/dL (ref 30.0–36.0)
MCV: 102.7 fL — ABNORMAL HIGH (ref 80.0–100.0)
Monocytes Absolute: 0.5 10*3/uL (ref 0.1–1.0)
Monocytes Relative: 12 %
Neutro Abs: 1.9 10*3/uL (ref 1.7–7.7)
Neutrophils Relative %: 50 %
Platelets: 190 10*3/uL (ref 150–400)
RBC: 2.92 MIL/uL — ABNORMAL LOW (ref 3.87–5.11)
RDW: 14.4 % (ref 11.5–15.5)
WBC: 3.8 10*3/uL — ABNORMAL LOW (ref 4.0–10.5)
nRBC: 0 % (ref 0.0–0.2)

## 2022-11-28 LAB — BRAIN NATRIURETIC PEPTIDE: B Natriuretic Peptide: 108.2 pg/mL — ABNORMAL HIGH (ref 0.0–100.0)

## 2022-11-28 LAB — CMP (CANCER CENTER ONLY)
ALT: 12 U/L (ref 0–44)
AST: 19 U/L (ref 15–41)
Albumin: 3.5 g/dL (ref 3.5–5.0)
Alkaline Phosphatase: 47 U/L (ref 38–126)
Anion gap: 5 (ref 5–15)
BUN: 12 mg/dL (ref 8–23)
CO2: 24 mmol/L (ref 22–32)
Calcium: 8.7 mg/dL — ABNORMAL LOW (ref 8.9–10.3)
Chloride: 105 mmol/L (ref 98–111)
Creatinine: 0.97 mg/dL (ref 0.44–1.00)
GFR, Estimated: 57 mL/min — ABNORMAL LOW (ref >60)
Glucose, Bld: 110 mg/dL — ABNORMAL HIGH (ref 70–99)
Potassium: 3.6 mmol/L (ref 3.5–5.1)
Sodium: 134 mmol/L — ABNORMAL LOW (ref 135–145)
Total Bilirubin: 0.4 mg/dL (ref 0.3–1.2)
Total Protein: 5.9 g/dL — ABNORMAL LOW (ref 6.5–8.1)

## 2022-11-28 MED ORDER — DIPHENHYDRAMINE HCL 25 MG PO CAPS: 50.0000 mg | ORAL_CAPSULE | Freq: Once | ORAL | Status: DC

## 2022-11-28 MED ORDER — ACETAMINOPHEN 325 MG PO TABS: 650.0000 mg | ORAL_TABLET | Freq: Once | ORAL | Status: DC

## 2022-11-28 MED ORDER — SODIUM CHLORIDE 0.9 % IV SOLN
Freq: Once | INTRAVENOUS | Status: AC
  Filled 2022-11-28: qty 250

## 2022-11-28 MED ORDER — FUROSEMIDE 20 MG PO TABS: 20.0000 mg | ORAL_TABLET | Freq: Every morning | ORAL | 1 refills | Status: DC

## 2022-11-28 MED ORDER — SODIUM CHLORIDE 0.9 % IV SOLN
200.0000 mg | Freq: Once | INTRAVENOUS | Status: AC
  Administered 2022-11-28: 200 mg via INTRAVENOUS
  Filled 2022-11-28: qty 200

## 2022-11-28 MED ORDER — DARATUMUMAB-HYALURONIDASE-FIHJ 1800-30000 MG-UT/15ML ~~LOC~~ SOLN
1800.0000 mg | Freq: Once | SUBCUTANEOUS | Status: AC
  Administered 2022-11-28: 1800 mg via SUBCUTANEOUS
  Filled 2022-11-28: qty 15

## 2022-11-28 NOTE — Progress Notes (Signed)
Drinks glucerna 1-3 per day.  SOB with exertion.  Refill for furosemide pending.

## 2022-11-28 NOTE — Progress Notes (Signed)
[facility] CONSULT NOTE  Patient Care Team: Duanne Limerick, MD as PCP - General (Family Medicine) Lajean Manes, [facility] (Inactive) (Pharmacist) Earna Coder, MD as Consulting Physician (Oncology)  CHIEF COMPLAINTS/PURPOSE OF CONSULTATION: Multiple myeloma  Oncology History Overview Note  # MULTIPLE MYELOMA  [FEB 2023-hypercalcemia status changes]-Active [bone lesions; hypercalcemia; anemia; No renal insuffiencey;Bone marrow-32% plasma cell-   STANDARD Cytogenetics].FEB 2023- S/p dexamethasone 20 mg q day x4.   # REV [15 mg -3 week-On and 1 week-OFF]-DARA; May 2023-discontinued rev 15 diarrhea/hypotension  # MAY 2023- dara-Rev 10 mg 3w/1w   # FEB 2023-hypercalcemia [[facility]-status post calcitonin; bisphosphonate]  BONE MARROW, ASPIRATE, CLOT, CORE:  -Hypercellular bone marrow with plasma cell neoplasm  -See comment   PERIPHERAL BLOOD:  -Normocytic-normochromic anemia   COMMENT:   The bone marrow is hypercellular for age with increased number of  atypical plasma cells representing 32% of all cells in the aspirate  associated with interstitial infiltrates and numerous variably sized  clusters in the clot/biopsy sections.  The plasma cells display weak  kappa light chain restriction consistent with plasma cell neoplasm.  Correlation with cytogenetic and FISH studies is recommended   MICROSCOPIC DESCRIPTION:   PERIPHERAL BLOOD SMEAR: The red blood cells display mild  anisopoikilocytosis with mild polychromasia.  The white blood cells are  normal number with scattered hypogranular neutrophils. An occasional  myelocyte and large atypical mononuclear cells are seen on scan. The  platelets are normal in number.    Multiple myeloma not having achieved remission (HCC)  05/15/2021 Initial Diagnosis   Multiple myeloma not having achieved remission (HCC)   06/27/2021 - 10/17/2021 Chemotherapy   Patient is on Treatment Plan : MYELOMA Daratumumab SQ + Lenalidomide +  Dexamethasone (DaraRd) q28d     06/27/2021 -  Chemotherapy   Patient is on Treatment Plan : MYELOMA RELAPSED REFRACTORY Daratumumab SQ + Lenalidomide + Dexamethasone (DaraRd) q28d     07/11/2021 Cancer Staging   Staging form: Plasma Cell Myeloma and Plasma Cell Disorders, AJCC 8th Edition - Clinical: Beta-2-microglobulin (mg/L): 2.6, Albumin (g/dL): 3.9, ISS: Stage I - Signed by Earna Coder, MD on 07/11/2021 Stage prefix: Initial diagnosis Beta 2 microglobulin range (mg/L): Less than 3.5 Albumin range (g/dL): Greater than or equal to 3.5     HISTORY OF PRESENTING ILLNESS: pt in a wheel chair; accompanied by her daughter.   Cristina Morgan 88 y.o.  female with  multiple myeloma currently on Rev-Dex is proceed with Dara SQ today.    drinks glucerna 1-3 per day.   SOB with exertion; no worsening swelling in legs no cough. No worsening diarrhea.    Denies any nausea vomiting.  Denies any blood in stools or black-colored stools.  Patient states her back pain is improved.  Denies any worsening joint pains.   Review of Systems  Constitutional:  Positive for malaise/fatigue and weight loss. Negative for chills, diaphoresis and fever.  HENT:  Negative for nosebleeds and sore throat.   Eyes:  Negative for double vision.  Respiratory:  Negative for cough, hemoptysis, sputum production, shortness of breath and wheezing.   Cardiovascular:  Negative for chest pain, palpitations, orthopnea and leg swelling.  Gastrointestinal:  Negative for abdominal pain, blood in stool, constipation, diarrhea, heartburn, melena, nausea and vomiting.  Genitourinary:  Negative for dysuria, frequency and urgency.  Musculoskeletal:  Positive for back pain and joint pain.  Skin: Negative.  Negative for itching and rash.  Neurological:  Negative for dizziness, tingling, focal  weakness, weakness and headaches.  Endo/Heme/Allergies:  Does not bruise/bleed easily.  Psychiatric/Behavioral:  Negative for  depression. The patient is not nervous/anxious and does not have insomnia.     MEDICAL HISTORY:  Past Medical History:  Diagnosis Date   Anemia    CAD (coronary artery disease)    Cataracts, bilateral    Closed compression fracture of first lumbar vertebra (HCC)    Closed compression fracture of second lumbar vertebra (HCC)    Diabetes mellitus without complication (HCC)    High cholesterol    History of pneumonia 2010   [place]   History of uterine fibroid    Hypercalcemia    Hyperlipidemia    Hypertension    Lytic bone lesions on xray    Multiple myeloma (HCC)    Osteoporosis    Primary osteoarthritis of right knee     SURGICAL HISTORY: Past Surgical History:  Procedure Laterality Date   CATARACT EXTRACTION Bilateral 2014   COLONOSCOPY  2011   cleared for 5 yrs- [facility]   ECTOPIC PREGNANCY SURGERY      SOCIAL HISTORY: Social History   Socioeconomic History   Marital status: Married    Spouse name: Not on file   Number of children: 2   Years of education: Not on file   Highest education level: 12th grade  Occupational History   Occupation: Retired  Tobacco Use   Smoking status: Never    Passive exposure: Never   Smokeless tobacco: Never   Tobacco comments:    smoking cessation materials not required  Vaping Use   Vaping status: Never Used  Substance and Sexual Activity   Alcohol use: No   Drug use: No   Sexual activity: Not Currently  Other Topics Concern   Not on file  Social History Narrative   [place] with husband; drives. Never smoked; no alcohol. Daughters live close.    Social Determinants of Health   Financial Resource Strain: Low Risk  (11/19/2022)   Overall Financial Resource Strain (CARDIA)    Difficulty of Paying Living Expenses: Not very hard  Food Insecurity: No Food Insecurity (11/19/2022)   Hunger Vital Sign    Worried About Running Out of Food in the Last Year: Never true    Ran Out of Food in the Last Year: Never true   Transportation Needs: No Transportation Needs (11/19/2022)   PRAPARE - Administrator, Civil Service (Medical): No    Lack of Transportation (Non-Medical): No  Physical Activity: Sufficiently Active (11/19/2022)   Exercise Vital Sign    Days of Exercise per Week: 5 days    Minutes of Exercise per Session: 30 min  Stress: No Stress Concern Present (11/19/2022)   [employer] of Occupational Health - Occupational Stress Questionnaire    Feeling of Stress : Not at all  Social Connections: Moderately Integrated (11/19/2022)   Social Connection and Isolation Panel [NHANES]    Frequency of Communication with Friends and Family: Twice a week    Frequency of Social Gatherings with Friends and Family: More than three times a week    Attends Religious Services: More than 4 times per year    Active Member of [employer] or Organizations: No    Attends Banker Meetings: Never    Marital Status: Married  Catering manager Violence: Not At Risk (11/19/2022)   Humiliation, Afraid, Rape, and Kick questionnaire    Fear of Current or Ex-Partner: No    Emotionally Abused: No  Physically Abused: No    Sexually Abused: No    FAMILY HISTORY: Family History  Problem Relation Age of Onset   Cancer Mother        breast   Heart disease Mother    Diabetes Father    Heart disease Father    Heart attack Father    Heart attack Brother    Diabetes Brother     ALLERGIES:  is allergic to latex and penicillins.  MEDICATIONS:  Current Outpatient Medications  Medication Sig Dispense Refill   Accu-Chek Softclix Lancets lancets Use as instructed 100 each 0   acetaminophen (TYLENOL) 325 MG tablet Take 325 mg by mouth 2 (two) times daily. START 2 days prior to chemo infusion. Take For 2 days; Do NOT take on the day of infusion     acyclovir (ZOVIRAX) 400 MG tablet TAKE 1 TABLET BY MOUTH TWICE DAILY 60 tablet 10   aspirin EC 81 MG tablet Take 81 mg by mouth daily. Swallow whole.     BD PEN  NEEDLE NANO 2ND GEN 32G X 4 MM MISC USE 1  ONCE DAILY 100 each 0   Blood Glucose Monitoring Suppl (ACCU-CHEK GUIDE) w/Device KIT USE AS DIRECTED TO CHECK GLUCOSE     Calcium Carbonate-Vit D-Min (CALCIUM 1200 PO) Take 1 capsule by mouth daily.     carvedilol (COREG) 3.125 MG tablet TAKE ONE TABLET BY MOUTH TWICE DAILY 60 tablet 1   cholecalciferol (VITAMIN D3) 25 MCG (1000 UNIT) tablet Take 1,000 Units by mouth daily.     dexamethasone (DECADRON) 4 MG tablet Take 5 tablets (20 mg total) by mouth as directed. Take one hour before monthly Darzalex injections. 15 tablet 3   diphenoxylate-atropine (LOMOTIL) 2.5-0.025 MG tablet Take 1 tablet by mouth 4 times daily as needed for diarrhea or loose stools. Take along with immodium. 60 tablet 3   Glucerna (GLUCERNA) LIQD Take 237 mLs by mouth.     glucose blood (ACCU-CHEK GUIDE) test strip Use as instructed 100 each 0   hydrOXYzine (ATARAX) 10 MG tablet Take 1 tablet (10 mg total) by mouth at bedtime as needed. 90 tablet 1   Infant Foods (GOOD START 2 ESSENTIALS/IRON PO) Take 1 tablet by mouth daily.     insulin glargine (LANTUS SOLOSTAR) 100 UNIT/ML Solostar Pen Inject 10 Units into the skin daily. 6 units every day except Wed and Thursday- 8 units 15 mL 0   lenalidomide (REVLIMID) 10 MG capsule Take 1 capsule (10 mg total) by mouth daily. Take for 21 days, then hold for 7 days. Repeat every 28 days. 21 capsule 0   losartan (COZAAR) 25 MG tablet TAKE ONE TABLET BY MOUTH EVERY EVENING 90 tablet 0   losartan (COZAAR) 50 MG tablet Take 1 tablet (50 mg total) by mouth every evening. 90 tablet 0   lovastatin (MEVACOR) 40 MG tablet TAKE ONE TABLET BY MOUTH EVERY evening 90 tablet 3   metFORMIN (GLUCOPHAGE) 1000 MG tablet Take 1 tablet (1,000 mg total) by mouth 2 (two) times daily. 180 tablet 1   montelukast (SINGULAIR) 10 MG tablet STARTING 2 DAYS BEFORE INFUSION, TAKE 1 TABLET BY MOUTH ONCE DAILY *DO NOT TAKE ON DAY OF INFUSION* 20 tablet 10   ondansetron  (ZOFRAN) 8 MG tablet One pill every 8 hours as needed for nausea/vomitting. 40 tablet 1   pantoprazole (PROTONIX) 40 MG tablet TAKE ONE TABLET BY MOUTH ONCE DAILY 90 tablet 0   prochlorperazine (COMPAZINE) 10 MG tablet Take 1 tablet (10  mg total) by mouth every 6 (six) hours as needed for nausea or vomiting. 40 tablet 1   sertraline (ZOLOFT) 25 MG tablet TAKE ONE TABLET BY MOUTH ONCE DAILY 90 tablet 1   triamcinolone (NASACORT) 55 MCG/ACT AERO nasal inhaler 2 sprays 2 (two) times daily.     furosemide (LASIX) 20 MG tablet Take 1 tablet (20 mg total) by mouth every morning. 30 tablet 1   No current facility-administered medications for this visit.   Facility-Administered Medications Ordered in Other Visits  Medication Dose Route Frequency Provider Last Rate Last Admin   0.9 %  sodium chloride infusion   Intravenous Once Earna Coder, MD       acetaminophen (TYLENOL) tablet 650 mg  650 mg Oral Once Earna Coder, MD       daratumumab-hyaluronidase-fihj (DARZALEX FASPRO) 1800-30000 MG-UT/15ML chemo SQ injection 1,800 mg  1,800 mg Subcutaneous Once Louretta Shorten R, MD       diphenhydrAMINE (BENADRYL) capsule 50 mg  50 mg Oral Once Louretta Shorten R, MD       iron sucrose (VENOFER) 200 mg in sodium chloride 0.9 % 100 mL IVPB  200 mg Intravenous Once Earna Coder, MD        PHYSICAL EXAMINATION:  Vitals:   11/28/22 0916  BP: (!) 106/48  Pulse: 60  Temp: 97.6 F (36.4 C)  SpO2: 96%    Filed Weights   11/28/22 0916  Weight: 130 lb 3.2 oz (59.1 kg)     Physical Exam Vitals and nursing note reviewed.  HENT:     Head: Normocephalic and atraumatic.     Mouth/Throat:     Pharynx: Oropharynx is clear.  Eyes:     Extraocular Movements: Extraocular movements intact.     Pupils: Pupils are equal, round, and reactive to light.  Cardiovascular:     Rate and Rhythm: Normal rate and regular rhythm.  Pulmonary:     Comments: Decreased breath sounds  bilaterally.  Abdominal:     Palpations: Abdomen is soft.  Musculoskeletal:        General: Normal range of motion.     Cervical back: Normal range of motion.  Skin:    General: Skin is warm.  Neurological:     General: No focal deficit present.     Mental Status: She is alert and oriented to person, place, and time.  Psychiatric:        Behavior: Behavior normal.        Judgment: Judgment normal.      LABORATORY DATA:  I have reviewed the data as listed Lab Results  Component Value Date   WBC 3.8 (L) 11/28/2022   HGB 9.4 (L) 11/28/2022   HCT 30.0 (L) 11/28/2022   MCV 102.7 (H) 11/28/2022   PLT 190 11/28/2022   Recent Labs    08/29/22 0905 10/03/22 1048 10/31/22 0955 11/28/22 0903  NA 136 137 135 134*  K 3.6 3.5 3.2* 3.6  CL 104 105 103 105  CO2 23 22 24 24   GLUCOSE 155* 237* 137* 110*  BUN 20 16 17 12   CREATININE 0.90 1.06 1.12* 0.97  CALCIUM 9.7 9.2 9.0 8.7*  GFRNONAA >60  --  48* 57*  PROT 6.6 6.8 6.7 5.9*  ALBUMIN 3.7 4.2 4.1 3.5  AST 18 20 17 19   ALT 15 15 11 12   ALKPHOS 60 59 52 47  BILITOT 0.6 0.6 0.7 0.4    RADIOGRAPHIC STUDIES: I have personally reviewed the radiological images  as listed and agreed with the findings in the report. No results found.  Multiple myeloma not having achieved remission (HCC) # STAGE I- MULTIPLE MYELOMA -Active [bone lesions; hypercalcemia; anemia; No renal insuffiencey;Bone marrow-32% plasma cell-   STANDARD Cytogenetics].  Currently on Revlimid-Dex- Dara SQ. Partial response noted;  JULY 2024-M protein = NEG;  K/L RATIO= 1. 6= WNL;  Stable.   # Proceed with Dara SQ today + DEX 20 mg PO [at home/pre-meds; none today- will givendex in clinic.]; Labs today reviewed;  acceptable for treatment today. On Revlimid at 10 mg dose 3 weeks on 1 week off-  Stable.    #Reflux-like symptoms-  Secondary to dexamethasone; on PPI- continue low-dose 4 mg weekly/WED.  and wants to hold off GI evaluation [per daughter]- improved. Stable.    # Anemia-moderate- multifactorial-MM/ IDA-  continue trial of gentle iron. Hb [JAn 2024]- 9-25 August 2022- iron sat-13; stable. Continue venofer.   # Hypokalemia- recommend dietary changes. Hold off script- stable.   # Mild swelling in leg/ likely dependant/compression- no concerns of DVT- stable. on lasix 20 mg/day.    # Diarrhea- improved on Revlimid 10 mg a day- 3 weeks-ON & 1 week OFF. Stable; refilled lomotil- stable.   # Diabetes-on oral hypoglycemic agents; on long acting insulin; 14 units of lantus on T/W/Thursday- Defer to PCP. PBF- BG-237  stable.    # labile BP- recent hypotension- but in general HTN: continue losartan; continue coreg; BP- 150-160- refilled coreg; monitor for now. stable.   # Hypercalcemia secondary to multiple myeloma- [FEB 2023-Zometa ; calcitonin]-calcium today is 9.6. Zometa q 27M-   # Weight loss-s/p re-veluation with Joli -. stable.   #DVT/shingles prophylaxis: Aspirin/acyclovirstable.  # Vaccinations; Ok with flu/COVID shot  # IV access:PIV   Shela [phone]  zometa-q 3 months [last june 2024-]- ; MM; K/L q 4 W  # DISPOSITION:  # HOLD Zometa today.  # proceed with  dara SQ; also proceed with venofer- # follow up in 4 weeks/Friday  MD-labs- cbc/cmp;BNP ; MM panel;K/L light chains;Vit D 25-OH; Thyroid profile;  Dara SQ; possible venofer;Zometa  Dr.B    All questions were answered. The patient knows to call the clinic with any problems, questions or concerns.    Earna Coder, MD 11/28/2022 10:13 AM

## 2022-11-28 NOTE — Progress Notes (Signed)
Nutrition Follow-up:  Patient with multiple myeloma, currently on daratumumab, revlimid.  Met with patient during infusion.  Patient reports that appetite has been pretty good.  Has had a cold recently.  Patient eating broccoli soup, eggs with bologna and cheese, fruit.  Drinking glucerna shakes daily.  Less diarrhea when taking antidiarrheals.      Medications: reviewed  Labs: reviewed  Anthropometrics:   Weight 130 lb 3.2 oz 136 lb on 7/19 131 lb 8 oz on 4/19 128 lb on 2/23 130 lb on 12/8 oz on 1/26   NUTRITION DIAGNOSIS: Inadequate oral intake improving   INTERVENTION:  Continue glucerna shake for added calories and protein Continue antidiarrheal medications Continue high calorie, high protein to prevent further weight loss    MONITORING, EVALUATION, GOAL: weight trends, intake   NEXT VISIT: Friday, Oct 11 during infusion  Joelynn Dust B. Freida Busman, RD, LDN Registered Dietitian [phone]

## 2022-11-28 NOTE — Assessment & Plan Note (Signed)
#   STAGE I- MULTIPLE MYELOMA -Active [bone lesions; hypercalcemia; anemia; No renal insuffiencey;Bone marrow-32% plasma cell-   STANDARD Cytogenetics].  Currently on Revlimid-Dex- Dara SQ. Partial response noted;  JULY 2024-M protein = NEG;  K/L RATIO= 1. 6= WNL;  Stable.   # Proceed with Dara SQ today + DEX 20 mg PO [at home/pre-meds; none today- will givendex in clinic.]; Labs today reviewed;  acceptable for treatment today. On Revlimid at 10 mg dose 3 weeks on 1 week off-  Stable.    #Reflux-like symptoms-  Secondary to dexamethasone; on PPI- continue low-dose 4 mg weekly/WED.  and wants to hold off GI evaluation [per daughter]- improved. Stable.   # Anemia-moderate- multifactorial-MM/ IDA-  continue trial of gentle iron. Hb [JAn 2024]- 9-25 August 2022- iron sat-13; stable. Continue venofer.   # Hypokalemia- recommend dietary changes. Hold off script- stable.   # Mild swelling in leg/ likely dependant/compression- no concerns of DVT- stable. on lasix 20 mg/day.    # Diarrhea- improved on Revlimid 10 mg a day- 3 weeks-ON & 1 week OFF. Stable; refilled lomotil- stable.   # Diabetes-on oral hypoglycemic agents; on long acting insulin; 14 units of lantus on T/W/Thursday- Defer to PCP. PBF- BG-237  stable.    # labile BP- recent hypotension- but in general HTN: continue losartan; continue coreg; BP- 150-160- refilled coreg; monitor for now. stable.   # Hypercalcemia secondary to multiple myeloma- [FEB 2023-Zometa ; calcitonin]-calcium today is 9.6. Zometa q 67M-   # Weight loss-s/p re-veluation with Joli -. stable.   #DVT/shingles prophylaxis: Aspirin/acyclovirstable.  # Vaccinations; Ok with flu/COVID shot  # IV access:PIV   Shela [phone]  zometa-q 3 months [last june 2024-]- ; MM; K/L q 4 W  # DISPOSITION:  # HOLD Zometa today.  # proceed with  dara SQ; also proceed with venofer- # follow up in 4 weeks/Friday  MD-labs- cbc/cmp;BNP ; MM panel;K/L light chains;Vit D 25-OH;  Thyroid profile;  Dara SQ; possible venofer;Zometa  Dr.B

## 2022-11-28 NOTE — Progress Notes (Signed)
1000: patient stated she took her pre-meds (Tylenol and Benadryl) at home.

## 2022-11-28 NOTE — Patient Instructions (Signed)
[facility]  Discharge Instructions: Thank you for choosing [facility] to provide your oncology and hematology care.  If you have a lab appointment with the Cancer Center, please go directly to the Cancer Center and check in at the registration area.  Wear comfortable clothing and clothing appropriate for easy access to any Portacath or PICC line.   We strive to give you quality time with your provider. You may need to reschedule your appointment if you arrive late (15 or more minutes).  Arriving late affects you and other patients whose appointments are after yours.  Also, if you miss three or more appointments without notifying the office, you may be dismissed from the clinic at the provider's discretion.      For prescription refill requests, have your pharmacy contact our office and allow 72 hours for refills to be completed.    Today you received the following chemotherapy and/or immunotherapy agents Daratumumab.      To help prevent nausea and vomiting after your treatment, we encourage you to take your nausea medication as directed.  BELOW ARE SYMPTOMS THAT SHOULD BE REPORTED IMMEDIATELY: *FEVER GREATER THAN 100.4 F (38 C) OR HIGHER *CHILLS OR SWEATING *NAUSEA AND VOMITING THAT IS NOT CONTROLLED WITH YOUR NAUSEA MEDICATION *UNUSUAL SHORTNESS OF BREATH *UNUSUAL BRUISING OR BLEEDING *URINARY PROBLEMS (pain or burning when urinating, or frequent urination) *BOWEL PROBLEMS (unusual diarrhea, constipation, pain near the anus) TENDERNESS IN MOUTH AND THROAT WITH OR WITHOUT PRESENCE OF ULCERS (sore throat, sores in mouth, or a toothache) UNUSUAL RASH, SWELLING OR PAIN  UNUSUAL VAGINAL DISCHARGE OR ITCHING   Items with * indicate a potential emergency and should be followed up as soon as possible or go to the Emergency Department if any problems should occur.  Please show the CHEMOTHERAPY ALERT CARD or IMMUNOTHERAPY ALERT CARD at check-in  to the Emergency Department and triage nurse.  Should you have questions after your visit or need to cancel or reschedule your appointment, please contact [facility]  [phone] and follow the prompts.  Office hours are 8:00 a.m. to 4:30 p.m. Monday - Friday. Please note that voicemails left after 4:00 p.m. may not be returned until the following business day.  We are closed weekends and major holidays. You have access to a nurse at all times for urgent questions. Please call the main number to the clinic [phone] and follow the prompts.  For any non-urgent questions, you may also contact your provider using MyChart. We now offer e-Visits for anyone 36 and older to request care online for non-urgent symptoms. For details visit [email or web address].   Also download the MyChart app! Go to the app store, search "MyChart", open the app, select , and log in with your MyChart username and password.

## 2022-12-01 ENCOUNTER — Telehealth: Payer: Self-pay | Admitting: *Deleted

## 2022-12-01 LAB — KAPPA/LAMBDA LIGHT CHAINS
Kappa free light chain: 25.6 mg/L — ABNORMAL HIGH (ref 3.3–19.4)
Kappa, lambda light chain ratio: 1.75 — ABNORMAL HIGH (ref 0.26–1.65)
Lambda free light chains: 14.6 mg/L (ref 5.7–26.3)

## 2022-12-01 NOTE — Telephone Encounter (Signed)
Cristina Morgan- daughter Cristina Morgan sent me a secure email for fmla papers. I last submitted this for the daughter in March 2023. Daughter did not include in email whether she wanted intermittent or continuous. I inquired about this and I'm waiting for feedback to clarify type of fmla.

## 2022-12-02 ENCOUNTER — Telehealth: Payer: Self-pay | Admitting: *Deleted

## 2022-12-02 ENCOUNTER — Encounter: Payer: Self-pay | Admitting: Internal Medicine

## 2022-12-02 NOTE — Telephone Encounter (Signed)
Form signed and Cristina Morgan informed that it is ready for pick up at registration desk. She thanked me for lettingher know

## 2022-12-02 NOTE — Telephone Encounter (Signed)
Received FMLA from patient daughter. Form completed and sent for physician signature

## 2022-12-02 NOTE — Telephone Encounter (Signed)
Steward Drone picked up signed forms.

## 2022-12-05 ENCOUNTER — Other Ambulatory Visit: Payer: Self-pay | Admitting: Internal Medicine

## 2022-12-05 DIAGNOSIS — I10 Essential (primary) hypertension: Secondary | ICD-10-CM

## 2022-12-08 ENCOUNTER — Encounter: Payer: Self-pay | Admitting: Internal Medicine

## 2022-12-12 ENCOUNTER — Other Ambulatory Visit: Payer: Self-pay | Admitting: *Deleted

## 2022-12-12 DIAGNOSIS — C9000 Multiple myeloma not having achieved remission: Secondary | ICD-10-CM

## 2022-12-12 MED ORDER — LENALIDOMIDE 10 MG PO CAPS
10.0000 mg | ORAL_CAPSULE | Freq: Every day | ORAL | 0 refills | Status: DC
Start: 2022-12-12 — End: 2023-01-22

## 2022-12-16 ENCOUNTER — Encounter: Payer: Self-pay | Admitting: Family Medicine

## 2022-12-17 ENCOUNTER — Other Ambulatory Visit: Payer: Self-pay | Admitting: Family Medicine

## 2022-12-17 DIAGNOSIS — E119 Type 2 diabetes mellitus without complications: Secondary | ICD-10-CM

## 2022-12-17 MED ORDER — LANTUS SOLOSTAR 100 UNIT/ML ~~LOC~~ SOPN
10.0000 [IU] | PEN_INJECTOR | Freq: Every day | SUBCUTANEOUS | 0 refills | Status: DC
Start: 2022-12-17 — End: 2023-01-09

## 2022-12-26 ENCOUNTER — Inpatient Hospital Stay: Payer: Medicare Other | Admitting: Internal Medicine

## 2022-12-26 ENCOUNTER — Inpatient Hospital Stay: Payer: Medicare Other

## 2022-12-26 ENCOUNTER — Encounter: Payer: Self-pay | Admitting: Internal Medicine

## 2022-12-26 ENCOUNTER — Inpatient Hospital Stay: Payer: Medicare Other | Attending: Nurse Practitioner

## 2022-12-26 VITALS — BP 127/53 | HR 64 | Temp 98.4°F | Resp 18 | Wt 126.9 lb

## 2022-12-26 DIAGNOSIS — R519 Headache, unspecified: Secondary | ICD-10-CM | POA: Diagnosis not present

## 2022-12-26 DIAGNOSIS — R600 Localized edema: Secondary | ICD-10-CM | POA: Diagnosis not present

## 2022-12-26 DIAGNOSIS — Z7961 Long term (current) use of immunomodulator: Secondary | ICD-10-CM | POA: Insufficient documentation

## 2022-12-26 DIAGNOSIS — Z7982 Long term (current) use of aspirin: Secondary | ICD-10-CM | POA: Diagnosis not present

## 2022-12-26 DIAGNOSIS — C9000 Multiple myeloma not having achieved remission: Secondary | ICD-10-CM

## 2022-12-26 DIAGNOSIS — I509 Heart failure, unspecified: Secondary | ICD-10-CM

## 2022-12-26 DIAGNOSIS — E876 Hypokalemia: Secondary | ICD-10-CM | POA: Insufficient documentation

## 2022-12-26 DIAGNOSIS — E119 Type 2 diabetes mellitus without complications: Secondary | ICD-10-CM | POA: Diagnosis not present

## 2022-12-26 DIAGNOSIS — D509 Iron deficiency anemia, unspecified: Secondary | ICD-10-CM | POA: Diagnosis not present

## 2022-12-26 DIAGNOSIS — Z5112 Encounter for antineoplastic immunotherapy: Secondary | ICD-10-CM | POA: Diagnosis not present

## 2022-12-26 DIAGNOSIS — I10 Essential (primary) hypertension: Secondary | ICD-10-CM | POA: Diagnosis not present

## 2022-12-26 DIAGNOSIS — Z79620 Long term (current) use of immunosuppressive biologic: Secondary | ICD-10-CM | POA: Insufficient documentation

## 2022-12-26 DIAGNOSIS — R197 Diarrhea, unspecified: Secondary | ICD-10-CM | POA: Diagnosis not present

## 2022-12-26 DIAGNOSIS — E559 Vitamin D deficiency, unspecified: Secondary | ICD-10-CM

## 2022-12-26 LAB — CBC WITH DIFFERENTIAL/PLATELET
Abs Immature Granulocytes: 0.01 10*3/uL (ref 0.00–0.07)
Basophils Absolute: 0 10*3/uL (ref 0.0–0.1)
Basophils Relative: 1 %
Eosinophils Absolute: 0.1 10*3/uL (ref 0.0–0.5)
Eosinophils Relative: 4 %
HCT: 31.7 % — ABNORMAL LOW (ref 36.0–46.0)
Hemoglobin: 10.3 g/dL — ABNORMAL LOW (ref 12.0–15.0)
Immature Granulocytes: 0 %
Lymphocytes Relative: 27 %
Lymphs Abs: 1 10*3/uL (ref 0.7–4.0)
MCH: 32.7 pg (ref 26.0–34.0)
MCHC: 32.5 g/dL (ref 30.0–36.0)
MCV: 100.6 fL — ABNORMAL HIGH (ref 80.0–100.0)
Monocytes Absolute: 0.3 10*3/uL (ref 0.1–1.0)
Monocytes Relative: 9 %
Neutro Abs: 2.2 10*3/uL (ref 1.7–7.7)
Neutrophils Relative %: 59 %
Platelets: 163 10*3/uL (ref 150–400)
RBC: 3.15 MIL/uL — ABNORMAL LOW (ref 3.87–5.11)
RDW: 15.2 % (ref 11.5–15.5)
WBC: 3.7 10*3/uL — ABNORMAL LOW (ref 4.0–10.5)
nRBC: 0 % (ref 0.0–0.2)

## 2022-12-26 LAB — CMP (CANCER CENTER ONLY)
ALT: 11 U/L (ref 0–44)
AST: 18 U/L (ref 15–41)
Albumin: 3.8 g/dL (ref 3.5–5.0)
Alkaline Phosphatase: 53 U/L (ref 38–126)
Anion gap: 6 (ref 5–15)
BUN: 14 mg/dL (ref 8–23)
CO2: 24 mmol/L (ref 22–32)
Calcium: 9 mg/dL (ref 8.9–10.3)
Chloride: 104 mmol/L (ref 98–111)
Creatinine: 1.04 mg/dL — ABNORMAL HIGH (ref 0.44–1.00)
GFR, Estimated: 52 mL/min — ABNORMAL LOW (ref >60)
Glucose, Bld: 149 mg/dL — ABNORMAL HIGH (ref 70–99)
Potassium: 3.3 mmol/L — ABNORMAL LOW (ref 3.5–5.1)
Sodium: 134 mmol/L — ABNORMAL LOW (ref 135–145)
Total Bilirubin: 0.7 mg/dL (ref 0.3–1.2)
Total Protein: 6.3 g/dL — ABNORMAL LOW (ref 6.5–8.1)

## 2022-12-26 LAB — VITAMIN D 25 HYDROXY (VIT D DEFICIENCY, FRACTURES): Vit D, 25-Hydroxy: 34.32 ng/mL (ref 30–100)

## 2022-12-26 LAB — BRAIN NATRIURETIC PEPTIDE: B Natriuretic Peptide: 111.3 pg/mL — ABNORMAL HIGH (ref 0.0–100.0)

## 2022-12-26 MED ORDER — DIPHENHYDRAMINE HCL 25 MG PO CAPS: 50.0000 mg | ORAL_CAPSULE | Freq: Once | ORAL | Status: DC

## 2022-12-26 MED ORDER — ZOLEDRONIC ACID 4 MG/5ML IV CONC
3.0000 mg | Freq: Once | INTRAVENOUS | Status: AC
  Administered 2022-12-26: 3 mg via INTRAVENOUS
  Filled 2022-12-26: qty 3.75

## 2022-12-26 MED ORDER — ACETAMINOPHEN 325 MG PO TABS: 650.0000 mg | ORAL_TABLET | Freq: Once | ORAL | Status: DC

## 2022-12-26 MED ORDER — DARATUMUMAB-HYALURONIDASE-FIHJ 1800-30000 MG-UT/15ML ~~LOC~~ SOLN
1800.0000 mg | Freq: Once | SUBCUTANEOUS | Status: AC
  Administered 2022-12-26: 1800 mg via SUBCUTANEOUS
  Filled 2022-12-26: qty 15

## 2022-12-26 MED ORDER — SODIUM CHLORIDE 0.9 % IV SOLN
200.0000 mg | INTRAVENOUS | Status: DC
  Administered 2022-12-26: 200 mg via INTRAVENOUS
  Filled 2022-12-26: qty 200

## 2022-12-26 MED ORDER — SODIUM CHLORIDE 0.9 % IV SOLN
Freq: Once | INTRAVENOUS | Status: AC
  Filled 2022-12-26: qty 250

## 2022-12-26 NOTE — Progress Notes (Signed)
Pt wakes up with a headaches off and on. Has tenderness in Left neck has a small swollen lymph node. Appetite is lower. No nausea. Bowels normal. Gets diarrhea from glucerna. Still drinks it.

## 2022-12-26 NOTE — Progress Notes (Signed)
Nutrition Follow-up:  Patient with multiple myeloma, currently on daratumumab, revlimid.    Met with patient during infusion.  Reports that her appetite has decreased but she is still trying to eat.  Food taste bitter.  Has been taking walks and moving around more.  Says that she takes a diarrhea pill before she drinks glucerna shake and it helps with diarrhea.    Medications: reviewed  Labs: reviewed  Anthropometrics:   Weight 126 lb 14.4 oz on 10/11 130 lb 3.2 oz 136 lb on 7/19 131 lb 8 oz on 4/19 128 lb on 2/23 130 lb on 12.8 oz on 1/26   NUTRITION DIAGNOSIS: Inadequate oral intake ongoing    INTERVENTION:  Encouraged high calorie, high protein snack after she takes her walk for added nutrition Reviewed high calorie snacks Continue antidiarrheal medications    MONITORING, EVALUATION, GOAL: weight trends, intake   NEXT VISIT: Friday, Nov 8 during infusion  Willye Javier B. Freida Busman, RD, LDN Registered Dietitian [phone]

## 2022-12-26 NOTE — Patient Instructions (Signed)
[facility]  Discharge Instructions: Thank you for choosing [facility] to provide your oncology and hematology care.  If you have a lab appointment with the Cancer Center, please go directly to the Cancer Center and check in at the registration area.  Wear comfortable clothing and clothing appropriate for easy access to any Portacath or PICC line.   We strive to give you quality time with your provider. You may need to reschedule your appointment if you arrive late (15 or more minutes).  Arriving late affects you and other patients whose appointments are after yours.  Also, if you miss three or more appointments without notifying the office, you may be dismissed from the clinic at the provider's discretion.      For prescription refill requests, have your pharmacy contact our office and allow 72 hours for refills to be completed.    Today you received the following chemotherapy and/or immunotherapy agents Darzalex      To help prevent nausea and vomiting after your treatment, we encourage you to take your nausea medication as directed.  BELOW ARE SYMPTOMS THAT SHOULD BE REPORTED IMMEDIATELY: *FEVER GREATER THAN 100.4 F (38 C) OR HIGHER *CHILLS OR SWEATING *NAUSEA AND VOMITING THAT IS NOT CONTROLLED WITH YOUR NAUSEA MEDICATION *UNUSUAL SHORTNESS OF BREATH *UNUSUAL BRUISING OR BLEEDING *URINARY PROBLEMS (pain or burning when urinating, or frequent urination) *BOWEL PROBLEMS (unusual diarrhea, constipation, pain near the anus) TENDERNESS IN MOUTH AND THROAT WITH OR WITHOUT PRESENCE OF ULCERS (sore throat, sores in mouth, or a toothache) UNUSUAL RASH, SWELLING OR PAIN  UNUSUAL VAGINAL DISCHARGE OR ITCHING   Items with * indicate a potential emergency and should be followed up as soon as possible or go to the Emergency Department if any problems should occur.  Please show the CHEMOTHERAPY ALERT CARD or IMMUNOTHERAPY ALERT CARD at check-in to  the Emergency Department and triage nurse.  Should you have questions after your visit or need to cancel or reschedule your appointment, please contact [facility] [place] [facility]  [phone] and follow the prompts.  Office hours are 8:00 a.m. to 4:30 p.m. Monday - Friday. Please note that voicemails left after 4:00 p.m. may not be returned until the following business day.  We are closed weekends and major holidays. You have access to a nurse at all times for urgent questions. Please call the main number to the clinic [phone] and follow the prompts.  For any non-urgent questions, you may also contact your provider using MyChart. We now offer e-Visits for anyone 18 and older to request care online for non-urgent symptoms. For details visit [email or web address].   Also download the MyChart app! Go to the app store, search "MyChart", open the app, select , and log in with your MyChart username and password.      

## 2022-12-26 NOTE — Progress Notes (Signed)
Pt took Darzalex premeds prior to arrival.

## 2022-12-26 NOTE — Assessment & Plan Note (Addendum)
#   STAGE I- MULTIPLE MYELOMA -Active [bone lesions; hypercalcemia; anemia; No renal insuffiencey;Bone marrow-32% plasma cell-   STANDARD Cytogenetics].  Currently on Revlimid-Dex- Dara SQ. Partial response noted;  JULY 2024-M protein = NEG;  K/L RATIO= 1. 6= WNL;  Stable.   # Proceed with Dara SQ today + DEX 20 mg PO [at home/pre-meds; none today- will givendex in clinic.]; Labs today reviewed;  acceptable for treatment today. On Revlimid at 10 mg dose 3 weeks on 1 week off-  Stable.    #Reflux-like symptoms-  Secondary to dexamethasone; on PPI- continue low-dose 4 mg weekly/WED.  and wants to hold off GI evaluation [per daughter]- improved. Stable.   # Anemia-moderate- multifactorial-MM/ IDA-  continue trial of gentle iron. Hb [JAn 2024]- 9-25 August 2022- iron sat-13; stable. Continue venofer.   # Hypokalemia- recommend dietary changes. Hold off script- stable.   # Mild swelling in leg/ likely dependant/compression- no concerns of DVT- stable. on lasix 20 mg/day.    # Diarrhea- improved on Revlimid 10 mg a day- 3 weeks-ON & 1 week OFF. Stable; refilled lomotil- stable.   # Diabetes-on oral hypoglycemic agents; on long acting insulin; 14 units of lantus on T/W/Thursday- Defer to PCP. PBF- BG-237  stable.    # labile BP- recent hypotension- but in general HTN: continue losartan; continue coreg; BP- 150-160- refilled coreg; monitor for now. stable.   # Hypercalcemia secondary to multiple myeloma- [FEB 2023-Zometa ; calcitonin]-calcium today is 9.6. Zometa q 2M-   # Weight loss-s/p re-veluation with Joli -. stable.   #DVT/shingles prophylaxis: Aspirin/acyclovirstable.  # Vaccinations; Ok with flu/COVID shot  # IV access:PIV   Shela Commons- [phone]  zometa-q 3 months [last june 2024-]- ; MM; K/L q 4 W  # DISPOSITION:  # Zometa today;  # proceed with  dara SQ; also proceed with venofer- # follow up in 4 weeks/Friday  MD-labs- cbc/cmp;BNP ; MM panel;K/L light chains; Thyroid profile;   Dara SQ; possible venofer; Dr.B

## 2022-12-26 NOTE — Progress Notes (Signed)
Usually evaluated [facility] CONSULT NOTE  Patient Care Team: Duanne Limerick, MD as PCP - General (Family Medicine) Lajean Manes, [facility] (Inactive) (Pharmacist) Earna Coder, MD as Consulting Physician (Oncology)  CHIEF COMPLAINTS/PURPOSE OF CONSULTATION: Multiple myeloma  Oncology History Overview Note  # MULTIPLE MYELOMA  [FEB 2023-hypercalcemia status changes]-Active [bone lesions; hypercalcemia; anemia; No renal insuffiencey;Bone marrow-32% plasma cell-   STANDARD Cytogenetics].FEB 2023- S/p dexamethasone 20 mg q day x4.   # REV [15 mg -3 week-On and 1 week-OFF]-DARA; May 2023-discontinued rev 15 diarrhea/hypotension  # MAY 2023- dara-Rev 10 mg 3w/1w   # FEB 2023-hypercalcemia [[facility]-status post calcitonin; bisphosphonate]  BONE MARROW, ASPIRATE, CLOT, CORE:  -Hypercellular bone marrow with plasma cell neoplasm  -See comment   PERIPHERAL BLOOD:  -Normocytic-normochromic anemia   COMMENT:   The bone marrow is hypercellular for age with increased number of  atypical plasma cells representing 32% of all cells in the aspirate  associated with interstitial infiltrates and numerous variably sized  clusters in the clot/biopsy sections.  The plasma cells display weak  kappa light chain restriction consistent with plasma cell neoplasm.  Correlation with cytogenetic and FISH studies is recommended   MICROSCOPIC DESCRIPTION:   PERIPHERAL BLOOD SMEAR: The red blood cells display mild  anisopoikilocytosis with mild polychromasia.  The white blood cells are  normal number with scattered hypogranular neutrophils. An occasional  myelocyte and large atypical mononuclear cells are seen on scan. The  platelets are normal in number.    Multiple myeloma not having achieved remission (HCC)  05/15/2021 Initial Diagnosis   Multiple myeloma not having achieved remission (HCC)   06/27/2021 - 10/17/2021 Chemotherapy   Patient is on Treatment Plan : MYELOMA Daratumumab SQ  + Lenalidomide + Dexamethasone (DaraRd) q28d     06/27/2021 -  Chemotherapy   Patient is on Treatment Plan : MYELOMA RELAPSED REFRACTORY Daratumumab SQ + Lenalidomide + Dexamethasone (DaraRd) q28d     07/11/2021 Cancer Staging   Staging form: Plasma Cell Myeloma and Plasma Cell Disorders, AJCC 8th Edition - Clinical: Beta-2-microglobulin (mg/L): 2.6, Albumin (g/dL): 3.9, ISS: Stage I - Signed by Earna Coder, MD on 07/11/2021 Stage prefix: Initial diagnosis Beta 2 microglobulin range (mg/L): Less than 3.5 Albumin range (g/dL): Greater than or equal to 3.5     HISTORY OF PRESENTING ILLNESS: pt in a wheel chair; accompanied by her daughter.   Cristina Morgan 88 y.o.  female with  multiple myeloma currently on Rev-Dex is proceed with Dara SQ today.    Pt wakes up with a headaches off and on. Has tenderness in Left neck has a small swollen lymph node.  Appetite is lower. No nausea. Bowels normal. Gets diarrhea from glucerna. Still drinks glucerna.   SOB with exertion; no worsening swelling in legs no cough. No worsening diarrhea.   Denies any nausea vomiting.  Denies any blood in stools or black-colored stools.  Denies any worsening joint pains.   Review of Systems  Constitutional:  Positive for malaise/fatigue and weight loss. Negative for chills, diaphoresis and fever.  HENT:  Negative for nosebleeds and sore throat.   Eyes:  Negative for double vision.  Respiratory:  Negative for cough, hemoptysis, sputum production, shortness of breath and wheezing.   Cardiovascular:  Negative for chest pain, palpitations, orthopnea and leg swelling.  Gastrointestinal:  Negative for abdominal pain, blood in stool, constipation, diarrhea, heartburn, melena, nausea and vomiting.  Genitourinary:  Negative for dysuria, frequency and urgency.  Musculoskeletal:  Positive  for back pain and joint pain.  Skin: Negative.  Negative for itching and rash.  Neurological:  Negative for dizziness, tingling,  focal weakness, weakness and headaches.  Endo/Heme/Allergies:  Does not bruise/bleed easily.  Psychiatric/Behavioral:  Negative for depression. The patient is not nervous/anxious and does not have insomnia.     MEDICAL HISTORY:  Past Medical History:  Diagnosis Date   Anemia    CAD (coronary artery disease)    Cataracts, bilateral    Closed compression fracture of first lumbar vertebra (HCC)    Closed compression fracture of second lumbar vertebra (HCC)    Diabetes mellitus without complication (HCC)    High cholesterol    History of pneumonia 2010   [place]   History of uterine fibroid    Hypercalcemia    Hyperlipidemia    Hypertension    Lytic bone lesions on xray    Multiple myeloma (HCC)    Osteoporosis    Primary osteoarthritis of right knee     SURGICAL HISTORY: Past Surgical History:  Procedure Laterality Date   CATARACT EXTRACTION Bilateral 2014   COLONOSCOPY  2011   cleared for 5 yrs- [facility]   ECTOPIC PREGNANCY SURGERY      SOCIAL HISTORY: Social History   Socioeconomic History   Marital status: Married    Spouse name: Not on file   Number of children: 2   Years of education: Not on file   Highest education level: 12th grade  Occupational History   Occupation: Retired  Tobacco Use   Smoking status: Never    Passive exposure: Never   Smokeless tobacco: Never   Tobacco comments:    smoking cessation materials not required  Vaping Use   Vaping status: Never Used  Substance and Sexual Activity   Alcohol use: No   Drug use: No   Sexual activity: Not Currently  Other Topics Concern   Not on file  Social History Narrative   [place] with husband; drives. Never smoked; no alcohol. Daughters live close.    Social Determinants of Health   Financial Resource Strain: Low Risk  (11/19/2022)   Overall Financial Resource Strain (CARDIA)    Difficulty of Paying Living Expenses: Not very hard  Food Insecurity: No Food Insecurity (11/19/2022)   Hunger  Vital Sign    Worried About Running Out of Food in the Last Year: Never true    Ran Out of Food in the Last Year: Never true  Transportation Needs: No Transportation Needs (11/19/2022)   PRAPARE - Administrator, Civil Service (Medical): No    Lack of Transportation (Non-Medical): No  Physical Activity: Sufficiently Active (11/19/2022)   Exercise Vital Sign    Days of Exercise per Week: 5 days    Minutes of Exercise per Session: 30 min  Stress: No Stress Concern Present (11/19/2022)   [employer] of Occupational Health - Occupational Stress Questionnaire    Feeling of Stress : Not at all  Social Connections: Moderately Integrated (11/19/2022)   Social Connection and Isolation Panel [NHANES]    Frequency of Communication with Friends and Family: Twice a week    Frequency of Social Gatherings with Friends and Family: More than three times a week    Attends Religious Services: More than 4 times per year    Active Member of [employer] or Organizations: No    Attends Banker Meetings: Never    Marital Status: Married  Catering manager Violence: Not At Risk (11/19/2022)   Humiliation,  Afraid, Rape, and Kick questionnaire    Fear of Current or Ex-Partner: No    Emotionally Abused: No    Physically Abused: No    Sexually Abused: No    FAMILY HISTORY: Family History  Problem Relation Age of Onset   Cancer Mother        breast   Heart disease Mother    Diabetes Father    Heart disease Father    Heart attack Father    Heart attack Brother    Diabetes Brother     ALLERGIES:  is allergic to latex and penicillins.  MEDICATIONS:  Current Outpatient Medications  Medication Sig Dispense Refill   Accu-Chek Softclix Lancets lancets Use as instructed 100 each 0   acetaminophen (TYLENOL) 325 MG tablet Take 325 mg by mouth 2 (two) times daily. START 2 days prior to chemo infusion. Take For 2 days; Do NOT take on the day of infusion     acyclovir (ZOVIRAX) 400 MG tablet TAKE  1 TABLET BY MOUTH TWICE DAILY 60 tablet 10   aspirin EC 81 MG tablet Take 81 mg by mouth daily. Swallow whole.     BD PEN NEEDLE NANO 2ND GEN 32G X 4 MM MISC USE 1  ONCE DAILY 100 each 0   Blood Glucose Monitoring Suppl (ACCU-CHEK GUIDE) w/Device KIT USE AS DIRECTED TO CHECK GLUCOSE     Calcium Carbonate-Vit D-Min (CALCIUM 1200 PO) Take 1 capsule by mouth daily.     carvedilol (COREG) 3.125 MG tablet TAKE 1 TABLET BY MOUTH TWICE DAILY 60 tablet 10   cholecalciferol (VITAMIN D3) 25 MCG (1000 UNIT) tablet Take 1,000 Units by mouth daily.     dexamethasone (DECADRON) 4 MG tablet Take 5 tablets (20 mg total) by mouth as directed. Take one hour before monthly Darzalex injections. 15 tablet 3   diphenoxylate-atropine (LOMOTIL) 2.5-0.025 MG tablet Take 1 tablet by mouth 4 times daily as needed for diarrhea or loose stools. Take along with immodium. 60 tablet 3   furosemide (LASIX) 20 MG tablet Take 1 tablet (20 mg total) by mouth every morning. 30 tablet 1   Glucerna (GLUCERNA) LIQD Take 237 mLs by mouth.     glucose blood (ACCU-CHEK GUIDE) test strip Use as instructed 100 each 0   hydrOXYzine (ATARAX) 10 MG tablet Take 1 tablet (10 mg total) by mouth at bedtime as needed. 90 tablet 1   Infant Foods (GOOD START 2 ESSENTIALS/IRON PO) Take 1 tablet by mouth daily.     insulin glargine (LANTUS SOLOSTAR) 100 UNIT/ML Solostar Pen Inject 10 Units into the skin daily. 6 units every day except Wed and Thursday- 8 units 15 mL 0   lenalidomide (REVLIMID) 10 MG capsule Take 1 capsule (10 mg total) by mouth daily. Take for 21 days, then hold for 7 days. Repeat every 28 days. 21 capsule 0   losartan (COZAAR) 25 MG tablet TAKE ONE TABLET BY MOUTH EVERY EVENING 90 tablet 0   losartan (COZAAR) 50 MG tablet Take 1 tablet (50 mg total) by mouth every evening. 90 tablet 0   lovastatin (MEVACOR) 40 MG tablet TAKE ONE TABLET BY MOUTH EVERY evening 90 tablet 3   metFORMIN (GLUCOPHAGE) 1000 MG tablet Take 1 tablet (1,000 mg  total) by mouth 2 (two) times daily. 180 tablet 1   montelukast (SINGULAIR) 10 MG tablet STARTING 2 DAYS BEFORE INFUSION, TAKE 1 TABLET BY MOUTH ONCE DAILY *DO NOT TAKE ON DAY OF INFUSION* 20 tablet 10   ondansetron (ZOFRAN)  8 MG tablet One pill every 8 hours as needed for nausea/vomitting. 40 tablet 1   pantoprazole (PROTONIX) 40 MG tablet TAKE ONE TABLET BY MOUTH ONCE DAILY 90 tablet 0   prochlorperazine (COMPAZINE) 10 MG tablet Take 1 tablet (10 mg total) by mouth every 6 (six) hours as needed for nausea or vomiting. 40 tablet 1   sertraline (ZOLOFT) 25 MG tablet TAKE ONE TABLET BY MOUTH ONCE DAILY 90 tablet 1   triamcinolone (NASACORT) 55 MCG/ACT AERO nasal inhaler 2 sprays 2 (two) times daily.     No current facility-administered medications for this visit.    PHYSICAL EXAMINATION:  Vitals:   12/26/22 1348  BP: (!) 127/53  Pulse: 64  Resp: 18  Temp: 98.4 F (36.9 C)  SpO2: 100%     Filed Weights   12/26/22 1348  Weight: 126 lb 14.4 oz (57.6 kg)      Physical Exam Vitals and nursing note reviewed.  HENT:     Head: Normocephalic and atraumatic.     Mouth/Throat:     Pharynx: Oropharynx is clear.  Eyes:     Extraocular Movements: Extraocular movements intact.     Pupils: Pupils are equal, round, and reactive to light.  Cardiovascular:     Rate and Rhythm: Normal rate and regular rhythm.  Pulmonary:     Comments: Decreased breath sounds bilaterally.  Abdominal:     Palpations: Abdomen is soft.  Musculoskeletal:        General: Normal range of motion.     Cervical back: Normal range of motion.  Skin:    General: Skin is warm.  Neurological:     General: No focal deficit present.     Mental Status: She is alert and oriented to person, place, and time.  Psychiatric:        Behavior: Behavior normal.        Judgment: Judgment normal.      LABORATORY DATA:  I have reviewed the data as listed Lab Results  Component Value Date   WBC 3.7 (L) 12/26/2022   HGB  10.3 (L) 12/26/2022   HCT 31.7 (L) 12/26/2022   MCV 100.6 (H) 12/26/2022   PLT 163 12/26/2022   Recent Labs    10/31/22 0955 11/28/22 0903 12/26/22 1329  NA 135 134* 134*  K 3.2* 3.6 3.3*  CL 103 105 104  CO2 24 24 24   GLUCOSE 137* 110* 149*  BUN 17 12 14   CREATININE 1.12* 0.97 1.04*  CALCIUM 9.0 8.7* 9.0  GFRNONAA 48* 57* 52*  PROT 6.7 5.9* 6.3*  ALBUMIN 4.1 3.5 3.8  AST 17 19 18   ALT 11 12 11   ALKPHOS 52 47 53  BILITOT 0.7 0.4 0.7    RADIOGRAPHIC STUDIES: I have personally reviewed the radiological images as listed and agreed with the findings in the report. No results found.  Multiple myeloma not having achieved remission (HCC) # STAGE I- MULTIPLE MYELOMA -Active [bone lesions; hypercalcemia; anemia; No renal insuffiencey;Bone marrow-32% plasma cell-   STANDARD Cytogenetics].  Currently on Revlimid-Dex- Dara SQ. Partial response noted;  JULY 2024-M protein = NEG;  K/L RATIO= 1. 6= WNL;  Stable.   # Proceed with Dara SQ today + DEX 20 mg PO [at home/pre-meds; none today- will givendex in clinic.]; Labs today reviewed;  acceptable for treatment today. On Revlimid at 10 mg dose 3 weeks on 1 week off-  Stable.    #Reflux-like symptoms-  Secondary to dexamethasone; on PPI- continue low-dose 4 mg  weekly/WED.  and wants to hold off GI evaluation [per daughter]- improved. Stable.   # Anemia-moderate- multifactorial-MM/ IDA-  continue trial of gentle iron. Hb [JAn 2024]- 9-25 August 2022- iron sat-13; stable. Continue venofer.   # Hypokalemia- recommend dietary changes. Hold off script- stable.   # Mild swelling in leg/ likely dependant/compression- no concerns of DVT- stable. on lasix 20 mg/day.    # Diarrhea- improved on Revlimid 10 mg a day- 3 weeks-ON & 1 week OFF. Stable; refilled lomotil- stable.   # Diabetes-on oral hypoglycemic agents; on long acting insulin; 14 units of lantus on T/W/Thursday- Defer to PCP. PBF- BG-237  stable.    # labile BP- recent hypotension- but  in general HTN: continue losartan; continue coreg; BP- 150-160- refilled coreg; monitor for now. stable.   # Hypercalcemia secondary to multiple myeloma- [FEB 2023-Zometa ; calcitonin]-calcium today is 9.6. Zometa q 29M-   # Weight loss-s/p re-veluation with Joli -. stable.   #DVT/shingles prophylaxis: Aspirin/acyclovirstable.  # Vaccinations; Ok with flu/COVID shot  # IV access:PIV   Shela Commons- [phone]  zometa-q 3 months [last june 2024-]- ; MM; K/L q 4 W  # DISPOSITION:  # Zometa today;  # proceed with  dara SQ; also proceed with venofer- # follow up in 4 weeks/Friday  MD-labs- cbc/cmp;BNP ; MM panel;K/L light chains; Thyroid profile;  Dara SQ; possible venofer; Dr.B    All questions were answered. The patient knows to call the clinic with any problems, questions or concerns.    Earna Coder, MD 12/26/2022 2:24 PM

## 2022-12-27 LAB — THYROID PANEL WITH TSH
Free Thyroxine Index: 1.7 (ref 1.2–4.9)
T3 Uptake Ratio: 22 % — ABNORMAL LOW (ref 24–39)
T4, Total: 7.8 ug/dL (ref 4.5–12.0)
TSH: 0.924 u[IU]/mL (ref 0.450–4.500)

## 2022-12-28 LAB — KAPPA/LAMBDA LIGHT CHAINS
Kappa free light chain: 20.2 mg/L — ABNORMAL HIGH (ref 3.3–19.4)
Kappa, lambda light chain ratio: 1.43 (ref 0.26–1.65)
Lambda free light chains: 14.1 mg/L (ref 5.7–26.3)

## 2022-12-30 ENCOUNTER — Telehealth: Payer: Self-pay | Admitting: Family Medicine

## 2022-12-30 LAB — MULTIPLE MYELOMA PANEL, SERUM
Albumin SerPl Elph-Mcnc: 3.4 g/dL (ref 2.9–4.4)
Albumin/Glob SerPl: 1.6 (ref 0.7–1.7)
Alpha 1: 0.2 g/dL (ref 0.0–0.4)
Alpha2 Glob SerPl Elph-Mcnc: 0.7 g/dL (ref 0.4–1.0)
B-Globulin SerPl Elph-Mcnc: 0.8 g/dL (ref 0.7–1.3)
Gamma Glob SerPl Elph-Mcnc: 0.5 g/dL (ref 0.4–1.8)
Globulin, Total: 2.2 g/dL (ref 2.2–3.9)
IgA: 78 mg/dL (ref 64–422)
IgG (Immunoglobin G), Serum: 585 mg/dL — ABNORMAL LOW (ref 586–1602)
IgM (Immunoglobulin M), Srm: 18 mg/dL — ABNORMAL LOW (ref 26–217)
Total Protein ELP: 5.6 g/dL — ABNORMAL LOW (ref 6.0–8.5)

## 2022-12-30 NOTE — Telephone Encounter (Signed)
Denied  KP

## 2022-12-30 NOTE — Telephone Encounter (Signed)
Copied from CRM [id number]. Topic: General - Other >> Dec 30, 2022  2:55 PM Franchot Heidelberg wrote: Reason for CRM: Greggory Stallion from [facility] called to see if faxed was received from this morning. Please advise Fax is for a blood pressure cuff  Best contact: [phone]  Fax will need to be signed and returned with last office visit notes.

## 2023-01-01 ENCOUNTER — Other Ambulatory Visit: Payer: Self-pay | Admitting: Internal Medicine

## 2023-01-02 ENCOUNTER — Encounter: Payer: Self-pay | Admitting: Internal Medicine

## 2023-01-09 ENCOUNTER — Ambulatory Visit (INDEPENDENT_AMBULATORY_CARE_PROVIDER_SITE_OTHER): Payer: Medicare Other | Admitting: Family Medicine

## 2023-01-09 ENCOUNTER — Encounter: Payer: Self-pay | Admitting: Family Medicine

## 2023-01-09 VITALS — BP 114/64 | HR 86 | Ht 66.0 in | Wt 125.0 lb

## 2023-01-09 DIAGNOSIS — Z794 Long term (current) use of insulin: Secondary | ICD-10-CM | POA: Diagnosis not present

## 2023-01-09 DIAGNOSIS — Z23 Encounter for immunization: Secondary | ICD-10-CM

## 2023-01-09 DIAGNOSIS — E119 Type 2 diabetes mellitus without complications: Secondary | ICD-10-CM

## 2023-01-09 DIAGNOSIS — Z7984 Long term (current) use of oral hypoglycemic drugs: Secondary | ICD-10-CM | POA: Diagnosis not present

## 2023-01-09 MED ORDER — METFORMIN HCL 1000 MG PO TABS
1000.0000 mg | ORAL_TABLET | Freq: Two times a day (BID) | ORAL | 1 refills | Status: DC
Start: 2023-01-09 — End: 2023-01-31

## 2023-01-09 MED ORDER — LANTUS SOLOSTAR 100 UNIT/ML ~~LOC~~ SOPN: 10.0000 [IU] | PEN_INJECTOR | Freq: Every day | SUBCUTANEOUS | 0 refills | Status: DC

## 2023-01-09 NOTE — Progress Notes (Signed)
Date:  01/09/2023   Name:  Cristina Morgan   DOB:  1934/04/04   MRN:  [id number]   Chief Complaint: Diabetes  Diabetes She presents for her follow-up diabetic visit. She has type 2 diabetes mellitus. Her disease course has been stable. There are no hypoglycemic associated symptoms. Pertinent negatives for diabetes include no blurred vision, no chest pain, no fatigue, no foot paresthesias, no foot ulcerations, no polydipsia, no polyphagia, no polyuria, no visual change, no weakness and no weight loss. There are no hypoglycemic complications. Symptoms are stable. There are no diabetic complications. Risk factors for coronary artery disease include dyslipidemia, hypertension and diabetes mellitus. Current diabetic treatment includes oral agent (monotherapy) and insulin injections. She is compliant with treatment most of the time. She is following a generally healthy diet. An ACE inhibitor/angiotensin II receptor blocker is being taken.    Lab Results  Component Value Date   NA 134 (L) 12/26/2022   K 3.3 (L) 12/26/2022   CO2 24 12/26/2022   GLUCOSE 149 (H) 12/26/2022   BUN 14 12/26/2022   CREATININE 1.04 (H) 12/26/2022   CALCIUM 9.0 12/26/2022   EGFR 68 02/01/2021   GFRNONAA 52 (L) 12/26/2022   Lab Results  Component Value Date   CHOL 146 05/09/2022   HDL 67 05/09/2022   LDLCALC 74 05/09/2022   TRIG 26 05/09/2022   CHOLHDL 2.2 05/09/2022   Lab Results  Component Value Date   TSH 0.924 12/26/2022   Lab Results  Component Value Date   HGBA1C 7.7 (H) 09/09/2022   Lab Results  Component Value Date   WBC 3.7 (L) 12/26/2022   HGB 10.3 (L) 12/26/2022   HCT 31.7 (L) 12/26/2022   MCV 100.6 (H) 12/26/2022   PLT 163 12/26/2022   Lab Results  Component Value Date   ALT 11 12/26/2022   AST 18 12/26/2022   ALKPHOS 53 12/26/2022   BILITOT 0.7 12/26/2022   Lab Results  Component Value Date   VD25OH 34.32 12/26/2022     Review of Systems  Constitutional:  Negative for  fatigue, unexpected weight change and weight loss.  HENT:  Negative for congestion.   Eyes:  Negative for blurred vision and visual disturbance.  Respiratory:  Negative for cough, chest tightness and wheezing.   Cardiovascular:  Negative for chest pain, palpitations and leg swelling.  Gastrointestinal:  Negative for abdominal pain.  Endocrine: Negative for polydipsia, polyphagia and polyuria.  Genitourinary:  Negative for difficulty urinating.  Neurological:  Negative for weakness.    Patient Active Problem List   Diagnosis Date Noted   Multiple myeloma not having achieved remission (HCC) 05/15/2021   Pressure injury of coccygeal region, stage 2 (HCC) 05/07/2021   Hyponatremia 05/04/2021   Pressure injury of skin 05/02/2021   Vitamin B12 deficiency 05/02/2021   Hypomagnesemia 05/02/2021   Hypophosphatemia 05/02/2021   SVT (supraventricular tachycardia) (HCC) 05/02/2021   Altered mental status 05/01/2021   Acute metabolic encephalopathy 05/01/2021   Hypercalcemia 05/01/2021   Closed compression fracture of second lumbar vertebra (HCC) 10/28/2020   Need for vaccination against Streptococcus pneumoniae using pneumococcal conjugate vaccine 13 01/07/2017   Primary osteoarthritis of right knee 12/08/2016   PNA (pneumonia) 07/09/2015   Acute chest pain 03/20/2014   Type 2 diabetes mellitus (HCC) 03/20/2014   Hypercholesterolemia 03/20/2014   Essential hypertension 03/20/2014    Allergies  Allergen Reactions   Latex Rash   Penicillins Other (See Comments)    Past Surgical History:  Procedure Laterality  Date   CATARACT EXTRACTION Bilateral 2014   COLONOSCOPY  2011   cleared for 5 yrs- [facility]   ECTOPIC PREGNANCY SURGERY      Social History   Tobacco Use   Smoking status: Never    Passive exposure: Never   Smokeless tobacco: Never   Tobacco comments:    smoking cessation materials not required  Vaping Use   Vaping status: Never Used  Substance Use Topics   Alcohol use:  No   Drug use: No     Medication list has been reviewed and updated.  Current Meds  Medication Sig   Accu-Chek Softclix Lancets lancets Use as instructed   acetaminophen (TYLENOL) 325 MG tablet Take 325 mg by mouth 2 (two) times daily. START 2 days prior to chemo infusion. Take For 2 days; Do NOT take on the day of infusion   acyclovir (ZOVIRAX) 400 MG tablet TAKE 1 TABLET BY MOUTH TWICE DAILY   aspirin EC 81 MG tablet Take 81 mg by mouth daily. Swallow whole.   BD PEN NEEDLE NANO 2ND GEN 32G X 4 MM MISC USE 1  ONCE DAILY   Blood Glucose Monitoring Suppl (ACCU-CHEK GUIDE) w/Device KIT USE AS DIRECTED TO CHECK GLUCOSE   Calcium Carbonate-Vit D-Min (CALCIUM 1200 PO) Take 1 capsule by mouth daily.   carvedilol (COREG) 3.125 MG tablet TAKE 1 TABLET BY MOUTH TWICE DAILY   cholecalciferol (VITAMIN D3) 25 MCG (1000 UNIT) tablet Take 1,000 Units by mouth daily.   dexamethasone (DECADRON) 4 MG tablet Take 5 tablets (20 mg total) by mouth as directed. Take one hour before monthly Darzalex injections.   diphenoxylate-atropine (LOMOTIL) 2.5-0.025 MG tablet TAKE 1 TABLET BY MOUTH FOUR TIMES DAILY AS NEEDED FOR DIARRHEA OR LOOSE STOOLS *TAKE WITH IMMODIUM*   furosemide (LASIX) 20 MG tablet Take 1 tablet (20 mg total) by mouth every morning.   Glucerna (GLUCERNA) LIQD Take 237 mLs by mouth.   glucose blood (ACCU-CHEK GUIDE) test strip Use as instructed   hydrOXYzine (ATARAX) 10 MG tablet Take 1 tablet (10 mg total) by mouth at bedtime as needed.   Infant Foods (GOOD START 2 ESSENTIALS/IRON PO) Take 1 tablet by mouth daily.   insulin glargine (LANTUS SOLOSTAR) 100 UNIT/ML Solostar Pen Inject 10 Units into the skin daily. 6 units every day except Wed and Thursday- 8 units   lenalidomide (REVLIMID) 10 MG capsule Take 1 capsule (10 mg total) by mouth daily. Take for 21 days, then hold for 7 days. Repeat every 28 days.   losartan (COZAAR) 25 MG tablet TAKE ONE TABLET BY MOUTH EVERY EVENING   losartan  (COZAAR) 50 MG tablet Take 1 tablet (50 mg total) by mouth every evening.   lovastatin (MEVACOR) 40 MG tablet TAKE ONE TABLET BY MOUTH EVERY evening   metFORMIN (GLUCOPHAGE) 1000 MG tablet Take 1 tablet (1,000 mg total) by mouth 2 (two) times daily.   montelukast (SINGULAIR) 10 MG tablet STARTING 2 DAYS BEFORE INFUSION, TAKE 1 TABLET BY MOUTH ONCE DAILY *DO NOT TAKE ON DAY OF INFUSION*   ondansetron (ZOFRAN) 8 MG tablet One pill every 8 hours as needed for nausea/vomitting.   pantoprazole (PROTONIX) 40 MG tablet TAKE ONE TABLET BY MOUTH ONCE DAILY   prochlorperazine (COMPAZINE) 10 MG tablet Take 1 tablet (10 mg total) by mouth every 6 (six) hours as needed for nausea or vomiting.   sertraline (ZOLOFT) 25 MG tablet TAKE ONE TABLET BY MOUTH ONCE DAILY   triamcinolone (NASACORT) 55 MCG/ACT AERO nasal  inhaler 2 sprays 2 (two) times daily.       01/09/2023    1:51 PM 09/09/2022    2:40 PM 05/02/2022    2:10 PM 04/21/2022    3:59 PM  GAD 7 : Generalized Anxiety Score  Nervous, Anxious, on Edge 0 0 0 0  Control/stop worrying 0 0 0 0  Worry too much - different things 0 0 0 0  Trouble relaxing 0 0 0 0  Restless 0 0 0 0  Easily annoyed or irritable  0 0 0  Afraid - awful might happen 0 0 0 0  Total GAD 7 Score  0 0 0  Anxiety Difficulty Not difficult at all Not difficult at all Not difficult at all Not difficult at all       01/09/2023    1:51 PM 11/19/2022    1:10 PM 09/09/2022    2:39 PM  Depression screen PHQ 2/9  Decreased Interest 0 0 0  Down, Depressed, Hopeless 0 0 0  PHQ - 2 Score 0 0 0  Altered sleeping 0 0 0  Tired, decreased energy 0 0 0  Change in appetite 0 0 0  Feeling bad or failure about yourself  0 0 0  Trouble concentrating 0 0 0  Moving slowly or fidgety/restless 0 0 0  Suicidal thoughts 0 0 0  PHQ-9 Score 0 0 0  Difficult doing work/chores Not difficult at all Not difficult at all Not difficult at all    BP Readings from Last 3 Encounters:  01/09/23 114/64   12/26/22 (!) 127/53  11/28/22 (!) 106/48    Physical Exam Vitals and nursing note reviewed. Exam conducted with a chaperone present.  Constitutional:      General: She is not in acute distress.    Appearance: She is not diaphoretic.  HENT:     Head: Normocephalic and atraumatic.     Right Ear: Tympanic membrane and external ear normal.     Left Ear: Tympanic membrane and external ear normal.     Nose: Nose normal.  Eyes:     General:        Right eye: No discharge.        Left eye: No discharge.     Conjunctiva/sclera: Conjunctivae normal.     Pupils: Pupils are equal, round, and reactive to light.  Neck:     Thyroid: No thyromegaly.     Vascular: No JVD.  Cardiovascular:     Rate and Rhythm: Normal rate and regular rhythm.     Heart sounds: Normal heart sounds. No murmur heard.    No friction rub. No gallop.  Pulmonary:     Effort: Pulmonary effort is normal.     Breath sounds: Normal breath sounds. No wheezing, rhonchi or rales.  Abdominal:     General: Bowel sounds are normal.     Palpations: Abdomen is soft. There is no mass.     Tenderness: There is no abdominal tenderness. There is no guarding.  Musculoskeletal:        General: Normal range of motion.     Cervical back: Normal range of motion and neck supple.  Lymphadenopathy:     Cervical: No cervical adenopathy.  Skin:    General: Skin is warm and dry.  Neurological:     Mental Status: She is alert.     Deep Tendon Reflexes: Reflexes are normal and symmetric.     Wt Readings from Last 3 Encounters:  01/09/23 125 lb (  56.7 kg)  12/26/22 126 lb 14.4 oz (57.6 kg)  11/28/22 130 lb 3.2 oz (59.1 kg)    BP 114/64   Pulse 86   Ht 5\' 6"  (1.676 m)   Wt 125 lb (56.7 kg)   SpO2 95%   BMI 20.18 kg/m   Assessment and Plan: 1. Diabetes mellitus treated with insulin and oral medication (HCC) Chronic.  Controlled.  Stable.  Patient has alternating schedules for her insulin glargine which is  6 units every day  except Wednesday and Thursday 8 units.  In addition she will continue her metformin 1 g twice a day.  Will check A1c for current level of A1c control. - insulin glargine (LANTUS SOLOSTAR) 100 UNIT/ML Solostar Pen; Inject 10 Units into the skin daily. 6 units every day except Wed and Thursday- 8 units  Dispense: 15 mL; Refill: 0 - metFORMIN (GLUCOPHAGE) 1000 MG tablet; Take 1 tablet (1,000 mg total) by mouth 2 (two) times daily.  Dispense: 180 tablet; Refill: 1 - Hemoglobin A1c  2. Need for influenza vaccination Discussed and administered - Flu Vaccine Trivalent High Dose (Fluad)     Elizabeth Sauer, MD

## 2023-01-10 ENCOUNTER — Encounter: Payer: Self-pay | Admitting: Family Medicine

## 2023-01-10 LAB — HEMOGLOBIN A1C
Est. average glucose Bld gHb Est-mCnc: 154 mg/dL
Hgb A1c MFr Bld: 7 % — ABNORMAL HIGH (ref 4.8–5.6)

## 2023-01-16 ENCOUNTER — Other Ambulatory Visit: Payer: Self-pay

## 2023-01-16 ENCOUNTER — Encounter: Payer: Self-pay | Admitting: Family Medicine

## 2023-01-16 DIAGNOSIS — K219 Gastro-esophageal reflux disease without esophagitis: Secondary | ICD-10-CM

## 2023-01-16 MED ORDER — PANTOPRAZOLE SODIUM 40 MG PO TBEC: 40.0000 mg | DELAYED_RELEASE_TABLET | Freq: Every day | ORAL | 1 refills | Status: DC

## 2023-01-22 ENCOUNTER — Other Ambulatory Visit: Payer: Self-pay | Admitting: *Deleted

## 2023-01-22 DIAGNOSIS — C9000 Multiple myeloma not having achieved remission: Secondary | ICD-10-CM

## 2023-01-22 MED ORDER — LENALIDOMIDE 10 MG PO CAPS: 10.0000 mg | ORAL_CAPSULE | Freq: Every day | ORAL | 0 refills | Status: DC

## 2023-01-23 ENCOUNTER — Encounter: Payer: Self-pay | Admitting: Internal Medicine

## 2023-01-23 ENCOUNTER — Other Ambulatory Visit: Payer: Self-pay

## 2023-01-23 ENCOUNTER — Inpatient Hospital Stay: Payer: Medicare Other

## 2023-01-23 ENCOUNTER — Encounter: Payer: Self-pay | Admitting: *Deleted

## 2023-01-23 ENCOUNTER — Inpatient Hospital Stay (HOSPITAL_BASED_OUTPATIENT_CLINIC_OR_DEPARTMENT_OTHER): Payer: Medicare Other | Admitting: Internal Medicine

## 2023-01-23 ENCOUNTER — Inpatient Hospital Stay: Payer: Medicare Other | Attending: Nurse Practitioner

## 2023-01-23 VITALS — BP 117/64 | HR 62 | Temp 97.9°F | Resp 18 | Wt 127.0 lb

## 2023-01-23 DIAGNOSIS — R600 Localized edema: Secondary | ICD-10-CM | POA: Diagnosis not present

## 2023-01-23 DIAGNOSIS — E876 Hypokalemia: Secondary | ICD-10-CM | POA: Diagnosis not present

## 2023-01-23 DIAGNOSIS — C9000 Multiple myeloma not having achieved remission: Secondary | ICD-10-CM

## 2023-01-23 DIAGNOSIS — Z79899 Other long term (current) drug therapy: Secondary | ICD-10-CM | POA: Insufficient documentation

## 2023-01-23 DIAGNOSIS — R590 Localized enlarged lymph nodes: Secondary | ICD-10-CM | POA: Diagnosis not present

## 2023-01-23 DIAGNOSIS — Z5112 Encounter for antineoplastic immunotherapy: Secondary | ICD-10-CM | POA: Diagnosis not present

## 2023-01-23 DIAGNOSIS — R197 Diarrhea, unspecified: Secondary | ICD-10-CM | POA: Insufficient documentation

## 2023-01-23 DIAGNOSIS — D508 Other iron deficiency anemias: Secondary | ICD-10-CM | POA: Diagnosis not present

## 2023-01-23 DIAGNOSIS — Z7961 Long term (current) use of immunomodulator: Secondary | ICD-10-CM | POA: Insufficient documentation

## 2023-01-23 DIAGNOSIS — D649 Anemia, unspecified: Secondary | ICD-10-CM | POA: Insufficient documentation

## 2023-01-23 DIAGNOSIS — Z79620 Long term (current) use of immunosuppressive biologic: Secondary | ICD-10-CM | POA: Diagnosis not present

## 2023-01-23 DIAGNOSIS — M542 Cervicalgia: Secondary | ICD-10-CM | POA: Insufficient documentation

## 2023-01-23 DIAGNOSIS — I10 Essential (primary) hypertension: Secondary | ICD-10-CM | POA: Insufficient documentation

## 2023-01-23 DIAGNOSIS — E119 Type 2 diabetes mellitus without complications: Secondary | ICD-10-CM | POA: Diagnosis not present

## 2023-01-23 DIAGNOSIS — I509 Heart failure, unspecified: Secondary | ICD-10-CM

## 2023-01-23 DIAGNOSIS — D509 Iron deficiency anemia, unspecified: Secondary | ICD-10-CM | POA: Insufficient documentation

## 2023-01-23 LAB — CBC WITH DIFFERENTIAL/PLATELET
Abs Immature Granulocytes: 0.01 10*3/uL (ref 0.00–0.07)
Basophils Absolute: 0 10*3/uL (ref 0.0–0.1)
Basophils Relative: 1 %
Eosinophils Absolute: 0.4 10*3/uL (ref 0.0–0.5)
Eosinophils Relative: 9 %
HCT: 33 % — ABNORMAL LOW (ref 36.0–46.0)
Hemoglobin: 10.7 g/dL — ABNORMAL LOW (ref 12.0–15.0)
Immature Granulocytes: 0 %
Lymphocytes Relative: 38 %
Lymphs Abs: 1.5 10*3/uL (ref 0.7–4.0)
MCH: 32.8 pg (ref 26.0–34.0)
MCHC: 32.4 g/dL (ref 30.0–36.0)
MCV: 101.2 fL — ABNORMAL HIGH (ref 80.0–100.0)
Monocytes Absolute: 0.5 10*3/uL (ref 0.1–1.0)
Monocytes Relative: 12 %
Neutro Abs: 1.6 10*3/uL — ABNORMAL LOW (ref 1.7–7.7)
Neutrophils Relative %: 40 %
Platelets: 147 10*3/uL — ABNORMAL LOW (ref 150–400)
RBC: 3.26 MIL/uL — ABNORMAL LOW (ref 3.87–5.11)
RDW: 15.3 % (ref 11.5–15.5)
WBC: 4 10*3/uL (ref 4.0–10.5)
nRBC: 0 % (ref 0.0–0.2)

## 2023-01-23 LAB — COMPREHENSIVE METABOLIC PANEL
ALT: 14 U/L (ref 0–44)
AST: 17 U/L (ref 15–41)
Albumin: 3.7 g/dL (ref 3.5–5.0)
Alkaline Phosphatase: 50 U/L (ref 38–126)
Anion gap: 9 (ref 5–15)
BUN: 18 mg/dL (ref 8–23)
CO2: 25 mmol/L (ref 22–32)
Calcium: 8.7 mg/dL — ABNORMAL LOW (ref 8.9–10.3)
Chloride: 101 mmol/L (ref 98–111)
Creatinine, Ser: 1.01 mg/dL — ABNORMAL HIGH (ref 0.44–1.00)
GFR, Estimated: 54 mL/min — ABNORMAL LOW (ref >60)
Glucose, Bld: 134 mg/dL — ABNORMAL HIGH (ref 70–99)
Potassium: 3.7 mmol/L (ref 3.5–5.1)
Sodium: 135 mmol/L (ref 135–145)
Total Bilirubin: 0.8 mg/dL (ref <1.2)
Total Protein: 6.2 g/dL — ABNORMAL LOW (ref 6.5–8.1)

## 2023-01-23 LAB — IRON AND TIBC
Iron: 92 ug/dL (ref 28–170)
Saturation Ratios: 33 % — ABNORMAL HIGH (ref 10.4–31.8)
TIBC: 276 ug/dL (ref 250–450)
UIBC: 184 ug/dL

## 2023-01-23 LAB — BRAIN NATRIURETIC PEPTIDE: B Natriuretic Peptide: 93.8 pg/mL (ref 0.0–100.0)

## 2023-01-23 MED ORDER — DARATUMUMAB-HYALURONIDASE-FIHJ 1800-30000 MG-UT/15ML ~~LOC~~ SOLN
1800.0000 mg | Freq: Once | SUBCUTANEOUS | Status: AC
  Administered 2023-01-23: 1800 mg via SUBCUTANEOUS
  Filled 2023-01-23: qty 15

## 2023-01-23 NOTE — Progress Notes (Signed)
Pt reports taking premeds prior to treatment today.

## 2023-01-23 NOTE — Progress Notes (Signed)
Usually evaluated  Cancer Center CONSULT NOTE  Patient Care Team: Duanne Limerick, MD as PCP - General (Family Medicine) Lajean Manes, [facility] (Inactive) (Pharmacist) Earna Coder, MD as Consulting Physician (Oncology)  CHIEF COMPLAINTS/PURPOSE OF CONSULTATION: Multiple myeloma  Oncology History Overview Note  # MULTIPLE MYELOMA  [FEB 2023-hypercalcemia status changes]-Active [bone lesions; hypercalcemia; anemia; No renal insuffiencey;Bone marrow-32% plasma cell-   STANDARD Cytogenetics].FEB 2023- S/p dexamethasone 20 mg q day x4.   # REV [15 mg -3 week-On and 1 week-OFF]-DARA; May 2023-discontinued rev 15 diarrhea/hypotension  # MAY 2023- dara-Rev 10 mg 3w/1w   # FEB 2023-hypercalcemia [[facility]-status post calcitonin; bisphosphonate]  BONE MARROW, ASPIRATE, CLOT, CORE:  -Hypercellular bone marrow with plasma cell neoplasm  -See comment   PERIPHERAL BLOOD:  -Normocytic-normochromic anemia   COMMENT:   The bone marrow is hypercellular for age with increased number of  atypical plasma cells representing 32% of all cells in the aspirate  associated with interstitial infiltrates and numerous variably sized  clusters in the clot/biopsy sections.  The plasma cells display weak  kappa light chain restriction consistent with plasma cell neoplasm.  Correlation with cytogenetic and FISH studies is recommended   MICROSCOPIC DESCRIPTION:   PERIPHERAL BLOOD SMEAR: The red blood cells display mild  anisopoikilocytosis with mild polychromasia.  The white blood cells are  normal number with scattered hypogranular neutrophils. An occasional  myelocyte and large atypical mononuclear cells are seen on scan. The  platelets are normal in number.    Multiple myeloma not having achieved remission (HCC)  05/15/2021 Initial Diagnosis   Multiple myeloma not having achieved remission (HCC)   06/27/2021 - 10/17/2021 Chemotherapy   Patient is on Treatment Plan : MYELOMA Daratumumab SQ  + Lenalidomide + Dexamethasone (DaraRd) q28d     06/27/2021 -  Chemotherapy   Patient is on Treatment Plan : MYELOMA RELAPSED REFRACTORY Daratumumab SQ + Lenalidomide + Dexamethasone (DaraRd) q28d     07/11/2021 Cancer Staging   Staging form: Plasma Cell Myeloma and Plasma Cell Disorders, AJCC 8th Edition - Clinical: Beta-2-microglobulin (mg/L): 2.6, Albumin (g/dL): 3.9, ISS: Stage I - Signed by Earna Coder, MD on 07/11/2021 Stage prefix: Initial diagnosis Beta 2 microglobulin range (mg/L): Less than 3.5 Albumin range (g/dL): Greater than or equal to 3.5     HISTORY OF PRESENTING ILLNESS: pt in a wheel chair; accompanied by her daughter.   Cristina Morgan 88 y.o.  female with  multiple myeloma currently on Rev-Dex is proceed with Dara SQ today.    Pt wakes up with a headaches off and on. Has tenderness in Left neck has a small swollen lymph node.  Appetite is lower. No nausea. Bowels normal. Gets diarrhea from glucerna. Still drinks glucerna.   SOB with exertion; no worsening swelling in legs no cough. No worsening diarrhea.   Denies any nausea vomiting.  Denies any blood in stools or black-colored stools.  Denies any worsening joint pains.   Review of Systems  Constitutional:  Positive for malaise/fatigue and weight loss. Negative for chills, diaphoresis and fever.  HENT:  Negative for nosebleeds and sore throat.   Eyes:  Negative for double vision.  Respiratory:  Negative for cough, hemoptysis, sputum production, shortness of breath and wheezing.   Cardiovascular:  Negative for chest pain, palpitations, orthopnea and leg swelling.  Gastrointestinal:  Negative for abdominal pain, blood in stool, constipation, diarrhea, heartburn, melena, nausea and vomiting.  Genitourinary:  Negative for dysuria, frequency and urgency.  Musculoskeletal:  Positive  for back pain and joint pain.  Skin: Negative.  Negative for itching and rash.  Neurological:  Negative for dizziness, tingling,  focal weakness, weakness and headaches.  Endo/Heme/Allergies:  Does not bruise/bleed easily.  Psychiatric/Behavioral:  Negative for depression. The patient is not nervous/anxious and does not have insomnia.     MEDICAL HISTORY:  Past Medical History:  Diagnosis Date   Anemia    CAD (coronary artery disease)    Cataracts, bilateral    Closed compression fracture of first lumbar vertebra (HCC)    Closed compression fracture of second lumbar vertebra (HCC)    Diabetes mellitus without complication (HCC)    High cholesterol    History of pneumonia 2010   [place]   History of uterine fibroid    Hypercalcemia    Hyperlipidemia    Hypertension    Lytic bone lesions on xray    Multiple myeloma (HCC)    Osteoporosis    Primary osteoarthritis of right knee     SURGICAL HISTORY: Past Surgical History:  Procedure Laterality Date   CATARACT EXTRACTION Bilateral 2014   COLONOSCOPY  2011   cleared for 5 yrs- [facility]   ECTOPIC PREGNANCY SURGERY      SOCIAL HISTORY: Social History   Socioeconomic History   Marital status: Married    Spouse name: Not on file   Number of children: 2   Years of education: Not on file   Highest education level: 12th grade  Occupational History   Occupation: Retired  Tobacco Use   Smoking status: Never    Passive exposure: Never   Smokeless tobacco: Never   Tobacco comments:    smoking cessation materials not required  Vaping Use   Vaping status: Never Used  Substance and Sexual Activity   Alcohol use: No   Drug use: No   Sexual activity: Not Currently  Other Topics Concern   Not on file  Social History Narrative   [place] with husband; drives. Never smoked; no alcohol. Daughters live close.    Social Determinants of Health   Financial Resource Strain: Low Risk  (11/19/2022)   Overall Financial Resource Strain (CARDIA)    Difficulty of Paying Living Expenses: Not very hard  Food Insecurity: No Food Insecurity (11/19/2022)   Hunger  Vital Sign    Worried About Running Out of Food in the Last Year: Never true    Ran Out of Food in the Last Year: Never true  Transportation Needs: No Transportation Needs (11/19/2022)   PRAPARE - Administrator, Civil Service (Medical): No    Lack of Transportation (Non-Medical): No  Physical Activity: Sufficiently Active (11/19/2022)   Exercise Vital Sign    Days of Exercise per Week: 5 days    Minutes of Exercise per Session: 30 min  Stress: No Stress Concern Present (11/19/2022)   [employer] of Occupational Health - Occupational Stress Questionnaire    Feeling of Stress : Not at all  Social Connections: Moderately Integrated (11/19/2022)   Social Connection and Isolation Panel [NHANES]    Frequency of Communication with Friends and Family: Twice a week    Frequency of Social Gatherings with Friends and Family: More than three times a week    Attends Religious Services: More than 4 times per year    Active Member of [employer] or Organizations: No    Attends Banker Meetings: Never    Marital Status: Married  Catering manager Violence: Not At Risk (11/19/2022)   Humiliation,  Afraid, Rape, and Kick questionnaire    Fear of Current or Ex-Partner: No    Emotionally Abused: No    Physically Abused: No    Sexually Abused: No    FAMILY HISTORY: Family History  Problem Relation Age of Onset   Cancer Mother        breast   Heart disease Mother    Diabetes Father    Heart disease Father    Heart attack Father    Heart attack Brother    Diabetes Brother     ALLERGIES:  is allergic to latex and penicillins.  MEDICATIONS:  Current Outpatient Medications  Medication Sig Dispense Refill   Accu-Chek Softclix Lancets lancets Use as instructed 100 each 0   acetaminophen (TYLENOL) 325 MG tablet Take 325 mg by mouth 2 (two) times daily. START 2 days prior to chemo infusion. Take For 2 days; Do NOT take on the day of infusion     acyclovir (ZOVIRAX) 400 MG tablet TAKE  1 TABLET BY MOUTH TWICE DAILY 60 tablet 10   aspirin EC 81 MG tablet Take 81 mg by mouth daily. Swallow whole.     BD PEN NEEDLE NANO 2ND GEN 32G X 4 MM MISC USE 1  ONCE DAILY 100 each 0   Blood Glucose Monitoring Suppl (ACCU-CHEK GUIDE) w/Device KIT USE AS DIRECTED TO CHECK GLUCOSE     Calcium Carbonate-Vit D-Min (CALCIUM 1200 PO) Take 1 capsule by mouth daily.     carvedilol (COREG) 3.125 MG tablet TAKE 1 TABLET BY MOUTH TWICE DAILY 60 tablet 10   cholecalciferol (VITAMIN D3) 25 MCG (1000 UNIT) tablet Take 1,000 Units by mouth daily.     dexamethasone (DECADRON) 4 MG tablet Take 5 tablets (20 mg total) by mouth as directed. Take one hour before monthly Darzalex injections. 15 tablet 3   diphenoxylate-atropine (LOMOTIL) 2.5-0.025 MG tablet TAKE 1 TABLET BY MOUTH FOUR TIMES DAILY AS NEEDED FOR DIARRHEA OR LOOSE STOOLS *TAKE WITH IMMODIUM* 60 tablet 2   furosemide (LASIX) 20 MG tablet Take 1 tablet (20 mg total) by mouth every morning. 30 tablet 1   Glucerna (GLUCERNA) LIQD Take 237 mLs by mouth.     glucose blood (ACCU-CHEK GUIDE) test strip Use as instructed 100 each 0   hydrOXYzine (ATARAX) 10 MG tablet Take 1 tablet (10 mg total) by mouth at bedtime as needed. 90 tablet 1   Infant Foods (GOOD START 2 ESSENTIALS/IRON PO) Take 1 tablet by mouth daily.     insulin glargine (LANTUS SOLOSTAR) 100 UNIT/ML Solostar Pen Inject 10 Units into the skin daily. 6 units every day except Wed and Thursday- 8 units 15 mL 0   lenalidomide (REVLIMID) 10 MG capsule Take 1 capsule (10 mg total) by mouth daily. Take for 21 days, then hold for 7 days. Repeat every 28 days. 21 capsule 0   losartan (COZAAR) 25 MG tablet TAKE ONE TABLET BY MOUTH EVERY EVENING 90 tablet 0   losartan (COZAAR) 50 MG tablet Take 1 tablet (50 mg total) by mouth every evening. 90 tablet 0   lovastatin (MEVACOR) 40 MG tablet TAKE ONE TABLET BY MOUTH EVERY evening 90 tablet 3   metFORMIN (GLUCOPHAGE) 1000 MG tablet Take 1 tablet (1,000 mg  total) by mouth 2 (two) times daily. 180 tablet 1   montelukast (SINGULAIR) 10 MG tablet STARTING 2 DAYS BEFORE INFUSION, TAKE 1 TABLET BY MOUTH ONCE DAILY *DO NOT TAKE ON DAY OF INFUSION* 20 tablet 10   ondansetron (ZOFRAN) 8  MG tablet One pill every 8 hours as needed for nausea/vomitting. 40 tablet 1   pantoprazole (PROTONIX) 40 MG tablet Take 1 tablet (40 mg total) by mouth daily. 90 tablet 1   prochlorperazine (COMPAZINE) 10 MG tablet Take 1 tablet (10 mg total) by mouth every 6 (six) hours as needed for nausea or vomiting. 40 tablet 1   sertraline (ZOLOFT) 25 MG tablet TAKE ONE TABLET BY MOUTH ONCE DAILY 90 tablet 1   triamcinolone (NASACORT) 55 MCG/ACT AERO nasal inhaler 2 sprays 2 (two) times daily.     No current facility-administered medications for this visit.    PHYSICAL EXAMINATION:  Vitals:   01/23/23 0935  BP: 117/64  Pulse: 62  Resp: 18  Temp: 97.9 F (36.6 C)  SpO2: 100%     Filed Weights   01/23/23 0935  Weight: 127 lb (57.6 kg)      Physical Exam Vitals and nursing note reviewed.  HENT:     Head: Normocephalic and atraumatic.     Mouth/Throat:     Pharynx: Oropharynx is clear.  Eyes:     Extraocular Movements: Extraocular movements intact.     Pupils: Pupils are equal, round, and reactive to light.  Cardiovascular:     Rate and Rhythm: Normal rate and regular rhythm.  Pulmonary:     Comments: Decreased breath sounds bilaterally.  Abdominal:     Palpations: Abdomen is soft.  Musculoskeletal:        General: Normal range of motion.     Cervical back: Normal range of motion.  Skin:    General: Skin is warm.  Neurological:     General: No focal deficit present.     Mental Status: She is alert and oriented to person, place, and time.  Psychiatric:        Behavior: Behavior normal.        Judgment: Judgment normal.      LABORATORY DATA:  I have reviewed the data as listed Lab Results  Component Value Date   WBC 4.0 01/23/2023   HGB 10.7  (L) 01/23/2023   HCT 33.0 (L) 01/23/2023   MCV 101.2 (H) 01/23/2023   PLT 147 (L) 01/23/2023   Recent Labs    10/31/22 0955 11/28/22 0903 12/26/22 1329  NA 135 134* 134*  K 3.2* 3.6 3.3*  CL 103 105 104  CO2 24 24 24   GLUCOSE 137* 110* 149*  BUN 17 12 14   CREATININE 1.12* 0.97 1.04*  CALCIUM 9.0 8.7* 9.0  GFRNONAA 48* 57* 52*  PROT 6.7 5.9* 6.3*  ALBUMIN 4.1 3.5 3.8  AST 17 19 18   ALT 11 12 11   ALKPHOS 52 47 53  BILITOT 0.7 0.4 0.7    RADIOGRAPHIC STUDIES: I have personally reviewed the radiological images as listed and agreed with the findings in the report. No results found.  Assessment and plan Multiple myeloma not having achieved remission (HCC) # STAGE I- MULTIPLE MYELOMA -Active [bone lesions; hypercalcemia; anemia; No renal insuffiencey;Bone marrow-32% plasma cell-   STANDARD Cytogenetics].  Currently on Revlimid-Dex- Dara SQ. Partial response noted;  JULY 2024-M protein = NEG;  K/L RATIO= 1. 6= WNL;  Stable.    # Labs reviewed.  ANC 1.6 noted.  She is on Revlimid 10 mg 3-week on 1 week off.  Her last dose will be tomorrow.  Took Dex 20 mg at home.  Will proceed with Dara today.  Myeloma panel is pending.   #Reflux-like symptoms-  Secondary to dexamethasone; on PPI-  continue low-dose 4 mg weekly/WED.  and wants to hold off GI evaluation [per daughter]- improved. Stable.    # Anemia-moderate- multifactorial-MM/ IDA-  continue trial of gentle iron. Hb [JAn 2024]- 9-10  s/p Venofer 200 mg x 3 doses.  Recheck iron level today.   # Hypokalemia- recommend dietary changes. Hold off script- stable.  Will add CMP today.   # Mild swelling in leg/ likely dependant/compression- no concerns of DVT- stable. on lasix 20 mg/day.     # Diarrhea- improved on Revlimid 10 mg a day- 3 weeks-ON & 1 week OFF. Stable; refilled lomotil- stable.    # Diabetes-on oral hypoglycemic agents; on long acting insulin; 14 units of lantus on T/W/Thursday- Defer to PCP. PBF- BG-237  stable.     #  labile BP- recent hypotension- but in general HTN: continue losartan; continue coreg; BP- 150-160- refilled coreg; monitor for now. stable.    # Hypercalcemia secondary to multiple myeloma- [FEB 2023-Zometa ; calcitonin]-calcium today is 9.6. Zometa q 63M-last received in October 2024.   # Weight loss-s/p re-veluation with Joli -. stable.    #DVT/shingles prophylaxis: Aspirin/acyclovirstable.   # Vaccinations; Ok with flu/COVID shot   # IV access:PIV    Shela [phone]    # DISPOSITION:  # Proceed with Dara SQ today. # RTC in 4 weeks for follow-up with Dr. Leonard Schwartz, labs, Dara    All questions were answered. The patient knows to call the clinic with any problems, questions or concerns.    Michaelyn Barter, MD 01/23/2023 11:25 AM

## 2023-01-23 NOTE — Progress Notes (Signed)
Has had a cough for 3 weeks now. Has been taking diabetic tussin syrup. Appetite is not goo. Everything has a bitter taste, drinking liquids . Glucerna causes her to run to [facility] for 1 episode or so. Has imodium which does help.

## 2023-01-23 NOTE — Patient Instructions (Signed)
 [facility] - A DEPT OF [facility]  Discharge Instructions: Thank you for choosing [facility] to provide your oncology and hematology care.  If you have a lab appointment with the Cancer Center, please go directly to the Cancer Center and check in at the registration area.  Wear comfortable clothing and clothing appropriate for easy access to any Portacath or PICC line.   We strive to give you quality time with your provider. You may need to reschedule your appointment if you arrive late (15 or more minutes).  Arriving late affects you and other patients whose appointments are after yours.  Also, if you miss three or more appointments without notifying the office, you may be dismissed from the clinic at the provider's discretion.      For prescription refill requests, have your pharmacy contact our office and allow 72 hours for refills to be completed.    Today you received the following chemotherapy and/or immunotherapy agents Darzalex   To help prevent nausea and vomiting after your treatment, we encourage you to take your nausea medication as directed.  BELOW ARE SYMPTOMS THAT SHOULD BE REPORTED IMMEDIATELY: *FEVER GREATER THAN 100.4 F (38 C) OR HIGHER *CHILLS OR SWEATING *NAUSEA AND VOMITING THAT IS NOT CONTROLLED WITH YOUR NAUSEA MEDICATION *UNUSUAL SHORTNESS OF BREATH *UNUSUAL BRUISING OR BLEEDING *URINARY PROBLEMS (pain or burning when urinating, or frequent urination) *BOWEL PROBLEMS (unusual diarrhea, constipation, pain near the anus) TENDERNESS IN MOUTH AND THROAT WITH OR WITHOUT PRESENCE OF ULCERS (sore throat, sores in mouth, or a toothache) UNUSUAL RASH, SWELLING OR PAIN  UNUSUAL VAGINAL DISCHARGE OR ITCHING   Items with * indicate a potential emergency and should be followed up as soon as possible or go to the Emergency Department if any problems should occur.  Please show the CHEMOTHERAPY ALERT CARD or IMMUNOTHERAPY ALERT  CARD at check-in to the Emergency Department and triage nurse.  Should you have questions after your visit or need to cancel or reschedule your appointment, please contact [facility] - A DEPT OF [facility]  [phone] and follow the prompts.  Office hours are 8:00 a.m. to 4:30 p.m. Monday - Friday. Please note that voicemails left after 4:00 p.m. may not be returned until the following business day.  We are closed weekends and major holidays. You have access to a nurse at all times for urgent questions. Please call the main number to the clinic [phone] and follow the prompts.  For any non-urgent questions, you may also contact your provider using MyChart. We now offer e-Visits for anyone 23 and older to request care online for non-urgent symptoms. For details visit [email or web address].   Also download the MyChart app! Go to the app store, search "MyChart", open the app, select [facility], and log in with your MyChart username and password.

## 2023-01-23 NOTE — Progress Notes (Signed)
Nutrition Follow-up:   Patient with multiple myeloma, receiving daratumumab and revlimid.    Met with patient during infusion.  Reports that food continues to taste bitter.  Says that she has loose stool 3-4 times a day. Associates diarrhea with drinking glucerna shakes and/or eating chocolate.  Yesterday ate a soup, nabs, eggs and spam.  LIkes to snack on fruit cup.  Says that she has a cold that she got from her grand children.     Medications: lomotil  Labs: glucose 134  Anthropometrics:   Weight 127 lb on 11/8 126 lb 14.4 oz on 10/11 136 lb on 7/19 131 lb 8 oz on 4/19 128 lb on 2/23 130 lb on 1/26  NUTRITION DIAGNOSIS: Inadequate oral intake ongoing    INTERVENTION:  Reviewed eating q 2 hours (small meals/snacks) Continue antidiarrheal medications    MONITORING, EVALUATION, GOAL: weight trends, intake   NEXT VISIT: Friday, Dec 6 during infusion  Cristina Morgan B. Freida Busman, RD, LDN Registered Dietitian [phone]

## 2023-01-25 LAB — THYROID PANEL WITH TSH
Free Thyroxine Index: 1.8 (ref 1.2–4.9)
T3 Uptake Ratio: 25 % (ref 24–39)
T4, Total: 7 ug/dL (ref 4.5–12.0)
TSH: 1.33 u[IU]/mL (ref 0.450–4.500)

## 2023-01-26 LAB — KAPPA/LAMBDA LIGHT CHAINS
Kappa free light chain: 22.1 mg/L — ABNORMAL HIGH (ref 3.3–19.4)
Kappa, lambda light chain ratio: 1.39 (ref 0.26–1.65)
Lambda free light chains: 15.9 mg/L (ref 5.7–26.3)

## 2023-01-28 LAB — MULTIPLE MYELOMA PANEL, SERUM
Albumin SerPl Elph-Mcnc: 3.9 g/dL (ref 2.9–4.4)
Albumin/Glob SerPl: 2.1 — ABNORMAL HIGH (ref 0.7–1.7)
Alpha 1: 0.2 g/dL (ref 0.0–0.4)
Alpha2 Glob SerPl Elph-Mcnc: 0.6 g/dL (ref 0.4–1.0)
B-Globulin SerPl Elph-Mcnc: 0.7 g/dL (ref 0.7–1.3)
Gamma Glob SerPl Elph-Mcnc: 0.5 g/dL (ref 0.4–1.8)
Globulin, Total: 1.9 g/dL — ABNORMAL LOW (ref 2.2–3.9)
IgA: 90 mg/dL (ref 64–422)
IgG (Immunoglobin G), Serum: 603 mg/dL (ref 586–1602)
IgM (Immunoglobulin M), Srm: 20 mg/dL — ABNORMAL LOW (ref 26–217)
Total Protein ELP: 5.8 g/dL — ABNORMAL LOW (ref 6.0–8.5)

## 2023-01-30 ENCOUNTER — Other Ambulatory Visit: Payer: Self-pay | Admitting: Family Medicine

## 2023-01-30 DIAGNOSIS — Z7984 Long term (current) use of oral hypoglycemic drugs: Secondary | ICD-10-CM

## 2023-02-18 ENCOUNTER — Encounter: Payer: Self-pay | Admitting: Internal Medicine

## 2023-02-20 ENCOUNTER — Inpatient Hospital Stay: Payer: Medicare Other | Attending: Nurse Practitioner

## 2023-02-20 ENCOUNTER — Inpatient Hospital Stay (HOSPITAL_BASED_OUTPATIENT_CLINIC_OR_DEPARTMENT_OTHER): Payer: Medicare Other | Admitting: Internal Medicine

## 2023-02-20 ENCOUNTER — Inpatient Hospital Stay: Payer: Medicare Other

## 2023-02-20 ENCOUNTER — Other Ambulatory Visit: Payer: Medicare Other

## 2023-02-20 ENCOUNTER — Other Ambulatory Visit: Payer: Self-pay | Admitting: *Deleted

## 2023-02-20 ENCOUNTER — Ambulatory Visit: Payer: Medicare Other

## 2023-02-20 ENCOUNTER — Ambulatory Visit: Payer: Medicare Other | Admitting: Internal Medicine

## 2023-02-20 VITALS — BP 127/53 | HR 72 | Temp 97.4°F | Ht 66.0 in | Wt 124.4 lb

## 2023-02-20 DIAGNOSIS — Z7961 Long term (current) use of immunomodulator: Secondary | ICD-10-CM | POA: Insufficient documentation

## 2023-02-20 DIAGNOSIS — C9000 Multiple myeloma not having achieved remission: Secondary | ICD-10-CM

## 2023-02-20 DIAGNOSIS — Z79899 Other long term (current) drug therapy: Secondary | ICD-10-CM | POA: Insufficient documentation

## 2023-02-20 DIAGNOSIS — Z79624 Long term (current) use of inhibitors of nucleotide synthesis: Secondary | ICD-10-CM | POA: Insufficient documentation

## 2023-02-20 DIAGNOSIS — Z7982 Long term (current) use of aspirin: Secondary | ICD-10-CM | POA: Insufficient documentation

## 2023-02-20 DIAGNOSIS — I509 Heart failure, unspecified: Secondary | ICD-10-CM | POA: Diagnosis not present

## 2023-02-20 LAB — CBC WITH DIFFERENTIAL/PLATELET
Abs Immature Granulocytes: 0.03 10*3/uL (ref 0.00–0.07)
Basophils Absolute: 0 10*3/uL (ref 0.0–0.1)
Basophils Relative: 1 %
Eosinophils Absolute: 0.1 10*3/uL (ref 0.0–0.5)
Eosinophils Relative: 3 %
HCT: 30.8 % — ABNORMAL LOW (ref 36.0–46.0)
Hemoglobin: 10.1 g/dL — ABNORMAL LOW (ref 12.0–15.0)
Immature Granulocytes: 1 %
Lymphocytes Relative: 26 %
Lymphs Abs: 1.1 10*3/uL (ref 0.7–4.0)
MCH: 33.6 pg (ref 26.0–34.0)
MCHC: 32.8 g/dL (ref 30.0–36.0)
MCV: 102.3 fL — ABNORMAL HIGH (ref 80.0–100.0)
Monocytes Absolute: 0.4 10*3/uL (ref 0.1–1.0)
Monocytes Relative: 10 %
Neutro Abs: 2.4 10*3/uL (ref 1.7–7.7)
Neutrophils Relative %: 59 %
Platelets: 146 10*3/uL — ABNORMAL LOW (ref 150–400)
RBC: 3.01 MIL/uL — ABNORMAL LOW (ref 3.87–5.11)
RDW: 15.1 % (ref 11.5–15.5)
WBC: 4 10*3/uL (ref 4.0–10.5)
nRBC: 0 % (ref 0.0–0.2)

## 2023-02-20 LAB — COMPREHENSIVE METABOLIC PANEL
ALT: 17 U/L (ref 0–44)
AST: 19 U/L (ref 15–41)
Albumin: 3.9 g/dL (ref 3.5–5.0)
Alkaline Phosphatase: 46 U/L (ref 38–126)
Anion gap: 10 (ref 5–15)
BUN: 12 mg/dL (ref 8–23)
CO2: 24 mmol/L (ref 22–32)
Calcium: 8.9 mg/dL (ref 8.9–10.3)
Chloride: 104 mmol/L (ref 98–111)
Creatinine, Ser: 0.73 mg/dL (ref 0.44–1.00)
GFR, Estimated: >60 mL/min (ref >60)
Glucose, Bld: 160 mg/dL — ABNORMAL HIGH (ref 70–99)
Potassium: 2.6 mmol/L — CL (ref 3.5–5.1)
Sodium: 138 mmol/L (ref 135–145)
Total Bilirubin: 0.8 mg/dL (ref <1.2)
Total Protein: 6.4 g/dL — ABNORMAL LOW (ref 6.5–8.1)

## 2023-02-20 MED ORDER — POTASSIUM CHLORIDE CRYS ER 20 MEQ PO TBCR: EXTENDED_RELEASE_TABLET | ORAL | 3 refills | Status: DC

## 2023-02-20 NOTE — Progress Notes (Signed)
Usually evaluated [facility] CONSULT NOTE  Patient Care Team: Duanne Limerick, MD as PCP - General (Family Medicine) Lajean Manes, [facility] (Inactive) (Pharmacist) Earna Coder, MD as Consulting Physician (Oncology)  CHIEF COMPLAINTS/PURPOSE OF CONSULTATION: Multiple myeloma  Oncology History Overview Note  # MULTIPLE MYELOMA  [FEB 2023-hypercalcemia status changes]-Active [bone lesions; hypercalcemia; anemia; No renal insuffiencey;Bone marrow-32% plasma cell-   STANDARD Cytogenetics].FEB 2023- S/p dexamethasone 20 mg q day x4.   # REV [15 mg -3 week-On and 1 week-OFF]-DARA; May 2023-discontinued rev 15 diarrhea/hypotension  # MAY 2023- dara-Rev 10 mg 3w/1w   # FEB 2023-hypercalcemia [[facility]-status post calcitonin; bisphosphonate]  BONE MARROW, ASPIRATE, CLOT, CORE:  -Hypercellular bone marrow with plasma cell neoplasm  -See comment   PERIPHERAL BLOOD:  -Normocytic-normochromic anemia   COMMENT:   The bone marrow is hypercellular for age with increased number of  atypical plasma cells representing 32% of all cells in the aspirate  associated with interstitial infiltrates and numerous variably sized  clusters in the clot/biopsy sections.  The plasma cells display weak  kappa light chain restriction consistent with plasma cell neoplasm.  Correlation with cytogenetic and FISH studies is recommended   MICROSCOPIC DESCRIPTION:   PERIPHERAL BLOOD SMEAR: The red blood cells display mild  anisopoikilocytosis with mild polychromasia.  The white blood cells are  normal number with scattered hypogranular neutrophils. An occasional  myelocyte and large atypical mononuclear cells are seen on scan. The  platelets are normal in number.    Multiple myeloma not having achieved remission (HCC)  05/15/2021 Initial Diagnosis   Multiple myeloma not having achieved remission (HCC)   06/27/2021 - 10/17/2021 Chemotherapy   Patient is on Treatment Plan : MYELOMA Daratumumab SQ  + Lenalidomide + Dexamethasone (DaraRd) q28d     06/27/2021 -  Chemotherapy   Patient is on Treatment Plan : MYELOMA RELAPSED REFRACTORY Daratumumab SQ + Lenalidomide + Dexamethasone (DaraRd) q28d     07/11/2021 Cancer Staging   Staging form: Plasma Cell Myeloma and Plasma Cell Disorders, AJCC 8th Edition - Clinical: Beta-2-microglobulin (mg/L): 2.6, Albumin (g/dL): 3.9, ISS: Stage I - Signed by Earna Coder, MD on 07/11/2021 Stage prefix: Initial diagnosis Beta 2 microglobulin range (mg/L): Less than 3.5 Albumin range (g/dL): Greater than or equal to 3.5     HISTORY OF PRESENTING ILLNESS: pt in a wheel chair; accompanied by her daughter.   Cristina Morgan 88 y.o.  female with  multiple myeloma currently on Rev-Dex is proceed with Dara SQ today.    Patient notes to have diarrhea.  3-5 loose stools a day.  Not very consistent with Lomotil. Complains of faitigue.   Appetite is lower. No nausea. Bowels normal.. Still drinks glucerna. Denies any nausea vomiting.  Denies any blood in stools or black-colored stools.  Denies any worsening joint pains.   Review of Systems  Constitutional:  Positive for malaise/fatigue and weight loss. Negative for chills, diaphoresis and fever.  HENT:  Negative for nosebleeds and sore throat.   Eyes:  Negative for double vision.  Respiratory:  Negative for cough, hemoptysis, sputum production, shortness of breath and wheezing.   Cardiovascular:  Negative for chest pain, palpitations, orthopnea and leg swelling.  Gastrointestinal:  Negative for abdominal pain, blood in stool, constipation, diarrhea, heartburn, melena, nausea and vomiting.  Genitourinary:  Negative for dysuria, frequency and urgency.  Musculoskeletal:  Positive for back pain and joint pain.  Skin: Negative.  Negative for itching and rash.  Neurological:  Negative for  dizziness, tingling, focal weakness, weakness and headaches.  Endo/Heme/Allergies:  Does not bruise/bleed easily.   Psychiatric/Behavioral:  Negative for depression. The patient is not nervous/anxious and does not have insomnia.     MEDICAL HISTORY:  Past Medical History:  Diagnosis Date   Anemia    CAD (coronary artery disease)    Cataracts, bilateral    Closed compression fracture of first lumbar vertebra (HCC)    Closed compression fracture of second lumbar vertebra (HCC)    Diabetes mellitus without complication (HCC)    High cholesterol    History of pneumonia 2010   [place]   History of uterine fibroid    Hypercalcemia    Hyperlipidemia    Hypertension    Lytic bone lesions on xray    Multiple myeloma (HCC)    Osteoporosis    Primary osteoarthritis of right knee     SURGICAL HISTORY: Past Surgical History:  Procedure Laterality Date   CATARACT EXTRACTION Bilateral 2014   COLONOSCOPY  2011   cleared for 5 yrs- [facility]   ECTOPIC PREGNANCY SURGERY      SOCIAL HISTORY: Social History   Socioeconomic History   Marital status: Married    Spouse name: Not on file   Number of children: 2   Years of education: Not on file   Highest education level: 12th grade  Occupational History   Occupation: Retired  Tobacco Use   Smoking status: Never    Passive exposure: Never   Smokeless tobacco: Never   Tobacco comments:    smoking cessation materials not required  Vaping Use   Vaping status: Never Used  Substance and Sexual Activity   Alcohol use: No   Drug use: No   Sexual activity: Not Currently  Other Topics Concern   Not on file  Social History Narrative   [place] with husband; drives. Never smoked; no alcohol. Daughters live close.    Social Determinants of Health   Financial Resource Strain: Low Risk  (11/19/2022)   Overall Financial Resource Strain (CARDIA)    Difficulty of Paying Living Expenses: Not very hard  Food Insecurity: No Food Insecurity (11/19/2022)   Hunger Vital Sign    Worried About Running Out of Food in the Last Year: Never true    Ran Out of  Food in the Last Year: Never true  Transportation Needs: No Transportation Needs (11/19/2022)   PRAPARE - Administrator, Civil Service (Medical): No    Lack of Transportation (Non-Medical): No  Physical Activity: Sufficiently Active (11/19/2022)   Exercise Vital Sign    Days of Exercise per Week: 5 days    Minutes of Exercise per Session: 30 min  Stress: No Stress Concern Present (11/19/2022)   [employer] of Occupational Health - Occupational Stress Questionnaire    Feeling of Stress : Not at all  Social Connections: Moderately Integrated (11/19/2022)   Social Connection and Isolation Panel [NHANES]    Frequency of Communication with Friends and Family: Twice a week    Frequency of Social Gatherings with Friends and Family: More than three times a week    Attends Religious Services: More than 4 times per year    Active Member of [employer] or Organizations: No    Attends Banker Meetings: Never    Marital Status: Married  Catering manager Violence: Not At Risk (11/19/2022)   Humiliation, Afraid, Rape, and Kick questionnaire    Fear of Current or Ex-Partner: No    Emotionally Abused: No  Physically Abused: No    Sexually Abused: No    FAMILY HISTORY: Family History  Problem Relation Age of Onset   Cancer Mother        breast   Heart disease Mother    Diabetes Father    Heart disease Father    Heart attack Father    Heart attack Brother    Diabetes Brother     ALLERGIES:  is allergic to latex and penicillins.  MEDICATIONS:  Current Outpatient Medications  Medication Sig Dispense Refill   Accu-Chek Softclix Lancets lancets Use as instructed 100 each 0   acetaminophen (TYLENOL) 325 MG tablet Take 325 mg by mouth 2 (two) times daily. START 2 days prior to chemo infusion. Take For 2 days; Do NOT take on the day of infusion     acyclovir (ZOVIRAX) 400 MG tablet TAKE 1 TABLET BY MOUTH TWICE DAILY 60 tablet 10   aspirin EC 81 MG tablet Take 81 mg by mouth  daily. Swallow whole.     BD PEN NEEDLE NANO 2ND GEN 32G X 4 MM MISC USE 1  ONCE DAILY 100 each 0   Blood Glucose Monitoring Suppl (ACCU-CHEK GUIDE) w/Device KIT USE AS DIRECTED TO CHECK GLUCOSE     Calcium Carbonate-Vit D-Min (CALCIUM 1200 PO) Take 1 capsule by mouth daily.     carvedilol (COREG) 3.125 MG tablet TAKE 1 TABLET BY MOUTH TWICE DAILY 60 tablet 10   cholecalciferol (VITAMIN D3) 25 MCG (1000 UNIT) tablet Take 1,000 Units by mouth daily.     dexamethasone (DECADRON) 4 MG tablet Take 5 tablets (20 mg total) by mouth as directed. Take one hour before monthly Darzalex injections. 15 tablet 3   diphenoxylate-atropine (LOMOTIL) 2.5-0.025 MG tablet TAKE 1 TABLET BY MOUTH FOUR TIMES DAILY AS NEEDED FOR DIARRHEA OR LOOSE STOOLS *TAKE WITH IMMODIUM* 60 tablet 2   furosemide (LASIX) 20 MG tablet Take 1 tablet (20 mg total) by mouth every morning. 30 tablet 1   Glucerna (GLUCERNA) LIQD Take 237 mLs by mouth.     glucose blood (ACCU-CHEK GUIDE) test strip Use as instructed 100 each 0   hydrOXYzine (ATARAX) 10 MG tablet Take 1 tablet (10 mg total) by mouth at bedtime as needed. 90 tablet 1   Infant Foods (GOOD START 2 ESSENTIALS/IRON PO) Take 1 tablet by mouth daily.     insulin glargine (LANTUS SOLOSTAR) 100 UNIT/ML Solostar Pen Inject 10 Units into the skin daily. 6 units every day except Wed and Thursday- 8 units 15 mL 0   lenalidomide (REVLIMID) 10 MG capsule Take 1 capsule (10 mg total) by mouth daily. Take for 21 days, then hold for 7 days. Repeat every 28 days. 21 capsule 0   losartan (COZAAR) 25 MG tablet TAKE ONE TABLET BY MOUTH EVERY EVENING 90 tablet 0   losartan (COZAAR) 50 MG tablet Take 1 tablet (50 mg total) by mouth every evening. 90 tablet 0   lovastatin (MEVACOR) 40 MG tablet TAKE ONE TABLET BY MOUTH EVERY evening 90 tablet 3   metFORMIN (GLUCOPHAGE) 1000 MG tablet TAKE 1 TABLET BY MOUTH TWICE DAILY 60 tablet 10   montelukast (SINGULAIR) 10 MG tablet STARTING 2 DAYS BEFORE  INFUSION, TAKE 1 TABLET BY MOUTH ONCE DAILY *DO NOT TAKE ON DAY OF INFUSION* 20 tablet 10   ondansetron (ZOFRAN) 8 MG tablet One pill every 8 hours as needed for nausea/vomitting. 40 tablet 1   pantoprazole (PROTONIX) 40 MG tablet Take 1 tablet (40 mg total) by  mouth daily. 90 tablet 1   potassium chloride SA (KLOR-CON M) 20 MEQ tablet 1 pill twice a day 60 tablet 3   prochlorperazine (COMPAZINE) 10 MG tablet Take 1 tablet (10 mg total) by mouth every 6 (six) hours as needed for nausea or vomiting. 40 tablet 1   sertraline (ZOLOFT) 25 MG tablet TAKE ONE TABLET BY MOUTH ONCE DAILY 90 tablet 1   triamcinolone (NASACORT) 55 MCG/ACT AERO nasal inhaler 2 sprays 2 (two) times daily.     No current facility-administered medications for this visit.    PHYSICAL EXAMINATION:  Vitals:   02/20/23 0912  BP: (!) 127/53  Pulse: 72  Temp: (!) 97.4 F (36.3 C)  SpO2: 96%      Filed Weights   02/20/23 0912  Weight: 124 lb 6.4 oz (56.4 kg)       Physical Exam Vitals and nursing note reviewed.  HENT:     Head: Normocephalic and atraumatic.     Mouth/Throat:     Pharynx: Oropharynx is clear.  Eyes:     Extraocular Movements: Extraocular movements intact.     Pupils: Pupils are equal, round, and reactive to light.  Cardiovascular:     Rate and Rhythm: Normal rate and regular rhythm.  Pulmonary:     Comments: Decreased breath sounds bilaterally.  Abdominal:     Palpations: Abdomen is soft.  Musculoskeletal:        General: Normal range of motion.     Cervical back: Normal range of motion.  Skin:    General: Skin is warm.  Neurological:     General: No focal deficit present.     Mental Status: She is alert and oriented to person, place, and time.  Psychiatric:        Behavior: Behavior normal.        Judgment: Judgment normal.      LABORATORY DATA:  I have reviewed the data as listed Lab Results  Component Value Date   WBC 4.0 02/20/2023   HGB 10.1 (L) 02/20/2023   HCT  30.8 (L) 02/20/2023   MCV 102.3 (H) 02/20/2023   PLT 146 (L) 02/20/2023   Recent Labs    12/26/22 1329 01/23/23 0920 02/20/23 0854  NA 134* 135 138  K 3.3* 3.7 2.6*  CL 104 101 104  CO2 24 25 24   GLUCOSE 149* 134* 160*  BUN 14 18 12   CREATININE 1.04* 1.01* 0.73  CALCIUM 9.0 8.7* 8.9  GFRNONAA 52* 54* >60  PROT 6.3* 6.2* 6.4*  ALBUMIN 3.8 3.7 3.9  AST 18 17 19   ALT 11 14 17   ALKPHOS 53 50 46  BILITOT 0.7 0.8 0.8    RADIOGRAPHIC STUDIES: I have personally reviewed the radiological images as listed and agreed with the findings in the report. No results found.  Multiple myeloma not having achieved remission (HCC) # STAGE I- MULTIPLE MYELOMA -Active [bone lesions; hypercalcemia; anemia; No renal insuffiencey;Bone marrow-32% plasma cell-   STANDARD Cytogenetics].  Currently on Revlimid-Dex- Dara SQ. Partial response noted;  NOV  2024-M protein = NEG;  K/L RATIO= 1. 6= WNL- stable.   # HOLD  Dara SQ today + DEX 20 mg PO [at home/pre-meds; none today- will givendex in clinic.]; Labs today reviewed;  ALSO HOLD Revlimid at 10 mg dose 3 weeks on 1 week off-  until further directions.   # Hypokalemia severe- K.2.6 multifactorial [glucerna/ ? Revlimid/lasix; unlikely metformin-chronic medication]- Recommend Kdur 20 me BID. Recommend HOLD revlimid.   #Reflux-like symptoms-  Secondary to dexamethasone; on PPI- continue low-dose 4 mg weekly/WED.  and wants to hold off GI evaluation [per daughter]- improved. Stable.   # Anemia-moderate- multifactorial-MM/ IDA-  continue trial of gentle iron. Hb [JAn 2024]- 9-25 August 2022- iron sat-13; stable. Continue venofer.   # Hypokalemia- recommend dietary changes. Hold off script- stable.   # Mild swelling in leg/ likely dependant/compression- no concerns of DVT- stable. on lasix 20 mg/day;   # Diarrhea-WORSE - see above- HOLD Revlimid [10 mg a day- 3 weeks-ON & 1 week OFF] recommend compliance with lomotil-  # Diabetes-on oral hypoglycemic  agents; on long acting insulin; 14 units of lantus on T/W/Thursday- Defer to PCP. PBF- BG-237  stable.    # labile BP- recent hypotension- but in general HTN: continue losartan; continue coreg; BP- 150-160- refilled coreg; monitor for now. stable.   # Hypercalcemia secondary to multiple myeloma- [FEB 2023-Zometa ; calcitonin]-calcium today is 9.6. Zometa q 47M- stable.   # Weight loss-s/p re-veluation with Joli -. stable.   #DVT/shingles prophylaxis: Aspirin/acyclovirstable.  # Vaccinations; Ok with flu/COVID shot  # IV access:PIV   Shela Commons- [phone]  zometa-q 3 months [last  OCT 2024-]- ; MM; K/L q 4 W  # DISPOSITION:  # HOLD  dara SQ; HOLD venofer- # follow up in 6 weeks/Friday  MD-labs- cbc/cmp;BNP ; MM panel;K/L light chains; Dara SQ; possible venofer; Dr.B    All questions were answered. The patient knows to call the clinic with any problems, questions or concerns.    Earna Coder, MD 02/20/2023 10:22 AM

## 2023-02-20 NOTE — Assessment & Plan Note (Addendum)
#   STAGE I- MULTIPLE MYELOMA -Active [bone lesions; hypercalcemia; anemia; No renal insuffiencey;Bone marrow-32% plasma cell-   STANDARD Cytogenetics].  Currently on Revlimid-Dex- Dara SQ. Partial response noted;  NOV  2024-M protein = NEG;  K/L RATIO= 1. 6= WNL- stable.   # HOLD  Dara SQ today + DEX 20 mg PO [at home/pre-meds; none today- will givendex in clinic.]; Labs today reviewed;  ALSO HOLD Revlimid at 10 mg dose 3 weeks on 1 week off-  until further directions.   # Hypokalemia severe- K.2.6 multifactorial [glucerna/ ? Revlimid/lasix; unlikely metformin-chronic medication]- Recommend Kdur 20 me BID. Recommend HOLD revlimid.   #Reflux-like symptoms-  Secondary to dexamethasone; on PPI- continue low-dose 4 mg weekly/WED.  and wants to hold off GI evaluation [per daughter]- improved. Stable.   # Anemia-moderate- multifactorial-MM/ IDA-  continue trial of gentle iron. Hb [JAn 2024]- 9-25 August 2022- iron sat-13; stable. Continue venofer.   # Hypokalemia- recommend dietary changes. Hold off script- stable.   # Mild swelling in leg/ likely dependant/compression- no concerns of DVT- stable. on lasix 20 mg/day;   # Diarrhea-WORSE - see above- HOLD Revlimid [10 mg a day- 3 weeks-ON & 1 week OFF] recommend compliance with lomotil-  # Diabetes-on oral hypoglycemic agents; on long acting insulin; 14 units of lantus on T/W/Thursday- Defer to PCP. PBF- BG-237  stable.    # labile BP- recent hypotension- but in general HTN: continue losartan; continue coreg; BP- 150-160- refilled coreg; monitor for now. stable.   # Hypercalcemia secondary to multiple myeloma- [FEB 2023-Zometa ; calcitonin]-calcium today is 9.6. Zometa q 80M- stable.   # Weight loss-s/p re-veluation with Joli -. stable.   #DVT/shingles prophylaxis: Aspirin/acyclovirstable.  # Vaccinations; Ok with flu/COVID shot  # IV access:PIV   Shela Commons- [phone]  zometa-q 3 months [last  OCT 2024-]- ; MM; K/L q 4 W  # DISPOSITION:   # HOLD  dara SQ; HOLD venofer- # follow up in 6 weeks/Friday  MD-labs- cbc/cmp;BNP ; MM panel;K/L light chains; Dara SQ; possible venofer; Dr.B

## 2023-02-20 NOTE — Progress Notes (Signed)
Critical lab received by Judeth Cornfield in lab Potassium 2.6 Read back. Dr B notified.

## 2023-02-20 NOTE — Progress Notes (Signed)
Appetite 50% normal, Ensure clear 1-3 per day. Taking otc Imodium, pepo tablets.  She is having some diarrhea, going 3+ times a day, depending on what she eats.

## 2023-02-23 LAB — KAPPA/LAMBDA LIGHT CHAINS
Kappa free light chain: 20.1 mg/L — ABNORMAL HIGH (ref 3.3–19.4)
Kappa, lambda light chain ratio: 1.36 (ref 0.26–1.65)
Lambda free light chains: 14.8 mg/L (ref 5.7–26.3)

## 2023-03-02 LAB — MULTIPLE MYELOMA PANEL, SERUM
Albumin SerPl Elph-Mcnc: 4.1 g/dL (ref 2.9–4.4)
Albumin/Glob SerPl: 2.2 — ABNORMAL HIGH (ref 0.7–1.7)
Alpha 1: 0.2 g/dL (ref 0.0–0.4)
Alpha2 Glob SerPl Elph-Mcnc: 0.6 g/dL (ref 0.4–1.0)
B-Globulin SerPl Elph-Mcnc: 0.7 g/dL (ref 0.7–1.3)
Gamma Glob SerPl Elph-Mcnc: 0.5 g/dL (ref 0.4–1.8)
Globulin, Total: 1.9 g/dL — ABNORMAL LOW (ref 2.2–3.9)
IgA: 79 mg/dL (ref 64–422)
IgG (Immunoglobin G), Serum: 598 mg/dL (ref 586–1602)
IgM (Immunoglobulin M), Srm: 13 mg/dL — ABNORMAL LOW (ref 26–217)
Total Protein ELP: 6 g/dL (ref 6.0–8.5)

## 2023-03-07 ENCOUNTER — Other Ambulatory Visit: Payer: Self-pay | Admitting: Internal Medicine

## 2023-03-07 DIAGNOSIS — R600 Localized edema: Secondary | ICD-10-CM

## 2023-03-09 ENCOUNTER — Encounter: Payer: Self-pay | Admitting: Internal Medicine

## 2023-03-22 ENCOUNTER — Telehealth: Payer: Self-pay

## 2023-03-22 NOTE — Telephone Encounter (Signed)
 Oral Oncology Patient Advocate Encounter  Was successful in securing patient a $12,000.00 grant from [facility] to provide copayment coverage for Lenalidomide .  This will keep the out of pocket expense at $0.     [employer] ID: [id number]   The billing information is as follows and has been shared with [facility].    RxBin: [id number] PCN: [id number] Member ID: [id number] Group ID: [id number] Dates of Eligibility: 01/12/23 through 01/11/24  Fund:  Multiple Myeloma - Medicare Access   Morene Potters, CPhT Oncology Pharmacy Patient Advocate  [facility]  [phone] (phone) [phone] (fax)

## 2023-04-03 ENCOUNTER — Other Ambulatory Visit: Payer: Self-pay | Admitting: Internal Medicine

## 2023-04-03 DIAGNOSIS — F5102 Adjustment insomnia: Secondary | ICD-10-CM

## 2023-04-03 DIAGNOSIS — F329 Major depressive disorder, single episode, unspecified: Secondary | ICD-10-CM

## 2023-04-04 ENCOUNTER — Other Ambulatory Visit: Payer: Self-pay | Admitting: Internal Medicine

## 2023-04-04 DIAGNOSIS — C9000 Multiple myeloma not having achieved remission: Secondary | ICD-10-CM

## 2023-04-09 ENCOUNTER — Other Ambulatory Visit: Payer: Self-pay | Admitting: Internal Medicine

## 2023-04-09 DIAGNOSIS — C9000 Multiple myeloma not having achieved remission: Secondary | ICD-10-CM

## 2023-04-10 ENCOUNTER — Inpatient Hospital Stay: Payer: Medicare Other | Attending: Nurse Practitioner

## 2023-04-10 ENCOUNTER — Encounter: Payer: Self-pay | Admitting: Internal Medicine

## 2023-04-10 ENCOUNTER — Inpatient Hospital Stay: Payer: Medicare Other

## 2023-04-10 ENCOUNTER — Inpatient Hospital Stay (HOSPITAL_BASED_OUTPATIENT_CLINIC_OR_DEPARTMENT_OTHER): Payer: Medicare Other | Admitting: Internal Medicine

## 2023-04-10 VITALS — BP 140/49 | HR 66 | Temp 97.9°F | Ht 66.0 in | Wt 128.5 lb

## 2023-04-10 VITALS — BP 168/70 | HR 67 | Temp 97.8°F

## 2023-04-10 DIAGNOSIS — R601 Generalized edema: Secondary | ICD-10-CM | POA: Diagnosis not present

## 2023-04-10 DIAGNOSIS — C9000 Multiple myeloma not having achieved remission: Secondary | ICD-10-CM | POA: Diagnosis not present

## 2023-04-10 DIAGNOSIS — Z5112 Encounter for antineoplastic immunotherapy: Secondary | ICD-10-CM | POA: Diagnosis not present

## 2023-04-10 DIAGNOSIS — I10 Essential (primary) hypertension: Secondary | ICD-10-CM | POA: Insufficient documentation

## 2023-04-10 DIAGNOSIS — M810 Age-related osteoporosis without current pathological fracture: Secondary | ICD-10-CM | POA: Diagnosis not present

## 2023-04-10 DIAGNOSIS — Z7984 Long term (current) use of oral hypoglycemic drugs: Secondary | ICD-10-CM | POA: Diagnosis not present

## 2023-04-10 DIAGNOSIS — I509 Heart failure, unspecified: Secondary | ICD-10-CM

## 2023-04-10 DIAGNOSIS — E7800 Pure hypercholesterolemia, unspecified: Secondary | ICD-10-CM | POA: Diagnosis not present

## 2023-04-10 DIAGNOSIS — E876 Hypokalemia: Secondary | ICD-10-CM | POA: Diagnosis not present

## 2023-04-10 DIAGNOSIS — Z7961 Long term (current) use of immunomodulator: Secondary | ICD-10-CM | POA: Diagnosis not present

## 2023-04-10 DIAGNOSIS — E119 Type 2 diabetes mellitus without complications: Secondary | ICD-10-CM | POA: Insufficient documentation

## 2023-04-10 DIAGNOSIS — E785 Hyperlipidemia, unspecified: Secondary | ICD-10-CM | POA: Diagnosis not present

## 2023-04-10 DIAGNOSIS — R634 Abnormal weight loss: Secondary | ICD-10-CM | POA: Diagnosis not present

## 2023-04-10 DIAGNOSIS — Z794 Long term (current) use of insulin: Secondary | ICD-10-CM | POA: Diagnosis not present

## 2023-04-10 DIAGNOSIS — D630 Anemia in neoplastic disease: Secondary | ICD-10-CM | POA: Diagnosis not present

## 2023-04-10 DIAGNOSIS — R600 Localized edema: Secondary | ICD-10-CM | POA: Diagnosis not present

## 2023-04-10 DIAGNOSIS — I2510 Atherosclerotic heart disease of native coronary artery without angina pectoris: Secondary | ICD-10-CM | POA: Insufficient documentation

## 2023-04-10 DIAGNOSIS — Z79899 Other long term (current) drug therapy: Secondary | ICD-10-CM | POA: Insufficient documentation

## 2023-04-10 LAB — CBC WITH DIFFERENTIAL (CANCER CENTER ONLY)
Abs Immature Granulocytes: 0.01 10*3/uL (ref 0.00–0.07)
Basophils Absolute: 0 10*3/uL (ref 0.0–0.1)
Basophils Relative: 1 %
Eosinophils Absolute: 0.1 10*3/uL (ref 0.0–0.5)
Eosinophils Relative: 1 %
HCT: 34.2 % — ABNORMAL LOW (ref 36.0–46.0)
Hemoglobin: 11.1 g/dL — ABNORMAL LOW (ref 12.0–15.0)
Immature Granulocytes: 0 %
Lymphocytes Relative: 42 %
Lymphs Abs: 1.9 10*3/uL (ref 0.7–4.0)
MCH: 34.7 pg — ABNORMAL HIGH (ref 26.0–34.0)
MCHC: 32.5 g/dL (ref 30.0–36.0)
MCV: 106.9 fL — ABNORMAL HIGH (ref 80.0–100.0)
Monocytes Absolute: 0.5 10*3/uL (ref 0.1–1.0)
Monocytes Relative: 10 %
Neutro Abs: 2.1 10*3/uL (ref 1.7–7.7)
Neutrophils Relative %: 46 %
Platelet Count: 237 10*3/uL (ref 150–400)
RBC: 3.2 MIL/uL — ABNORMAL LOW (ref 3.87–5.11)
RDW: 14.7 % (ref 11.5–15.5)
WBC Count: 4.5 10*3/uL (ref 4.0–10.5)
nRBC: 0 % (ref 0.0–0.2)

## 2023-04-10 LAB — CMP (CANCER CENTER ONLY)
ALT: 15 U/L (ref 0–44)
AST: 21 U/L (ref 15–41)
Albumin: 4.4 g/dL (ref 3.5–5.0)
Alkaline Phosphatase: 51 U/L (ref 38–126)
Anion gap: 8 (ref 5–15)
BUN: 15 mg/dL (ref 8–23)
CO2: 25 mmol/L (ref 22–32)
Calcium: 9.7 mg/dL (ref 8.9–10.3)
Chloride: 102 mmol/L (ref 98–111)
Creatinine: 0.97 mg/dL (ref 0.44–1.00)
GFR, Estimated: 57 mL/min — ABNORMAL LOW (ref >60)
Glucose, Bld: 179 mg/dL — ABNORMAL HIGH (ref 70–99)
Potassium: 4.1 mmol/L (ref 3.5–5.1)
Sodium: 135 mmol/L (ref 135–145)
Total Bilirubin: 0.6 mg/dL (ref 0.0–1.2)
Total Protein: 6.7 g/dL (ref 6.5–8.1)

## 2023-04-10 LAB — BRAIN NATRIURETIC PEPTIDE: B Natriuretic Peptide: 43.6 pg/mL (ref 0.0–100.0)

## 2023-04-10 MED ORDER — DEXAMETHASONE 4 MG PO TABS
20.0000 mg | ORAL_TABLET | Freq: Once | ORAL | Status: AC
  Administered 2023-04-10: 20 mg via ORAL
  Filled 2023-04-10: qty 5

## 2023-04-10 MED ORDER — ACETAMINOPHEN 325 MG PO TABS
650.0000 mg | ORAL_TABLET | Freq: Once | ORAL | Status: AC
  Administered 2023-04-10: 650 mg via ORAL
  Filled 2023-04-10: qty 2

## 2023-04-10 MED ORDER — DARATUMUMAB-HYALURONIDASE-FIHJ 1800-30000 MG-UT/15ML ~~LOC~~ SOLN
1800.0000 mg | Freq: Once | SUBCUTANEOUS | Status: AC
Start: 2023-04-10 — End: 2023-04-10
  Administered 2023-04-10: 1800 mg via SUBCUTANEOUS
  Filled 2023-04-10: qty 15

## 2023-04-10 MED ORDER — DIPHENHYDRAMINE HCL 25 MG PO CAPS
50.0000 mg | ORAL_CAPSULE | Freq: Once | ORAL | Status: AC
  Administered 2023-04-10: 50 mg via ORAL
  Filled 2023-04-10: qty 2

## 2023-04-10 NOTE — Assessment & Plan Note (Addendum)
#   STAGE I- MULTIPLE MYELOMA -Active [bone lesions; hypercalcemia; anemia; No renal insuffiencey;Bone marrow-32% plasma cell-   STANDARD Cytogenetics].  Currently on Revlimid-Dex- Dara SQ. Partial response noted;  JAN 2024-M protein = NEG;  K/L RATIO= 1. 6= WNL- stable.   # Proceed with Dara SQ today + DEX 20 mg PO [at home/pre-meds] Labs today reviewed;  ALSO  continue Revlimid at 10 mg dose 3 weeks on 1 week off-  until further directions.   # Hypokalemia severe-DEC K.2.6 - improved today 4.1-ok to take Kdur once a day.     #Reflux-like symptoms-  Secondary to dexamethasone; on PPI- continue low-dose 4 mg weekly/WED.  and wants to hold off GI evaluation [per daughter]- improved. Stable.   # Anemia-moderate- multifactorial-MM/ IDA-  continue trial of gentle iron. Hb [JAn 2024]- 10111  JUNE 2024- iron sat-13; stable.  # Mild swelling in leg/ likely dependant/compression- no concerns of DVT- stable. on lasix 20 mg/day;   # Diabetes-on oral hypoglycemic agents; on long acting insulin; 14 units of lantus on T/W/Thursday- Defer to PCP. PBF- BG-237  stable.    # labile BP- recent hypotension- but in general HTN: continue losartan; continue coreg; BP- 150-160- refilled coreg; monitor for now. stable.    # Hypercalcemia secondary to multiple myeloma- [FEB 2023-Zometa ; calcitonin]-calcium today is 9.6. Zometa q 93M- stable.   # Weight loss-s/p re-veluation with Joli -. stable.   #DVT/shingles prophylaxis: Aspirin/acyclovirstable.  # Vaccinations; Ok with flu/COVID shot  # IV access:PIV   Shela Commons- [phone]  zometa-q 3 months [last  OCT 2024-]- ; MM; K/L q 4 W  # DISPOSITION:  # Dara SQ; HOLD venofer- # follow up in 4  weeks/Friday  MD-labs- cbc/cmp;BNP; iron studies; ferritin-; MM panel;K/L light chains; Dara SQ; Zometa-Dr.B

## 2023-04-10 NOTE — Progress Notes (Signed)
Nutrition Follow-up:  Patient with multiple myeloma, receiving  Dara SQ and revlimid.    Met with patient in infusion.  Reports that her appetite is pretty good.  Says that her diarrhea is better. Has been eating soups, peanut butter crackers, eggs, toast, pimento cheese.  Glucerna shakes at times.  Says that she has been doing some walking.    Medications: reviewed  Labs: reviewed  Anthropometrics:   Weight 128 lb 8 oz today  127 lb on 11/8 126 lb 14.4 oz on 10/11 136 lb on 7/19 131 lb 8 oz on 4/19 128 lb on 05/09/22   NUTRITION DIAGNOSIS: Inadequate oral intake improving    INTERVENTION:  Continue eating q 2 hours high calorie, high protein foods to help maintain weight Continue antidiarrheal medications    MONITORING, EVALUATION, GOAL: weight trends, intake   NEXT VISIT: as needed  Rukiya Hodgkins B. Freida Busman, RD, LDN Registered Dietitian [phone]

## 2023-04-10 NOTE — Progress Notes (Signed)
Yes I am usually evaluated [facility] CONSULT NOTE  Patient Care Team: Duanne Limerick, MD as PCP - General (Family Medicine) Lajean Manes, [facility] (Inactive) (Pharmacist) Earna Coder, MD as Consulting Physician (Oncology)  CHIEF COMPLAINTS/PURPOSE OF CONSULTATION: Multiple myeloma  Oncology History Overview Note  # MULTIPLE MYELOMA  [FEB 2023-hypercalcemia status changes]-Active [bone lesions; hypercalcemia; anemia; No renal insuffiencey;Bone marrow-32% plasma cell-   STANDARD Cytogenetics].FEB 2023- S/p dexamethasone 20 mg q day x4.   # REV [15 mg -3 week-On and 1 week-OFF]-DARA; May 2023-discontinued rev 15 diarrhea/hypotension  # MAY 2023- dara-Rev 10 mg 3w/1w   # FEB 2023-hypercalcemia [[facility]-status post calcitonin; bisphosphonate]  BONE MARROW, ASPIRATE, CLOT, CORE:  -Hypercellular bone marrow with plasma cell neoplasm  -See comment   PERIPHERAL BLOOD:  -Normocytic-normochromic anemia   COMMENT:   The bone marrow is hypercellular for age with increased number of  atypical plasma cells representing 32% of all cells in the aspirate  associated with interstitial infiltrates and numerous variably sized  clusters in the clot/biopsy sections.  The plasma cells display weak  kappa light chain restriction consistent with plasma cell neoplasm.  Correlation with cytogenetic and FISH studies is recommended   MICROSCOPIC DESCRIPTION:   PERIPHERAL BLOOD SMEAR: The red blood cells display mild  anisopoikilocytosis with mild polychromasia.  The white blood cells are  normal number with scattered hypogranular neutrophils. An occasional  myelocyte and large atypical mononuclear cells are seen on scan. The  platelets are normal in number.    Multiple myeloma not having achieved remission (HCC)  05/15/2021 Initial Diagnosis   Multiple myeloma not having achieved remission (HCC)   06/27/2021 - 10/17/2021 Chemotherapy   Patient is on Treatment Plan : MYELOMA  Daratumumab SQ + Lenalidomide + Dexamethasone (DaraRd) q28d     06/27/2021 -  Chemotherapy   Patient is on Treatment Plan : MYELOMA RELAPSED REFRACTORY Daratumumab SQ + Lenalidomide + Dexamethasone (DaraRd) q28d     07/11/2021 Cancer Staging   Staging form: Plasma Cell Myeloma and Plasma Cell Disorders, AJCC 8th Edition - Clinical: Beta-2-microglobulin (mg/L): 2.6, Albumin (g/dL): 3.9, ISS: Stage I - Signed by Earna Coder, MD on 07/11/2021 Stage prefix: Initial diagnosis Beta 2 microglobulin range (mg/L): Less than 3.5 Albumin range (g/dL): Greater than or equal to 3.5    HISTORY OF PRESENTING ILLNESS: pt in a wheel chair; accompanied by her daughter.   Cristina Morgan 89 y.o.  female with  multiple myeloma currently on Rev-Dex is proceed with Dara SQ today.    Appetite 50% normal, glucerna occ, states she has a bitter taste. Did have some n/v last week.   Patient notes to have diarrhea have resolved. No nausea. Bowels normal.. Denies any nausea vomiting.  Denies any blood in stools or black-colored stools.  Denies any worsening joint pains.   Review of Systems  Constitutional:  Positive for malaise/fatigue and weight loss. Negative for chills, diaphoresis and fever.  HENT:  Negative for nosebleeds and sore throat.   Eyes:  Negative for double vision.  Respiratory:  Negative for cough, hemoptysis, sputum production, shortness of breath and wheezing.   Cardiovascular:  Negative for chest pain, palpitations, orthopnea and leg swelling.  Gastrointestinal:  Negative for abdominal pain, blood in stool, constipation, diarrhea, heartburn, melena, nausea and vomiting.  Genitourinary:  Negative for dysuria, frequency and urgency.  Musculoskeletal:  Positive for back pain and joint pain.  Skin: Negative.  Negative for itching and rash.  Neurological:  Negative for  dizziness, tingling, focal weakness, weakness and headaches.  Endo/Heme/Allergies:  Does not bruise/bleed easily.   Psychiatric/Behavioral:  Negative for depression. The patient is not nervous/anxious and does not have insomnia.     MEDICAL HISTORY:  Past Medical History:  Diagnosis Date   Anemia    CAD (coronary artery disease)    Cataracts, bilateral    Closed compression fracture of first lumbar vertebra (HCC)    Closed compression fracture of second lumbar vertebra (HCC)    Diabetes mellitus without complication (HCC)    High cholesterol    History of pneumonia 2010   [place]   History of uterine fibroid    Hypercalcemia    Hyperlipidemia    Hypertension    Lytic bone lesions on xray    Multiple myeloma (HCC)    Osteoporosis    Primary osteoarthritis of right knee     SURGICAL HISTORY: Past Surgical History:  Procedure Laterality Date   CATARACT EXTRACTION Bilateral 2014   COLONOSCOPY  2011   cleared for 5 yrs- [facility]   ECTOPIC PREGNANCY SURGERY      SOCIAL HISTORY: Social History   Socioeconomic History   Marital status: Married    Spouse name: Not on file   Number of children: 2   Years of education: Not on file   Highest education level: 12th grade  Occupational History   Occupation: Retired  Tobacco Use   Smoking status: Never    Passive exposure: Never   Smokeless tobacco: Never   Tobacco comments:    smoking cessation materials not required  Vaping Use   Vaping status: Never Used  Substance and Sexual Activity   Alcohol use: No   Drug use: No   Sexual activity: Not Currently  Other Topics Concern   Not on file  Social History Narrative   [place] with husband; drives. Never smoked; no alcohol. Daughters live close.    Social Drivers of Corporate investment banker Strain: Low Risk  (11/19/2022)   Overall Financial Resource Strain (CARDIA)    Difficulty of Paying Living Expenses: Not very hard  Food Insecurity: No Food Insecurity (11/19/2022)   Hunger Vital Sign    Worried About Running Out of Food in the Last Year: Never true    Ran Out of Food  in the Last Year: Never true  Transportation Needs: No Transportation Needs (11/19/2022)   PRAPARE - Administrator, Civil Service (Medical): No    Lack of Transportation (Non-Medical): No  Physical Activity: Sufficiently Active (11/19/2022)   Exercise Vital Sign    Days of Exercise per Week: 5 days    Minutes of Exercise per Session: 30 min  Stress: No Stress Concern Present (11/19/2022)   [employer] of Occupational Health - Occupational Stress Questionnaire    Feeling of Stress : Not at all  Social Connections: Moderately Integrated (11/19/2022)   Social Connection and Isolation Panel [NHANES]    Frequency of Communication with Friends and Family: Twice a week    Frequency of Social Gatherings with Friends and Family: More than three times a week    Attends Religious Services: More than 4 times per year    Active Member of [employer] or Organizations: No    Attends Banker Meetings: Never    Marital Status: Married  Catering manager Violence: Not At Risk (11/19/2022)   Humiliation, Afraid, Rape, and Kick questionnaire    Fear of Current or Ex-Partner: No    Emotionally Abused: No  Physically Abused: No    Sexually Abused: No    FAMILY HISTORY: Family History  Problem Relation Age of Onset   Cancer Mother        breast   Heart disease Mother    Diabetes Father    Heart disease Father    Heart attack Father    Heart attack Brother    Diabetes Brother     ALLERGIES:  is allergic to latex and penicillins.  MEDICATIONS:  Current Outpatient Medications  Medication Sig Dispense Refill   Accu-Chek Softclix Lancets lancets Use as instructed 100 each 0   acetaminophen (TYLENOL) 325 MG tablet Take 325 mg by mouth 2 (two) times daily. START 2 days prior to chemo infusion. Take For 2 days; Do NOT take on the day of infusion     acyclovir (ZOVIRAX) 400 MG tablet TAKE 1 TABLET BY MOUTH TWICE DAILY 60 tablet 10   aspirin EC 81 MG tablet Take 81 mg by mouth daily.  Swallow whole.     BD PEN NEEDLE NANO 2ND GEN 32G X 4 MM MISC USE 1  ONCE DAILY 100 each 0   Blood Glucose Monitoring Suppl (ACCU-CHEK GUIDE) w/Device KIT USE AS DIRECTED TO CHECK GLUCOSE     Calcium Carbonate-Vit D-Min (CALCIUM 1200 PO) Take 1 capsule by mouth daily.     carvedilol (COREG) 3.125 MG tablet TAKE 1 TABLET BY MOUTH TWICE DAILY 60 tablet 10   cholecalciferol (VITAMIN D3) 25 MCG (1000 UNIT) tablet Take 1,000 Units by mouth daily.     dexamethasone (DECADRON) 4 MG tablet Take 5 tablets (20 mg total) by mouth as directed. Take one hour before monthly Darzalex injections. 15 tablet 3   diphenoxylate-atropine (LOMOTIL) 2.5-0.025 MG tablet TAKE 1 TABLET BY MOUTH FOUR TIMES DAILY AS NEEDED FOR DIARRHEA OR LOOSE STOOLS *TAKE WITH IMMODIUM* 60 tablet 2   furosemide (LASIX) 20 MG tablet TAKE 1 TABLET BY MOUTH ONCE DAILY IN THE MORNING 30 tablet 0   glucose blood (ACCU-CHEK GUIDE) test strip Use as instructed 100 each 0   hydrOXYzine (ATARAX) 10 MG tablet Take 1 tablet (10 mg total) by mouth at bedtime as needed. 90 tablet 1   insulin glargine (LANTUS SOLOSTAR) 100 UNIT/ML Solostar Pen Inject 10 Units into the skin daily. 6 units every day except Wed and Thursday- 8 units 15 mL 0   lenalidomide (REVLIMID) 10 MG capsule Take 1 capsule (10 mg total) by mouth daily. Take for 21 days, then hold for 7 days. Repeat every 28 days. 21 capsule 0   lovastatin (MEVACOR) 40 MG tablet TAKE ONE TABLET BY MOUTH EVERY evening 90 tablet 3   metFORMIN (GLUCOPHAGE) 1000 MG tablet TAKE 1 TABLET BY MOUTH TWICE DAILY 60 tablet 10   montelukast (SINGULAIR) 10 MG tablet STARTING 2 DAYS BEFORE INFUSION, TAKE 1 TABLET BY MOUTH ONCE DAILY *DO NOT TAKE ON DAY OF INFUSION* 20 tablet 10   ondansetron (ZOFRAN) 8 MG tablet One pill every 8 hours as needed for nausea/vomitting. 40 tablet 1   pantoprazole (PROTONIX) 40 MG tablet Take 1 tablet (40 mg total) by mouth daily. 90 tablet 1   potassium chloride SA (KLOR-CON M) 20 MEQ  tablet 1 pill twice a day 60 tablet 3   prochlorperazine (COMPAZINE) 10 MG tablet Take 1 tablet (10 mg total) by mouth every 6 (six) hours as needed for nausea or vomiting. 40 tablet 1   sertraline (ZOLOFT) 25 MG tablet TAKE ONE TABLET BY MOUTH ONCE DAILY 90  tablet 1   triamcinolone (NASACORT) 55 MCG/ACT AERO nasal inhaler 2 sprays 2 (two) times daily.     Glucerna (GLUCERNA) LIQD Take 237 mLs by mouth. (Patient not taking: Reported on 04/10/2023)     losartan (COZAAR) 25 MG tablet TAKE ONE TABLET BY MOUTH EVERY EVENING 90 tablet 0   losartan (COZAAR) 50 MG tablet Take 1 tablet (50 mg total) by mouth every evening. 90 tablet 0   No current facility-administered medications for this visit.   Facility-Administered Medications Ordered in Other Visits  Medication Dose Route Frequency Provider Last Rate Last Admin   daratumumab-hyaluronidase-fihj (DARZALEX FASPRO) 1800-30000 MG-UT/15ML chemo SQ injection 1,800 mg  1,800 mg Subcutaneous Once Michaelyn Barter, MD        PHYSICAL EXAMINATION:  Vitals:   04/10/23 1008  BP: (!) 140/49  Pulse: 66  Temp: 97.9 F (36.6 C)       Filed Weights   04/10/23 1008  Weight: 128 lb 8 oz (58.3 kg)        Physical Exam Vitals and nursing note reviewed.  HENT:     Head: Normocephalic and atraumatic.     Mouth/Throat:     Pharynx: Oropharynx is clear.  Eyes:     Extraocular Movements: Extraocular movements intact.     Pupils: Pupils are equal, round, and reactive to light.  Cardiovascular:     Rate and Rhythm: Normal rate and regular rhythm.  Pulmonary:     Comments: Decreased breath sounds bilaterally.  Abdominal:     Palpations: Abdomen is soft.  Musculoskeletal:        General: Normal range of motion.     Cervical back: Normal range of motion.  Skin:    General: Skin is warm.  Neurological:     General: No focal deficit present.     Mental Status: She is alert and oriented to person, place, and time.  Psychiatric:        Behavior:  Behavior normal.        Judgment: Judgment normal.      LABORATORY DATA:  I have reviewed the data as listed Lab Results  Component Value Date   WBC 4.5 04/10/2023   HGB 11.1 (L) 04/10/2023   HCT 34.2 (L) 04/10/2023   MCV 106.9 (H) 04/10/2023   PLT 237 04/10/2023   Recent Labs    01/23/23 0920 02/20/23 0854 04/10/23 0955  NA 135 138 135  K 3.7 2.6* 4.1  CL 101 104 102  CO2 25 24 25   GLUCOSE 134* 160* 179*  BUN 18 12 15   CREATININE 1.01* 0.73 0.97  CALCIUM 8.7* 8.9 9.7  GFRNONAA 54* >60 57*  PROT 6.2* 6.4* 6.7  ALBUMIN 3.7 3.9 4.4  AST 17 19 21   ALT 14 17 15   ALKPHOS 50 46 51  BILITOT 0.8 0.8 0.6    RADIOGRAPHIC STUDIES: I have personally reviewed the radiological images as listed and agreed with the findings in the report. No results found.  Multiple myeloma not having achieved remission (HCC) # STAGE I- MULTIPLE MYELOMA -Active [bone lesions; hypercalcemia; anemia; No renal insuffiencey;Bone marrow-32% plasma cell-   STANDARD Cytogenetics].  Currently on Revlimid-Dex- Dara SQ. Partial response noted;  JAN 2024-M protein = NEG;  K/L RATIO= 1. 6= WNL- stable.   # Proceed with Dara SQ today + DEX 20 mg PO [at home/pre-meds] Labs today reviewed;  ALSO  continue Revlimid at 10 mg dose 3 weeks on 1 week off-  until further directions.   #  Hypokalemia severe-DEC K.2.6 - improved today 4.1-ok to take Kdur once a day.     #Reflux-like symptoms-  Secondary to dexamethasone; on PPI- continue low-dose 4 mg weekly/WED.  and wants to hold off GI evaluation [per daughter]- improved. Stable.   # Anemia-moderate- multifactorial-MM/ IDA-  continue trial of gentle iron. Hb [JAn 2024]- 10111  JUNE 2024- iron sat-13; stable.  # Mild swelling in leg/ likely dependant/compression- no concerns of DVT- stable. on lasix 20 mg/day;   # Diabetes-on oral hypoglycemic agents; on long acting insulin; 14 units of lantus on T/W/Thursday- Defer to PCP. PBF- BG-237  stable.    # labile BP-  recent hypotension- but in general HTN: continue losartan; continue coreg; BP- 150-160- refilled coreg; monitor for now. stable.    # Hypercalcemia secondary to multiple myeloma- [FEB 2023-Zometa ; calcitonin]-calcium today is 9.6. Zometa q 72M- stable.   # Weight loss-s/p re-veluation with Joli -. stable.   #DVT/shingles prophylaxis: Aspirin/acyclovirstable.  # Vaccinations; Ok with flu/COVID shot  # IV access:PIV   [facility]- [phone]  zometa-q 3 months [last  OCT 2024-]- ; MM; K/L q 4 W  # DISPOSITION:  # Dara SQ; HOLD venofer- # follow up in 4  weeks/Friday  MD-labs- cbc/cmp;BNP; iron studies; ferritin-; MM panel;K/L light chains; Dara SQ; Zometa-Dr.B    All questions were answered. The patient knows to call the clinic with any problems, questions or concerns.    Earna Coder, MD 04/10/2023 11:19 AM

## 2023-04-10 NOTE — Progress Notes (Signed)
Appetite 50% normal, glucerna occ, states she has a bitter taste. Did have some n/v last week.  C/o occasional dizziness, when bending over.

## 2023-04-13 LAB — KAPPA/LAMBDA LIGHT CHAINS
Kappa free light chain: 14.2 mg/L (ref 3.3–19.4)
Kappa, lambda light chain ratio: 1.41 (ref 0.26–1.65)
Lambda free light chains: 10.1 mg/L (ref 5.7–26.3)

## 2023-04-14 LAB — MULTIPLE MYELOMA PANEL, SERUM
Albumin SerPl Elph-Mcnc: 3.8 g/dL (ref 2.9–4.4)
Albumin/Glob SerPl: 1.8 — ABNORMAL HIGH (ref 0.7–1.7)
Alpha 1: 0.2 g/dL (ref 0.0–0.4)
Alpha2 Glob SerPl Elph-Mcnc: 0.6 g/dL (ref 0.4–1.0)
B-Globulin SerPl Elph-Mcnc: 0.9 g/dL (ref 0.7–1.3)
Gamma Glob SerPl Elph-Mcnc: 0.5 g/dL (ref 0.4–1.8)
Globulin, Total: 2.2 g/dL (ref 2.2–3.9)
IgA: 61 mg/dL — ABNORMAL LOW (ref 64–422)
IgG (Immunoglobin G), Serum: 561 mg/dL — ABNORMAL LOW (ref 586–1602)
IgM (Immunoglobulin M), Srm: 20 mg/dL — ABNORMAL LOW (ref 26–217)
Total Protein ELP: 6 g/dL (ref 6.0–8.5)

## 2023-04-17 ENCOUNTER — Telehealth: Payer: Self-pay | Admitting: *Deleted

## 2023-04-17 ENCOUNTER — Other Ambulatory Visit: Payer: Self-pay | Admitting: *Deleted

## 2023-04-17 ENCOUNTER — Encounter: Payer: Self-pay | Admitting: Internal Medicine

## 2023-04-17 DIAGNOSIS — F5102 Adjustment insomnia: Secondary | ICD-10-CM

## 2023-04-17 DIAGNOSIS — F329 Major depressive disorder, single episode, unspecified: Secondary | ICD-10-CM

## 2023-04-17 MED ORDER — DIPHENOXYLATE-ATROPINE 2.5-0.025 MG PO TABS: 1.0000 | ORAL_TABLET | Freq: Four times a day (QID) | ORAL | 2 refills | Status: DC | PRN

## 2023-04-17 MED ORDER — SERTRALINE HCL 25 MG PO TABS: 25.0000 mg | ORAL_TABLET | Freq: Every day | ORAL | 1 refills | Status: DC

## 2023-04-17 NOTE — Telephone Encounter (Signed)
I put in the refill of zoloft and Lomotil and sent it to have Brahmanday to aprove it and sign it to [facility] to [facility]

## 2023-04-23 ENCOUNTER — Other Ambulatory Visit: Payer: Self-pay | Admitting: Internal Medicine

## 2023-04-23 DIAGNOSIS — R600 Localized edema: Secondary | ICD-10-CM

## 2023-04-24 ENCOUNTER — Other Ambulatory Visit: Payer: Self-pay | Admitting: *Deleted

## 2023-04-24 ENCOUNTER — Telehealth: Payer: Self-pay | Admitting: *Deleted

## 2023-04-24 DIAGNOSIS — C9000 Multiple myeloma not having achieved remission: Secondary | ICD-10-CM

## 2023-04-24 NOTE — Telephone Encounter (Signed)
 I got a telephone from that pharmacy for Ms. Cristina Morgan and it was about the dexamethasone  and then I went and asked Dr. Rennie does she need a refill and he said yes, but when I went to put the [facility] in [facility] [place]  I called the patient to see which pharmacy she wanted me to send it to but she did not answer and did not have a way to leave a message.  I will check up on it on Monday

## 2023-04-28 ENCOUNTER — Encounter: Payer: Self-pay | Admitting: Internal Medicine

## 2023-05-02 ENCOUNTER — Other Ambulatory Visit: Payer: Self-pay | Admitting: Internal Medicine

## 2023-05-06 ENCOUNTER — Encounter: Payer: Self-pay | Admitting: Internal Medicine

## 2023-05-08 ENCOUNTER — Inpatient Hospital Stay: Payer: Medicare Other

## 2023-05-08 ENCOUNTER — Encounter: Payer: Self-pay | Admitting: Internal Medicine

## 2023-05-08 ENCOUNTER — Inpatient Hospital Stay: Payer: Medicare Other | Attending: Nurse Practitioner

## 2023-05-08 ENCOUNTER — Inpatient Hospital Stay (HOSPITAL_BASED_OUTPATIENT_CLINIC_OR_DEPARTMENT_OTHER): Payer: Medicare Other | Admitting: Internal Medicine

## 2023-05-08 ENCOUNTER — Other Ambulatory Visit: Payer: Self-pay

## 2023-05-08 VITALS — BP 149/63 | HR 74 | Temp 97.9°F | Ht 66.0 in | Wt 127.1 lb

## 2023-05-08 VITALS — BP 145/71 | HR 94 | Temp 95.2°F | Resp 19

## 2023-05-08 DIAGNOSIS — E876 Hypokalemia: Secondary | ICD-10-CM | POA: Diagnosis not present

## 2023-05-08 DIAGNOSIS — C9000 Multiple myeloma not having achieved remission: Secondary | ICD-10-CM | POA: Diagnosis not present

## 2023-05-08 DIAGNOSIS — I10 Essential (primary) hypertension: Secondary | ICD-10-CM | POA: Insufficient documentation

## 2023-05-08 DIAGNOSIS — E119 Type 2 diabetes mellitus without complications: Secondary | ICD-10-CM

## 2023-05-08 DIAGNOSIS — R600 Localized edema: Secondary | ICD-10-CM | POA: Insufficient documentation

## 2023-05-08 DIAGNOSIS — D509 Iron deficiency anemia, unspecified: Secondary | ICD-10-CM | POA: Diagnosis not present

## 2023-05-08 DIAGNOSIS — R601 Generalized edema: Secondary | ICD-10-CM

## 2023-05-08 DIAGNOSIS — Z5112 Encounter for antineoplastic immunotherapy: Secondary | ICD-10-CM | POA: Insufficient documentation

## 2023-05-08 LAB — CBC WITH DIFFERENTIAL/PLATELET
Abs Immature Granulocytes: 0.01 10*3/uL (ref 0.00–0.07)
Basophils Absolute: 0 10*3/uL (ref 0.0–0.1)
Basophils Relative: 1 %
Eosinophils Absolute: 0 10*3/uL (ref 0.0–0.5)
Eosinophils Relative: 1 %
HCT: 32.1 % — ABNORMAL LOW (ref 36.0–46.0)
Hemoglobin: 10.4 g/dL — ABNORMAL LOW (ref 12.0–15.0)
Immature Granulocytes: 0 %
Lymphocytes Relative: 27 %
Lymphs Abs: 1.4 10*3/uL (ref 0.7–4.0)
MCH: 34.9 pg — ABNORMAL HIGH (ref 26.0–34.0)
MCHC: 32.4 g/dL (ref 30.0–36.0)
MCV: 107.7 fL — ABNORMAL HIGH (ref 80.0–100.0)
Monocytes Absolute: 0.3 10*3/uL (ref 0.1–1.0)
Monocytes Relative: 6 %
Neutro Abs: 3.4 10*3/uL (ref 1.7–7.7)
Neutrophils Relative %: 65 %
Platelets: 208 10*3/uL (ref 150–400)
RBC: 2.98 MIL/uL — ABNORMAL LOW (ref 3.87–5.11)
RDW: 14 % (ref 11.5–15.5)
WBC: 5.2 10*3/uL (ref 4.0–10.5)
nRBC: 0 % (ref 0.0–0.2)

## 2023-05-08 LAB — IRON AND TIBC
Iron: 45 ug/dL (ref 28–170)
Saturation Ratios: 15 % (ref 10.4–31.8)
TIBC: 300 ug/dL (ref 250–450)
UIBC: 255 ug/dL

## 2023-05-08 LAB — CMP (CANCER CENTER ONLY)
ALT: 13 U/L (ref 0–44)
AST: 20 U/L (ref 15–41)
Albumin: 4 g/dL (ref 3.5–5.0)
Alkaline Phosphatase: 62 U/L (ref 38–126)
Anion gap: 9 (ref 5–15)
BUN: 16 mg/dL (ref 8–23)
CO2: 22 mmol/L (ref 22–32)
Calcium: 9.4 mg/dL (ref 8.9–10.3)
Chloride: 105 mmol/L (ref 98–111)
Creatinine: 0.8 mg/dL (ref 0.44–1.00)
GFR, Estimated: >60 mL/min (ref >60)
Glucose, Bld: 207 mg/dL — ABNORMAL HIGH (ref 70–99)
Potassium: 4.2 mmol/L (ref 3.5–5.1)
Sodium: 136 mmol/L (ref 135–145)
Total Bilirubin: 0.7 mg/dL (ref 0.0–1.2)
Total Protein: 6.1 g/dL — ABNORMAL LOW (ref 6.5–8.1)

## 2023-05-08 LAB — BRAIN NATRIURETIC PEPTIDE: B Natriuretic Peptide: 130.7 pg/mL — ABNORMAL HIGH (ref 0.0–100.0)

## 2023-05-08 LAB — FERRITIN: Ferritin: 343 ng/mL — ABNORMAL HIGH (ref 11–307)

## 2023-05-08 MED ORDER — LENALIDOMIDE 10 MG PO CAPS
10.0000 mg | ORAL_CAPSULE | Freq: Every day | ORAL | 0 refills | Status: DC
Start: 2023-05-08 — End: 2023-06-02

## 2023-05-08 MED ORDER — ZOLEDRONIC ACID 4 MG/5ML IV CONC
3.3000 mg | Freq: Once | INTRAVENOUS | Status: AC
  Administered 2023-05-08: 3.3 mg via INTRAVENOUS
  Filled 2023-05-08: qty 4.13

## 2023-05-08 MED ORDER — FUROSEMIDE 20 MG PO TABS
20.0000 mg | ORAL_TABLET | Freq: Every morning | ORAL | 1 refills | Status: DC
Start: 2023-05-08 — End: 2023-08-17

## 2023-05-08 MED ORDER — DARATUMUMAB-HYALURONIDASE-FIHJ 1800-30000 MG-UT/15ML ~~LOC~~ SOLN
1800.0000 mg | Freq: Once | SUBCUTANEOUS | Status: AC
  Administered 2023-05-08: 1800 mg via SUBCUTANEOUS
  Filled 2023-05-08: qty 15

## 2023-05-08 MED ORDER — LOSARTAN POTASSIUM 50 MG PO TABS: 50.0000 mg | ORAL_TABLET | Freq: Every evening | ORAL | Status: DC

## 2023-05-08 MED ORDER — ACETAMINOPHEN 325 MG PO TABS
650.0000 mg | ORAL_TABLET | Freq: Once | ORAL | Status: AC
  Administered 2023-05-08: 650 mg via ORAL
  Filled 2023-05-08: qty 2

## 2023-05-08 MED ORDER — SODIUM CHLORIDE 0.9 % IV SOLN
Freq: Once | INTRAVENOUS | Status: AC
Start: 2023-05-08 — End: 2023-05-08
  Filled 2023-05-08: qty 250

## 2023-05-08 MED ORDER — DIPHENHYDRAMINE HCL 25 MG PO CAPS
50.0000 mg | ORAL_CAPSULE | Freq: Once | ORAL | Status: DC
  Filled 2023-05-08: qty 2

## 2023-05-08 NOTE — Assessment & Plan Note (Signed)
#   STAGE I- MULTIPLE MYELOMA -Active [bone lesions; hypercalcemia; anemia; No renal insuffiencey;Bone marrow-32% plasma cell-   STANDARD Cytogenetics].  Currently on Revlimid-Dex- Dara SQ. Partial response noted;  JAN 2024-M protein = NEG;  K/L RATIO= 1. 6= WNL- stable.   # Proceed with Dara SQ today + DEX 20 mg PO [at home/pre-meds] Labs today reviewed;  ALSO  RE-START Revlimid at 10 mg dose 3 weeks on 1 week off- if worse diarrhea- will discontinue revlimid.   # Hypokalemia severe-DEC K.2.6 - improved today 4.1-ok to take Kdur once a day- stable.      #Reflux-like symptoms-  Secondary to dexamethasone; on PPI- continue low-dose 4 mg weekly/WED.  and wants to hold off GI evaluation [per daughter]- improved. Stable.   # Anemia-moderate- multifactorial-MM/ IDA-  continue trial of gentle iron. Hb [JAn 2024]- 10-11- JUNE 2024- iron sat-13; stable.  # Mild swelling in leg/ likely dependant/compression- no concerns of DVT- stable. on lasix 20 mg/day- stable.    # Diabetes-on oral hypoglycemic agents; on long acting insulin; 14 units of lantus on T/W/Thursday- Defer to PCP. PBF- BG-204- stable.   # labile BP- recent hypotension- but in general HTN: continue losartan; continue coreg; BP- 150-160- refilled coreg; monitor for now. stable.    # Hypercalcemia secondary to multiple myeloma- [FEB 2023-Zometa ; calcitonin]-calcium today is 9.6. Zometa q 55M- stable.   # Weight loss-s/p re-veluation with Joli -stable.   #DVT/shingles prophylaxis: Aspirin/acyclovirr- stable.   # Vaccinations; Ok with flu/COVID shot  # IV access:PIV   [facility]- [phone]  zometa-q 3 months [l feb 20th, 2025]- ; MM; K/L q 4 W  # DISPOSITION:  # Dara SQ; Zometa  # follow up in 4  weeks/Friday  MD-labs- cbc/cmp;BNP; MM panel;K/L light chains; Dara SQ;possible venofer-Dr.B

## 2023-05-08 NOTE — Progress Notes (Signed)
Bp 207/98 post zometa and darzalex. Donneta Romberg, MD notified. Ordered to recheck bp after 15 minutes and keep for observation. BP after waiting 20 minutes was 145/71. Donneta Romberg, MD notified and ok to discharge. Pt educated to monitor bp and seek emergency care if she becomes symptomatic of hypertension. Pt stable at discharge.

## 2023-05-08 NOTE — Patient Instructions (Signed)
 [facility] - A DEPT OF [facility]  Discharge Instructions: Thank you for choosing [facility] to provide your oncology and hematology care.  If you have a lab appointment with the Cancer Center, please go directly to the Cancer Center and check in at the registration area.  Wear comfortable clothing and clothing appropriate for easy access to any Portacath or PICC line.   We strive to give you quality time with your provider. You may need to reschedule your appointment if you arrive late (15 or more minutes).  Arriving late affects you and other patients whose appointments are after yours.  Also, if you miss three or more appointments without notifying the office, you may be dismissed from the clinic at the provider's discretion.      For prescription refill requests, have your pharmacy contact our office and allow 72 hours for refills to be completed.    Today you received the following chemotherapy and/or immunotherapy agents darzalex    To help prevent nausea and vomiting after your treatment, we encourage you to take your nausea medication as directed.  BELOW ARE SYMPTOMS THAT SHOULD BE REPORTED IMMEDIATELY: *FEVER GREATER THAN 100.4 F (38 C) OR HIGHER *CHILLS OR SWEATING *NAUSEA AND VOMITING THAT IS NOT CONTROLLED WITH YOUR NAUSEA MEDICATION *UNUSUAL SHORTNESS OF BREATH *UNUSUAL BRUISING OR BLEEDING *URINARY PROBLEMS (pain or burning when urinating, or frequent urination) *BOWEL PROBLEMS (unusual diarrhea, constipation, pain near the anus) TENDERNESS IN MOUTH AND THROAT WITH OR WITHOUT PRESENCE OF ULCERS (sore throat, sores in mouth, or a toothache) UNUSUAL RASH, SWELLING OR PAIN  UNUSUAL VAGINAL DISCHARGE OR ITCHING   Items with * indicate a potential emergency and should be followed up as soon as possible or go to the Emergency Department if any problems should occur.  Please show the CHEMOTHERAPY ALERT CARD or IMMUNOTHERAPY ALERT  CARD at check-in to the Emergency Department and triage nurse.  Should you have questions after your visit or need to cancel or reschedule your appointment, please contact [facility] - A DEPT OF [facility]  [phone] and follow the prompts.  Office hours are 8:00 a.m. to 4:30 p.m. Monday - Friday. Please note that voicemails left after 4:00 p.m. may not be returned until the following business day.  We are closed weekends and major holidays. You have access to a nurse at all times for urgent questions. Please call the main number to the clinic [phone] and follow the prompts.  For any non-urgent questions, you may also contact your provider using MyChart. We now offer e-Visits for anyone 100 and older to request care online for non-urgent symptoms. For details visit [email or web address].   Also download the MyChart app! Go to the app store, search "MyChart", open the app, select , and log in with your MyChart username and password.

## 2023-05-08 NOTE — Progress Notes (Signed)
Refill lasix and losartan pended.

## 2023-05-08 NOTE — Progress Notes (Signed)
Yes I am usually evaluated [facility] CONSULT NOTE  Patient Care Team: Duanne Limerick, MD as PCP - General (Family Medicine) Lajean Manes, [facility] (Inactive) (Pharmacist) Earna Coder, MD as Consulting Physician (Oncology)  CHIEF COMPLAINTS/PURPOSE OF CONSULTATION: Multiple myeloma  Oncology History Overview Note  # MULTIPLE MYELOMA  [FEB 2023-hypercalcemia status changes]-Active [bone lesions; hypercalcemia; anemia; No renal insuffiencey;Bone marrow-32% plasma cell-   STANDARD Cytogenetics].FEB 2023- S/p dexamethasone 20 mg q day x4.   # REV [15 mg -3 week-On and 1 week-OFF]-DARA; May 2023-discontinued rev 15 diarrhea/hypotension  # MAY 2023- dara-Rev 10 mg 3w/1w   # FEB 2023-hypercalcemia [[facility]-status post calcitonin; bisphosphonate]  BONE MARROW, ASPIRATE, CLOT, CORE:  -Hypercellular bone marrow with plasma cell neoplasm  -See comment   PERIPHERAL BLOOD:  -Normocytic-normochromic anemia   COMMENT:   The bone marrow is hypercellular for age with increased number of  atypical plasma cells representing 32% of all cells in the aspirate  associated with interstitial infiltrates and numerous variably sized  clusters in the clot/biopsy sections.  The plasma cells display weak  kappa light chain restriction consistent with plasma cell neoplasm.  Correlation with cytogenetic and FISH studies is recommended   MICROSCOPIC DESCRIPTION:   PERIPHERAL BLOOD SMEAR: The red blood cells display mild  anisopoikilocytosis with mild polychromasia.  The white blood cells are  normal number with scattered hypogranular neutrophils. An occasional  myelocyte and large atypical mononuclear cells are seen on scan. The  platelets are normal in number.    Multiple myeloma not having achieved remission (HCC)  05/15/2021 Initial Diagnosis   Multiple myeloma not having achieved remission (HCC)   06/27/2021 - 10/17/2021 Chemotherapy   Patient is on Treatment Plan : MYELOMA  Daratumumab SQ + Lenalidomide + Dexamethasone (DaraRd) q28d     06/27/2021 -  Chemotherapy   Patient is on Treatment Plan : MYELOMA RELAPSED REFRACTORY Daratumumab SQ + Lenalidomide + Dexamethasone (DaraRd) q28d     07/11/2021 Cancer Staging   Staging form: Plasma Cell Myeloma and Plasma Cell Disorders, AJCC 8th Edition - Clinical: Beta-2-microglobulin (mg/L): 2.6, Albumin (g/dL): 3.9, ISS: Stage I - Signed by Earna Coder, MD on 07/11/2021 Stage prefix: Initial diagnosis Beta 2 microglobulin range (mg/L): Less than 3.5 Albumin range (g/dL): Greater than or equal to 3.5    HISTORY OF PRESENTING ILLNESS: pt in a wheel chair; accompanied by her daughter.   Cristina Morgan 89 y.o.  female with  multiple myeloma currently on Dara-Rev-Dex is proceed with Dara SQ today.    Patient is currently off Revlimid because of diarrhea.  Appetite is good.  No weight loss.  No nausea no vomiting.  Patient notes to have diarrhea have resolved. No nausea. Bowels normal.. Denies any nausea vomiting.  Denies any blood in stools or black-colored stools.  Denies any worsening joint pains.   Review of Systems  Constitutional:  Positive for malaise/fatigue and weight loss. Negative for chills, diaphoresis and fever.  HENT:  Negative for nosebleeds and sore throat.   Eyes:  Negative for double vision.  Respiratory:  Negative for cough, hemoptysis, sputum production, shortness of breath and wheezing.   Cardiovascular:  Negative for chest pain, palpitations, orthopnea and leg swelling.  Gastrointestinal:  Negative for abdominal pain, blood in stool, constipation, diarrhea, heartburn, melena, nausea and vomiting.  Genitourinary:  Negative for dysuria, frequency and urgency.  Musculoskeletal:  Positive for back pain and joint pain.  Skin: Negative.  Negative for itching and rash.  Neurological:  Negative for dizziness, tingling, focal weakness, weakness and headaches.  Endo/Heme/Allergies:  Does not  bruise/bleed easily.  Psychiatric/Behavioral:  Negative for depression. The patient is not nervous/anxious and does not have insomnia.     MEDICAL HISTORY:  Past Medical History:  Diagnosis Date   Anemia    CAD (coronary artery disease)    Cataracts, bilateral    Closed compression fracture of first lumbar vertebra (HCC)    Closed compression fracture of second lumbar vertebra (HCC)    Diabetes mellitus without complication (HCC)    High cholesterol    History of pneumonia 2010   [place]   History of uterine fibroid    Hypercalcemia    Hyperlipidemia    Hypertension    Lytic bone lesions on xray    Multiple myeloma (HCC)    Osteoporosis    Primary osteoarthritis of right knee     SURGICAL HISTORY: Past Surgical History:  Procedure Laterality Date   CATARACT EXTRACTION Bilateral 2014   COLONOSCOPY  2011   cleared for 5 yrs- [facility]   ECTOPIC PREGNANCY SURGERY      SOCIAL HISTORY: Social History   Socioeconomic History   Marital status: Married    Spouse name: Not on file   Number of children: 2   Years of education: Not on file   Highest education level: 12th grade  Occupational History   Occupation: Retired  Tobacco Use   Smoking status: Never    Passive exposure: Never   Smokeless tobacco: Never   Tobacco comments:    smoking cessation materials not required  Vaping Use   Vaping status: Never Used  Substance and Sexual Activity   Alcohol use: No   Drug use: No   Sexual activity: Not Currently  Other Topics Concern   Not on file  Social History Narrative   [place] with husband; drives. Never smoked; no alcohol. Daughters live close.    Social Drivers of Corporate investment banker Strain: Low Risk  (11/19/2022)   Overall Financial Resource Strain (CARDIA)    Difficulty of Paying Living Expenses: Not very hard  Food Insecurity: No Food Insecurity (11/19/2022)   Hunger Vital Sign    Worried About Running Out of Food in the Last Year: Never true     Ran Out of Food in the Last Year: Never true  Transportation Needs: No Transportation Needs (11/19/2022)   PRAPARE - Administrator, Civil Service (Medical): No    Lack of Transportation (Non-Medical): No  Physical Activity: Sufficiently Active (11/19/2022)   Exercise Vital Sign    Days of Exercise per Week: 5 days    Minutes of Exercise per Session: 30 min  Stress: No Stress Concern Present (11/19/2022)   [employer] of Occupational Health - Occupational Stress Questionnaire    Feeling of Stress : Not at all  Social Connections: Moderately Integrated (11/19/2022)   Social Connection and Isolation Panel [NHANES]    Frequency of Communication with Friends and Family: Twice a week    Frequency of Social Gatherings with Friends and Family: More than three times a week    Attends Religious Services: More than 4 times per year    Active Member of [employer] or Organizations: No    Attends Banker Meetings: Never    Marital Status: Married  Catering manager Violence: Not At Risk (11/19/2022)   Humiliation, Afraid, Rape, and Kick questionnaire    Fear of Current or Ex-Partner: No    Emotionally  Abused: No    Physically Abused: No    Sexually Abused: No    FAMILY HISTORY: Family History  Problem Relation Age of Onset   Cancer Mother        breast   Heart disease Mother    Diabetes Father    Heart disease Father    Heart attack Father    Heart attack Brother    Diabetes Brother     ALLERGIES:  is allergic to latex and penicillins.  MEDICATIONS:  Current Outpatient Medications  Medication Sig Dispense Refill   Accu-Chek Softclix Lancets lancets Use as instructed 100 each 0   acetaminophen (TYLENOL) 325 MG tablet Take 325 mg by mouth 2 (two) times daily. START 2 days prior to chemo infusion. Take For 2 days; Do NOT take on the day of infusion     acyclovir (ZOVIRAX) 400 MG tablet TAKE 1 TABLET BY MOUTH TWICE DAILY 60 tablet 10   aspirin EC 81 MG tablet Take 81  mg by mouth daily. Swallow whole.     BD PEN NEEDLE NANO 2ND GEN 32G X 4 MM MISC USE 1  ONCE DAILY 100 each 0   Blood Glucose Monitoring Suppl (ACCU-CHEK GUIDE) w/Device KIT USE AS DIRECTED TO CHECK GLUCOSE     Calcium Carbonate-Vit D-Min (CALCIUM 1200 PO) Take 1 capsule by mouth daily.     carvedilol (COREG) 3.125 MG tablet TAKE 1 TABLET BY MOUTH TWICE DAILY 60 tablet 10   cholecalciferol (VITAMIN D3) 25 MCG (1000 UNIT) tablet Take 1,000 Units by mouth daily.     dexamethasone (DECADRON) 4 MG tablet Take 5 tablets (20 mg total) by mouth as directed. Take one hour before monthly Darzalex injections. 15 tablet 3   diphenoxylate-atropine (LOMOTIL) 2.5-0.025 MG tablet Take 1 tablet by mouth 4 (four) times daily as needed for diarrhea or loose stools. 60 tablet 2   Glucerna (GLUCERNA) LIQD Take 237 mLs by mouth.     glucose blood (ACCU-CHEK GUIDE) test strip Use as instructed 100 each 0   hydrOXYzine (ATARAX) 10 MG tablet Take 1 tablet (10 mg total) by mouth at bedtime as needed. 90 tablet 1   insulin glargine (LANTUS SOLOSTAR) 100 UNIT/ML Solostar Pen Inject 10 Units into the skin daily. 6 units every day except Wed and Thursday- 8 units 15 mL 0   losartan (COZAAR) 25 MG tablet TAKE ONE TABLET BY MOUTH EVERY EVENING 90 tablet 0   lovastatin (MEVACOR) 40 MG tablet TAKE ONE TABLET BY MOUTH EVERY evening 90 tablet 3   metFORMIN (GLUCOPHAGE) 1000 MG tablet TAKE 1 TABLET BY MOUTH TWICE DAILY 60 tablet 10   montelukast (SINGULAIR) 10 MG tablet STARTING 2 DAYS BEFORE INFUSION, TAKE 1 TABLET BY MOUTH ONCE DAILY *DO NOT TAKE ON DAY OF INFUSION* 20 tablet 10   ondansetron (ZOFRAN) 8 MG tablet One pill every 8 hours as needed for nausea/vomitting. 40 tablet 1   pantoprazole (PROTONIX) 40 MG tablet Take 1 tablet (40 mg total) by mouth daily. 90 tablet 1   potassium chloride SA (KLOR-CON M) 20 MEQ tablet 1 pill twice a day 60 tablet 3   prochlorperazine (COMPAZINE) 10 MG tablet TAKE 1 TABLET BY MOUTH EVERY 6  HOURS AS NEEDED FOR NAUSEA FOR VOMITING 40 tablet 0   sertraline (ZOLOFT) 25 MG tablet Take 1 tablet (25 mg total) by mouth daily. 90 tablet 1   triamcinolone (NASACORT) 55 MCG/ACT AERO nasal inhaler 2 sprays 2 (two) times daily.     furosemide (  LASIX) 20 MG tablet Take 1 tablet (20 mg total) by mouth every morning. 30 tablet 1   lenalidomide (REVLIMID) 10 MG capsule Take 1 capsule (10 mg total) by mouth daily. Take for 21 days, then hold for 7 days. Repeat every 28 days. 21 capsule 0   losartan (COZAAR) 50 MG tablet Take 1 tablet (50 mg total) by mouth every evening.     No current facility-administered medications for this visit.   Facility-Administered Medications Ordered in Other Visits  Medication Dose Route Frequency Provider Last Rate Last Admin   acetaminophen (TYLENOL) tablet 650 mg  650 mg Oral Once Earna Coder, MD       daratumumab-hyaluronidase-fihj (DARZALEX FASPRO) 1800-30000 MG-UT/15ML chemo SQ injection 1,800 mg  1,800 mg Subcutaneous Once Louretta Shorten R, MD       diphenhydrAMINE (BENADRYL) capsule 50 mg  50 mg Oral Once Earna Coder, MD        PHYSICAL EXAMINATION:  Vitals:   05/08/23 0845 05/08/23 0859  BP: (!) 154/64 (!) 149/63  Pulse: 74   Temp: 97.9 F (36.6 C)   SpO2: 99%        Filed Weights   05/08/23 0845  Weight: 127 lb 1.6 oz (57.7 kg)        Physical Exam Vitals and nursing note reviewed.  HENT:     Head: Normocephalic and atraumatic.     Mouth/Throat:     Pharynx: Oropharynx is clear.  Eyes:     Extraocular Movements: Extraocular movements intact.     Pupils: Pupils are equal, round, and reactive to light.  Cardiovascular:     Rate and Rhythm: Normal rate and regular rhythm.  Pulmonary:     Comments: Decreased breath sounds bilaterally.  Abdominal:     Palpations: Abdomen is soft.  Musculoskeletal:        General: Normal range of motion.     Cervical back: Normal range of motion.  Skin:    General: Skin  is warm.  Neurological:     General: No focal deficit present.     Mental Status: She is alert and oriented to person, place, and time.  Psychiatric:        Behavior: Behavior normal.        Judgment: Judgment normal.      LABORATORY DATA:  I have reviewed the data as listed Lab Results  Component Value Date   WBC 5.2 05/08/2023   HGB 10.4 (L) 05/08/2023   HCT 32.1 (L) 05/08/2023   MCV 107.7 (H) 05/08/2023   PLT 208 05/08/2023   Recent Labs    02/20/23 0854 04/10/23 0955 05/08/23 0832  NA 138 135 136  K 2.6* 4.1 4.2  CL 104 102 105  CO2 24 25 22   GLUCOSE 160* 179* 207*  BUN 12 15 16   CREATININE 0.73 0.97 0.80  CALCIUM 8.9 9.7 9.4  GFRNONAA >60 57* >60  PROT 6.4* 6.7 6.1*  ALBUMIN 3.9 4.4 4.0  AST 19 21 20   ALT 17 15 13   ALKPHOS 46 51 62  BILITOT 0.8 0.6 0.7    RADIOGRAPHIC STUDIES: I have personally reviewed the radiological images as listed and agreed with the findings in the report. No results found.  Multiple myeloma not having achieved remission (HCC) # STAGE I- MULTIPLE MYELOMA -Active [bone lesions; hypercalcemia; anemia; No renal insuffiencey;Bone marrow-32% plasma cell-   STANDARD Cytogenetics].  Currently on Revlimid-Dex- Dara SQ. Partial response noted;  JAN 2024-M protein = NEG;  K/L  RATIO= 1. 6= WNL- stable.   # Proceed with Dara SQ today + DEX 20 mg PO [at home/pre-meds] Labs today reviewed;  ALSO  RE-START Revlimid at 10 mg dose 3 weeks on 1 week off- if worse diarrhea- will discontinue revlimid.   # Hypokalemia severe-DEC K.2.6 - improved today 4.1-ok to take Kdur once a day- stable.      #Reflux-like symptoms-  Secondary to dexamethasone; on PPI- continue low-dose 4 mg weekly/WED.  and wants to hold off GI evaluation [per daughter]- improved. Stable.   # Anemia-moderate- multifactorial-MM/ IDA-  continue trial of gentle iron. Hb [JAn 2024]- 10-11- JUNE 2024- iron sat-13; stable.  # Mild swelling in leg/ likely dependant/compression- no  concerns of DVT- stable. on lasix 20 mg/day- stable.    # Diabetes-on oral hypoglycemic agents; on long acting insulin; 14 units of lantus on T/W/Thursday- Defer to PCP. PBF- BG-204- stable.   # labile BP- recent hypotension- but in general HTN: continue losartan; continue coreg; BP- 150-160- refilled coreg; monitor for now. stable.    # Hypercalcemia secondary to multiple myeloma- [FEB 2023-Zometa ; calcitonin]-calcium today is 9.6. Zometa q 35M- stable.   # Weight loss-s/p re-veluation with Joli -stable.   #DVT/shingles prophylaxis: Aspirin/acyclovirr- stable.   # Vaccinations; Ok with flu/COVID shot  # IV access:PIV   [facility]- [phone]  zometa-q 3 months [l feb 20th, 2025]- ; MM; K/L q 4 W  # DISPOSITION:  # Dara SQ; Zometa  # follow up in 4  weeks/Friday  MD-labs- cbc/cmp;BNP; MM panel;K/L light chains; Dara SQ;possible venofer-Dr.B     All questions were answered. The patient knows to call the clinic with any problems, questions or concerns.    Earna Coder, MD 05/08/2023 9:46 AM

## 2023-05-11 LAB — MULTIPLE MYELOMA PANEL, SERUM
Albumin SerPl Elph-Mcnc: 3.8 g/dL (ref 2.9–4.4)
Albumin/Glob SerPl: 2 — ABNORMAL HIGH (ref 0.7–1.7)
Alpha 1: 0.2 g/dL (ref 0.0–0.4)
Alpha2 Glob SerPl Elph-Mcnc: 0.6 g/dL (ref 0.4–1.0)
B-Globulin SerPl Elph-Mcnc: 0.7 g/dL (ref 0.7–1.3)
Gamma Glob SerPl Elph-Mcnc: 0.5 g/dL (ref 0.4–1.8)
Globulin, Total: 2 g/dL — ABNORMAL LOW (ref 2.2–3.9)
IgA: 62 mg/dL — ABNORMAL LOW (ref 64–422)
IgG (Immunoglobin G), Serum: 580 mg/dL — ABNORMAL LOW (ref 586–1602)
IgM (Immunoglobulin M), Srm: 24 mg/dL — ABNORMAL LOW (ref 26–217)
Total Protein ELP: 5.8 g/dL — ABNORMAL LOW (ref 6.0–8.5)

## 2023-05-11 LAB — KAPPA/LAMBDA LIGHT CHAINS
Kappa free light chain: 10 mg/L (ref 3.3–19.4)
Kappa, lambda light chain ratio: 1.14 (ref 0.26–1.65)
Lambda free light chains: 8.8 mg/L (ref 5.7–26.3)

## 2023-05-12 ENCOUNTER — Ambulatory Visit: Payer: Self-pay | Admitting: Family Medicine

## 2023-05-13 ENCOUNTER — Encounter: Payer: Self-pay | Admitting: Family Medicine

## 2023-05-14 ENCOUNTER — Ambulatory Visit: Payer: Medicare Other | Admitting: Family Medicine

## 2023-05-21 ENCOUNTER — Ambulatory Visit (INDEPENDENT_AMBULATORY_CARE_PROVIDER_SITE_OTHER): Payer: Medicare Other | Admitting: Family Medicine

## 2023-05-21 ENCOUNTER — Encounter: Payer: Self-pay | Admitting: Family Medicine

## 2023-05-21 VITALS — BP 110/66 | HR 71 | Ht 66.0 in | Wt 128.0 lb

## 2023-05-21 DIAGNOSIS — F5102 Adjustment insomnia: Secondary | ICD-10-CM | POA: Diagnosis not present

## 2023-05-21 DIAGNOSIS — E7800 Pure hypercholesterolemia, unspecified: Secondary | ICD-10-CM | POA: Diagnosis not present

## 2023-05-21 DIAGNOSIS — K219 Gastro-esophageal reflux disease without esophagitis: Secondary | ICD-10-CM

## 2023-05-21 DIAGNOSIS — Z794 Long term (current) use of insulin: Secondary | ICD-10-CM

## 2023-05-21 DIAGNOSIS — Z7984 Long term (current) use of oral hypoglycemic drugs: Secondary | ICD-10-CM

## 2023-05-21 DIAGNOSIS — E119 Type 2 diabetes mellitus without complications: Secondary | ICD-10-CM | POA: Diagnosis not present

## 2023-05-21 MED ORDER — METFORMIN HCL 1000 MG PO TABS: 1000.0000 mg | ORAL_TABLET | Freq: Two times a day (BID) | ORAL | 1 refills | Status: DC

## 2023-05-21 MED ORDER — PANTOPRAZOLE SODIUM 40 MG PO TBEC: 40.0000 mg | DELAYED_RELEASE_TABLET | Freq: Every day | ORAL | 1 refills | Status: DC

## 2023-05-21 MED ORDER — HYDROXYZINE HCL 10 MG PO TABS: 10.0000 mg | ORAL_TABLET | Freq: Every evening | ORAL | 1 refills | Status: AC | PRN

## 2023-05-21 MED ORDER — LOVASTATIN 40 MG PO TABS: 40.0000 mg | ORAL_TABLET | Freq: Every evening | ORAL | 1 refills | Status: DC

## 2023-05-21 NOTE — Progress Notes (Signed)
 Date:  05/21/2023   Name:  Cristina Morgan   DOB:  [birth year]   MRN:  [id number]   Chief Complaint: Hypertension and Diabetes  Hypertension This is a chronic problem. The current episode started more than 1 year ago. The problem has been gradually improving since onset. The problem is controlled. Pertinent negatives include no anxiety, blurred vision, chest pain, headaches, malaise/fatigue, neck pain, orthopnea, palpitations, peripheral edema, PND, shortness of breath or sweats. There are no associated agents to hypertension. Risk factors for coronary artery disease include dyslipidemia. Past treatments include angiotensin blockers, diuretics and alpha 1 blockers. The current treatment provides moderate improvement. There are no compliance problems.  There is no history of CAD/MI or CVA. There is no history of chronic renal disease, a hypertension causing med or renovascular disease.  Diabetes She presents for her follow-up diabetic visit. She has type 2 diabetes mellitus. Her disease course has been stable. There are no hypoglycemic associated symptoms. Pertinent negatives for hypoglycemia include no headaches or sweats. Pertinent negatives for diabetes include no blurred vision, no chest pain, no fatigue, no foot paresthesias, no foot ulcerations, no polydipsia, no polyphagia, no polyuria, no visual change, no weakness and no weight loss. There are no hypoglycemic complications. Symptoms are stable. There are no diabetic complications. Pertinent negatives for diabetic complications include no CVA. Current diabetic treatment includes oral agent (monotherapy). Her weight is stable. She is following a generally healthy diet. Meal planning includes avoidance of concentrated sweets and carbohydrate counting. She participates in exercise intermittently. Her home blood glucose trend is fluctuating minimally. Her breakfast blood glucose range is generally 90-110 mg/dl. An ACE inhibitor/angiotensin II receptor  blocker is being taken.    Lab Results  Component Value Date   NA 136 05/08/2023   K 4.2 05/08/2023   CO2 22 05/08/2023   GLUCOSE 207 (H) 05/08/2023   BUN 16 05/08/2023   CREATININE 0.80 05/08/2023   CALCIUM 9.4 05/08/2023   EGFR 68 02/01/2021   GFRNONAA >60 05/08/2023   Lab Results  Component Value Date   CHOL 146 05/09/2022   HDL 67 05/09/2022   LDLCALC 74 05/09/2022   TRIG 26 05/09/2022   CHOLHDL 2.2 05/09/2022   Lab Results  Component Value Date   TSH 1.330 01/23/2023   Lab Results  Component Value Date   HGBA1C 7.0 (H) 01/09/2023   Lab Results  Component Value Date   WBC 5.2 05/08/2023   HGB 10.4 (L) 05/08/2023   HCT 32.1 (L) 05/08/2023   MCV 107.7 (H) 05/08/2023   PLT 208 05/08/2023   Lab Results  Component Value Date   ALT 13 05/08/2023   AST 20 05/08/2023   ALKPHOS 62 05/08/2023   BILITOT 0.7 05/08/2023   Lab Results  Component Value Date   VD25OH 34.32 12/26/2022     Review of Systems  Constitutional:  Negative for fatigue, fever, malaise/fatigue and weight loss.  HENT:  Negative for postnasal drip, rhinorrhea and sore throat.   Eyes:  Negative for blurred vision, photophobia and visual disturbance.  Respiratory:  Negative for cough, chest tightness, shortness of breath and wheezing.   Cardiovascular:  Negative for chest pain, palpitations, orthopnea, leg swelling and PND.  Gastrointestinal:  Negative for abdominal distention and abdominal pain.  Endocrine: Negative for cold intolerance, polydipsia, polyphagia and polyuria.  Genitourinary:  Negative for difficulty urinating.  Musculoskeletal:  Negative for neck pain.  Neurological:  Negative for weakness and headaches.    Patient Active Problem List  Diagnosis Date Noted   Iron deficiency anemia 01/23/2023   Multiple myeloma not having achieved remission (HCC) 05/15/2021   Pressure injury of coccygeal region, stage 2 (HCC) 05/07/2021   Hyponatremia 05/04/2021   Pressure injury of skin  05/02/2021   Vitamin B12 deficiency 05/02/2021   Hypomagnesemia 05/02/2021   Hypophosphatemia 05/02/2021   SVT (supraventricular tachycardia) (HCC) 05/02/2021   Altered mental status 05/01/2021   Acute metabolic encephalopathy 05/01/2021   Hypercalcemia 05/01/2021   Closed compression fracture of second lumbar vertebra (HCC) 10/28/2020   Need for vaccination against Streptococcus pneumoniae using pneumococcal conjugate vaccine 13 01/07/2017   Primary osteoarthritis of right knee 12/08/2016   PNA (pneumonia) 07/09/2015   Acute chest pain 03/20/2014   Diabetes mellitus treated with insulin and oral medication (HCC) 03/20/2014   Hypercholesterolemia 03/20/2014   Essential hypertension 03/20/2014    Allergies  Allergen Reactions   Latex Rash   Penicillins Other (See Comments)    Past Surgical History:  Procedure Laterality Date   CATARACT EXTRACTION Bilateral 2014   COLONOSCOPY  2011   cleared for 5 yrs- [facility]   ECTOPIC PREGNANCY SURGERY      Social History   Tobacco Use   Smoking status: Never    Passive exposure: Never   Smokeless tobacco: Never   Tobacco comments:    smoking cessation materials not required  Vaping Use   Vaping status: Never Used  Substance Use Topics   Alcohol use: No   Drug use: No     Medication list has been reviewed and updated.  Current Meds  Medication Sig   Accu-Chek Softclix Lancets lancets Use as instructed   acetaminophen (TYLENOL) 325 MG tablet Take 325 mg by mouth 2 (two) times daily. START 2 days prior to chemo infusion. Take For 2 days; Do NOT take on the day of infusion   acyclovir (ZOVIRAX) 400 MG tablet TAKE 1 TABLET BY MOUTH TWICE DAILY   aspirin EC 81 MG tablet Take 81 mg by mouth daily. Swallow whole.   BD PEN NEEDLE NANO 2ND GEN 32G X 4 MM MISC USE 1  ONCE DAILY   Blood Glucose Monitoring Suppl (ACCU-CHEK GUIDE) w/Device KIT USE AS DIRECTED TO CHECK GLUCOSE   Calcium Carbonate-Vit D-Min (CALCIUM 1200 PO) Take 1 capsule by  mouth daily.   carvedilol (COREG) 3.125 MG tablet TAKE 1 TABLET BY MOUTH TWICE DAILY   dexamethasone (DECADRON) 4 MG tablet Take 5 tablets (20 mg total) by mouth as directed. Take one hour before monthly Darzalex injections.   diphenoxylate-atropine (LOMOTIL) 2.5-0.025 MG tablet Take 1 tablet by mouth 4 (four) times daily as needed for diarrhea or loose stools.   furosemide (LASIX) 20 MG tablet Take 1 tablet (20 mg total) by mouth every morning.   Glucerna (GLUCERNA) LIQD Take 237 mLs by mouth.   glucose blood (ACCU-CHEK GUIDE) test strip Use as instructed   hydrOXYzine (ATARAX) 10 MG tablet Take 1 tablet (10 mg total) by mouth at bedtime as needed.   insulin glargine (LANTUS SOLOSTAR) 100 UNIT/ML Solostar Pen Inject 10 Units into the skin daily. 6 units every day except Wed and Thursday- 8 units   lenalidomide (REVLIMID) 10 MG capsule Take 1 capsule (10 mg total) by mouth daily. Take for 21 days, then hold for 7 days. Repeat every 28 days.   losartan (COZAAR) 25 MG tablet TAKE ONE TABLET BY MOUTH EVERY EVENING   losartan (COZAAR) 50 MG tablet Take 1 tablet (50 mg total) by mouth every evening.  lovastatin (MEVACOR) 40 MG tablet TAKE ONE TABLET BY MOUTH EVERY evening   metFORMIN (GLUCOPHAGE) 1000 MG tablet TAKE 1 TABLET BY MOUTH TWICE DAILY   montelukast (SINGULAIR) 10 MG tablet STARTING 2 DAYS BEFORE INFUSION, TAKE 1 TABLET BY MOUTH ONCE DAILY *DO NOT TAKE ON DAY OF INFUSION*   ondansetron (ZOFRAN) 8 MG tablet One pill every 8 hours as needed for nausea/vomitting.   pantoprazole (PROTONIX) 40 MG tablet Take 1 tablet (40 mg total) by mouth daily.   potassium chloride SA (KLOR-CON M) 20 MEQ tablet 1 pill twice a day   prochlorperazine (COMPAZINE) 10 MG tablet TAKE 1 TABLET BY MOUTH EVERY 6 HOURS AS NEEDED FOR NAUSEA FOR VOMITING   sertraline (ZOLOFT) 25 MG tablet Take 1 tablet (25 mg total) by mouth daily.   triamcinolone (NASACORT) 55 MCG/ACT AERO nasal inhaler 2 sprays 2 (two) times daily.    [DISCONTINUED] cholecalciferol (VITAMIN D3) 25 MCG (1000 UNIT) tablet Take 1,000 Units by mouth daily.       05/21/2023    2:58 PM 01/09/2023    1:51 PM 09/09/2022    2:40 PM 05/02/2022    2:10 PM  GAD 7 : Generalized Anxiety Score  Nervous, Anxious, on Edge 0 0 0 0  Control/stop worrying 0 0 0 0  Worry too much - different things 0 0 0 0  Trouble relaxing 0 0 0 0  Restless 0 0 0 0  Easily annoyed or irritable 0  0 0  Afraid - awful might happen 0 0 0 0  Total GAD 7 Score 0  0 0  Anxiety Difficulty Not difficult at all Not difficult at all Not difficult at all Not difficult at all       05/21/2023    2:58 PM 01/09/2023    1:51 PM 11/19/2022    1:10 PM  Depression screen PHQ 2/9  Decreased Interest 0 0 0  Down, Depressed, Hopeless 0 0 0  PHQ - 2 Score 0 0 0  Altered sleeping  0 0  Tired, decreased energy  0 0  Change in appetite  0 0  Feeling bad or failure about yourself   0 0  Trouble concentrating  0 0  Moving slowly or fidgety/restless  0 0  Suicidal thoughts  0 0  PHQ-9 Score  0 0  Difficult doing work/chores  Not difficult at all Not difficult at all    BP Readings from Last 3 Encounters:  05/21/23 110/66  05/08/23 (!) 145/71  05/08/23 (!) 149/63    Physical Exam Vitals and nursing note reviewed.  Constitutional:      General: She is not in acute distress.    Appearance: She is not diaphoretic.  HENT:     Head: Normocephalic and atraumatic.     Right Ear: Tympanic membrane and external ear normal. There is no impacted cerumen.     Left Ear: Tympanic membrane and external ear normal. There is no impacted cerumen.     Nose: Nose normal. No congestion or rhinorrhea.     Mouth/Throat:     Mouth: Mucous membranes are moist.     Pharynx: No oropharyngeal exudate or posterior oropharyngeal erythema.  Eyes:     General:        Right eye: No discharge.        Left eye: No discharge.     Conjunctiva/sclera: Conjunctivae normal.     Pupils: Pupils are equal, round,  and reactive to light.  Neck:  Thyroid: No thyromegaly.     Vascular: No JVD.  Cardiovascular:     Rate and Rhythm: Normal rate and regular rhythm.     Heart sounds: Normal heart sounds. No murmur heard.    No friction rub. No gallop.  Pulmonary:     Effort: Pulmonary effort is normal.     Breath sounds: Normal breath sounds. No wheezing, rhonchi or rales.  Abdominal:     General: Bowel sounds are normal.     Palpations: Abdomen is soft. There is no mass.     Tenderness: There is no abdominal tenderness. There is no guarding.  Musculoskeletal:        General: Normal range of motion.     Cervical back: Normal range of motion and neck supple.  Lymphadenopathy:     Cervical: No cervical adenopathy.  Skin:    General: Skin is warm and dry.  Neurological:     Mental Status: She is alert.     Deep Tendon Reflexes: Reflexes are normal and symmetric.     Wt Readings from Last 3 Encounters:  05/21/23 128 lb (58.1 kg)  05/08/23 127 lb 1.6 oz (57.7 kg)  04/10/23 128 lb 8 oz (58.3 kg)    BP 110/66   Pulse 71   Ht 5\' 6"  (1.676 m)   Wt 128 lb (58.1 kg)   SpO2 96%   Breastfeeding No   BMI 20.66 kg/m   Assessment and Plan:  1. Adjustment insomnia Chronic.  Controlled.  Stable.  Does not need to be taken on a nightly basis but when he rises patient does well with hydroxyzine 10 - hydrOXYzine (ATARAX) 10 MG tablet; Take 1 tablet (10 mg total) by mouth at bedtime as needed.  Dispense: 90 tablet; Refill: 1  2. Hypercholesterolemia Controlled.  Stable tolerating medication without myalgia or muscle weakness.  Continue lovastatin 40 mg once a day.  Will check lipid panel for current level of control of LDL.  Will recheck patient in 6 - lovastatin (MEVACOR) 40 MG tablet; Take 1 tablet (40 mg total) by mouth every evening.  Dispense: 90 tablet; Refill: 1 - Lipid Panel With LDL/HDL Ratio  3. Diabetes mellitus treated with insulin and oral medication (HCC) (Primary) Chronic.  Perhaps  uncontrolled because I have seen some elevated blood sugars other than drawn oncology.  Patient is currently not taking her Lantus because she ran out and only is continuing her metformin 1 g twice a day.  I do not know if this is going to be sufficient but we will check an A1c and this will tell the course if are going to need to resume previous dosing of this.  At units.  We will recheck patient in 4 to 6 weeks pending A1c reading today - metFORMIN (GLUCOPHAGE) 1000 MG tablet; Take 1 tablet (1,000 mg total) by mouth 2 (two) times daily.  Dispense: 180 tablet; Refill: 1 - Hemoglobin A1c  4. Gastroesophageal reflux disease without esophagitis Chronic.  Controlled.  Stable.  Asymptomatic.  Tolerating medication well.  Pantoprazole 40 mg once a day.  Will recheck patient - pantoprazole (PROTONIX) 40 MG tablet; Take 1 tablet (40 mg total) by mouth daily.  Dispense: 90 tablet; Refill: 1    Elizabeth Sauer, MD

## 2023-05-22 LAB — LIPID PANEL WITH LDL/HDL RATIO
Cholesterol, Total: 148 mg/dL (ref 100–199)
HDL: 76 mg/dL (ref >39)
LDL Chol Calc (NIH): 59 mg/dL (ref 0–99)
LDL/HDL Ratio: 0.8 ratio (ref 0.0–3.2)
Triglycerides: 61 mg/dL (ref 0–149)
VLDL Cholesterol Cal: 13 mg/dL (ref 5–40)

## 2023-05-22 LAB — HEMOGLOBIN A1C
Est. average glucose Bld gHb Est-mCnc: 177 mg/dL
Hgb A1c MFr Bld: 7.8 % — ABNORMAL HIGH (ref 4.8–5.6)

## 2023-06-01 ENCOUNTER — Telehealth: Payer: Self-pay | Admitting: *Deleted

## 2023-06-01 ENCOUNTER — Other Ambulatory Visit: Payer: Self-pay | Admitting: Internal Medicine

## 2023-06-01 DIAGNOSIS — C9000 Multiple myeloma not having achieved remission: Secondary | ICD-10-CM

## 2023-06-01 NOTE — Telephone Encounter (Signed)
 Needs auth for her revlimid for [employer]

## 2023-06-05 ENCOUNTER — Encounter: Payer: Self-pay | Admitting: Internal Medicine

## 2023-06-05 ENCOUNTER — Inpatient Hospital Stay: Payer: Medicare Other | Admitting: Internal Medicine

## 2023-06-05 ENCOUNTER — Inpatient Hospital Stay: Payer: Medicare Other

## 2023-06-05 ENCOUNTER — Inpatient Hospital Stay: Payer: Medicare Other | Attending: Nurse Practitioner

## 2023-06-05 VITALS — BP 129/59 | HR 72 | Temp 97.8°F | Resp 18 | Ht 66.0 in | Wt 132.0 lb

## 2023-06-05 DIAGNOSIS — R601 Generalized edema: Secondary | ICD-10-CM | POA: Diagnosis not present

## 2023-06-05 DIAGNOSIS — C9000 Multiple myeloma not having achieved remission: Secondary | ICD-10-CM | POA: Diagnosis not present

## 2023-06-05 DIAGNOSIS — Z79899 Other long term (current) drug therapy: Secondary | ICD-10-CM | POA: Diagnosis not present

## 2023-06-05 DIAGNOSIS — R600 Localized edema: Secondary | ICD-10-CM | POA: Diagnosis not present

## 2023-06-05 DIAGNOSIS — Z7961 Long term (current) use of immunomodulator: Secondary | ICD-10-CM | POA: Insufficient documentation

## 2023-06-05 DIAGNOSIS — E876 Hypokalemia: Secondary | ICD-10-CM | POA: Diagnosis not present

## 2023-06-05 DIAGNOSIS — M7989 Other specified soft tissue disorders: Secondary | ICD-10-CM | POA: Insufficient documentation

## 2023-06-05 DIAGNOSIS — E7800 Pure hypercholesterolemia, unspecified: Secondary | ICD-10-CM | POA: Diagnosis not present

## 2023-06-05 DIAGNOSIS — D630 Anemia in neoplastic disease: Secondary | ICD-10-CM | POA: Insufficient documentation

## 2023-06-05 DIAGNOSIS — R197 Diarrhea, unspecified: Secondary | ICD-10-CM | POA: Diagnosis not present

## 2023-06-05 DIAGNOSIS — Z5112 Encounter for antineoplastic immunotherapy: Secondary | ICD-10-CM | POA: Insufficient documentation

## 2023-06-05 DIAGNOSIS — E119 Type 2 diabetes mellitus without complications: Secondary | ICD-10-CM | POA: Insufficient documentation

## 2023-06-05 DIAGNOSIS — I2510 Atherosclerotic heart disease of native coronary artery without angina pectoris: Secondary | ICD-10-CM | POA: Diagnosis not present

## 2023-06-05 DIAGNOSIS — E559 Vitamin D deficiency, unspecified: Secondary | ICD-10-CM

## 2023-06-05 LAB — CMP (CANCER CENTER ONLY)
ALT: 13 U/L (ref 0–44)
AST: 17 U/L (ref 15–41)
Albumin: 4 g/dL (ref 3.5–5.0)
Alkaline Phosphatase: 43 U/L (ref 38–126)
Anion gap: 11 (ref 5–15)
BUN: 11 mg/dL (ref 8–23)
CO2: 24 mmol/L (ref 22–32)
Calcium: 9 mg/dL (ref 8.9–10.3)
Chloride: 102 mmol/L (ref 98–111)
Creatinine: 0.94 mg/dL (ref 0.44–1.00)
GFR, Estimated: 59 mL/min — ABNORMAL LOW (ref >60)
Glucose, Bld: 162 mg/dL — ABNORMAL HIGH (ref 70–99)
Potassium: 3.6 mmol/L (ref 3.5–5.1)
Sodium: 137 mmol/L (ref 135–145)
Total Bilirubin: 0.7 mg/dL (ref 0.0–1.2)
Total Protein: 6.4 g/dL — ABNORMAL LOW (ref 6.5–8.1)

## 2023-06-05 LAB — CBC WITH DIFFERENTIAL/PLATELET
Abs Immature Granulocytes: 0.02 10*3/uL (ref 0.00–0.07)
Basophils Absolute: 0 10*3/uL (ref 0.0–0.1)
Basophils Relative: 1 %
Eosinophils Absolute: 0.1 10*3/uL (ref 0.0–0.5)
Eosinophils Relative: 2 %
HCT: 32.8 % — ABNORMAL LOW (ref 36.0–46.0)
Hemoglobin: 10.8 g/dL — ABNORMAL LOW (ref 12.0–15.0)
Immature Granulocytes: 1 %
Lymphocytes Relative: 31 %
Lymphs Abs: 1.1 10*3/uL (ref 0.7–4.0)
MCH: 35.1 pg — ABNORMAL HIGH (ref 26.0–34.0)
MCHC: 32.9 g/dL (ref 30.0–36.0)
MCV: 106.5 fL — ABNORMAL HIGH (ref 80.0–100.0)
Monocytes Absolute: 0.5 10*3/uL (ref 0.1–1.0)
Monocytes Relative: 15 %
Neutro Abs: 1.8 10*3/uL (ref 1.7–7.7)
Neutrophils Relative %: 50 %
Platelets: 122 10*3/uL — ABNORMAL LOW (ref 150–400)
RBC: 3.08 MIL/uL — ABNORMAL LOW (ref 3.87–5.11)
RDW: 12.7 % (ref 11.5–15.5)
WBC: 3.5 10*3/uL — ABNORMAL LOW (ref 4.0–10.5)
nRBC: 0 % (ref 0.0–0.2)

## 2023-06-05 LAB — BRAIN NATRIURETIC PEPTIDE: B Natriuretic Peptide: 109.2 pg/mL — ABNORMAL HIGH (ref 0.0–100.0)

## 2023-06-05 MED ORDER — ACETAMINOPHEN 325 MG PO TABS: 650.0000 mg | ORAL_TABLET | Freq: Once | ORAL | Status: DC

## 2023-06-05 MED ORDER — SODIUM CHLORIDE 0.9% FLUSH
10.0000 mL | Freq: Once | INTRAVENOUS | Status: DC | PRN
  Filled 2023-06-05: qty 10

## 2023-06-05 MED ORDER — DIPHENHYDRAMINE HCL 25 MG PO CAPS
50.0000 mg | ORAL_CAPSULE | Freq: Once | ORAL | Status: DC
Start: 2023-06-05 — End: 2023-06-05

## 2023-06-05 MED ORDER — IRON SUCROSE 20 MG/ML IV SOLN
200.0000 mg | INTRAVENOUS | Status: DC
  Administered 2023-06-05: 200 mg via INTRAVENOUS
  Filled 2023-06-05: qty 10

## 2023-06-05 MED ORDER — DARATUMUMAB-HYALURONIDASE-FIHJ 1800-30000 MG-UT/15ML ~~LOC~~ SOLN
1800.0000 mg | Freq: Once | SUBCUTANEOUS | Status: AC
  Administered 2023-06-05: 1800 mg via SUBCUTANEOUS
  Filled 2023-06-05: qty 15

## 2023-06-05 NOTE — Assessment & Plan Note (Signed)
#   STAGE I- MULTIPLE MYELOMA -Active [bone lesions; hypercalcemia; anemia; No renal insuffiencey;Bone marrow-32% plasma cell-   [employer]].  Currently on Revlimid-Dex- Dara SQ. Partial response noted;  JAN 2024-M protein = NEG;  K/L RATIO= 1. 6= WNL- stable.   # Proceed with Dara SQ today + DEX 20 mg PO [at home/pre-meds] Labs today reviewed;  ALSO  Revlimid at 10 mg dose 3 weeks on 1 week off- as diarrhea is improved.   # Hypokalemia severe-DEC K.2.6 - improved today 4.1-ok to take Kdur once a day- stable.      #Reflux-like symptoms-  Secondary to dexamethasone; on PPI- continue low-dose 4 mg weekly/WED.  and wants to hold off GI evaluation [per daughter]- improved. Stable.   # Anemia-moderate- multifactorial-MM/ IDA-  continue trial of gentle iron. Hb [JAn 2024]- 10-11- FEB  iron sat-15; stable.    # Mild swelling in leg/ likely dependant/compression- no concerns of DVT- stable. on lasix 20 mg/day- stable.    # Diabetes-on oral hypoglycemic agents; on long acting insulin; 14 units of lantus on T/W/Thursday- Defer to PCP- stable.    # labile BP- recent hypotension- but in general HTN: continue losartan; continue coreg; BP- 150-160- refilled coreg; monitor for now. stable.    # Hypercalcemia secondary to multiple myeloma- [FEB 2023-Zometa ; calcitonin]-calcium today is 9.6. Zometa q 53M- stable.    # Weight loss-s/p re-veluation with Joli -stable.   #DVT/shingles prophylaxis: Aspirin/acyclovirr- stable.   # Vaccinations; Ok with flu/COVID shot  # IV access:PIV   [facility]- [phone]  zometa-q 3 months [l feb 20th, 2025]- ; MM; K/L q 4 W  # DISPOSITION:  # Dara SQ; venofer- # follow up in 4  weeks/Friday  MD-labs- cbc/cmp;BNP; vit D 25-OH MM panel;K/L light chains; Dara SQ; -Dr.B

## 2023-06-05 NOTE — Progress Notes (Signed)
 Yes I am usually evaluated [facility] CONSULT NOTE  Patient Care Team: Duanne Limerick, MD as PCP - General (Family Medicine) Lajean Manes, [facility] (Inactive) (Pharmacist) Earna Coder, MD as Consulting Physician (Oncology)  CHIEF COMPLAINTS/PURPOSE OF CONSULTATION: Multiple myeloma  Oncology History Overview Note  # MULTIPLE MYELOMA  [FEB 2023-hypercalcemia status changes]-Active [bone lesions; hypercalcemia; anemia; No renal insuffiencey;Bone marrow-32% plasma cell-   STANDARD Cytogenetics].FEB 2023- S/p dexamethasone 20 mg q day x4.   # REV [15 mg -3 week-On and 1 week-OFF]-DARA; May 2023-discontinued rev 15 diarrhea/hypotension  # MAY 2023- dara-Rev 10 mg 3w/1w   # FEB 2023-hypercalcemia [[facility]-status post calcitonin; bisphosphonate]  BONE MARROW, ASPIRATE, CLOT, CORE:  -Hypercellular bone marrow with plasma cell neoplasm  -See comment   PERIPHERAL BLOOD:  -Normocytic-normochromic anemia   COMMENT:   The bone marrow is hypercellular for age with increased number of  atypical plasma cells representing 32% of all cells in the aspirate  associated with interstitial infiltrates and numerous variably sized  clusters in the clot/biopsy sections.  The plasma cells display weak  kappa light chain restriction consistent with plasma cell neoplasm.  Correlation with cytogenetic and FISH studies is recommended   MICROSCOPIC DESCRIPTION:   PERIPHERAL BLOOD SMEAR: The red blood cells display mild  anisopoikilocytosis with mild polychromasia.  The white blood cells are  normal number with scattered hypogranular neutrophils. An occasional  myelocyte and large atypical mononuclear cells are seen on scan. The  platelets are normal in number.    Multiple myeloma not having achieved remission (HCC)  05/15/2021 Initial Diagnosis   Multiple myeloma not having achieved remission (HCC)   06/27/2021 - 10/17/2021 Chemotherapy   Patient is on Treatment Plan : MYELOMA  Daratumumab SQ + Lenalidomide + Dexamethasone (DaraRd) q28d     06/27/2021 -  Chemotherapy   Patient is on Treatment Plan : MYELOMA RELAPSED REFRACTORY Daratumumab SQ + Lenalidomide + Dexamethasone (DaraRd) q28d     07/11/2021 Cancer Staging   Staging form: Plasma Cell Myeloma and Plasma Cell Disorders, AJCC 8th Edition - Clinical: Beta-2-microglobulin (mg/L): 2.6, Albumin (g/dL): 3.9, ISS: Stage I - Signed by Earna Coder, MD on 07/11/2021 Stage prefix: Initial diagnosis Beta 2 microglobulin range (mg/L): Less than 3.5 Albumin range (g/dL): Greater than or equal to 3.5    HISTORY OF PRESENTING ILLNESS: pt in a wheel chair; accompanied by her daughter.   Cristina Morgan 89 y.o.  female with  multiple myeloma currently on Dara-Rev-Dex is proceed with Dara SQ today.    Patient last infusion had an episode 207/98 post zometa and darzalex. Recheck bp after 15 minutes and keep for observation. BP after waiting 20 minutes was 145/71. Currently on losartan.   Mild diarrhea on revlimid; prn immoidum for diarrhea.No nausea no vomiting.      Appetite is good.  No weight loss.Denies any blood in stools or black-colored stools. Denies any worsening joint pains.   Review of Systems  Constitutional:  Positive for malaise/fatigue and weight loss. Negative for chills, diaphoresis and fever.  HENT:  Negative for nosebleeds and sore throat.   Eyes:  Negative for double vision.  Respiratory:  Negative for cough, hemoptysis, sputum production, shortness of breath and wheezing.   Cardiovascular:  Negative for chest pain, palpitations, orthopnea and leg swelling.  Gastrointestinal:  Negative for abdominal pain, blood in stool, constipation, diarrhea, heartburn, melena, nausea and vomiting.  Genitourinary:  Negative for dysuria, frequency and urgency.  Musculoskeletal:  Positive for back  pain and joint pain.  Skin: Negative.  Negative for itching and rash.  Neurological:  Negative for dizziness,  tingling, focal weakness, weakness and headaches.  Endo/Heme/Allergies:  Does not bruise/bleed easily.  Psychiatric/Behavioral:  Negative for depression. The patient is not nervous/anxious and does not have insomnia.     MEDICAL HISTORY:  Past Medical History:  Diagnosis Date   Anemia    CAD (coronary artery disease)    Cataracts, bilateral    Closed compression fracture of first lumbar vertebra (HCC)    Closed compression fracture of second lumbar vertebra (HCC)    Diabetes mellitus without complication (HCC)    High cholesterol    History of pneumonia 2010   [place]   History of uterine fibroid    Hypercalcemia    Hyperlipidemia    Hypertension    Lytic bone lesions on xray    Multiple myeloma (HCC)    Osteoporosis    Primary osteoarthritis of right knee     SURGICAL HISTORY: Past Surgical History:  Procedure Laterality Date   CATARACT EXTRACTION Bilateral 2014   COLONOSCOPY  2011   cleared for 5 yrs- [facility]   ECTOPIC PREGNANCY SURGERY      SOCIAL HISTORY: Social History   Socioeconomic History   Marital status: Married    Spouse name: Not on file   Number of children: 2   Years of education: Not on file   Highest education level: 12th grade  Occupational History   Occupation: Retired  Tobacco Use   Smoking status: Never    Passive exposure: Never   Smokeless tobacco: Never   Tobacco comments:    smoking cessation materials not required  Vaping Use   Vaping status: Never Used  Substance and Sexual Activity   Alcohol use: No   Drug use: No   Sexual activity: Not Currently  Other Topics Concern   Not on file  Social History Narrative   [place] with husband; drives. Never smoked; no alcohol. Daughters live close.    Social Drivers of Corporate investment banker Strain: Low Risk  (11/19/2022)   Overall Financial Resource Strain (CARDIA)    Difficulty of Paying Living Expenses: Not very hard  Food Insecurity: No Food Insecurity (05/21/2023)    Hunger Vital Sign    Worried About Running Out of Food in the Last Year: Never true    Ran Out of Food in the Last Year: Never true  Transportation Needs: No Transportation Needs (05/21/2023)   PRAPARE - Administrator, Civil Service (Medical): No    Lack of Transportation (Non-Medical): No  Physical Activity: Sufficiently Active (11/19/2022)   Exercise Vital Sign    Days of Exercise per Week: 5 days    Minutes of Exercise per Session: 30 min  Stress: No Stress Concern Present (11/19/2022)   [employer] of Occupational Health - Occupational Stress Questionnaire    Feeling of Stress : Not at all  Social Connections: Moderately Integrated (11/19/2022)   Social Connection and Isolation Panel [NHANES]    Frequency of Communication with Friends and Family: Twice a week    Frequency of Social Gatherings with Friends and Family: More than three times a week    Attends Religious Services: More than 4 times per year    Active Member of [employer] or Organizations: No    Attends Banker Meetings: Never    Marital Status: Married  Catering manager Violence: Not At Risk (05/21/2023)   Humiliation, Afraid, Rape,  and Kick questionnaire    Fear of Current or Ex-Partner: No    Emotionally Abused: No    Physically Abused: No    Sexually Abused: No    FAMILY HISTORY: Family History  Problem Relation Age of Onset   Cancer Mother        breast   Heart disease Mother    Diabetes Father    Heart disease Father    Heart attack Father    Heart attack Brother    Diabetes Brother     ALLERGIES:  is allergic to latex and penicillins.  MEDICATIONS:  Current Outpatient Medications  Medication Sig Dispense Refill   Accu-Chek Softclix Lancets lancets Use as instructed 100 each 0   acetaminophen (TYLENOL) 325 MG tablet Take 325 mg by mouth 2 (two) times daily. START 2 days prior to chemo infusion. Take For 2 days; Do NOT take on the day of infusion     acyclovir (ZOVIRAX) 400 MG  tablet TAKE 1 TABLET BY MOUTH TWICE DAILY 60 tablet 10   aspirin EC 81 MG tablet Take 81 mg by mouth daily. Swallow whole.     BD PEN NEEDLE NANO 2ND GEN 32G X 4 MM MISC USE 1  ONCE DAILY 100 each 0   Blood Glucose Monitoring Suppl (ACCU-CHEK GUIDE) w/Device KIT USE AS DIRECTED TO CHECK GLUCOSE     Calcium Carbonate-Vit D-Min (CALCIUM 1200 PO) Take 1 capsule by mouth daily.     carvedilol (COREG) 3.125 MG tablet TAKE 1 TABLET BY MOUTH TWICE DAILY 60 tablet 10   dexamethasone (DECADRON) 4 MG tablet Take 5 tablets (20 mg total) by mouth as directed. Take one hour before monthly Darzalex injections. 15 tablet 3   diphenoxylate-atropine (LOMOTIL) 2.5-0.025 MG tablet Take 1 tablet by mouth 4 (four) times daily as needed for diarrhea or loose stools. 60 tablet 2   furosemide (LASIX) 20 MG tablet Take 1 tablet (20 mg total) by mouth every morning. 30 tablet 1   Glucerna (GLUCERNA) LIQD Take 237 mLs by mouth.     glucose blood (ACCU-CHEK GUIDE) test strip Use as instructed 100 each 0   hydrOXYzine (ATARAX) 10 MG tablet Take 1 tablet (10 mg total) by mouth at bedtime as needed. 90 tablet 1   insulin glargine (LANTUS SOLOSTAR) 100 UNIT/ML Solostar Pen Inject 10 Units into the skin daily. 6 units every day except Wed and Thursday- 8 units 15 mL 0   lenalidomide (REVLIMID) 10 MG capsule Take 1 capsule by mouth daily for 21 days, then 7 days off. Repeat every 28 days. 21 capsule 0   losartan (COZAAR) 50 MG tablet Take 1 tablet (50 mg total) by mouth every evening.     lovastatin (MEVACOR) 40 MG tablet Take 1 tablet (40 mg total) by mouth every evening. 90 tablet 1   metFORMIN (GLUCOPHAGE) 1000 MG tablet Take 1 tablet (1,000 mg total) by mouth 2 (two) times daily. 180 tablet 1   montelukast (SINGULAIR) 10 MG tablet STARTING 2 DAYS BEFORE INFUSION, TAKE 1 TABLET BY MOUTH ONCE DAILY *DO NOT TAKE ON DAY OF INFUSION* 20 tablet 10   ondansetron (ZOFRAN) 8 MG tablet One pill every 8 hours as needed for  nausea/vomitting. 40 tablet 1   pantoprazole (PROTONIX) 40 MG tablet Take 1 tablet (40 mg total) by mouth daily. 90 tablet 1   potassium chloride SA (KLOR-CON M) 20 MEQ tablet 1 pill twice a day (Patient taking differently: 20 mEq once. 1 pill twice a day)  60 tablet 3   prochlorperazine (COMPAZINE) 10 MG tablet TAKE 1 TABLET BY MOUTH EVERY 6 HOURS AS NEEDED FOR NAUSEA FOR VOMITING 40 tablet 0   sertraline (ZOLOFT) 25 MG tablet Take 1 tablet (25 mg total) by mouth daily. 90 tablet 1   triamcinolone (NASACORT) 55 MCG/ACT AERO nasal inhaler 2 sprays 2 (two) times daily.     losartan (COZAAR) 25 MG tablet TAKE ONE TABLET BY MOUTH EVERY EVENING (Patient not taking: Reported on 06/05/2023) 90 tablet 0   No current facility-administered medications for this visit.    PHYSICAL EXAMINATION:  Vitals:   06/05/23 0946  BP: (!) 129/59  Pulse: 72  Resp: 18  Temp: 97.8 F (36.6 C)  SpO2: 99%       Filed Weights   06/05/23 0946  Weight: 132 lb (59.9 kg)        Physical Exam Vitals and nursing note reviewed.  HENT:     Head: Normocephalic and atraumatic.     Mouth/Throat:     Pharynx: Oropharynx is clear.  Eyes:     Extraocular Movements: Extraocular movements intact.     Pupils: Pupils are equal, round, and reactive to light.  Cardiovascular:     Rate and Rhythm: Normal rate and regular rhythm.  Pulmonary:     Comments: Decreased breath sounds bilaterally.  Abdominal:     Palpations: Abdomen is soft.  Musculoskeletal:        General: Normal range of motion.     Cervical back: Normal range of motion.  Skin:    General: Skin is warm.  Neurological:     General: No focal deficit present.     Mental Status: She is alert and oriented to person, place, and time.  Psychiatric:        Behavior: Behavior normal.        Judgment: Judgment normal.      LABORATORY DATA:  I have reviewed the data as listed Lab Results  Component Value Date   WBC 3.5 (L) 06/05/2023   HGB 10.8  (L) 06/05/2023   HCT 32.8 (L) 06/05/2023   MCV 106.5 (H) 06/05/2023   PLT 122 (L) 06/05/2023   Recent Labs    04/10/23 0955 05/08/23 0832 06/05/23 0934  NA 135 136 137  K 4.1 4.2 3.6  CL 102 105 102  CO2 25 22 24   GLUCOSE 179* 207* 162*  BUN 15 16 11   CREATININE 0.97 0.80 0.94  CALCIUM 9.7 9.4 9.0  GFRNONAA 57* >60 59*  PROT 6.7 6.1* 6.4*  ALBUMIN 4.4 4.0 4.0  AST 21 20 17   ALT 15 13 13   ALKPHOS 51 62 43  BILITOT 0.6 0.7 0.7    RADIOGRAPHIC STUDIES: I have personally reviewed the radiological images as listed and agreed with the findings in the report. No results found.  Multiple myeloma not having achieved remission (HCC) # STAGE I- MULTIPLE MYELOMA -Active [bone lesions; hypercalcemia; anemia; No renal insuffiencey;Bone marrow-32% plasma cell-   STANDARD Cytogenetics].  Currently on Revlimid-Dex- Dara SQ. Partial response noted;  JAN 2024-M protein = NEG;  K/L RATIO= 1. 6= WNL- stable.   # Proceed with Dara SQ today + DEX 20 mg PO [at home/pre-meds] Labs today reviewed;  ALSO  Revlimid at 10 mg dose 3 weeks on 1 week off- as diarrhea is improved.   # Hypokalemia severe-DEC K.2.6 - improved today 4.1-ok to take Kdur once a day- stable.      #Reflux-like symptoms-  Secondary to dexamethasone;  on PPI- continue low-dose 4 mg weekly/WED.  and wants to hold off GI evaluation [per daughter]- improved. Stable.   # Anemia-moderate- multifactorial-MM/ IDA-  continue trial of gentle iron. Hb [JAn 2024]- 10-11- FEB  iron sat-15; stable.    # Mild swelling in leg/ likely dependant/compression- no concerns of DVT- stable. on lasix 20 mg/day- stable.    # Diabetes-on oral hypoglycemic agents; on long acting insulin; 14 units of lantus on T/W/Thursday- Defer to PCP- stable.    # labile BP- recent hypotension- but in general HTN: continue losartan; continue coreg; BP- 150-160- refilled coreg; monitor for now. stable.    # Hypercalcemia secondary to multiple myeloma- [FEB 2023-Zometa  ; calcitonin]-calcium today is 9.6. Zometa q 59M- stable.    # Weight loss-s/p re-veluation with Joli -stable.   #DVT/shingles prophylaxis: Aspirin/acyclovirr- stable.   # Vaccinations; Ok with flu/COVID shot  # IV access:PIV   [facility]- [phone]  zometa-q 3 months [l feb 20th, 2025]- ; MM; K/L q 4 W  # DISPOSITION:  # Dara SQ; venofer- # follow up in 4  weeks/Friday  MD-labs- cbc/cmp;BNP; vit D 25-OH MM panel;K/L light chains; Dara SQ; -Dr.B      All questions were answered. The patient knows to call the clinic with any problems, questions or concerns.    Earna Coder, MD 06/05/2023 10:52 AM

## 2023-06-05 NOTE — Progress Notes (Signed)
 Dr. Yetta Barre increased losartan to 50 mg every day.

## 2023-06-05 NOTE — Patient Instructions (Signed)

## 2023-06-05 NOTE — Addendum Note (Signed)
 Addended by: Clydia Llano on: 06/05/2023 11:12 AM   Modules accepted: Orders

## 2023-06-08 LAB — KAPPA/LAMBDA LIGHT CHAINS
Kappa free light chain: 19.1 mg/L (ref 3.3–19.4)
Kappa, lambda light chain ratio: 1.33 (ref 0.26–1.65)
Lambda free light chains: 14.4 mg/L (ref 5.7–26.3)

## 2023-06-09 LAB — MULTIPLE MYELOMA PANEL, SERUM
Albumin SerPl Elph-Mcnc: 3.8 g/dL (ref 2.9–4.4)
Albumin/Glob SerPl: 1.7 (ref 0.7–1.7)
Alpha 1: 0.2 g/dL (ref 0.0–0.4)
Alpha2 Glob SerPl Elph-Mcnc: 0.7 g/dL (ref 0.4–1.0)
B-Globulin SerPl Elph-Mcnc: 0.8 g/dL (ref 0.7–1.3)
Gamma Glob SerPl Elph-Mcnc: 0.6 g/dL (ref 0.4–1.8)
Globulin, Total: 2.3 g/dL (ref 2.2–3.9)
IgA: 80 mg/dL (ref 64–422)
IgG (Immunoglobin G), Serum: 666 mg/dL (ref 586–1602)
IgM (Immunoglobulin M), Srm: 37 mg/dL (ref 26–217)
Total Protein ELP: 6.1 g/dL (ref 6.0–8.5)

## 2023-06-26 ENCOUNTER — Telehealth: Payer: Self-pay | Admitting: Family Medicine

## 2023-06-26 NOTE — Telephone Encounter (Signed)
 Copied from CRM [id number]. Topic: Medical Record Request - Provider/Facility Request >> Jun 26, 2023  1:19 PM Ivette P wrote: Reason for CRM: Arlys John called in to verify if office received Prior Authorization for medication, would like a call back to follow up.    Callback [phone]

## 2023-06-26 NOTE — Telephone Encounter (Signed)
 Spoke to daughter she asked her mother while I was on the phone if she knew anything about the authorization she stated she did not know what is was for. Will not be filling out this form.  KP

## 2023-07-01 ENCOUNTER — Other Ambulatory Visit: Payer: Self-pay | Admitting: Internal Medicine

## 2023-07-02 ENCOUNTER — Encounter: Payer: Self-pay | Admitting: Internal Medicine

## 2023-07-02 ENCOUNTER — Other Ambulatory Visit: Payer: Self-pay

## 2023-07-02 ENCOUNTER — Other Ambulatory Visit: Payer: Self-pay | Admitting: *Deleted

## 2023-07-02 DIAGNOSIS — C9000 Multiple myeloma not having achieved remission: Secondary | ICD-10-CM

## 2023-07-03 ENCOUNTER — Other Ambulatory Visit: Payer: Self-pay | Admitting: *Deleted

## 2023-07-03 ENCOUNTER — Inpatient Hospital Stay

## 2023-07-03 ENCOUNTER — Encounter: Payer: Self-pay | Admitting: Internal Medicine

## 2023-07-03 ENCOUNTER — Inpatient Hospital Stay: Attending: Nurse Practitioner

## 2023-07-03 ENCOUNTER — Inpatient Hospital Stay (HOSPITAL_BASED_OUTPATIENT_CLINIC_OR_DEPARTMENT_OTHER): Admitting: Internal Medicine

## 2023-07-03 ENCOUNTER — Telehealth: Payer: Self-pay | Admitting: *Deleted

## 2023-07-03 VITALS — BP 129/50 | HR 66 | Temp 97.4°F | Resp 18 | Ht 66.0 in | Wt 131.9 lb

## 2023-07-03 DIAGNOSIS — E876 Hypokalemia: Secondary | ICD-10-CM | POA: Insufficient documentation

## 2023-07-03 DIAGNOSIS — R634 Abnormal weight loss: Secondary | ICD-10-CM | POA: Insufficient documentation

## 2023-07-03 DIAGNOSIS — M7989 Other specified soft tissue disorders: Secondary | ICD-10-CM | POA: Insufficient documentation

## 2023-07-03 DIAGNOSIS — E119 Type 2 diabetes mellitus without complications: Secondary | ICD-10-CM | POA: Insufficient documentation

## 2023-07-03 DIAGNOSIS — Z5112 Encounter for antineoplastic immunotherapy: Secondary | ICD-10-CM | POA: Insufficient documentation

## 2023-07-03 DIAGNOSIS — R197 Diarrhea, unspecified: Secondary | ICD-10-CM | POA: Insufficient documentation

## 2023-07-03 DIAGNOSIS — E559 Vitamin D deficiency, unspecified: Secondary | ICD-10-CM

## 2023-07-03 DIAGNOSIS — C9000 Multiple myeloma not having achieved remission: Secondary | ICD-10-CM | POA: Insufficient documentation

## 2023-07-03 DIAGNOSIS — I509 Heart failure, unspecified: Secondary | ICD-10-CM

## 2023-07-03 DIAGNOSIS — Z7961 Long term (current) use of immunomodulator: Secondary | ICD-10-CM | POA: Diagnosis not present

## 2023-07-03 DIAGNOSIS — Z7982 Long term (current) use of aspirin: Secondary | ICD-10-CM | POA: Diagnosis not present

## 2023-07-03 DIAGNOSIS — R600 Localized edema: Secondary | ICD-10-CM | POA: Insufficient documentation

## 2023-07-03 DIAGNOSIS — D509 Iron deficiency anemia, unspecified: Secondary | ICD-10-CM | POA: Insufficient documentation

## 2023-07-03 DIAGNOSIS — Z79899 Other long term (current) drug therapy: Secondary | ICD-10-CM | POA: Diagnosis not present

## 2023-07-03 DIAGNOSIS — I10 Essential (primary) hypertension: Secondary | ICD-10-CM | POA: Diagnosis not present

## 2023-07-03 DIAGNOSIS — M810 Age-related osteoporosis without current pathological fracture: Secondary | ICD-10-CM | POA: Insufficient documentation

## 2023-07-03 DIAGNOSIS — R601 Generalized edema: Secondary | ICD-10-CM

## 2023-07-03 LAB — CBC WITH DIFFERENTIAL/PLATELET
Abs Immature Granulocytes: 0.01 10*3/uL (ref 0.00–0.07)
Basophils Absolute: 0.1 10*3/uL (ref 0.0–0.1)
Basophils Relative: 1 %
Eosinophils Absolute: 0.2 10*3/uL (ref 0.0–0.5)
Eosinophils Relative: 5 %
HCT: 33.6 % — ABNORMAL LOW (ref 36.0–46.0)
Hemoglobin: 10.8 g/dL — ABNORMAL LOW (ref 12.0–15.0)
Immature Granulocytes: 0 %
Lymphocytes Relative: 31 %
Lymphs Abs: 1.2 10*3/uL (ref 0.7–4.0)
MCH: 34 pg (ref 26.0–34.0)
MCHC: 32.1 g/dL (ref 30.0–36.0)
MCV: 105.7 fL — ABNORMAL HIGH (ref 80.0–100.0)
Monocytes Absolute: 0.5 10*3/uL (ref 0.1–1.0)
Monocytes Relative: 13 %
Neutro Abs: 1.8 10*3/uL (ref 1.7–7.7)
Neutrophils Relative %: 50 %
Platelets: 153 10*3/uL (ref 150–400)
RBC: 3.18 MIL/uL — ABNORMAL LOW (ref 3.87–5.11)
RDW: 12.5 % (ref 11.5–15.5)
WBC: 3.7 10*3/uL — ABNORMAL LOW (ref 4.0–10.5)
nRBC: 0 % (ref 0.0–0.2)

## 2023-07-03 LAB — CMP (CANCER CENTER ONLY)
ALT: 13 U/L (ref 0–44)
AST: 18 U/L (ref 15–41)
Albumin: 3.9 g/dL (ref 3.5–5.0)
Alkaline Phosphatase: 51 U/L (ref 38–126)
Anion gap: 9 (ref 5–15)
BUN: 14 mg/dL (ref 8–23)
CO2: 23 mmol/L (ref 22–32)
Calcium: 8.5 mg/dL — ABNORMAL LOW (ref 8.9–10.3)
Chloride: 105 mmol/L (ref 98–111)
Creatinine: 1.05 mg/dL — ABNORMAL HIGH (ref 0.44–1.00)
GFR, Estimated: 51 mL/min — ABNORMAL LOW (ref >60)
Glucose, Bld: 163 mg/dL — ABNORMAL HIGH (ref 70–99)
Potassium: 3.5 mmol/L (ref 3.5–5.1)
Sodium: 137 mmol/L (ref 135–145)
Total Bilirubin: 0.8 mg/dL (ref 0.0–1.2)
Total Protein: 6.3 g/dL — ABNORMAL LOW (ref 6.5–8.1)

## 2023-07-03 LAB — BRAIN NATRIURETIC PEPTIDE: B Natriuretic Peptide: 106.9 pg/mL — ABNORMAL HIGH (ref 0.0–100.0)

## 2023-07-03 MED ORDER — LENALIDOMIDE 10 MG PO CAPS: 10.0000 mg | ORAL_CAPSULE | Freq: Every day | ORAL | 0 refills | Status: DC

## 2023-07-03 MED ORDER — LOSARTAN POTASSIUM 25 MG PO TABS: 25.0000 mg | ORAL_TABLET | Freq: Every evening | ORAL | 1 refills | Status: AC

## 2023-07-03 MED ORDER — DIPHENHYDRAMINE HCL 25 MG PO CAPS
50.0000 mg | ORAL_CAPSULE | Freq: Once | ORAL | Status: AC
  Administered 2023-07-03: 50 mg via ORAL
  Filled 2023-07-03: qty 2

## 2023-07-03 MED ORDER — ACETAMINOPHEN 325 MG PO TABS
650.0000 mg | ORAL_TABLET | Freq: Once | ORAL | Status: AC
  Administered 2023-07-03: 650 mg via ORAL
  Filled 2023-07-03: qty 2

## 2023-07-03 MED ORDER — DARATUMUMAB-HYALURONIDASE-FIHJ 1800-30000 MG-UT/15ML ~~LOC~~ SOLN
1800.0000 mg | Freq: Once | SUBCUTANEOUS | Status: AC
  Administered 2023-07-03: 1800 mg via SUBCUTANEOUS
  Filled 2023-07-03: qty 15

## 2023-07-03 MED ORDER — DEXAMETHASONE 4 MG PO TABS
20.0000 mg | ORAL_TABLET | Freq: Once | ORAL | Status: AC
  Administered 2023-07-03: 20 mg via ORAL
  Filled 2023-07-03: qty 5

## 2023-07-03 MED ORDER — DEXAMETHASONE 4 MG PO TABS: 20.0000 mg | ORAL_TABLET | ORAL | 3 refills | Status: DC

## 2023-07-03 NOTE — Patient Instructions (Addendum)
 [facility] - A DEPT OF [facility]  Discharge Instructions: Thank you for choosing [facility] to provide your oncology and hematology care.  If you have a lab appointment with the Cancer Center, please go directly to the Cancer Center and check in at the registration area.  Wear comfortable clothing and clothing appropriate for easy access to any Portacath or PICC line.   We strive to give you quality time with your provider. You may need to reschedule your appointment if you arrive late (15 or more minutes).  Arriving late affects you and other patients whose appointments are after yours.  Also, if you miss three or more appointments without notifying the office, you may be dismissed from the clinic at the provider's discretion.      For prescription refill requests, have your pharmacy contact our office and allow 72 hours for refills to be completed.    Today you received the following chemotherapy and/or immunotherapy agents DARZALEX       To help prevent nausea and vomiting after your treatment, we encourage you to take your nausea medication as directed.  BELOW ARE SYMPTOMS THAT SHOULD BE REPORTED IMMEDIATELY: *FEVER GREATER THAN 100.4 F (38 C) OR HIGHER *CHILLS OR SWEATING *NAUSEA AND VOMITING THAT IS NOT CONTROLLED WITH YOUR NAUSEA MEDICATION *UNUSUAL SHORTNESS OF BREATH *UNUSUAL BRUISING OR BLEEDING *URINARY PROBLEMS (pain or burning when urinating, or frequent urination) *BOWEL PROBLEMS (unusual diarrhea, constipation, pain near the anus) TENDERNESS IN MOUTH AND THROAT WITH OR WITHOUT PRESENCE OF ULCERS (sore throat, sores in mouth, or a toothache) UNUSUAL RASH, SWELLING OR PAIN  UNUSUAL VAGINAL DISCHARGE OR ITCHING   Items with * indicate a potential emergency and should be followed up as soon as possible or go to the Emergency Department if any problems should occur.  Please show the CHEMOTHERAPY ALERT CARD or IMMUNOTHERAPY  ALERT CARD at check-in to the Emergency Department and triage nurse.  Should you have questions after your visit or need to cancel or reschedule your appointment, please contact [facility] - A DEPT OF [facility]  [phone] and follow the prompts.  Office hours are 8:00 a.m. to 4:30 p.m. Monday - Friday. Please note that voicemails left after 4:00 p.m. may not be returned until the following business day.  We are closed weekends and major holidays. You have access to a nurse at all times for urgent questions. Please call the main number to the clinic [phone] and follow the prompts.  For any non-urgent questions, you may also contact your provider using MyChart. We now offer e-Visits for anyone 29 and older to request care online for non-urgent symptoms. For details visit [email or web address].   Also download the MyChart app! Go to the app store, search "MyChart", open the app, select [place], and log in with your MyChart username and password.  Daratumumab ; Hyaluronidase  Injection What is this medication? DARATUMUMAB ; HYALURONIDASE  (dar a toom ue mab; hye al ur ON i dase) treats multiple myeloma, a type of bone marrow cancer. Daratumumab  works by blocking a protein that causes cancer cells to grow and multiply. This helps to slow or stop the spread of cancer cells. Hyaluronidase  works by increasing the absorption of other medications in the body to help them work better. This medication may also be used treat amyloidosis, a condition that causes the buildup of a protein (amyloid) in your body. It works by reducing the buildup  of this protein, which decreases symptoms. It is a combination medication that contains a monoclonal antibody. This medicine may be used for other purposes; ask your health care provider or pharmacist if you have questions. COMMON BRAND NAME(S): DARZALEX  FASPRO What should I tell my care team before I take this medication? They  need to know if you have any of these conditions: Heart disease Infection, such as chickenpox, cold sores, herpes, hepatitis B Lung or breathing disease An unusual or allergic reaction to daratumumab , hyaluronidase , other medications, foods, dyes, or preservatives Pregnant or trying to get pregnant Breast-feeding How should I use this medication? This medication is injected under the skin. It is given by your care team in a hospital or clinic setting. Talk to your care team about the use of this medication in children. Special care may be needed. Overdosage: If you think you have taken too much of this medicine contact a poison control center or emergency room at once. NOTE: This medicine is only for you. Do not share this medicine with others. What if I miss a dose? Keep appointments for follow-up doses. It is important not to miss your dose. Call your care team if you are unable to keep an appointment. What may interact with this medication? Interactions have not been studied. This list may not describe all possible interactions. Give your health care provider a list of all the medicines, herbs, non-prescription drugs, or dietary supplements you use. Also tell them if you smoke, drink alcohol, or use illegal drugs. Some items may interact with your medicine. What should I watch for while using this medication? Your condition will be monitored carefully while you are receiving this medication. This medication can cause serious allergic reactions. To reduce your risk, your care team may give you other medication to take before receiving this one. Be sure to follow the directions from your care team. This medication can affect the results of blood tests to match your blood type. These changes can last for up to 6 months after the final dose. Your care team will do blood tests to match your blood type before you start treatment. Tell all of your care team that you are being treated with this  medication before receiving a blood transfusion. This medication can affect the results of some tests used to determine treatment response; extra tests may be needed to evaluate response. Talk to your care team if you wish to become pregnant or think you are pregnant. This medication can cause serious birth defects if taken during pregnancy and for 3 months after the last dose. A reliable form of contraception is recommended while taking this medication and for 3 months after the last dose. Talk to your care team about effective forms of contraception. Do not breast-feed while taking this medication. What side effects may I notice from receiving this medication? Side effects that you should report to your care team as soon as possible: Allergic reactions--skin rash, itching, hives, swelling of the face, lips, tongue, or throat Heart rhythm changes--fast or irregular heartbeat, dizziness, feeling faint or lightheaded, chest pain, trouble breathing Infection--fever, chills, cough, sore throat, wounds that don't heal, pain or trouble when passing urine, general feeling of discomfort or being unwell Infusion reactions--chest pain, shortness of breath or trouble breathing, feeling faint or lightheaded Sudden eye pain or change in vision such as blurry vision, seeing halos around lights, vision loss Unusual bruising or bleeding Side effects that usually do not require medical attention (report to your  care team if they continue or are bothersome): Constipation Diarrhea Fatigue Nausea Pain, tingling, or numbness in the hands or feet Swelling of the ankles, hands, or feet This list may not describe all possible side effects. Call your doctor for medical advice about side effects. You may report side effects to FDA at 1-800-FDA-1088. Where should I keep my medication? This medication is given in a hospital or clinic. It will not be stored at home. NOTE: This sheet is a summary. It may not cover all  possible information. If you have questions about this medicine, talk to your doctor, pharmacist, or health care provider.  2024 Elsevier/Gold Standard (2021-07-09 00:00:00)   \[id number]\

## 2023-07-03 NOTE — Progress Notes (Signed)
 Nutrition Follow-up:   Patient with multiple myeloma and receiving daratumumab  and revlimid .   Met with patient during infusion.  Patient reports that her appetite is pretty good.  Has issues with diarrhea this past week for a few days.      Medications: reviewed  Labs: reviewed  Anthropometrics:   Weight  131 lb today  128 lb 8 oz on 1/24 126 lb 14.4 oz on 10/11 136 lb on 7/19 131 lb 8 oz on 4/19 128 lb on 05/09/22   NUTRITION DIAGNOSIS: Inadequate oral intake stable   INTERVENTION:  Continue to encourage eating q 2 hours to help maintain weight Continue antidiarrheal medication    MONITORING, EVALUATION, GOAL: weight trends, intake   NEXT VISIT: as needed  Alilah Mcmeans B. Dasie SOLON, CSO, LDN Registered Dietitian [phone]

## 2023-07-03 NOTE — Progress Notes (Signed)
 Yes I am usually evaluated [facility] CONSULT NOTE  Patient Care Team: Clarise Crooks, MD as PCP - General (Family Medicine) Howell Macintosh, [facility] (Inactive) (Pharmacist) Gwyn Leos, MD as Consulting Physician (Oncology)  CHIEF COMPLAINTS/PURPOSE OF CONSULTATION: Multiple myeloma  Oncology History Overview Note  # MULTIPLE MYELOMA  [FEB 2023-hypercalcemia status changes]-Active [bone lesions; hypercalcemia; anemia; No renal insuffiencey;Bone marrow-32% plasma cell-   STANDARD Cytogenetics].FEB 2023- S/p dexamethasone  20 mg q day x4.   # REV [15 mg -3 week-On and 1 week-OFF]-DARA; May 2023-discontinued rev 15 diarrhea/hypotension  # MAY 2023- dara-Rev 10 mg 3w/1w   # FEB 2023-hypercalcemia [[facility]-status post calcitonin; bisphosphonate]  BONE MARROW, ASPIRATE, CLOT, CORE:  -Hypercellular bone marrow with plasma cell neoplasm  -See comment   PERIPHERAL BLOOD:  -Normocytic-normochromic anemia   COMMENT:   The bone marrow is hypercellular for age with increased number of  atypical plasma cells representing 32% of all cells in the aspirate  associated with interstitial infiltrates and numerous variably sized  clusters in the clot/biopsy sections.  The plasma cells display weak  kappa light chain restriction consistent with plasma cell neoplasm.  Correlation with cytogenetic and FISH studies is recommended   MICROSCOPIC DESCRIPTION:   PERIPHERAL BLOOD SMEAR: The red blood cells display mild  anisopoikilocytosis with mild polychromasia.  The white blood cells are  normal number with scattered hypogranular neutrophils. An occasional  myelocyte and large atypical mononuclear cells are seen on scan. The  platelets are normal in number.    Multiple myeloma not having achieved remission (HCC)  05/15/2021 Initial Diagnosis   Multiple myeloma not having achieved remission (HCC)   06/27/2021 - 10/17/2021 Chemotherapy   Patient is on Treatment Plan : MYELOMA  Daratumumab  SQ + Lenalidomide  + Dexamethasone  (DaraRd) q28d     06/27/2021 -  Chemotherapy   Patient is on Treatment Plan : MYELOMA RELAPSED REFRACTORY Daratumumab  SQ + Lenalidomide  + Dexamethasone  (DaraRd) q28d     07/11/2021 Cancer Staging   Staging form: Plasma Cell Myeloma and Plasma Cell Disorders, AJCC 8th Edition - Clinical: Beta-2-microglobulin (mg/L): 2.6, Albumin (g/dL): 3.9, ISS: Stage I - Signed by Gwyn Leos, MD on 07/11/2021 Stage prefix: Initial diagnosis Beta 2 microglobulin range (mg/L): Less than 3.5 Albumin range (g/dL): Greater than or equal to 3.5    HISTORY OF PRESENTING ILLNESS: pt in a wheel chair; accompanied by her daughter.   Cristina Morgan 89 y.o.  female with  multiple myeloma currently on Dara-Rev-Dex is proceed with Dara SQ today.    Patient ran out of the prescription of Dex.  Did not take 1 premed of dexamethasone  today.  Otherwise denies any nausea vomiting or worsening diarrhea.   Mild diarrhea on revlimid ; prn immoidum for diarrhea.No nausea no vomiting.      Appetite is good.  No weight loss.Denies any blood in stools or black-colored stools. Denies any worsening joint pains.   Review of Systems  Constitutional:  Positive for malaise/fatigue and weight loss. Negative for chills, diaphoresis and fever.  HENT:  Negative for nosebleeds and sore throat.   Eyes:  Negative for double vision.  Respiratory:  Negative for cough, hemoptysis, sputum production, shortness of breath and wheezing.   Cardiovascular:  Negative for chest pain, palpitations, orthopnea and leg swelling.  Gastrointestinal:  Negative for abdominal pain, blood in stool, constipation, diarrhea, heartburn, melena, nausea and vomiting.  Genitourinary:  Negative for dysuria, frequency and urgency.  Musculoskeletal:  Positive for back pain and joint pain.  Skin: Negative.  Negative for itching and rash.  Neurological:  Negative for dizziness, tingling, focal weakness, weakness and  headaches.  Endo/Heme/Allergies:  Does not bruise/bleed easily.  Psychiatric/Behavioral:  Negative for depression. The patient is not nervous/anxious and does not have insomnia.     MEDICAL HISTORY:  Past Medical History:  Diagnosis Date   Anemia    CAD (coronary artery disease)    Cataracts, bilateral    Closed compression fracture of first lumbar vertebra (HCC)    Closed compression fracture of second lumbar vertebra (HCC)    Diabetes mellitus without complication (HCC)    High cholesterol    History of pneumonia 2010   [place]   History of uterine fibroid    Hypercalcemia    Hyperlipidemia    Hypertension    Lytic bone lesions on xray    Multiple myeloma (HCC)    Osteoporosis    Primary osteoarthritis of right knee     SURGICAL HISTORY: Past Surgical History:  Procedure Laterality Date   CATARACT EXTRACTION Bilateral 2014   COLONOSCOPY  2011   cleared for 5 yrs- [facility]   ECTOPIC PREGNANCY SURGERY      SOCIAL HISTORY: Social History   Socioeconomic History   Marital status: Married    Spouse name: Not on file   Number of children: 2   Years of education: Not on file   Highest education level: 12th grade  Occupational History   Occupation: Retired  Tobacco Use   Smoking status: Never    Passive exposure: Never   Smokeless tobacco: Never   Tobacco comments:    smoking cessation materials not required  Vaping Use   Vaping status: Never Used  Substance and Sexual Activity   Alcohol use: No   Drug use: No   Sexual activity: Not Currently  Other Topics Concern   Not on file  Social History Narrative   [place] with husband; drives. Never smoked; no alcohol. Daughters live close.    Social Drivers of Corporate investment banker Strain: Low Risk  (11/19/2022)   Overall Financial Resource Strain (CARDIA)    Difficulty of Paying Living Expenses: Not very hard  Food Insecurity: No Food Insecurity (05/21/2023)   Hunger Vital Sign    Worried About  Running Out of Food in the Last Year: Never true    Ran Out of Food in the Last Year: Never true  Transportation Needs: No Transportation Needs (05/21/2023)   PRAPARE - Administrator, Civil Service (Medical): No    Lack of Transportation (Non-Medical): No  Physical Activity: Sufficiently Active (11/19/2022)   Exercise Vital Sign    Days of Exercise per Week: 5 days    Minutes of Exercise per Session: 30 min  Stress: No Stress Concern Present (11/19/2022)   [employer] of Occupational Health - Occupational Stress Questionnaire    Feeling of Stress : Not at all  Social Connections: Moderately Integrated (11/19/2022)   Social Connection and Isolation Panel [NHANES]    Frequency of Communication with Friends and Family: Twice a week    Frequency of Social Gatherings with Friends and Family: More than three times a week    Attends Religious Services: More than 4 times per year    Active Member of [employer] or Organizations: No    Attends Banker Meetings: Never    Marital Status: Married  Catering manager Violence: Not At Risk (05/21/2023)   Humiliation, Afraid, Rape, and Kick questionnaire  Fear of Current or Ex-Partner: No    Emotionally Abused: No    Physically Abused: No    Sexually Abused: No    FAMILY HISTORY: Family History  Problem Relation Age of Onset   Cancer Mother        breast   Heart disease Mother    Diabetes Father    Heart disease Father    Heart attack Father    Heart attack Brother    Diabetes Brother     ALLERGIES:  is allergic to latex and penicillins.  MEDICATIONS:  Current Outpatient Medications  Medication Sig Dispense Refill   Accu-Chek Softclix Lancets lancets Use as instructed 100 each 0   acetaminophen  (TYLENOL ) 325 MG tablet Take 325 mg by mouth 2 (two) times daily. START 2 days prior to chemo infusion. Take For 2 days; Do NOT take on the day of infusion     acyclovir  (ZOVIRAX ) 400 MG tablet TAKE 1 TABLET BY MOUTH TWICE  DAILY 60 tablet 10   aspirin  EC 81 MG tablet Take 81 mg by mouth daily. Swallow whole.     BD PEN NEEDLE NANO 2ND GEN 32G X 4 MM MISC USE 1  ONCE DAILY 100 each 0   Blood Glucose Monitoring Suppl (ACCU-CHEK GUIDE) w/Device KIT USE AS DIRECTED TO CHECK GLUCOSE     Calcium  Carbonate-Vit D-Min (CALCIUM  1200 PO) Take 1 capsule by mouth daily.     carvedilol  (COREG ) 3.125 MG tablet TAKE 1 TABLET BY MOUTH TWICE DAILY 60 tablet 10   diphenoxylate -atropine  (LOMOTIL ) 2.5-0.025 MG tablet TAKE 1 TABLET BY MOUTH FOUR TIMES DAILY AS NEEDED FOR DIARRHEA OR LOOSE STOOLS 60 tablet 3   furosemide  (LASIX ) 20 MG tablet Take 1 tablet (20 mg total) by mouth every morning. 30 tablet 1   Glucerna (GLUCERNA) LIQD Take 237 mLs by mouth.     glucose blood (ACCU-CHEK GUIDE) test strip Use as instructed 100 each 0   hydrOXYzine  (ATARAX ) 10 MG tablet Take 1 tablet (10 mg total) by mouth at bedtime as needed. 90 tablet 1   insulin  glargine (LANTUS  SOLOSTAR) 100 UNIT/ML Solostar Pen Inject 10 Units into the skin daily. 6 units every day except Wed and Thursday- 8 units 15 mL 0   lenalidomide  (REVLIMID ) 10 MG capsule Take 1 capsule by mouth daily for 21 days, then 7 days off. Repeat every 28 days. 21 capsule 0   losartan  (COZAAR ) 50 MG tablet Take 1 tablet (50 mg total) by mouth every evening.     lovastatin  (MEVACOR ) 40 MG tablet Take 1 tablet (40 mg total) by mouth every evening. 90 tablet 1   metFORMIN  (GLUCOPHAGE ) 1000 MG tablet Take 1 tablet (1,000 mg total) by mouth 2 (two) times daily. 180 tablet 1   montelukast  (SINGULAIR ) 10 MG tablet STARTING 2 DAYS BEFORE INFUSION, TAKE 1 TABLET BY MOUTH ONCE DAILY *DO NOT TAKE ON DAY OF INFUSION* 20 tablet 10   ondansetron  (ZOFRAN ) 8 MG tablet One pill every 8 hours as needed for nausea/vomitting. 40 tablet 1   pantoprazole  (PROTONIX ) 40 MG tablet Take 1 tablet (40 mg total) by mouth daily. 90 tablet 1   potassium chloride  SA (KLOR-CON  M) 20 MEQ tablet 1 pill twice a day (Patient  taking differently: 20 mEq once. 1 pill twice a day) 60 tablet 3   prochlorperazine  (COMPAZINE ) 10 MG tablet TAKE 1 TABLET BY MOUTH EVERY 6 HOURS AS NEEDED FOR NAUSEA FOR VOMITING 40 tablet 0   sertraline  (ZOLOFT ) 25 MG tablet  Take 1 tablet (25 mg total) by mouth daily. 90 tablet 1   triamcinolone  (NASACORT ) 55 MCG/ACT AERO nasal inhaler 2 sprays 2 (two) times daily.     dexamethasone  (DECADRON ) 4 MG tablet Take 5 tablets (20 mg total) by mouth as directed. Take one hour before monthly Darzalex  injections. 15 tablet 3   losartan  (COZAAR ) 25 MG tablet Take 1 tablet (25 mg total) by mouth every evening. 90 tablet 1   No current facility-administered medications for this visit.    PHYSICAL EXAMINATION:  Vitals:   07/03/23 0910  BP: (!) 129/50  Pulse: 66  Resp: 18  Temp: (!) 97.4 F (36.3 C)  SpO2: 99%       Filed Weights   07/03/23 0910  Weight: 131 lb 14.4 oz (59.8 kg)        Physical Exam Vitals and nursing note reviewed.  HENT:     Head: Normocephalic and atraumatic.     Mouth/Throat:     Pharynx: Oropharynx is clear.  Eyes:     Extraocular Movements: Extraocular movements intact.     Pupils: Pupils are equal, round, and reactive to light.  Cardiovascular:     Rate and Rhythm: Normal rate and regular rhythm.  Pulmonary:     Comments: Decreased breath sounds bilaterally.  Abdominal:     Palpations: Abdomen is soft.  Musculoskeletal:        General: Normal range of motion.     Cervical back: Normal range of motion.  Skin:    General: Skin is warm.  Neurological:     General: No focal deficit present.     Mental Status: She is alert and oriented to person, place, and time.  Psychiatric:        Behavior: Behavior normal.        Judgment: Judgment normal.      LABORATORY DATA:  I have reviewed the data as listed Lab Results  Component Value Date   WBC 3.7 (L) 07/03/2023   HGB 10.8 (L) 07/03/2023   HCT 33.6 (L) 07/03/2023   MCV 105.7 (H) 07/03/2023    PLT 153 07/03/2023   Recent Labs    05/08/23 0832 06/05/23 0934 07/03/23 0854  NA 136 137 137  K 4.2 3.6 3.5  CL 105 102 105  CO2 22 24 23   GLUCOSE 207* 162* 163*  BUN 16 11 14   CREATININE 0.80 0.94 1.05*  CALCIUM  9.4 9.0 8.5*  GFRNONAA >60 59* 51*  PROT 6.1* 6.4* 6.3*  ALBUMIN 4.0 4.0 3.9  AST 20 17 18   ALT 13 13 13   ALKPHOS 62 43 51  BILITOT 0.7 0.7 0.8    RADIOGRAPHIC STUDIES: I have personally reviewed the radiological images as listed and agreed with the findings in the report. No results found.  Multiple myeloma not having achieved remission (HCC) # STAGE I- MULTIPLE MYELOMA -Active [bone lesions; hypercalcemia; anemia; No renal insuffiencey;Bone marrow-32% plasma cell-   STANDARD Cytogenetics].  Currently on Revlimid -Dex- Dara SQ. Partial response noted;  MARCH 2024-M protein = NEG;  K/L RATIO= 1. 6= WNL- stable.   # Proceed with Dara SQ today + DEX 20 mg PO [at home/pre-meds] Labs today reviewed;  ALSO  Revlimid  at 10 mg dose 3 weeks on 1 week off- as diarrhea is improved. Did not take dex today- will order dex pre med today; and also refilled the script.   # Hypokalemia severe-DEC K.2.6 - improved today 3.5- -continue Kdur once a day- stable.      #  Reflux-like symptoms-  Secondary to dexamethasone ; on PPI- continue low-dose 4 mg weekly/WED.  and wants to hold off GI evaluation [per daughter]- stable.  # Anemia-moderate- multifactorial-MM/ IDA-  continue trial of gentle iron . Hb [JAn 2024]- 10-11- FEB  iron  sat-15stable.  # Mild swelling in leg/ likely dependant/compression- no concerns of DVT- stable. on lasix  20 mg/day-stable.   # Diabetes-on oral hypoglycemic agents; on long acting insulin ; 14 units of lantus  on T/W/Thursday- Defer to PCP-stable.  # labile BP- recent hypotension- but in general HTN: continue losartan ; continue coreg ; BP- 150-160- refilled coreg ; monitor for now. stable.    # Hypercalcemia secondary to multiple myeloma- [FEB 2023-Zometa  ;  calcitonin]-calcium  today is 9.6. Zometa  q 69M- stable.    # Weight loss-s/p re-veluation with Joli -stable.    #DVT/shingles prophylaxis: Aspirin /acyclovirr- stable.    # Vaccinations; Ok with flu/COVID shot  # IV access:PIV   * [place]- [phone] zometa -q 3 months [l feb 20th, 2025]- ; MM; K/L q 4 W  PS # DISPOSITION:  # Dara SQ  # follow up in 4  weeks/Friday  APP-labs- cbc/cmp;BNP; MM panel;K/L light chains; Dara SQ; Zometa  -Dr.B       All questions were answered. The patient knows to call the clinic with any problems, questions or concerns.    Gwyn Leos, MD 07/03/2023 9:45 AM

## 2023-07-03 NOTE — Progress Notes (Signed)
 Refill losartan , pended.

## 2023-07-03 NOTE — Assessment & Plan Note (Addendum)
#   STAGE I- MULTIPLE MYELOMA -Active [bone lesions; hypercalcemia; anemia; No renal insuffiencey;Bone marrow-32% plasma cell-   STANDARD Cytogenetics].  Currently on Revlimid -Dex- Dara SQ. Partial response noted;  MARCH 2024-M protein = NEG;  K/L RATIO= 1. 6= WNL- stable.   # Proceed with Dara SQ today + DEX 20 mg PO [at home/pre-meds] Labs today reviewed;  ALSO  Revlimid  at 10 mg dose 3 weeks on 1 week off- as diarrhea is improved. Did not take dex today- will order dex pre med today; and also refilled the script.   # Hypokalemia severe-DEC K.2.6 - improved today 3.5- -continue Kdur once a day- stable.      #Reflux-like symptoms-  Secondary to dexamethasone ; on PPI- continue low-dose 4 mg weekly/WED.  and wants to hold off GI evaluation [per daughter]- stable.  # Anemia-moderate- multifactorial-MM/ IDA-  continue trial of gentle iron . Hb [JAn 2024]- 10-11- FEB  iron  sat-15stable.  # Mild swelling in leg/ likely dependant/compression- no concerns of DVT- stable. on lasix  20 mg/day-stable.   # Diabetes-on oral hypoglycemic agents; on long acting insulin ; 14 units of lantus  on T/W/Thursday- Defer to PCP-stable.  # labile BP- recent hypotension- but in general HTN: continue losartan ; continue coreg ; BP- 150-160- refilled coreg ; monitor for now. stable.    # Hypercalcemia secondary to multiple myeloma- [FEB 2023-Zometa  ; calcitonin]-calcium  today is 9.6. Zometa  q 68M- stable.    # Weight loss-s/p re-veluation with Joli -stable.    #DVT/shingles prophylaxis: Aspirin /acyclovirr- stable.    # Vaccinations; Ok with flu/COVID shot  # IV access:PIV   * [place]- [phone] zometa -q 3 months [l feb 20th, 2025]- ; MM; K/L q 4 W  PS # DISPOSITION:  # Dara SQ  # follow up in 4  weeks/Friday  APP-labs- cbc/cmp;BNP; MM panel;K/L light chains; Dara SQ; Zometa  -Dr.B

## 2023-07-03 NOTE — Telephone Encounter (Signed)
 [employer] Cristina Morgan. She is asking renew for revlimid  10 mg for 21 days

## 2023-07-04 LAB — VITAMIN D 25 HYDROXY (VIT D DEFICIENCY, FRACTURES): Vit D, 25-Hydroxy: 54.37 ng/mL (ref 30–100)

## 2023-07-06 LAB — KAPPA/LAMBDA LIGHT CHAINS
Kappa free light chain: 20.4 mg/L — ABNORMAL HIGH (ref 3.3–19.4)
Kappa, lambda light chain ratio: 1.32 (ref 0.26–1.65)
Lambda free light chains: 15.4 mg/L (ref 5.7–26.3)

## 2023-07-07 LAB — MULTIPLE MYELOMA PANEL, SERUM
Albumin SerPl Elph-Mcnc: 3.8 g/dL (ref 2.9–4.4)
Albumin/Glob SerPl: 1.7 (ref 0.7–1.7)
Alpha 1: 0.2 g/dL (ref 0.0–0.4)
Alpha2 Glob SerPl Elph-Mcnc: 0.7 g/dL (ref 0.4–1.0)
B-Globulin SerPl Elph-Mcnc: 0.8 g/dL (ref 0.7–1.3)
Gamma Glob SerPl Elph-Mcnc: 0.6 g/dL (ref 0.4–1.8)
Globulin, Total: 2.3 g/dL (ref 2.2–3.9)
IgA: 92 mg/dL (ref 64–422)
IgG (Immunoglobin G), Serum: 640 mg/dL (ref 586–1602)
IgM (Immunoglobulin M), Srm: 24 mg/dL — ABNORMAL LOW (ref 26–217)
Total Protein ELP: 6.1 g/dL (ref 6.0–8.5)

## 2023-07-31 ENCOUNTER — Encounter: Payer: Self-pay | Admitting: Internal Medicine

## 2023-07-31 ENCOUNTER — Ambulatory Visit

## 2023-07-31 ENCOUNTER — Other Ambulatory Visit

## 2023-07-31 ENCOUNTER — Encounter: Payer: Self-pay | Admitting: Nurse Practitioner

## 2023-07-31 ENCOUNTER — Ambulatory Visit: Admitting: Nurse Practitioner

## 2023-07-31 ENCOUNTER — Inpatient Hospital Stay: Attending: Nurse Practitioner

## 2023-07-31 ENCOUNTER — Inpatient Hospital Stay

## 2023-07-31 ENCOUNTER — Inpatient Hospital Stay (HOSPITAL_BASED_OUTPATIENT_CLINIC_OR_DEPARTMENT_OTHER): Admitting: Nurse Practitioner

## 2023-07-31 VITALS — BP 124/43 | HR 62 | Temp 97.4°F | Resp 16 | Wt 126.7 lb

## 2023-07-31 VITALS — BP 140/50 | HR 68 | Temp 97.0°F | Resp 17

## 2023-07-31 DIAGNOSIS — E119 Type 2 diabetes mellitus without complications: Secondary | ICD-10-CM | POA: Insufficient documentation

## 2023-07-31 DIAGNOSIS — C9000 Multiple myeloma not having achieved remission: Secondary | ICD-10-CM | POA: Insufficient documentation

## 2023-07-31 DIAGNOSIS — Z7961 Long term (current) use of immunomodulator: Secondary | ICD-10-CM | POA: Diagnosis not present

## 2023-07-31 DIAGNOSIS — Z5112 Encounter for antineoplastic immunotherapy: Secondary | ICD-10-CM | POA: Insufficient documentation

## 2023-07-31 DIAGNOSIS — R600 Localized edema: Secondary | ICD-10-CM | POA: Diagnosis not present

## 2023-07-31 DIAGNOSIS — R634 Abnormal weight loss: Secondary | ICD-10-CM | POA: Diagnosis not present

## 2023-07-31 DIAGNOSIS — E876 Hypokalemia: Secondary | ICD-10-CM | POA: Diagnosis not present

## 2023-07-31 DIAGNOSIS — I10 Essential (primary) hypertension: Secondary | ICD-10-CM | POA: Insufficient documentation

## 2023-07-31 DIAGNOSIS — Z79899 Other long term (current) drug therapy: Secondary | ICD-10-CM | POA: Insufficient documentation

## 2023-07-31 DIAGNOSIS — D649 Anemia, unspecified: Secondary | ICD-10-CM | POA: Diagnosis not present

## 2023-07-31 DIAGNOSIS — R0989 Other specified symptoms and signs involving the circulatory and respiratory systems: Secondary | ICD-10-CM | POA: Insufficient documentation

## 2023-07-31 DIAGNOSIS — I509 Heart failure, unspecified: Secondary | ICD-10-CM

## 2023-07-31 LAB — CBC WITH DIFFERENTIAL/PLATELET
Abs Immature Granulocytes: 0.02 10*3/uL (ref 0.00–0.07)
Basophils Absolute: 0 10*3/uL (ref 0.0–0.1)
Basophils Relative: 1 %
Eosinophils Absolute: 0.1 10*3/uL (ref 0.0–0.5)
Eosinophils Relative: 4 %
HCT: 33 % — ABNORMAL LOW (ref 36.0–46.0)
Hemoglobin: 10.6 g/dL — ABNORMAL LOW (ref 12.0–15.0)
Immature Granulocytes: 1 %
Lymphocytes Relative: 39 %
Lymphs Abs: 1.3 10*3/uL (ref 0.7–4.0)
MCH: 33.2 pg (ref 26.0–34.0)
MCHC: 32.1 g/dL (ref 30.0–36.0)
MCV: 103.4 fL — ABNORMAL HIGH (ref 80.0–100.0)
Monocytes Absolute: 0.4 10*3/uL (ref 0.1–1.0)
Monocytes Relative: 12 %
Neutro Abs: 1.4 10*3/uL — ABNORMAL LOW (ref 1.7–7.7)
Neutrophils Relative %: 43 %
Platelets: 154 10*3/uL (ref 150–400)
RBC: 3.19 MIL/uL — ABNORMAL LOW (ref 3.87–5.11)
RDW: 12.7 % (ref 11.5–15.5)
WBC: 3.3 10*3/uL — ABNORMAL LOW (ref 4.0–10.5)
nRBC: 0 % (ref 0.0–0.2)

## 2023-07-31 LAB — CMP (CANCER CENTER ONLY)
ALT: 14 U/L (ref 0–44)
AST: 17 U/L (ref 15–41)
Albumin: 3.9 g/dL (ref 3.5–5.0)
Alkaline Phosphatase: 49 U/L (ref 38–126)
Anion gap: 8 (ref 5–15)
BUN: 17 mg/dL (ref 8–23)
CO2: 22 mmol/L (ref 22–32)
Calcium: 8.8 mg/dL — ABNORMAL LOW (ref 8.9–10.3)
Chloride: 106 mmol/L (ref 98–111)
Creatinine: 1.11 mg/dL — ABNORMAL HIGH (ref 0.44–1.00)
GFR, Estimated: 48 mL/min — ABNORMAL LOW (ref >60)
Glucose, Bld: 151 mg/dL — ABNORMAL HIGH (ref 70–99)
Potassium: 3.6 mmol/L (ref 3.5–5.1)
Sodium: 136 mmol/L (ref 135–145)
Total Bilirubin: 0.7 mg/dL (ref 0.0–1.2)
Total Protein: 6.4 g/dL — ABNORMAL LOW (ref 6.5–8.1)

## 2023-07-31 LAB — BRAIN NATRIURETIC PEPTIDE: B Natriuretic Peptide: 80.3 pg/mL (ref 0.0–100.0)

## 2023-07-31 MED ORDER — DARATUMUMAB-HYALURONIDASE-FIHJ 1800-30000 MG-UT/15ML ~~LOC~~ SOLN
1800.0000 mg | Freq: Once | SUBCUTANEOUS | Status: AC
Start: 2023-07-31 — End: 2023-07-31
  Administered 2023-07-31: 1800 mg via SUBCUTANEOUS
  Filled 2023-07-31: qty 15

## 2023-07-31 MED ORDER — ACETAMINOPHEN 325 MG PO TABS
650.0000 mg | ORAL_TABLET | Freq: Once | ORAL | Status: AC
  Administered 2023-07-31: 650 mg via ORAL
  Filled 2023-07-31: qty 2

## 2023-07-31 MED ORDER — DIPHENHYDRAMINE HCL 25 MG PO CAPS
50.0000 mg | ORAL_CAPSULE | Freq: Once | ORAL | Status: DC
Start: 2023-07-31 — End: 2023-07-31

## 2023-07-31 NOTE — Progress Notes (Signed)
 [facility] CONSULT NOTE  Patient Care Team: Clarise Crooks, MD as PCP - General (Family Medicine) Howell Macintosh, [facility] (Inactive) (Pharmacist) Gwyn Leos, MD as Consulting Physician (Oncology)  CHIEF COMPLAINTS/PURPOSE OF CONSULTATION: Multiple myeloma  Oncology History Overview Note  # MULTIPLE MYELOMA  [FEB 2023-hypercalcemia status changes]-Active [bone lesions; hypercalcemia; anemia; No renal insuffiencey;Bone marrow-32% plasma cell-   STANDARD Cytogenetics].FEB 2023- S/p dexamethasone  20 mg q day x4.   # REV [15 mg -3 week-On and 1 week-OFF]-DARA; May 2023-discontinued rev 15 diarrhea/hypotension  # MAY 2023- dara-Rev 10 mg 3w/1w   # FEB 2023-hypercalcemia [[facility]-status post calcitonin; bisphosphonate]  BONE MARROW, ASPIRATE, CLOT, CORE:  -Hypercellular bone marrow with plasma cell neoplasm  -See comment   PERIPHERAL BLOOD:  -Normocytic-normochromic anemia   COMMENT:   The bone marrow is hypercellular for age with increased number of  atypical plasma cells representing 32% of all cells in the aspirate  associated with interstitial infiltrates and numerous variably sized  clusters in the clot/biopsy sections.  The plasma cells display weak  kappa light chain restriction consistent with plasma cell neoplasm.  Correlation with cytogenetic and FISH studies is recommended   MICROSCOPIC DESCRIPTION:   PERIPHERAL BLOOD SMEAR: The red blood cells display mild  anisopoikilocytosis with mild polychromasia.  The white blood cells are  normal number with scattered hypogranular neutrophils. An occasional  myelocyte and large atypical mononuclear cells are seen on scan. The  platelets are normal in number.    Multiple myeloma not having achieved remission (HCC)  05/15/2021 Initial Diagnosis   Multiple myeloma not having achieved remission (HCC)   06/27/2021 - 10/17/2021 Chemotherapy   Patient is on Treatment Plan : MYELOMA Daratumumab  SQ + Lenalidomide  +  Dexamethasone  (DaraRd) q28d     06/27/2021 -  Chemotherapy   Patient is on Treatment Plan : MYELOMA RELAPSED REFRACTORY Daratumumab  SQ + Lenalidomide  + Dexamethasone  (DaraRd) q28d     07/11/2021 Cancer Staging   Staging form: Plasma Cell Myeloma and Plasma Cell Disorders, AJCC 8th Edition - Clinical: Beta-2-microglobulin (mg/L): 2.6, Albumin (g/dL): 3.9, ISS: Stage I - Signed by Gwyn Leos, MD on 07/11/2021 Stage prefix: Initial diagnosis Beta 2 microglobulin range (mg/L): Less than 3.5 Albumin range (g/dL): Greater than or equal to 3.5    HISTORY OF PRESENTING ILLNESS: pt in a wheel chair; accompanied by her daughter.   Cristina Morgan 89 y.o. female with multiple myeloma, currently on dara-rev-dex, who returns to clinic for consideration of Dara SQ today. She continues to feel at baseline. Walks slowly and not often which is unchanged. Has taken her dex today. Denies nausea, vomiting, diarrhea. Continues revlimid . Appetite is stable but weight is down.    Review of Systems  Constitutional:  Positive for malaise/fatigue and weight loss. Negative for chills, diaphoresis and fever.  HENT:  Negative for nosebleeds and sore throat.   Eyes:  Negative for double vision.  Respiratory:  Negative for cough, hemoptysis, sputum production, shortness of breath and wheezing.   Cardiovascular:  Negative for chest pain, palpitations, orthopnea and leg swelling.  Gastrointestinal:  Negative for abdominal pain, blood in stool, constipation, diarrhea, heartburn, melena, nausea and vomiting.  Genitourinary:  Negative for dysuria, frequency and urgency.  Musculoskeletal:  Positive for back pain and joint pain.  Skin: Negative.  Negative for itching and Morgan.  Neurological:  Negative for dizziness, tingling, focal weakness, weakness and headaches.  Endo/Heme/Allergies:  Does not bruise/bleed easily.  Psychiatric/Behavioral:  Negative for depression. The patient  is not nervous/anxious and does not  have insomnia.     MEDICAL HISTORY:  Past Medical History:  Diagnosis Date   Anemia    CAD (coronary artery disease)    Cataracts, bilateral    Closed compression fracture of first lumbar vertebra (HCC)    Closed compression fracture of second lumbar vertebra (HCC)    Diabetes mellitus without complication (HCC)    High cholesterol    History of pneumonia 2010   [place]   History of uterine fibroid    Hypercalcemia    Hyperlipidemia    Hypertension    Lytic bone lesions on xray    Multiple myeloma (HCC)    Osteoporosis    Primary osteoarthritis of right knee    SURGICAL HISTORY: Past Surgical History:  Procedure Laterality Date   CATARACT EXTRACTION Bilateral 2014   COLONOSCOPY  2011   cleared for 5 yrs- [facility]   ECTOPIC PREGNANCY SURGERY     SOCIAL HISTORY: Social History   Socioeconomic History   Marital status: Married    Spouse name: Not on file   Number of children: 2   Years of education: Not on file   Highest education level: 12th grade  Occupational History   Occupation: Retired  Tobacco Use   Smoking status: Never    Passive exposure: Never   Smokeless tobacco: Never   Tobacco comments:    smoking cessation materials not required  Vaping Use   Vaping status: Never Used  Substance and Sexual Activity   Alcohol use: No   Drug use: No   Sexual activity: Not Currently  Other Topics Concern   Not on file  Social History Narrative   [place] with husband; drives. Never smoked; no alcohol. Daughters live close.    Social Drivers of Corporate investment banker Strain: Low Risk  (11/19/2022)   Overall Financial Resource Strain (CARDIA)    Difficulty of Paying Living Expenses: Not very hard  Food Insecurity: No Food Insecurity (05/21/2023)   Hunger Vital Sign    Worried About Running Out of Food in the Last Year: Never true    Ran Out of Food in the Last Year: Never true  Transportation Needs: No Transportation Needs (05/21/2023)   PRAPARE -  Administrator, Civil Service (Medical): No    Lack of Transportation (Non-Medical): No  Physical Activity: Sufficiently Active (11/19/2022)   Exercise Vital Sign    Days of Exercise per Week: 5 days    Minutes of Exercise per Session: 30 min  Stress: No Stress Concern Present (11/19/2022)   [employer] of Occupational Health - Occupational Stress Questionnaire    Feeling of Stress : Not at all  Social Connections: Moderately Integrated (11/19/2022)   Social Connection and Isolation Panel [NHANES]    Frequency of Communication with Friends and Family: Twice a week    Frequency of Social Gatherings with Friends and Family: More than three times a week    Attends Religious Services: More than 4 times per year    Active Member of [employer] or Organizations: No    Attends Banker Meetings: Never    Marital Status: Married  Catering manager Violence: Not At Risk (05/21/2023)   Humiliation, Afraid, Rape, and Kick questionnaire    Fear of Current or Ex-Partner: No    Emotionally Abused: No    Physically Abused: No    Sexually Abused: No   FAMILY HISTORY: Family History  Problem Relation Age of Onset  Cancer Mother        breast   Heart disease Mother    Diabetes Father    Heart disease Father    Heart attack Father    Heart attack Brother    Diabetes Brother    ALLERGIES:  is allergic to latex and penicillins.  MEDICATIONS:  Current Outpatient Medications  Medication Sig Dispense Refill   Accu-Chek Softclix Lancets lancets Use as instructed 100 each 0   acetaminophen  (TYLENOL ) 325 MG tablet Take 325 mg by mouth 2 (two) times daily. START 2 days prior to chemo infusion. Take For 2 days; Do NOT take on the day of infusion     acyclovir  (ZOVIRAX ) 400 MG tablet TAKE 1 TABLET BY MOUTH TWICE DAILY 60 tablet 10   aspirin  EC 81 MG tablet Take 81 mg by mouth daily. Swallow whole.     BD PEN NEEDLE NANO 2ND GEN 32G X 4 MM MISC USE 1  ONCE DAILY 100 each 0   Blood  Glucose Monitoring Suppl (ACCU-CHEK GUIDE) w/Device KIT USE AS DIRECTED TO CHECK GLUCOSE     Calcium  Carbonate-Vit D-Min (CALCIUM  1200 PO) Take 1 capsule by mouth daily.     carvedilol  (COREG ) 3.125 MG tablet TAKE 1 TABLET BY MOUTH TWICE DAILY 60 tablet 10   dexamethasone  (DECADRON ) 4 MG tablet Take 5 tablets (20 mg total) by mouth as directed. Take one hour before monthly Darzalex  injections. 15 tablet 3   diphenoxylate -atropine  (LOMOTIL ) 2.5-0.025 MG tablet TAKE 1 TABLET BY MOUTH FOUR TIMES DAILY AS NEEDED FOR DIARRHEA OR LOOSE STOOLS 60 tablet 3   furosemide  (LASIX ) 20 MG tablet Take 1 tablet (20 mg total) by mouth every morning. 30 tablet 1   Glucerna (GLUCERNA) LIQD Take 237 mLs by mouth.     glucose blood (ACCU-CHEK GUIDE) test strip Use as instructed 100 each 0   hydrOXYzine  (ATARAX ) 10 MG tablet Take 1 tablet (10 mg total) by mouth at bedtime as needed. 90 tablet 1   insulin  glargine (LANTUS  SOLOSTAR) 100 UNIT/ML Solostar Pen Inject 10 Units into the skin daily. 6 units every day except Wed and Thursday- 8 units 15 mL 0   lenalidomide  (REVLIMID ) 10 MG capsule Take 1 capsule (10 mg total) by mouth daily. I capsule daily for 21 days than 7 days off. [employer] Auth # [id number]   Date Obtained 07/03/23 21 capsule 0   losartan  (COZAAR ) 25 MG tablet Take 1 tablet (25 mg total) by mouth every evening. 90 tablet 1   losartan  (COZAAR ) 50 MG tablet Take 1 tablet (50 mg total) by mouth every evening.     lovastatin  (MEVACOR ) 40 MG tablet Take 1 tablet (40 mg total) by mouth every evening. 90 tablet 1   metFORMIN  (GLUCOPHAGE ) 1000 MG tablet Take 1 tablet (1,000 mg total) by mouth 2 (two) times daily. 180 tablet 1   montelukast  (SINGULAIR ) 10 MG tablet STARTING 2 DAYS BEFORE INFUSION, TAKE 1 TABLET BY MOUTH ONCE DAILY *DO NOT TAKE ON DAY OF INFUSION* 20 tablet 10   ondansetron  (ZOFRAN ) 8 MG tablet One pill every 8 hours as needed for nausea/vomitting. 40 tablet 1   pantoprazole  (PROTONIX ) 40 MG tablet Take 1  tablet (40 mg total) by mouth daily. 90 tablet 1   potassium chloride  SA (KLOR-CON  M) 20 MEQ tablet 1 pill twice a day (Patient taking differently: 20 mEq once. 1 pill twice a day) 60 tablet 3   prochlorperazine  (COMPAZINE ) 10 MG tablet TAKE 1 TABLET BY MOUTH EVERY  6 HOURS AS NEEDED FOR NAUSEA FOR VOMITING 40 tablet 0   sertraline  (ZOLOFT ) 25 MG tablet Take 1 tablet (25 mg total) by mouth daily. 90 tablet 1   triamcinolone  (NASACORT ) 55 MCG/ACT AERO nasal inhaler 2 sprays 2 (two) times daily.     No current facility-administered medications for this visit.    PHYSICAL EXAMINATION: Vitals:   07/31/23 0856  BP: (!) 124/43  Pulse: 62  Resp: 16  Temp: (!) 97.4 F (36.3 C)  SpO2: 100%   Filed Weights   07/31/23 0856  Weight: 126 lb 11.2 oz (57.5 kg)   Physical Exam Vitals reviewed.  Constitutional:      Appearance: She is not ill-appearing.     Comments: Accompanied by daughter  HENT:     Head: Normocephalic and atraumatic.     Mouth/Throat:     Pharynx: Oropharynx is clear.  Cardiovascular:     Rate and Rhythm: Normal rate and regular rhythm.  Pulmonary:     Comments: Decreased breath sounds bilaterally.  Abdominal:     General: There is no distension.     Palpations: Abdomen is soft.     Tenderness: There is no abdominal tenderness.  Skin:    General: Skin is warm.     Coloration: Skin is not pale.  Neurological:     Mental Status: She is alert and oriented to person, place, and time.  Psychiatric:        Mood and Affect: Mood normal.        Behavior: Behavior normal.    LABORATORY DATA:  I have reviewed the data as listed Lab Results  Component Value Date   WBC 3.3 (L) 07/31/2023   HGB 10.6 (L) 07/31/2023   HCT 33.0 (L) 07/31/2023   MCV 103.4 (H) 07/31/2023   PLT 154 07/31/2023   Recent Labs    06/05/23 0934 07/03/23 0854 07/31/23 0836  NA 137 137 136  K 3.6 3.5 3.6  CL 102 105 106  CO2 24 23 22   GLUCOSE 162* 163* 151*  BUN 11 14 17   CREATININE  0.94 1.05* 1.11*  CALCIUM  9.0 8.5* 8.8*  GFRNONAA 59* 51* 48*  PROT 6.4* 6.3* 6.4*  ALBUMIN 4.0 3.9 3.9  AST 17 18 17   ALT 13 13 14   ALKPHOS 43 51 49  BILITOT 0.7 0.8 0.7    RADIOGRAPHIC STUDIES: I have personally reviewed the radiological images as listed and agreed with the findings in the report. No results found.  Assessment & Plan:  Multiple myeloma not having achieved remission  # STAGE I- MULTIPLE MYELOMA - Active [bone lesions; hypercalcemia; anemia; No renal insufficiency; Bone marrow- 32% plasma cell- STANDARD Cytogenetics].  Currently on Revlimid -Dex- Dara SQ. Partial response noted;  APRIL 2024-M protein = NEG;  K/L RATIO= 1. 6= WNL- stable.   # April 2025 - M protein - negative, K/L Ratio- 1.32, Stable. Labs reviewed. Proceed with Dara SQ today. Continue Revlimid  10 mg 3 w on 1 w off.    # Hypokalemia - severe. Dec K 2.6 - improved today 3.5- Continue Kdur once a day- stable.       # Reflux-like symptoms-  Secondary to dexamethasone . Continue PPI. Prefers to hold off on GI evaluation- per Daughter. Continue low-dose 4 mg weekly/WED.    # Anemia- moderate- multifactorial- MM/IDA-  continue trial of gentle iron . Hb [Jan 2024]- 10-11- FEB iron  sat-15. stable.   # Mild swelling in leg/ likely dependant/compression- no concerns of DVT- stable. on lasix   20 mg/day-stable.    # Diabetes-on oral hypoglycemic agents; on long acting insulin ; 14 units of lantus  on T/W/Thursday- Defer to PCP-stable.   # labile BP- recent hypotension- but in general HTN: continue losartan ; continue coreg ; BP- 150-160- refilled coreg ; monitor for now. stable.     # Hypercalcemia secondary to multiple myeloma- [FEB 2023-Zometa  ; calcitonin]-calcium  today is 8.5. Hold zometa  today. Encouraged calcium  1200 mg and vitamin d  1000 mcg daily. Plan for zometa  q16m.    # Weight loss-s/p re-veluation with Joli -stable.     #DVT/shingles prophylaxis: Aspirin /acyclovirr- stable.     # Vaccinations; Ok with  flu/COVID shot   # IV access: PIV    Cristina Morgan- [phone] zometa -q 3 months [feb 20th, 2025]- ; MM; K/L q 4 W   PS  # DISPOSITION:  Dara SQ today. Hold zometa  4 weeks- labs (cbc, bmp, MM panel, K/L light chains), Dr Valentine Gasmen, Dara SQ & Zometa - la  No problem-specific Assessment & Plan notes found for this encounter.  All questions were answered. The patient knows to call the clinic with any problems, questions or concerns.   Cristina Balsam, NP 07/31/2023

## 2023-07-31 NOTE — Patient Instructions (Signed)
 [facility] - A DEPT OF [facility]  Discharge Instructions: Thank you for choosing [facility] to provide your oncology and hematology care.  If you have a lab appointment with the Cancer Center, please go directly to the Cancer Center and check in at the registration area.  Wear comfortable clothing and clothing appropriate for easy access to any Portacath or PICC line.   We strive to give you quality time with your provider. You may need to reschedule your appointment if you arrive late (15 or more minutes).  Arriving late affects you and other patients whose appointments are after yours.  Also, if you miss three or more appointments without notifying the office, you may be dismissed from the clinic at the provider's discretion.      For prescription refill requests, have your pharmacy contact our office and allow 72 hours for refills to be completed.    Today you received the following chemotherapy and/or immunotherapy agents Darzalex      To help prevent nausea and vomiting after your treatment, we encourage you to take your nausea medication as directed.  BELOW ARE SYMPTOMS THAT SHOULD BE REPORTED IMMEDIATELY: *FEVER GREATER THAN 100.4 F (38 C) OR HIGHER *CHILLS OR SWEATING *NAUSEA AND VOMITING THAT IS NOT CONTROLLED WITH YOUR NAUSEA MEDICATION *UNUSUAL SHORTNESS OF BREATH *UNUSUAL BRUISING OR BLEEDING *URINARY PROBLEMS (pain or burning when urinating, or frequent urination) *BOWEL PROBLEMS (unusual diarrhea, constipation, pain near the anus) TENDERNESS IN MOUTH AND THROAT WITH OR WITHOUT PRESENCE OF ULCERS (sore throat, sores in mouth, or a toothache) UNUSUAL RASH, SWELLING OR PAIN  UNUSUAL VAGINAL DISCHARGE OR ITCHING   Items with * indicate a potential emergency and should be followed up as soon as possible or go to the Emergency Department if any problems should occur.  Please show the CHEMOTHERAPY ALERT CARD or IMMUNOTHERAPY  ALERT CARD at check-in to the Emergency Department and triage nurse.  Should you have questions after your visit or need to cancel or reschedule your appointment, please contact [facility] - A DEPT OF [facility]  [phone] and follow the prompts.  Office hours are 8:00 a.m. to 4:30 p.m. Monday - Friday. Please note that voicemails left after 4:00 p.m. may not be returned until the following business day.  We are closed weekends and major holidays. You have access to a nurse at all times for urgent questions. Please call the main number to the clinic [phone] and follow the prompts.  For any non-urgent questions, you may also contact your provider using MyChart. We now offer e-Visits for anyone 68 and older to request care online for non-urgent symptoms. For details visit [email or web address].   Also download the MyChart app! Go to the app store, search "MyChart", open the app, select [place], and log in with your MyChart username and password.

## 2023-07-31 NOTE — Patient Instructions (Signed)
 Please be sure you're taking your calcium  and vitamin D  supplements. YOur levels were a bit low today. We'll recheck next month and try to give your Zometa  then.

## 2023-08-03 ENCOUNTER — Other Ambulatory Visit: Payer: Self-pay | Admitting: *Deleted

## 2023-08-03 ENCOUNTER — Encounter: Payer: Self-pay | Admitting: Internal Medicine

## 2023-08-03 DIAGNOSIS — C9000 Multiple myeloma not having achieved remission: Secondary | ICD-10-CM

## 2023-08-03 LAB — KAPPA/LAMBDA LIGHT CHAINS
Kappa free light chain: 20.1 mg/L — ABNORMAL HIGH (ref 3.3–19.4)
Kappa, lambda light chain ratio: 1.47 (ref 0.26–1.65)
Lambda free light chains: 13.7 mg/L (ref 5.7–26.3)

## 2023-08-03 MED ORDER — LENALIDOMIDE 10 MG PO CAPS: 10.0000 mg | ORAL_CAPSULE | Freq: Every day | ORAL | 0 refills | Status: DC

## 2023-08-04 LAB — MULTIPLE MYELOMA PANEL, SERUM
Albumin SerPl Elph-Mcnc: 3.8 g/dL (ref 2.9–4.4)
Albumin/Glob SerPl: 1.9 — ABNORMAL HIGH (ref 0.7–1.7)
Alpha 1: 0.2 g/dL (ref 0.0–0.4)
Alpha2 Glob SerPl Elph-Mcnc: 0.6 g/dL (ref 0.4–1.0)
B-Globulin SerPl Elph-Mcnc: 0.8 g/dL (ref 0.7–1.3)
Gamma Glob SerPl Elph-Mcnc: 0.5 g/dL (ref 0.4–1.8)
Globulin, Total: 2.1 g/dL — ABNORMAL LOW (ref 2.2–3.9)
IgA: 82 mg/dL (ref 64–422)
IgG (Immunoglobin G), Serum: 605 mg/dL (ref 586–1602)
IgM (Immunoglobulin M), Srm: 25 mg/dL — ABNORMAL LOW (ref 26–217)
Total Protein ELP: 5.9 g/dL — ABNORMAL LOW (ref 6.0–8.5)

## 2023-08-17 ENCOUNTER — Other Ambulatory Visit: Payer: Self-pay | Admitting: Internal Medicine

## 2023-08-17 DIAGNOSIS — R600 Localized edema: Secondary | ICD-10-CM

## 2023-08-23 ENCOUNTER — Other Ambulatory Visit: Payer: Self-pay | Admitting: Internal Medicine

## 2023-08-23 DIAGNOSIS — R600 Localized edema: Secondary | ICD-10-CM

## 2023-08-24 ENCOUNTER — Encounter: Payer: Self-pay | Admitting: Internal Medicine

## 2023-08-28 ENCOUNTER — Encounter: Payer: Self-pay | Admitting: Internal Medicine

## 2023-08-28 ENCOUNTER — Ambulatory Visit: Admitting: Internal Medicine

## 2023-08-28 ENCOUNTER — Other Ambulatory Visit

## 2023-08-28 ENCOUNTER — Inpatient Hospital Stay: Attending: Nurse Practitioner

## 2023-08-28 ENCOUNTER — Ambulatory Visit

## 2023-08-28 ENCOUNTER — Inpatient Hospital Stay

## 2023-08-28 ENCOUNTER — Telehealth: Payer: Self-pay | Admitting: *Deleted

## 2023-08-28 ENCOUNTER — Inpatient Hospital Stay (HOSPITAL_BASED_OUTPATIENT_CLINIC_OR_DEPARTMENT_OTHER): Admitting: Internal Medicine

## 2023-08-28 VITALS — BP 134/67 | HR 64 | Temp 97.3°F | Resp 16 | Ht 66.0 in | Wt 131.0 lb

## 2023-08-28 DIAGNOSIS — C9000 Multiple myeloma not having achieved remission: Secondary | ICD-10-CM

## 2023-08-28 DIAGNOSIS — E119 Type 2 diabetes mellitus without complications: Secondary | ICD-10-CM | POA: Diagnosis not present

## 2023-08-28 DIAGNOSIS — I10 Essential (primary) hypertension: Secondary | ICD-10-CM | POA: Insufficient documentation

## 2023-08-28 DIAGNOSIS — Z5112 Encounter for antineoplastic immunotherapy: Secondary | ICD-10-CM | POA: Diagnosis not present

## 2023-08-28 DIAGNOSIS — E876 Hypokalemia: Secondary | ICD-10-CM | POA: Insufficient documentation

## 2023-08-28 DIAGNOSIS — C9002 Multiple myeloma in relapse: Secondary | ICD-10-CM | POA: Insufficient documentation

## 2023-08-28 DIAGNOSIS — R600 Localized edema: Secondary | ICD-10-CM

## 2023-08-28 DIAGNOSIS — D649 Anemia, unspecified: Secondary | ICD-10-CM | POA: Diagnosis not present

## 2023-08-28 LAB — BASIC METABOLIC PANEL - CANCER CENTER ONLY
Anion gap: 10 (ref 5–15)
BUN: 17 mg/dL (ref 8–23)
CO2: 19 mmol/L — ABNORMAL LOW (ref 22–32)
Calcium: 9.2 mg/dL (ref 8.9–10.3)
Chloride: 110 mmol/L (ref 98–111)
Creatinine: 0.95 mg/dL (ref 0.44–1.00)
GFR, Estimated: 58 mL/min — ABNORMAL LOW (ref >60)
Glucose, Bld: 170 mg/dL — ABNORMAL HIGH (ref 70–99)
Potassium: 3.6 mmol/L (ref 3.5–5.1)
Sodium: 139 mmol/L (ref 135–145)

## 2023-08-28 LAB — CBC WITH DIFFERENTIAL/PLATELET
Abs Immature Granulocytes: 0.02 10*3/uL (ref 0.00–0.07)
Basophils Absolute: 0 10*3/uL (ref 0.0–0.1)
Basophils Relative: 1 %
Eosinophils Absolute: 0.1 10*3/uL (ref 0.0–0.5)
Eosinophils Relative: 1 %
HCT: 33.8 % — ABNORMAL LOW (ref 36.0–46.0)
Hemoglobin: 11 g/dL — ABNORMAL LOW (ref 12.0–15.0)
Immature Granulocytes: 1 %
Lymphocytes Relative: 28 %
Lymphs Abs: 1.2 10*3/uL (ref 0.7–4.0)
MCH: 33.1 pg (ref 26.0–34.0)
MCHC: 32.5 g/dL (ref 30.0–36.0)
MCV: 101.8 fL — ABNORMAL HIGH (ref 80.0–100.0)
Monocytes Absolute: 0.3 10*3/uL (ref 0.1–1.0)
Monocytes Relative: 8 %
Neutro Abs: 2.5 10*3/uL (ref 1.7–7.7)
Neutrophils Relative %: 61 %
Platelets: 143 10*3/uL — ABNORMAL LOW (ref 150–400)
RBC: 3.32 MIL/uL — ABNORMAL LOW (ref 3.87–5.11)
RDW: 13.3 % (ref 11.5–15.5)
WBC: 4.1 10*3/uL (ref 4.0–10.5)
nRBC: 0 % (ref 0.0–0.2)

## 2023-08-28 LAB — LACTATE DEHYDROGENASE: LDH: 114 U/L (ref 98–192)

## 2023-08-28 MED ORDER — SODIUM CHLORIDE 0.9 % IV SOLN
Freq: Once | INTRAVENOUS | Status: AC
  Filled 2023-08-28: qty 250

## 2023-08-28 MED ORDER — ACETAMINOPHEN 325 MG PO TABS
650.0000 mg | ORAL_TABLET | Freq: Once | ORAL | Status: AC
  Administered 2023-08-28: 650 mg via ORAL

## 2023-08-28 MED ORDER — ZOLEDRONIC ACID 4 MG/5ML IV CONC
3.0000 mg | Freq: Once | INTRAVENOUS | Status: AC
  Administered 2023-08-28: 3 mg via INTRAVENOUS
  Filled 2023-08-28: qty 3.75

## 2023-08-28 MED ORDER — DARATUMUMAB-HYALURONIDASE-FIHJ 1800-30000 MG-UT/15ML ~~LOC~~ SOLN
1800.0000 mg | Freq: Once | SUBCUTANEOUS | Status: AC
  Filled 2023-08-28: qty 15

## 2023-08-28 MED ORDER — LENALIDOMIDE 10 MG PO CAPS: 10.0000 mg | ORAL_CAPSULE | Freq: Every day | ORAL | 0 refills | Status: DC

## 2023-08-28 MED ORDER — FUROSEMIDE 20 MG PO TABS: 20.0000 mg | ORAL_TABLET | Freq: Every morning | ORAL | 3 refills | Status: DC

## 2023-08-28 MED ORDER — DIPHENHYDRAMINE HCL 25 MG PO CAPS
50.0000 mg | ORAL_CAPSULE | Freq: Once | ORAL | Status: AC
  Administered 2023-08-28: 50 mg via ORAL

## 2023-08-28 NOTE — Progress Notes (Signed)
 I connected with Cristina Morgan on 08/28/23 at  9:45 AM EDT by video enabled telemedicine visit and verified that I am speaking with the correct person using two identifiers.  I discussed the limitations, risks, security and privacy concerns of performing an evaluation and management service by telemedicine and the availability of in-person appointments. I also discussed with the patient that there may be a patient responsible charge related to this service. The patient expressed understanding and agreed to proceed.    Other persons participating in the visit and their role in the encounter: RN/medical reconciliation Patient's location: office Provider's location: home  Oncology History Overview Note  # MULTIPLE MYELOMA  [FEB 2023-hypercalcemia status changes]-Active [bone lesions; hypercalcemia; anemia; No renal insuffiencey;Bone marrow-32% plasma cell-   STANDARD Cytogenetics].FEB 2023- S/p dexamethasone  20 mg q day x4.   # REV [15 mg -3 week-On and 1 week-OFF]-DARA; May 2023-discontinued rev 15 diarrhea/hypotension  # MAY 2023- dara-Rev 10 mg 3w/1w   # FEB 2023-hypercalcemia [[facility]-status post calcitonin; bisphosphonate]  BONE MARROW, ASPIRATE, CLOT, CORE:  -Hypercellular bone marrow with plasma cell neoplasm  -See comment   PERIPHERAL BLOOD:  -Normocytic-normochromic anemia   COMMENT:   The bone marrow is hypercellular for age with increased number of  atypical plasma cells representing 32% of all cells in the aspirate  associated with interstitial infiltrates and numerous variably sized  clusters in the clot/biopsy sections.  The plasma cells display weak  kappa light chain restriction consistent with plasma cell neoplasm.  Correlation with cytogenetic and FISH studies is recommended   MICROSCOPIC DESCRIPTION:   PERIPHERAL BLOOD SMEAR: The red blood cells display mild  anisopoikilocytosis with mild polychromasia.  The white blood cells are  normal number with scattered  hypogranular neutrophils. An occasional  myelocyte and large atypical mononuclear cells are seen on scan. The  platelets are normal in number.    Multiple myeloma not having achieved remission (HCC)  05/15/2021 Initial Diagnosis   Multiple myeloma not having achieved remission (HCC)   06/27/2021 - 10/17/2021 Chemotherapy   Patient is on Treatment Plan : MYELOMA Daratumumab  SQ + Lenalidomide  + Dexamethasone  (DaraRd) q28d     06/27/2021 -  Chemotherapy   Patient is on Treatment Plan : MYELOMA RELAPSED REFRACTORY Daratumumab  SQ + Lenalidomide  + Dexamethasone  (DaraRd) q28d     07/11/2021 Cancer Staging   Staging form: Plasma Cell Myeloma and Plasma Cell Disorders, AJCC 8th Edition - Clinical: Beta-2-microglobulin (mg/L): 2.6, Albumin (g/dL): 3.9, ISS: Stage I - Signed by Gwyn Leos, MD on 07/11/2021 Stage prefix: Initial diagnosis Beta 2 microglobulin range (mg/L): Less than 3.5 Albumin range (g/dL): Greater than or equal to 3.5     Chief Complaint: Multiple myeloma    History of present illness:Cristina Morgan 89 y.o.  female with history of Multiple myeloma   C/o of left arm/ shoulder x 2-3 weeks. No falls; improved with tylenol  as needed.  No swelling in legs; no cough or dyspnea.   Observation/objective: Alert & oriented x 3. In No acute distress.   Assessment and plan: Multiple myeloma not having achieved remission (HCC) # STAGE I- MULTIPLE MYELOMA -Active [bone lesions; hypercalcemia; anemia; No renal insuffiencey;Bone marrow-32% plasma cell-   STANDARD Cytogenetics].  Currently on Revlimid -Dex- Dara SQ. Partial response noted; May 2025 M protein = NEG;  K/L RATIO= 1. 6= WNL- stable.   # Proceed with Dara SQ today + DEX 20 mg PO [at home/pre-meds] Labs today reviewed;  ALSO  Revlimid  at 10 mg dose 3  weeks on 1 week off- as diarrhea is improved.  Stable.  # Hypokalemia severe-DEC K.2.6 - improved today 3.5- -continue Kdur once a day- stable.      #Reflux-like symptoms-   Secondary to dexamethasone ; on PPI- continue low-dose 4 mg weekly/WED.  and wants to hold off GI evaluation [per daughter]- stable.     # Anemia-moderate- multifactorial-MM/ IDA-  continue trial of gentle iron . Hb [JAn 2024]- 10-11- FEB  iron  sat-15stable.  # Mild swelling in leg/ likely dependant/compression- no concerns of DVT- stable. on lasix  20 mg/day-stable.  # Diabetes-on oral hypoglycemic agents; on long acting insulin ; 14 units of lantus  on T/W/Thursday- Defer to PCP-stable.  # labile BP- recent hypotension- but in general HTN: continue losartan ; continue coreg ; BP- 150-160- refilled coreg ; monitor for now.stable.   # Hypercalcemia secondary to multiple myeloma- [FEB 2023-Zometa  ; calcitonin]-Zometa  q 65M- stable.  # Left shoulder pain/arm pain-likely arthritic in nature okay to continue as needed Tylenol .  No concern for any progression of multiple myeloma.  If worse would consider x-rays.  # Weight loss-s/p re-veluation with Joli -stable.    #DVT/shingles prophylaxis: Aspirin /acyclovirr- stable.    # Vaccinations; Ok with flu/COVID shot  # IV access:PIV   # ACP-   * [place]- [phone] zometa -q 3 months [08/28/2023]- ; MM; K/L q 4 W  PS # DISPOSITION:  # Dara SQ ; zometa   # follow up in 4  weeks/Friday  MD: labs- cbc/cmp;BNP; MM panel;K/L light chains; Dara SQ;  -Dr.B  Follow-up instructions:  I discussed the assessment and treatment plan with the patient.  The patient was provided an opportunity to ask questions and all were answered.  The patient agreed with the plan and demonstrated understanding of instructions.  The patient was advised to call back or seek an in person evaluation if the symptoms worsen or if the condition fails to improve as anticipated.    Dr. Hedda Liv [facility] 08/28/2023 11:34 AM

## 2023-08-28 NOTE — Telephone Encounter (Signed)
 x

## 2023-08-28 NOTE — Progress Notes (Signed)
 C/o arms and legs aching x3 weeks, 1/10, tylenol  prn. Not really in her joints.   Needs furosemide  sent in with refills. Pended.

## 2023-08-28 NOTE — Progress Notes (Signed)
 Confirmed patient took po 20 mg decadron  at home Per Rn kelly butler

## 2023-08-28 NOTE — Assessment & Plan Note (Addendum)
#   STAGE I- MULTIPLE MYELOMA -Active [bone lesions; hypercalcemia; anemia; No renal insuffiencey;Bone marrow-32% plasma cell-   STANDARD Cytogenetics].  Currently on Revlimid -Dex- Dara SQ. Partial response noted; May 2025 M protein = NEG;  K/L RATIO= 1. 6= WNL- stable.   # Proceed with Dara SQ today + DEX 20 mg PO [at home/pre-meds] Labs today reviewed;  ALSO  Revlimid  at 10 mg dose 3 weeks on 1 week off- as diarrhea is improved.  Stable.  # Hypokalemia severe-DEC K.2.6 - improved today 3.5- -continue Kdur once a day- stable.      #Reflux-like symptoms-  Secondary to dexamethasone ; on PPI- continue low-dose 4 mg weekly/WED.  and wants to hold off GI evaluation [per daughter]- stable.     # Anemia-moderate- multifactorial-MM/ IDA-  continue trial of gentle iron . Hb [JAn 2024]- 10-11- FEB  iron  sat-15stable.  # Mild swelling in leg/ likely dependant/compression- no concerns of DVT- stable. on lasix  20 mg/day-stable.  # Diabetes-on oral hypoglycemic agents; on long acting insulin ; 14 units of lantus  on T/W/Thursday- Defer to PCP-stable.  # labile BP- recent hypotension- but in general HTN: continue losartan ; continue coreg ; BP- 150-160- refilled coreg ; monitor for now.stable.   # Hypercalcemia secondary to multiple myeloma- [FEB 2023-Zometa  ; calcitonin]-Zometa  q 22M- stable.  # Left shoulder pain/arm pain-likely arthritic in nature okay to continue as needed Tylenol .  No concern for any progression of multiple myeloma.  If worse would consider x-rays.  # Weight loss-s/p re-veluation with Joli -stable.    #DVT/shingles prophylaxis: Aspirin /acyclovirr- stable.    # Vaccinations; Ok with flu/COVID shot  # IV access:PIV   # ACP-   * Shelanda- [phone] zometa -q 3 months [08/28/2023]- ; MM; K/L q 4 W  PS # DISPOSITION:  # Dara SQ ; zometa   # follow up in 4  weeks/Friday  MD: labs- cbc/cmp;BNP; MM panel;K/L light chains; Dara SQ;  -Dr.B

## 2023-08-31 LAB — KAPPA/LAMBDA LIGHT CHAINS
Kappa free light chain: 15.4 mg/L (ref 3.3–19.4)
Kappa, lambda light chain ratio: 1.47 (ref 0.26–1.65)
Lambda free light chains: 10.5 mg/L (ref 5.7–26.3)

## 2023-09-21 ENCOUNTER — Other Ambulatory Visit: Payer: Self-pay | Admitting: *Deleted

## 2023-09-21 DIAGNOSIS — C9000 Multiple myeloma not having achieved remission: Secondary | ICD-10-CM

## 2023-09-21 MED ORDER — LENALIDOMIDE 10 MG PO CAPS
10.0000 mg | ORAL_CAPSULE | Freq: Every day | ORAL | 0 refills | Status: DC
Start: 2023-09-21 — End: 2023-12-02

## 2023-09-25 ENCOUNTER — Encounter: Payer: Self-pay | Admitting: Internal Medicine

## 2023-09-25 ENCOUNTER — Inpatient Hospital Stay (HOSPITAL_BASED_OUTPATIENT_CLINIC_OR_DEPARTMENT_OTHER): Admitting: Internal Medicine

## 2023-09-25 ENCOUNTER — Inpatient Hospital Stay: Attending: Nurse Practitioner

## 2023-09-25 ENCOUNTER — Inpatient Hospital Stay

## 2023-09-25 VITALS — BP 151/53 | HR 69 | Temp 97.8°F | Resp 16 | Ht 66.0 in | Wt 135.0 lb

## 2023-09-25 DIAGNOSIS — R600 Localized edema: Secondary | ICD-10-CM | POA: Insufficient documentation

## 2023-09-25 DIAGNOSIS — Z79899 Other long term (current) drug therapy: Secondary | ICD-10-CM | POA: Insufficient documentation

## 2023-09-25 DIAGNOSIS — C9000 Multiple myeloma not having achieved remission: Secondary | ICD-10-CM

## 2023-09-25 DIAGNOSIS — C9002 Multiple myeloma in relapse: Secondary | ICD-10-CM | POA: Diagnosis not present

## 2023-09-25 DIAGNOSIS — R634 Abnormal weight loss: Secondary | ICD-10-CM | POA: Insufficient documentation

## 2023-09-25 DIAGNOSIS — E119 Type 2 diabetes mellitus without complications: Secondary | ICD-10-CM | POA: Insufficient documentation

## 2023-09-25 DIAGNOSIS — Z794 Long term (current) use of insulin: Secondary | ICD-10-CM | POA: Insufficient documentation

## 2023-09-25 DIAGNOSIS — Z5112 Encounter for antineoplastic immunotherapy: Secondary | ICD-10-CM | POA: Insufficient documentation

## 2023-09-25 DIAGNOSIS — Z7982 Long term (current) use of aspirin: Secondary | ICD-10-CM | POA: Diagnosis not present

## 2023-09-25 DIAGNOSIS — E876 Hypokalemia: Secondary | ICD-10-CM | POA: Diagnosis not present

## 2023-09-25 DIAGNOSIS — Z79624 Long term (current) use of inhibitors of nucleotide synthesis: Secondary | ICD-10-CM | POA: Diagnosis not present

## 2023-09-25 DIAGNOSIS — M791 Myalgia, unspecified site: Secondary | ICD-10-CM | POA: Diagnosis not present

## 2023-09-25 DIAGNOSIS — I10 Essential (primary) hypertension: Secondary | ICD-10-CM | POA: Diagnosis not present

## 2023-09-25 DIAGNOSIS — R197 Diarrhea, unspecified: Secondary | ICD-10-CM | POA: Diagnosis not present

## 2023-09-25 DIAGNOSIS — D509 Iron deficiency anemia, unspecified: Secondary | ICD-10-CM | POA: Diagnosis not present

## 2023-09-25 LAB — BRAIN NATRIURETIC PEPTIDE: B Natriuretic Peptide: 166.1 pg/mL — ABNORMAL HIGH (ref 0.0–100.0)

## 2023-09-25 LAB — CBC WITH DIFFERENTIAL/PLATELET
Abs Immature Granulocytes: 0 K/uL (ref 0.00–0.07)
Basophils Absolute: 0 K/uL (ref 0.0–0.1)
Basophils Relative: 1 %
Eosinophils Absolute: 0.1 K/uL (ref 0.0–0.5)
Eosinophils Relative: 2 %
HCT: 32.7 % — ABNORMAL LOW (ref 36.0–46.0)
Hemoglobin: 10.5 g/dL — ABNORMAL LOW (ref 12.0–15.0)
Immature Granulocytes: 0 %
Lymphocytes Relative: 28 %
Lymphs Abs: 1 K/uL (ref 0.7–4.0)
MCH: 32.5 pg (ref 26.0–34.0)
MCHC: 32.1 g/dL (ref 30.0–36.0)
MCV: 101.2 fL — ABNORMAL HIGH (ref 80.0–100.0)
Monocytes Absolute: 0.4 K/uL (ref 0.1–1.0)
Monocytes Relative: 11 %
Neutro Abs: 2 K/uL (ref 1.7–7.7)
Neutrophils Relative %: 58 %
Platelets: 159 K/uL (ref 150–400)
RBC: 3.23 MIL/uL — ABNORMAL LOW (ref 3.87–5.11)
RDW: 13.8 % (ref 11.5–15.5)
WBC: 3.5 K/uL — ABNORMAL LOW (ref 4.0–10.5)
nRBC: 0 % (ref 0.0–0.2)

## 2023-09-25 LAB — CMP (CANCER CENTER ONLY)
ALT: 16 U/L (ref 0–44)
AST: 22 U/L (ref 15–41)
Albumin: 3.8 g/dL (ref 3.5–5.0)
Alkaline Phosphatase: 52 U/L (ref 38–126)
Anion gap: 6 (ref 5–15)
BUN: 12 mg/dL (ref 8–23)
CO2: 23 mmol/L (ref 22–32)
Calcium: 8.5 mg/dL — ABNORMAL LOW (ref 8.9–10.3)
Chloride: 106 mmol/L (ref 98–111)
Creatinine: 0.99 mg/dL (ref 0.44–1.00)
GFR, Estimated: 55 mL/min — ABNORMAL LOW (ref >60)
Glucose, Bld: 194 mg/dL — ABNORMAL HIGH (ref 70–99)
Potassium: 3.5 mmol/L (ref 3.5–5.1)
Sodium: 135 mmol/L (ref 135–145)
Total Bilirubin: 0.6 mg/dL (ref 0.0–1.2)
Total Protein: 6.2 g/dL — ABNORMAL LOW (ref 6.5–8.1)

## 2023-09-25 MED ORDER — DIPHENHYDRAMINE HCL 25 MG PO CAPS
50.0000 mg | ORAL_CAPSULE | Freq: Once | ORAL | Status: AC
  Administered 2023-09-25: 50 mg via ORAL
  Filled 2023-09-25: qty 2

## 2023-09-25 MED ORDER — ACETAMINOPHEN 325 MG PO TABS
650.0000 mg | ORAL_TABLET | Freq: Once | ORAL | Status: AC
Start: 2023-09-25 — End: 2023-09-25
  Administered 2023-09-25: 650 mg via ORAL
  Filled 2023-09-25: qty 2

## 2023-09-25 MED ORDER — DARATUMUMAB-HYALURONIDASE-FIHJ 1800-30000 MG-UT/15ML ~~LOC~~ SOLN
1800.0000 mg | Freq: Once | SUBCUTANEOUS | Status: AC
  Administered 2023-09-25: 1800 mg via SUBCUTANEOUS
  Filled 2023-09-25: qty 15

## 2023-09-25 NOTE — Progress Notes (Signed)
 No concerns today

## 2023-09-25 NOTE — Assessment & Plan Note (Addendum)
#   STAGE I- MULTIPLE MYELOMA -Active [bone lesions; hypercalcemia; anemia; No renal insuffiencey;Bone marrow-32% plasma cell-   STANDARD Cytogenetics].  Currently on Revlimid -Dex- Dara SQ. Partial response noted; JUNE  2025 M protein = NEG;  K/L RATIO= 1. 6= WNL- stable.   # Proceed with Dara SQ today + DEX 20 mg PO [at home/pre-meds] Labs today reviewed;  ALSO  Revlimid  at 10 mg dose 3 weeks on 1 week off- as diarrhea is improved.  Stable.  # Hypokalemia severe-DEC K.2.6 - improved today 3.5- -continue Kdur once a day-  Stable.  # Achy joints/muscle- trial of magnesium  OTC.   #Reflux-like symptoms-  Secondary to dexamethasone ; on PPI- continue low-dose 4 mg weekly/WED.  and wants to hold off GI evaluation [per daughter]- stable.     # Anemia-moderate- multifactorial-MM/ IDA-  continue trial of gentle iron . Hb [JAn 2024]- 10-11- FEB  iron  sat-15stable.  # Mild swelling in leg/ likely dependant/compression- no concerns of DVT- stable. on lasix  20 mg/day-stable.  # Diabetes-on oral hypoglycemic agents; on long acting insulin ; 14 units of lantus  on T/W/Thursday- Defer to PCP-stable.  # labile BP- recent hypotension- but in general HTN: continue losartan ; continue coreg ; BP- 150-160- refilled coreg ; monitor for now.stable.   # Hypercalcemia secondary to multiple myeloma- [FEB 2023-Zometa  ; calcitonin]-Zometa  q 44M- stable.  # Left shoulder pain/arm pain-likely arthritic in nature okay to continue as needed Tylenol .  No concern for any progression of multiple myeloma.  If worse would consider x-rays.  # Weight loss-s/p re-evaluation with Joli -stable.    #DVT/shingles prophylaxis: Aspirin /acyclovirr- stable.    # Vaccinations; Ok with flu/COVID shot  # IV access:PIV   # ACP-   * Shelanda- [phone] zometa -q 3 months [08/28/2023]- ; MM; K/L q 4 W  PS # DISPOSITION:  # Dara SQ ; zometa   # follow up in 4  weeks/Friday  MD: labs- cbc/cmp;BNP; MM panel;K/L light chains; Dara SQ;  -Dr.B

## 2023-09-25 NOTE — Progress Notes (Signed)
 Yes I am usually evaluated [facility] CONSULT NOTE  Patient Care Team: Joshua Cathryne BROCKS, MD (Inactive) as PCP - General (Family Medicine) Arloa Mliss RAMAN, [facility] (Inactive) (Pharmacist) Rennie Cindy SAUNDERS, MD as Consulting Physician (Oncology)  CHIEF COMPLAINTS/PURPOSE OF CONSULTATION: Multiple myeloma  Oncology History Overview Note  # MULTIPLE MYELOMA  [FEB 2023-hypercalcemia status changes]-Active [bone lesions; hypercalcemia; anemia; No renal insuffiencey;Bone marrow-32% plasma cell-   STANDARD Cytogenetics].FEB 2023- S/p dexamethasone  20 mg q day x4.   # REV [15 mg -3 week-On and 1 week-OFF]-DARA; May 2023-discontinued rev 15 diarrhea/hypotension  # MAY 2023- dara-Rev 10 mg 3w/1w   # FEB 2023-hypercalcemia [[facility]-status post calcitonin; bisphosphonate]  BONE MARROW, ASPIRATE, CLOT, CORE:  -Hypercellular bone marrow with plasma cell neoplasm  -See comment   PERIPHERAL BLOOD:  -Normocytic-normochromic anemia   COMMENT:   The bone marrow is hypercellular for age with increased number of  atypical plasma cells representing 32% of all cells in the aspirate  associated with interstitial infiltrates and numerous variably sized  clusters in the clot/biopsy sections.  The plasma cells display weak  kappa light chain restriction consistent with plasma cell neoplasm.  Correlation with cytogenetic and FISH studies is recommended   MICROSCOPIC DESCRIPTION:   PERIPHERAL BLOOD SMEAR: The red blood cells display mild  anisopoikilocytosis with mild polychromasia.  The white blood cells are  normal number with scattered hypogranular neutrophils. An occasional  myelocyte and large atypical mononuclear cells are seen on scan. The  platelets are normal in number.    Multiple myeloma not having achieved remission (HCC)  05/15/2021 Initial Diagnosis   Multiple myeloma not having achieved remission (HCC)   06/27/2021 - 10/17/2021 Chemotherapy   Patient is on Treatment Plan :  MYELOMA Daratumumab  SQ + Lenalidomide  + Dexamethasone  (DaraRd) q28d     06/27/2021 -  Chemotherapy   Patient is on Treatment Plan : MYELOMA RELAPSED REFRACTORY Daratumumab  SQ + Lenalidomide  + Dexamethasone  (DaraRd) q28d     07/11/2021 Cancer Staging   Staging form: Plasma Cell Myeloma and Plasma Cell Disorders, AJCC 8th Edition - Clinical: Beta-2-microglobulin (mg/L): 2.6, Albumin (g/dL): 3.9, ISS: Stage I - Signed by Rennie Cindy SAUNDERS, MD on 07/11/2021 Stage prefix: Initial diagnosis Beta 2 microglobulin range (mg/L): Less than 3.5 Albumin range (g/dL): Greater than or equal to 3.5    HISTORY OF PRESENTING ILLNESS: pt in a wheel chair; accompanied by her daughter.   Cristina Morgan 89 y.o.  female with  multiple myeloma currently on Dara-Rev-Dex is proceed with Dara SQ today.    Patient  denies any nausea vomiting or worsening diarrhea.  Patient has chronic mild diarrhea on revlimid ; prn immoidum for diarrhea.No nausea no vomiting.      Admits to mild achy joints/myalgias.   Appetite is good.  No weight loss .Denies any blood in stools or black-colored stools.   Review of Systems  Constitutional:  Positive for malaise/fatigue and weight loss. Negative for chills, diaphoresis and fever.  HENT:  Negative for nosebleeds and sore throat.   Eyes:  Negative for double vision.  Respiratory:  Negative for cough, hemoptysis, sputum production, shortness of breath and wheezing.   Cardiovascular:  Negative for chest pain, palpitations, orthopnea and leg swelling.  Gastrointestinal:  Negative for abdominal pain, blood in stool, constipation, diarrhea, heartburn, melena, nausea and vomiting.  Genitourinary:  Negative for dysuria, frequency and urgency.  Musculoskeletal:  Positive for back pain and joint pain.  Skin: Negative.  Negative for itching and rash.  Neurological:  Negative for dizziness, tingling, focal weakness, weakness and headaches.  Endo/Heme/Allergies:  Does not bruise/bleed  easily.  Psychiatric/Behavioral:  Negative for depression. The patient is not nervous/anxious and does not have insomnia.     MEDICAL HISTORY:  Past Medical History:  Diagnosis Date   Anemia    CAD (coronary artery disease)    Cataracts, bilateral    Closed compression fracture of first lumbar vertebra (HCC)    Closed compression fracture of second lumbar vertebra (HCC)    Diabetes mellitus without complication (HCC)    High cholesterol    History of pneumonia 2010   [place]   History of uterine fibroid    Hypercalcemia    Hyperlipidemia    Hypertension    Lytic bone lesions on xray    Multiple myeloma (HCC)    Osteoporosis    Primary osteoarthritis of right knee     SURGICAL HISTORY: Past Surgical History:  Procedure Laterality Date   CATARACT EXTRACTION Bilateral 2014   COLONOSCOPY  2011   cleared for 5 yrs- [facility]   ECTOPIC PREGNANCY SURGERY      SOCIAL HISTORY: Social History   Socioeconomic History   Marital status: Married    Spouse name: Not on file   Number of children: 2   Years of education: Not on file   Highest education level: 12th grade  Occupational History   Occupation: Retired  Tobacco Use   Smoking status: Never    Passive exposure: Never   Smokeless tobacco: Never   Tobacco comments:    smoking cessation materials not required  Vaping Use   Vaping status: Never Used  Substance and Sexual Activity   Alcohol use: No   Drug use: No   Sexual activity: Not Currently  Other Topics Concern   Not on file  Social History Narrative   [place] with husband; drives. Never smoked; no alcohol. Daughters live close.    Social Drivers of Corporate investment banker Strain: Low Risk  (11/19/2022)   Overall Financial Resource Strain (CARDIA)    Difficulty of Paying Living Expenses: Not very hard  Food Insecurity: No Food Insecurity (05/21/2023)   Hunger Vital Sign    Worried About Running Out of Food in the Last Year: Never true    Ran Out  of Food in the Last Year: Never true  Transportation Needs: No Transportation Needs (05/21/2023)   PRAPARE - Administrator, Civil Service (Medical): No    Lack of Transportation (Non-Medical): No  Physical Activity: Sufficiently Active (11/19/2022)   Exercise Vital Sign    Days of Exercise per Week: 5 days    Minutes of Exercise per Session: 30 min  Stress: No Stress Concern Present (11/19/2022)   [employer] of Occupational Health - Occupational Stress Questionnaire    Feeling of Stress : Not at all  Social Connections: Moderately Integrated (11/19/2022)   Social Connection and Isolation Panel    Frequency of Communication with Friends and Family: Twice a week    Frequency of Social Gatherings with Friends and Family: More than three times a week    Attends Religious Services: More than 4 times per year    Active Member of [employer] or Organizations: No    Attends Banker Meetings: Never    Marital Status: Married  Catering manager Violence: Not At Risk (05/21/2023)   Humiliation, Afraid, Rape, and Kick questionnaire    Fear of Current or Ex-Partner: No    Emotionally Abused:  No    Physically Abused: No    Sexually Abused: No    FAMILY HISTORY: Family History  Problem Relation Age of Onset   Cancer Mother        breast   Heart disease Mother    Diabetes Father    Heart disease Father    Heart attack Father    Heart attack Brother    Diabetes Brother     ALLERGIES:  is allergic to latex and penicillins.  MEDICATIONS:  Current Outpatient Medications  Medication Sig Dispense Refill   Accu-Chek Softclix Lancets lancets Use as instructed 100 each 0   acetaminophen  (TYLENOL ) 325 MG tablet Take 325 mg by mouth 2 (two) times daily. START 2 days prior to chemo infusion. Take For 2 days; Do NOT take on the day of infusion     acyclovir  (ZOVIRAX ) 400 MG tablet TAKE 1 TABLET BY MOUTH TWICE DAILY 60 tablet 10   aspirin  EC 81 MG tablet Take 81 mg by mouth daily.  Swallow whole.     BD PEN NEEDLE NANO 2ND GEN 32G X 4 MM MISC USE 1  ONCE DAILY 100 each 0   Blood Glucose Monitoring Suppl (ACCU-CHEK GUIDE) w/Device KIT USE AS DIRECTED TO CHECK GLUCOSE     Calcium  Carbonate-Vit D-Min (CALCIUM  1200 PO) Take 1 capsule by mouth daily.     carvedilol  (COREG ) 3.125 MG tablet TAKE 1 TABLET BY MOUTH TWICE DAILY 60 tablet 10   dexamethasone  (DECADRON ) 4 MG tablet Take 5 tablets (20 mg total) by mouth as directed. Take one hour before monthly Darzalex  injections. 15 tablet 3   diphenoxylate -atropine  (LOMOTIL ) 2.5-0.025 MG tablet TAKE 1 TABLET BY MOUTH FOUR TIMES DAILY AS NEEDED FOR DIARRHEA OR LOOSE STOOLS 60 tablet 3   furosemide  (LASIX ) 20 MG tablet Take 1 tablet (20 mg total) by mouth every morning. 30 tablet 3   Glucerna (GLUCERNA) LIQD Take 237 mLs by mouth.     glucose blood (ACCU-CHEK GUIDE) test strip Use as instructed 100 each 0   hydrOXYzine  (ATARAX ) 10 MG tablet Take 1 tablet (10 mg total) by mouth at bedtime as needed. 90 tablet 1   insulin  glargine (LANTUS  SOLOSTAR) 100 UNIT/ML Solostar Pen Inject 10 Units into the skin daily. 6 units every day except Wed and Thursday- 8 units 15 mL 0   lenalidomide  (REVLIMID ) 10 MG capsule Take 1 capsule (10 mg total) by mouth daily. I capsule daily for 21 days then 7 days off. [employer] Auth # [id number]  Date Obtained 07/28/2023 21 capsule 0   losartan  (COZAAR ) 25 MG tablet Take 1 tablet (25 mg total) by mouth every evening. 90 tablet 1   lovastatin  (MEVACOR ) 40 MG tablet Take 1 tablet (40 mg total) by mouth every evening. 90 tablet 1   metFORMIN  (GLUCOPHAGE ) 1000 MG tablet Take 1 tablet (1,000 mg total) by mouth 2 (two) times daily. 180 tablet 1   montelukast  (SINGULAIR ) 10 MG tablet STARTING 2 DAYS BEFORE INFUSION, TAKE 1 TABLET BY MOUTH ONCE DAILY *DO NOT TAKE ON DAY OF INFUSION* 20 tablet 10   ondansetron  (ZOFRAN ) 8 MG tablet One pill every 8 hours as needed for nausea/vomitting. 40 tablet 1   pantoprazole  (PROTONIX ) 40 MG  tablet Take 1 tablet (40 mg total) by mouth daily. 90 tablet 1   potassium chloride  SA (KLOR-CON  M) 20 MEQ tablet 1 pill twice a day 60 tablet 3   prochlorperazine  (COMPAZINE ) 10 MG tablet TAKE 1 TABLET BY MOUTH EVERY 6 HOURS  AS NEEDED FOR NAUSEA FOR VOMITING 40 tablet 0   sertraline  (ZOLOFT ) 25 MG tablet Take 1 tablet (25 mg total) by mouth daily. 90 tablet 1   triamcinolone  (NASACORT ) 55 MCG/ACT AERO nasal inhaler 2 sprays 2 (two) times daily.     No current facility-administered medications for this visit.   Facility-Administered Medications Ordered in Other Visits  Medication Dose Route Frequency Provider Last Rate Last Admin   acetaminophen  (TYLENOL ) tablet 650 mg  650 mg Oral Once Niyla Marone R, MD       daratumumab -hyaluronidase -fihj (DARZALEX  FASPRO) 1800-30000 MG-UT/15ML chemo SQ injection 1,800 mg  1,800 mg Subcutaneous Once Keeshawn Fakhouri R, MD       diphenhydrAMINE  (BENADRYL ) capsule 50 mg  50 mg Oral Once Stevi Hollinshead R, MD        PHYSICAL EXAMINATION:  Vitals:   09/25/23 0821 09/25/23 0842  BP: (!) 147/60 (!) 151/53  Pulse: 69   Resp: 16   Temp: 97.8 F (36.6 C)   SpO2: 99%        Filed Weights   09/25/23 0821  Weight: 135 lb (61.2 kg)        Physical Exam Vitals and nursing note reviewed.  HENT:     Head: Normocephalic and atraumatic.     Mouth/Throat:     Pharynx: Oropharynx is clear.  Eyes:     Extraocular Movements: Extraocular movements intact.     Pupils: Pupils are equal, round, and reactive to light.  Cardiovascular:     Rate and Rhythm: Normal rate and regular rhythm.  Pulmonary:     Comments: Decreased breath sounds bilaterally.  Abdominal:     Palpations: Abdomen is soft.  Musculoskeletal:        General: Normal range of motion.     Cervical back: Normal range of motion.  Skin:    General: Skin is warm.  Neurological:     General: No focal deficit present.     Mental Status: She is alert and oriented to person,  place, and time.  Psychiatric:        Behavior: Behavior normal.        Judgment: Judgment normal.      LABORATORY DATA:  I have reviewed the data as listed Lab Results  Component Value Date   WBC 3.5 (L) 09/25/2023   HGB 10.5 (L) 09/25/2023   HCT 32.7 (L) 09/25/2023   MCV 101.2 (H) 09/25/2023   PLT 159 09/25/2023   Recent Labs    07/03/23 0854 07/31/23 0836 08/28/23 0923 09/25/23 0818  NA 137 136 139 135  K 3.5 3.6 3.6 3.5  CL 105 106 110 106  CO2 23 22 19* 23  GLUCOSE 163* 151* 170* 194*  BUN 14 17 17 12   CREATININE 1.05* 1.11* 0.95 0.99  CALCIUM  8.5* 8.8* 9.2 8.5*  GFRNONAA 51* 48* 58* 55*  PROT 6.3* 6.4*  --  6.2*  ALBUMIN 3.9 3.9  --  3.8  AST 18 17  --  22  ALT 13 14  --  16  ALKPHOS 51 49  --  52  BILITOT 0.8 0.7  --  0.6    RADIOGRAPHIC STUDIES: I have personally reviewed the radiological images as listed and agreed with the findings in the report. No results found.  Multiple myeloma not having achieved remission (HCC) # STAGE I- MULTIPLE MYELOMA -Active [bone lesions; hypercalcemia; anemia; No renal insuffiencey;Bone marrow-32% plasma cell-   STANDARD Cytogenetics].  Currently on Revlimid -Dex- Dara SQ. Partial response  noted; JUNE  2025 M protein = NEG;  K/L RATIO= 1. 6= WNL- stable.   # Proceed with Dara SQ today + DEX 20 mg PO [at home/pre-meds] Labs today reviewed;  ALSO  Revlimid  at 10 mg dose 3 weeks on 1 week off- as diarrhea is improved.  Stable.  # Hypokalemia severe-DEC K.2.6 - improved today 3.5- -continue Kdur once a day-  Stable.  # Achy joints/muscle- trial of magnesium  OTC.   #Reflux-like symptoms-  Secondary to dexamethasone ; on PPI- continue low-dose 4 mg weekly/WED.  and wants to hold off GI evaluation [per daughter]- stable.     # Anemia-moderate- multifactorial-MM/ IDA-  continue trial of gentle iron . Hb [JAn 2024]- 10-11- FEB  iron  sat-15stable.  # Mild swelling in leg/ likely dependant/compression- no concerns of DVT- stable. on  lasix  20 mg/day-stable.  # Diabetes-on oral hypoglycemic agents; on long acting insulin ; 14 units of lantus  on T/W/Thursday- Defer to PCP-stable.  # labile BP- recent hypotension- but in general HTN: continue losartan ; continue coreg ; BP- 150-160- refilled coreg ; monitor for now.stable.   # Hypercalcemia secondary to multiple myeloma- [FEB 2023-Zometa  ; calcitonin]-Zometa  q 67M- stable.  # Left shoulder pain/arm pain-likely arthritic in nature okay to continue as needed Tylenol .  No concern for any progression of multiple myeloma.  If worse would consider x-rays.  # Weight loss-s/p re-evaluation with Joli -stable.    #DVT/shingles prophylaxis: Aspirin /acyclovirr- stable.    # Vaccinations; Ok with flu/COVID shot  # IV access:PIV   # ACP-   * Cristina- [phone] zometa -q 3 months [08/28/2023]- ; MM; K/L q 4 W  PS # DISPOSITION:  # Dara SQ ; zometa   # follow up in 4  weeks/Friday  MD: labs- cbc/cmp;BNP; MM panel;K/L light chains; Dara SQ;  -Dr.B       All questions were answered. The patient knows to call the clinic with any problems, questions or concerns.    Cindy JONELLE Joe, MD 09/25/2023 9:22 AM

## 2023-09-25 NOTE — Patient Instructions (Signed)
 [facility] - A DEPT OF [facility]  Discharge Instructions: Thank you for choosing [facility] to provide your oncology and hematology care.  If you have a lab appointment with the Cancer Center, please go directly to the Cancer Center and check in at the registration area.  Wear comfortable clothing and clothing appropriate for easy access to any Portacath or PICC line.   We strive to give you quality time with your provider. You may need to reschedule your appointment if you arrive late (15 or more minutes).  Arriving late affects you and other patients whose appointments are after yours.  Also, if you miss three or more appointments without notifying the office, you may be dismissed from the clinic at the provider's discretion.      For prescription refill requests, have your pharmacy contact our office and allow 72 hours for refills to be completed.    Today you received the following chemotherapy and/or immunotherapy agents Darazalex.      To help prevent nausea and vomiting after your treatment, we encourage you to take your nausea medication as directed.  BELOW ARE SYMPTOMS THAT SHOULD BE REPORTED IMMEDIATELY: *FEVER GREATER THAN 100.4 F (38 C) OR HIGHER *CHILLS OR SWEATING *NAUSEA AND VOMITING THAT IS NOT CONTROLLED WITH YOUR NAUSEA MEDICATION *UNUSUAL SHORTNESS OF BREATH *UNUSUAL BRUISING OR BLEEDING *URINARY PROBLEMS (pain or burning when urinating, or frequent urination) *BOWEL PROBLEMS (unusual diarrhea, constipation, pain near the anus) TENDERNESS IN MOUTH AND THROAT WITH OR WITHOUT PRESENCE OF ULCERS (sore throat, sores in mouth, or a toothache) UNUSUAL RASH, SWELLING OR PAIN  UNUSUAL VAGINAL DISCHARGE OR ITCHING   Items with * indicate a potential emergency and should be followed up as soon as possible or go to the Emergency Department if any problems should occur.  Please show the CHEMOTHERAPY ALERT CARD or IMMUNOTHERAPY  ALERT CARD at check-in to the Emergency Department and triage nurse.  Should you have questions after your visit or need to cancel or reschedule your appointment, please contact [facility] - A DEPT OF [facility]  [phone] and follow the prompts.  Office hours are 8:00 a.m. to 4:30 p.m. Monday - Friday. Please note that voicemails left after 4:00 p.m. may not be returned until the following business day.  We are closed weekends and major holidays. You have access to a nurse at all times for urgent questions. Please call the main number to the clinic [phone] and follow the prompts.  For any non-urgent questions, you may also contact your provider using MyChart. We now offer e-Visits for anyone 67 and older to request care online for non-urgent symptoms. For details visit [email or web address].   Also download the MyChart app! Go to the app store, search "MyChart", open the app, select [facility], and log in with your MyChart username and password.

## 2023-09-28 LAB — KAPPA/LAMBDA LIGHT CHAINS
Kappa free light chain: 16.9 mg/L (ref 3.3–19.4)
Kappa, lambda light chain ratio: 1.24 (ref 0.26–1.65)
Lambda free light chains: 13.6 mg/L (ref 5.7–26.3)

## 2023-09-29 LAB — MULTIPLE MYELOMA PANEL, SERUM
Albumin SerPl Elph-Mcnc: 3.5 g/dL (ref 2.9–4.4)
Albumin/Glob SerPl: 1.9 — ABNORMAL HIGH (ref 0.7–1.7)
Alpha 1: 0.1 g/dL (ref 0.0–0.4)
Alpha2 Glob SerPl Elph-Mcnc: 0.6 g/dL (ref 0.4–1.0)
B-Globulin SerPl Elph-Mcnc: 0.7 g/dL (ref 0.7–1.3)
Gamma Glob SerPl Elph-Mcnc: 0.5 g/dL (ref 0.4–1.8)
Globulin, Total: 1.9 g/dL — ABNORMAL LOW (ref 2.2–3.9)
IgA: 80 mg/dL (ref 64–422)
IgG (Immunoglobin G), Serum: 567 mg/dL — ABNORMAL LOW (ref 586–1602)
IgM (Immunoglobulin M), Srm: 17 mg/dL — ABNORMAL LOW (ref 26–217)
Total Protein ELP: 5.4 g/dL — ABNORMAL LOW (ref 6.0–8.5)

## 2023-09-30 ENCOUNTER — Other Ambulatory Visit: Payer: Self-pay | Admitting: Internal Medicine

## 2023-09-30 DIAGNOSIS — F329 Major depressive disorder, single episode, unspecified: Secondary | ICD-10-CM

## 2023-09-30 DIAGNOSIS — C9000 Multiple myeloma not having achieved remission: Secondary | ICD-10-CM

## 2023-09-30 DIAGNOSIS — F5102 Adjustment insomnia: Secondary | ICD-10-CM

## 2023-10-01 ENCOUNTER — Encounter: Payer: Self-pay | Admitting: Internal Medicine

## 2023-10-01 ENCOUNTER — Telehealth: Payer: Self-pay

## 2023-10-01 NOTE — Telephone Encounter (Signed)
 Responding to a fax for [employer] auth on pts revlimide that was submitted for refill 09/21/23. Faxed request back to [phone] [employer].  Today's Date: October 01, 2023 Patient Name: Cristina Morgan Authorization Number: [id number] This Authorization is valid for: 30 days Authorization Status: Active Remember to have your patient take her survey every six months. Her last survey was on May 11, 2023 and her next survey will be available on October 19, 2023.

## 2023-10-14 ENCOUNTER — Other Ambulatory Visit: Payer: Self-pay | Admitting: *Deleted

## 2023-10-14 DIAGNOSIS — C9000 Multiple myeloma not having achieved remission: Secondary | ICD-10-CM

## 2023-10-16 ENCOUNTER — Other Ambulatory Visit: Payer: Self-pay | Admitting: Internal Medicine

## 2023-10-16 DIAGNOSIS — C9000 Multiple myeloma not having achieved remission: Secondary | ICD-10-CM

## 2023-10-18 ENCOUNTER — Encounter: Payer: Self-pay | Admitting: Internal Medicine

## 2023-10-19 ENCOUNTER — Telehealth: Payer: Self-pay | Admitting: *Deleted

## 2023-10-19 ENCOUNTER — Other Ambulatory Visit: Payer: Self-pay | Admitting: Internal Medicine

## 2023-10-19 NOTE — Telephone Encounter (Signed)
 They want a refill for the patient's lovastatin  and that is been given to the PCP and I gave the pharmacy talking to Morene and told him to seek out his PCP which is Cathryne Molt.  He will try

## 2023-10-23 ENCOUNTER — Encounter: Payer: Self-pay | Admitting: Internal Medicine

## 2023-10-23 ENCOUNTER — Inpatient Hospital Stay

## 2023-10-23 ENCOUNTER — Encounter: Payer: Self-pay | Admitting: Nurse Practitioner

## 2023-10-23 ENCOUNTER — Inpatient Hospital Stay: Attending: Nurse Practitioner

## 2023-10-23 ENCOUNTER — Inpatient Hospital Stay: Admitting: Nurse Practitioner

## 2023-10-23 VITALS — BP 151/58 | HR 67 | Temp 97.5°F | Resp 16 | Wt 136.0 lb

## 2023-10-23 DIAGNOSIS — G9341 Metabolic encephalopathy: Secondary | ICD-10-CM

## 2023-10-23 DIAGNOSIS — Z5181 Encounter for therapeutic drug level monitoring: Secondary | ICD-10-CM | POA: Diagnosis not present

## 2023-10-23 DIAGNOSIS — R600 Localized edema: Secondary | ICD-10-CM | POA: Insufficient documentation

## 2023-10-23 DIAGNOSIS — I509 Heart failure, unspecified: Secondary | ICD-10-CM | POA: Diagnosis not present

## 2023-10-23 DIAGNOSIS — C9000 Multiple myeloma not having achieved remission: Secondary | ICD-10-CM

## 2023-10-23 DIAGNOSIS — Z7961 Long term (current) use of immunomodulator: Secondary | ICD-10-CM | POA: Insufficient documentation

## 2023-10-23 DIAGNOSIS — Z794 Long term (current) use of insulin: Secondary | ICD-10-CM | POA: Diagnosis not present

## 2023-10-23 DIAGNOSIS — Z79899 Other long term (current) drug therapy: Secondary | ICD-10-CM | POA: Diagnosis not present

## 2023-10-23 DIAGNOSIS — Z7982 Long term (current) use of aspirin: Secondary | ICD-10-CM | POA: Diagnosis not present

## 2023-10-23 DIAGNOSIS — Z5112 Encounter for antineoplastic immunotherapy: Secondary | ICD-10-CM | POA: Insufficient documentation

## 2023-10-23 DIAGNOSIS — E876 Hypokalemia: Secondary | ICD-10-CM | POA: Insufficient documentation

## 2023-10-23 DIAGNOSIS — E119 Type 2 diabetes mellitus without complications: Secondary | ICD-10-CM | POA: Insufficient documentation

## 2023-10-23 DIAGNOSIS — D509 Iron deficiency anemia, unspecified: Secondary | ICD-10-CM | POA: Diagnosis not present

## 2023-10-23 DIAGNOSIS — R197 Diarrhea, unspecified: Secondary | ICD-10-CM | POA: Diagnosis not present

## 2023-10-23 DIAGNOSIS — I10 Essential (primary) hypertension: Secondary | ICD-10-CM | POA: Insufficient documentation

## 2023-10-23 LAB — CMP (CANCER CENTER ONLY)
ALT: 14 U/L (ref 0–44)
AST: 19 U/L (ref 15–41)
Albumin: 3.6 g/dL (ref 3.5–5.0)
Alkaline Phosphatase: 50 U/L (ref 38–126)
Anion gap: 8 (ref 5–15)
BUN: 18 mg/dL (ref 8–23)
CO2: 25 mmol/L (ref 22–32)
Calcium: 9.2 mg/dL (ref 8.9–10.3)
Chloride: 104 mmol/L (ref 98–111)
Creatinine: 0.97 mg/dL (ref 0.44–1.00)
GFR, Estimated: 57 mL/min — ABNORMAL LOW (ref >60)
Glucose, Bld: 173 mg/dL — ABNORMAL HIGH (ref 70–99)
Potassium: 3.1 mmol/L — ABNORMAL LOW (ref 3.5–5.1)
Sodium: 137 mmol/L (ref 135–145)
Total Bilirubin: 0.6 mg/dL (ref 0.0–1.2)
Total Protein: 6.1 g/dL — ABNORMAL LOW (ref 6.5–8.1)

## 2023-10-23 LAB — CBC WITH DIFFERENTIAL/PLATELET
Abs Immature Granulocytes: 0.01 K/uL (ref 0.00–0.07)
Basophils Absolute: 0 K/uL (ref 0.0–0.1)
Basophils Relative: 1 %
Eosinophils Absolute: 0.1 K/uL (ref 0.0–0.5)
Eosinophils Relative: 3 %
HCT: 31.3 % — ABNORMAL LOW (ref 36.0–46.0)
Hemoglobin: 10.1 g/dL — ABNORMAL LOW (ref 12.0–15.0)
Immature Granulocytes: 0 %
Lymphocytes Relative: 37 %
Lymphs Abs: 1.6 K/uL (ref 0.7–4.0)
MCH: 32.6 pg (ref 26.0–34.0)
MCHC: 32.3 g/dL (ref 30.0–36.0)
MCV: 101 fL — ABNORMAL HIGH (ref 80.0–100.0)
Monocytes Absolute: 0.7 K/uL (ref 0.1–1.0)
Monocytes Relative: 16 %
Neutro Abs: 1.9 K/uL (ref 1.7–7.7)
Neutrophils Relative %: 43 %
Platelets: 134 K/uL — ABNORMAL LOW (ref 150–400)
RBC: 3.1 MIL/uL — ABNORMAL LOW (ref 3.87–5.11)
RDW: 14.4 % (ref 11.5–15.5)
WBC: 4.3 K/uL (ref 4.0–10.5)
nRBC: 0 % (ref 0.0–0.2)

## 2023-10-23 LAB — BRAIN NATRIURETIC PEPTIDE: B Natriuretic Peptide: 102.4 pg/mL — ABNORMAL HIGH (ref 0.0–100.0)

## 2023-10-23 LAB — MAGNESIUM: Magnesium: 1.4 mg/dL — ABNORMAL LOW (ref 1.7–2.4)

## 2023-10-23 MED ORDER — DARATUMUMAB-HYALURONIDASE-FIHJ 1800-30000 MG-UT/15ML ~~LOC~~ SOLN
1800.0000 mg | Freq: Once | SUBCUTANEOUS | Status: AC
  Administered 2023-10-23: 1800 mg via SUBCUTANEOUS
  Filled 2023-10-23: qty 15

## 2023-10-23 MED ORDER — DIPHENHYDRAMINE HCL 25 MG PO CAPS
50.0000 mg | ORAL_CAPSULE | Freq: Once | ORAL | Status: AC
  Administered 2023-10-23: 50 mg via ORAL
  Filled 2023-10-23: qty 2

## 2023-10-23 MED ORDER — ACETAMINOPHEN 325 MG PO TABS
650.0000 mg | ORAL_TABLET | Freq: Once | ORAL | Status: AC
  Administered 2023-10-23: 650 mg via ORAL
  Filled 2023-10-23: qty 2

## 2023-10-23 MED ORDER — POTASSIUM CHLORIDE CRYS ER 20 MEQ PO TBCR: 20.0000 meq | EXTENDED_RELEASE_TABLET | Freq: Every day | ORAL | 1 refills | Status: DC

## 2023-10-23 MED ORDER — DEXAMETHASONE 4 MG PO TABS
20.0000 mg | ORAL_TABLET | Freq: Once | ORAL | Status: AC
  Administered 2023-10-23: 20 mg via ORAL
  Filled 2023-10-23: qty 5

## 2023-10-23 MED ORDER — DEXAMETHASONE 4 MG PO TABS: 20.0000 mg | ORAL_TABLET | ORAL | 3 refills | Status: DC

## 2023-10-23 NOTE — Addendum Note (Signed)
 Addended by: BETHENE ALVIN RAMAN on: 10/23/2023 10:15 AM   Modules accepted: Orders

## 2023-10-23 NOTE — Progress Notes (Signed)
 [facility] CONSULT NOTE  Patient Care Team: Joshua Cathryne BROCKS, MD (Inactive) as PCP - General (Family Medicine) Arloa Mliss RAMAN, [facility] (Inactive) (Pharmacist) Rennie Cindy SAUNDERS, MD as Consulting Physician (Oncology)  CHIEF COMPLAINTS/PURPOSE OF CONSULTATION: Multiple myeloma  Oncology History Overview Note  # MULTIPLE MYELOMA  [FEB 2023-hypercalcemia status changes]-Active [bone lesions; hypercalcemia; anemia; No renal insuffiencey;Bone marrow-32% plasma cell-   STANDARD Cytogenetics].FEB 2023- S/p dexamethasone  20 mg q day x4.   # REV [15 mg -3 week-On and 1 week-OFF]-DARA; May 2023-discontinued rev 15 diarrhea/hypotension  # MAY 2023- dara-Rev 10 mg 3w/1w   # FEB 2023-hypercalcemia [[facility]-status post calcitonin; bisphosphonate]  BONE MARROW, ASPIRATE, CLOT, CORE:  -Hypercellular bone marrow with plasma cell neoplasm  -See comment   PERIPHERAL BLOOD:  -Normocytic-normochromic anemia   COMMENT:   The bone marrow is hypercellular for age with increased number of  atypical plasma cells representing 32% of all cells in the aspirate  associated with interstitial infiltrates and numerous variably sized  clusters in the clot/biopsy sections.  The plasma cells display weak  kappa light chain restriction consistent with plasma cell neoplasm.  Correlation with cytogenetic and FISH studies is recommended   MICROSCOPIC DESCRIPTION:   PERIPHERAL BLOOD SMEAR: The red blood cells display mild  anisopoikilocytosis with mild polychromasia.  The white blood cells are  normal number with scattered hypogranular neutrophils. An occasional  myelocyte and large atypical mononuclear cells are seen on scan. The  platelets are normal in number.    Multiple myeloma not having achieved remission (HCC)  05/15/2021 Initial Diagnosis   Multiple myeloma not having achieved remission (HCC)   06/27/2021 - 10/17/2021 Chemotherapy   Patient is on Treatment Plan : MYELOMA Daratumumab  SQ +  Lenalidomide  + Dexamethasone  (DaraRd) q28d     06/27/2021 -  Chemotherapy   Patient is on Treatment Plan : MYELOMA RELAPSED REFRACTORY Daratumumab  SQ + Lenalidomide  + Dexamethasone  (DaraRd) q28d     07/11/2021 Cancer Staging   Staging form: Plasma Cell Myeloma and Plasma Cell Disorders, AJCC 8th Edition - Clinical: Beta-2-microglobulin (mg/L): 2.6, Albumin (g/dL): 3.9, ISS: Stage I - Signed by Rennie Cindy SAUNDERS, MD on 07/11/2021 Stage prefix: Initial diagnosis Beta 2 microglobulin range (mg/L): Less than 3.5 Albumin range (g/dL): Greater than or equal to 3.5    HISTORY OF PRESENTING ILLNESS: pt in a wheel chair; accompanied by her daughter.   Cristina Morgan 89 y.o.  female with  multiple myeloma currently on Dara-Rev-Dex is proceed with Dara SQ today.    Patient  denies any nausea vomiting or worsening diarrhea.  Patient has chronic mild diarrhea on revlimid  which is controlled with immodium for diarrhea. No nausea no vomiting.      Admits to mild achy joints/myalgias.   Appetite is good. Gained weight. Denies any blood in stools or black-colored stools. She needs refill of dex and potassium.   Review of Systems  Constitutional:  Positive for malaise/fatigue and weight loss. Negative for chills, diaphoresis and fever.  HENT:  Negative for nosebleeds and sore throat.   Eyes:  Negative for double vision.  Respiratory:  Negative for cough, hemoptysis, sputum production, shortness of breath and wheezing.   Cardiovascular:  Negative for chest pain, palpitations, orthopnea and leg swelling.  Gastrointestinal:  Negative for abdominal pain, blood in stool, constipation, diarrhea, heartburn, melena, nausea and vomiting.  Genitourinary:  Negative for dysuria, frequency and urgency.  Musculoskeletal:  Positive for back pain and joint pain.  Skin: Negative.  Negative for itching  and rash.  Neurological:  Negative for dizziness, tingling, focal weakness, weakness and headaches.   Endo/Heme/Allergies:  Does not bruise/bleed easily.  Psychiatric/Behavioral:  Negative for depression. The patient is not nervous/anxious and does not have insomnia.     MEDICAL HISTORY:  Past Medical History:  Diagnosis Date   Anemia    CAD (coronary artery disease)    Cataracts, bilateral    Closed compression fracture of first lumbar vertebra (HCC)    Closed compression fracture of second lumbar vertebra (HCC)    Diabetes mellitus without complication (HCC)    High cholesterol    History of pneumonia 2010   [place]   History of uterine fibroid    Hypercalcemia    Hyperlipidemia    Hypertension    Lytic bone lesions on xray    Multiple myeloma (HCC)    Osteoporosis    Primary osteoarthritis of right knee     SURGICAL HISTORY: Past Surgical History:  Procedure Laterality Date   CATARACT EXTRACTION Bilateral 2014   COLONOSCOPY  2011   cleared for 5 yrs- [facility]   ECTOPIC PREGNANCY SURGERY      SOCIAL HISTORY: Social History   Socioeconomic History   Marital status: Married    Spouse name: Not on file   Number of children: 2   Years of education: Not on file   Highest education level: 12th grade  Occupational History   Occupation: Retired  Tobacco Use   Smoking status: Never    Passive exposure: Never   Smokeless tobacco: Never   Tobacco comments:    smoking cessation materials not required  Vaping Use   Vaping status: Never Used  Substance and Sexual Activity   Alcohol use: No   Drug use: No   Sexual activity: Not Currently  Other Topics Concern   Not on file  Social History Narrative   [place] with husband; drives. Never smoked; no alcohol. Daughters live close.    Social Drivers of Corporate investment banker Strain: Low Risk  (11/19/2022)   Overall Financial Resource Strain (CARDIA)    Difficulty of Paying Living Expenses: Not very hard  Food Insecurity: No Food Insecurity (05/21/2023)   Hunger Vital Sign    Worried About Running Out of  Food in the Last Year: Never true    Ran Out of Food in the Last Year: Never true  Transportation Needs: No Transportation Needs (05/21/2023)   PRAPARE - Administrator, Civil Service (Medical): No    Lack of Transportation (Non-Medical): No  Physical Activity: Sufficiently Active (11/19/2022)   Exercise Vital Sign    Days of Exercise per Week: 5 days    Minutes of Exercise per Session: 30 min  Stress: No Stress Concern Present (11/19/2022)   [employer] of Occupational Health - Occupational Stress Questionnaire    Feeling of Stress : Not at all  Social Connections: Moderately Integrated (11/19/2022)   Social Connection and Isolation Panel    Frequency of Communication with Friends and Family: Twice a week    Frequency of Social Gatherings with Friends and Family: More than three times a week    Attends Religious Services: More than 4 times per year    Active Member of [employer] or Organizations: No    Attends Banker Meetings: Never    Marital Status: Married  Catering manager Violence: Not At Risk (05/21/2023)   Humiliation, Afraid, Rape, and Kick questionnaire    Fear of Current or Ex-Partner: No  Emotionally Abused: No    Physically Abused: No    Sexually Abused: No    FAMILY HISTORY: Family History  Problem Relation Age of Onset   Cancer Mother        breast   Heart disease Mother    Diabetes Father    Heart disease Father    Heart attack Father    Heart attack Brother    Diabetes Brother     ALLERGIES:  is allergic to latex and penicillins.  MEDICATIONS:  Current Outpatient Medications  Medication Sig Dispense Refill   Accu-Chek Softclix Lancets lancets Use as instructed 100 each 0   acetaminophen  (TYLENOL ) 325 MG tablet Take 325 mg by mouth 2 (two) times daily. START 2 days prior to chemo infusion. Take For 2 days; Do NOT take on the day of infusion     acyclovir  (ZOVIRAX ) 400 MG tablet TAKE ONE (1) TABLET BY MOUTH TWICE DAILY 60 tablet 11    aspirin  EC 81 MG tablet Take 81 mg by mouth daily. Swallow whole.     BD PEN NEEDLE NANO 2ND GEN 32G X 4 MM MISC USE 1  ONCE DAILY 100 each 0   Blood Glucose Monitoring Suppl (ACCU-CHEK GUIDE) w/Device KIT USE AS DIRECTED TO CHECK GLUCOSE     Calcium  Carbonate-Vit D-Min (CALCIUM  1200 PO) Take 1 capsule by mouth daily.     carvedilol  (COREG ) 3.125 MG tablet TAKE 1 TABLET BY MOUTH TWICE DAILY 60 tablet 10   dexamethasone  (DECADRON ) 4 MG tablet Take 5 tablets (20 mg total) by mouth as directed. Take one hour before monthly Darzalex  injections. 15 tablet 3   diphenoxylate -atropine  (LOMOTIL ) 2.5-0.025 MG tablet TAKE 1 TABLET BY MOUTH FOUR TIMES DAILY AS NEEDED FOR DIARRHEA OR LOOSE STOOLS 60 tablet 3   furosemide  (LASIX ) 20 MG tablet Take 1 tablet (20 mg total) by mouth every morning. 30 tablet 3   Glucerna (GLUCERNA) LIQD Take 237 mLs by mouth.     glucose blood (ACCU-CHEK GUIDE) test strip Use as instructed 100 each 0   hydrOXYzine  (ATARAX ) 10 MG tablet Take 1 tablet (10 mg total) by mouth at bedtime as needed. 90 tablet 1   insulin  glargine (LANTUS  SOLOSTAR) 100 UNIT/ML Solostar Pen Inject 10 Units into the skin daily. 6 units every day except Wed and Thursday- 8 units 15 mL 0   KLOR-CON  M20 20 MEQ tablet Take 1 tablet by mouth twice daily 60 tablet 0   lenalidomide  (REVLIMID ) 10 MG capsule Take 1 capsule (10 mg total) by mouth daily. I capsule daily for 21 days then 7 days off. [employer] Auth # [id number]  Date Obtained 07/28/2023 21 capsule 0   losartan  (COZAAR ) 25 MG tablet Take 1 tablet (25 mg total) by mouth every evening. 90 tablet 1   lovastatin  (MEVACOR ) 40 MG tablet Take 1 tablet (40 mg total) by mouth every evening. 90 tablet 1   metFORMIN  (GLUCOPHAGE ) 1000 MG tablet Take 1 tablet (1,000 mg total) by mouth 2 (two) times daily. 180 tablet 1   montelukast  (SINGULAIR ) 10 MG tablet STARTING 2 DAYS BEFORE INFUSION, TAKE 1 TABLET BY MOUTH ONCE DAILY *DO NOT TAKE ON DAY OF INFUSION* 20 tablet 11    ondansetron  (ZOFRAN ) 8 MG tablet One pill every 8 hours as needed for nausea/vomitting. 40 tablet 1   pantoprazole  (PROTONIX ) 40 MG tablet Take 1 tablet (40 mg total) by mouth daily. 90 tablet 1   prochlorperazine  (COMPAZINE ) 10 MG tablet TAKE 1 TABLET BY MOUTH EVERY  6 HOURS AS NEEDED FOR NAUSEA FOR VOMITING 40 tablet 0   sertraline  (ZOLOFT ) 25 MG tablet TAKE 1 TABLET BY MOUTH DAILY 90 tablet 11   triamcinolone  (NASACORT ) 55 MCG/ACT AERO nasal inhaler 2 sprays 2 (two) times daily.     No current facility-administered medications for this visit.    PHYSICAL EXAMINATION: Vitals:   10/23/23 0914  BP: (!) 151/58  Pulse: 67  Resp: 16  Temp: (!) 97.5 F (36.4 C)  SpO2: 100%   Filed Weights   10/23/23 0914  Weight: 136 lb (61.7 kg)   Physical Exam Vitals and nursing note reviewed.  HENT:     Head: Normocephalic and atraumatic.     Mouth/Throat:     Pharynx: Oropharynx is clear.  Eyes:     Extraocular Movements: Extraocular movements intact.     Pupils: Pupils are equal, round, and reactive to light.  Cardiovascular:     Rate and Rhythm: Normal rate and regular rhythm.  Pulmonary:     Comments: Decreased breath sounds bilaterally.  Abdominal:     Palpations: Abdomen is soft.  Musculoskeletal:        General: Normal range of motion.     Cervical back: Normal range of motion.  Skin:    General: Skin is warm.  Neurological:     General: No focal deficit present.     Mental Status: She is alert and oriented to person, place, and time.  Psychiatric:        Behavior: Behavior normal.        Judgment: Judgment normal.    LABORATORY DATA:  I have reviewed the data as listed Lab Results  Component Value Date   WBC 4.3 10/23/2023   HGB 10.1 (L) 10/23/2023   HCT 31.3 (L) 10/23/2023   MCV 101.0 (H) 10/23/2023   PLT 134 (L) 10/23/2023   Recent Labs    07/31/23 0836 08/28/23 0923 09/25/23 0818 10/23/23 0853  NA 136 139 135 137  K 3.6 3.6 3.5 3.1*  CL 106 110 106 104   CO2 22 19* 23 25  GLUCOSE 151* 170* 194* 173*  BUN 17 17 12 18   CREATININE 1.11* 0.95 0.99 0.97  CALCIUM  8.8* 9.2 8.5* 9.2  GFRNONAA 48* 58* 55* 57*  PROT 6.4*  --  6.2* 6.1*  ALBUMIN 3.9  --  3.8 3.6  AST 17  --  22 19  ALT 14  --  16 14  ALKPHOS 49  --  52 50  BILITOT 0.7  --  0.6 0.6    RADIOGRAPHIC STUDIES: I have personally reviewed the radiological images as listed and agreed with the findings in the report. No results found.   Assessment & Plan:  Multiple myeloma not having achieved remission  # STAGE I- MULTIPLE MYELOMA -Active [bone lesions; hypercalcemia; anemia; No renal insuffiencey;Bone marrow-32% plasma cell-   STANDARD Cytogenetics].  Currently on Revlimid -Dex- Dara SQ. Partial response noted; JUNE  2025 M protein = NEG;  K/L RATIO= 1. 6= WNL- stable.    # Labs today reviewed and acceptable for continuation of treatment.  She will continue Revlimid  10 mg 3 weeks on, 1 week off.  Proceed with Dara SQ today plus Dex 20 mg p.o. She will receive dex in clinic today and I provided refill.   # Hypokalemia severe-DEC K.2.6 - improved today 3.1- -continue Kdur once a day- Refilled.   # Achy joints/muscle- trial of magnesium  OTC.    # Reflux- like symptoms-  Secondary to  dexamethasone ; on PPI- continue low-dose 4 mg weekly/WED.  and wants to hold off GI evaluation [per daughter]- stable.      # Anemia-moderate- multifactorial-MM/ IDA-  continue trial of gentle iron . Hb [JAn 2024]- 10-11- FEB  iron  sat-15stable.   # Mild swelling in leg/ likely dependant/compression- no concerns of DVT- stable. on lasix  20 mg/day-stable.   # Diabetes-on oral hypoglycemic agents; on long acting insulin ; 14 units of lantus  on T/W/Thursday- Defer to PCP-stable.   # labile BP- recent hypotension- but in general HTN: continue losartan ; continue coreg ; BP- 150-160- refilled coreg ; monitor for now.stable.    # Hypercalcemia secondary to multiple myeloma- [FEB 2023-Zometa  ; calcitonin]-Zometa  q  43M- stable.   # Left shoulder pain/arm pain-likely arthritic in nature okay to continue as needed Tylenol .  No concern for any progression of multiple myeloma.  If worse would consider x-rays.   # Weight loss-s/p re-evaluation with Joli -stable.     # DVT/shingles prophylaxis: Aspirin /acyclovirr- stable.     # Vaccinations; Ok with flu/COVID shot   # IV access:PIV    # ACP-    * Cristina Morgan- [phone] zometa - q 3 months [08/28/2023]- MM; K/L q4w   PS  # DISPOSITION:  # Dara SQ # 4 weeks/Friday labs (cbc, cmp, MM panel, K/L LC, BNP), Dr Rennie, dara sq, zometa - la   No problem-specific Assessment & Plan notes found for this encounter.   All questions were answered. The patient knows to call the clinic with any problems, questions or concerns.    Tinnie KANDICE Dawn, NP 10/23/2023 9:35 AM

## 2023-10-23 NOTE — Patient Instructions (Signed)
 [facility] - A DEPT OF [facility]  Discharge Instructions: Thank you for choosing [facility] to provide your oncology and hematology care.  If you have a lab appointment with the Cancer Center, please go directly to the Cancer Center and check in at the registration area.  Wear comfortable clothing and clothing appropriate for easy access to any Portacath or PICC line.   We strive to give you quality time with your provider. You may need to reschedule your appointment if you arrive late (15 or more minutes).  Arriving late affects you and other patients whose appointments are after yours.  Also, if you miss three or more appointments without notifying the office, you may be dismissed from the clinic at the provider's discretion.      For prescription refill requests, have your pharmacy contact our office and allow 72 hours for refills to be completed.    Today you received the following chemotherapy and/or immunotherapy agents Darazalex.      To help prevent nausea and vomiting after your treatment, we encourage you to take your nausea medication as directed.  BELOW ARE SYMPTOMS THAT SHOULD BE REPORTED IMMEDIATELY: *FEVER GREATER THAN 100.4 F (38 C) OR HIGHER *CHILLS OR SWEATING *NAUSEA AND VOMITING THAT IS NOT CONTROLLED WITH YOUR NAUSEA MEDICATION *UNUSUAL SHORTNESS OF BREATH *UNUSUAL BRUISING OR BLEEDING *URINARY PROBLEMS (pain or burning when urinating, or frequent urination) *BOWEL PROBLEMS (unusual diarrhea, constipation, pain near the anus) TENDERNESS IN MOUTH AND THROAT WITH OR WITHOUT PRESENCE OF ULCERS (sore throat, sores in mouth, or a toothache) UNUSUAL RASH, SWELLING OR PAIN  UNUSUAL VAGINAL DISCHARGE OR ITCHING   Items with * indicate a potential emergency and should be followed up as soon as possible or go to the Emergency Department if any problems should occur.  Please show the CHEMOTHERAPY ALERT CARD or IMMUNOTHERAPY  ALERT CARD at check-in to the Emergency Department and triage nurse.  Should you have questions after your visit or need to cancel or reschedule your appointment, please contact [facility] - A DEPT OF [facility]  [phone] and follow the prompts.  Office hours are 8:00 a.m. to 4:30 p.m. Monday - Friday. Please note that voicemails left after 4:00 p.m. may not be returned until the following business day.  We are closed weekends and major holidays. You have access to a nurse at all times for urgent questions. Please call the main number to the clinic [phone] and follow the prompts.  For any non-urgent questions, you may also contact your provider using MyChart. We now offer e-Visits for anyone 67 and older to request care online for non-urgent symptoms. For details visit [email or web address].   Also download the MyChart app! Go to the app store, search "MyChart", open the app, select [facility], and log in with your MyChart username and password.

## 2023-10-26 LAB — MULTIPLE MYELOMA PANEL, SERUM
Albumin SerPl Elph-Mcnc: 3.4 g/dL (ref 2.9–4.4)
Albumin/Glob SerPl: 1.8 — ABNORMAL HIGH (ref 0.7–1.7)
Alpha 1: 0.2 g/dL (ref 0.0–0.4)
Alpha2 Glob SerPl Elph-Mcnc: 0.6 g/dL (ref 0.4–1.0)
B-Globulin SerPl Elph-Mcnc: 0.7 g/dL (ref 0.7–1.3)
Gamma Glob SerPl Elph-Mcnc: 0.5 g/dL (ref 0.4–1.8)
Globulin, Total: 2 g/dL — ABNORMAL LOW (ref 2.2–3.9)
IgA: 81 mg/dL (ref 64–422)
IgG (Immunoglobin G), Serum: 564 mg/dL — ABNORMAL LOW (ref 586–1602)
IgM (Immunoglobulin M), Srm: 17 mg/dL — ABNORMAL LOW (ref 26–217)
Total Protein ELP: 5.4 g/dL — ABNORMAL LOW (ref 6.0–8.5)

## 2023-10-26 LAB — KAPPA/LAMBDA LIGHT CHAINS
Kappa free light chain: 16.6 mg/L (ref 3.3–19.4)
Kappa, lambda light chain ratio: 1.47 (ref 0.26–1.65)
Lambda free light chains: 11.3 mg/L (ref 5.7–26.3)

## 2023-10-27 ENCOUNTER — Encounter: Payer: Self-pay | Admitting: Internal Medicine

## 2023-10-29 ENCOUNTER — Other Ambulatory Visit: Payer: Self-pay | Admitting: Internal Medicine

## 2023-10-29 DIAGNOSIS — I10 Essential (primary) hypertension: Secondary | ICD-10-CM

## 2023-10-30 ENCOUNTER — Encounter: Payer: Self-pay | Admitting: Internal Medicine

## 2023-11-06 ENCOUNTER — Other Ambulatory Visit: Payer: Self-pay

## 2023-11-11 ENCOUNTER — Ambulatory Visit: Admitting: Student

## 2023-11-20 ENCOUNTER — Encounter: Payer: Self-pay | Admitting: Internal Medicine

## 2023-11-20 ENCOUNTER — Inpatient Hospital Stay: Attending: Nurse Practitioner

## 2023-11-20 ENCOUNTER — Inpatient Hospital Stay

## 2023-11-20 ENCOUNTER — Inpatient Hospital Stay (HOSPITAL_BASED_OUTPATIENT_CLINIC_OR_DEPARTMENT_OTHER): Admitting: Internal Medicine

## 2023-11-20 VITALS — BP 149/74 | HR 71 | Temp 96.8°F | Resp 16 | Ht 66.0 in | Wt 135.6 lb

## 2023-11-20 DIAGNOSIS — R197 Diarrhea, unspecified: Secondary | ICD-10-CM | POA: Insufficient documentation

## 2023-11-20 DIAGNOSIS — I10 Essential (primary) hypertension: Secondary | ICD-10-CM | POA: Insufficient documentation

## 2023-11-20 DIAGNOSIS — M7989 Other specified soft tissue disorders: Secondary | ICD-10-CM | POA: Diagnosis not present

## 2023-11-20 DIAGNOSIS — E119 Type 2 diabetes mellitus without complications: Secondary | ICD-10-CM | POA: Insufficient documentation

## 2023-11-20 DIAGNOSIS — C9000 Multiple myeloma not having achieved remission: Secondary | ICD-10-CM | POA: Insufficient documentation

## 2023-11-20 DIAGNOSIS — D509 Iron deficiency anemia, unspecified: Secondary | ICD-10-CM | POA: Diagnosis not present

## 2023-11-20 DIAGNOSIS — R634 Abnormal weight loss: Secondary | ICD-10-CM | POA: Insufficient documentation

## 2023-11-20 DIAGNOSIS — M810 Age-related osteoporosis without current pathological fracture: Secondary | ICD-10-CM | POA: Insufficient documentation

## 2023-11-20 DIAGNOSIS — E876 Hypokalemia: Secondary | ICD-10-CM | POA: Diagnosis not present

## 2023-11-20 DIAGNOSIS — R601 Generalized edema: Secondary | ICD-10-CM

## 2023-11-20 DIAGNOSIS — G9341 Metabolic encephalopathy: Secondary | ICD-10-CM

## 2023-11-20 DIAGNOSIS — Z5112 Encounter for antineoplastic immunotherapy: Secondary | ICD-10-CM | POA: Diagnosis present

## 2023-11-20 DIAGNOSIS — Z5181 Encounter for therapeutic drug level monitoring: Secondary | ICD-10-CM

## 2023-11-20 DIAGNOSIS — I509 Heart failure, unspecified: Secondary | ICD-10-CM

## 2023-11-20 LAB — BRAIN NATRIURETIC PEPTIDE: B Natriuretic Peptide: 86.5 pg/mL (ref 0.0–100.0)

## 2023-11-20 LAB — CMP (CANCER CENTER ONLY)
ALT: 11 U/L (ref 0–44)
AST: 19 U/L (ref 15–41)
Albumin: 4 g/dL (ref 3.5–5.0)
Alkaline Phosphatase: 56 U/L (ref 38–126)
Anion gap: 8 (ref 5–15)
BUN: 15 mg/dL (ref 8–23)
CO2: 24 mmol/L (ref 22–32)
Calcium: 9.4 mg/dL (ref 8.9–10.3)
Chloride: 104 mmol/L (ref 98–111)
Creatinine: 0.86 mg/dL (ref 0.44–1.00)
GFR, Estimated: >60 mL/min (ref >60)
Glucose, Bld: 182 mg/dL — ABNORMAL HIGH (ref 70–99)
Potassium: 3.9 mmol/L (ref 3.5–5.1)
Sodium: 136 mmol/L (ref 135–145)
Total Bilirubin: 0.5 mg/dL (ref 0.0–1.2)
Total Protein: 6.5 g/dL (ref 6.5–8.1)

## 2023-11-20 LAB — CBC WITH DIFFERENTIAL/PLATELET
Abs Immature Granulocytes: 0.01 K/uL (ref 0.00–0.07)
Basophils Absolute: 0 K/uL (ref 0.0–0.1)
Basophils Relative: 0 %
Eosinophils Absolute: 0.1 K/uL (ref 0.0–0.5)
Eosinophils Relative: 2 %
HCT: 34.5 % — ABNORMAL LOW (ref 36.0–46.0)
Hemoglobin: 10.8 g/dL — ABNORMAL LOW (ref 12.0–15.0)
Immature Granulocytes: 0 %
Lymphocytes Relative: 41 %
Lymphs Abs: 2.1 K/uL (ref 0.7–4.0)
MCH: 32.3 pg (ref 26.0–34.0)
MCHC: 31.3 g/dL (ref 30.0–36.0)
MCV: 103.3 fL — ABNORMAL HIGH (ref 80.0–100.0)
Monocytes Absolute: 0.4 K/uL (ref 0.1–1.0)
Monocytes Relative: 8 %
Neutro Abs: 2.5 K/uL (ref 1.7–7.7)
Neutrophils Relative %: 49 %
Platelets: 154 K/uL (ref 150–400)
RBC: 3.34 MIL/uL — ABNORMAL LOW (ref 3.87–5.11)
RDW: 14.8 % (ref 11.5–15.5)
WBC: 5.1 K/uL (ref 4.0–10.5)
nRBC: 0 % (ref 0.0–0.2)

## 2023-11-20 MED ORDER — SODIUM CHLORIDE 0.9 % IV SOLN
Freq: Once | INTRAVENOUS | Status: AC
  Filled 2023-11-20: qty 250

## 2023-11-20 MED ORDER — DIPHENHYDRAMINE HCL 25 MG PO CAPS
50.0000 mg | ORAL_CAPSULE | Freq: Once | ORAL | Status: AC
  Administered 2023-11-20: 50 mg via ORAL
  Filled 2023-11-20: qty 2

## 2023-11-20 MED ORDER — DARATUMUMAB-HYALURONIDASE-FIHJ 1800-30000 MG-UT/15ML ~~LOC~~ SOLN
1800.0000 mg | Freq: Once | SUBCUTANEOUS | Status: AC
  Administered 2023-11-20: 1800 mg via SUBCUTANEOUS
  Filled 2023-11-20: qty 15

## 2023-11-20 MED ORDER — ACETAMINOPHEN 325 MG PO TABS
650.0000 mg | ORAL_TABLET | Freq: Once | ORAL | Status: AC
  Administered 2023-11-20: 650 mg via ORAL
  Filled 2023-11-20: qty 2

## 2023-11-20 MED ORDER — ZOLEDRONIC ACID 4 MG/5ML IV CONC
3.0000 mg | Freq: Once | INTRAVENOUS | Status: AC
  Administered 2023-11-20: 3 mg via INTRAVENOUS
  Filled 2023-11-20: qty 3.75

## 2023-11-20 MED ORDER — DEXAMETHASONE 4 MG PO TABS
20.0000 mg | ORAL_TABLET | ORAL | 3 refills | Status: DC
Start: 2023-11-20 — End: 2023-12-18

## 2023-11-20 NOTE — Progress Notes (Signed)
 [facility] CONSULT NOTE  Patient Care Team: Lemon Raisin, MD as PCP - General (Internal Medicine) Arloa Mliss RAMAN, [facility] (Inactive) (Pharmacist) Rennie Cindy SAUNDERS, MD as Consulting Physician (Oncology)  CHIEF COMPLAINTS/PURPOSE OF CONSULTATION: Multiple myeloma  Oncology History Overview Note  # MULTIPLE MYELOMA  [FEB 2023-hypercalcemia status changes]-Active [bone lesions; hypercalcemia; anemia; No renal insuffiencey;Bone marrow-32% plasma cell-   STANDARD Cytogenetics].FEB 2023- S/p dexamethasone  20 mg q day x4.   # REV [15 mg -3 week-On and 1 week-OFF]-DARA; May 2023-discontinued rev 15 diarrhea/hypotension  # MAY 2023- dara-Rev 10 mg 3w/1w   # FEB 2023-hypercalcemia [[facility]-status post calcitonin; bisphosphonate]  BONE MARROW, ASPIRATE, CLOT, CORE:  -Hypercellular bone marrow with plasma cell neoplasm  -See comment   PERIPHERAL BLOOD:  -Normocytic-normochromic anemia   COMMENT:   The bone marrow is hypercellular for age with increased number of  atypical plasma cells representing 32% of all cells in the aspirate  associated with interstitial infiltrates and numerous variably sized  clusters in the clot/biopsy sections.  The plasma cells display weak  kappa light chain restriction consistent with plasma cell neoplasm.  Correlation with cytogenetic and FISH studies is recommended   MICROSCOPIC DESCRIPTION:   PERIPHERAL BLOOD SMEAR: The red blood cells display mild  anisopoikilocytosis with mild polychromasia.  The white blood cells are  normal number with scattered hypogranular neutrophils. An occasional  myelocyte and large atypical mononuclear cells are seen on scan. The  platelets are normal in number.    Multiple myeloma not having achieved remission (HCC)  05/15/2021 Initial Diagnosis   Multiple myeloma not having achieved remission (HCC)   06/27/2021 - 10/17/2021 Chemotherapy   Patient is on Treatment Plan : MYELOMA Daratumumab  SQ + Lenalidomide  +  Dexamethasone  (DaraRd) q28d     06/27/2021 -  Chemotherapy   Patient is on Treatment Plan : MYELOMA RELAPSED REFRACTORY Daratumumab  SQ + Lenalidomide  + Dexamethasone  (DaraRd) q28d     07/11/2021 Cancer Staging   Staging form: Plasma Cell Myeloma and Plasma Cell Disorders, AJCC 8th Edition - Clinical: Beta-2-microglobulin (mg/L): 2.6, Albumin (g/dL): 3.9, ISS: Stage I - Signed by Rennie Cindy SAUNDERS, MD on 07/11/2021 Stage prefix: Initial diagnosis Beta 2 microglobulin range (mg/L): Less than 3.5 Albumin range (g/dL): Greater than or equal to 3.5    HISTORY OF PRESENTING ILLNESS: pt in a wheel chair; accompanied by her daughter.   Cristina Morgan 89 y.o.  female with  multiple myeloma currently on Dara-Rev-Dex is proceed with Dara SQ today.    Pt lost revlimide last month, last dose was 2nd week of August.  Patient  denies any nausea vomiting or worsening diarrhea.  Patient has chronic mild diarrhea on revlimid ; prn immoidum for diarrhea.  No nausea no vomiting.  Appetite is good.  No weight loss .Denies any blood in stools or black-colored stools.   Review of Systems  Constitutional:  Positive for malaise/fatigue and weight loss. Negative for chills, diaphoresis and fever.  HENT:  Negative for nosebleeds and sore throat.   Eyes:  Negative for double vision.  Respiratory:  Negative for cough, hemoptysis, sputum production, shortness of breath and wheezing.   Cardiovascular:  Negative for chest pain, palpitations, orthopnea and leg swelling.  Gastrointestinal:  Negative for abdominal pain, blood in stool, constipation, diarrhea, heartburn, melena, nausea and vomiting.  Genitourinary:  Negative for dysuria, frequency and urgency.  Musculoskeletal:  Positive for back pain and joint pain.  Skin: Negative.  Negative for itching and rash.  Neurological:  Negative for  dizziness, tingling, focal weakness, weakness and headaches.  Endo/Heme/Allergies:  Does not bruise/bleed easily.   Psychiatric/Behavioral:  Negative for depression. The patient is not nervous/anxious and does not have insomnia.     MEDICAL HISTORY:  Past Medical History:  Diagnosis Date   Anemia    CAD (coronary artery disease)    Cataracts, bilateral    Closed compression fracture of first lumbar vertebra (HCC)    Closed compression fracture of second lumbar vertebra (HCC)    Diabetes mellitus without complication (HCC)    High cholesterol    History of pneumonia 2010   [place]   History of uterine fibroid    Hypercalcemia    Hyperlipidemia    Hypertension    Lytic bone lesions on xray    Multiple myeloma (HCC)    Osteoporosis    Primary osteoarthritis of right knee     SURGICAL HISTORY: Past Surgical History:  Procedure Laterality Date   CATARACT EXTRACTION Bilateral 2014   COLONOSCOPY  2011   cleared for 5 yrs- [facility]   ECTOPIC PREGNANCY SURGERY      SOCIAL HISTORY: Social History   Socioeconomic History   Marital status: Married    Spouse name: Not on file   Number of children: 2   Years of education: Not on file   Highest education level: 12th grade  Occupational History   Occupation: Retired  Tobacco Use   Smoking status: Never    Passive exposure: Never   Smokeless tobacco: Never   Tobacco comments:    smoking cessation materials not required  Vaping Use   Vaping status: Never Used  Substance and Sexual Activity   Alcohol use: No   Drug use: No   Sexual activity: Not Currently  Other Topics Concern   Not on file  Social History Narrative   [place] with husband; drives. Never smoked; no alcohol. Daughters live close.    Social Drivers of Corporate investment banker Strain: Low Risk  (11/19/2022)   Overall Financial Resource Strain (CARDIA)    Difficulty of Paying Living Expenses: Not very hard  Food Insecurity: No Food Insecurity (05/21/2023)   Hunger Vital Sign    Worried About Running Out of Food in the Last Year: Never true    Ran Out of Food  in the Last Year: Never true  Transportation Needs: No Transportation Needs (05/21/2023)   PRAPARE - Administrator, Civil Service (Medical): No    Lack of Transportation (Non-Medical): No  Physical Activity: Sufficiently Active (11/19/2022)   Exercise Vital Sign    Days of Exercise per Week: 5 days    Minutes of Exercise per Session: 30 min  Stress: No Stress Concern Present (11/19/2022)   [employer] of Occupational Health - Occupational Stress Questionnaire    Feeling of Stress : Not at all  Social Connections: Moderately Integrated (11/19/2022)   Social Connection and Isolation Panel    Frequency of Communication with Friends and Family: Twice a week    Frequency of Social Gatherings with Friends and Family: More than three times a week    Attends Religious Services: More than 4 times per year    Active Member of [employer] or Organizations: No    Attends Banker Meetings: Never    Marital Status: Married  Catering manager Violence: Not At Risk (05/21/2023)   Humiliation, Afraid, Rape, and Kick questionnaire    Fear of Current or Ex-Partner: No    Emotionally Abused: No  Physically Abused: No    Sexually Abused: No    FAMILY HISTORY: Family History  Problem Relation Age of Onset   Cancer Mother        breast   Heart disease Mother    Diabetes Father    Heart disease Father    Heart attack Father    Heart attack Brother    Diabetes Brother     ALLERGIES:  is allergic to latex and penicillins.  MEDICATIONS:  Current Outpatient Medications  Medication Sig Dispense Refill   Accu-Chek Softclix Lancets lancets Use as instructed 100 each 0   acetaminophen  (TYLENOL ) 325 MG tablet Take 325 mg by mouth 2 (two) times daily. START 2 days prior to chemo infusion. Take For 2 days; Do NOT take on the day of infusion     acyclovir  (ZOVIRAX ) 400 MG tablet TAKE ONE (1) TABLET BY MOUTH TWICE DAILY 60 tablet 11   aspirin  EC 81 MG tablet Take 81 mg by mouth daily.  Swallow whole.     BD PEN NEEDLE NANO 2ND GEN 32G X 4 MM MISC USE 1  ONCE DAILY 100 each 0   Blood Glucose Monitoring Suppl (ACCU-CHEK GUIDE) w/Device KIT USE AS DIRECTED TO CHECK GLUCOSE     Calcium  Carbonate-Vit D-Min (CALCIUM  1200 PO) Take 1 capsule by mouth daily.     carvedilol  (COREG ) 3.125 MG tablet TAKE ONE (1) TABLET BY MOUTH TWICE DAILY 60 tablet 11   diphenoxylate -atropine  (LOMOTIL ) 2.5-0.025 MG tablet TAKE 1 TABLET BY MOUTH FOUR TIMES DAILY AS NEEDED FOR DIARRHEA OR LOOSE STOOLS 60 tablet 3   furosemide  (LASIX ) 20 MG tablet Take 1 tablet (20 mg total) by mouth every morning. 30 tablet 3   Glucerna (GLUCERNA) LIQD Take 237 mLs by mouth.     glucose blood (ACCU-CHEK GUIDE) test strip Use as instructed 100 each 0   hydrOXYzine  (ATARAX ) 10 MG tablet Take 1 tablet (10 mg total) by mouth at bedtime as needed. 90 tablet 1   insulin  glargine (LANTUS  SOLOSTAR) 100 UNIT/ML Solostar Pen Inject 10 Units into the skin daily. 6 units every day except Wed and Thursday- 8 units 15 mL 0   lenalidomide  (REVLIMID ) 10 MG capsule Take 1 capsule (10 mg total) by mouth daily. I capsule daily for 21 days then 7 days off. [employer] Auth # [id number]  Date Obtained 07/28/2023 21 capsule 0   losartan  (COZAAR ) 25 MG tablet Take 1 tablet (25 mg total) by mouth every evening. 90 tablet 1   lovastatin  (MEVACOR ) 40 MG tablet Take 1 tablet (40 mg total) by mouth every evening. 90 tablet 1   metFORMIN  (GLUCOPHAGE ) 1000 MG tablet Take 1 tablet (1,000 mg total) by mouth 2 (two) times daily. 180 tablet 1   montelukast  (SINGULAIR ) 10 MG tablet STARTING 2 DAYS BEFORE INFUSION, TAKE 1 TABLET BY MOUTH ONCE DAILY *DO NOT TAKE ON DAY OF INFUSION* 20 tablet 11   ondansetron  (ZOFRAN ) 8 MG tablet One pill every 8 hours as needed for nausea/vomitting. 40 tablet 1   pantoprazole  (PROTONIX ) 40 MG tablet Take 1 tablet (40 mg total) by mouth daily. 90 tablet 1   potassium chloride  SA (KLOR-CON  M20) 20 MEQ tablet Take 1 tablet (20 mEq total)  by mouth daily. 60 tablet 1   prochlorperazine  (COMPAZINE ) 10 MG tablet TAKE 1 TABLET BY MOUTH EVERY 6 HOURS AS NEEDED FOR NAUSEA FOR VOMITING 40 tablet 0   sertraline  (ZOLOFT ) 25 MG tablet TAKE 1 TABLET BY MOUTH DAILY 90 tablet 11  triamcinolone  (NASACORT ) 55 MCG/ACT AERO nasal inhaler 2 sprays 2 (two) times daily.     dexamethasone  (DECADRON ) 4 MG tablet Take 5 tablets (20 mg total) by mouth as directed. Take one hour before monthly Darzalex  injections. 15 tablet 3   No current facility-administered medications for this visit.   Facility-Administered Medications Ordered in Other Visits  Medication Dose Route Frequency Provider Last Rate Last Admin   daratumumab -hyaluronidase -fihj (DARZALEX  FASPRO) 1800-30000 MG-UT/15ML chemo SQ injection 1,800 mg  1,800 mg Subcutaneous Once Allen, Lauren G, NP       zoledronic  acid (ZOMETA ) 3 mg in sodium chloride  0.9 % 100 mL IVPB  3 mg Intravenous Once Lotus Gover R, MD 415 mL/hr at 11/20/23 1009 3 mg at 11/20/23 1009    PHYSICAL EXAMINATION:  Vitals:   11/20/23 0827 11/20/23 0848  BP: (!) 149/74 (!) 149/74  Pulse: 71   Resp: 16   Temp: (!) 96.8 F (36 C)   SpO2: 100%        Filed Weights   11/20/23 0827  Weight: 135 lb 9.6 oz (61.5 kg)        Physical Exam Vitals and nursing note reviewed.  HENT:     Head: Normocephalic and atraumatic.     Mouth/Throat:     Pharynx: Oropharynx is clear.  Eyes:     Extraocular Movements: Extraocular movements intact.     Pupils: Pupils are equal, round, and reactive to light.  Cardiovascular:     Rate and Rhythm: Normal rate and regular rhythm.  Pulmonary:     Comments: Decreased breath sounds bilaterally.  Abdominal:     Palpations: Abdomen is soft.  Musculoskeletal:        General: Normal range of motion.     Cervical back: Normal range of motion.  Skin:    General: Skin is warm.  Neurological:     General: No focal deficit present.     Mental Status: She is alert and  oriented to person, place, and time.  Psychiatric:        Behavior: Behavior normal.        Judgment: Judgment normal.      LABORATORY DATA:  I have reviewed the data as listed Lab Results  Component Value Date   WBC 5.1 11/20/2023   HGB 10.8 (L) 11/20/2023   HCT 34.5 (L) 11/20/2023   MCV 103.3 (H) 11/20/2023   PLT 154 11/20/2023   Recent Labs    09/25/23 0818 10/23/23 0853 11/20/23 0821  NA 135 137 136  K 3.5 3.1* 3.9  CL 106 104 104  CO2 23 25 24   GLUCOSE 194* 173* 182*  BUN 12 18 15   CREATININE 0.99 0.97 0.86  CALCIUM  8.5* 9.2 9.4  GFRNONAA 55* 57* >60  PROT 6.2* 6.1* 6.5  ALBUMIN 3.8 3.6 4.0  AST 22 19 19   ALT 16 14 11   ALKPHOS 52 50 56  BILITOT 0.6 0.6 0.5    RADIOGRAPHIC STUDIES: I have personally reviewed the radiological images as listed and agreed with the findings in the report. No results found.  Multiple myeloma not having achieved remission (HCC) # STAGE I- MULTIPLE MYELOMA -Active [bone lesions; hypercalcemia; anemia; No renal insuffiencey;Bone marrow-32% plasma cell-   STANDARD Cytogenetics].  Currently on Revlimid -Dex- Dara SQ. Partial response noted; AUG  2025 M protein = NEG;  K/L RATIO= 1. 6= WNL- stable.   # Proceed with Dara SQ today + DEX 20 mg PO [at home/pre-meds] Labs today reviewed;  ALSO  Revlimid  at 10 mg dose 3 weeks on 1 week off- as diarrhea is improved.  Stable.  # Hypokalemia severe-DEC 2024  K.2.6 - improved- continue Kdur once a day-  Stable.  # Achy joints/muscle- trial of magnesium  OTC-  Stable.  #Reflux-like symptoms-  Secondary to dexamethasone ; on PPI- continue low-dose 4 mg weekly/WED.  and wants to hold off GI evaluation [per daughter]- stable.     # Anemia-moderate- multifactorial-MM/ IDA-  continue trial of gentle iron . Hb [JAn 2024]- 10-11- FEB  iron  sat-15stable.  # Mild swelling in leg/ likely dependant/compression- no concerns of DVT- stable. on lasix  20 mg/day-stable.  # Diabetes-on oral hypoglycemic agents;  on long acting insulin ; 14 units of lantus  on T/W/Thursday- Defer to PCP-stable.  # labile BP- recent hypotension- but in general HTN: continue losartan ; continue coreg ; BP- 150-160- refilled coreg ; monitor for now.stable.   # Hypercalcemia secondary to multiple myeloma- [FEB 2023-Zometa  ; calcitonin]-Zometa  q 7M- stable.  # Weight loss-s/p re-evaluation with Joli -stable.    #DVT/shingles prophylaxis: Aspirin /acyclovirr- -stable.    # Vaccinations; Ok with flu/COVID shot  # IV access:PIV   # ACP-   * Shelanda- [phone] zometa -q 3 months [08/28/2023]- ; MM; K/L q 4 W  PS # DISPOSITION:  # Dara SQ  # follow up in 4  weeks/Friday  MD: labs- cbc/cmp;BNP; MM panel;K/L light chains;possible zometa - Dara SQ;  -Dr.B       All questions were answered. The patient knows to call the clinic with any problems, questions or concerns.    Cindy JONELLE Joe, MD 11/20/2023 10:10 AM

## 2023-11-20 NOTE — Progress Notes (Signed)
 Pt lost revlimide last month, last dose was 2nd week of August. Needs refill on the dex, pended.  SOB, but no worse.

## 2023-11-20 NOTE — Assessment & Plan Note (Addendum)
#   STAGE I- MULTIPLE MYELOMA -Active [bone lesions; hypercalcemia; anemia; No renal insuffiencey;Bone marrow-32% plasma cell-   STANDARD Cytogenetics].  Currently on Revlimid -Dex- Dara SQ. Partial response noted; AUG  2025 M protein = NEG;  K/L RATIO= 1. 6= WNL- stable.   # Proceed with Dara SQ today + DEX 20 mg PO [at home/pre-meds] Labs today reviewed;  ALSO  Revlimid  at 10 mg dose 3 weeks on 1 week off- as diarrhea is improved.  Stable.  # Hypokalemia severe-DEC 2024  K.2.6 - improved- continue Kdur once a day-  Stable.  # Achy joints/muscle- trial of magnesium  OTC-  Stable.  #Reflux-like symptoms-  Secondary to dexamethasone ; on PPI- continue low-dose 4 mg weekly/WED.  and wants to hold off GI evaluation [per daughter]- stable.     # Anemia-moderate- multifactorial-MM/ IDA-  continue trial of gentle iron . Hb [JAn 2024]- 10-11- FEB  iron  sat-15stable.  # Mild swelling in leg/ likely dependant/compression- no concerns of DVT- stable. on lasix  20 mg/day-stable.  # Diabetes-on oral hypoglycemic agents; on long acting insulin ; 14 units of lantus  on T/W/Thursday- Defer to PCP-stable.  # labile BP- recent hypotension- but in general HTN: continue losartan ; continue coreg ; BP- 150-160- refilled coreg ; monitor for now.stable.   # Hypercalcemia secondary to multiple myeloma- [FEB 2023-Zometa  ; calcitonin]-Zometa  q 8M- stable.  # Weight loss-s/p re-evaluation with Joli -stable.    #DVT/shingles prophylaxis: Aspirin /acyclovirr- -stable.    # Vaccinations; Ok with flu/COVID shot  # IV access:PIV   # ACP-   * [place]a- [phone] zometa -q 3 months [08/28/2023]- ; MM; K/L q 4 W  PS # DISPOSITION:  # Dara SQ  # follow up in 4  weeks/Friday  MD: labs- cbc/cmp;BNP; MM panel;K/L light chains;possible zometa - Dara SQ;  -Dr.B

## 2023-11-21 ENCOUNTER — Other Ambulatory Visit: Payer: Self-pay

## 2023-11-23 LAB — KAPPA/LAMBDA LIGHT CHAINS
Kappa free light chain: 12.3 mg/L (ref 3.3–19.4)
Kappa, lambda light chain ratio: 1.78 — ABNORMAL HIGH (ref 0.26–1.65)
Lambda free light chains: 6.9 mg/L (ref 5.7–26.3)

## 2023-11-24 LAB — MULTIPLE MYELOMA PANEL, SERUM
Albumin SerPl Elph-Mcnc: 3.6 g/dL (ref 2.9–4.4)
Albumin/Glob SerPl: 1.7 (ref 0.7–1.7)
Alpha 1: 0.2 g/dL (ref 0.0–0.4)
Alpha2 Glob SerPl Elph-Mcnc: 0.7 g/dL (ref 0.4–1.0)
B-Globulin SerPl Elph-Mcnc: 0.8 g/dL (ref 0.7–1.3)
Gamma Glob SerPl Elph-Mcnc: 0.5 g/dL (ref 0.4–1.8)
Globulin, Total: 2.2 g/dL (ref 2.2–3.9)
IgA: 66 mg/dL (ref 64–422)
IgG (Immunoglobin G), Serum: 639 mg/dL (ref 586–1602)
IgM (Immunoglobulin M), Srm: 21 mg/dL — ABNORMAL LOW (ref 26–217)
Total Protein ELP: 5.8 g/dL — ABNORMAL LOW (ref 6.0–8.5)

## 2023-11-25 ENCOUNTER — Ambulatory Visit: Payer: Medicare Other | Admitting: Emergency Medicine

## 2023-11-25 VITALS — Ht 64.0 in | Wt 137.0 lb

## 2023-11-25 DIAGNOSIS — Z780 Asymptomatic menopausal state: Secondary | ICD-10-CM

## 2023-11-25 DIAGNOSIS — Z0000 Encounter for general adult medical examination without abnormal findings: Secondary | ICD-10-CM | POA: Diagnosis not present

## 2023-11-25 NOTE — Progress Notes (Signed)
 Subjective:   Cristina Morgan is a 89 y.o. who presents for a Medicare Wellness preventive visit.  As a reminder, Annual Wellness Visits don't include a physical exam, and some assessments may be limited, especially if this visit is performed virtually. We may recommend an in-person follow-up visit with your provider if needed.  Visit Complete: Virtual I connected with  Cathlean RAYMOND Daring on 11/25/23 by a audio enabled telemedicine application and verified that I am speaking with the correct person using two identifiers.  Patient Location: Home  Provider Location: Home Office  I discussed the limitations of evaluation and management by telemedicine. The patient expressed understanding and agreed to proceed.  Vital Signs: Because this visit was a virtual/telehealth visit, some criteria may be missing or patient reported. Any vitals not documented were not able to be obtained and vitals that have been documented are patient reported.  VideoDeclined- This patient declined Librarian, academic. Therefore the visit was completed with audio only.  Persons Participating in Visit: Patient assisted by [place], granddaughter.  AWV Questionnaire: No: Patient Medicare AWV questionnaire was not completed prior to this visit.  Cardiac Risk Factors include: advanced age (>61men, >11 women);diabetes mellitus;dyslipidemia;hypertension     Objective:    Today's Vitals   11/25/23 1053 11/25/23 1054  Weight: 137 lb (62.1 kg)   Height: 5' 4 (1.626 m)   PainSc:  7    Body mass index is 23.52 kg/m.     11/25/2023   11:18 AM 11/20/2023    8:27 AM 10/23/2023    9:09 AM 09/25/2023    8:21 AM 08/28/2023    9:43 AM 07/31/2023    8:55 AM 07/03/2023   10:08 AM  Advanced Directives  Does Patient Have a Medical Advance Directive? No No No No No No No  Would patient like information on creating a medical advance directive? Yes (MAU/Ambulatory/Procedural Areas - Information given) No -  Patient declined  No - Patient declined No - Patient declined  No - Patient declined    Current Medications (verified) Outpatient Encounter Medications as of 11/25/2023  Medication Sig   Accu-Chek Softclix Lancets lancets Use as instructed   acetaminophen  (TYLENOL ) 325 MG tablet Take 325 mg by mouth 2 (two) times daily. START 2 days prior to chemo infusion. Take For 2 days; Do NOT take on the day of infusion   acyclovir  (ZOVIRAX ) 400 MG tablet TAKE ONE (1) TABLET BY MOUTH TWICE DAILY   aspirin  EC 81 MG tablet Take 81 mg by mouth daily. Swallow whole.   BD PEN NEEDLE NANO 2ND GEN 32G X 4 MM MISC USE 1  ONCE DAILY   Blood Glucose Monitoring Suppl (ACCU-CHEK GUIDE) w/Device KIT USE AS DIRECTED TO CHECK GLUCOSE   Calcium  Carbonate-Vit D-Min (CALCIUM  1200 PO) Take 1 capsule by mouth daily.   carvedilol  (COREG ) 3.125 MG tablet TAKE ONE (1) TABLET BY MOUTH TWICE DAILY   dexamethasone  (DECADRON ) 4 MG tablet Take 5 tablets (20 mg total) by mouth as directed. Take one hour before monthly Darzalex  injections.   diphenoxylate -atropine  (LOMOTIL ) 2.5-0.025 MG tablet TAKE 1 TABLET BY MOUTH FOUR TIMES DAILY AS NEEDED FOR DIARRHEA OR LOOSE STOOLS   furosemide  (LASIX ) 20 MG tablet Take 1 tablet (20 mg total) by mouth every morning.   Glucerna (GLUCERNA) LIQD Take 237 mLs by mouth.   glucose blood (ACCU-CHEK GUIDE) test strip Use as instructed   hydrOXYzine  (ATARAX ) 10 MG tablet Take 1 tablet (10 mg total) by mouth at  bedtime as needed.   insulin  glargine (LANTUS  SOLOSTAR) 100 UNIT/ML Solostar Pen Inject 10 Units into the skin daily. 6 units every day except Wed and Thursday- 8 units (Patient taking differently: Inject 10 Units into the skin daily. 6 units every day except Wed and Thursday- 8 units Not taking daily)   lenalidomide  (REVLIMID ) 10 MG capsule Take 1 capsule (10 mg total) by mouth daily. I capsule daily for 21 days then 7 days off. [employer] Auth # [id number]  Date Obtained 07/28/2023   losartan  (COZAAR )  25 MG tablet Take 1 tablet (25 mg total) by mouth every evening. (Patient taking differently: Take 25 mg by mouth every evening. PRN)   lovastatin  (MEVACOR ) 40 MG tablet Take 1 tablet (40 mg total) by mouth every evening.   metFORMIN  (GLUCOPHAGE ) 1000 MG tablet Take 1 tablet (1,000 mg total) by mouth 2 (two) times daily.   montelukast  (SINGULAIR ) 10 MG tablet STARTING 2 DAYS BEFORE INFUSION, TAKE 1 TABLET BY MOUTH ONCE DAILY *DO NOT TAKE ON DAY OF INFUSION*   ondansetron  (ZOFRAN ) 8 MG tablet One pill every 8 hours as needed for nausea/vomitting.   pantoprazole  (PROTONIX ) 40 MG tablet Take 1 tablet (40 mg total) by mouth daily.   potassium chloride  SA (KLOR-CON  M20) 20 MEQ tablet Take 1 tablet (20 mEq total) by mouth daily.   prochlorperazine  (COMPAZINE ) 10 MG tablet TAKE 1 TABLET BY MOUTH EVERY 6 HOURS AS NEEDED FOR NAUSEA FOR VOMITING   sertraline  (ZOLOFT ) 25 MG tablet TAKE 1 TABLET BY MOUTH DAILY   triamcinolone  (NASACORT ) 55 MCG/ACT AERO nasal inhaler 2 sprays 2 (two) times daily. (Patient taking differently: 2 sprays 2 (two) times daily. PRN)   No facility-administered encounter medications on file as of 11/25/2023.    Allergies (verified) Latex and Penicillins   History: Past Medical History:  Diagnosis Date   Anemia    CAD (coronary artery disease)    Cataracts, bilateral    Closed compression fracture of first lumbar vertebra (HCC)    Closed compression fracture of second lumbar vertebra (HCC)    Diabetes mellitus without complication (HCC)    High cholesterol    History of pneumonia 2010   [place]   History of uterine fibroid    Hypercalcemia    Hyperlipidemia    Hypertension    Lytic bone lesions on xray    Multiple myeloma (HCC)    Osteoporosis    Primary osteoarthritis of right knee    Past Surgical History:  Procedure Laterality Date   CATARACT EXTRACTION Bilateral 2014   COLONOSCOPY  2011   cleared for 5 yrs- [facility]   ECTOPIC PREGNANCY SURGERY     Family  History  Problem Relation Age of Onset   Cancer Mother        breast   Heart disease Mother    Diabetes Father    Heart disease Father    Heart attack Father    Heart attack Brother    Diabetes Brother    Social History   Socioeconomic History   Marital status: Married    Spouse name: Metallurgist   Number of children: 2   Years of education: Not on file   Highest education level: 12th grade  Occupational History   Occupation: Retired  Tobacco Use   Smoking status: Never    Passive exposure: Never   Smokeless tobacco: Never   Tobacco comments:    smoking cessation materials not required  Vaping Use   Vaping status: Never Used  Substance and Sexual Activity   Alcohol use: No   Drug use: No   Sexual activity: Not Currently  Other Topics Concern   Not on file  Social History Narrative   [place] husband; . Never smoked; no alcohol. Daughters live close.    Social Drivers of Corporate investment banker Strain: Low Risk  (11/25/2023)   Overall Financial Resource Strain (CARDIA)    Difficulty of Paying Living Expenses: Not very hard  Food Insecurity: No Food Insecurity (11/25/2023)   Hunger Vital Sign    Worried About Running Out of Food in the Last Year: Never true    Ran Out of Food in the Last Year: Never true  Transportation Needs: No Transportation Needs (11/25/2023)   PRAPARE - Administrator, Civil Service (Medical): No    Lack of Transportation (Non-Medical): No  Physical Activity: Inactive (11/25/2023)   Exercise Vital Sign    Days of Exercise per Week: 0 days    Minutes of Exercise per Session: 0 min  Stress: No Stress Concern Present (11/25/2023)   [employer] of Occupational Health - Occupational Stress Questionnaire    Feeling of Stress: Not at all  Social Connections: Moderately Isolated (11/25/2023)   Social Connection and Isolation Panel    Frequency of Communication with Friends and Family: More than three times a week     Frequency of Social Gatherings with Friends and Family: More than three times a week    Attends Religious Services: Never    Database administrator or Organizations: No    Attends Engineer, structural: Never    Marital Status: Married    Tobacco Counseling Counseling given: Not Answered Tobacco comments: smoking cessation materials not required    Clinical Intake:  Pre-visit preparation completed: Yes  Pain : 0-10 Pain Score: 7  Pain Type: Chronic pain Pain Location: Back Pain Descriptors / Indicators: Aching     BMI - recorded: 25.32 Nutritional Status: BMI of 19-24  Normal Nutritional Risks: None Diabetes: Yes CBG done?: No Did pt. bring in CBG monitor from home?: No  Lab Results  Component Value Date   HGBA1C 7.8 (H) 05/21/2023   HGBA1C 7.0 (H) 01/09/2023   HGBA1C 7.7 (H) 09/09/2022     How often do you need to have someone help you when you read instructions, pamphlets, or other written materials from your doctor or pharmacy?: 1 - Never  Interpreter Needed?: No  Information entered by :: Vina Ned, CMA   Activities of Daily Living     11/25/2023   11:00 AM  In your present state of health, do you have any difficulty performing the following activities:  Hearing? 1  Comment wears hearing aid  Vision? 0  Difficulty concentrating or making decisions? 0  Walking or climbing stairs? 1  Comment uses cane prn  Dressing or bathing? 0  Doing errands, shopping? 1  Comment doesn't drive, daughter takes to appointments  Preparing Food and eating ? N  Using the Toilet? N  In the past six months, have you accidently leaked urine? N  Do you have problems with loss of bowel control? N  Managing your Medications? Y  Comment daughter fills pill box  Managing your Finances? N  Housekeeping or managing your Housekeeping? N    Patient Care Team: Lemon Raisin, MD as PCP - General (Internal Medicine) Rennie Cindy SAUNDERS, MD as Consulting Physician  (Oncology) Carolee Manus DASEN., MD (Ophthalmology)  I have updated  your Care Teams any recent Medical Services you may have received from other providers in the past year.     Assessment:   This is a routine wellness examination for Anastasya.  Hearing/Vision screen Hearing Screening - Comments:: Wears hearing aid in left ear Vision Screening - Comments:: Gets DM eye exams, Dr. Manus Edison, [place]   Goals Addressed             This Visit's Progress    Patient Stated       Try to eat more       Depression Screen     11/25/2023   11:14 AM 11/20/2023    8:45 AM 10/23/2023    9:14 AM 09/25/2023    8:37 AM 05/21/2023    2:58 PM 01/09/2023    1:51 PM 11/19/2022    1:10 PM  PHQ 2/9 Scores  PHQ - 2 Score 0 0 0 1 0 0 0  PHQ- 9 Score 2 2 0 6  0 0    Fall Risk     11/25/2023   11:21 AM 05/21/2023    2:58 PM 01/09/2023    1:52 PM 11/19/2022    1:12 PM 09/09/2022    2:39 PM  Fall Risk   Falls in the past year? 0 0 0 0 0  Number falls in past yr: 0 0 0 0 0  Injury with Fall? 0 0 0 0 0  Risk for fall due to : History of fall(s);Impaired balance/gait;Orthopedic patient;Impaired mobility No Fall Risks No Fall Risks No Fall Risks Impaired balance/gait  Follow up Falls evaluation completed;Education provided Falls evaluation completed Falls evaluation completed Falls prevention discussed;Falls evaluation completed Falls evaluation completed    MEDICARE RISK AT HOME:  Medicare Risk at Home Any stairs in or around the home?: Yes (also has a ramp) If so, are there any without handrails?: No Home free of loose throw rugs in walkways, pet beds, electrical cords, etc?: Yes Adequate lighting in your home to reduce risk of falls?: Yes Life alert?: No Use of a cane, walker or w/c?: Yes (cane) Grab bars in the bathroom?: No Shower chair or bench in shower?: Yes Elevated toilet seat or a handicapped toilet?: Yes  TIMED UP AND GO:  Was the test performed?  No  Cognitive Function: 6CIT  completed        11/25/2023   11:23 AM 11/19/2022    1:14 PM 11/13/2021   11:37 AM 08/03/2019    3:00 PM 09/07/2017    9:24 AM  6CIT Screen  What Year? 0 points 0 points 0 points 0 points 0 points  What month? 0 points 0 points 0 points 0 points 0 points  What time? 0 points 0 points 0 points 0 points 0 points  Count back from 20 0 points 0 points 2 points 0 points 0 points  Months in reverse 0 points 4 points 0 points 0 points 0 points  Repeat phrase 0 points 2 points 6 points 4 points 4 points  Total Score 0 points 6 points 8 points 4 points 4 points    Immunizations Immunization History  Administered Date(s) Administered   Fluad Quad(high Dose 65+) 01/25/2019, 12/02/2019, 12/12/2020, 12/30/2021   Fluad Trivalent(High Dose 65+) 01/09/2023   INFLUENZA, HIGH DOSE SEASONAL PF 01/07/2017, 01/25/2018   Influenza,inj,Quad PF,6+ Mos 01/03/2015, 12/27/2015   Moderna Sars-Covid-2 Vaccination 05/11/2019, 06/08/2019, 07/05/2019, 02/17/2020, 11/12/2020   Pneumococcal Conjugate-13 01/07/2017   Pneumococcal Polysaccharide-23 01/25/2019    Screening Tests Health  Maintenance  Topic Date Due   Zoster Vaccines- Shingrix (1 of 2) Never done   OPHTHALMOLOGY EXAM  05/15/2020   FOOT EXAM  09/09/2023   Influenza Vaccine  10/16/2023   COVID-19 Vaccine (6 - 2025-26 season) 11/16/2023   HEMOGLOBIN A1C  11/21/2023   DEXA SCAN  04/08/2024   Medicare Annual Wellness (AWV)  11/24/2024   Pneumococcal Vaccine: 50+ Years  Completed   HPV VACCINES  Aged Out   Meningococcal B Vaccine  Aged Out   DTaP/Tdap/Td  Discontinued    Health Maintenance Items Addressed: DEXA ordered, Diabetic Foot Exam recommended, See Nurse Notes at the end of this note  Additional Screening:  Vision Screening: Recommended annual ophthalmology exams for early detection of glaucoma and other disorders of the eye. Is the patient up to date with their annual eye exam?  Yes  Who is the provider or what is the name of the office  in which the patient attends annual eye exams? Dr. Manus Edison, [place]  Dental Screening: Recommended annual dental exams for proper oral hygiene  Community Resource Referral / Chronic Care Management: CRR required this visit?  No   CCM required this visit?  No   Plan:    I have personally reviewed and noted the following in the patient's chart:   Medical and social history Use of alcohol, tobacco or illicit drugs  Current medications and supplements including opioid prescriptions. Patient is not currently taking opioid prescriptions. Functional ability and status Nutritional status Physical activity Advanced directives List of other physicians Hospitalizations, surgeries, and ER visits in previous 12 months Vitals Screenings to include cognitive, depression, and falls Referrals and appointments  In addition, I have reviewed and discussed with patient certain preventive protocols, quality metrics, and best practice recommendations. A written personalized care plan for preventive services as well as general preventive health recommendations were provided to patient.   Vina Ned, CMA   11/25/2023   After Visit Summary: (Mail) Due to this being a telephonic visit, the after visit summary with patients personalized plan was offered to patient via mail   Notes:  Stacia, patient's granddaughter assisted with today's visit Placed order for DEXA (due ~04/08/24) Requested most recent DM eye exam from Dr. Manus Edison Needs DM foot exam at next OV on 12/09/23 Wants flu vaccine at next OV on 12/09/23 Patient wants to get the covid and shingles vaccines. Screening colonoscopy and MMG no longer recommended due to age Declined DM & Nutrition education referral

## 2023-11-25 NOTE — Patient Instructions (Signed)
 Cristina Morgan,  Thank you for taking the time for your Medicare Wellness Visit. I appreciate your continued commitment to your health goals. Please review the care plan we discussed, and feel free to reach out if I can assist you further.  Medicare recommends these wellness visits once per year to help you and your care team stay ahead of potential health issues. These visits are designed to focus on prevention, allowing your provider to concentrate on managing your acute and chronic conditions during your regular appointments.  Please note that Annual Wellness Visits do not include a physical exam. Some assessments may be limited, especially if the visit was conducted virtually. If needed, we may recommend a separate in-person follow-up with your provider.  Ongoing Care Seeing your primary care provider every 3 to 6 months helps us  monitor your health and provide consistent, personalized care.   Referrals If a referral was made during today's visit and you haven't received any updates within two weeks, please contact the referred provider directly to check on the status.  Recommended Screenings:   Get the flu vaccine at your doctor's appointment on 12/09/23. You may get the shingles vaccines at your local pharmacy. You may get the Covid vaccine at your local pharmacy, but you will need a prescription from your doctor.  Please call to schedule your bone density scan (due 04/08/24):  [facility] Address: [address], [place], [place] Phone: [phone]  [facility] [address], [address] [place], [place] [address] Phone: [phone]   Health Maintenance  Topic Date Due   Zoster (Shingles) Vaccine (1 of 2) Never done   Eye exam for diabetics  05/15/2020   Complete foot exam   09/09/2023   Flu Shot  10/16/2023   COVID-19 Vaccine (6 - 2025-26 season) 11/16/2023   Hemoglobin A1C  11/21/2023   DEXA scan (bone  density measurement)  04/08/2024   Medicare Annual Wellness Visit  11/24/2024   Pneumococcal Vaccine for age over 64  Completed   HPV Vaccine  Aged Out   Meningitis B Vaccine  Aged Out   DTaP/Tdap/Td vaccine  Discontinued       11/25/2023   11:18 AM  Advanced Directives  Does Patient Have a Medical Advance Directive? No  Would patient like information on creating a medical advance directive? Yes (MAU/Ambulatory/Procedural Areas - Information given)   Advance Care Planning is important because it: Ensures you receive medical care that aligns with your values, goals, and preferences. Provides guidance to your family and loved ones, reducing the emotional burden of decision-making during critical moments.  Vision: Annual vision screenings are recommended for early detection of glaucoma, cataracts, and diabetic retinopathy. These exams can also reveal signs of chronic conditions such as diabetes and high blood pressure.  Dental: Annual dental screenings help detect early signs of oral cancer, gum disease, and other conditions linked to overall health, including heart disease and diabetes.  Please see the attached documents for additional preventive care recommendations.   Fall Prevention in the Home, Adult Falls can cause injuries and affect people of all ages. There are many simple things that you can do to make your home safe and to help prevent falls. If you need it, ask for help making these changes. What actions can I take to prevent falls? General information Use good lighting in all rooms. Make sure to: Replace any light bulbs that burn out. Turn on lights if it is  dark and use night-lights. Keep items that you use often in easy-to-reach places. Lower the shelves around your home if needed. Move furniture so that there are clear paths around it. Do not keep throw rugs or other things on the floor that can make you trip. If any of your floors are uneven, fix them. Add color or  contrast paint or tape to clearly mark and help you see: Grab bars or handrails. First and last steps of staircases. Where the edge of each step is. If you use a ladder or stepladder: Make sure that it is fully opened. Do not climb a closed ladder. Make sure the sides of the ladder are locked in place. Have someone hold the ladder while you use it. Know where your pets are as you move through your home. What can I do in the bathroom?     Keep the floor dry. Clean up any water  that is on the floor right away. Remove soap buildup in the bathtub or shower. Buildup makes bathtubs and showers slippery. Use non-skid mats or decals on the floor of the bathtub or shower. Attach bath mats securely with double-sided, non-slip rug tape. If you need to sit down while you are in the shower, use a non-slip stool. Install grab bars by the toilet and in the bathtub and shower. Do not use towel bars as grab bars. What can I do in the bedroom? Make sure that you have a light by your bed that is easy to reach. Do not use any sheets or blankets on your bed that hang to the floor. Have a firm bench or chair with side arms that you can use for support when you get dressed. What can I do in the kitchen? Clean up any spills right away. If you need to reach something above you, use a sturdy step stool that has a grab bar. Keep electrical cables out of the way. Do not use floor polish or wax that makes floors slippery. What can I do with my stairs? Do not leave anything on the stairs. Make sure that you have a light switch at the top and the bottom of the stairs. Have them installed if you do not have them. Make sure that there are handrails on both sides of the stairs. Fix handrails that are broken or loose. Make sure that handrails are as long as the staircases. Install non-slip stair treads on all stairs in your home if they do not have carpet. Avoid having throw rugs at the top or bottom of stairs, or  secure the rugs with carpet tape to prevent them from moving. Choose a carpet design that does not hide the edge of steps on the stairs. Make sure that carpet is firmly attached to the stairs. Fix any carpet that is loose or worn. What can I do on the outside of my home? Use bright outdoor lighting. Repair the edges of walkways and driveways and fix any cracks. Clear paths of anything that can make you trip, such as tools or rocks. Add color or contrast paint or tape to clearly mark and help you see high doorway thresholds. Trim any bushes or trees on the main path into your home. Check that handrails are securely fastened and in good repair. Both sides of all steps should have handrails. Install guardrails along the edges of any raised decks or porches. Have leaves, snow, and ice cleared regularly. Use sand, salt, or ice melt on walkways during winter months if  you live where there is ice and snow. In the garage, clean up any spills right away, including grease or oil spills. What other actions can I take? Review your medicines with your health care provider. Some medicines can make you confused or feel dizzy. This can increase your chance of falling. Wear closed-toe shoes that fit well and support your feet. Wear shoes that have rubber soles and low heels. Use a cane, walker, scooter, or crutches that help you move around if needed. Talk with your provider about other ways that you can decrease your risk of falls. This may include seeing a physical therapist to learn to do exercises to improve movement and strength. Where to find more information Centers for Disease Control and Prevention, STEADI: TonerPromos.no [employer] on Aging: BaseRingTones.pl National Institute on Aging: BaseRingTones.pl Contact a health care provider if: You are afraid of falling at home. You feel weak, drowsy, or dizzy at home. You fall at home. Get help right away if you: Lose consciousness or have trouble moving after a  fall. Have a fall that causes a head injury. These symptoms may be an emergency. Get help right away. Call 911. Do not wait to see if the symptoms will go away. Do not drive yourself to the hospital. This information is not intended to replace advice given to you by your health care provider. Make sure you discuss any questions you have with your health care provider. Document Revised: 11/04/2021 Document Reviewed: 11/04/2021 Elsevier Patient Education  2024 [employer].

## 2023-11-26 ENCOUNTER — Other Ambulatory Visit: Payer: Self-pay

## 2023-12-02 ENCOUNTER — Other Ambulatory Visit: Payer: Self-pay | Admitting: *Deleted

## 2023-12-02 DIAGNOSIS — C9000 Multiple myeloma not having achieved remission: Secondary | ICD-10-CM

## 2023-12-02 MED ORDER — LENALIDOMIDE 10 MG PO CAPS: 10.0000 mg | ORAL_CAPSULE | Freq: Every day | ORAL | 0 refills | Status: DC

## 2023-12-09 ENCOUNTER — Encounter: Admitting: Student

## 2023-12-18 ENCOUNTER — Inpatient Hospital Stay

## 2023-12-18 ENCOUNTER — Encounter: Payer: Self-pay | Admitting: Internal Medicine

## 2023-12-18 ENCOUNTER — Inpatient Hospital Stay (HOSPITAL_BASED_OUTPATIENT_CLINIC_OR_DEPARTMENT_OTHER): Admitting: Internal Medicine

## 2023-12-18 ENCOUNTER — Inpatient Hospital Stay: Attending: Nurse Practitioner

## 2023-12-18 VITALS — BP 144/71 | HR 75 | Temp 98.2°F | Resp 16 | Ht 64.0 in | Wt 136.6 lb

## 2023-12-18 VITALS — BP 165/72 | HR 75

## 2023-12-18 DIAGNOSIS — Z79899 Other long term (current) drug therapy: Secondary | ICD-10-CM | POA: Insufficient documentation

## 2023-12-18 DIAGNOSIS — E876 Hypokalemia: Secondary | ICD-10-CM | POA: Diagnosis not present

## 2023-12-18 DIAGNOSIS — C9000 Multiple myeloma not having achieved remission: Secondary | ICD-10-CM

## 2023-12-18 DIAGNOSIS — Z5112 Encounter for antineoplastic immunotherapy: Secondary | ICD-10-CM | POA: Insufficient documentation

## 2023-12-18 DIAGNOSIS — I10 Essential (primary) hypertension: Secondary | ICD-10-CM | POA: Insufficient documentation

## 2023-12-18 DIAGNOSIS — D630 Anemia in neoplastic disease: Secondary | ICD-10-CM | POA: Diagnosis not present

## 2023-12-18 DIAGNOSIS — Z7961 Long term (current) use of immunomodulator: Secondary | ICD-10-CM | POA: Diagnosis not present

## 2023-12-18 DIAGNOSIS — R600 Localized edema: Secondary | ICD-10-CM | POA: Diagnosis not present

## 2023-12-18 DIAGNOSIS — I2510 Atherosclerotic heart disease of native coronary artery without angina pectoris: Secondary | ICD-10-CM | POA: Insufficient documentation

## 2023-12-18 DIAGNOSIS — I509 Heart failure, unspecified: Secondary | ICD-10-CM

## 2023-12-18 DIAGNOSIS — R197 Diarrhea, unspecified: Secondary | ICD-10-CM | POA: Insufficient documentation

## 2023-12-18 DIAGNOSIS — R601 Generalized edema: Secondary | ICD-10-CM

## 2023-12-18 DIAGNOSIS — E119 Type 2 diabetes mellitus without complications: Secondary | ICD-10-CM | POA: Diagnosis not present

## 2023-12-18 DIAGNOSIS — E7800 Pure hypercholesterolemia, unspecified: Secondary | ICD-10-CM | POA: Diagnosis not present

## 2023-12-18 LAB — CMP (CANCER CENTER ONLY)
ALT: 10 U/L (ref 0–44)
AST: 17 U/L (ref 15–41)
Albumin: 4 g/dL (ref 3.5–5.0)
Alkaline Phosphatase: 58 U/L (ref 38–126)
Anion gap: 8 (ref 5–15)
BUN: 16 mg/dL (ref 8–23)
CO2: 26 mmol/L (ref 22–32)
Calcium: 9 mg/dL (ref 8.9–10.3)
Chloride: 100 mmol/L (ref 98–111)
Creatinine: 1.04 mg/dL — ABNORMAL HIGH (ref 0.44–1.00)
GFR, Estimated: 52 mL/min — ABNORMAL LOW (ref >60)
Glucose, Bld: 176 mg/dL — ABNORMAL HIGH (ref 70–99)
Potassium: 3.5 mmol/L (ref 3.5–5.1)
Sodium: 134 mmol/L — ABNORMAL LOW (ref 135–145)
Total Bilirubin: 0.8 mg/dL (ref 0.0–1.2)
Total Protein: 6.5 g/dL (ref 6.5–8.1)

## 2023-12-18 LAB — CBC WITH DIFFERENTIAL/PLATELET
Abs Immature Granulocytes: 0.02 K/uL (ref 0.00–0.07)
Basophils Absolute: 0 K/uL (ref 0.0–0.1)
Basophils Relative: 1 %
Eosinophils Absolute: 0.1 K/uL (ref 0.0–0.5)
Eosinophils Relative: 2 %
HCT: 32.4 % — ABNORMAL LOW (ref 36.0–46.0)
Hemoglobin: 10.5 g/dL — ABNORMAL LOW (ref 12.0–15.0)
Immature Granulocytes: 0 %
Lymphocytes Relative: 27 %
Lymphs Abs: 1.4 K/uL (ref 0.7–4.0)
MCH: 33.4 pg (ref 26.0–34.0)
MCHC: 32.4 g/dL (ref 30.0–36.0)
MCV: 103.2 fL — ABNORMAL HIGH (ref 80.0–100.0)
Monocytes Absolute: 0.3 K/uL (ref 0.1–1.0)
Monocytes Relative: 7 %
Neutro Abs: 3.2 K/uL (ref 1.7–7.7)
Neutrophils Relative %: 63 %
Platelets: 174 K/uL (ref 150–400)
RBC: 3.14 MIL/uL — ABNORMAL LOW (ref 3.87–5.11)
RDW: 15.6 % — ABNORMAL HIGH (ref 11.5–15.5)
WBC: 5.1 K/uL (ref 4.0–10.5)
nRBC: 0 % (ref 0.0–0.2)

## 2023-12-18 LAB — BRAIN NATRIURETIC PEPTIDE: B Natriuretic Peptide: 85.2 pg/mL (ref 0.0–100.0)

## 2023-12-18 MED ORDER — ZOLEDRONIC ACID 4 MG/5ML IV CONC
3.0000 mg | Freq: Once | INTRAVENOUS | Status: AC
  Administered 2023-12-18: 3 mg via INTRAVENOUS
  Filled 2023-12-18: qty 3.75

## 2023-12-18 MED ORDER — DIPHENHYDRAMINE HCL 25 MG PO CAPS
50.0000 mg | ORAL_CAPSULE | Freq: Once | ORAL | Status: AC
  Administered 2023-12-18: 50 mg via ORAL
  Filled 2023-12-18: qty 2

## 2023-12-18 MED ORDER — SODIUM CHLORIDE 0.9 % IV SOLN
Freq: Once | INTRAVENOUS | Status: AC
  Filled 2023-12-18: qty 250

## 2023-12-18 MED ORDER — DARATUMUMAB-HYALURONIDASE-FIHJ 1800-30000 MG-UT/15ML ~~LOC~~ SOLN
1800.0000 mg | Freq: Once | SUBCUTANEOUS | Status: AC
  Administered 2023-12-18: 1800 mg via SUBCUTANEOUS
  Filled 2023-12-18: qty 15

## 2023-12-18 MED ORDER — ACETAMINOPHEN 325 MG PO TABS
650.0000 mg | ORAL_TABLET | Freq: Once | ORAL | Status: AC
  Administered 2023-12-18: 650 mg via ORAL
  Filled 2023-12-18: qty 2

## 2023-12-18 MED ORDER — DEXAMETHASONE 4 MG PO TABS: 20.0000 mg | ORAL_TABLET | ORAL | 3 refills | Status: AC

## 2023-12-18 MED ORDER — ONDANSETRON HCL 8 MG PO TABS: ORAL_TABLET | ORAL | 1 refills | Status: AC

## 2023-12-18 NOTE — Progress Notes (Signed)
 Needs refills on dex and zofran , pended.  C/o aching all over, comes and goes, started magnesium  otc.

## 2023-12-18 NOTE — Progress Notes (Signed)
 Pt took dexamethasone  prior to treatment.

## 2023-12-18 NOTE — Patient Instructions (Signed)
 [facility] - A DEPT OF [facility]  Discharge Instructions: Thank you for choosing [facility] to provide your oncology and hematology care.  If you have a lab appointment with the Cancer Center, please go directly to the Cancer Center and check in at the registration area.  Wear comfortable clothing and clothing appropriate for easy access to any Portacath or PICC line.   We strive to give you quality time with your provider. You may need to reschedule your appointment if you arrive late (15 or more minutes).  Arriving late affects you and other patients whose appointments are after yours.  Also, if you miss three or more appointments without notifying the office, you may be dismissed from the clinic at the provider's discretion.      For prescription refill requests, have your pharmacy contact our office and allow 72 hours for refills to be completed.    Today you received the following chemotherapy and/or immunotherapy agents Darazalex.      To help prevent nausea and vomiting after your treatment, we encourage you to take your nausea medication as directed.  BELOW ARE SYMPTOMS THAT SHOULD BE REPORTED IMMEDIATELY: *FEVER GREATER THAN 100.4 F (38 C) OR HIGHER *CHILLS OR SWEATING *NAUSEA AND VOMITING THAT IS NOT CONTROLLED WITH YOUR NAUSEA MEDICATION *UNUSUAL SHORTNESS OF BREATH *UNUSUAL BRUISING OR BLEEDING *URINARY PROBLEMS (pain or burning when urinating, or frequent urination) *BOWEL PROBLEMS (unusual diarrhea, constipation, pain near the anus) TENDERNESS IN MOUTH AND THROAT WITH OR WITHOUT PRESENCE OF ULCERS (sore throat, sores in mouth, or a toothache) UNUSUAL RASH, SWELLING OR PAIN  UNUSUAL VAGINAL DISCHARGE OR ITCHING   Items with * indicate a potential emergency and should be followed up as soon as possible or go to the Emergency Department if any problems should occur.  Please show the CHEMOTHERAPY ALERT CARD or IMMUNOTHERAPY  ALERT CARD at check-in to the Emergency Department and triage nurse.  Should you have questions after your visit or need to cancel or reschedule your appointment, please contact [facility] - A DEPT OF [facility]  [phone] and follow the prompts.  Office hours are 8:00 a.m. to 4:30 p.m. Monday - Friday. Please note that voicemails left after 4:00 p.m. may not be returned until the following business day.  We are closed weekends and major holidays. You have access to a nurse at all times for urgent questions. Please call the main number to the clinic [phone] and follow the prompts.  For any non-urgent questions, you may also contact your provider using MyChart. We now offer e-Visits for anyone 67 and older to request care online for non-urgent symptoms. For details visit [email or web address].   Also download the MyChart app! Go to the app store, search "MyChart", open the app, select [facility], and log in with your MyChart username and password.

## 2023-12-18 NOTE — Progress Notes (Signed)
  [facility] CONSULT NOTE  Patient Care Team: Lemon Raisin, MD as PCP - General (Internal Medicine) Rennie Cristina SAUNDERS, MD as Consulting Physician (Oncology) Carolee Manus DASEN., MD (Ophthalmology)  CHIEF COMPLAINTS/PURPOSE OF CONSULTATION: Multiple myeloma  Oncology History Overview Note  # MULTIPLE MYELOMA  [FEB 2023-hypercalcemia status changes]-Active [bone lesions; hypercalcemia; anemia; No renal insuffiencey;Bone marrow-32% plasma cell-   STANDARD Cytogenetics].FEB 2023- S/p dexamethasone  20 mg q day x4.   # REV [15 mg -3 week-On and 1 week-OFF]-DARA; May 2023-discontinued rev 15 diarrhea/hypotension  # MAY 2023- dara-Rev 10 mg 3w/1w   # FEB 2023-hypercalcemia [[facility]-status post calcitonin; bisphosphonate]  BONE MARROW, ASPIRATE, CLOT, CORE:  -Hypercellular bone marrow with plasma cell neoplasm  -See comment   PERIPHERAL BLOOD:  -Normocytic-normochromic anemia   COMMENT:   The bone marrow is hypercellular for age with increased number of  atypical plasma cells representing 32% of all cells in the aspirate  associated with interstitial infiltrates and numerous variably sized  clusters in the clot/biopsy sections.  The plasma cells display weak  kappa light chain restriction consistent with plasma cell neoplasm.  Correlation with cytogenetic and FISH studies is recommended   MICROSCOPIC DESCRIPTION:   PERIPHERAL BLOOD SMEAR: The red blood cells display mild  anisopoikilocytosis with mild polychromasia.  The white blood cells are  normal number with scattered hypogranular neutrophils. An occasional  myelocyte and large atypical mononuclear cells are seen on scan. The  platelets are normal in number.    Multiple myeloma not having achieved remission (HCC)  05/15/2021 Initial Diagnosis   Multiple myeloma not having achieved remission (HCC)   06/27/2021 - 10/17/2021 Chemotherapy   Patient is on Treatment Plan : MYELOMA Daratumumab  SQ + Lenalidomide  +  Dexamethasone  (DaraRd) q28d     06/27/2021 -  Chemotherapy   Patient is on Treatment Plan : MYELOMA RELAPSED REFRACTORY Daratumumab  SQ + Lenalidomide  + Dexamethasone  (DaraRd) q28d     07/11/2021 Cancer Staging   Staging form: Plasma Cell Myeloma and Plasma Cell Disorders, AJCC 8th Edition - Clinical: Beta-2-microglobulin (mg/L): 2.6, Albumin (g/dL): 3.9, ISS: Stage I - Signed by Rennie Cristina SAUNDERS, MD on 07/11/2021 Stage prefix: Initial diagnosis Beta 2 microglobulin range (mg/L): Less than 3.5 Albumin range (g/dL): Greater than or equal to 3.5    HISTORY OF PRESENTING ILLNESS: pt in a wheel chair; accompanied by her daughter.   Cristina Morgan 89 y.o.  female with  multiple myeloma currently on Dara-Rev-Dex is proceed with Dara SQ today.    Patient complains of ongoing aching come and goes. Patient  denies any nausea vomiting or worsening diarrhea.  Patient has chronic mild diarrhea on revlimid ; prn immoidum for diarrhea.  No nausea no vomiting.  Appetite is good.  No weight loss .Denies any blood in stools or black-colored stools.   Review of Systems  Constitutional:  Positive for malaise/fatigue and weight loss. Negative for chills, diaphoresis and fever.  HENT:  Negative for nosebleeds and sore throat.   Eyes:  Negative for double vision.  Respiratory:  Negative for cough, hemoptysis, sputum production, shortness of breath and wheezing.   Cardiovascular:  Negative for chest pain, palpitations, orthopnea and leg swelling.  Gastrointestinal:  Negative for abdominal pain, blood in stool, constipation, diarrhea, heartburn, melena, nausea and vomiting.  Genitourinary:  Negative for dysuria, frequency and urgency.  Musculoskeletal:  Positive for back pain and joint pain.  Skin: Negative.  Negative for itching and rash.  Neurological:  Negative for dizziness, tingling, focal weakness, weakness and  headaches.  Endo/Heme/Allergies:  Does not bruise/bleed easily.  Psychiatric/Behavioral:   Negative for depression. The patient is not nervous/anxious and does not have insomnia.     MEDICAL HISTORY:  Past Medical History:  Diagnosis Date   Anemia    CAD (coronary artery disease)    Cataracts, bilateral    Closed compression fracture of first lumbar vertebra (HCC)    Closed compression fracture of second lumbar vertebra (HCC)    Diabetes mellitus without complication (HCC)    High cholesterol    History of pneumonia 2010   [place]   History of uterine fibroid    Hypercalcemia    Hyperlipidemia    Hypertension    Lytic bone lesions on xray    Multiple myeloma (HCC)    Osteoporosis    Primary osteoarthritis of right knee     SURGICAL HISTORY: Past Surgical History:  Procedure Laterality Date   CATARACT EXTRACTION Bilateral 2014   COLONOSCOPY  2011   cleared for 5 yrs- [facility]   ECTOPIC PREGNANCY SURGERY      SOCIAL HISTORY: Social History   Socioeconomic History   Marital status: Married    Spouse name: Metallurgist   Number of children: 2   Years of education: Not on file   Highest education level: 12th grade  Occupational History   Occupation: Retired  Tobacco Use   Smoking status: Never    Passive exposure: Never   Smokeless tobacco: Never   Tobacco comments:    smoking cessation materials not required  Vaping Use   Vaping status: Never Used  Substance and Sexual Activity   Alcohol use: No   Drug use: No   Sexual activity: Not Currently  Other Topics Concern   Not on file  Social History Narrative   [place] husband; . Never smoked; no alcohol. Daughters live close.    Social Drivers of Corporate investment banker Strain: Low Risk  (11/25/2023)   Overall Financial Resource Strain (CARDIA)    Difficulty of Paying Living Expenses: Not very hard  Food Insecurity: No Food Insecurity (11/25/2023)   Hunger Vital Sign    Worried About Running Out of Food in the Last Year: Never true    Ran Out of Food in the Last Year: Never true   Transportation Needs: No Transportation Needs (11/25/2023)   PRAPARE - Administrator, Civil Service (Medical): No    Lack of Transportation (Non-Medical): No  Physical Activity: Inactive (11/25/2023)   Exercise Vital Sign    Days of Exercise per Week: 0 days    Minutes of Exercise per Session: 0 min  Stress: No Stress Concern Present (11/25/2023)   [employer] of Occupational Health - Occupational Stress Questionnaire    Feeling of Stress: Not at all  Social Connections: Moderately Isolated (11/25/2023)   Social Connection and Isolation Panel    Frequency of Communication with Friends and Family: More than three times a week    Frequency of Social Gatherings with Friends and Family: More than three times a week    Attends Religious Services: Never    Database administrator or Organizations: No    Attends Banker Meetings: Never    Marital Status: Married  Catering manager Violence: Not At Risk (11/25/2023)   Humiliation, Afraid, Rape, and Kick questionnaire    Fear of Current or Ex-Partner: No    Emotionally Abused: No    Physically Abused: No    Sexually Abused: No  FAMILY HISTORY: Family History  Problem Relation Age of Onset   Cancer Mother        breast   Heart disease Mother    Diabetes Father    Heart disease Father    Heart attack Father    Heart attack Brother    Diabetes Brother     ALLERGIES:  is allergic to latex and penicillins.  MEDICATIONS:  Current Outpatient Medications  Medication Sig Dispense Refill   Accu-Chek Softclix Lancets lancets Use as instructed 100 each 0   acetaminophen  (TYLENOL ) 325 MG tablet Take 325 mg by mouth 2 (two) times daily. START 2 days prior to chemo infusion. Take For 2 days; Do NOT take on the day of infusion     acyclovir  (ZOVIRAX ) 400 MG tablet TAKE ONE (1) TABLET BY MOUTH TWICE DAILY 60 tablet 11   aspirin  EC 81 MG tablet Take 81 mg by mouth daily. Swallow whole.     BD PEN NEEDLE NANO 2ND GEN  32G X 4 MM MISC USE 1  ONCE DAILY 100 each 0   Blood Glucose Monitoring Suppl (ACCU-CHEK GUIDE) w/Device KIT USE AS DIRECTED TO CHECK GLUCOSE     Calcium  Carbonate-Vit D-Min (CALCIUM  1200 PO) Take 1 capsule by mouth daily.     carvedilol  (COREG ) 3.125 MG tablet TAKE ONE (1) TABLET BY MOUTH TWICE DAILY 60 tablet 11   diphenoxylate -atropine  (LOMOTIL ) 2.5-0.025 MG tablet TAKE 1 TABLET BY MOUTH FOUR TIMES DAILY AS NEEDED FOR DIARRHEA OR LOOSE STOOLS 60 tablet 3   furosemide  (LASIX ) 20 MG tablet Take 1 tablet (20 mg total) by mouth every morning. 30 tablet 3   Glucerna (GLUCERNA) LIQD Take 237 mLs by mouth.     glucose blood (ACCU-CHEK GUIDE) test strip Use as instructed 100 each 0   hydrOXYzine  (ATARAX ) 10 MG tablet Take 1 tablet (10 mg total) by mouth at bedtime as needed. 90 tablet 1   insulin  glargine (LANTUS  SOLOSTAR) 100 UNIT/ML Solostar Pen Inject 10 Units into the skin daily. 6 units every day except Wed and Thursday- 8 units (Patient taking differently: Inject 10 Units into the skin daily. 6 units every day except Wed and Thursday- 8 units Not taking daily) 15 mL 0   lenalidomide  (REVLIMID ) 10 MG capsule Take 1 capsule (10 mg total) by mouth daily. I capsule daily for 21 days then 7 days off. 21 capsule 0   losartan  (COZAAR ) 25 MG tablet Take 1 tablet (25 mg total) by mouth every evening. (Patient taking differently: Take 25 mg by mouth every evening. PRN) 90 tablet 1   lovastatin  (MEVACOR ) 40 MG tablet Take 1 tablet (40 mg total) by mouth every evening. 90 tablet 1   metFORMIN  (GLUCOPHAGE ) 1000 MG tablet Take 1 tablet (1,000 mg total) by mouth 2 (two) times daily. 180 tablet 1   montelukast  (SINGULAIR ) 10 MG tablet STARTING 2 DAYS BEFORE INFUSION, TAKE 1 TABLET BY MOUTH ONCE DAILY *DO NOT TAKE ON DAY OF INFUSION* 20 tablet 11   pantoprazole  (PROTONIX ) 40 MG tablet Take 1 tablet (40 mg total) by mouth daily. 90 tablet 1   potassium chloride  SA (KLOR-CON  M20) 20 MEQ tablet Take 1 tablet (20 mEq  total) by mouth daily. 60 tablet 1   prochlorperazine  (COMPAZINE ) 10 MG tablet TAKE 1 TABLET BY MOUTH EVERY 6 HOURS AS NEEDED FOR NAUSEA FOR VOMITING 40 tablet 0   sertraline  (ZOLOFT ) 25 MG tablet TAKE 1 TABLET BY MOUTH DAILY 90 tablet 11   triamcinolone  (NASACORT ) 55  MCG/ACT AERO nasal inhaler 2 sprays 2 (two) times daily. (Patient taking differently: 2 sprays 2 (two) times daily. PRN)     dexamethasone  (DECADRON ) 4 MG tablet Take 5 tablets (20 mg total) by mouth as directed. Take one hour before monthly Darzalex  injections. 15 tablet 3   ondansetron  (ZOFRAN ) 8 MG tablet One pill every 8 hours as needed for nausea/vomitting. 40 tablet 1   No current facility-administered medications for this visit.   Facility-Administered Medications Ordered in Other Visits  Medication Dose Route Frequency Provider Last Rate Last Admin   daratumumab -hyaluronidase -fihj (DARZALEX  FASPRO) 1800-30000 MG-UT/15ML chemo SQ injection 1,800 mg  1,800 mg Subcutaneous Once Allena Pietila R, MD       zoledronic  acid (ZOMETA ) 3 mg in sodium chloride  0.9 % 100 mL IVPB  3 mg Intravenous Once Ameisha Mcclellan R, MD        PHYSICAL EXAMINATION:  Vitals:   12/18/23 0905 12/18/23 0932  BP: (!) 145/66 (!) 144/71  Pulse: 75   Resp: 16   Temp: 98.2 F (36.8 C)   SpO2: 99%        Filed Weights   12/18/23 0905  Weight: 136 lb 9.6 oz (62 kg)        Physical Exam Vitals and nursing note reviewed.  HENT:     Head: Normocephalic and atraumatic.     Mouth/Throat:     Pharynx: Oropharynx is clear.  Eyes:     Extraocular Movements: Extraocular movements intact.     Pupils: Pupils are equal, round, and reactive to light.  Cardiovascular:     Rate and Rhythm: Normal rate and regular rhythm.  Pulmonary:     Comments: Decreased breath sounds bilaterally.  Abdominal:     Palpations: Abdomen is soft.  Musculoskeletal:        General: Normal range of motion.     Cervical back: Normal range of motion.   Skin:    General: Skin is warm.  Neurological:     General: No focal deficit present.     Mental Status: She is alert and oriented to person, place, and time.  Psychiatric:        Behavior: Behavior normal.        Judgment: Judgment normal.      LABORATORY DATA:  I have reviewed the data as listed Lab Results  Component Value Date   WBC 5.1 12/18/2023   HGB 10.5 (L) 12/18/2023   HCT 32.4 (L) 12/18/2023   MCV 103.2 (H) 12/18/2023   PLT 174 12/18/2023   Recent Labs    10/23/23 0853 11/20/23 0821 12/18/23 0907  NA 137 136 134*  K 3.1* 3.9 3.5  CL 104 104 100  CO2 25 24 26   GLUCOSE 173* 182* 176*  BUN 18 15 16   CREATININE 0.97 0.86 1.04*  CALCIUM  9.2 9.4 9.0  GFRNONAA 57* >60 52*  PROT 6.1* 6.5 6.5  ALBUMIN 3.6 4.0 4.0  AST 19 19 17   ALT 14 11 10   ALKPHOS 50 56 58  BILITOT 0.6 0.5 0.8    RADIOGRAPHIC STUDIES: I have personally reviewed the radiological images as listed and agreed with the findings in the report. No results found.  Multiple myeloma not having achieved remission (HCC) # STAGE I- MULTIPLE MYELOMA -Active [bone lesions; hypercalcemia; anemia; No renal insuffiencey;Bone marrow-32% plasma cell-   STANDARD Cytogenetics- NEG for FISH].  Currently on Revlimid -Dex- Dara SQ. Partial response noted; AUG  2025 M protein = NEG;  K/L RATIO= 1. 6= WNL-  stable.   # Proceed with Dara SQ today + DEX 20 mg PO [at home/pre-meds] Labs today reviewed;  ALSO  Revlimid  at 10 mg dose 3 weeks on 1 week off- as diarrhea is improved.  Stable.  # Hypokalemia severe-DEC 2024  K.2.6 - improved- continue Kdur once a day-  Stable.  # Achy joints/muscle- trial of magnesium  OTC-  Stable.  #Reflux-like symptoms-  Secondary to dexamethasone ; on PPI- continue low-dose 4 mg weekly/WED.  and wants to hold off GI evaluation [per daughter]- stable.     # Anemia-moderate- multifactorial-MM/ IDA-  continue trial of gentle iron . Hb [JAn 2024]- 10-11- FEB  iron  sat-15- stable.  # Mild  swelling in leg/ likely dependant/compression- no concerns of DVT- stable. on lasix  20 mg/day-stable.  # Diabetes-on oral hypoglycemic agents; on long acting insulin ; 14 units of lantus  on T/W/Thursday- Defer to PCP-stable.  # labile BP- recent hypotension- but in general HTN: continue losartan ; continue coreg ; BP- 150-160- refilled coreg ; monitor for now.stable.   # Hypercalcemia secondary to multiple myeloma- [FEB 2023-Zometa  ; calcitonin]-Zometa  q 44M- stable.   # Weight loss-s/p re-evaluation with Joli -stable.    #DVT/shingles prophylaxis: Aspirin /acyclovirr- -stable.    # Vaccinations; Ok with flu/COVID shot  # IV access:PIV   # ACP-   * Cristina Morgan- [phone] zometa -q 3 months [12/18/2023]- ; MM; K/L q 4 W  PS # DISPOSITION:  # Dara SQ;  zometa  # follow up in 6  weeks/Friday  MD: labs- cbc/cmp;BNP; MM panel;K/L light chains;possible- Dara SQ;  #  follow up in 10  weeks/Friday  MD: labs- cbc/cmp;BNP; MM panel;K/L light chains;possible- Dara SQ;  -Dr.B       All questions were answered. The patient knows to call the clinic with any problems, questions or concerns.    Cristina JONELLE Joe, MD 12/18/2023 10:14 AM

## 2023-12-18 NOTE — Assessment & Plan Note (Addendum)
#   STAGE I- MULTIPLE MYELOMA -Active [bone lesions; hypercalcemia; anemia; No renal insuffiencey;Bone marrow-32% plasma cell-   STANDARD Cytogenetics- NEG for FISH].  Currently on Revlimid -Dex- Dara SQ. Partial response noted; AUG  2025 M protein = NEG;  K/L RATIO= 1. 6= WNL- stable.   # Proceed with Dara SQ today + DEX 20 mg PO [at home/pre-meds] Labs today reviewed;  ALSO  Revlimid  at 10 mg dose 3 weeks on 1 week off- as diarrhea is improved.  Stable.  # Hypokalemia severe-DEC 2024  K.2.6 - improved- continue Kdur once a day-  Stable.  # Achy joints/muscle- trial of magnesium  OTC-  Stable.  #Reflux-like symptoms-  Secondary to dexamethasone ; on PPI- continue low-dose 4 mg weekly/WED.  and wants to hold off GI evaluation [per daughter]- stable.     # Anemia-moderate- multifactorial-MM/ IDA-  continue trial of gentle iron . Hb [JAn 2024]- 10-11- FEB  iron  sat-15- stable.  # Mild swelling in leg/ likely dependant/compression- no concerns of DVT- stable. on lasix  20 mg/day-stable.  # Diabetes-on oral hypoglycemic agents; on long acting insulin ; 14 units of lantus  on T/W/Thursday- Defer to PCP-stable.  # labile BP- recent hypotension- but in general HTN: continue losartan ; continue coreg ; BP- 150-160- refilled coreg ; monitor for now.stable.   # Hypercalcemia secondary to multiple myeloma- [FEB 2023-Zometa  ; calcitonin]-Zometa  q 59M- stable.   # Weight loss-s/p re-evaluation with Joli -stable.    #DVT/shingles prophylaxis: Aspirin /acyclovirr- -stable.    # Vaccinations; Ok with flu/COVID shot  # IV access:PIV   # ACP-   * Shelanda- [phone] zometa -q 3 months [12/18/2023]- ; MM; K/L q 4 W  PS # DISPOSITION:  # Dara SQ;  zometa  # follow up in 6  weeks/Friday  MD: labs- cbc/cmp;BNP; MM panel;K/L light chains;possible- Dara SQ;  #  follow up in 10  weeks/Friday  MD: labs- cbc/cmp;BNP; MM panel;K/L light chains;possible- Dara SQ;  -Dr.B

## 2023-12-21 LAB — KAPPA/LAMBDA LIGHT CHAINS
Kappa free light chain: 14.4 mg/L (ref 3.3–19.4)
Kappa, lambda light chain ratio: 1.47 (ref 0.26–1.65)
Lambda free light chains: 9.8 mg/L (ref 5.7–26.3)

## 2023-12-23 LAB — MULTIPLE MYELOMA PANEL, SERUM
Albumin SerPl Elph-Mcnc: 3.6 g/dL (ref 2.9–4.4)
Albumin/Glob SerPl: 1.6 (ref 0.7–1.7)
Alpha 1: 0.2 g/dL (ref 0.0–0.4)
Alpha2 Glob SerPl Elph-Mcnc: 0.7 g/dL (ref 0.4–1.0)
B-Globulin SerPl Elph-Mcnc: 0.9 g/dL (ref 0.7–1.3)
Gamma Glob SerPl Elph-Mcnc: 0.6 g/dL (ref 0.4–1.8)
Globulin, Total: 2.4 g/dL (ref 2.2–3.9)
IgA: 59 mg/dL — ABNORMAL LOW (ref 64–422)
IgG (Immunoglobin G), Serum: 649 mg/dL (ref 586–1602)
IgM (Immunoglobulin M), Srm: 27 mg/dL (ref 26–217)
M Protein SerPl Elph-Mcnc: 0.2 g/dL — ABNORMAL HIGH
Total Protein ELP: 6 g/dL (ref 6.0–8.5)

## 2023-12-28 ENCOUNTER — Other Ambulatory Visit: Payer: Self-pay | Admitting: *Deleted

## 2023-12-28 DIAGNOSIS — C9000 Multiple myeloma not having achieved remission: Secondary | ICD-10-CM

## 2023-12-28 MED ORDER — LENALIDOMIDE 10 MG PO CAPS: 10.0000 mg | ORAL_CAPSULE | Freq: Every day | ORAL | 0 refills | Status: DC

## 2024-01-03 ENCOUNTER — Other Ambulatory Visit: Payer: Self-pay | Admitting: Internal Medicine

## 2024-01-04 ENCOUNTER — Encounter: Payer: Self-pay | Admitting: Internal Medicine

## 2024-01-06 ENCOUNTER — Other Ambulatory Visit: Payer: Self-pay

## 2024-01-06 DIAGNOSIS — R600 Localized edema: Secondary | ICD-10-CM

## 2024-01-06 MED ORDER — FUROSEMIDE 20 MG PO TABS: 20.0000 mg | ORAL_TABLET | Freq: Every morning | ORAL | 3 refills | Status: DC

## 2024-01-11 ENCOUNTER — Other Ambulatory Visit: Payer: Self-pay | Admitting: *Deleted

## 2024-01-11 ENCOUNTER — Encounter: Payer: Self-pay | Admitting: Student

## 2024-01-11 ENCOUNTER — Ambulatory Visit: Admitting: Student

## 2024-01-11 VITALS — BP 138/60 | HR 62 | Ht 64.0 in | Wt 133.0 lb

## 2024-01-11 DIAGNOSIS — I872 Venous insufficiency (chronic) (peripheral): Secondary | ICD-10-CM | POA: Diagnosis not present

## 2024-01-11 DIAGNOSIS — F32A Depression, unspecified: Secondary | ICD-10-CM

## 2024-01-11 DIAGNOSIS — Z23 Encounter for immunization: Secondary | ICD-10-CM | POA: Diagnosis not present

## 2024-01-11 DIAGNOSIS — E7800 Pure hypercholesterolemia, unspecified: Secondary | ICD-10-CM

## 2024-01-11 DIAGNOSIS — E119 Type 2 diabetes mellitus without complications: Secondary | ICD-10-CM

## 2024-01-11 DIAGNOSIS — Z794 Long term (current) use of insulin: Secondary | ICD-10-CM

## 2024-01-11 DIAGNOSIS — Z7984 Long term (current) use of oral hypoglycemic drugs: Secondary | ICD-10-CM

## 2024-01-11 DIAGNOSIS — I10 Essential (primary) hypertension: Secondary | ICD-10-CM | POA: Diagnosis not present

## 2024-01-11 DIAGNOSIS — E876 Hypokalemia: Secondary | ICD-10-CM | POA: Diagnosis not present

## 2024-01-11 LAB — POCT GLYCOSYLATED HEMOGLOBIN (HGB A1C): Hemoglobin A1C: 7.7 % — AB (ref 4.0–5.6)

## 2024-01-11 MED ORDER — EMPAGLIFLOZIN 10 MG PO TABS
10.0000 mg | ORAL_TABLET | Freq: Every day | ORAL | 1 refills | Status: AC
Start: 2024-01-11 — End: ?

## 2024-01-11 MED ORDER — FUROSEMIDE 20 MG PO TABS: 20.0000 mg | ORAL_TABLET | Freq: Every day | ORAL | 1 refills | Status: DC

## 2024-01-11 NOTE — Progress Notes (Unsigned)
 Established Patient Office Visit  Subjective   Patient ID: Cristina Morgan, female    DOB: Jan 20, 1935  Age: 89 y.o. MRN: [id number]  Chief Complaint  Patient presents with   Establish Care    Patient is here today to establish care with new PCP    Cristina Morgan is a 89 y.o. person with medical hx listed below presents today for transfer of care.  Patient Active Problem List   Diagnosis Date Noted   Iron  deficiency anemia 01/23/2023   Multiple myeloma not having achieved remission (HCC) 05/15/2021   Pressure injury of coccygeal region, stage 2 (HCC) 05/07/2021   Pressure injury of skin 05/02/2021   Vitamin B12 deficiency 05/02/2021   Hypomagnesemia 05/02/2021   Hypophosphatemia 05/02/2021   Primary osteoarthritis of right knee 12/08/2016   Diabetes mellitus treated with insulin  and oral medication (HCC) 03/20/2014   Hypercholesterolemia 03/20/2014   Essential hypertension 03/20/2014      ROS Refer to HPI    Objective:     Outpatient Encounter Medications as of 01/11/2024  Medication Sig   Accu-Chek Softclix Lancets lancets Use as instructed   acetaminophen  (TYLENOL ) 325 MG tablet Take 325 mg by mouth 2 (two) times daily. START 2 days prior to chemo infusion. Take For 2 days; Do NOT take on the day of infusion   acyclovir  (ZOVIRAX ) 400 MG tablet TAKE ONE (1) TABLET BY MOUTH TWICE DAILY   aspirin  EC 81 MG tablet Take 81 mg by mouth daily. Swallow whole.   BD PEN NEEDLE NANO 2ND GEN 32G X 4 MM MISC USE 1  ONCE DAILY   Blood Glucose Monitoring Suppl (ACCU-CHEK GUIDE) w/Device KIT USE AS DIRECTED TO CHECK GLUCOSE   Calcium  Carbonate-Vit D-Min (CALCIUM  1200 PO) Take 1 capsule by mouth daily.   carvedilol  (COREG ) 3.125 MG tablet TAKE ONE (1) TABLET BY MOUTH TWICE DAILY   dexamethasone  (DECADRON ) 4 MG tablet Take 5 tablets (20 mg total) by mouth as directed. Take one hour before monthly Darzalex  injections.   diphenoxylate -atropine  (LOMOTIL ) 2.5-0.025 MG tablet TAKE 1  TABLET BY MOUTH FOUR TIMES DAILY AS NEEDED FOR DIARRHEA OR LOOSE STOOLS   furosemide  (LASIX ) 20 MG tablet Take 1 tablet (20 mg total) by mouth every morning.   Glucerna (GLUCERNA) LIQD Take 237 mLs by mouth.   glucose blood (ACCU-CHEK GUIDE) test strip Use as instructed   hydrOXYzine  (ATARAX ) 10 MG tablet Take 1 tablet (10 mg total) by mouth at bedtime as needed.   insulin  glargine (LANTUS  SOLOSTAR) 100 UNIT/ML Solostar Pen Inject 10 Units into the skin daily. 6 units every day except Wed and Thursday- 8 units (Patient taking differently: Inject 10 Units into the skin daily. 6 units every day except Wed and Thursday- 8 units Not taking daily)   lenalidomide  (REVLIMID ) 10 MG capsule Take 1 capsule (10 mg total) by mouth daily. I capsule daily for 21 days then 7 days off.   losartan  (COZAAR ) 25 MG tablet Take 1 tablet (25 mg total) by mouth every evening. (Patient taking differently: Take 25 mg by mouth every evening. PRN)   lovastatin  (MEVACOR ) 40 MG tablet Take 1 tablet (40 mg total) by mouth every evening.   metFORMIN  (GLUCOPHAGE ) 1000 MG tablet Take 1 tablet (1,000 mg total) by mouth 2 (two) times daily.   montelukast  (SINGULAIR ) 10 MG tablet STARTING 2 DAYS BEFORE INFUSION, TAKE 1 TABLET BY MOUTH ONCE DAILY *DO NOT TAKE ON DAY OF INFUSION*   ondansetron  (ZOFRAN ) 8 MG tablet One  pill every 8 hours as needed for nausea/vomitting.   pantoprazole  (PROTONIX ) 40 MG tablet Take 1 tablet (40 mg total) by mouth daily.   potassium chloride  SA (KLOR-CON  M) 20 MEQ tablet Take 1 tablet by mouth twice daily   prochlorperazine  (COMPAZINE ) 10 MG tablet TAKE 1 TABLET BY MOUTH EVERY 6 HOURS AS NEEDED FOR NAUSEA FOR VOMITING   sertraline  (ZOLOFT ) 25 MG tablet TAKE 1 TABLET BY MOUTH DAILY   triamcinolone  (NASACORT ) 55 MCG/ACT AERO nasal inhaler 2 sprays 2 (two) times daily. (Patient taking differently: 2 sprays 2 (two) times daily. PRN)   No facility-administered encounter medications on file as of 01/11/2024.     Ht 5' 4 (1.626 m)   BMI 23.45 kg/m  BP Readings from Last 3 Encounters:  12/18/23 (!) 165/72  12/18/23 (!) 144/71  11/20/23 (!) 149/74    Physical Exam     12/18/2023    9:27 AM 11/25/2023   11:14 AM 11/20/2023    8:45 AM  Depression screen PHQ 2/9  Decreased Interest 0 0 0  Down, Depressed, Hopeless 0 0 0  PHQ - 2 Score 0 0 0  Altered sleeping 0 0 0  Tired, decreased energy 0 0 0  Change in appetite 2 2 2   Feeling bad or failure about yourself  0 0 0  Trouble concentrating 0 0 0  Moving slowly or fidgety/restless 0 0 0  Suicidal thoughts  0   PHQ-9 Score 2 2 2   Difficult doing work/chores  Not difficult at all        05/21/2023    2:58 PM 01/09/2023    1:51 PM 09/09/2022    2:40 PM 05/02/2022    2:10 PM  GAD 7 : Generalized Anxiety Score  Nervous, Anxious, on Edge 0 0 0 0  Control/stop worrying 0 0 0 0  Worry too much - different things 0 0 0 0  Trouble relaxing 0 0 0 0  Restless 0 0 0 0  Easily annoyed or irritable 0  0 0  Afraid - awful might happen 0 0 0 0  Total GAD 7 Score 0  0 0  Anxiety Difficulty Not difficult at all Not difficult at all Not difficult at all Not difficult at all    No results found for any visits on 01/11/24.  {Labs (Optional):23779}  The ASCVD Risk score (Arnett DK, et al., 2019) failed to calculate for the following reasons:   The 2019 ASCVD risk score is only valid for ages 12 to 86    Assessment & Plan:  There are no diagnoses linked to this encounter.   No follow-ups on file.    Cristina Saddler, MD

## 2024-01-11 NOTE — Patient Instructions (Addendum)
 Your A1c is 7.7% today Stop insulin  and start jardiance 10 mg daily  Please stop lasix  as jardiance has a diuretic effect Please wear your compression stocking every day and keep your legs elevated when sitting  If your weight in increase by more than 3-5 pound or the swelling the legs is worse please give me a call  Please wear looser pants to you next appointment and I will do a foot and leg exam.  Please bring you blood sugar and BP log to you next appointment   Follow up in 1 month

## 2024-01-11 NOTE — Assessment & Plan Note (Signed)
 Currently on Metformin  1000 mg twice daily and glargine 6-8units daily. Fasting glucoses are 90-110 lately. Has had hypoglycemic events last 63 last week.  A1c is 7.7% does talk to diabetic educator. Does not give insulin  if glucose is under 100  only using insulin  1-2 times a week.

## 2024-01-12 ENCOUNTER — Other Ambulatory Visit: Payer: Self-pay | Admitting: Student

## 2024-01-12 DIAGNOSIS — Z7984 Long term (current) use of oral hypoglycemic drugs: Secondary | ICD-10-CM

## 2024-01-12 DIAGNOSIS — E7800 Pure hypercholesterolemia, unspecified: Secondary | ICD-10-CM

## 2024-01-12 NOTE — Telephone Encounter (Unsigned)
 Copied from CRM [id number]. Topic: Clinical - Medication Refill >> Jan 12, 2024  9:08 AM Fonda T wrote: Medication: metFORMIN  (GLUCOPHAGE ) 1000 MG tablet  lovastatin  (MEVACOR ) 40 MG tablet  Has the patient contacted their pharmacy? Yes, Tulsi, from [facility] pharmacy is calling for patient   This is the patient's preferred pharmacy:   [facility], [place] - [address] [place] [address] Phone: [phone] Fax: [phone]   Is this the correct pharmacy for this prescription? Yes If no, delete pharmacy and type the correct one.   Has the prescription been filled recently? Yes  Is the patient out of the medication? Yes  Has the patient been seen for an appointment in the last year OR does the patient have an upcoming appointment? Yes  Can we respond through MyChart? Yes  Agent: Please be advised that Rx refills may take up to 3 business days. We ask that you follow-up with your pharmacy.

## 2024-01-13 DIAGNOSIS — F32A Depression, unspecified: Secondary | ICD-10-CM | POA: Insufficient documentation

## 2024-01-13 DIAGNOSIS — E876 Hypokalemia: Secondary | ICD-10-CM | POA: Insufficient documentation

## 2024-01-13 DIAGNOSIS — I872 Venous insufficiency (chronic) (peripheral): Secondary | ICD-10-CM | POA: Insufficient documentation

## 2024-01-13 MED ORDER — LOVASTATIN 40 MG PO TABS: 40.0000 mg | ORAL_TABLET | Freq: Every evening | ORAL | 1 refills | Status: AC

## 2024-01-13 MED ORDER — METFORMIN HCL 1000 MG PO TABS: 1000.0000 mg | ORAL_TABLET | Freq: Two times a day (BID) | ORAL | 0 refills | Status: DC

## 2024-01-13 NOTE — Assessment & Plan Note (Signed)
    Latest Ref Rng & Units 12/18/2023    9:07 AM 11/20/2023    8:21 AM 10/23/2023    8:53 AM  CMP  Glucose 70 - 99 mg/dL 823  817  826   BUN 8 - 23 mg/dL 16  15  18    Creatinine 0.44 - 1.00 mg/dL 8.95  9.13  9.02   Sodium 135 - 145 mmol/L 134  136  137   Potassium 3.5 - 5.1 mmol/L 3.5  3.9  3.1   Chloride 98 - 111 mmol/L 100  104  104   CO2 22 - 32 mmol/L 26  24  25    Calcium  8.9 - 10.3 mg/dL 9.0  9.4  9.2   Total Protein 6.5 - 8.1 g/dL 6.5  6.5  6.1   Total Bilirubin 0.0 - 1.2 mg/dL 0.8  0.5  0.6   Alkaline Phos 38 - 126 U/L 58  56  50   AST 15 - 41 U/L 17  19  19    ALT 0 - 44 U/L 10  11  14     On potassium 20 mEq twice daily with. K is low normal likely related for diarrhea from treatment of MM.

## 2024-01-13 NOTE — Assessment & Plan Note (Signed)
 Currently on lovastatin  40 mg daily. LDL 59 on 05/21/2023. Continue current medications.

## 2024-01-13 NOTE — Assessment & Plan Note (Signed)
 Normotensive today. On Losartan  25 mg daily, lasix  20 mg daily, and coreg  3.125 mg twice daily. Only taking losartan  as needed at night due to labile pressures. Will stop lasix  as we are starting jardiance today and patient reports lately have more episodes where BP is been lower.

## 2024-01-13 NOTE — Assessment & Plan Note (Signed)
 On lasix  20 mg daily. Reports swelling is much improved when she uses compression stockings but does not wear it regularly. Discussed using compression consistently. Hold lasix  as we are starting jardiance. She will call if swelling is worsening.

## 2024-01-13 NOTE — Assessment & Plan Note (Signed)
 On zoloft  25 mg daily. Mood well controlled. Discuss further at next appointment.

## 2024-01-13 NOTE — Telephone Encounter (Signed)
 Requested Prescriptions  Pending Prescriptions Disp Refills   metFORMIN  (GLUCOPHAGE ) 1000 MG tablet 180 tablet 0    Sig: Take 1 tablet (1,000 mg total) by mouth 2 (two) times daily.     Endocrinology:  Diabetes - Biguanides Failed - 01/13/2024  3:59 PM      Failed - Cr in normal range and within 360 days    Creatinine  Date Value Ref Range Status  12/18/2023 1.04 (H) 0.44 - 1.00 mg/dL Final         Failed - eGFR in normal range and within 360 days    GFR calc Af Amer  Date Value Ref Range Status  12/02/2019 90 >59 mL/min/1.73 Final    Comment:    **[facility] currently reports eGFR in compliance with the current**   recommendations of the [employer]. [facility] will   update reporting as new guidelines are published from the NKF-ASN   Task force.    GFR, Estimated  Date Value Ref Range Status  12/18/2023 52 (L) >60 mL/min Final    Comment:    (NOTE) Calculated using the CKD-EPI Creatinine Equation (2021)    eGFR  Date Value Ref Range Status  02/01/2021 68 >59 mL/min/1.73 Final         Failed - B12 Level in normal range and within 720 days    Vitamin B-12  Date Value Ref Range Status  05/03/2021 3,868 (H) 180 - 914 pg/mL Final    Comment:    (NOTE) This assay is not validated for testing neonatal or myeloproliferative syndrome specimens for Vitamin B12 levels. Performed at [facility], [address] [place], [place] [address]          Passed - HBA1C is between 0 and 7.9 and within 180 days    Hemoglobin A1C  Date Value Ref Range Status  01/11/2024 7.7 (A) 4.0 - 5.6 % Final   Hgb A1c MFr Bld  Date Value Ref Range Status  05/21/2023 7.8 (H) 4.8 - 5.6 % Final    Comment:             Prediabetes: 5.7 - 6.4          Diabetes: >6.4          Glycemic control for adults with diabetes: <7.0          Passed - Valid encounter within last 6 months    Recent Outpatient Visits           2 days ago Diabetes mellitus treated with insulin  and oral  medication (HCC)   [facility] at [facility], [place], MD   7 months ago Diabetes mellitus treated with insulin  and oral medication (HCC)   [facility] at [facility], MD              Passed - CBC within normal limits and completed in the last 12 months    WBC  Date Value Ref Range Status  12/18/2023 5.1 4.0 - 10.5 K/uL Final   RBC  Date Value Ref Range Status  12/18/2023 3.14 (L) 3.87 - 5.11 MIL/uL Final   Hemoglobin  Date Value Ref Range Status  12/18/2023 10.5 (L) 12.0 - 15.0 g/dL Final  98/75/7974 88.8 (L) 12.0 - 15.0 g/dL Final  98/69/7976 9.5 (L) 11.1 - 15.9 g/dL Final   HCT  Date Value Ref Range Status  12/18/2023 32.4 (L) 36.0 - 46.0 %  Final   Hematocrit  Date Value Ref Range Status  04/15/2021 29.8 (L) 34.0 - 46.6 % Final   MCHC  Date Value Ref Range Status  12/18/2023 32.4 30.0 - 36.0 g/dL Final   [facility]  Date Value Ref Range Status  12/18/2023 33.4 26.0 - 34.0 pg Final   MCV  Date Value Ref Range Status  12/18/2023 103.2 (H) 80.0 - 100.0 fL Final  04/15/2021 98 (H) 79 - 97 fL Final   No results found for: PLTCOUNTKUC, LABPLAT, POCPLA RDW  Date Value Ref Range Status  12/18/2023 15.6 (H) 11.5 - 15.5 % Final  04/15/2021 11.9 11.7 - 15.4 % Final

## 2024-01-13 NOTE — Assessment & Plan Note (Signed)
Repeat magnesium

## 2024-01-15 ENCOUNTER — Other Ambulatory Visit: Payer: Self-pay | Admitting: Internal Medicine

## 2024-01-15 ENCOUNTER — Encounter: Payer: Self-pay | Admitting: Internal Medicine

## 2024-01-15 DIAGNOSIS — E7800 Pure hypercholesterolemia, unspecified: Secondary | ICD-10-CM

## 2024-01-27 ENCOUNTER — Other Ambulatory Visit: Payer: Self-pay

## 2024-01-27 DIAGNOSIS — C9000 Multiple myeloma not having achieved remission: Secondary | ICD-10-CM

## 2024-01-27 MED ORDER — LENALIDOMIDE 10 MG PO CAPS: 10.0000 mg | ORAL_CAPSULE | Freq: Every day | ORAL | 0 refills | Status: DC

## 2024-01-29 ENCOUNTER — Inpatient Hospital Stay

## 2024-01-29 ENCOUNTER — Encounter: Payer: Self-pay | Admitting: Internal Medicine

## 2024-01-29 ENCOUNTER — Inpatient Hospital Stay: Admitting: Internal Medicine

## 2024-01-29 ENCOUNTER — Inpatient Hospital Stay: Attending: Nurse Practitioner

## 2024-01-29 VITALS — BP 134/67 | HR 66 | Temp 98.3°F | Resp 16 | Ht 64.0 in | Wt 128.2 lb

## 2024-01-29 DIAGNOSIS — C9000 Multiple myeloma not having achieved remission: Secondary | ICD-10-CM

## 2024-01-29 DIAGNOSIS — I509 Heart failure, unspecified: Secondary | ICD-10-CM

## 2024-01-29 DIAGNOSIS — E876 Hypokalemia: Secondary | ICD-10-CM | POA: Diagnosis not present

## 2024-01-29 DIAGNOSIS — R197 Diarrhea, unspecified: Secondary | ICD-10-CM | POA: Diagnosis not present

## 2024-01-29 DIAGNOSIS — D630 Anemia in neoplastic disease: Secondary | ICD-10-CM | POA: Insufficient documentation

## 2024-01-29 DIAGNOSIS — Z5112 Encounter for antineoplastic immunotherapy: Secondary | ICD-10-CM | POA: Diagnosis not present

## 2024-01-29 DIAGNOSIS — Z7984 Long term (current) use of oral hypoglycemic drugs: Secondary | ICD-10-CM | POA: Insufficient documentation

## 2024-01-29 DIAGNOSIS — C9002 Multiple myeloma in relapse: Secondary | ICD-10-CM | POA: Diagnosis not present

## 2024-01-29 DIAGNOSIS — Z7961 Long term (current) use of immunomodulator: Secondary | ICD-10-CM | POA: Insufficient documentation

## 2024-01-29 DIAGNOSIS — M7989 Other specified soft tissue disorders: Secondary | ICD-10-CM | POA: Diagnosis not present

## 2024-01-29 DIAGNOSIS — E119 Type 2 diabetes mellitus without complications: Secondary | ICD-10-CM | POA: Insufficient documentation

## 2024-01-29 DIAGNOSIS — Z79899 Other long term (current) drug therapy: Secondary | ICD-10-CM | POA: Diagnosis not present

## 2024-01-29 DIAGNOSIS — E7800 Pure hypercholesterolemia, unspecified: Secondary | ICD-10-CM | POA: Diagnosis not present

## 2024-01-29 DIAGNOSIS — R634 Abnormal weight loss: Secondary | ICD-10-CM | POA: Insufficient documentation

## 2024-01-29 DIAGNOSIS — I10 Essential (primary) hypertension: Secondary | ICD-10-CM | POA: Diagnosis not present

## 2024-01-29 DIAGNOSIS — M810 Age-related osteoporosis without current pathological fracture: Secondary | ICD-10-CM | POA: Diagnosis not present

## 2024-01-29 DIAGNOSIS — I2510 Atherosclerotic heart disease of native coronary artery without angina pectoris: Secondary | ICD-10-CM | POA: Diagnosis not present

## 2024-01-29 LAB — CBC WITH DIFFERENTIAL (CANCER CENTER ONLY)
Abs Immature Granulocytes: 0.02 K/uL (ref 0.00–0.07)
Basophils Absolute: 0.1 K/uL (ref 0.0–0.1)
Basophils Relative: 2 %
Eosinophils Absolute: 0.2 K/uL (ref 0.0–0.5)
Eosinophils Relative: 5 %
HCT: 33 % — ABNORMAL LOW (ref 36.0–46.0)
Hemoglobin: 10.6 g/dL — ABNORMAL LOW (ref 12.0–15.0)
Immature Granulocytes: 1 %
Lymphocytes Relative: 26 %
Lymphs Abs: 1 K/uL (ref 0.7–4.0)
MCH: 32.7 pg (ref 26.0–34.0)
MCHC: 32.1 g/dL (ref 30.0–36.0)
MCV: 101.9 fL — ABNORMAL HIGH (ref 80.0–100.0)
Monocytes Absolute: 0.4 K/uL (ref 0.1–1.0)
Monocytes Relative: 9 %
Neutro Abs: 2.3 K/uL (ref 1.7–7.7)
Neutrophils Relative %: 57 %
Platelet Count: 179 K/uL (ref 150–400)
RBC: 3.24 MIL/uL — ABNORMAL LOW (ref 3.87–5.11)
RDW: 13.8 % (ref 11.5–15.5)
WBC Count: 4 K/uL (ref 4.0–10.5)
nRBC: 0 % (ref 0.0–0.2)

## 2024-01-29 LAB — CMP (CANCER CENTER ONLY)
ALT: 14 U/L (ref 0–44)
AST: 19 U/L (ref 15–41)
Albumin: 4.1 g/dL (ref 3.5–5.0)
Alkaline Phosphatase: 51 U/L (ref 38–126)
Anion gap: 10 (ref 5–15)
BUN: 21 mg/dL (ref 8–23)
CO2: 21 mmol/L — ABNORMAL LOW (ref 22–32)
Calcium: 9 mg/dL (ref 8.9–10.3)
Chloride: 105 mmol/L (ref 98–111)
Creatinine: 1.13 mg/dL — ABNORMAL HIGH (ref 0.44–1.00)
GFR, Estimated: 47 mL/min — ABNORMAL LOW (ref >60)
Glucose, Bld: 206 mg/dL — ABNORMAL HIGH (ref 70–99)
Potassium: 3.7 mmol/L (ref 3.5–5.1)
Sodium: 136 mmol/L (ref 135–145)
Total Bilirubin: 0.7 mg/dL (ref 0.0–1.2)
Total Protein: 6.6 g/dL (ref 6.5–8.1)

## 2024-01-29 LAB — PRO BRAIN NATRIURETIC PEPTIDE: Pro Brain Natriuretic Peptide: 327 pg/mL — ABNORMAL HIGH (ref <300.0)

## 2024-01-29 MED ORDER — DARATUMUMAB-HYALURONIDASE-FIHJ 1800-30000 MG-UT/15ML ~~LOC~~ SOLN
1800.0000 mg | Freq: Once | SUBCUTANEOUS | Status: AC
  Administered 2024-01-29: 1800 mg via SUBCUTANEOUS
  Filled 2024-01-29: qty 15

## 2024-01-29 MED ORDER — DIPHENHYDRAMINE HCL 25 MG PO CAPS
50.0000 mg | ORAL_CAPSULE | Freq: Once | ORAL | Status: AC
  Administered 2024-01-29: 50 mg via ORAL
  Filled 2024-01-29: qty 2

## 2024-01-29 MED ORDER — ACETAMINOPHEN 325 MG PO TABS
650.0000 mg | ORAL_TABLET | Freq: Once | ORAL | Status: AC
  Administered 2024-01-29: 650 mg via ORAL
  Filled 2024-01-29: qty 2

## 2024-01-29 NOTE — Progress Notes (Signed)
 No concerns today

## 2024-01-29 NOTE — Assessment & Plan Note (Addendum)
#   STAGE I- MULTIPLE MYELOMA -Active [bone lesions; hypercalcemia; anemia; No renal insuffiencey;Bone marrow-32% plasma cell-   STANDARD Cytogenetics- NEG for FISH].  Currently on Revlimid -Dex- Dara SQ. Partial response noted; AUG  2025 M protein = NEG;  K/L RATIO= 1. 6= WNL- stable.   # Proceed with Dara SQ today + DEX 20 mg PO [at home/pre-meds] Labs today reviewed;  ALSO  Revlimid  at 10 mg dose 3 weeks on 1 week off- as diarrhea is improved.  Stable.  # Hypokalemia severe-DEC 2024  K.2.6 - improved- continue Kdur once a day-  Stable.  # Achy joints/muscle- trial of magnesium  OTC-  Stable.  #Reflux-like symptoms-  Secondary to dexamethasone ; on PPI- continue low-dose 4 mg weekly/WED.  and wants to hold off GI evaluation [per daughter]- stable.     # Anemia-moderate- multifactorial-MM/ IDA-  continue trial of gentle iron . Hb [JAn 2024]- 10-11- FEB  iron  sat-15- stable.  # Mild swelling in leg/ likely dependant/compression- no concerns of DVT- stable. on lasix  20 mg/day-stable.  # Diabetes-on oral hypoglycemic agents; on long acting insulin ; 14 units of lantus  on T/W/Thursday- Defer to PCP-stable.  # labile BP- recent hypotension- but in general HTN: continue losartan ; continue coreg ; BP- 150-160- refilled coreg ; monitor for now.stable.   # Hypercalcemia secondary to multiple myeloma- [FEB 2023-Zometa  ; calcitonin]-Zometa  q 7M- stable.   # Weight loss-s/p re-evaluation with Joli -stable.    #DVT/shingles prophylaxis: Aspirin /acyclovirr- -stable.    # Vaccinations; Ok with flu/COVID shot  # IV access:PIV   # ACP-   * Cristina Morgan- [phone] zometa -q 3 months [12/18/2023]- ; MM; K/L q 4 W  PS # DISPOSITION:  # Dara SQ;    # BNP; MM panel;K/L light chains q 4 weeks- # follow up in 4  weeks/Friday  MD: labs- cbc/cmp;possible- Dara SQ;  #  follow up in 8   weeks/Friday  MD: labs- cbc/cmp ;possible- Dara SQ;Zometa   -Dr.B

## 2024-01-29 NOTE — Progress Notes (Signed)
 [facility] CONSULT NOTE  Patient Care Team: Lemon Raisin, MD as PCP - General (Internal Medicine) Rennie Cindy SAUNDERS, MD as Consulting Physician (Oncology) Carolee Manus DASEN., MD (Ophthalmology)  CHIEF COMPLAINTS/PURPOSE OF CONSULTATION: Multiple myeloma  Oncology History Overview Note  # MULTIPLE MYELOMA  [FEB 2023-hypercalcemia status changes]-Active [bone lesions; hypercalcemia; anemia; No renal insuffiencey;Bone marrow-32% plasma cell-   STANDARD Cytogenetics].FEB 2023- S/p dexamethasone  20 mg q day x4.   # REV [15 mg -3 week-On and 1 week-OFF]-DARA; May 2023-discontinued rev 15 diarrhea/hypotension  # MAY 2023- dara-Rev 10 mg 3w/1w   # FEB 2023-hypercalcemia [[facility]-status post calcitonin; bisphosphonate]  BONE MARROW, ASPIRATE, CLOT, CORE:  -Hypercellular bone marrow with plasma cell neoplasm  -See comment   PERIPHERAL BLOOD:  -Normocytic-normochromic anemia   COMMENT:   The bone marrow is hypercellular for age with increased number of  atypical plasma cells representing 32% of all cells in the aspirate  associated with interstitial infiltrates and numerous variably sized  clusters in the clot/biopsy sections.  The plasma cells display weak  kappa light chain restriction consistent with plasma cell neoplasm.  Correlation with cytogenetic and FISH studies is recommended   MICROSCOPIC DESCRIPTION:   PERIPHERAL BLOOD SMEAR: The red blood cells display mild  anisopoikilocytosis with mild polychromasia.  The white blood cells are  normal number with scattered hypogranular neutrophils. An occasional  myelocyte and large atypical mononuclear cells are seen on scan. The  platelets are normal in number.    Multiple myeloma not having achieved remission (HCC)  05/15/2021 Initial Diagnosis   Multiple myeloma not having achieved remission (HCC)   06/27/2021 - 10/17/2021 Chemotherapy   Patient is on Treatment Plan : MYELOMA Daratumumab  SQ + Lenalidomide  +  Dexamethasone  (DaraRd) q28d     06/27/2021 -  Chemotherapy   Patient is on Treatment Plan : MYELOMA RELAPSED REFRACTORY Daratumumab  SQ + Lenalidomide  + Dexamethasone  (DaraRd) q28d     07/11/2021 Cancer Staging   Staging form: Plasma Cell Myeloma and Plasma Cell Disorders, AJCC 8th Edition - Clinical: Beta-2-microglobulin (mg/L): 2.6, Albumin (g/dL): 3.9, ISS: Stage I - Signed by Rennie Cindy SAUNDERS, MD on 07/11/2021 Stage prefix: Initial diagnosis Beta 2 microglobulin range (mg/L): Less than 3.5 Albumin range (g/dL): Greater than or equal to 3.5    HISTORY OF PRESENTING ILLNESS: pt in a wheel chair; accompanied by her daughter.   Cristina Morgan 89 y.o.  female with  multiple myeloma currently on Dara-Rev-Dex is proceed with Dara SQ today.    Discussed the use of AI scribe software for clinical note transcription with the patient, who gave verbal consent to proceed.  History of Present Illness   Cristina Morgan is an 89 year old female with multiple myeloma who presents for follow-up.  She is currently undergoing chemotherapy and feels she is doing 'pretty good' overall.   Hemoglobin level is stable at 10.6, consistent with previous readings in the range of 10 to 11.  She continues to take magnesium  and iron  supplements, as well as Revlimid  at a dose of 10 mg. There was a previous month where she did not receive her Revlimid  pills, but she has them now.  She has experienced a slight weight loss, which she attributes to watching her diet to manage cholesterol and diabetes concerns.  She recalls a past hospitalization related to her condition, where her calcium  levels were elevated and she was under the care of a spine specialist. She reflects on the difficulty of diagnosing cancer and the  challenges she faced during that time.      Review of Systems  Constitutional:  Positive for malaise/fatigue and weight loss. Negative for chills, diaphoresis and fever.  HENT:  Negative for  nosebleeds and sore throat.   Eyes:  Negative for double vision.  Respiratory:  Negative for cough, hemoptysis, sputum production, shortness of breath and wheezing.   Cardiovascular:  Negative for chest pain, palpitations, orthopnea and leg swelling.  Gastrointestinal:  Negative for abdominal pain, blood in stool, constipation, diarrhea, heartburn, melena, nausea and vomiting.  Genitourinary:  Negative for dysuria, frequency and urgency.  Musculoskeletal:  Positive for back pain and joint pain.  Skin: Negative.  Negative for itching and rash.  Neurological:  Negative for dizziness, tingling, focal weakness, weakness and headaches.  Endo/Heme/Allergies:  Does not bruise/bleed easily.  Psychiatric/Behavioral:  Negative for depression. The patient is not nervous/anxious and does not have insomnia.     MEDICAL HISTORY:  Past Medical History:  Diagnosis Date   Anemia    CAD (coronary artery disease)    Cataracts, bilateral    Closed compression fracture of first lumbar vertebra (HCC)    Closed compression fracture of second lumbar vertebra (HCC)    Diabetes mellitus without complication (HCC)    High cholesterol    History of pneumonia 2010   [place]   History of uterine fibroid    Hypercalcemia    Hyperlipidemia    Hypertension    Lytic bone lesions on xray    Multiple myeloma (HCC)    Osteoporosis    Primary osteoarthritis of right knee    SVT (supraventricular tachycardia) 05/02/2021    SURGICAL HISTORY: Past Surgical History:  Procedure Laterality Date   CATARACT EXTRACTION Bilateral 2014   COLONOSCOPY  2011   cleared for 5 yrs- [facility]   ECTOPIC PREGNANCY SURGERY      SOCIAL HISTORY: Social History   Socioeconomic History   Marital status: Married    Spouse name: Metallurgist   Number of children: 2   Years of education: Not on file   Highest education level: 12th grade  Occupational History   Occupation: Retired  Tobacco Use   Smoking status: Never    Passive  exposure: Never   Smokeless tobacco: Never   Tobacco comments:    smoking cessation materials not required  Vaping Use   Vaping status: Never Used  Substance and Sexual Activity   Alcohol use: No   Drug use: No   Sexual activity: Not Currently  Other Topics Concern   Not on file  Social History Narrative   [place] husband; . Never smoked; no alcohol. Daughters live close.    Social Drivers of Corporate Investment Banker Strain: Low Risk  (11/25/2023)   Overall Financial Resource Strain (CARDIA)    Difficulty of Paying Living Expenses: Not very hard  Food Insecurity: No Food Insecurity (11/25/2023)   Hunger Vital Sign    Worried About Running Out of Food in the Last Year: Never true    Ran Out of Food in the Last Year: Never true  Transportation Needs: No Transportation Needs (11/25/2023)   PRAPARE - Administrator, Civil Service (Medical): No    Lack of Transportation (Non-Medical): No  Physical Activity: Inactive (11/25/2023)   Exercise Vital Sign    Days of Exercise per Week: 0 days    Minutes of Exercise per Session: 0 min  Stress: No Stress Concern Present (11/25/2023)   Cristina Morgan of Occupational Health -  Occupational Stress Questionnaire    Feeling of Stress: Not at all  Social Connections: Moderately Isolated (11/25/2023)   Social Connection and Isolation Panel    Frequency of Communication with Friends and Family: More than three times a week    Frequency of Social Gatherings with Friends and Family: More than three times a week    Attends Religious Services: Never    Database Administrator or Organizations: No    Attends Banker Meetings: Never    Marital Status: Married  Catering Manager Violence: Not At Risk (11/25/2023)   Humiliation, Afraid, Rape, and Kick questionnaire    Fear of Current or Ex-Partner: No    Emotionally Abused: No    Physically Abused: No    Sexually Abused: No    FAMILY HISTORY: Family History   Problem Relation Age of Onset   Cancer Mother        breast   Heart disease Mother    Diabetes Father    Heart disease Father    Heart attack Father    Heart attack Brother    Diabetes Brother     ALLERGIES:  is allergic to latex and penicillins.  MEDICATIONS:  Current Outpatient Medications  Medication Sig Dispense Refill   Accu-Chek Softclix Lancets lancets Use as instructed 100 each 0   acetaminophen  (TYLENOL ) 325 MG tablet Take 325 mg by mouth 2 (two) times daily. START 2 days prior to chemo infusion. Take For 2 days; Do NOT take on the day of infusion     acyclovir  (ZOVIRAX ) 400 MG tablet TAKE ONE (1) TABLET BY MOUTH TWICE DAILY 60 tablet 11   aspirin  EC 81 MG tablet Take 81 mg by mouth daily. Swallow whole.     Blood Glucose Monitoring Suppl (ACCU-CHEK GUIDE) w/Device KIT USE AS DIRECTED TO CHECK GLUCOSE     Calcium  Carbonate-Vit D-Min (CALCIUM  1200 PO) Take 1 capsule by mouth daily.     carvedilol  (COREG ) 3.125 MG tablet TAKE ONE (1) TABLET BY MOUTH TWICE DAILY 60 tablet 11   dexamethasone  (DECADRON ) 4 MG tablet Take 5 tablets (20 mg total) by mouth as directed. Take one hour before monthly Darzalex  injections. 15 tablet 3   diphenoxylate -atropine  (LOMOTIL ) 2.5-0.025 MG tablet TAKE 1 TABLET BY MOUTH FOUR TIMES DAILY AS NEEDED FOR DIARRHEA OR LOOSE STOOLS 60 tablet 3   empagliflozin (JARDIANCE) 10 MG TABS tablet Take 1 tablet (10 mg total) by mouth daily. 90 tablet 1   furosemide  (LASIX ) 20 MG tablet Take 1 tablet (20 mg total) by mouth daily. 30 tablet 1   Glucerna (GLUCERNA) LIQD Take 237 mLs by mouth.     glucose blood (ACCU-CHEK GUIDE) test strip Use as instructed 100 each 0   hydrOXYzine  (ATARAX ) 10 MG tablet Take 1 tablet (10 mg total) by mouth at bedtime as needed. 90 tablet 1   lenalidomide  (REVLIMID ) 10 MG capsule Take 1 capsule (10 mg total) by mouth daily. I capsule daily for 21 days then 7 days off. 21 capsule 0   losartan  (COZAAR ) 25 MG tablet Take 1 tablet (25 mg  total) by mouth every evening. (Patient taking differently: Take 25 mg by mouth every evening. PRN) 90 tablet 1   lovastatin  (MEVACOR ) 40 MG tablet Take 1 tablet (40 mg total) by mouth every evening. 90 tablet 1   metFORMIN  (GLUCOPHAGE ) 1000 MG tablet Take 1 tablet (1,000 mg total) by mouth 2 (two) times daily. 180 tablet 0   montelukast  (SINGULAIR ) 10 MG tablet  STARTING 2 DAYS BEFORE INFUSION, TAKE 1 TABLET BY MOUTH ONCE DAILY *DO NOT TAKE ON DAY OF INFUSION* 20 tablet 11   ondansetron  (ZOFRAN ) 8 MG tablet One pill every 8 hours as needed for nausea/vomitting. 40 tablet 1   pantoprazole  (PROTONIX ) 40 MG tablet Take 1 tablet (40 mg total) by mouth daily. 90 tablet 1   potassium chloride  SA (KLOR-CON  M) 20 MEQ tablet Take 1 tablet by mouth twice daily 60 tablet 0   prochlorperazine  (COMPAZINE ) 10 MG tablet TAKE 1 TABLET BY MOUTH EVERY 6 HOURS AS NEEDED FOR NAUSEA FOR VOMITING 40 tablet 0   sertraline  (ZOLOFT ) 25 MG tablet TAKE 1 TABLET BY MOUTH DAILY 90 tablet 11   triamcinolone  (NASACORT ) 55 MCG/ACT AERO nasal inhaler 2 sprays 2 (two) times daily. (Patient taking differently: 2 sprays 2 (two) times daily. PRN)     No current facility-administered medications for this visit.    PHYSICAL EXAMINATION:  Vitals:   01/29/24 0824  BP: 134/67  Pulse: 66  Resp: 16  Temp: 98.3 F (36.8 C)  SpO2: 100%       Filed Weights   01/29/24 0824  Weight: 128 lb 3.2 oz (58.2 kg)        Physical Exam Vitals and nursing note reviewed.  HENT:     Head: Normocephalic and atraumatic.     Mouth/Throat:     Pharynx: Oropharynx is clear.  Eyes:     Extraocular Movements: Extraocular movements intact.     Pupils: Pupils are equal, round, and reactive to light.  Cardiovascular:     Rate and Rhythm: Normal rate and regular rhythm.  Pulmonary:     Comments: Decreased breath sounds bilaterally.  Abdominal:     Palpations: Abdomen is soft.  Musculoskeletal:        General: Normal range of motion.      Cervical back: Normal range of motion.  Skin:    General: Skin is warm.  Neurological:     General: No focal deficit present.     Mental Status: She is alert and oriented to person, place, and time.  Psychiatric:        Behavior: Behavior normal.        Judgment: Judgment normal.      LABORATORY DATA:  I have reviewed the data as listed Lab Results  Component Value Date   WBC 4.0 01/29/2024   HGB 10.6 (L) 01/29/2024   HCT 33.0 (L) 01/29/2024   MCV 101.9 (H) 01/29/2024   PLT 179 01/29/2024   Recent Labs    11/20/23 0821 12/18/23 0907 01/29/24 0808  NA 136 134* 136  K 3.9 3.5 3.7  CL 104 100 105  CO2 24 26 21*  GLUCOSE 182* 176* 206*  BUN 15 16 21   CREATININE 0.86 1.04* 1.13*  CALCIUM  9.4 9.0 9.0  GFRNONAA >60 52* 47*  PROT 6.5 6.5 6.6  ALBUMIN 4.0 4.0 4.1  AST 19 17 19   ALT 11 10 14   ALKPHOS 56 58 51  BILITOT 0.5 0.8 0.7    RADIOGRAPHIC STUDIES: I have personally reviewed the radiological images as listed and agreed with the findings in the report. No results found.  Multiple myeloma not having achieved remission (HCC) # STAGE I- MULTIPLE MYELOMA -Active [bone lesions; hypercalcemia; anemia; No renal insuffiencey;Bone marrow-32% plasma cell-   STANDARD Cytogenetics- NEG for FISH].  Currently on Revlimid -Dex- Dara SQ. Partial response noted; AUG  2025 M protein = NEG;  K/L RATIO= 1. 6= WNL- stable.   #  Proceed with Dara SQ today + DEX 20 mg PO [at home/pre-meds] Labs today reviewed;  ALSO  Revlimid  at 10 mg dose 3 weeks on 1 week off- as diarrhea is improved.  Stable.  # Hypokalemia severe-DEC 2024  K.2.6 - improved- continue Kdur once a day-  Stable.  # Achy joints/muscle- trial of magnesium  OTC-  Stable.  #Reflux-like symptoms-  Secondary to dexamethasone ; on PPI- continue low-dose 4 mg weekly/WED.  and wants to hold off GI evaluation [per daughter]- stable.     # Anemia-moderate- multifactorial-MM/ IDA-  continue trial of gentle iron . Hb [JAn 2024]-  10-11- FEB  iron  sat-15- stable.  # Mild swelling in leg/ likely dependant/compression- no concerns of DVT- stable. on lasix  20 mg/day-stable.  # Diabetes-on oral hypoglycemic agents; on long acting insulin ; 14 units of lantus  on T/W/Thursday- Defer to PCP-stable.  # labile BP- recent hypotension- but in general HTN: continue losartan ; continue coreg ; BP- 150-160- refilled coreg ; monitor for now.stable.   # Hypercalcemia secondary to multiple myeloma- [FEB 2023-Zometa  ; calcitonin]-Zometa  q 75M- stable.   # Weight loss-s/p re-evaluation with Joli -stable.    #DVT/shingles prophylaxis: Aspirin /acyclovirr- -stable.    # Vaccinations; Ok with flu/COVID shot  # IV access:PIV   # ACP-   * Shelanda- [phone] zometa -q 3 months [12/18/2023]- ; MM; K/L q 4 W  PS # DISPOSITION:  # Dara SQ;    # BNP; MM panel;K/L light chains q 4 weeks- # follow up in 4  weeks/Friday  MD: labs- cbc/cmp;possible- Dara SQ;  #  follow up in 8   weeks/Friday  MD: labs- cbc/cmp ;possible- Dara SQ;Zometa   -Dr.B    All questions were answered. The patient knows to call the clinic with any problems, questions or concerns.    Cindy JONELLE Joe, MD 01/31/2024 8:33 PM

## 2024-01-29 NOTE — Progress Notes (Signed)
 Nutrition Follow-up:  Patient with multiple myeloma.  Receiving dartumumab and revlimid .    Met with patient during infusion.  Reports that her appetite is pretty good.  I eat.  Likes soups, peanut butter crackers.  Last night ate pork chop with gravy, cabbage, potato.  Yesterday for lunch ate soup.  Drinks glucerna 2 times a day.    Has issues with diarrhea and takes medication to help  Medications: reviewed  Labs: reviewed  Anthropometrics:   Weight 128 lb 3.2 oz (fluid changes?)  136 lb on 10/3 135 lb 9.6 oz  on 9/5 131 lb 6/13  131 lb on 07/03/23   NUTRITION DIAGNOSIS: Inadequate oral intake -stable   INTERVENTION:  Encouraged antidiarrheal medication to help control symptoms Encouraged eating q 2-3 hours including source of protein at every meal. Reviewed foods to choose    MONITORING, EVALUATION, GOAL: weight trends, intake   NEXT VISIT: as needed  Jourdan Durbin B. Dasie SOLON, CSO, LDN Registered Dietitian [phone]

## 2024-01-31 ENCOUNTER — Encounter: Payer: Self-pay | Admitting: Internal Medicine

## 2024-02-01 LAB — MULTIPLE MYELOMA PANEL, SERUM
Albumin SerPl Elph-Mcnc: 3.6 g/dL (ref 2.9–4.4)
Albumin/Glob SerPl: 1.6 (ref 0.7–1.7)
Alpha 1: 0.2 g/dL (ref 0.0–0.4)
Alpha2 Glob SerPl Elph-Mcnc: 0.7 g/dL (ref 0.4–1.0)
B-Globulin SerPl Elph-Mcnc: 0.9 g/dL (ref 0.7–1.3)
Gamma Glob SerPl Elph-Mcnc: 0.6 g/dL (ref 0.4–1.8)
Globulin, Total: 2.4 g/dL (ref 2.2–3.9)
IgA: 106 mg/dL (ref 64–422)
IgG (Immunoglobin G), Serum: 697 mg/dL (ref 586–1602)
IgM (Immunoglobulin M), Srm: 25 mg/dL — ABNORMAL LOW (ref 26–217)
Total Protein ELP: 6 g/dL (ref 6.0–8.5)

## 2024-02-01 LAB — KAPPA/LAMBDA LIGHT CHAINS
Kappa free light chain: 20.6 mg/L — ABNORMAL HIGH (ref 3.3–19.4)
Kappa, lambda light chain ratio: 1.56 (ref 0.26–1.65)
Lambda free light chains: 13.2 mg/L (ref 5.7–26.3)

## 2024-02-09 ENCOUNTER — Encounter: Payer: Self-pay | Admitting: Student

## 2024-02-09 NOTE — Telephone Encounter (Signed)
 Please review. Appt?  KP

## 2024-02-09 NOTE — Telephone Encounter (Signed)
Appt scheduled.  KP 

## 2024-02-10 ENCOUNTER — Encounter: Payer: Self-pay | Admitting: Student

## 2024-02-10 ENCOUNTER — Ambulatory Visit: Admitting: Student

## 2024-02-10 VITALS — BP 118/58 | HR 68 | Ht 64.0 in | Wt 133.0 lb

## 2024-02-10 DIAGNOSIS — Z7984 Long term (current) use of oral hypoglycemic drugs: Secondary | ICD-10-CM

## 2024-02-10 DIAGNOSIS — I10 Essential (primary) hypertension: Secondary | ICD-10-CM | POA: Diagnosis not present

## 2024-02-10 DIAGNOSIS — E1165 Type 2 diabetes mellitus with hyperglycemia: Secondary | ICD-10-CM | POA: Diagnosis not present

## 2024-02-10 DIAGNOSIS — E119 Type 2 diabetes mellitus without complications: Secondary | ICD-10-CM

## 2024-02-10 MED ORDER — FUROSEMIDE 20 MG PO TABS: 20.0000 mg | ORAL_TABLET | Freq: Every day | ORAL | 1 refills | Status: DC

## 2024-02-10 NOTE — Assessment & Plan Note (Addendum)
 Reviewed home log systolic 110-130 and diastolic in the 50-60, this week a few readings of 45-48. Typically chest BP first think in the morning before eating. Has not taken losartan  in a few weeks as pressure have not been elevated. Reports she is not taking furosemide  although it is in her medicine bag. BP today is stable and she denies orthostatic symptoms today. However does have a wide pulse pulse pressure. May have decrease BP since starting on Jardiance . -Reiterated she should stop lasix  -hold losartan , continue coreg , if symptoms continue will discontinue coreg  - Will have her check BP when she is symptomatic to further evaluate -Encourage PO hydration

## 2024-02-10 NOTE — Assessment & Plan Note (Signed)
 Tolerating jardiance , BP somewhat lower and mucous membranes are dry on exam. Reviewed fasting glucoses in her log typically 110-150. Has been eating bedtime snack, denies hypoglycemic reading although has not been checking when she feels poorly. Denies nighttime awakenings.Will have her check glucose when she is dizzy. Discussed eating regular meals and protein intake with dinner and avoid bedtime snacking. Recommend she increase water  intake.  -Continue jardiance  and metformin 

## 2024-02-10 NOTE — Progress Notes (Addendum)
 Established Patient Office Visit  Subjective   Patient ID: Cristina Morgan, female    DOB: 1934-04-25  Age: 89 y.o. MRN: [id number]  Chief Complaint  Patient presents with   Hypertension    Cristina Morgan is a 89 y.o. person with medical hx listed below who presents today for dizziness and headache for the past few months this tis associated with ac5tivity such walking or reaching of things. Dizziness improves with sitting. Notes diastolic BP has been lower lately in the high 40s. Has not checked her BP for glucose when Cristina Morgan is symptomatic. Denies fevers, chills, weakness, LOC, falls, numbness, or tingling.   Patient Active Problem List   Diagnosis Date Noted   Chronic venous insufficiency 01/13/2024   Depression 01/13/2024   Hypokalemia 01/13/2024   Iron  deficiency anemia 01/23/2023   Multiple myeloma not having achieved remission (HCC) 05/15/2021   Hypomagnesemia 05/02/2021   Hypophosphatemia 05/02/2021   Primary osteoarthritis of right knee 12/08/2016   Diabetes (HCC) 03/20/2014   Hypercholesterolemia 03/20/2014   Essential hypertension 03/20/2014      ROS Refer to HPI    Objective:     Outpatient Encounter Medications as of 02/10/2024  Medication Sig   Accu-Chek Softclix Lancets lancets Use as instructed   acetaminophen  (TYLENOL ) 325 MG tablet Take 325 mg by mouth 2 (two) times daily. START 2 days prior to chemo infusion. Take For 2 days; Do NOT take on the day of infusion   acyclovir  (ZOVIRAX ) 400 MG tablet TAKE ONE (1) TABLET BY MOUTH TWICE DAILY   aspirin  EC 81 MG tablet Take 81 mg by mouth daily. Swallow whole.   Blood Glucose Monitoring Suppl (ACCU-CHEK GUIDE) w/Device KIT USE AS DIRECTED TO CHECK GLUCOSE   Calcium  Carbonate-Vit D-Min (CALCIUM  1200 PO) Take 1 capsule by mouth daily.   carvedilol  (COREG ) 3.125 MG tablet TAKE ONE (1) TABLET BY MOUTH TWICE DAILY   dexamethasone  (DECADRON ) 4 MG tablet Take 5 tablets (20 mg total) by mouth as directed. Take one hour  before monthly Darzalex  injections.   diphenoxylate -atropine  (LOMOTIL ) 2.5-0.025 MG tablet TAKE 1 TABLET BY MOUTH FOUR TIMES DAILY AS NEEDED FOR DIARRHEA OR LOOSE STOOLS   empagliflozin  (JARDIANCE ) 10 MG TABS tablet Take 1 tablet (10 mg total) by mouth daily.   Glucerna (GLUCERNA) LIQD Take 237 mLs by mouth.   glucose blood (ACCU-CHEK GUIDE) test strip Use as instructed   hydrOXYzine  (ATARAX ) 10 MG tablet Take 1 tablet (10 mg total) by mouth at bedtime as needed.   lenalidomide  (REVLIMID ) 10 MG capsule Take 1 capsule (10 mg total) by mouth daily. I capsule daily for 21 days then 7 days off.   losartan  (COZAAR ) 25 MG tablet Take 1 tablet (25 mg total) by mouth every evening. (Patient taking differently: Take 25 mg by mouth every evening. PRN)   lovastatin  (MEVACOR ) 40 MG tablet Take 1 tablet (40 mg total) by mouth every evening.   metFORMIN  (GLUCOPHAGE ) 1000 MG tablet Take 1 tablet (1,000 mg total) by mouth 2 (two) times daily.   montelukast  (SINGULAIR ) 10 MG tablet STARTING 2 DAYS BEFORE INFUSION, TAKE 1 TABLET BY MOUTH ONCE DAILY *DO NOT TAKE ON DAY OF INFUSION*   ondansetron  (ZOFRAN ) 8 MG tablet One pill every 8 hours as needed for nausea/vomitting.   pantoprazole  (PROTONIX ) 40 MG tablet Take 1 tablet (40 mg total) by mouth daily.   potassium chloride  SA (KLOR-CON  M) 20 MEQ tablet Take 1 tablet by mouth twice daily   prochlorperazine  (COMPAZINE ) 10  MG tablet TAKE 1 TABLET BY MOUTH EVERY 6 HOURS AS NEEDED FOR NAUSEA FOR VOMITING   sertraline  (ZOLOFT ) 25 MG tablet TAKE 1 TABLET BY MOUTH DAILY   triamcinolone  (NASACORT ) 55 MCG/ACT AERO nasal inhaler 2 sprays 2 (two) times daily. (Patient taking differently: 2 sprays 2 (two) times daily. PRN)   [DISCONTINUED] furosemide  (LASIX ) 20 MG tablet Take 1 tablet (20 mg total) by mouth daily.   [DISCONTINUED] furosemide  (LASIX ) 20 MG tablet Take 1 tablet (20 mg total) by mouth daily.   No facility-administered encounter medications on file as of 02/10/2024.     BP (!) 118/58   Pulse 68   Ht 5' 4 (1.626 m)   Wt 133 lb (60.3 kg)   SpO2 94%   BMI 22.83 kg/m  BP Readings from Last 3 Encounters:  02/10/24 (!) 118/58  01/29/24 134/67  01/11/24 138/60    Physical Exam Constitutional:      Appearance: Normal appearance.  HENT:     Head: Normocephalic and atraumatic.     Mouth/Throat:     Mouth: Mucous membranes are dry.     Pharynx: Oropharynx is clear.  Eyes:     Extraocular Movements: Extraocular movements intact.     Pupils: Pupils are equal, round, and reactive to light.  Cardiovascular:     Rate and Rhythm: Normal rate and regular rhythm.     Pulses: Normal pulses.     Heart sounds: No murmur heard. Pulmonary:     Effort: Pulmonary effort is normal.     Breath sounds: No rhonchi or rales.  Abdominal:     General: Abdomen is flat. Bowel sounds are normal. There is no distension.     Palpations: Abdomen is soft.     Tenderness: There is no abdominal tenderness.  Musculoskeletal:        General: Normal range of motion.     Right lower leg: No edema.     Left lower leg: No edema.  Skin:    Capillary Refill: Capillary refill takes less than 2 seconds.     Comments: Diminished skin turgor  Neurological:     General: No focal deficit present.     Mental Status: Cristina Morgan is alert and oriented to person, place, and time.  Psychiatric:        Mood and Affect: Mood normal.        Behavior: Behavior normal.        02/10/2024   10:14 AM 01/29/2024    8:35 AM 01/11/2024   11:10 AM  Depression screen PHQ 2/9  Decreased Interest 0 0 2  Down, Depressed, Hopeless 0 0 0  PHQ - 2 Score 0 0 2  Altered sleeping  0 0  Tired, decreased energy  1 1  Change in appetite  2 1  Feeling bad or failure about yourself   0 0  Trouble concentrating  0 2  Moving slowly or fidgety/restless  0 1  Suicidal thoughts  0 0  PHQ-9 Score  3 7   Difficult doing work/chores   Not difficult at all     Data saved with a previous flowsheet row  definition       02/10/2024   10:14 AM 01/11/2024   11:11 AM 05/21/2023    2:58 PM 01/09/2023    1:51 PM  GAD 7 : Generalized Anxiety Score  Nervous, Anxious, on Edge 0 0 0 0  Control/stop worrying 0 1 0 0  Worry too much - different things  0 0 0  Trouble relaxing  0 0 0  Restless  1 0 0  Easily annoyed or irritable  0 0   Afraid - awful might happen  0 0 0  Total GAD 7 Score  2 0   Anxiety Difficulty  Not difficult at all Not difficult at all Not difficult at all    No results found for any visits on 02/10/24.  Last CBC Lab Results  Component Value Date   WBC 4.0 01/29/2024   HGB 10.6 (L) 01/29/2024   HCT 33.0 (L) 01/29/2024   MCV 101.9 (H) 01/29/2024   MCH 32.7 01/29/2024   RDW 13.8 01/29/2024   PLT 179 01/29/2024   Last metabolic panel Lab Results  Component Value Date   GLUCOSE 206 (H) 01/29/2024   NA 136 01/29/2024   K 3.7 01/29/2024   CL 105 01/29/2024   CO2 21 (L) 01/29/2024   BUN 21 01/29/2024   CREATININE 1.13 (H) 01/29/2024   GFRNONAA 47 (L) 01/29/2024   CALCIUM  9.0 01/29/2024   PHOS 2.7 05/07/2021   PROT 6.6 01/29/2024   ALBUMIN 4.1 01/29/2024   LABGLOB 2.4 01/29/2024   AGRATIO 1.4 07/24/2020   BILITOT 0.7 01/29/2024   ALKPHOS 51 01/29/2024   AST 19 01/29/2024   ALT 14 01/29/2024   ANIONGAP 10 01/29/2024   Last lipids Lab Results  Component Value Date   CHOL 148 05/21/2023   HDL 76 05/21/2023   LDLCALC 59 05/21/2023   TRIG 61 05/21/2023   CHOLHDL 2.2 05/09/2022   Last hemoglobin A1c Lab Results  Component Value Date   HGBA1C 7.7 (A) 01/11/2024      The ASCVD Risk score (Arnett DK, et al., 2019) failed to calculate for the following reasons:   The 2019 ASCVD risk score is only valid for ages 67 to 21    Assessment & Plan:  Essential hypertension Assessment & Plan: Reviewed home log systolic 110-130 and diastolic in the 50-60, this week a few readings of 45-48. Typically chest BP first think in the morning before eating. Has not  taken losartan  in a few weeks as pressure have not been elevated. Reports Cristina Morgan is not taking furosemide  although it is in her medicine bag. BP today is stable and Cristina Morgan denies orthostatic symptoms today. However does have a wide pulse pulse pressure. May have decrease BP since starting on Jardiance . -Reiterated Cristina Morgan should stop lasix  -hold losartan , continue coreg , if symptoms continue will discontinue coreg  - Will have her check BP when Cristina Morgan is symptomatic to further evaluate -Encourage PO hydration   Long term current use of oral hypoglycemic drug  Type 2 diabetes mellitus with hyperglycemia, without long-term current use of insulin  [facility]) Assessment & Plan: Tolerating jardiance , BP somewhat lower and mucous membranes are dry on exam. Reviewed fasting glucoses in her log typically 110-150. Has been eating bedtime snack, denies hypoglycemic reading although has not been checking when Cristina Morgan feels poorly. Denies nighttime awakenings.Will have her check glucose when Cristina Morgan is dizzy. Discussed eating regular meals and protein intake with dinner and avoid bedtime snacking. Recommend Cristina Morgan increase water  intake.  -Continue jardiance  and metformin        Return in about 2 months (around 04/11/2024) for DM, HTN.    Harlene Saddler, MD

## 2024-02-17 ENCOUNTER — Ambulatory Visit: Admitting: Student

## 2024-02-22 ENCOUNTER — Other Ambulatory Visit: Payer: Self-pay

## 2024-02-22 DIAGNOSIS — K219 Gastro-esophageal reflux disease without esophagitis: Secondary | ICD-10-CM

## 2024-02-22 MED ORDER — PANTOPRAZOLE SODIUM 40 MG PO TBEC: 40.0000 mg | DELAYED_RELEASE_TABLET | Freq: Every day | ORAL | 1 refills | Status: AC

## 2024-02-26 ENCOUNTER — Ambulatory Visit
Admission: RE | Admit: 2024-02-26 | Discharge: 2024-02-26 | Disposition: A | Attending: Internal Medicine | Admitting: Internal Medicine

## 2024-02-26 ENCOUNTER — Ambulatory Visit
Admission: RE | Admit: 2024-02-26 | Discharge: 2024-02-26 | Disposition: A | Source: Ambulatory Visit | Attending: Internal Medicine

## 2024-02-26 ENCOUNTER — Inpatient Hospital Stay: Attending: Nurse Practitioner

## 2024-02-26 ENCOUNTER — Encounter: Payer: Self-pay | Admitting: Internal Medicine

## 2024-02-26 ENCOUNTER — Ambulatory Visit: Payer: Self-pay | Admitting: Internal Medicine

## 2024-02-26 ENCOUNTER — Other Ambulatory Visit: Payer: Self-pay

## 2024-02-26 ENCOUNTER — Ambulatory Visit: Admitting: Internal Medicine

## 2024-02-26 ENCOUNTER — Ambulatory Visit

## 2024-02-26 VITALS — BP 138/67 | HR 57 | Temp 97.5°F | Resp 16 | Ht 64.0 in | Wt 131.1 lb

## 2024-02-26 DIAGNOSIS — Z7982 Long term (current) use of aspirin: Secondary | ICD-10-CM | POA: Insufficient documentation

## 2024-02-26 DIAGNOSIS — R059 Cough, unspecified: Secondary | ICD-10-CM

## 2024-02-26 DIAGNOSIS — I509 Heart failure, unspecified: Secondary | ICD-10-CM

## 2024-02-26 DIAGNOSIS — C9000 Multiple myeloma not having achieved remission: Secondary | ICD-10-CM | POA: Diagnosis not present

## 2024-02-26 DIAGNOSIS — Z7984 Long term (current) use of oral hypoglycemic drugs: Secondary | ICD-10-CM | POA: Diagnosis not present

## 2024-02-26 DIAGNOSIS — R0602 Shortness of breath: Secondary | ICD-10-CM | POA: Diagnosis not present

## 2024-02-26 DIAGNOSIS — Z79624 Long term (current) use of inhibitors of nucleotide synthesis: Secondary | ICD-10-CM | POA: Insufficient documentation

## 2024-02-26 DIAGNOSIS — Z79899 Other long term (current) drug therapy: Secondary | ICD-10-CM | POA: Insufficient documentation

## 2024-02-26 DIAGNOSIS — Z7961 Long term (current) use of immunomodulator: Secondary | ICD-10-CM | POA: Diagnosis not present

## 2024-02-26 DIAGNOSIS — C9002 Multiple myeloma in relapse: Secondary | ICD-10-CM | POA: Diagnosis present

## 2024-02-26 LAB — CBC WITH DIFFERENTIAL/PLATELET
Abs Immature Granulocytes: 0.02 K/uL (ref 0.00–0.07)
Basophils Absolute: 0 K/uL (ref 0.0–0.1)
Basophils Relative: 1 %
Eosinophils Absolute: 0.2 K/uL (ref 0.0–0.5)
Eosinophils Relative: 7 %
HCT: 30.7 % — ABNORMAL LOW (ref 36.0–46.0)
Hemoglobin: 9.8 g/dL — ABNORMAL LOW (ref 12.0–15.0)
Immature Granulocytes: 1 %
Lymphocytes Relative: 37 %
Lymphs Abs: 1.2 K/uL (ref 0.7–4.0)
MCH: 32.5 pg (ref 26.0–34.0)
MCHC: 31.9 g/dL (ref 30.0–36.0)
MCV: 101.7 fL — ABNORMAL HIGH (ref 80.0–100.0)
Monocytes Absolute: 0.5 K/uL (ref 0.1–1.0)
Monocytes Relative: 14 %
Neutro Abs: 1.3 K/uL — ABNORMAL LOW (ref 1.7–7.7)
Neutrophils Relative %: 40 %
Platelets: 202 K/uL (ref 150–400)
RBC: 3.02 MIL/uL — ABNORMAL LOW (ref 3.87–5.11)
RDW: 13.2 % (ref 11.5–15.5)
WBC: 3.3 K/uL — ABNORMAL LOW (ref 4.0–10.5)
nRBC: 0 % (ref 0.0–0.2)

## 2024-02-26 LAB — CMP (CANCER CENTER ONLY)
ALT: 12 U/L (ref 0–44)
AST: 18 U/L (ref 15–41)
Albumin: 4 g/dL (ref 3.5–5.0)
Alkaline Phosphatase: 59 U/L (ref 38–126)
Anion gap: 10 (ref 5–15)
BUN: 15 mg/dL (ref 8–23)
CO2: 24 mmol/L (ref 22–32)
Calcium: 9.6 mg/dL (ref 8.9–10.3)
Chloride: 104 mmol/L (ref 98–111)
Creatinine: 1.25 mg/dL — ABNORMAL HIGH (ref 0.44–1.00)
GFR, Estimated: 42 mL/min — ABNORMAL LOW (ref >60)
Glucose, Bld: 127 mg/dL — ABNORMAL HIGH (ref 70–99)
Potassium: 4.2 mmol/L (ref 3.5–5.1)
Sodium: 138 mmol/L (ref 135–145)
Total Bilirubin: 0.4 mg/dL (ref 0.0–1.2)
Total Protein: 6.3 g/dL — ABNORMAL LOW (ref 6.5–8.1)

## 2024-02-26 LAB — PRO BRAIN NATRIURETIC PEPTIDE: Pro Brain Natriuretic Peptide: 449 pg/mL — ABNORMAL HIGH (ref <300.0)

## 2024-02-26 MED ORDER — METHYLPREDNISOLONE 4 MG PO TBPK: ORAL_TABLET | ORAL | 1 refills | Status: AC

## 2024-02-26 MED ORDER — FUROSEMIDE 20 MG PO TABS: 20.0000 mg | ORAL_TABLET | Freq: Every day | ORAL | 0 refills | Status: DC

## 2024-02-26 NOTE — Progress Notes (Signed)
 Pt having cold-like symptoms x3 weeks, sinus congestion and cough. Can she take tussinex?

## 2024-02-26 NOTE — Progress Notes (Signed)
 [facility] CONSULT NOTE  Patient Care Team: Lemon Raisin, MD as PCP - General (Internal Medicine) Rennie Cindy SAUNDERS, MD as Consulting Physician (Oncology) Carolee Manus DASEN., MD (Ophthalmology)  CHIEF COMPLAINTS/PURPOSE OF CONSULTATION: Multiple myeloma  Oncology History Overview Note  # MULTIPLE MYELOMA  [FEB 2023-hypercalcemia status changes]-Active [bone lesions; hypercalcemia; anemia; No renal insuffiencey;Bone marrow-32% plasma cell-   STANDARD Cytogenetics].FEB 2023- S/p dexamethasone  20 mg q day x4.   # REV [15 mg -3 week-On and 1 week-OFF]-DARA; May 2023-discontinued rev 15 diarrhea/hypotension  # MAY 2023- dara-Rev 10 mg 3w/1w   # FEB 2023-hypercalcemia [[facility]-status post calcitonin; bisphosphonate]  BONE MARROW, ASPIRATE, CLOT, CORE:  -Hypercellular bone marrow with plasma cell neoplasm  -See comment   PERIPHERAL BLOOD:  -Normocytic-normochromic anemia   COMMENT:   The bone marrow is hypercellular for age with increased number of  atypical plasma cells representing 32% of all cells in the aspirate  associated with interstitial infiltrates and numerous variably sized  clusters in the clot/biopsy sections.  The plasma cells display weak  kappa light chain restriction consistent with plasma cell neoplasm.  Correlation with cytogenetic and FISH studies is recommended   MICROSCOPIC DESCRIPTION:   PERIPHERAL BLOOD SMEAR: The red blood cells display mild  anisopoikilocytosis with mild polychromasia.  The white blood cells are  normal number with scattered hypogranular neutrophils. An occasional  myelocyte and large atypical mononuclear cells are seen on scan. The  platelets are normal in number.    Multiple myeloma not having achieved remission (HCC)  05/15/2021 Initial Diagnosis   Multiple myeloma not having achieved remission (HCC)   06/27/2021 - 10/17/2021 Chemotherapy   Patient is on Treatment Plan : MYELOMA Daratumumab  SQ + Lenalidomide  +  Dexamethasone  (DaraRd) q28d     06/27/2021 -  Chemotherapy   Patient is on Treatment Plan : MYELOMA RELAPSED REFRACTORY Daratumumab  SQ + Lenalidomide  + Dexamethasone  (DaraRd) q28d     07/11/2021 Cancer Staging   Staging form: Plasma Cell Myeloma and Plasma Cell Disorders, AJCC 8th Edition - Clinical: Beta-2-microglobulin (mg/L): 2.6, Albumin (g/dL): 3.9, ISS: Stage I - Signed by Rennie Cindy SAUNDERS, MD on 07/11/2021 Stage prefix: Initial diagnosis Beta 2 microglobulin range (mg/L): Less than 3.5 Albumin range (g/dL): Greater than or equal to 3.5    HISTORY OF PRESENTING ILLNESS: pt in a wheel chair; accompanied by her daughter.   Cristina Morgan 89 y.o.  female with  multiple myeloma currently on Dara-Rev-Dex is proceed with Dara SQ today.    Discussed the use of AI scribe software for clinical note transcription with the patient, who gave verbal consent to proceed.  History of Present Illness   Cristina Morgan is an 89 year old female with multiple myeloma undergoing chemotherapy who presents with persistent upper respiratory symptoms and cough.  She is currently receiving Revlimid  and periodic corticosteroids for multiple myeloma, but chemotherapy was held at this visit due to acute illness.  For the past three weeks, she has experienced persistent nasal congestion and a non-productive cough, which began after exposure to ill family members. She describes dyspnea and ongoing congestion, but denies fever, sputum production, nausea, vomiting, abdominal pain, wheezing, or peripheral edema.  Her current medications include prednisone, and she took dexamethasone  this morning. Lasix  was previously discontinued due to hypotension after starting Jardiance , but her blood pressure is now acceptable per family.        Review of Systems  Constitutional:  Positive for malaise/fatigue and weight loss. Negative for chills, diaphoresis and fever.  HENT:  Negative for nosebleeds and sore throat.    Eyes:  Negative for double vision.  Respiratory:  Negative for cough, hemoptysis, sputum production, shortness of breath and wheezing.   Cardiovascular:  Negative for chest pain, palpitations, orthopnea and leg swelling.  Gastrointestinal:  Negative for abdominal pain, blood in stool, constipation, diarrhea, heartburn, melena, nausea and vomiting.  Genitourinary:  Negative for dysuria, frequency and urgency.  Musculoskeletal:  Positive for back pain and joint pain.  Skin: Negative.  Negative for itching and rash.  Neurological:  Negative for dizziness, tingling, focal weakness, weakness and headaches.  Endo/Heme/Allergies:  Does not bruise/bleed easily.  Psychiatric/Behavioral:  Negative for depression. The patient is not nervous/anxious and does not have insomnia.     MEDICAL HISTORY:  Past Medical History:  Diagnosis Date   Anemia    CAD (coronary artery disease)    Cataracts, bilateral    Closed compression fracture of first lumbar vertebra (HCC)    Closed compression fracture of second lumbar vertebra (HCC)    Diabetes mellitus without complication (HCC)    High cholesterol    History of pneumonia 2010   [employer]   History of uterine fibroid    Hypercalcemia    Hyperlipidemia    Hypertension    Lytic bone lesions on xray    Multiple myeloma (HCC)    Osteoporosis    Primary osteoarthritis of right knee    SVT (supraventricular tachycardia) 05/02/2021    SURGICAL HISTORY: Past Surgical History:  Procedure Laterality Date   CATARACT EXTRACTION Bilateral 2014   COLONOSCOPY  2011   cleared for 5 yrs- [facility]   ECTOPIC PREGNANCY SURGERY      SOCIAL HISTORY: Social History   Socioeconomic History   Marital status: Married    Spouse name: Metallurgist   Number of children: 2   Years of education: Not on file   Highest education level: 12th grade  Occupational History   Occupation: Retired  Tobacco Use   Smoking status: Never    Passive exposure: Never   Smokeless  tobacco: Never   Tobacco comments:    smoking cessation materials not required  Vaping Use   Vaping status: Never Used  Substance and Sexual Activity   Alcohol use: No   Drug use: No   Sexual activity: Not Currently  Other Topics Concern   Not on file  Social History Narrative   [place] husband; . Never smoked; no alcohol. Daughters live close.    Social Drivers of Health   Tobacco Use: Low Risk (02/26/2024)   Patient History    Smoking Tobacco Use: Never    Smokeless Tobacco Use: Never    Passive Exposure: Never  Financial Resource Strain: Low Risk (11/25/2023)   Overall Financial Resource Strain (CARDIA)    Difficulty of Paying Living Expenses: Not very hard  Food Insecurity: No Food Insecurity (11/25/2023)   Epic    Worried About Programme Researcher, Broadcasting/film/video in the Last Year: Never true    Ran Out of Food in the Last Year: Never true  Transportation Needs: No Transportation Needs (11/25/2023)   Epic    Lack of Transportation (Medical): No    Lack of Transportation (Non-Medical): No  Physical Activity: Inactive (11/25/2023)   Exercise Vital Sign    Days of Exercise per Week: 0 days    Minutes of Exercise per Session: 0 min  Stress: No Stress Concern Present (11/25/2023)   [employer] of Occupational Health - Occupational Stress Questionnaire  Feeling of Stress: Not at all  Social Connections: Moderately Isolated (11/25/2023)   Social Connection and Isolation Panel    Frequency of Communication with Friends and Family: More than three times a week    Frequency of Social Gatherings with Friends and Family: More than three times a week    Attends Religious Services: Never    Database Administrator or Organizations: No    Attends Banker Meetings: Never    Marital Status: Married  Catering Manager Violence: Not At Risk (11/25/2023)   Epic    Fear of Current or Ex-Partner: No    Emotionally Abused: No    Physically Abused: No    Sexually Abused: No   Depression (PHQ2-9): Medium Risk (02/26/2024)   Depression (PHQ2-9)    PHQ-2 Score: 5  Alcohol Screen: Low Risk (11/25/2023)   Alcohol Screen    Last Alcohol Screening Score (AUDIT): 0  Housing: Low Risk (11/25/2023)   Epic    Unable to Pay for Housing in the Last Year: No    Number of Times Moved in the Last Year: 0    Homeless in the Last Year: No  Utilities: Not At Risk (11/25/2023)   Epic    Threatened with loss of utilities: No  Health Literacy: Adequate Health Literacy (11/25/2023)   B1300 Health Literacy    Frequency of need for help with medical instructions: Never    FAMILY HISTORY: Family History  Problem Relation Age of Onset   Cancer Mother        breast   Heart disease Mother    Diabetes Father    Heart disease Father    Heart attack Father    Heart attack Brother    Diabetes Brother     ALLERGIES:  is allergic to latex and penicillins.  MEDICATIONS:  Current Outpatient Medications  Medication Sig Dispense Refill   Accu-Chek Softclix Lancets lancets Use as instructed 100 each 0   acetaminophen  (TYLENOL ) 325 MG tablet Take 325 mg by mouth 2 (two) times daily. START 2 days prior to chemo infusion. Take For 2 days; Do NOT take on the day of infusion     acyclovir  (ZOVIRAX ) 400 MG tablet TAKE ONE (1) TABLET BY MOUTH TWICE DAILY 60 tablet 11   aspirin  EC 81 MG tablet Take 81 mg by mouth daily. Swallow whole.     Blood Glucose Monitoring Suppl (ACCU-CHEK GUIDE) w/Device KIT USE AS DIRECTED TO CHECK GLUCOSE     Calcium  Carbonate-Vit D-Min (CALCIUM  1200 PO) Take 1 capsule by mouth daily.     carvedilol  (COREG ) 3.125 MG tablet TAKE ONE (1) TABLET BY MOUTH TWICE DAILY 60 tablet 11   dexamethasone  (DECADRON ) 4 MG tablet Take 5 tablets (20 mg total) by mouth as directed. Take one hour before monthly Darzalex  injections. 15 tablet 3   diphenoxylate -atropine  (LOMOTIL ) 2.5-0.025 MG tablet TAKE 1 TABLET BY MOUTH FOUR TIMES DAILY AS NEEDED FOR DIARRHEA OR LOOSE STOOLS 60 tablet  3   empagliflozin  (JARDIANCE ) 10 MG TABS tablet Take 1 tablet (10 mg total) by mouth daily. 90 tablet 1   furosemide  (LASIX ) 20 MG tablet Take 1 tablet (20 mg total) by mouth daily. 7 tablet 0   Glucerna (GLUCERNA) LIQD Take 237 mLs by mouth.     glucose blood (ACCU-CHEK GUIDE) test strip Use as instructed 100 each 0   hydrOXYzine  (ATARAX ) 10 MG tablet Take 1 tablet (10 mg total) by mouth at bedtime as needed. 90 tablet 1  lenalidomide  (REVLIMID ) 10 MG capsule Take 1 capsule (10 mg total) by mouth daily. I capsule daily for 21 days then 7 days off. 21 capsule 0   losartan  (COZAAR ) 25 MG tablet Take 1 tablet (25 mg total) by mouth every evening. (Patient taking differently: Take 25 mg by mouth every evening. PRN) 90 tablet 1   lovastatin  (MEVACOR ) 40 MG tablet Take 1 tablet (40 mg total) by mouth every evening. 90 tablet 1   metFORMIN  (GLUCOPHAGE ) 1000 MG tablet Take 1 tablet (1,000 mg total) by mouth 2 (two) times daily. 180 tablet 0   methylPREDNISolone  (MEDROL  DOSEPAK) 4 MG TBPK tablet Use as directed. 21 tablet 1   montelukast  (SINGULAIR ) 10 MG tablet STARTING 2 DAYS BEFORE INFUSION, TAKE 1 TABLET BY MOUTH ONCE DAILY *DO NOT TAKE ON DAY OF INFUSION* 20 tablet 11   ondansetron  (ZOFRAN ) 8 MG tablet One pill every 8 hours as needed for nausea/vomitting. 40 tablet 1   pantoprazole  (PROTONIX ) 40 MG tablet Take 1 tablet (40 mg total) by mouth daily. 90 tablet 1   potassium chloride  SA (KLOR-CON  M) 20 MEQ tablet Take 1 tablet by mouth twice daily 60 tablet 0   prochlorperazine  (COMPAZINE ) 10 MG tablet TAKE 1 TABLET BY MOUTH EVERY 6 HOURS AS NEEDED FOR NAUSEA FOR VOMITING 40 tablet 0   sertraline  (ZOLOFT ) 25 MG tablet TAKE 1 TABLET BY MOUTH DAILY 90 tablet 11   triamcinolone  (NASACORT ) 55 MCG/ACT AERO nasal inhaler 2 sprays 2 (two) times daily. (Patient taking differently: 2 sprays 2 (two) times daily. PRN)     No current facility-administered medications for this visit.    PHYSICAL  EXAMINATION:  Vitals:   02/26/24 0850  BP: 138/67  Pulse: (!) 57  Resp: 16  Temp: (!) 97.5 F (36.4 C)  SpO2: 96%       Filed Weights   02/26/24 0850  Weight: 131 lb 1.6 oz (59.5 kg)        Physical Exam Vitals and nursing note reviewed.  HENT:     Head: Normocephalic and atraumatic.     Mouth/Throat:     Pharynx: Oropharynx is clear.  Eyes:     Extraocular Movements: Extraocular movements intact.     Pupils: Pupils are equal, round, and reactive to light.  Cardiovascular:     Rate and Rhythm: Normal rate and regular rhythm.  Pulmonary:     Comments: Decreased breath sounds bilaterally.  Abdominal:     Palpations: Abdomen is soft.  Musculoskeletal:        General: Normal range of motion.     Cervical back: Normal range of motion.  Skin:    General: Skin is warm.  Neurological:     General: No focal deficit present.     Mental Status: She is alert and oriented to person, place, and time.  Psychiatric:        Behavior: Behavior normal.        Judgment: Judgment normal.      LABORATORY DATA:  I have reviewed the data as listed Lab Results  Component Value Date   WBC 3.3 (L) 02/26/2024   HGB 9.8 (L) 02/26/2024   HCT 30.7 (L) 02/26/2024   MCV 101.7 (H) 02/26/2024   PLT 202 02/26/2024   Recent Labs    12/18/23 0907 01/29/24 0808 02/26/24 0847  NA 134* 136 138  K 3.5 3.7 4.2  CL 100 105 104  CO2 26 21* 24  GLUCOSE 176* 206* 127*  BUN 16 21  15  CREATININE 1.04* 1.13* 1.25*  CALCIUM  9.0 9.0 9.6  GFRNONAA 52* 47* 42*  PROT 6.5 6.6 6.3*  ALBUMIN 4.0 4.1 4.0  AST 17 19 18   ALT 10 14 12   ALKPHOS 58 51 59  BILITOT 0.8 0.7 0.4    RADIOGRAPHIC STUDIES: I have personally reviewed the radiological images as listed and agreed with the findings in the report. No results found.  Multiple myeloma not having achieved remission (HCC) # STAGE I- MULTIPLE MYELOMA -Active [bone lesions; hypercalcemia; anemia; No renal insuffiencey;Bone marrow-32% plasma  cell-   STANDARD Cytogenetics- NEG for FISH].  Currently on Revlimid -Dex- Dara SQ. Partial response noted; AUG  2025 M protein = NEG;  K/L RATIO= 1. 6= WNL- stable.   # IVEN COUGH/DYSPNEA HOLD Dara SQ today + DEX 20 mg PO [at home/pre-meds] Labs today reviewed;  ALSO  Revlimid  at 10 mg dose 3 weeks on 1 week off- as diarrhea is improved.  Stable.  # sinus congestion/dsypnea- cough- increased BNP- recommend check CXR- # re-start lasix   20 mg/day- x1 w- start medrol  dose pack- HOLD anti-biotics.   # Hypokalemia severe-DEC 2024  K.2.6 - improved- continue Kdur once a day-  Stable.  # Achy joints/muscle- trial of magnesium  OTC-  Stable.  #Reflux-like symptoms-  Secondary to dexamethasone ; on PPI- continue low-dose 4 mg weekly/WED.  and wants to hold off GI evaluation [per daughter]- stable.     # Anemia-moderate- multifactorial-MM/ IDA-  continue trial of gentle iron . Hb [JAn 2024]- 10-11- FEB  iron  sat-15-stable.     # Diabetes-on oral hypoglycemic agents; on long acting insulin ; 14 units of lantus  on T/W/Thursday- Defer to PCPstable.     # labile BP- recent hypotension- but in general HTN: continue losartan ; continue coreg ; BP- 150-160- refilled coreg ; monitor for now.stable.   # Hypercalcemia secondary to multiple myeloma- [FEB 2023-Zometa  ; calcitonin]-Zometa  q 23M- stable.   # Weight loss-s/p re-evaluation with Joli -stable.    #DVT/shingles prophylaxis: Aspirin /acyclovirr- -stable.    # Vaccinations; Ok with flu/COVID shot  # IV access:PIV   # ACP-   * [place]landa- [phone] zometa -q 3 months [12/18/2023]- ; MM; K/L q 4 W  PS # DISPOSITION:  # HOLD Dara SQ;    # BNP; MM panel;K/L light chains q 4 weeks- # CXR today-  # follow up in 4  weeks/Friday  MD: labs- cbc/cmp;possible- Dara SQ;  #  follow up in 8   weeks/Friday  MD: labs- cbc/cmp ;possible- Dara SQ;Zometa   -Dr.B      All questions were answered. The patient knows to call the clinic with any problems, questions or  concerns.    Cindy JONELLE Joe, MD 02/26/2024 10:25 AM

## 2024-02-26 NOTE — Assessment & Plan Note (Addendum)
#   STAGE I- MULTIPLE MYELOMA -Active [bone lesions; hypercalcemia; anemia; No renal insuffiencey;Bone marrow-32% plasma cell-   STANDARD Cytogenetics- NEG for FISH].  Currently on Revlimid -Dex- Dara SQ. Partial response noted; AUG  2025 M protein = NEG;  K/L RATIO= 1. 6= WNL- stable.   # IVEN COUGH/DYSPNEA HOLD Dara SQ today + DEX 20 mg PO [at home/pre-meds] Labs today reviewed;  ALSO  Revlimid  at 10 mg dose 3 weeks on 1 week off- as diarrhea is improved.  Stable.  # sinus congestion/dsypnea- cough- increased BNP- recommend check CXR- # re-start lasix   20 mg/day- x1 w- start medrol  dose pack- HOLD anti-biotics.   # Hypokalemia severe-DEC 2024  K.2.6 - improved- continue Kdur once a day-  Stable.  # Achy joints/muscle- trial of magnesium  OTC-  Stable.  #Reflux-like symptoms-  Secondary to dexamethasone ; on PPI- continue low-dose 4 mg weekly/WED.  and wants to hold off GI evaluation [per daughter]- stable.     # Anemia-moderate- multifactorial-MM/ IDA-  continue trial of gentle iron . Hb [JAn 2024]- 10-11- FEB  iron  sat-15-stable.     # Diabetes-on oral hypoglycemic agents; on long acting insulin ; 14 units of lantus  on T/W/Thursday- Defer to PCPstable.     # labile BP- recent hypotension- but in general HTN: continue losartan ; continue coreg ; BP- 150-160- refilled coreg ; monitor for now.stable.   # Hypercalcemia secondary to multiple myeloma- [FEB 2023-Zometa  ; calcitonin]-Zometa  q 80M- stable.   # Weight loss-s/p re-evaluation with Joli -stable.    #DVT/shingles prophylaxis: Aspirin /acyclovirr- -stable.    # Vaccinations; Ok with flu/COVID shot  # IV access:PIV   # ACP-   * [place]- [phone] zometa -q 3 months [12/18/2023]- ; MM; K/L q 4 W  PS # DISPOSITION:  # HOLD Dara SQ;    # BNP; MM panel;K/L light chains q 4 weeks- # CXR today-  # follow up in 4  weeks/Friday  MD: labs- cbc/cmp;possible- Dara SQ;  #  follow up in 8   weeks/Friday  MD: labs- cbc/cmp ;possible- Dara  SQ;Zometa   -Dr.B

## 2024-02-29 ENCOUNTER — Other Ambulatory Visit: Payer: Self-pay | Admitting: *Deleted

## 2024-02-29 DIAGNOSIS — C9000 Multiple myeloma not having achieved remission: Secondary | ICD-10-CM

## 2024-02-29 LAB — KAPPA/LAMBDA LIGHT CHAINS
Kappa free light chain: 21.8 mg/L — ABNORMAL HIGH (ref 3.3–19.4)
Kappa, lambda light chain ratio: 1.42 (ref 0.26–1.65)
Lambda free light chains: 15.4 mg/L (ref 5.7–26.3)

## 2024-02-29 MED ORDER — LENALIDOMIDE 10 MG PO CAPS: 10.0000 mg | ORAL_CAPSULE | Freq: Every day | ORAL | 0 refills | Status: DC

## 2024-03-01 LAB — MULTIPLE MYELOMA PANEL, SERUM
Albumin SerPl Elph-Mcnc: 3.3 g/dL (ref 2.9–4.4)
Albumin/Glob SerPl: 1.4 (ref 0.7–1.7)
Alpha 1: 0.2 g/dL (ref 0.0–0.4)
Alpha2 Glob SerPl Elph-Mcnc: 0.7 g/dL (ref 0.4–1.0)
B-Globulin SerPl Elph-Mcnc: 0.8 g/dL (ref 0.7–1.3)
Gamma Glob SerPl Elph-Mcnc: 0.6 g/dL (ref 0.4–1.8)
Globulin, Total: 2.4 g/dL (ref 2.2–3.9)
IgA: 105 mg/dL (ref 64–422)
IgG (Immunoglobin G), Serum: 691 mg/dL (ref 586–1602)
IgM (Immunoglobulin M), Srm: 20 mg/dL — ABNORMAL LOW (ref 26–217)
M Protein SerPl Elph-Mcnc: 0.1 g/dL — ABNORMAL HIGH
Total Protein ELP: 5.7 g/dL — ABNORMAL LOW (ref 6.0–8.5)

## 2024-03-25 ENCOUNTER — Encounter: Payer: Self-pay | Admitting: Internal Medicine

## 2024-03-25 ENCOUNTER — Inpatient Hospital Stay: Attending: Nurse Practitioner

## 2024-03-25 ENCOUNTER — Inpatient Hospital Stay

## 2024-03-25 ENCOUNTER — Inpatient Hospital Stay: Admitting: Internal Medicine

## 2024-03-25 ENCOUNTER — Other Ambulatory Visit: Payer: Self-pay | Admitting: Internal Medicine

## 2024-03-25 VITALS — BP 152/50 | HR 61

## 2024-03-25 VITALS — BP 123/50 | HR 69 | Temp 97.5°F | Resp 17 | Wt 132.0 lb

## 2024-03-25 DIAGNOSIS — D649 Anemia, unspecified: Secondary | ICD-10-CM | POA: Insufficient documentation

## 2024-03-25 DIAGNOSIS — Z7409 Other reduced mobility: Secondary | ICD-10-CM | POA: Insufficient documentation

## 2024-03-25 DIAGNOSIS — Z7961 Long term (current) use of immunomodulator: Secondary | ICD-10-CM | POA: Insufficient documentation

## 2024-03-25 DIAGNOSIS — R634 Abnormal weight loss: Secondary | ICD-10-CM | POA: Insufficient documentation

## 2024-03-25 DIAGNOSIS — I509 Heart failure, unspecified: Secondary | ICD-10-CM

## 2024-03-25 DIAGNOSIS — C9000 Multiple myeloma not having achieved remission: Secondary | ICD-10-CM

## 2024-03-25 DIAGNOSIS — E8770 Fluid overload, unspecified: Secondary | ICD-10-CM | POA: Insufficient documentation

## 2024-03-25 DIAGNOSIS — Z5112 Encounter for antineoplastic immunotherapy: Secondary | ICD-10-CM | POA: Insufficient documentation

## 2024-03-25 DIAGNOSIS — I10 Essential (primary) hypertension: Secondary | ICD-10-CM | POA: Insufficient documentation

## 2024-03-25 DIAGNOSIS — E119 Type 2 diabetes mellitus without complications: Secondary | ICD-10-CM | POA: Insufficient documentation

## 2024-03-25 DIAGNOSIS — Z79899 Other long term (current) drug therapy: Secondary | ICD-10-CM | POA: Insufficient documentation

## 2024-03-25 DIAGNOSIS — E876 Hypokalemia: Secondary | ICD-10-CM | POA: Insufficient documentation

## 2024-03-25 DIAGNOSIS — R5383 Other fatigue: Secondary | ICD-10-CM | POA: Insufficient documentation

## 2024-03-25 DIAGNOSIS — Z794 Long term (current) use of insulin: Secondary | ICD-10-CM | POA: Insufficient documentation

## 2024-03-25 DIAGNOSIS — E7800 Pure hypercholesterolemia, unspecified: Secondary | ICD-10-CM | POA: Insufficient documentation

## 2024-03-25 DIAGNOSIS — R059 Cough, unspecified: Secondary | ICD-10-CM | POA: Insufficient documentation

## 2024-03-25 LAB — CBC WITH DIFFERENTIAL/PLATELET
Abs Immature Granulocytes: 0.01 K/uL (ref 0.00–0.07)
Basophils Absolute: 0.1 K/uL (ref 0.0–0.1)
Basophils Relative: 2 %
Eosinophils Absolute: 0.3 K/uL (ref 0.0–0.5)
Eosinophils Relative: 7 %
HCT: 31 % — ABNORMAL LOW (ref 36.0–46.0)
Hemoglobin: 9.7 g/dL — ABNORMAL LOW (ref 12.0–15.0)
Immature Granulocytes: 0 %
Lymphocytes Relative: 33 %
Lymphs Abs: 1.3 K/uL (ref 0.7–4.0)
MCH: 31.9 pg (ref 26.0–34.0)
MCHC: 31.3 g/dL (ref 30.0–36.0)
MCV: 102 fL — ABNORMAL HIGH (ref 80.0–100.0)
Monocytes Absolute: 0.6 K/uL (ref 0.1–1.0)
Monocytes Relative: 15 %
Neutro Abs: 1.7 K/uL (ref 1.7–7.7)
Neutrophils Relative %: 43 %
Platelets: 188 K/uL (ref 150–400)
RBC: 3.04 MIL/uL — ABNORMAL LOW (ref 3.87–5.11)
RDW: 14 % (ref 11.5–15.5)
WBC: 3.9 K/uL — ABNORMAL LOW (ref 4.0–10.5)
nRBC: 0 % (ref 0.0–0.2)

## 2024-03-25 LAB — CMP (CANCER CENTER ONLY)
ALT: 12 U/L (ref 0–44)
AST: 17 U/L (ref 15–41)
Albumin: 3.8 g/dL (ref 3.5–5.0)
Alkaline Phosphatase: 62 U/L (ref 38–126)
Anion gap: 11 (ref 5–15)
BUN: 17 mg/dL (ref 8–23)
CO2: 22 mmol/L (ref 22–32)
Calcium: 9.2 mg/dL (ref 8.9–10.3)
Chloride: 107 mmol/L (ref 98–111)
Creatinine: 1.15 mg/dL — ABNORMAL HIGH (ref 0.44–1.00)
GFR, Estimated: 46 mL/min — ABNORMAL LOW
Glucose, Bld: 191 mg/dL — ABNORMAL HIGH (ref 70–99)
Potassium: 3.7 mmol/L (ref 3.5–5.1)
Sodium: 139 mmol/L (ref 135–145)
Total Bilirubin: 0.4 mg/dL (ref 0.0–1.2)
Total Protein: 6 g/dL — ABNORMAL LOW (ref 6.5–8.1)

## 2024-03-25 LAB — PRO BRAIN NATRIURETIC PEPTIDE: Pro Brain Natriuretic Peptide: 551 pg/mL — ABNORMAL HIGH

## 2024-03-25 MED ORDER — FUROSEMIDE 20 MG PO TABS: 20.0000 mg | ORAL_TABLET | Freq: Every day | ORAL | 3 refills | Status: AC

## 2024-03-25 MED ORDER — DARATUMUMAB-HYALURONIDASE-FIHJ 1800-30000 MG-UT/15ML ~~LOC~~ SOLN
1800.0000 mg | Freq: Once | SUBCUTANEOUS | Status: AC
  Administered 2024-03-25: 1800 mg via SUBCUTANEOUS
  Filled 2024-03-25: qty 15

## 2024-03-25 MED ORDER — ACETAMINOPHEN 325 MG PO TABS
650.0000 mg | ORAL_TABLET | Freq: Once | ORAL | Status: AC
  Administered 2024-03-25: 650 mg via ORAL
  Filled 2024-03-25: qty 2

## 2024-03-25 MED ORDER — DIPHENHYDRAMINE HCL 25 MG PO TABS
50.0000 mg | ORAL_TABLET | Freq: Once | ORAL | Status: AC
  Administered 2024-03-25: 50 mg via ORAL
  Filled 2024-03-25: qty 2

## 2024-03-25 NOTE — Assessment & Plan Note (Addendum)
#   STAGE I- MULTIPLE MYELOMA -Active [bone lesions; hypercalcemia; anemia; No renal insuffiencey;Bone marrow-32% plasma cell-   STANDARD Cytogenetics- NEG for FISH].  Currently on Revlimid -Dex- Dara SQ. Partial response noted; AUG  2025 M protein = NEG;  K/L RATIO= 1. 6= WNL- stable.   # ok to re-start Dara SQ today + DEX 20 mg PO [at home/pre-meds] Labs today reviewed;  Continue Revlimid  at 10 mg dose 3 weeks on 1 week off- as diarrhea is improved.  Stable.  # # labile BP- recent hypotension- but in general HTN: continue losartan ; continue coreg ; BP- 150-160- refilled coreg ; monitor for now.stable.  Mild fluid overload- Increased BNP- continue lasix  20 mg/day. Refer to [facility] cardiology.   # Hypokalemia severe-DEC 2024  K.2.6 - improved- continue Kdur once a day-  Stable.  # Achy joints/muscle- trial of magnesium  OTC-  Stable.  #Reflux-like symptoms-  Secondary to dexamethasone ; on PPI- continue low-dose 4 mg weekly/WED.  and wants to hold off GI evaluation [per daughter]- stable.     # Anemia-moderate- multifactorial-MM/ IDA-  continue trial of gentle iron . Hb [JAn 2024]- 10-11- FEB  iron  sat-15-stable.     # Diabetes-on oral hypoglycemic agents; on long acting insulin ; 14 units of lantus  on T/W/Thursday- Defer to PCPstable.     # Hypercalcemia secondary to multiple myeloma- [FEB 2023-Zometa  ; calcitonin]-Zometa  q 70M- stable.   # Weight loss-s/p re-evaluation with Joli -stable.    #DVT/shingles prophylaxis: Aspirin /acyclovirr- -stable.    # Vaccinations; Ok with flu/COVID shot  # IV access:PIV   # ACP-   * [place]a- [phone] zometa -q 3 months [12/18/2023]- ; MM; K/L q 4 W  PS # DISPOSITION:  #  Refer to [facility] cardiology re: fluid overload.  #  Dara SQ;    # BNP; MM panel;K/L light chains q 4 weeks- # follow up in 4  weeks/Friday  MD: labs- cbc/cmp;possible- Dara SQ; Zometa  #  follow up in 8   weeks/Friday  MD: labs- cbc/cmp ;possible- Dara SQ;  -Dr.B

## 2024-03-25 NOTE — Progress Notes (Signed)
 Doing well. Does have some lingering congestion/fluid in throat. Asking for another round of fluid pill.

## 2024-03-25 NOTE — Progress Notes (Signed)
 [facility] CONSULT NOTE  Patient Care Team: Lemon Raisin, MD as PCP - General (Internal Medicine) Rennie Cindy SAUNDERS, MD as Consulting Physician (Oncology) Carolee Manus DASEN., MD (Ophthalmology)  CHIEF COMPLAINTS/PURPOSE OF CONSULTATION: Multiple myeloma  Oncology History Overview Note  # MULTIPLE MYELOMA  [FEB 2023-hypercalcemia status changes]-Active [bone lesions; hypercalcemia; anemia; No renal insuffiencey;Bone marrow-32% plasma cell-   STANDARD Cytogenetics].FEB 2023- S/p dexamethasone  20 mg q day x4.   # REV [15 mg -3 week-On and 1 week-OFF]-DARA; May 2023-discontinued rev 15 diarrhea/hypotension  # MAY 2023- dara-Rev 10 mg 3w/1w   # FEB 2023-hypercalcemia [[facility]-status post calcitonin; bisphosphonate]  BONE MARROW, ASPIRATE, CLOT, CORE:  -Hypercellular bone marrow with plasma cell neoplasm  -See comment   PERIPHERAL BLOOD:  -Normocytic-normochromic anemia   COMMENT:   The bone marrow is hypercellular for age with increased number of  atypical plasma cells representing 32% of all cells in the aspirate  associated with interstitial infiltrates and numerous variably sized  clusters in the clot/biopsy sections.  The plasma cells display weak  kappa light chain restriction consistent with plasma cell neoplasm.  Correlation with cytogenetic and FISH studies is recommended   MICROSCOPIC DESCRIPTION:   PERIPHERAL BLOOD SMEAR: The red blood cells display mild  anisopoikilocytosis with mild polychromasia.  The white blood cells are  normal number with scattered hypogranular neutrophils. An occasional  myelocyte and large atypical mononuclear cells are seen on scan. The  platelets are normal in number.    Multiple myeloma not having achieved remission (HCC)  05/15/2021 Initial Diagnosis   Multiple myeloma not having achieved remission (HCC)   06/27/2021 - 10/17/2021 Chemotherapy   Patient is on Treatment Plan : MYELOMA Daratumumab  SQ + Lenalidomide  +  Dexamethasone  (DaraRd) q28d     06/27/2021 -  Chemotherapy   Patient is on Treatment Plan : MYELOMA RELAPSED REFRACTORY Daratumumab  SQ + Lenalidomide  + Dexamethasone  (DaraRd) q28d     07/11/2021 Cancer Staging   Staging form: Plasma Cell Myeloma and Plasma Cell Disorders, AJCC 8th Edition - Clinical: Beta-2-microglobulin (mg/L): 2.6, Albumin (g/dL): 3.9, ISS: Stage I - Signed by Rennie Cindy SAUNDERS, MD on 07/11/2021 Stage prefix: Initial diagnosis Beta 2 microglobulin range (mg/L): Less than 3.5 Albumin range (g/dL): Greater than or equal to 3.5    HISTORY OF PRESENTING ILLNESS: pt in a wheel chair; accompanied by her daughter.   Cathlean RAYMOND Daring 89 y.o.  female with  multiple myeloma currently on Dara-Rev-Dex is proceed with Dara SQ today.     Discussed the use of AI scribe software for clinical note transcription with the patient, who gave verbal consent to proceed.  History of Present Illness   STEFFANIE MINGLE is an 89 year old female with multiple myeloma on maintenance therapy who presents for follow-up regarding fluid overload.  She remains on maintenance therapy with daratumumab  and lenalidomide  for multiple myeloma, with her most recent cycle held due to an upper respiratory infection. She continues to have mild residual symptoms, including throat clearing and occasional cough, which have improved compared to prior weeks.  She reports ongoing fluid retention and expresses a need for additional diuretic therapy. She experiences some difficulty with mobility and mild dyspnea, but denies current lower extremity edema. She wears compression stockings, which are effective but difficult to apply. There is a remote history of possible congestive heart failure or cardiac event, but she has not had recent cardiology evaluation.  Her diabetes is well controlled, with recent blood glucose readings consistently under 100. She continues  iron  supplementation for anemia.       Review of Systems   Constitutional:  Positive for malaise/fatigue and weight loss. Negative for chills, diaphoresis and fever.  HENT:  Negative for nosebleeds and sore throat.   Eyes:  Negative for double vision.  Respiratory:  Negative for cough, hemoptysis, sputum production, shortness of breath and wheezing.   Cardiovascular:  Negative for chest pain, palpitations, orthopnea and leg swelling.  Gastrointestinal:  Negative for abdominal pain, blood in stool, constipation, diarrhea, heartburn, melena, nausea and vomiting.  Genitourinary:  Negative for dysuria, frequency and urgency.  Musculoskeletal:  Positive for back pain and joint pain.  Skin: Negative.  Negative for itching and rash.  Neurological:  Negative for dizziness, tingling, focal weakness, weakness and headaches.  Endo/Heme/Allergies:  Does not bruise/bleed easily.  Psychiatric/Behavioral:  Negative for depression. The patient is not nervous/anxious and does not have insomnia.     MEDICAL HISTORY:  Past Medical History:  Diagnosis Date   Anemia    CAD (coronary artery disease)    Cataracts, bilateral    Closed compression fracture of first lumbar vertebra (HCC)    Closed compression fracture of second lumbar vertebra (HCC)    Diabetes mellitus without complication (HCC)    High cholesterol    History of pneumonia 2010   [place]   History of uterine fibroid    Hypercalcemia    Hyperlipidemia    Hypertension    Lytic bone lesions on xray    Multiple myeloma (HCC)    Osteoporosis    Primary osteoarthritis of right knee    SVT (supraventricular tachycardia) 05/02/2021    SURGICAL HISTORY: Past Surgical History:  Procedure Laterality Date   CATARACT EXTRACTION Bilateral 2014   COLONOSCOPY  2011   cleared for 5 yrs- [facility]   ECTOPIC PREGNANCY SURGERY      SOCIAL HISTORY: Social History   Socioeconomic History   Marital status: Married    Spouse name: Metallurgist   Number of children: 2   Years of education: Not on file    Highest education level: 12th grade  Occupational History   Occupation: Retired  Tobacco Use   Smoking status: Never    Passive exposure: Never   Smokeless tobacco: Never   Tobacco comments:    smoking cessation materials not required  Vaping Use   Vaping status: Never Used  Substance and Sexual Activity   Alcohol use: No   Drug use: No   Sexual activity: Not Currently  Other Topics Concern   Not on file  Social History Narrative   [place] husband; . Never smoked; no alcohol. Daughters live close.    Social Drivers of Health   Tobacco Use: Low Risk (03/25/2024)   Patient History    Smoking Tobacco Use: Never    Smokeless Tobacco Use: Never    Passive Exposure: Never  Financial Resource Strain: Low Risk (11/25/2023)   Overall Financial Resource Strain (CARDIA)    Difficulty of Paying Living Expenses: Not very hard  Food Insecurity: No Food Insecurity (11/25/2023)   Epic    Worried About Programme Researcher, Broadcasting/film/video in the Last Year: Never true    Ran Out of Food in the Last Year: Never true  Transportation Needs: No Transportation Needs (11/25/2023)   Epic    Lack of Transportation (Medical): No    Lack of Transportation (Non-Medical): No  Physical Activity: Inactive (11/25/2023)   Exercise Vital Sign    Days of Exercise per Week: 0 days  Minutes of Exercise per Session: 0 min  Stress: No Stress Concern Present (11/25/2023)   [employer] of Occupational Health - Occupational Stress Questionnaire    Feeling of Stress: Not at all  Social Connections: Moderately Isolated (11/25/2023)   Social Connection and Isolation Panel    Frequency of Communication with Friends and Family: More than three times a week    Frequency of Social Gatherings with Friends and Family: More than three times a week    Attends Religious Services: Never    Database Administrator or Organizations: No    Attends Banker Meetings: Never    Marital Status: Married  Catering Manager  Violence: Not At Risk (11/25/2023)   Epic    Fear of Current or Ex-Partner: No    Emotionally Abused: No    Physically Abused: No    Sexually Abused: No  Depression (PHQ2-9): Low Risk (03/25/2024)   Depression (PHQ2-9)    PHQ-2 Score: 0  Recent Concern: Depression (PHQ2-9) - Medium Risk (02/26/2024)   Depression (PHQ2-9)    PHQ-2 Score: 5  Alcohol Screen: Low Risk (11/25/2023)   Alcohol Screen    Last Alcohol Screening Score (AUDIT): 0  Housing: Low Risk (11/25/2023)   Epic    Unable to Pay for Housing in the Last Year: No    Number of Times Moved in the Last Year: 0    Homeless in the Last Year: No  Utilities: Not At Risk (11/25/2023)   Epic    Threatened with loss of utilities: No  Health Literacy: Adequate Health Literacy (11/25/2023)   B1300 Health Literacy    Frequency of need for help with medical instructions: Never    FAMILY HISTORY: Family History  Problem Relation Age of Onset   Cancer Mother        breast   Heart disease Mother    Diabetes Father    Heart disease Father    Heart attack Father    Heart attack Brother    Diabetes Brother     ALLERGIES:  is allergic to latex and penicillins.  MEDICATIONS:  Current Outpatient Medications  Medication Sig Dispense Refill   Accu-Chek Softclix Lancets lancets Use as instructed 100 each 0   acetaminophen  (TYLENOL ) 325 MG tablet Take 325 mg by mouth 2 (two) times daily. START 2 days prior to chemo infusion. Take For 2 days; Do NOT take on the day of infusion     acyclovir  (ZOVIRAX ) 400 MG tablet TAKE ONE (1) TABLET BY MOUTH TWICE DAILY 60 tablet 11   aspirin  EC 81 MG tablet Take 81 mg by mouth daily. Swallow whole.     Blood Glucose Monitoring Suppl (ACCU-CHEK GUIDE) w/Device KIT USE AS DIRECTED TO CHECK GLUCOSE     Calcium  Carbonate-Vit D-Min (CALCIUM  1200 PO) Take 1 capsule by mouth daily.     carvedilol  (COREG ) 3.125 MG tablet TAKE ONE (1) TABLET BY MOUTH TWICE DAILY 60 tablet 11   dexamethasone  (DECADRON ) 4 MG  tablet Take 5 tablets (20 mg total) by mouth as directed. Take one hour before monthly Darzalex  injections. 15 tablet 3   diphenoxylate -atropine  (LOMOTIL ) 2.5-0.025 MG tablet TAKE 1 TABLET BY MOUTH FOUR TIMES DAILY AS NEEDED FOR DIARRHEA OR LOOSE STOOLS 60 tablet 3   empagliflozin  (JARDIANCE ) 10 MG TABS tablet Take 1 tablet (10 mg total) by mouth daily. 90 tablet 1   Glucerna (GLUCERNA) LIQD Take 237 mLs by mouth.     glucose blood (ACCU-CHEK GUIDE) test strip Use  as instructed 100 each 0   hydrOXYzine  (ATARAX ) 10 MG tablet Take 1 tablet (10 mg total) by mouth at bedtime as needed. 90 tablet 1   lenalidomide  (REVLIMID ) 10 MG capsule Take 1 capsule (10 mg total) by mouth daily. I capsule daily for 21 days then 7 days off. 21 capsule 0   losartan  (COZAAR ) 25 MG tablet Take 1 tablet (25 mg total) by mouth every evening. 90 tablet 1   lovastatin  (MEVACOR ) 40 MG tablet Take 1 tablet (40 mg total) by mouth every evening. 90 tablet 1   metFORMIN  (GLUCOPHAGE ) 1000 MG tablet Take 1 tablet (1,000 mg total) by mouth 2 (two) times daily. 180 tablet 0   methylPREDNISolone  (MEDROL  DOSEPAK) 4 MG TBPK tablet Use as directed. 21 tablet 1   montelukast  (SINGULAIR ) 10 MG tablet STARTING 2 DAYS BEFORE INFUSION, TAKE 1 TABLET BY MOUTH ONCE DAILY *DO NOT TAKE ON DAY OF INFUSION* 20 tablet 11   ondansetron  (ZOFRAN ) 8 MG tablet One pill every 8 hours as needed for nausea/vomitting. 40 tablet 1   pantoprazole  (PROTONIX ) 40 MG tablet Take 1 tablet (40 mg total) by mouth daily. 90 tablet 1   potassium chloride  SA (KLOR-CON  M) 20 MEQ tablet Take 1 tablet by mouth twice daily 60 tablet 0   prochlorperazine  (COMPAZINE ) 10 MG tablet TAKE 1 TABLET BY MOUTH EVERY 6 HOURS AS NEEDED FOR NAUSEA FOR VOMITING 40 tablet 0   sertraline  (ZOLOFT ) 25 MG tablet TAKE 1 TABLET BY MOUTH DAILY 90 tablet 11   triamcinolone  (NASACORT ) 55 MCG/ACT AERO nasal inhaler 2 sprays 2 (two) times daily.     furosemide  (LASIX ) 20 MG tablet Take 1 tablet (20  mg total) by mouth daily. 30 tablet 3   No current facility-administered medications for this visit.   Facility-Administered Medications Ordered in Other Visits  Medication Dose Route Frequency Provider Last Rate Last Admin   acetaminophen  (TYLENOL ) tablet 650 mg  650 mg Oral Once Lorraine Cimmino R, MD       daratumumab -hyaluronidase -fihj (DARZALEX  FASPRO) 1800-30000 MG-UT/15ML chemo SQ injection 1,800 mg  1,800 mg Subcutaneous Once Frida Wahlstrom R, MD       diphenhydrAMINE  (BENADRYL ) tablet 50 mg  50 mg Oral Once Yousaf Sainato R, MD        PHYSICAL EXAMINATION:  Vitals:   03/25/24 0904  BP: (!) 123/50  Pulse: 69  Resp: 17  Temp: (!) 97.5 F (36.4 C)  SpO2: 98%       Filed Weights   03/25/24 0904  Weight: 132 lb (59.9 kg)        Physical Exam Vitals and nursing note reviewed.  HENT:     Head: Normocephalic and atraumatic.     Mouth/Throat:     Pharynx: Oropharynx is clear.  Eyes:     Extraocular Movements: Extraocular movements intact.     Pupils: Pupils are equal, round, and reactive to light.  Cardiovascular:     Rate and Rhythm: Normal rate and regular rhythm.  Pulmonary:     Comments: Decreased breath sounds bilaterally.  Abdominal:     Palpations: Abdomen is soft.  Musculoskeletal:        General: Normal range of motion.     Cervical back: Normal range of motion.  Skin:    General: Skin is warm.  Neurological:     General: No focal deficit present.     Mental Status: She is alert and oriented to person, place, and time.  Psychiatric:  Behavior: Behavior normal.        Judgment: Judgment normal.      LABORATORY DATA:  I have reviewed the data as listed Lab Results  Component Value Date   WBC 3.9 (L) 03/25/2024   HGB 9.7 (L) 03/25/2024   HCT 31.0 (L) 03/25/2024   MCV 102.0 (H) 03/25/2024   PLT 188 03/25/2024   Recent Labs    01/29/24 0808 02/26/24 0847 03/25/24 0852  NA 136 138 139  K 3.7 4.2 3.7  CL 105 104  107  CO2 21* 24 22  GLUCOSE 206* 127* 191*  BUN 21 15 17   CREATININE 1.13* 1.25* 1.15*  CALCIUM  9.0 9.6 9.2  GFRNONAA 47* 42* 46*  PROT 6.6 6.3* 6.0*  ALBUMIN 4.1 4.0 3.8  AST 19 18 17   ALT 14 12 12   ALKPHOS 51 59 62  BILITOT 0.7 0.4 0.4    RADIOGRAPHIC STUDIES: I have personally reviewed the radiological images as listed and agreed with the findings in the report. DG Chest 2 View Result Date: 02/26/2024 CLINICAL DATA:  Cough and shortness of breath EXAM: CHEST - 2 VIEW COMPARISON:  May 01, 2021 FINDINGS: The heart size and mediastinal contours are within normal limits. Both lungs are clear. Bilateral old rib fractures are noted. IMPRESSION: No active cardiopulmonary disease. Electronically Signed   By: Lynwood Landy Raddle M.D.   On: 02/26/2024 11:49    Multiple myeloma not having achieved remission (HCC) # STAGE I- MULTIPLE MYELOMA -Active [bone lesions; hypercalcemia; anemia; No renal insuffiencey;Bone marrow-32% plasma cell-   STANDARD Cytogenetics- NEG for FISH].  Currently on Revlimid -Dex- Dara SQ. Partial response noted; AUG  2025 M protein = NEG;  K/L RATIO= 1. 6= WNL- stable.   # ok to re-start Dara SQ today + DEX 20 mg PO [at home/pre-meds] Labs today reviewed;  Continue Revlimid  at 10 mg dose 3 weeks on 1 week off- as diarrhea is improved.  Stable.  # # labile BP- recent hypotension- but in general HTN: continue losartan ; continue coreg ; BP- 150-160- refilled coreg ; monitor for now.stable.  Mild fluid overload- Increased BNP- continue lasix  20 mg/day. Refer to [facility] cardiology.   # Hypokalemia severe-DEC 2024  K.2.6 - improved- continue Kdur once a day-  Stable.  # Achy joints/muscle- trial of magnesium  OTC-  Stable.  #Reflux-like symptoms-  Secondary to dexamethasone ; on PPI- continue low-dose 4 mg weekly/WED.  and wants to hold off GI evaluation [per daughter]- stable.     # Anemia-moderate- multifactorial-MM/ IDA-  continue trial of gentle iron . Hb [JAn 2024]- 10-11-  FEB  iron  sat-15-stable.     # Diabetes-on oral hypoglycemic agents; on long acting insulin ; 14 units of lantus  on T/W/Thursday- Defer to PCPstable.     # Hypercalcemia secondary to multiple myeloma- [FEB 2023-Zometa  ; calcitonin]-Zometa  q 80M- stable.   # Weight loss-s/p re-evaluation with Joli -stable.    #DVT/shingles prophylaxis: Aspirin /acyclovirr- -stable.    # Vaccinations; Ok with flu/COVID shot  # IV access:PIV   # ACP-   * [place]- [phone] zometa -q 3 months [12/18/2023]- ; MM; K/L q 4 W  PS # DISPOSITION:  #  Refer to [facility] cardiology re: fluid overload.  #  Dara SQ;    # BNP; MM panel;K/L light chains q 4 weeks- # follow up in 4  weeks/Friday  MD: labs- cbc/cmp;possible- Dara SQ; Zometa  #  follow up in 8   weeks/Friday  MD: labs- cbc/cmp ;possible- Dara SQ;  -Dr.B     All  questions were answered. The patient knows to call the clinic with any problems, questions or concerns.    Cindy JONELLE Joe, MD 03/25/2024 9:42 AM

## 2024-03-28 LAB — MULTIPLE MYELOMA PANEL, SERUM
Albumin SerPl Elph-Mcnc: 3.3 g/dL (ref 2.9–4.4)
Albumin/Glob SerPl: 1.6 (ref 0.7–1.7)
Alpha 1: 0.2 g/dL (ref 0.0–0.4)
Alpha2 Glob SerPl Elph-Mcnc: 0.6 g/dL (ref 0.4–1.0)
B-Globulin SerPl Elph-Mcnc: 0.7 g/dL (ref 0.7–1.3)
Gamma Glob SerPl Elph-Mcnc: 0.6 g/dL (ref 0.4–1.8)
Globulin, Total: 2.2 g/dL (ref 2.2–3.9)
IgA: 106 mg/dL (ref 64–422)
IgG (Immunoglobin G), Serum: 595 mg/dL (ref 586–1602)
IgM (Immunoglobulin M), Srm: 18 mg/dL — ABNORMAL LOW (ref 26–217)
Total Protein ELP: 5.5 g/dL — ABNORMAL LOW (ref 6.0–8.5)

## 2024-03-28 LAB — KAPPA/LAMBDA LIGHT CHAINS
Kappa free light chain: 23.2 mg/L — ABNORMAL HIGH (ref 3.3–19.4)
Kappa, lambda light chain ratio: 1.31 (ref 0.26–1.65)
Lambda free light chains: 17.7 mg/L (ref 5.7–26.3)

## 2024-03-30 ENCOUNTER — Telehealth: Payer: Self-pay | Admitting: *Deleted

## 2024-03-30 NOTE — Telephone Encounter (Signed)
 Felicia at [facility] pharmacy calling [phone] vm. Pt has copay assistance for Revlmid, but it's pending dx verification. Caller is requesting someone to upload the document for diagnosis to the [facility] Portal. We would like to go ahead and get her meds and use the copay amount and help the patient get her medications.

## 2024-03-31 ENCOUNTER — Ambulatory Visit

## 2024-03-31 VITALS — BP 118/62 | HR 58 | Ht 63.0 in | Wt 133.0 lb

## 2024-03-31 DIAGNOSIS — R0609 Other forms of dyspnea: Secondary | ICD-10-CM | POA: Diagnosis not present

## 2024-03-31 DIAGNOSIS — R600 Localized edema: Secondary | ICD-10-CM | POA: Diagnosis not present

## 2024-03-31 NOTE — Progress Notes (Signed)
" °  Cardiology Office Note   Date:  03/31/2024  ID:  Cristina Morgan, DOB January 22, 1935, MRN [id number] PCP: Lemon Raisin, MD  [facility] Providers Cardiologist:  Caron Poser, MD     History of Present Illness Cristina Morgan is a 89 y.o. female PMH multiple myeloma on maintenance therapy who presents for further evaluation and management of volume overload.  Seen by oncologist for this issue 03/25/2024. Pro-BNP 551. Weights have been stable. Last LDL 59 05/2023.  Patient reports she is overall feeling okay from a dyspnea standpoint.  She has occasional dyspnea but it has not changed in frequency.  She is able to lay flat at night and has no orthopnea.  Lower extremity edema has improved with compression stockings.  Relevant CVD History -TTE 04/2021 LVEF 60 to 65%, grade 1 diastolic dysfunction, normal RV size and function, mild to moderate MR, mild to moderate TR -Cardiac cath 03/2014 -results not available for review -Stress cardiac MRI 03/2014 normal biventricular function, no significant LGE, mild perfusion defects apical to basal inferior and anteroseptal walls.  Noted this was not confined to a single coronary territory, so could not differentiate small vessel disease from epicardial CAD.   ROS: Pt denies any chest discomfort, jaw pain, arm pain, palpitations, syncope, presyncope, orthopnea, PND, or LE edema.  Studies Reviewed I have independently reviewed the patient's ECG, previous cardiac testing, previous medical records, previous blood work.  Physical Exam VS:  BP 118/62 (BP Location: Left Arm, Patient Position: Sitting, Cuff Size: Normal)   Pulse (!) 58   Ht 5' 3 (1.6 m)   Wt 133 lb (60.3 kg)   SpO2 97%   BMI 23.56 kg/m        Wt Readings from Last 3 Encounters:  03/31/24 133 lb (60.3 kg)  03/25/24 132 lb (59.9 kg)  02/26/24 131 lb 1.6 oz (59.5 kg)    GEN: No acute distress. NECK: No JVD; No carotid bruits. CARDIAC: RRR, no murmurs, rubs,  gallops. RESPIRATORY:  Clear to auscultation. EXTREMITIES:  Warm and well-perfused. No edema.  ASSESSMENT AND PLAN DOE Lower extremity edema History of multiple myeloma Patient presents with lower extremity edema which has been responsive to low-dose Lasix .  She does have occasional dyspnea on exertion, but pretty minimal.  She is euvolemic on exam.  She is really not having much in the way of heart failure symptoms, if she does have heart failure, it is very mild, NYHA I-II.  Regardless, she is already on treatments for HFpEF.  Plan: - Check echocardiogram; further GDMT pending results.  - Continue Jardiance  10 mg daily; currently takes this for diabetes, but if it turns out that she does actually have heart failure, then she is already on good medical therapy - Euvolemic today.  Continue Lasix  20 mg daily.  Dry weight is 133 pounds. Advised her to double the dose if she gains >3lbs in one day or >5 lbs in one week.  HLD Well-controlled.  Last LDL 50 11/2023.  Continue lovastatin  40 mg daily.        Dispo: RTC 6 months or sooner as needed  Signed, Caron Poser, MD  "

## 2024-03-31 NOTE — Patient Instructions (Signed)
 Medication Instructions:   Your physician recommends that you continue on your current medications as directed. Please refer to the Current Medication list given to you today.   *If you need a refill on your cardiac medications before your next appointment, please call your pharmacy*  Lab Work:  No labs ordered today   If you have labs (blood work) drawn today and your tests are completely normal, you will receive your results only by: MyChart Message (if you have MyChart) OR A paper copy in the mail If you have any lab test that is abnormal or we need to change your treatment, we will call you to review the results.  Testing/Procedures:  Your physician has requested that you have an echocardiogram. Echocardiography is a painless test that uses sound waves to create images of your heart. It provides your doctor with information about the size and shape of your heart and how well your hearts chambers and valves are working.   You may receive an ultrasound enhancing agent through an IV if needed to better visualize your heart during the echo. This procedure takes approximately one hour.  There are no restrictions for this procedure.  This will take place at [address] [address], [place] [address]    Follow-Up: At [facility], you and your health needs are our priority.  As part of our continuing mission to provide you with exceptional heart care, our providers are all part of one team.  This team includes your primary Cardiologist (physician) and Advanced Practice Providers or APPs (Physician Assistants and Nurse Practitioners) who all work together to provide you with the care you need, when you need it.  Your next appointment:   6 month(s)  Provider:   Caron Poser, MD    We recommend signing up for the patient portal called MyChart.  Sign up information is provided on this After Visit Summary.  MyChart is used to connect with patients for  Virtual Visits (Telemedicine).  Patients are able to view lab/test results, encounter notes, upcoming appointments, etc.  Non-urgent messages can be sent to your provider as well.   To learn more about what you can do with MyChart, go to [email or web address].

## 2024-04-01 ENCOUNTER — Other Ambulatory Visit: Payer: Self-pay | Admitting: Student

## 2024-04-01 DIAGNOSIS — Z7984 Long term (current) use of oral hypoglycemic drugs: Secondary | ICD-10-CM

## 2024-04-01 NOTE — Telephone Encounter (Signed)
 Requested Prescriptions  Pending Prescriptions Disp Refills   metFORMIN  (GLUCOPHAGE ) 1000 MG tablet [Pharmacy Med Name: METFORMIN  1000MG  TABLET Tablet] 180 tablet 1    Sig: TAKE 1 TABLET BY MOUTH TWICE DAILY     Endocrinology:  Diabetes - Biguanides Failed - 04/01/2024  3:15 PM      Failed - Cr in normal range and within 360 days    Creatinine  Date Value Ref Range Status  03/25/2024 1.15 (H) 0.44 - 1.00 mg/dL Final         Failed - eGFR in normal range and within 360 days    GFR calc Af Amer  Date Value Ref Range Status  12/02/2019 90 >59 mL/min/1.73 Final    Comment:    **[facility] currently reports eGFR in compliance with the current**   recommendations of the [employer]. [facility] will   update reporting as new guidelines are published from the NKF-ASN   Task force.    GFR, Estimated  Date Value Ref Range Status  03/25/2024 46 (L) >60 mL/min Final    Comment:    (NOTE) Calculated using the CKD-EPI Creatinine Equation (2021)    eGFR  Date Value Ref Range Status  02/01/2021 68 >59 mL/min/1.73 Final         Failed - B12 Level in normal range and within 720 days    Vitamin B-12  Date Value Ref Range Status  05/03/2021 3,868 (H) 180 - 914 pg/mL Final    Comment:    (NOTE) This assay is not validated for testing neonatal or myeloproliferative syndrome specimens for Vitamin B12 levels. Performed at [facility], [address] [place], [place] [address]          Failed - CBC within normal limits and completed in the last 12 months    WBC  Date Value Ref Range Status  03/25/2024 3.9 (L) 4.0 - 10.5 K/uL Final   RBC  Date Value Ref Range Status  03/25/2024 3.04 (L) 3.87 - 5.11 MIL/uL Final   Hemoglobin  Date Value Ref Range Status  03/25/2024 9.7 (L) 12.0 - 15.0 g/dL Final  88/85/7974 89.3 (L) 12.0 - 15.0 g/dL Final  98/69/7976 9.5 (L) 11.1 - 15.9 g/dL Final   HCT  Date Value Ref Range Status  03/25/2024 31.0 (L) 36.0 - 46.0 % Final    Hematocrit  Date Value Ref Range Status  04/15/2021 29.8 (L) 34.0 - 46.6 % Final   MCHC  Date Value Ref Range Status  03/25/2024 31.3 30.0 - 36.0 g/dL Final   [facility]  Date Value Ref Range Status  03/25/2024 31.9 26.0 - 34.0 pg Final   MCV  Date Value Ref Range Status  03/25/2024 102.0 (H) 80.0 - 100.0 fL Final  04/15/2021 98 (H) 79 - 97 fL Final   No results found for: PLTCOUNTKUC, LABPLAT, POCPLA RDW  Date Value Ref Range Status  03/25/2024 14.0 11.5 - 15.5 % Final  04/15/2021 11.9 11.7 - 15.4 % Final         Passed - HBA1C is between 0 and 7.9 and within 180 days    Hemoglobin A1C  Date Value Ref Range Status  01/11/2024 7.7 (A) 4.0 - 5.6 % Final   Hgb A1c MFr Bld  Date Value Ref Range Status  05/21/2023 7.8 (H) 4.8 - 5.6 % Final    Comment:             Prediabetes: 5.7 - 6.4  Diabetes: >6.4          Glycemic control for adults with diabetes: <7.0          Passed - Valid encounter within last 6 months    Recent Outpatient Visits           1 month ago Essential hypertension   [facility], Harlene, MD   2 months ago Diabetes mellitus treated with insulin  and oral medication [facility])   [facility] at [facility], Harlene, MD   10 months ago Diabetes mellitus treated with insulin  and oral medication [facility])   [facility] at [facility] Cathryne JAYSON, MD

## 2024-04-13 ENCOUNTER — Ambulatory Visit: Admitting: Student

## 2024-04-20 ENCOUNTER — Ambulatory Visit

## 2024-04-20 DIAGNOSIS — R0609 Other forms of dyspnea: Secondary | ICD-10-CM

## 2024-04-20 DIAGNOSIS — R600 Localized edema: Secondary | ICD-10-CM | POA: Diagnosis not present

## 2024-04-20 LAB — ECHOCARDIOGRAM COMPLETE
AR max vel: 2.96 cm2
AV Area VTI: 2.98 cm2
AV Area mean vel: 2.84 cm2
AV Mean grad: 3 mmHg
AV Peak grad: 6.1 mmHg
AV Vena cont: 0.3 cm
Ao pk vel: 1.23 m/s
Area-P 1/2: 2.39 cm2
MV M vel: 4.29 m/s
MV Peak grad: 73.6 mmHg
MV VTI: 2.22 cm2
P 1/2 time: 425 ms
S' Lateral: 2.9 cm

## 2024-04-21 ENCOUNTER — Telehealth: Payer: Self-pay | Admitting: Internal Medicine

## 2024-04-21 ENCOUNTER — Other Ambulatory Visit: Payer: Self-pay | Admitting: Internal Medicine

## 2024-04-21 ENCOUNTER — Ambulatory Visit: Payer: Self-pay

## 2024-04-21 NOTE — Telephone Encounter (Signed)
 Patient daughter called and would like to move her treatment to next Friday 2/13. I explained that her treatment plan would need to be updated and someone would call her with the new appts.

## 2024-04-22 ENCOUNTER — Inpatient Hospital Stay

## 2024-04-22 ENCOUNTER — Telehealth: Payer: Self-pay | Admitting: Internal Medicine

## 2024-04-22 ENCOUNTER — Inpatient Hospital Stay: Admitting: Internal Medicine

## 2024-04-22 NOTE — Telephone Encounter (Signed)
 Pt daughter called to confirm new appt date/time - LH

## 2024-04-29 ENCOUNTER — Inpatient Hospital Stay

## 2024-04-29 ENCOUNTER — Inpatient Hospital Stay: Admitting: Nurse Practitioner

## 2024-05-20 ENCOUNTER — Inpatient Hospital Stay

## 2024-05-20 ENCOUNTER — Inpatient Hospital Stay: Admitting: Internal Medicine

## 2024-05-27 ENCOUNTER — Inpatient Hospital Stay: Admitting: Internal Medicine

## 2024-05-27 ENCOUNTER — Inpatient Hospital Stay

## 2024-11-30 ENCOUNTER — Ambulatory Visit
